# Patient Record
Sex: Female | Born: 1985 | Race: Black or African American | Hispanic: No | Smoking: Never smoker
Health system: Southern US, Community
[De-identification: ages and names within clinical notes are randomized; demographics above are authoritative.]

## PROBLEM LIST (undated history)

## (undated) DIAGNOSIS — R079 Chest pain, unspecified: Secondary | ICD-10-CM

## (undated) DIAGNOSIS — R569 Unspecified convulsions: Secondary | ICD-10-CM

## (undated) DIAGNOSIS — E669 Obesity, unspecified: Secondary | ICD-10-CM

## (undated) DIAGNOSIS — J45909 Unspecified asthma, uncomplicated: Secondary | ICD-10-CM

## (undated) DIAGNOSIS — I10 Essential (primary) hypertension: Secondary | ICD-10-CM

## (undated) DIAGNOSIS — F319 Bipolar disorder, unspecified: Secondary | ICD-10-CM

## (undated) DIAGNOSIS — F329 Major depressive disorder, single episode, unspecified: Secondary | ICD-10-CM

## (undated) DIAGNOSIS — I471 Supraventricular tachycardia, unspecified: Secondary | ICD-10-CM

## (undated) DIAGNOSIS — F32A Depression, unspecified: Secondary | ICD-10-CM

## (undated) DIAGNOSIS — M797 Fibromyalgia: Secondary | ICD-10-CM

## (undated) DIAGNOSIS — I2699 Other pulmonary embolism without acute cor pulmonale: Secondary | ICD-10-CM

## (undated) DIAGNOSIS — I509 Heart failure, unspecified: Secondary | ICD-10-CM

## (undated) DIAGNOSIS — R7303 Prediabetes: Secondary | ICD-10-CM

## (undated) DIAGNOSIS — F419 Anxiety disorder, unspecified: Secondary | ICD-10-CM

## (undated) DIAGNOSIS — M5416 Radiculopathy, lumbar region: Secondary | ICD-10-CM

## (undated) HISTORY — DX: Supraventricular tachycardia, unspecified: I47.10

## (undated) HISTORY — DX: Heart failure, unspecified: I50.9

## (undated) HISTORY — DX: Depression, unspecified: F32.A

## (undated) HISTORY — DX: Radiculopathy, lumbar region: M54.16

## (undated) HISTORY — DX: Obesity, unspecified: E66.9

## (undated) HISTORY — DX: Unspecified convulsions: R56.9

## (undated) HISTORY — DX: Fibromyalgia: M79.7

## (undated) HISTORY — DX: Bipolar disorder, unspecified: F31.9

## (undated) HISTORY — DX: Supraventricular tachycardia: I47.1

## (undated) HISTORY — DX: Anxiety disorder, unspecified: F41.9

## (undated) HISTORY — DX: Chest pain, unspecified: R07.9

## (undated) HISTORY — DX: Major depressive disorder, single episode, unspecified: F32.9

---
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
## 2009-01-20 ENCOUNTER — Emergency Department: Payer: Self-pay | Admitting: Emergency Medicine

## 2009-05-11 ENCOUNTER — Emergency Department: Payer: Self-pay | Admitting: Emergency Medicine

## 2009-07-13 DIAGNOSIS — I471 Supraventricular tachycardia: Secondary | ICD-10-CM | POA: Insufficient documentation

## 2009-07-18 ENCOUNTER — Emergency Department: Payer: Self-pay | Admitting: Emergency Medicine

## 2009-08-16 ENCOUNTER — Encounter: Payer: Self-pay | Admitting: Orthopaedic Surgery

## 2009-08-27 ENCOUNTER — Encounter: Payer: Self-pay | Admitting: Orthopaedic Surgery

## 2009-10-11 ENCOUNTER — Emergency Department: Payer: Self-pay | Admitting: Emergency Medicine

## 2009-10-22 ENCOUNTER — Emergency Department: Payer: Self-pay | Admitting: Emergency Medicine

## 2009-10-28 ENCOUNTER — Emergency Department: Payer: Self-pay | Admitting: Internal Medicine

## 2009-11-02 ENCOUNTER — Emergency Department: Payer: Self-pay | Admitting: Emergency Medicine

## 2009-11-19 ENCOUNTER — Observation Stay: Payer: Self-pay | Admitting: Specialist

## 2010-01-23 ENCOUNTER — Emergency Department: Payer: Self-pay | Admitting: Emergency Medicine

## 2010-03-25 ENCOUNTER — Emergency Department: Payer: Self-pay | Admitting: Emergency Medicine

## 2010-09-19 ENCOUNTER — Emergency Department: Payer: Self-pay | Admitting: Emergency Medicine

## 2011-03-24 ENCOUNTER — Emergency Department: Payer: Self-pay | Admitting: Emergency Medicine

## 2011-04-17 ENCOUNTER — Emergency Department: Payer: Self-pay | Admitting: Internal Medicine

## 2011-04-24 ENCOUNTER — Inpatient Hospital Stay: Payer: Self-pay | Admitting: Unknown Physician Specialty

## 2011-05-23 ENCOUNTER — Encounter: Payer: Self-pay | Admitting: Family Medicine

## 2011-05-28 ENCOUNTER — Encounter: Payer: Self-pay | Admitting: Family Medicine

## 2011-12-13 ENCOUNTER — Emergency Department: Payer: Self-pay | Admitting: Emergency Medicine

## 2011-12-19 ENCOUNTER — Emergency Department: Payer: Self-pay | Admitting: Emergency Medicine

## 2012-09-24 ENCOUNTER — Emergency Department: Payer: Self-pay | Admitting: Emergency Medicine

## 2013-01-15 ENCOUNTER — Emergency Department: Payer: Self-pay | Admitting: Internal Medicine

## 2013-05-09 ENCOUNTER — Ambulatory Visit: Payer: Self-pay | Admitting: Neurology

## 2013-05-22 ENCOUNTER — Ambulatory Visit: Payer: Self-pay | Admitting: Neurology

## 2013-06-13 ENCOUNTER — Emergency Department: Payer: Self-pay | Admitting: Emergency Medicine

## 2013-06-13 LAB — CBC
HCT: 39 % (ref 35.0–47.0)
HGB: 12.9 g/dL (ref 12.0–16.0)
MCH: 27.2 pg (ref 26.0–34.0)
MCHC: 33.1 g/dL (ref 32.0–36.0)
MCV: 82 fL (ref 80–100)
Platelet: 249 10*3/uL (ref 150–440)
RBC: 4.76 10*6/uL (ref 3.80–5.20)
RDW: 16.9 % — ABNORMAL HIGH (ref 11.5–14.5)
WBC: 7.6 10*3/uL (ref 3.6–11.0)

## 2013-06-13 LAB — BASIC METABOLIC PANEL
Anion Gap: 9 (ref 7–16)
BUN: 7 mg/dL (ref 7–18)
Calcium, Total: 9.7 mg/dL (ref 8.5–10.1)
Chloride: 104 mmol/L (ref 98–107)
Co2: 26 mmol/L (ref 21–32)
Creatinine: 0.78 mg/dL (ref 0.60–1.30)
EGFR (African American): >60
EGFR (Non-African Amer.): >60
Glucose: 161 mg/dL — ABNORMAL HIGH (ref 65–99)
Osmolality: 279 (ref 275–301)
Potassium: 3.7 mmol/L (ref 3.5–5.1)
Sodium: 139 mmol/L (ref 136–145)

## 2013-06-13 LAB — TROPONIN I: Troponin-I: <0.02 ng/mL

## 2013-06-14 LAB — TSH: Thyroid Stimulating Horm: 0.266 u[IU]/mL — ABNORMAL LOW

## 2014-04-10 ENCOUNTER — Emergency Department: Payer: Self-pay | Admitting: Emergency Medicine

## 2014-04-15 DIAGNOSIS — R519 Headache, unspecified: Secondary | ICD-10-CM | POA: Insufficient documentation

## 2014-04-15 DIAGNOSIS — G2581 Restless legs syndrome: Secondary | ICD-10-CM | POA: Insufficient documentation

## 2014-04-15 DIAGNOSIS — E669 Obesity, unspecified: Secondary | ICD-10-CM | POA: Insufficient documentation

## 2014-04-15 DIAGNOSIS — R51 Headache: Secondary | ICD-10-CM

## 2014-04-15 DIAGNOSIS — G479 Sleep disorder, unspecified: Secondary | ICD-10-CM | POA: Insufficient documentation

## 2014-04-15 DIAGNOSIS — M542 Cervicalgia: Secondary | ICD-10-CM | POA: Insufficient documentation

## 2014-04-27 DIAGNOSIS — G43909 Migraine, unspecified, not intractable, without status migrainosus: Secondary | ICD-10-CM | POA: Insufficient documentation

## 2014-04-27 DIAGNOSIS — M797 Fibromyalgia: Secondary | ICD-10-CM | POA: Insufficient documentation

## 2014-04-27 DIAGNOSIS — G4733 Obstructive sleep apnea (adult) (pediatric): Secondary | ICD-10-CM | POA: Insufficient documentation

## 2014-04-27 DIAGNOSIS — R569 Unspecified convulsions: Secondary | ICD-10-CM | POA: Insufficient documentation

## 2014-06-23 ENCOUNTER — Ambulatory Visit: Payer: Self-pay | Admitting: Family Medicine

## 2014-09-07 ENCOUNTER — Emergency Department: Payer: Self-pay | Admitting: Emergency Medicine

## 2014-09-07 LAB — CBC
HCT: 38.2 % (ref 35.0–47.0)
HGB: 12.3 g/dL (ref 12.0–16.0)
MCH: 27.4 pg (ref 26.0–34.0)
MCHC: 32.2 g/dL (ref 32.0–36.0)
MCV: 85 fL (ref 80–100)
Platelet: 241 10*3/uL (ref 150–440)
RBC: 4.5 10*6/uL (ref 3.80–5.20)
RDW: 13.8 % (ref 11.5–14.5)
WBC: 4.3 10*3/uL (ref 3.6–11.0)

## 2014-09-07 LAB — COMPREHENSIVE METABOLIC PANEL
Albumin: 3.5 g/dL (ref 3.4–5.0)
Alkaline Phosphatase: 58 U/L
Anion Gap: 8 (ref 7–16)
BUN: 9 mg/dL (ref 7–18)
Bilirubin,Total: 0.1 mg/dL — ABNORMAL LOW (ref 0.2–1.0)
Calcium, Total: 8.5 mg/dL (ref 8.5–10.1)
Chloride: 107 mmol/L (ref 98–107)
Co2: 27 mmol/L (ref 21–32)
Creatinine: 0.73 mg/dL (ref 0.60–1.30)
EGFR (African American): >60
EGFR (Non-African Amer.): >60
Glucose: 92 mg/dL (ref 65–99)
Osmolality: 281 (ref 275–301)
Potassium: 3.9 mmol/L (ref 3.5–5.1)
SGOT(AST): 11 U/L — ABNORMAL LOW (ref 15–37)
SGPT (ALT): 11 U/L — ABNORMAL LOW
Sodium: 142 mmol/L (ref 136–145)
Total Protein: 6.8 g/dL (ref 6.4–8.2)

## 2014-09-07 LAB — TROPONIN I: Troponin-I: <0.02 ng/mL

## 2014-09-09 ENCOUNTER — Emergency Department: Payer: Self-pay | Admitting: Emergency Medicine

## 2014-09-10 ENCOUNTER — Ambulatory Visit: Payer: Self-pay | Admitting: Family Medicine

## 2015-04-01 ENCOUNTER — Other Ambulatory Visit: Payer: Self-pay | Admitting: Physical Medicine and Rehabilitation

## 2015-04-01 DIAGNOSIS — M5416 Radiculopathy, lumbar region: Secondary | ICD-10-CM

## 2015-04-15 ENCOUNTER — Ambulatory Visit: Admission: RE | Admit: 2015-04-15 | Payer: Self-pay | Source: Ambulatory Visit

## 2015-04-29 ENCOUNTER — Other Ambulatory Visit: Payer: Self-pay | Admitting: Family Medicine

## 2015-04-29 ENCOUNTER — Ambulatory Visit
Admission: RE | Admit: 2015-04-29 | Discharge: 2015-04-29 | Disposition: A | Discharge status: Home / Self Care | Payer: Self-pay | Source: Ambulatory Visit | Attending: Family Medicine | Admitting: Family Medicine

## 2015-04-29 DIAGNOSIS — M25561 Pain in right knee: Secondary | ICD-10-CM | POA: Insufficient documentation

## 2015-04-29 DIAGNOSIS — M25569 Pain in unspecified knee: Secondary | ICD-10-CM

## 2015-05-12 ENCOUNTER — Ambulatory Visit: Payer: Self-pay | Attending: Family Medicine

## 2015-05-12 VITALS — BP 111/69 | HR 75

## 2015-05-12 DIAGNOSIS — M5416 Radiculopathy, lumbar region: Secondary | ICD-10-CM

## 2015-05-12 DIAGNOSIS — M5442 Lumbago with sciatica, left side: Secondary | ICD-10-CM

## 2015-05-12 DIAGNOSIS — R531 Weakness: Secondary | ICD-10-CM

## 2015-05-12 NOTE — Patient Instructions (Signed)
Per pt, she has access to a pool. Patient was instructed to walk forward for 15 minutes, sideways for 15 minutes, and backwards for 15 minutes in a pool and to perform comfortably.  Patient was also instructed to perform supine R hip extension isometrics (10x3 with 5 second holds); supine L hip flexion 10x3; and standing L lateral shift correction (R lateral shift correction was uncomfortable for pt) 5x5 seconds for 3 sets as part of her home exercise program. Patient demonstrated and verbalized understanding.   Patient to call back after 3 weeks to schedule a follow-up session if needed secondary to current insurance situation and possible out of pocket expenses (patient informed). Patient verbalized understanding.     Improved exercise technique, movement at target joints, use of target muscles after mod verbal, visual, tactile cues.
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
## 2015-05-18 ENCOUNTER — Ambulatory Visit: Payer: Self-pay

## 2015-05-18 ENCOUNTER — Ambulatory Visit
Admission: RE | Admit: 2015-05-18 | Discharge: 2015-05-18 | Disposition: A | Discharge status: Home / Self Care | Payer: Self-pay | Source: Ambulatory Visit | Attending: Physical Medicine and Rehabilitation | Admitting: Physical Medicine and Rehabilitation

## 2015-05-18 ENCOUNTER — Ambulatory Visit: Payer: Self-pay | Admitting: Physical Therapy

## 2015-05-18 DIAGNOSIS — M5416 Radiculopathy, lumbar region: Secondary | ICD-10-CM

## 2015-06-15 ENCOUNTER — Ambulatory Visit: Admission: RE | Admit: 2015-06-15 | Payer: Self-pay | Source: Ambulatory Visit

## 2015-07-13 ENCOUNTER — Ambulatory Visit
Admission: RE | Admit: 2015-07-13 | Discharge: 2015-07-13 | Disposition: A | Discharge status: Home / Self Care | Payer: Self-pay | Source: Ambulatory Visit | Attending: Physical Medicine and Rehabilitation | Admitting: Physical Medicine and Rehabilitation

## 2015-07-13 DIAGNOSIS — M5117 Intervertebral disc disorders with radiculopathy, lumbosacral region: Secondary | ICD-10-CM | POA: Insufficient documentation

## 2015-07-17 ENCOUNTER — Emergency Department
Admission: EM | Admit: 2015-07-17 | Discharge: 2015-07-17 | Disposition: A | Discharge status: Home / Self Care | Payer: Self-pay | Attending: Emergency Medicine | Admitting: Emergency Medicine

## 2015-07-17 DIAGNOSIS — Z791 Long term (current) use of non-steroidal anti-inflammatories (NSAID): Secondary | ICD-10-CM | POA: Insufficient documentation

## 2015-07-17 DIAGNOSIS — Z79899 Other long term (current) drug therapy: Secondary | ICD-10-CM | POA: Insufficient documentation

## 2015-07-17 DIAGNOSIS — M5416 Radiculopathy, lumbar region: Secondary | ICD-10-CM | POA: Insufficient documentation

## 2015-07-17 DIAGNOSIS — R200 Anesthesia of skin: Secondary | ICD-10-CM | POA: Insufficient documentation

## 2015-07-17 DIAGNOSIS — I10 Essential (primary) hypertension: Secondary | ICD-10-CM | POA: Insufficient documentation

## 2015-07-17 HISTORY — DX: Essential (primary) hypertension: I10

## 2015-07-17 MED ORDER — HYDROCODONE-ACETAMINOPHEN 5-325 MG PO TABS
1.0000 | ORAL_TABLET | ORAL | Status: AC
  Administered 2015-07-17: 1 via ORAL
  Filled 2015-07-17: qty 1

## 2015-07-17 MED ORDER — DEXAMETHASONE SODIUM PHOSPHATE 10 MG/ML IJ SOLN
10.0000 mg | Freq: Once | INTRAMUSCULAR | Status: AC
  Administered 2015-07-17: 10 mg via INTRAMUSCULAR
  Filled 2015-07-17: qty 1

## 2015-07-17 MED ORDER — PREDNISONE 10 MG PO TABS: 10.0000 mg | ORAL_TABLET | Freq: Every day | ORAL | Status: DC

## 2015-07-17 NOTE — Discharge Instructions (Signed)

## 2015-07-17 NOTE — ED Notes (Signed)
Pt states that her back pain has got worse today and goes down her legs. Pt had an MRI on Monday and states she has a pinched nerve.
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
## 2015-07-19 ENCOUNTER — Ambulatory Visit: Payer: Self-pay | Admitting: Physical Therapy

## 2015-07-21 ENCOUNTER — Ambulatory Visit: Payer: Self-pay | Admitting: Physical Therapy

## 2015-07-21 ENCOUNTER — Encounter: Payer: Self-pay | Admitting: Physical Therapy

## 2015-07-28 ENCOUNTER — Ambulatory Visit: Payer: Self-pay | Admitting: Physical Therapy

## 2015-07-28 ENCOUNTER — Encounter: Payer: Self-pay | Admitting: Physical Therapy

## 2015-07-30 ENCOUNTER — Encounter: Payer: Self-pay | Admitting: Physical Therapy

## 2015-08-03 ENCOUNTER — Encounter: Payer: Self-pay | Admitting: Physical Therapy

## 2015-08-05 ENCOUNTER — Ambulatory Visit: Payer: Self-pay | Attending: Family Medicine | Admitting: Physical Therapy

## 2015-08-11 ENCOUNTER — Encounter: Payer: Self-pay | Admitting: Physical Therapy

## 2015-08-18 ENCOUNTER — Encounter: Payer: Self-pay | Admitting: Physical Therapy

## 2015-08-21 ENCOUNTER — Encounter: Payer: Self-pay | Admitting: Emergency Medicine

## 2015-08-21 ENCOUNTER — Emergency Department
Admission: EM | Admit: 2015-08-21 | Discharge: 2015-08-21 | Disposition: A | Discharge status: Home / Self Care | Payer: Self-pay | Attending: Emergency Medicine | Admitting: Emergency Medicine

## 2015-08-21 DIAGNOSIS — I10 Essential (primary) hypertension: Secondary | ICD-10-CM | POA: Insufficient documentation

## 2015-08-21 DIAGNOSIS — Z791 Long term (current) use of non-steroidal anti-inflammatories (NSAID): Secondary | ICD-10-CM | POA: Insufficient documentation

## 2015-08-21 DIAGNOSIS — Z79899 Other long term (current) drug therapy: Secondary | ICD-10-CM | POA: Insufficient documentation

## 2015-08-21 DIAGNOSIS — Z7952 Long term (current) use of systemic steroids: Secondary | ICD-10-CM | POA: Insufficient documentation

## 2015-08-21 DIAGNOSIS — M545 Low back pain: Secondary | ICD-10-CM | POA: Insufficient documentation

## 2015-08-21 MED ORDER — TRAMADOL HCL 50 MG PO TABS: 50.0000 mg | ORAL_TABLET | Freq: Four times a day (QID) | ORAL | Status: DC | PRN

## 2015-08-21 MED ORDER — TRAMADOL HCL 50 MG PO TABS
ORAL_TABLET | ORAL | Status: AC
  Filled 2015-08-21: qty 1

## 2015-08-21 MED ORDER — NAPROXEN 500 MG PO TABS: 500.0000 mg | ORAL_TABLET | Freq: Two times a day (BID) | ORAL | Status: DC

## 2015-08-21 MED ORDER — TRAMADOL HCL 50 MG PO TABS
50.0000 mg | ORAL_TABLET | Freq: Once | ORAL | Status: AC
  Administered 2015-08-21: 50 mg via ORAL

## 2015-08-21 MED ORDER — ONDANSETRON HCL 4 MG PO TABS: 4.0000 mg | ORAL_TABLET | Freq: Every day | ORAL | Status: DC | PRN

## 2015-08-21 MED ORDER — ONDANSETRON 8 MG PO TBDP
8.0000 mg | ORAL_TABLET | Freq: Once | ORAL | Status: AC
  Administered 2015-08-21: 8 mg via ORAL

## 2015-08-21 MED ORDER — ONDANSETRON 8 MG PO TBDP
ORAL_TABLET | ORAL | Status: AC
  Administered 2015-08-21: 8 mg via ORAL
  Filled 2015-08-21: qty 1

## 2015-08-21 NOTE — Discharge Instructions (Signed)
Back Pain, Adult °Back pain is very common. The pain often gets better over time. The cause of back pain is usually not dangerous. Most people can learn to manage their back pain on their own.  °HOME CARE  °· Stay active. Start with short walks on flat ground if you can. Try to walk farther each day. °· Do not sit, drive, or stand in one place for more than 30 minutes. Do not stay in bed. °· Do not avoid exercise or work. Activity can help your back heal faster. °· Be careful when you bend or lift an object. Bend at your knees, keep the object close to you, and do not twist. °· Sleep on a firm mattress. Lie on your side, and bend your knees. If you lie on your back, put a pillow under your knees. °· Only take medicines as told by your doctor. °· Put ice on the injured area. °¨ Put ice in a plastic bag. °¨ Place a towel between your skin and the bag. °¨ Leave the ice on for 15-20 minutes, 03-04 times a day for the first 2 to 3 days. After that, you can switch between ice and heat packs. °· Ask your doctor about back exercises or massage. °· Avoid feeling anxious or stressed. Find good ways to deal with stress, such as exercise. °GET HELP RIGHT AWAY IF:  °· Your pain does not go away with rest or medicine. °· Your pain does not go away in 1 week. °· You have new problems. °· You do not feel well. °· The pain spreads into your legs. °· You cannot control when you poop (bowel movement) or pee (urinate). °· Your arms or legs feel weak or lose feeling (numbness). °· You feel sick to your stomach (nauseous) or throw up (vomit). °· You have belly (abdominal) pain. °· You feel like you may pass out (faint). °MAKE SURE YOU:  °· Understand these instructions. °· Will watch your condition. °· Will get help right away if you are not doing well or get worse. °Document Released: 05/01/2008 Document Revised: 02/05/2012 Document Reviewed: 03/17/2014 °[employer]® Patient Information ©2015 [employer]. This information is not intended  to replace advice given to you by your health care provider. Make sure you discuss any questions you have with your health care provider. ° °

## 2015-08-21 NOTE — ED Notes (Signed)
States has hisotry of "bulging disc", states had box fall on back 2 months ago and has had low back pain since, states has been evaluated for this in past
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
## 2015-10-16 ENCOUNTER — Emergency Department
Admission: EM | Admit: 2015-10-16 | Discharge: 2015-10-16 | Disposition: A | Discharge status: Home / Self Care | Payer: Self-pay | Attending: Emergency Medicine | Admitting: Emergency Medicine

## 2015-10-16 ENCOUNTER — Emergency Department: Payer: Self-pay

## 2015-10-16 ENCOUNTER — Encounter: Payer: Self-pay | Admitting: Emergency Medicine

## 2015-10-16 DIAGNOSIS — Z79899 Other long term (current) drug therapy: Secondary | ICD-10-CM | POA: Insufficient documentation

## 2015-10-16 DIAGNOSIS — Z3202 Encounter for pregnancy test, result negative: Secondary | ICD-10-CM | POA: Insufficient documentation

## 2015-10-16 DIAGNOSIS — R079 Chest pain, unspecified: Secondary | ICD-10-CM | POA: Insufficient documentation

## 2015-10-16 DIAGNOSIS — B349 Viral infection, unspecified: Secondary | ICD-10-CM

## 2015-10-16 DIAGNOSIS — J020 Streptococcal pharyngitis: Secondary | ICD-10-CM

## 2015-10-16 DIAGNOSIS — I10 Essential (primary) hypertension: Secondary | ICD-10-CM | POA: Insufficient documentation

## 2015-10-16 DIAGNOSIS — Z791 Long term (current) use of non-steroidal anti-inflammatories (NSAID): Secondary | ICD-10-CM | POA: Insufficient documentation

## 2015-10-16 LAB — CBC WITH DIFFERENTIAL/PLATELET
Basophils Absolute: 0.1 10*3/uL (ref 0–0.1)
Basophils Relative: 1 %
Eosinophils Absolute: 0 10*3/uL (ref 0–0.7)
Eosinophils Relative: 0 %
HCT: 38.9 % (ref 35.0–47.0)
Hemoglobin: 12.5 g/dL (ref 12.0–16.0)
Lymphocytes Relative: 19 %
Lymphs Abs: 2.4 10*3/uL (ref 1.0–3.6)
MCH: 26.2 pg (ref 26.0–34.0)
MCHC: 32 g/dL (ref 32.0–36.0)
MCV: 82 fL (ref 80.0–100.0)
Monocytes Absolute: 1.6 10*3/uL — ABNORMAL HIGH (ref 0.2–0.9)
Monocytes Relative: 13 %
Neutro Abs: 8.3 10*3/uL — ABNORMAL HIGH (ref 1.4–6.5)
Neutrophils Relative %: 67 %
Platelets: 227 10*3/uL (ref 150–440)
RBC: 4.75 MIL/uL (ref 3.80–5.20)
RDW: 13.7 % (ref 11.5–14.5)
WBC: 12.3 10*3/uL — ABNORMAL HIGH (ref 3.6–11.0)

## 2015-10-16 LAB — BASIC METABOLIC PANEL
Anion gap: 9 (ref 5–15)
BUN: 6 mg/dL (ref 6–20)
CO2: 27 mmol/L (ref 22–32)
Calcium: 9.4 mg/dL (ref 8.9–10.3)
Chloride: 99 mmol/L — ABNORMAL LOW (ref 101–111)
Creatinine, Ser: 0.76 mg/dL (ref 0.44–1.00)
GFR calc Af Amer: >60 mL/min (ref >60)
GFR calc non Af Amer: >60 mL/min (ref >60)
Glucose, Bld: 115 mg/dL — ABNORMAL HIGH (ref 65–99)
Potassium: 3.6 mmol/L (ref 3.5–5.1)
Sodium: 135 mmol/L (ref 135–145)

## 2015-10-16 LAB — URINALYSIS COMPLETE WITH MICROSCOPIC (ARMC ONLY)
Bilirubin Urine: NEGATIVE
Glucose, UA: NEGATIVE mg/dL
Hgb urine dipstick: NEGATIVE
Leukocytes, UA: NEGATIVE
Nitrite: NEGATIVE
Protein, ur: NEGATIVE mg/dL
Specific Gravity, Urine: 1.004 — ABNORMAL LOW (ref 1.005–1.030)
pH: 6 (ref 5.0–8.0)

## 2015-10-16 LAB — MONONUCLEOSIS SCREEN: Mono Screen: NEGATIVE

## 2015-10-16 LAB — FIBRIN DERIVATIVES D-DIMER (ARMC ONLY): Fibrin derivatives D-dimer (ARMC): 565 — ABNORMAL HIGH (ref 0–499)

## 2015-10-16 LAB — POCT PREGNANCY, URINE: Preg Test, Ur: NEGATIVE

## 2015-10-16 LAB — TROPONIN I: Troponin I: <0.03 ng/mL (ref <0.031)

## 2015-10-16 MED ORDER — ONDANSETRON HCL 4 MG/2ML IJ SOLN
INTRAMUSCULAR | Status: AC
  Filled 2015-10-16: qty 2

## 2015-10-16 MED ORDER — IOHEXOL 350 MG/ML SOLN
80.0000 mL | Freq: Once | INTRAVENOUS | Status: AC | PRN
  Administered 2015-10-16: 80 mL via INTRAVENOUS

## 2015-10-16 MED ORDER — ONDANSETRON HCL 4 MG/2ML IJ SOLN
4.0000 mg | Freq: Once | INTRAMUSCULAR | Status: AC
  Administered 2015-10-16: 4 mg via INTRAVENOUS
  Filled 2015-10-16: qty 2

## 2015-10-16 MED ORDER — METOCLOPRAMIDE HCL 10 MG PO TABS: 10.0000 mg | ORAL_TABLET | Freq: Four times a day (QID) | ORAL | Status: DC | PRN

## 2015-10-16 MED ORDER — KETOROLAC TROMETHAMINE 30 MG/ML IJ SOLN
30.0000 mg | Freq: Once | INTRAMUSCULAR | Status: AC
  Administered 2015-10-16: 30 mg via INTRAVENOUS
  Filled 2015-10-16: qty 1

## 2015-10-16 MED ORDER — SODIUM CHLORIDE 0.9 % IV BOLUS (SEPSIS)
1000.0000 mL | Freq: Once | INTRAVENOUS | Status: AC
  Administered 2015-10-16: 1000 mL via INTRAVENOUS

## 2015-10-16 MED ORDER — IOHEXOL 350 MG/ML SOLN
100.0000 mL | Freq: Once | INTRAVENOUS | Status: AC | PRN
  Administered 2015-10-16: 80 mL via INTRAVENOUS

## 2015-10-16 MED ORDER — PENICILLIN G PROCAINE 600000 UNIT/ML IM SUSP
INTRAMUSCULAR | Status: AC
  Filled 2015-10-16: qty 1

## 2015-10-16 MED ORDER — PENICILLIN G BENZATHINE 1200000 UNIT/2ML IM SUSP
INTRAMUSCULAR | Status: AC
  Filled 2015-10-16: qty 4

## 2015-10-16 MED ORDER — ACETAMINOPHEN 325 MG PO TABS
650.0000 mg | ORAL_TABLET | Freq: Once | ORAL | Status: AC
  Administered 2015-10-16: 650 mg via ORAL
  Filled 2015-10-16: qty 2

## 2015-10-16 MED ORDER — PENICILLIN G BENZATHINE 1200000 UNIT/2ML IM SUSP
1.2000 10*6.[IU] | Freq: Once | INTRAMUSCULAR | Status: AC
Start: 2015-10-16 — End: 2015-10-16
  Administered 2015-10-16: 1.2 10*6.[IU] via INTRAMUSCULAR

## 2015-10-16 MED ORDER — ONDANSETRON HCL 4 MG/2ML IJ SOLN
4.0000 mg | Freq: Once | INTRAMUSCULAR | Status: AC
  Administered 2015-10-16: 4 mg via INTRAVENOUS

## 2015-10-16 NOTE — Discharge Instructions (Signed)

## 2015-10-16 NOTE — ED Notes (Signed)
Pt verbalized understanding of discharge orders. NAD at this time.  

## 2015-10-16 NOTE — ED Notes (Signed)
Pt to ed with c/o body aches, headache, runny nose, congestion, cough x 2 days.  Pt reports feeling dizzy, weak and nauseated.
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
## 2015-10-16 NOTE — ED Notes (Signed)
Lab notified to add d dimer 

## 2015-10-18 ENCOUNTER — Emergency Department
Admission: EM | Admit: 2015-10-18 | Discharge: 2015-10-18 | Disposition: A | Discharge status: Home / Self Care | Payer: Self-pay | Attending: Emergency Medicine | Admitting: Emergency Medicine

## 2015-10-18 ENCOUNTER — Emergency Department: Payer: Self-pay

## 2015-10-18 ENCOUNTER — Encounter: Payer: Self-pay | Admitting: Emergency Medicine

## 2015-10-18 DIAGNOSIS — J028 Acute pharyngitis due to other specified organisms: Secondary | ICD-10-CM | POA: Insufficient documentation

## 2015-10-18 DIAGNOSIS — Z7951 Long term (current) use of inhaled steroids: Secondary | ICD-10-CM | POA: Insufficient documentation

## 2015-10-18 DIAGNOSIS — I10 Essential (primary) hypertension: Secondary | ICD-10-CM | POA: Insufficient documentation

## 2015-10-18 DIAGNOSIS — B9789 Other viral agents as the cause of diseases classified elsewhere: Secondary | ICD-10-CM | POA: Insufficient documentation

## 2015-10-18 DIAGNOSIS — Z791 Long term (current) use of non-steroidal anti-inflammatories (NSAID): Secondary | ICD-10-CM | POA: Insufficient documentation

## 2015-10-18 DIAGNOSIS — Z79899 Other long term (current) drug therapy: Secondary | ICD-10-CM | POA: Insufficient documentation

## 2015-10-18 DIAGNOSIS — J029 Acute pharyngitis, unspecified: Secondary | ICD-10-CM

## 2015-10-18 DIAGNOSIS — Z3202 Encounter for pregnancy test, result negative: Secondary | ICD-10-CM | POA: Insufficient documentation

## 2015-10-18 LAB — COMPREHENSIVE METABOLIC PANEL
ALT: 8 U/L — ABNORMAL LOW (ref 14–54)
AST: 14 U/L — ABNORMAL LOW (ref 15–41)
Albumin: 3.6 g/dL (ref 3.5–5.0)
Alkaline Phosphatase: 63 U/L (ref 38–126)
Anion gap: 6 (ref 5–15)
BUN: <5 mg/dL — ABNORMAL LOW (ref 6–20)
CO2: 31 mmol/L (ref 22–32)
Calcium: 9.4 mg/dL (ref 8.9–10.3)
Chloride: 100 mmol/L — ABNORMAL LOW (ref 101–111)
Creatinine, Ser: 0.6 mg/dL (ref 0.44–1.00)
GFR calc Af Amer: >60 mL/min (ref >60)
GFR calc non Af Amer: >60 mL/min (ref >60)
Glucose, Bld: 106 mg/dL — ABNORMAL HIGH (ref 65–99)
Potassium: 4 mmol/L (ref 3.5–5.1)
Sodium: 137 mmol/L (ref 135–145)
Total Bilirubin: 0.3 mg/dL (ref 0.3–1.2)
Total Protein: 7.9 g/dL (ref 6.5–8.1)

## 2015-10-18 LAB — URINALYSIS COMPLETE WITH MICROSCOPIC (ARMC ONLY)
Bilirubin Urine: NEGATIVE
Glucose, UA: NEGATIVE mg/dL
Hgb urine dipstick: NEGATIVE
Ketones, ur: NEGATIVE mg/dL
Leukocytes, UA: NEGATIVE
Nitrite: NEGATIVE
Protein, ur: NEGATIVE mg/dL
Specific Gravity, Urine: 1.013 (ref 1.005–1.030)
pH: 7 (ref 5.0–8.0)

## 2015-10-18 LAB — CBC
HCT: 37.6 % (ref 35.0–47.0)
Hemoglobin: 12.3 g/dL (ref 12.0–16.0)
MCH: 26.9 pg (ref 26.0–34.0)
MCHC: 32.6 g/dL (ref 32.0–36.0)
MCV: 82.5 fL (ref 80.0–100.0)
Platelets: 233 10*3/uL (ref 150–440)
RBC: 4.56 MIL/uL (ref 3.80–5.20)
RDW: 13.6 % (ref 11.5–14.5)
WBC: 7.8 10*3/uL (ref 3.6–11.0)

## 2015-10-18 LAB — POCT PREGNANCY, URINE: Preg Test, Ur: NEGATIVE

## 2015-10-18 LAB — POCT RAPID STREP A: Streptococcus, Group A Screen (Direct): NEGATIVE

## 2015-10-18 LAB — INFLUENZA PANEL BY PCR (TYPE A & B)
H1N1 flu by pcr: NOT DETECTED
Influenza A By PCR: NEGATIVE
Influenza B By PCR: NEGATIVE

## 2015-10-18 LAB — LIPASE, BLOOD: Lipase: 17 U/L (ref 11–51)

## 2015-10-18 MED ORDER — SODIUM CHLORIDE 0.9 % IV BOLUS (SEPSIS)
1000.0000 mL | Freq: Once | INTRAVENOUS | Status: AC
Start: 2015-10-18 — End: 2015-10-18
  Administered 2015-10-18: 1000 mL via INTRAVENOUS

## 2015-10-18 MED ORDER — ONDANSETRON HCL 4 MG/2ML IJ SOLN
4.0000 mg | Freq: Once | INTRAMUSCULAR | Status: AC | PRN
  Administered 2015-10-18: 4 mg via INTRAVENOUS
  Filled 2015-10-18: qty 2

## 2015-10-18 MED ORDER — ACETAMINOPHEN-CODEINE 120-12 MG/5ML PO SUSP: 10.0000 mL | Freq: Four times a day (QID) | ORAL | Status: DC | PRN

## 2015-10-18 MED ORDER — SODIUM CHLORIDE 0.9 % IV SOLN
1000.0000 mL | Freq: Once | INTRAVENOUS | Status: AC
  Administered 2015-10-18: 1000 mL via INTRAVENOUS

## 2015-10-18 MED ORDER — AMOXICILLIN-POT CLAVULANATE 875-125 MG PO TABS: 1.0000 | ORAL_TABLET | Freq: Two times a day (BID) | ORAL | Status: AC

## 2015-10-18 MED ORDER — ACETAMINOPHEN 325 MG PO TABS
650.0000 mg | ORAL_TABLET | Freq: Once | ORAL | Status: AC
  Administered 2015-10-18: 650 mg via ORAL
  Filled 2015-10-18: qty 2

## 2015-10-18 NOTE — Discharge Instructions (Signed)

## 2015-10-18 NOTE — ED Notes (Signed)
Computer down during discharge. Unable to obtain signature. Pt given verbal and written discharge instructions. Verbalized understanding.
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
## 2015-10-18 NOTE — ED Notes (Signed)
Patient seen here on Sat., returns today complaining of feeling worse. Started with left side pain 4-5 days ago, sore throat, body aches, head congestion, chest pain. Voice sounds thick and congested.

## 2015-10-18 NOTE — ED Notes (Signed)
Pt transported to xray at this time

## 2015-10-20 LAB — CULTURE, GROUP A STREP (THRC)

## 2015-12-27 ENCOUNTER — Emergency Department
Admission: EM | Admit: 2015-12-27 | Discharge: 2015-12-27 | Disposition: A | Discharge status: Home / Self Care | Payer: Self-pay | Attending: Emergency Medicine | Admitting: Emergency Medicine

## 2015-12-27 ENCOUNTER — Encounter: Payer: Self-pay | Admitting: Emergency Medicine

## 2015-12-27 NOTE — ED Notes (Signed)
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 Pt "shaking" during triage, when placed on spo2 monitor, right hand was shaking, left was still, switched monitor to left hand, right hand became still, left hand began shaking. Pt asked if she is here to see a psych MD, pt states she just needs medical Dr. Denies si/hi.

## 2015-12-27 NOTE — ED Notes (Signed)
Pt states has history of anxiety and panic attacks. Pt states her anxiety has increased over the last two weeks. Denies any known causes. States no new stressors in her life. States she has been out of her prescribed anti-anxiety medications because she is out of insurance.

## 2015-12-27 NOTE — ED Notes (Signed)
Pt states she has had anxiety for a while, here for worsening panic attacks over the past 2 weeks, pt appears in no distress.

## 2016-02-13 ENCOUNTER — Emergency Department
Admission: EM | Admit: 2016-02-13 | Discharge: 2016-02-13 | Disposition: A | Discharge status: Home / Self Care | Payer: Self-pay | Attending: Emergency Medicine | Admitting: Emergency Medicine

## 2016-02-13 ENCOUNTER — Encounter: Payer: Self-pay | Admitting: Emergency Medicine

## 2016-02-13 DIAGNOSIS — Z79899 Other long term (current) drug therapy: Secondary | ICD-10-CM | POA: Insufficient documentation

## 2016-02-13 DIAGNOSIS — S0006XA Insect bite (nonvenomous) of scalp, initial encounter: Secondary | ICD-10-CM | POA: Insufficient documentation

## 2016-02-13 DIAGNOSIS — R569 Unspecified convulsions: Secondary | ICD-10-CM | POA: Insufficient documentation

## 2016-02-13 DIAGNOSIS — Y999 Unspecified external cause status: Secondary | ICD-10-CM | POA: Insufficient documentation

## 2016-02-13 DIAGNOSIS — Z791 Long term (current) use of non-steroidal anti-inflammatories (NSAID): Secondary | ICD-10-CM | POA: Insufficient documentation

## 2016-02-13 DIAGNOSIS — W57XXXA Bitten or stung by nonvenomous insect and other nonvenomous arthropods, initial encounter: Secondary | ICD-10-CM | POA: Insufficient documentation

## 2016-02-13 DIAGNOSIS — I471 Supraventricular tachycardia: Secondary | ICD-10-CM | POA: Insufficient documentation

## 2016-02-13 DIAGNOSIS — Y9259 Other trade areas as the place of occurrence of the external cause: Secondary | ICD-10-CM | POA: Insufficient documentation

## 2016-02-13 DIAGNOSIS — I10 Essential (primary) hypertension: Secondary | ICD-10-CM | POA: Insufficient documentation

## 2016-02-13 DIAGNOSIS — S0086XA Insect bite (nonvenomous) of other part of head, initial encounter: Secondary | ICD-10-CM | POA: Insufficient documentation

## 2016-02-13 DIAGNOSIS — F329 Major depressive disorder, single episode, unspecified: Secondary | ICD-10-CM | POA: Insufficient documentation

## 2016-02-13 DIAGNOSIS — Y939 Activity, unspecified: Secondary | ICD-10-CM | POA: Insufficient documentation

## 2016-02-13 DIAGNOSIS — Z7952 Long term (current) use of systemic steroids: Secondary | ICD-10-CM | POA: Insufficient documentation

## 2016-02-13 MED ORDER — DIPHENHYDRAMINE-ZINC ACETATE 2-0.1 % EX STCK: CUTANEOUS | Status: DC

## 2016-02-13 NOTE — ED Notes (Signed)
Pt complains of itching to hairline and forehead. Small red bump noted along right lateral hairline during triage.

## 2016-02-13 NOTE — ED Notes (Signed)
Pt states was exposed to bed bugs and now has itching to face. Pt in no acute distress. resps unlabored.
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
## 2016-03-08 ENCOUNTER — Emergency Department
Admission: EM | Admit: 2016-03-08 | Discharge: 2016-03-08 | Disposition: A | Discharge status: Home / Self Care | Payer: Self-pay | Attending: Emergency Medicine | Admitting: Emergency Medicine

## 2016-03-08 ENCOUNTER — Emergency Department: Payer: Self-pay

## 2016-03-08 ENCOUNTER — Encounter: Payer: Self-pay | Admitting: *Deleted

## 2016-03-08 DIAGNOSIS — S46911A Strain of unspecified muscle, fascia and tendon at shoulder and upper arm level, right arm, initial encounter: Secondary | ICD-10-CM | POA: Insufficient documentation

## 2016-03-08 DIAGNOSIS — X58XXXA Exposure to other specified factors, initial encounter: Secondary | ICD-10-CM | POA: Insufficient documentation

## 2016-03-08 DIAGNOSIS — T148XXA Other injury of unspecified body region, initial encounter: Secondary | ICD-10-CM

## 2016-03-08 DIAGNOSIS — F329 Major depressive disorder, single episode, unspecified: Secondary | ICD-10-CM | POA: Insufficient documentation

## 2016-03-08 DIAGNOSIS — R0789 Other chest pain: Secondary | ICD-10-CM | POA: Insufficient documentation

## 2016-03-08 DIAGNOSIS — I10 Essential (primary) hypertension: Secondary | ICD-10-CM | POA: Insufficient documentation

## 2016-03-08 DIAGNOSIS — Y939 Activity, unspecified: Secondary | ICD-10-CM | POA: Insufficient documentation

## 2016-03-08 DIAGNOSIS — Z7952 Long term (current) use of systemic steroids: Secondary | ICD-10-CM | POA: Insufficient documentation

## 2016-03-08 DIAGNOSIS — Y929 Unspecified place or not applicable: Secondary | ICD-10-CM | POA: Insufficient documentation

## 2016-03-08 DIAGNOSIS — Z79899 Other long term (current) drug therapy: Secondary | ICD-10-CM | POA: Insufficient documentation

## 2016-03-08 DIAGNOSIS — Z7951 Long term (current) use of inhaled steroids: Secondary | ICD-10-CM | POA: Insufficient documentation

## 2016-03-08 DIAGNOSIS — Y999 Unspecified external cause status: Secondary | ICD-10-CM | POA: Insufficient documentation

## 2016-03-08 DIAGNOSIS — Z791 Long term (current) use of non-steroidal anti-inflammatories (NSAID): Secondary | ICD-10-CM | POA: Insufficient documentation

## 2016-03-08 DIAGNOSIS — M25511 Pain in right shoulder: Secondary | ICD-10-CM

## 2016-03-08 DIAGNOSIS — E669 Obesity, unspecified: Secondary | ICD-10-CM | POA: Insufficient documentation

## 2016-03-08 LAB — CBC
HCT: 37.4 % (ref 35.0–47.0)
Hemoglobin: 12.2 g/dL (ref 12.0–16.0)
MCH: 26.7 pg (ref 26.0–34.0)
MCHC: 32.6 g/dL (ref 32.0–36.0)
MCV: 81.7 fL (ref 80.0–100.0)
Platelets: 237 10*3/uL (ref 150–440)
RBC: 4.57 MIL/uL (ref 3.80–5.20)
RDW: 14.2 % (ref 11.5–14.5)
WBC: 4.7 10*3/uL (ref 3.6–11.0)

## 2016-03-08 LAB — BASIC METABOLIC PANEL
Anion gap: 5 (ref 5–15)
BUN: 7 mg/dL (ref 6–20)
CO2: 28 mmol/L (ref 22–32)
Calcium: 9.2 mg/dL (ref 8.9–10.3)
Chloride: 104 mmol/L (ref 101–111)
Creatinine, Ser: 0.59 mg/dL (ref 0.44–1.00)
GFR calc Af Amer: >60 mL/min (ref >60)
GFR calc non Af Amer: >60 mL/min (ref >60)
Glucose, Bld: 105 mg/dL — ABNORMAL HIGH (ref 65–99)
Potassium: 3.2 mmol/L — ABNORMAL LOW (ref 3.5–5.1)
Sodium: 137 mmol/L (ref 135–145)

## 2016-03-08 LAB — TROPONIN I: Troponin I: <0.03 ng/mL (ref <0.031)
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
## 2016-03-08 NOTE — ED Notes (Signed)
Md [place] Cerise at bedside

## 2016-03-08 NOTE — Discharge Instructions (Signed)
As we discussed, your workup today was reassuring.  Though we do not know exactly what is causing your symptoms, it appears that you have no emergent medical condition at this time are safe to go home and follow up as recommended in this paperwork.  It is most likely that you are suffering from musculoskeletal pain (muscle strain), and we encourage you to continue using your regular medications including your meloxicam.  Read through the included information for more recommendations.  Please return immediately to the Emergency Department if you develop any new or worsening symptoms that concern you.   Chest Wall Pain Chest wall pain is pain in or around the bones and muscles of your chest. Sometimes, an injury causes this pain. Sometimes, the cause may not be known. This pain may take several weeks or longer to get better. HOME CARE Pay attention to any changes in your symptoms. Take these actions to help with your pain:  Rest as told by your doctor.  Avoid activities that cause pain. Try not to use your chest, belly (abdominal), or side muscles to lift heavy things.  If directed, apply ice to the painful area:  Put ice in a plastic bag.  Place a towel between your skin and the bag.  Leave the ice on for 20 minutes, 2-3 times per day.  Take over-the-counter and prescription medicines only as told by your doctor.  Do not use tobacco products, including cigarettes, chewing tobacco, and e-cigarettes. If you need help quitting, ask your doctor.  Keep all follow-up visits as told by your doctor. This is important. GET HELP IF:  You have a fever.  Your chest pain gets worse.  You have new symptoms. GET HELP RIGHT AWAY IF:  You feel sick to your stomach (nauseous) or you throw up (vomit).  You feel sweaty or light-headed.  You have a cough with phlegm (sputum) or you cough up blood.  You are short of breath.   This information is not intended to replace advice given to you by  your health care provider. Make sure you discuss any questions you have with your health care provider.   Document Released: 05/01/2008 Document Revised: 08/04/2015 Document Reviewed: 02/08/2015 Elsevier Interactive Patient Education 2016 Else[employer] Inc[employer]   Shoulder Pain The shoulder is the joint that connects your arm to your body. Muscles and band-like tissues that connect bones to muscles (tendons) hold the joint together. Shoulder pain is felt if an injury or medical problem affects one or more parts of the shoulder. HOME CARE   Put ice on the sore area.  Put ice in a plastic bag.  Place a towel between your skin and the bag.  Leave the ice on for 15-20 minutes, 03-04 times a day for the first 2 days.  Stop using cold packs if they do not help with the pain.  If you were given something to keep your shoulder from moving (sling; shoulder immobilizer), wear it as told. Only take it off to shower or bathe.  Move your arm as little as possible, but keep your hand moving to prevent puffiness (swelling).  Squeeze a soft ball or foam pad as much as possible to help prevent swelling.  Take medicine as told by your doctor. GET HELP IF:  You have progressing new pain in your arm, hand, or fingers.  Your hand or fingers get cold.  Your medicine does not help lessen your pain. GET HELP RIGHT AWAY IF:   Your arm, hand, or  fingers are numb or tingling.  Your arm, hand, or fingers are puffy (swollen), painful, or turn white or blue. MAKE SURE YOU:   Understand these instructions.  Will watch your condition.  Will get help right away if you are not doing well or get worse.   This information is not intended to replace advice given to you by your health care provider. Make sure you discuss any questions you have with your health care provider.   Document Released: 05/01/2008 Document Revised: 12/04/2014 Document Reviewed: 03/08/2015 Elsevier Interactive Patient Education 2016  Elsevier Inc.  [employer] Therapy Heat therapy can help ease sore, stiff, injured, and tight muscles and joints. Heat relaxes your muscles, which may help ease your pain.  RISKS AND COMPLICATIONS If you have any of the following conditions, do not use heat therapy unless your health care provider has approved:  Poor circulation.  Healing wounds or scarred skin in the area being treated.  Diabetes, heart disease, or high blood pressure.  Not being able to feel (numbness) the area being treated.  Unusual swelling of the area being treated.  Active infections.  Blood clots.  Cancer.  Inability to communicate pain. This may include young children and people who have problems with their brain function (dementia).  Pregnancy. Heat therapy should only be used on old, pre-existing, or long-lasting (chronic) injuries. Do not use heat therapy on new injuries unless directed by your health care provider. HOW TO USE HEAT THERAPY There are several different kinds of heat therapy, including:  Moist heat pack.  Warm water bath.  Hot water bottle.  Electric heating pad.  Heated gel pack.  Heated wrap.  Electric heating pad. Use the heat therapy method suggested by your health care provider. Follow your health care provider's instructions on when and how to use heat therapy. GENERAL HEAT THERAPY RECOMMENDATIONS  Do not sleep while using heat therapy. Only use heat therapy while you are awake.  Your skin may turn pink while using heat therapy. Do not use heat therapy if your skin turns red.  Do not use heat therapy if you have new pain.  High heat or long exposure to heat can cause burns. Be careful when using heat therapy to avoid burning your skin.  Do not use heat therapy on areas of your skin that are already irritated, such as with a rash or sunburn. SEEK MEDICAL CARE IF:  You have blisters, redness, swelling, or numbness.  You have new pain.  Your pain is worse. MAKE SURE  YOU:  Understand these instructions.  Will watch your condition.  Will get help right away if you are not doing well or get worse.   This information is not intended to replace advice given to you by your health care provider. Make sure you discuss any questions you have with your health care provider.   Document Released: 02/05/2012 Document Revised: 12/04/2014 Document Reviewed: 01/06/2014 Elsevier Interactive Patient Education [employer]

## 2016-03-08 NOTE — ED Notes (Signed)
Pt transported to Xray at this time. Pt stable NAD noted.

## 2016-03-08 NOTE — ED Notes (Addendum)
States generalized body aches, states lower back pain with a hx of bulging disc, states right shoulder muscle spasms and then pt also states some chest pain with SOB and weakness for about 3 days, pt sees cardio for hx of tachycardia, pt on fluid pills, metoprolol and cardizem

## 2016-03-08 NOTE — ED Notes (Signed)
Pt states "pain all over, head/chest/shoulders/back/legs" Pt states hx of fibromyalgia, denies any recent illness or sickness. Denies any injuries or traumas. Pt AAOx4, vitals stable, NAD

## 2016-03-20 ENCOUNTER — Ambulatory Visit: Payer: BLUE CROSS/BLUE SHIELD | Admitting: Physical Therapy

## 2016-03-21 ENCOUNTER — Ambulatory Visit: Payer: BLUE CROSS/BLUE SHIELD | Admitting: Physical Therapy

## 2016-04-01 ENCOUNTER — Emergency Department (HOSPITAL_COMMUNITY): Payer: BLUE CROSS/BLUE SHIELD

## 2016-04-01 ENCOUNTER — Encounter (HOSPITAL_COMMUNITY): Payer: Self-pay | Admitting: Emergency Medicine

## 2016-04-01 ENCOUNTER — Emergency Department (HOSPITAL_COMMUNITY)
Admission: EM | Admit: 2016-04-01 | Discharge: 2016-04-01 | Disposition: A | Discharge status: Home / Self Care | Payer: BLUE CROSS/BLUE SHIELD | Attending: Emergency Medicine | Admitting: Emergency Medicine

## 2016-04-01 DIAGNOSIS — I10 Essential (primary) hypertension: Secondary | ICD-10-CM | POA: Insufficient documentation

## 2016-04-01 DIAGNOSIS — Z3202 Encounter for pregnancy test, result negative: Secondary | ICD-10-CM | POA: Insufficient documentation

## 2016-04-01 DIAGNOSIS — F319 Bipolar disorder, unspecified: Secondary | ICD-10-CM | POA: Insufficient documentation

## 2016-04-01 DIAGNOSIS — R079 Chest pain, unspecified: Secondary | ICD-10-CM | POA: Insufficient documentation

## 2016-04-01 DIAGNOSIS — E663 Overweight: Secondary | ICD-10-CM | POA: Insufficient documentation

## 2016-04-01 DIAGNOSIS — R112 Nausea with vomiting, unspecified: Secondary | ICD-10-CM | POA: Insufficient documentation

## 2016-04-01 DIAGNOSIS — F419 Anxiety disorder, unspecified: Secondary | ICD-10-CM | POA: Insufficient documentation

## 2016-04-01 DIAGNOSIS — I471 Supraventricular tachycardia: Secondary | ICD-10-CM | POA: Insufficient documentation

## 2016-04-01 DIAGNOSIS — Z8739 Personal history of other diseases of the musculoskeletal system and connective tissue: Secondary | ICD-10-CM | POA: Insufficient documentation

## 2016-04-01 DIAGNOSIS — Z79899 Other long term (current) drug therapy: Secondary | ICD-10-CM | POA: Insufficient documentation

## 2016-04-01 DIAGNOSIS — Z880 Allergy status to penicillin: Secondary | ICD-10-CM | POA: Insufficient documentation

## 2016-04-01 DIAGNOSIS — Z7951 Long term (current) use of inhaled steroids: Secondary | ICD-10-CM | POA: Insufficient documentation

## 2016-04-01 DIAGNOSIS — Z791 Long term (current) use of non-steroidal anti-inflammatories (NSAID): Secondary | ICD-10-CM | POA: Insufficient documentation

## 2016-04-01 LAB — CBC
HCT: 37.4 % (ref 36.0–46.0)
Hemoglobin: 12.6 g/dL (ref 12.0–15.0)
MCH: 27.3 pg (ref 26.0–34.0)
MCHC: 33.7 g/dL (ref 30.0–36.0)
MCV: 81 fL (ref 78.0–100.0)
Platelets: 226 10*3/uL (ref 150–400)
RBC: 4.62 MIL/uL (ref 3.87–5.11)
RDW: 14.7 % (ref 11.5–15.5)
WBC: 5.9 10*3/uL (ref 4.0–10.5)

## 2016-04-01 LAB — BASIC METABOLIC PANEL
Anion gap: 9 (ref 5–15)
BUN: 10 mg/dL (ref 6–20)
CO2: 26 mmol/L (ref 22–32)
Calcium: 9.7 mg/dL (ref 8.9–10.3)
Chloride: 106 mmol/L (ref 101–111)
Creatinine, Ser: 0.61 mg/dL (ref 0.44–1.00)
GFR calc Af Amer: >60 mL/min (ref >60)
GFR calc non Af Amer: >60 mL/min (ref >60)
Glucose, Bld: 118 mg/dL — ABNORMAL HIGH (ref 65–99)
Potassium: 4 mmol/L (ref 3.5–5.1)
Sodium: 141 mmol/L (ref 135–145)

## 2016-04-01 LAB — HEPATIC FUNCTION PANEL
ALT: 10 U/L — ABNORMAL LOW (ref 14–54)
AST: 15 U/L (ref 15–41)
Albumin: 3.8 g/dL (ref 3.5–5.0)
Alkaline Phosphatase: 68 U/L (ref 38–126)
Bilirubin, Direct: <0.1 mg/dL — ABNORMAL LOW (ref 0.1–0.5)
Total Bilirubin: 0.6 mg/dL (ref 0.3–1.2)
Total Protein: 6.9 g/dL (ref 6.5–8.1)

## 2016-04-01 LAB — I-STAT TROPONIN, ED: Troponin i, poc: 0.01 ng/mL (ref 0.00–0.08)

## 2016-04-01 LAB — LIPASE, BLOOD: Lipase: 21 U/L (ref 11–51)

## 2016-04-01 LAB — I-STAT BETA HCG BLOOD, ED (MC, WL, AP ONLY): I-stat hCG, quantitative: <5 m[IU]/mL (ref <5)

## 2016-04-01 MED ORDER — MORPHINE SULFATE (PF) 4 MG/ML IV SOLN
4.0000 mg | INTRAVENOUS | Status: DC | PRN
  Administered 2016-04-01: 4 mg via INTRAVENOUS
  Filled 2016-04-01: qty 1

## 2016-04-01 MED ORDER — SODIUM CHLORIDE 0.9 % IV BOLUS (SEPSIS)
1000.0000 mL | Freq: Once | INTRAVENOUS | Status: AC
  Administered 2016-04-01: 1000 mL via INTRAVENOUS

## 2016-04-01 MED ORDER — PROMETHAZINE HCL 25 MG PO TABS: 25.0000 mg | ORAL_TABLET | Freq: Four times a day (QID) | ORAL | Status: DC | PRN

## 2016-04-01 MED ORDER — ONDANSETRON HCL 4 MG/2ML IJ SOLN
4.0000 mg | Freq: Once | INTRAMUSCULAR | Status: AC
  Administered 2016-04-01: 4 mg via INTRAVENOUS
  Filled 2016-04-01: qty 2

## 2016-04-01 NOTE — ED Notes (Signed)
Patient requesting pain medication. Patient was advised she will need to wait until seen by the physician. Patient was updated that a room is being cleaned for her and barring life threatening emergencies she would be the next person to go back. Patient requested to return to triage, patient was advised that at this time that is not an option. Patient confirms understanding.

## 2016-04-01 NOTE — ED Notes (Addendum)
Per EMS, Patient with R shoulder pain, states that it now hurts into her right chest when she moves her arm and when it is palpated. Patient reports hx of SVT and Afib. Patient ambulatory into ER. 12 lead unremarkable. Patient reports pain has been going on for 1 month, states tonight when it started radiating into her chest she became increasingly concerned.

## 2016-04-01 NOTE — ED Notes (Signed)
Bed: [facility] Expected date:  Expected time:  Means of arrival:  Comments: Shoulder pain radiates to chest/29 yr

## 2016-04-01 NOTE — Discharge Instructions (Signed)

## 2016-04-01 NOTE — ED Notes (Signed)
Patient states on Thursday she began having mid chest pain that radiates around her chest to her mid axilla area. Patient states she also has right shoulder pain that started back again around 2-3 days ago. Patient reports vomiting @15  times in past 24 hours. Patient also c/o SOB, speaking in complete sentences without distress, dizziness and lightheadedness.
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
## 2016-04-04 ENCOUNTER — Ambulatory Visit: Payer: BLUE CROSS/BLUE SHIELD | Attending: Orthopedic Surgery | Admitting: Physical Therapy

## 2016-04-04 DIAGNOSIS — M6281 Muscle weakness (generalized): Secondary | ICD-10-CM | POA: Insufficient documentation

## 2016-04-04 DIAGNOSIS — M5440 Lumbago with sciatica, unspecified side: Secondary | ICD-10-CM | POA: Insufficient documentation
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
## 2016-04-10 ENCOUNTER — Emergency Department
Admission: EM | Admit: 2016-04-10 | Discharge: 2016-04-10 | Disposition: A | Discharge status: Home / Self Care | Payer: BLUE CROSS/BLUE SHIELD | Attending: Emergency Medicine | Admitting: Emergency Medicine

## 2016-04-10 ENCOUNTER — Emergency Department: Payer: BLUE CROSS/BLUE SHIELD

## 2016-04-10 ENCOUNTER — Encounter: Payer: Self-pay | Admitting: Medical Oncology

## 2016-04-10 DIAGNOSIS — G8929 Other chronic pain: Secondary | ICD-10-CM

## 2016-04-10 DIAGNOSIS — Z79899 Other long term (current) drug therapy: Secondary | ICD-10-CM | POA: Insufficient documentation

## 2016-04-10 DIAGNOSIS — Z8669 Personal history of other diseases of the nervous system and sense organs: Secondary | ICD-10-CM | POA: Insufficient documentation

## 2016-04-10 DIAGNOSIS — F319 Bipolar disorder, unspecified: Secondary | ICD-10-CM | POA: Insufficient documentation

## 2016-04-10 DIAGNOSIS — L818 Other specified disorders of pigmentation: Secondary | ICD-10-CM | POA: Insufficient documentation

## 2016-04-10 DIAGNOSIS — L923 Foreign body granuloma of the skin and subcutaneous tissue: Secondary | ICD-10-CM

## 2016-04-10 DIAGNOSIS — I10 Essential (primary) hypertension: Secondary | ICD-10-CM | POA: Insufficient documentation

## 2016-04-10 DIAGNOSIS — I471 Supraventricular tachycardia: Secondary | ICD-10-CM | POA: Insufficient documentation

## 2016-04-10 LAB — BASIC METABOLIC PANEL
Anion gap: 7 (ref 5–15)
BUN: 9 mg/dL (ref 6–20)
CO2: 28 mmol/L (ref 22–32)
Calcium: 9.5 mg/dL (ref 8.9–10.3)
Chloride: 106 mmol/L (ref 101–111)
Creatinine, Ser: 0.79 mg/dL (ref 0.44–1.00)
GFR calc Af Amer: >60 mL/min (ref >60)
GFR calc non Af Amer: >60 mL/min (ref >60)
Glucose, Bld: 136 mg/dL — ABNORMAL HIGH (ref 65–99)
Potassium: 3.5 mmol/L (ref 3.5–5.1)
Sodium: 141 mmol/L (ref 135–145)

## 2016-04-10 LAB — CBC
HCT: 37.9 % (ref 35.0–47.0)
Hemoglobin: 12.5 g/dL (ref 12.0–16.0)
MCH: 26.3 pg (ref 26.0–34.0)
MCHC: 33 g/dL (ref 32.0–36.0)
MCV: 79.7 fL — ABNORMAL LOW (ref 80.0–100.0)
Platelets: 254 10*3/uL (ref 150–440)
RBC: 4.76 MIL/uL (ref 3.80–5.20)
RDW: 15 % — ABNORMAL HIGH (ref 11.5–14.5)
WBC: 4.1 10*3/uL (ref 3.6–11.0)

## 2016-04-10 LAB — TROPONIN I: Troponin I: <0.03 ng/mL (ref <0.031)

## 2016-04-10 MED ORDER — SULFAMETHOXAZOLE-TRIMETHOPRIM 800-160 MG PO TABS: 1.0000 | ORAL_TABLET | Freq: Two times a day (BID) | ORAL | Status: DC

## 2016-04-10 MED ORDER — BACITRACIN-NEOMYCIN-POLYMYXIN OINTMENT TUBE: TOPICAL_OINTMENT | Freq: Once | CUTANEOUS | Status: DC

## 2016-04-10 MED ORDER — DIPHENHYDRAMINE HCL 25 MG PO CAPS: 25.0000 mg | ORAL_CAPSULE | Freq: Three times a day (TID) | ORAL | Status: DC | PRN

## 2016-04-10 MED ORDER — KETOROLAC TROMETHAMINE 30 MG/ML IJ SOLN
60.0000 mg | Freq: Once | INTRAMUSCULAR | Status: AC
  Administered 2016-04-10: 60 mg via INTRAMUSCULAR
  Filled 2016-04-10: qty 2

## 2016-04-10 MED ORDER — BACITRACIN ZINC 500 UNIT/GM EX OINT
TOPICAL_OINTMENT | Freq: Once | CUTANEOUS | Status: AC
  Administered 2016-04-10: 1 via TOPICAL
  Filled 2016-04-10: qty 0.9

## 2016-04-10 MED ORDER — KETOROLAC TROMETHAMINE 10 MG PO TABS: 10.0000 mg | ORAL_TABLET | Freq: Three times a day (TID) | ORAL | Status: DC | PRN

## 2016-04-10 NOTE — ED Notes (Signed)
Patient has multiple complaints. States back pain that radiates to both hips. Bilateral leg pain with the left one being worse. She was seen at [facility] last week for this same problem. States chest tightness. Rash to right forearm from recent tattoo. States fibromyalgia is worse due to stess she is currently under.
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
## 2016-04-10 NOTE — ED Notes (Addendum)
Pt reports she has been having chest tightness off and on x 1 week with sob, numbness in all limbs, body aches and shortness of breath. Pt was seen at [facility] last week for same. Pt also reports that she had a tattoo placed on her rt FA and since has been having itching to area and "rash" around area.

## 2016-04-10 NOTE — Discharge Instructions (Signed)
Please take the entire course of antibiotics, even if you're feeling better. You may apply a triple antibiotic cream such as Neosporin or bacitracin to your tattooed area and a thick coat 3 times daily.  Please call your primary care doctor to make a long-term pain management plan and to have your tattoo re-evaluated.  Return to the emergency department if you develop severe pain, shortness of breath, fever, inability to keep down fluids, or any other symptoms concerning to you.

## 2016-05-08 NOTE — Addendum Note (Signed)
Addended by: Cammie Mcgee on: 05/08/2016 09:55 AM   Modules accepted: Orders

## 2016-05-09 ENCOUNTER — Emergency Department
Admission: EM | Admit: 2016-05-09 | Discharge: 2016-05-09 | Disposition: A | Discharge status: Home / Self Care | Payer: Self-pay | Attending: Student | Admitting: Student

## 2016-05-09 ENCOUNTER — Encounter: Payer: Self-pay | Admitting: Emergency Medicine

## 2016-05-09 ENCOUNTER — Other Ambulatory Visit: Payer: Self-pay

## 2016-05-09 ENCOUNTER — Ambulatory Visit: Payer: Self-pay | Attending: Orthopedic Surgery | Admitting: Physical Therapy

## 2016-05-09 ENCOUNTER — Emergency Department: Payer: Self-pay

## 2016-05-09 DIAGNOSIS — Z792 Long term (current) use of antibiotics: Secondary | ICD-10-CM | POA: Insufficient documentation

## 2016-05-09 DIAGNOSIS — R609 Edema, unspecified: Secondary | ICD-10-CM | POA: Insufficient documentation

## 2016-05-09 DIAGNOSIS — M5416 Radiculopathy, lumbar region: Secondary | ICD-10-CM | POA: Insufficient documentation

## 2016-05-09 DIAGNOSIS — R0602 Shortness of breath: Secondary | ICD-10-CM | POA: Insufficient documentation

## 2016-05-09 DIAGNOSIS — F319 Bipolar disorder, unspecified: Secondary | ICD-10-CM | POA: Insufficient documentation

## 2016-05-09 DIAGNOSIS — R531 Weakness: Secondary | ICD-10-CM | POA: Insufficient documentation

## 2016-05-09 DIAGNOSIS — Z79899 Other long term (current) drug therapy: Secondary | ICD-10-CM | POA: Insufficient documentation

## 2016-05-09 DIAGNOSIS — Z8669 Personal history of other diseases of the nervous system and sense organs: Secondary | ICD-10-CM | POA: Insufficient documentation

## 2016-05-09 DIAGNOSIS — M5442 Lumbago with sciatica, left side: Secondary | ICD-10-CM | POA: Insufficient documentation

## 2016-05-09 DIAGNOSIS — M6281 Muscle weakness (generalized): Secondary | ICD-10-CM | POA: Insufficient documentation

## 2016-05-09 DIAGNOSIS — M5440 Lumbago with sciatica, unspecified side: Secondary | ICD-10-CM | POA: Insufficient documentation

## 2016-05-09 DIAGNOSIS — I10 Essential (primary) hypertension: Secondary | ICD-10-CM | POA: Insufficient documentation

## 2016-05-09 LAB — POCT PREGNANCY, URINE: Preg Test, Ur: NEGATIVE

## 2016-05-09 LAB — CBC
HCT: 37.5 % (ref 35.0–47.0)
Hemoglobin: 12 g/dL (ref 12.0–16.0)
MCH: 26.4 pg (ref 26.0–34.0)
MCHC: 32 g/dL (ref 32.0–36.0)
MCV: 82.5 fL (ref 80.0–100.0)
Platelets: 229 10*3/uL (ref 150–440)
RBC: 4.54 MIL/uL (ref 3.80–5.20)
RDW: 15.2 % — ABNORMAL HIGH (ref 11.5–14.5)
WBC: 5 10*3/uL (ref 3.6–11.0)

## 2016-05-09 LAB — BASIC METABOLIC PANEL
Anion gap: 8 (ref 5–15)
BUN: 8 mg/dL (ref 6–20)
CO2: 26 mmol/L (ref 22–32)
Calcium: 9.4 mg/dL (ref 8.9–10.3)
Chloride: 105 mmol/L (ref 101–111)
Creatinine, Ser: 0.72 mg/dL (ref 0.44–1.00)
GFR calc Af Amer: >60 mL/min (ref >60)
GFR calc non Af Amer: >60 mL/min (ref >60)
Glucose, Bld: 134 mg/dL — ABNORMAL HIGH (ref 65–99)
Potassium: 3.7 mmol/L (ref 3.5–5.1)
Sodium: 139 mmol/L (ref 135–145)

## 2016-05-09 LAB — FIBRIN DERIVATIVES D-DIMER (ARMC ONLY): Fibrin derivatives D-dimer (ARMC): 322 (ref 0–499)

## 2016-05-09 LAB — TROPONIN I: Troponin I: <0.03 ng/mL (ref <0.031)

## 2016-05-09 LAB — CK: Total CK: 53 U/L (ref 38–234)

## 2016-05-09 MED ORDER — ACETAMINOPHEN 500 MG PO TABS
1000.0000 mg | ORAL_TABLET | Freq: Once | ORAL | Status: AC
  Administered 2016-05-09: 1000 mg via ORAL
  Filled 2016-05-09: qty 2

## 2016-05-09 MED ORDER — FUROSEMIDE 10 MG/ML IJ SOLN
20.0000 mg | Freq: Once | INTRAMUSCULAR | Status: AC
  Administered 2016-05-09: 20 mg via INTRAVENOUS
  Filled 2016-05-09: qty 4
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
## 2016-05-09 NOTE — Therapy (Addendum)
[facility] MAIN [facility] SERVICES [address] [place], [place], [address] Phone: [phone]   Fax:  [phone]  Patient Details  Name: Andrea Miller MRN: [id number] Date of Birth: 02-10-1986 Referring Provider:  Jacolyn Reedy, MD  Encounter Date: 05/09/2016  Pt arrived for pool therapy and reported she had been experiencing swelling and 10/10 pain in her L leg for the past two days and the pain came on suddenly. Pain is sharp and cramp-like. "It hurts to walk on it". Pt reported she has had swelling in her leg in the past but felt different. PT noted increased tightness in L calf compared to R with increased swelling in L foot. Aquatic PT session was withheld due pt's Sx.  PT educated pt and pt's mother who was present at the appt, that a visit to the ED is warranted to r/o the possibility of a DVT. PT and mother voiced understanding.  Aquatic therapy will be withheld until MD verifies pt is appropriate for aquatic Tx.     Mariane Masters ,PT, DPT, E-RYT  05/09/2016, 11:14 AM  [facility] MAIN [facility] SERVICES [address] [place], [place], [address] Phone: [phone]   Fax:  [phone]

## 2016-05-09 NOTE — ED Notes (Signed)
C/O SOB, fatigue, swollen feet and legs.  Patient has had similar symptoms in the past.

## 2016-05-09 NOTE — ED Notes (Signed)
MD at bedside. 

## 2016-05-11 ENCOUNTER — Emergency Department: Payer: Self-pay

## 2016-05-11 ENCOUNTER — Emergency Department
Admission: EM | Admit: 2016-05-11 | Discharge: 2016-05-11 | Disposition: A | Discharge status: Home / Self Care | Payer: Self-pay | Attending: Student | Admitting: Student

## 2016-05-11 ENCOUNTER — Encounter: Payer: Self-pay | Admitting: *Deleted

## 2016-05-11 DIAGNOSIS — Z791 Long term (current) use of non-steroidal anti-inflammatories (NSAID): Secondary | ICD-10-CM | POA: Insufficient documentation

## 2016-05-11 DIAGNOSIS — Y939 Activity, unspecified: Secondary | ICD-10-CM | POA: Insufficient documentation

## 2016-05-11 DIAGNOSIS — Z792 Long term (current) use of antibiotics: Secondary | ICD-10-CM | POA: Insufficient documentation

## 2016-05-11 DIAGNOSIS — F329 Major depressive disorder, single episode, unspecified: Secondary | ICD-10-CM | POA: Insufficient documentation

## 2016-05-11 DIAGNOSIS — S022XXA Fracture of nasal bones, initial encounter for closed fracture: Secondary | ICD-10-CM | POA: Insufficient documentation

## 2016-05-11 DIAGNOSIS — Y999 Unspecified external cause status: Secondary | ICD-10-CM | POA: Insufficient documentation

## 2016-05-11 DIAGNOSIS — Z79899 Other long term (current) drug therapy: Secondary | ICD-10-CM | POA: Insufficient documentation

## 2016-05-11 DIAGNOSIS — Z8669 Personal history of other diseases of the nervous system and sense organs: Secondary | ICD-10-CM | POA: Insufficient documentation

## 2016-05-11 DIAGNOSIS — Z7951 Long term (current) use of inhaled steroids: Secondary | ICD-10-CM | POA: Insufficient documentation

## 2016-05-11 DIAGNOSIS — S0083XA Contusion of other part of head, initial encounter: Secondary | ICD-10-CM | POA: Insufficient documentation

## 2016-05-11 DIAGNOSIS — Y929 Unspecified place or not applicable: Secondary | ICD-10-CM | POA: Insufficient documentation

## 2016-05-11 DIAGNOSIS — S40022A Contusion of left upper arm, initial encounter: Secondary | ICD-10-CM | POA: Insufficient documentation

## 2016-05-11 DIAGNOSIS — I10 Essential (primary) hypertension: Secondary | ICD-10-CM | POA: Insufficient documentation

## 2016-05-11 MED ORDER — HYDROCODONE-ACETAMINOPHEN 5-325 MG PO TABS: 1.0000 | ORAL_TABLET | ORAL | Status: DC | PRN

## 2016-05-11 MED ORDER — OXYCODONE-ACETAMINOPHEN 5-325 MG PO TABS
1.0000 | ORAL_TABLET | Freq: Once | ORAL | Status: AC
  Administered 2016-05-11: 1 via ORAL
  Filled 2016-05-11: qty 1
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
## 2016-05-11 NOTE — ED Notes (Signed)
  Reviewed d/c instructions, follow-up care, and prescriptions with pt. Pt verbalized understanding 
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
## 2016-05-16 ENCOUNTER — Ambulatory Visit: Payer: Self-pay | Admitting: Physical Therapy

## 2016-05-17 ENCOUNTER — Encounter (HOSPITAL_COMMUNITY): Payer: Self-pay | Admitting: *Deleted

## 2016-05-17 ENCOUNTER — Emergency Department (HOSPITAL_COMMUNITY)
Admission: EM | Admit: 2016-05-17 | Discharge: 2016-05-18 | Disposition: A | Discharge status: Home / Self Care | Payer: Self-pay | Attending: Emergency Medicine | Admitting: Emergency Medicine

## 2016-05-17 DIAGNOSIS — Z8669 Personal history of other diseases of the nervous system and sense organs: Secondary | ICD-10-CM | POA: Insufficient documentation

## 2016-05-17 DIAGNOSIS — Z79899 Other long term (current) drug therapy: Secondary | ICD-10-CM | POA: Insufficient documentation

## 2016-05-17 DIAGNOSIS — F0781 Postconcussional syndrome: Secondary | ICD-10-CM | POA: Insufficient documentation

## 2016-05-17 DIAGNOSIS — G44209 Tension-type headache, unspecified, not intractable: Secondary | ICD-10-CM | POA: Insufficient documentation

## 2016-05-17 DIAGNOSIS — I10 Essential (primary) hypertension: Secondary | ICD-10-CM | POA: Insufficient documentation

## 2016-05-17 DIAGNOSIS — Z791 Long term (current) use of non-steroidal anti-inflammatories (NSAID): Secondary | ICD-10-CM | POA: Insufficient documentation

## 2016-05-17 DIAGNOSIS — Z7951 Long term (current) use of inhaled steroids: Secondary | ICD-10-CM | POA: Insufficient documentation

## 2016-05-17 DIAGNOSIS — F319 Bipolar disorder, unspecified: Secondary | ICD-10-CM | POA: Insufficient documentation
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
## 2016-05-18 ENCOUNTER — Ambulatory Visit: Payer: Self-pay | Admitting: Physical Therapy

## 2016-05-18 MED ORDER — PROCHLORPERAZINE EDISYLATE 5 MG/ML IJ SOLN
10.0000 mg | Freq: Once | INTRAMUSCULAR | Status: AC
  Administered 2016-05-18: 10 mg via INTRAVENOUS
  Filled 2016-05-18: qty 2

## 2016-05-18 MED ORDER — BUTALBITAL-APAP-CAFFEINE 50-325-40 MG PO TABS
1.0000 | ORAL_TABLET | Freq: Three times a day (TID) | ORAL | Status: DC | PRN
Start: 2016-05-18 — End: 2016-09-01

## 2016-05-18 MED ORDER — SODIUM CHLORIDE 0.9 % IV BOLUS (SEPSIS)
1000.0000 mL | Freq: Once | INTRAVENOUS | Status: AC
  Administered 2016-05-18: 1000 mL via INTRAVENOUS

## 2016-05-18 MED ORDER — DIPHENHYDRAMINE HCL 50 MG/ML IJ SOLN
25.0000 mg | Freq: Once | INTRAMUSCULAR | Status: AC
  Administered 2016-05-18: 25 mg via INTRAVENOUS
  Filled 2016-05-18: qty 1

## 2016-05-18 NOTE — Discharge Instructions (Signed)
Post-Concussion Syndrome Post-concussion syndrome describes the symptoms that can occur after a head injury. These symptoms can last from weeks to months. CAUSES  It is not clear why some head injuries cause post-concussion syndrome. It can occur whether your head injury was mild or severe and whether you were wearing head protection or not.  SIGNS AND SYMPTOMS  Memory difficulties.  Dizziness.  Headaches.  Double vision or blurry vision.  Sensitivity to light.  Hearing difficulties.  Depression.  Tiredness.  Weakness.  Difficulty with concentration.  Difficulty sleeping or staying asleep.  Vomiting.  Poor balance or instability on your feet.  Slow reaction time.  Difficulty learning and remembering things you have heard. DIAGNOSIS  There is no test to determine whether you have post-concussion syndrome. Your health care provider may order an imaging scan of your brain, such as a CT scan, to check for other problems that may be causing your symptoms (such as a severe injury inside your skull). TREATMENT  Usually, these problems disappear over time without medical care. Your health care provider may prescribe medicine to help ease your symptoms. It is important to follow up with a neurologist to evaluate your recovery and address any lingering symptoms or issues. HOME CARE INSTRUCTIONS   Take medicines only as directed by your health care provider. Do not take aspirin. Aspirin can slow blood clotting.  Sleep with your head slightly elevated to help with headaches.  Avoid any situation where there is potential for another head injury. This includes football, hockey, soccer, basketball, martial arts, downhill snow sports, and horseback riding. Your condition will get worse every time you experience a concussion. You should avoid these activities until you are evaluated by the appropriate follow-up health care providers.  Keep all follow-up visits as directed by your health  care provider. This is important. SEEK MEDICAL CARE IF:  You have increased problems paying attention or concentrating.  You have increased difficulty remembering or learning new information.  You need more time to complete tasks or assignments than before.  You have increased irritability or decreased ability to cope with stress.  You have more symptoms than before. Seek medical care if you have any of the following symptoms for more than two weeks after your injury:  Lasting (chronic) headaches.  Dizziness or balance problems.  Nausea.  Vision problems.  Increased sensitivity to noise or light.  Depression or mood swings.  Anxiety or irritability.  Memory problems.  Difficulty concentrating or paying attention.  Sleep problems.  Feeling tired all the time. SEEK IMMEDIATE MEDICAL CARE IF:  You have confusion or unusual drowsiness.  Others find it difficult to wake you up.  You have nausea or persistent, forceful vomiting.  You feel like you are moving when you are not (vertigo). Your eyes may move rapidly back and forth.  You have convulsions or faint.  You have severe, persistent headaches that are not relieved by medicine.  You cannot use your arms or legs normally.  One of your pupils is larger than the other.  You have clear or bloody discharge from your nose or ears.  Your problems are getting worse, not better. MAKE SURE YOU:  Understand these instructions.  Will watch your condition.  Will get help right away if you are not doing well or get worse.   This information is not intended to replace advice given to you by your health care provider. Make sure you discuss any questions you have with your health care provider.  Document Released: 05/05/2002 Document Revised: 12/04/2014 Document Reviewed: 02/18/2014 Elsevier Interactive Patient Education 2016 [employer]  Tension Headache A tension headache is pain, pressure, or aching that is  felt over the front and sides of your head. These headaches can last from 30 minutes to several days. HOME CARE Managing Pain  Take over-the-counter and prescription medicines only as told by your doctor.  Lie down in a dark, quiet room when you have a headache.  If directed, apply ice to your head and neck area:  Put ice in a plastic bag.  Place a towel between your skin and the bag.  Leave the ice on for 20 minutes, 2-3 times per day.  Use a heating pad or a hot shower to apply heat to your head and neck area as told by your doctor. Eating and Drinking  Eat meals on a regular schedule.  Do not drink a lot of alcohol.  Do not use a lot of caffeine, or stop using caffeine. General Instructions  Keep all follow-up visits as told by your doctor. This is important.  Keep a journal to find out if certain things bring on headaches. For example, write down:  What you eat and drink.  How much sleep you get.  Any change to your diet or medicines.  Try getting a massage, or doing other things that help you to relax.  Lessen stress.  Sit up straight. Do not tighten (tense) your muscles.  Do not use tobacco products. This includes cigarettes, chewing tobacco, or e-cigarettes. If you need help quitting, ask your doctor.  Exercise regularly as told by your doctor.  Get enough sleep. This may mean 7-9 hours of sleep. GET HELP IF:  Your symptoms are not helped by medicine.  You have a headache that feels different from your usual headache.  You feel sick to your stomach (nauseous) or you throw up (vomit).  You have a fever. GET HELP RIGHT AWAY IF:  Your headache becomes very bad.  You keep throwing up.  You have a stiff neck.  You have trouble seeing.  You have trouble speaking.  You have pain in your eye or ear.  Your muscles are weak or you lose muscle control.  You lose your balance or you have trouble walking.  You feel like you will pass out (faint) or  you pass out.  You have confusion.   This information is not intended to replace advice given to you by your health care provider. Make sure you discuss any questions you have with your health care provider.   Document Released: 02/07/2010 Document Revised: 08/04/2015 Document Reviewed: 03/08/2015 Elsevier Interactive Patient Education [employer]

## 2016-05-23 ENCOUNTER — Ambulatory Visit: Payer: Self-pay | Admitting: Physical Therapy

## 2016-05-25 ENCOUNTER — Ambulatory Visit: Payer: Self-pay | Admitting: Physical Therapy

## 2016-05-25 DIAGNOSIS — M5440 Lumbago with sciatica, unspecified side: Secondary | ICD-10-CM

## 2016-05-25 DIAGNOSIS — M6281 Muscle weakness (generalized): Secondary | ICD-10-CM
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
## 2016-05-25 NOTE — Addendum Note (Signed)
Addended by: Mariane Masters on: 05/25/2016 02:17 PM   Modules accepted: Orders

## 2016-06-01 ENCOUNTER — Ambulatory Visit: Payer: Self-pay | Attending: Orthopedic Surgery | Admitting: Physical Therapy

## 2016-06-01 DIAGNOSIS — M5442 Lumbago with sciatica, left side: Secondary | ICD-10-CM | POA: Insufficient documentation

## 2016-06-01 DIAGNOSIS — R531 Weakness: Secondary | ICD-10-CM | POA: Insufficient documentation

## 2016-06-01 DIAGNOSIS — M5440 Lumbago with sciatica, unspecified side: Secondary | ICD-10-CM | POA: Insufficient documentation

## 2016-06-01 DIAGNOSIS — M5416 Radiculopathy, lumbar region: Secondary | ICD-10-CM | POA: Insufficient documentation

## 2016-06-01 DIAGNOSIS — M6281 Muscle weakness (generalized): Secondary | ICD-10-CM | POA: Insufficient documentation
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
## 2016-06-06 ENCOUNTER — Encounter: Payer: Self-pay | Admitting: Physical Therapy

## 2016-06-06 ENCOUNTER — Ambulatory Visit: Payer: Self-pay | Admitting: Physical Therapy

## 2016-06-12 ENCOUNTER — Emergency Department
Admission: EM | Admit: 2016-06-12 | Discharge: 2016-06-12 | Disposition: A | Discharge status: Home / Self Care | Payer: Self-pay | Attending: Emergency Medicine | Admitting: Emergency Medicine

## 2016-06-12 ENCOUNTER — Encounter: Payer: Self-pay | Admitting: *Deleted

## 2016-06-12 DIAGNOSIS — J111 Influenza due to unidentified influenza virus with other respiratory manifestations: Secondary | ICD-10-CM

## 2016-06-12 DIAGNOSIS — Z79899 Other long term (current) drug therapy: Secondary | ICD-10-CM | POA: Insufficient documentation

## 2016-06-12 DIAGNOSIS — Z8669 Personal history of other diseases of the nervous system and sense organs: Secondary | ICD-10-CM | POA: Insufficient documentation

## 2016-06-12 DIAGNOSIS — I10 Essential (primary) hypertension: Secondary | ICD-10-CM | POA: Insufficient documentation

## 2016-06-12 DIAGNOSIS — J069 Acute upper respiratory infection, unspecified: Secondary | ICD-10-CM | POA: Insufficient documentation

## 2016-06-12 DIAGNOSIS — Z791 Long term (current) use of non-steroidal anti-inflammatories (NSAID): Secondary | ICD-10-CM | POA: Insufficient documentation

## 2016-06-12 DIAGNOSIS — F329 Major depressive disorder, single episode, unspecified: Secondary | ICD-10-CM | POA: Insufficient documentation

## 2016-06-12 DIAGNOSIS — Z7951 Long term (current) use of inhaled steroids: Secondary | ICD-10-CM | POA: Insufficient documentation

## 2016-06-12 DIAGNOSIS — R69 Illness, unspecified: Secondary | ICD-10-CM

## 2016-06-12 MED ORDER — ONDANSETRON 8 MG PO TBDP
8.0000 mg | ORAL_TABLET | Freq: Once | ORAL | Status: AC
  Administered 2016-06-12: 8 mg via ORAL
  Filled 2016-06-12: qty 1

## 2016-06-12 MED ORDER — FAMOTIDINE 20 MG PO TABS: 20.0000 mg | ORAL_TABLET | Freq: Two times a day (BID) | ORAL | Status: DC

## 2016-06-12 MED ORDER — ONDANSETRON 4 MG PO TBDP
ORAL_TABLET | ORAL | Status: AC
  Filled 2016-06-12: qty 1

## 2016-06-12 MED ORDER — IPRATROPIUM-ALBUTEROL 0.5-2.5 (3) MG/3ML IN SOLN
RESPIRATORY_TRACT | Status: AC
  Filled 2016-06-12: qty 3

## 2016-06-12 MED ORDER — NAPROXEN 500 MG PO TABS: 500.0000 mg | ORAL_TABLET | Freq: Two times a day (BID) | ORAL | Status: DC

## 2016-06-12 MED ORDER — KETOROLAC TROMETHAMINE 60 MG/2ML IM SOLN
15.0000 mg | Freq: Once | INTRAMUSCULAR | Status: AC
  Administered 2016-06-12: 15 mg via INTRAMUSCULAR
  Filled 2016-06-12: qty 2

## 2016-06-12 MED ORDER — DEXAMETHASONE SODIUM PHOSPHATE 10 MG/ML IJ SOLN
10.0000 mg | Freq: Once | INTRAMUSCULAR | Status: AC
  Administered 2016-06-12: 10 mg via INTRAMUSCULAR
  Filled 2016-06-12: qty 1

## 2016-06-12 MED ORDER — PROMETHAZINE HCL 25 MG PO TABS: 25.0000 mg | ORAL_TABLET | Freq: Four times a day (QID) | ORAL | Status: DC | PRN

## 2016-06-12 NOTE — ED Notes (Signed)

## 2016-06-12 NOTE — ED Notes (Addendum)
States nasal congestion, headache, body aches, vomiting and SOB for 3 days, states she can not sleep, states last vomited this AM, pt awake and alert in no distress, pt also states she feels dehydrated
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
## 2016-06-12 NOTE — Discharge Instructions (Signed)
Upper Respiratory Infection, Adult °Most upper respiratory infections (URIs) are a viral infection of the air passages leading to the lungs. A URI affects the nose, throat, and upper air passages. The most common type of URI is nasopharyngitis and is typically referred to as "the common cold." °URIs run their course and usually go away on their own. Most of the time, a URI does not require medical attention, but sometimes a bacterial infection in the upper airways can follow a viral infection. This is called a secondary infection. Sinus and middle ear infections are common types of secondary upper respiratory infections. °Bacterial pneumonia can also complicate a URI. A URI can worsen asthma and chronic obstructive pulmonary disease (COPD). Sometimes, these complications can require emergency medical care and may be life threatening.  °CAUSES °Almost all URIs are caused by viruses. A virus is a type of germ and can spread from one person to another.  °RISKS FACTORS °You may be at risk for a URI if:  °· You smoke.   °· You have chronic heart or lung disease. °· You have a weakened defense (immune) system.   °· You are very young or very old.   °· You have nasal allergies or asthma. °· You work in crowded or poorly ventilated areas. °· You work in health care facilities or schools. °SIGNS AND SYMPTOMS  °Symptoms typically develop 2-3 days after you come in contact with a cold virus. Most viral URIs last 7-10 days. However, viral URIs from the influenza virus (flu virus) can last 14-18 days and are typically more severe. Symptoms may include:  °· Runny or stuffy (congested) nose.   °· Sneezing.   °· Cough.   °· Sore throat.   °· Headache.   °· Fatigue.   °· Fever.   °· Loss of appetite.   °· Pain in your forehead, behind your eyes, and over your cheekbones (sinus pain). °· Muscle aches.   °DIAGNOSIS  °Your health care provider may diagnose a URI by: °· Physical exam. °· Tests to check that your symptoms are not due to  another condition such as: °· Strep throat. °· Sinusitis. °· Pneumonia. °· Asthma. °TREATMENT  °A URI goes away on its own with time. It cannot be cured with medicines, but medicines may be prescribed or recommended to relieve symptoms. Medicines may help: °· Reduce your fever. °· Reduce your cough. °· Relieve nasal congestion. °HOME CARE INSTRUCTIONS  °· Take medicines only as directed by your health care provider.   °· Gargle warm saltwater or take cough drops to comfort your throat as directed by your health care provider. °· Use a warm mist humidifier or inhale steam from a shower to increase air moisture. This may make it easier to breathe. °· Drink enough fluid to keep your urine clear or pale yellow.   °· Eat soups and other clear broths and maintain good nutrition.   °· Rest as needed.   °· Return to work when your temperature has returned to normal or as your health care provider advises. You may need to stay home longer to avoid infecting others. You can also use a face mask and careful hand washing to prevent spread of the virus. °· Increase the usage of your inhaler if you have asthma.   °· Do not use any tobacco products, including cigarettes, chewing tobacco, or electronic cigarettes. If you need help quitting, ask your health care provider. °PREVENTION  °The best way to protect yourself from getting a cold is to practice good hygiene.  °· Avoid oral or hand contact with people with cold   symptoms.   Wash your hands often if contact occurs.  There is no clear evidence that vitamin C, vitamin E, echinacea, or exercise reduces the chance of developing a cold. However, it is always recommended to get plenty of rest, exercise, and practice good nutrition.  SEEK MEDICAL CARE IF:   You are getting worse rather than better.   Your symptoms are not controlled by medicine.   You have chills.  You have worsening shortness of breath.  You have brown or red mucus.  You have yellow or brown nasal  discharge.  You have pain in your face, especially when you bend forward.  You have a fever.  You have swollen neck glands.  You have pain while swallowing.  You have white areas in the back of your throat. SEEK IMMEDIATE MEDICAL CARE IF:   You have severe or persistent:  Headache.  Ear pain.  Sinus pain.  Chest pain.  You have chronic lung disease and any of the following:  Wheezing.  Prolonged cough.  Coughing up blood.  A change in your usual mucus.  You have a stiff neck.  You have changes in your:  Vision.  Hearing.  Thinking.  Mood. MAKE SURE YOU:   Understand these instructions.  Will watch your condition.  Will get help right away if you are not doing well or get worse.   This information is not intended to replace advice given to you by your health care provider. Make sure you discuss any questions you have with your health care provider.   Document Released: 05/09/2001 Document Revised: 03/30/2015 Document Reviewed: 02/18/2014 Elsevier Interactive Patient Education 2016 [employer]  Viral Infections A viral infection can be caused by different types of viruses.Most viral infections are not serious and resolve on their own. However, some infections may cause severe symptoms and may lead to further complications. SYMPTOMS Viruses can frequently cause:  Minor sore throat.  Aches and pains.  Headaches.  Runny nose.  Different types of rashes.  Watery eyes.  Tiredness.  Cough.  Loss of appetite.  Gastrointestinal infections, resulting in nausea, vomiting, and diarrhea. These symptoms do not respond to antibiotics because the infection is not caused by bacteria. However, you might catch a bacterial infection following the viral infection. This is sometimes called a "superinfection." Symptoms of such a bacterial infection may include:  Worsening sore throat with pus and difficulty swallowing.  Swollen neck glands.  Chills  and a high or persistent fever.  Severe headache.  Tenderness over the sinuses.  Persistent overall ill feeling (malaise), muscle aches, and tiredness (fatigue).  Persistent cough.  Yellow, green, or brown mucus production with coughing. HOME CARE INSTRUCTIONS   Only take over-the-counter or prescription medicines for pain, discomfort, diarrhea, or fever as directed by your caregiver.  Drink enough water and fluids to keep your urine clear or pale yellow. Sports drinks can provide valuable electrolytes, sugars, and hydration.  Get plenty of rest and maintain proper nutrition. Soups and broths with crackers or rice are fine. SEEK IMMEDIATE MEDICAL CARE IF:   You have severe headaches, shortness of breath, chest pain, neck pain, or an unusual rash.  You have uncontrolled vomiting, diarrhea, or you are unable to keep down fluids.  You or your child has an oral temperature above 102 F (38.9 C), not controlled by medicine.  Your baby is older than 3 months with a rectal temperature of 102 F (38.9 C) or higher.  Your baby is 69 months old  or younger with a rectal temperature of 100.4 F (38 C) or higher. MAKE SURE YOU:   Understand these instructions.  Will watch your condition.  Will get help right away if you are not doing well or get worse.   This information is not intended to replace advice given to you by your health care provider. Make sure you discuss any questions you have with your health care provider.   Document Released: 08/23/2005 Document Revised: 02/05/2012 Document Reviewed: 04/21/2015 Elsevier Interactive Patient Education [employer]

## 2016-06-15 ENCOUNTER — Emergency Department: Payer: Self-pay

## 2016-06-15 ENCOUNTER — Emergency Department
Admission: EM | Admit: 2016-06-15 | Discharge: 2016-06-15 | Disposition: A | Discharge status: Home / Self Care | Payer: Self-pay | Attending: Emergency Medicine | Admitting: Emergency Medicine

## 2016-06-15 ENCOUNTER — Encounter: Payer: Self-pay | Admitting: Emergency Medicine

## 2016-06-15 DIAGNOSIS — I10 Essential (primary) hypertension: Secondary | ICD-10-CM | POA: Insufficient documentation

## 2016-06-15 DIAGNOSIS — B349 Viral infection, unspecified: Secondary | ICD-10-CM

## 2016-06-15 DIAGNOSIS — R112 Nausea with vomiting, unspecified: Secondary | ICD-10-CM | POA: Insufficient documentation

## 2016-06-15 DIAGNOSIS — Z79899 Other long term (current) drug therapy: Secondary | ICD-10-CM | POA: Insufficient documentation

## 2016-06-15 DIAGNOSIS — J069 Acute upper respiratory infection, unspecified: Secondary | ICD-10-CM | POA: Insufficient documentation

## 2016-06-15 DIAGNOSIS — F319 Bipolar disorder, unspecified: Secondary | ICD-10-CM | POA: Insufficient documentation

## 2016-06-15 LAB — CBC WITH DIFFERENTIAL/PLATELET
Basophils Absolute: 0 10*3/uL (ref 0–0.1)
Basophils Relative: 1 %
Eosinophils Absolute: 0.2 10*3/uL (ref 0–0.7)
Eosinophils Relative: 3 %
HCT: 39.9 % (ref 35.0–47.0)
Hemoglobin: 13 g/dL (ref 12.0–16.0)
Lymphocytes Relative: 42 %
Lymphs Abs: 2.3 10*3/uL (ref 1.0–3.6)
MCH: 27.3 pg (ref 26.0–34.0)
MCHC: 32.6 g/dL (ref 32.0–36.0)
MCV: 83.8 fL (ref 80.0–100.0)
Monocytes Absolute: 0.6 10*3/uL (ref 0.2–0.9)
Monocytes Relative: 12 %
Neutro Abs: 2.3 10*3/uL (ref 1.4–6.5)
Neutrophils Relative %: 42 %
Platelets: 215 10*3/uL (ref 150–440)
RBC: 4.76 MIL/uL (ref 3.80–5.20)
RDW: 15.3 % — ABNORMAL HIGH (ref 11.5–14.5)
WBC: 5.4 10*3/uL (ref 3.6–11.0)

## 2016-06-15 LAB — URINALYSIS COMPLETE WITH MICROSCOPIC (ARMC ONLY)
Bilirubin Urine: NEGATIVE
Glucose, UA: NEGATIVE mg/dL
Hgb urine dipstick: NEGATIVE
Ketones, ur: NEGATIVE mg/dL
Leukocytes, UA: NEGATIVE
Nitrite: NEGATIVE
Protein, ur: NEGATIVE mg/dL
Specific Gravity, Urine: 1.014 (ref 1.005–1.030)
pH: 7 (ref 5.0–8.0)

## 2016-06-15 LAB — COMPREHENSIVE METABOLIC PANEL
ALT: 9 U/L — ABNORMAL LOW (ref 14–54)
AST: 14 U/L — ABNORMAL LOW (ref 15–41)
Albumin: 4 g/dL (ref 3.5–5.0)
Alkaline Phosphatase: 69 U/L (ref 38–126)
Anion gap: 7 (ref 5–15)
BUN: 10 mg/dL (ref 6–20)
CO2: 28 mmol/L (ref 22–32)
Calcium: 9.4 mg/dL (ref 8.9–10.3)
Chloride: 103 mmol/L (ref 101–111)
Creatinine, Ser: 0.63 mg/dL (ref 0.44–1.00)
GFR calc Af Amer: >60 mL/min (ref >60)
GFR calc non Af Amer: >60 mL/min (ref >60)
Glucose, Bld: 90 mg/dL (ref 65–99)
Potassium: 4 mmol/L (ref 3.5–5.1)
Sodium: 138 mmol/L (ref 135–145)
Total Bilirubin: 0.7 mg/dL (ref 0.3–1.2)
Total Protein: 7.6 g/dL (ref 6.5–8.1)

## 2016-06-15 LAB — POCT PREGNANCY, URINE: Preg Test, Ur: NEGATIVE

## 2016-06-15 MED ORDER — MELOXICAM 15 MG PO TABS: 15.0000 mg | ORAL_TABLET | Freq: Every day | ORAL | Status: DC

## 2016-06-15 MED ORDER — GUAIFENESIN-CODEINE 100-10 MG/5ML PO SOLN: 10.0000 mL | Freq: Three times a day (TID) | ORAL | Status: DC | PRN

## 2016-06-15 MED ORDER — SODIUM CHLORIDE 0.9 % IV BOLUS (SEPSIS)
1000.0000 mL | Freq: Once | INTRAVENOUS | Status: AC
  Administered 2016-06-15: 1000 mL via INTRAVENOUS

## 2016-06-15 MED ORDER — METOCLOPRAMIDE HCL 10 MG PO TABS: 10.0000 mg | ORAL_TABLET | Freq: Three times a day (TID) | ORAL | Status: DC

## 2016-06-15 NOTE — Discharge Instructions (Signed)
Viral Infections °A viral infection can be caused by different types of viruses. Most viral infections are not serious and resolve on their own. However, some infections may cause severe symptoms and may lead to further complications. °SYMPTOMS °Viruses can frequently cause: °· Minor sore throat. °· Aches and pains. °· Headaches. °· Runny nose. °· Different types of rashes. °· Watery eyes. °· Tiredness. °· Cough. °· Loss of appetite. °· Gastrointestinal infections, resulting in nausea, vomiting, and diarrhea. °These symptoms do not respond to antibiotics because the infection is not caused by bacteria. However, you might catch a bacterial infection following the viral infection. This is sometimes called a "superinfection." Symptoms of such a bacterial infection may include: °· Worsening sore throat with pus and difficulty swallowing. °· Swollen neck glands. °· Chills and a high or persistent fever. °· Severe headache. °· Tenderness over the sinuses. °· Persistent overall ill feeling (malaise), muscle aches, and tiredness (fatigue). °· Persistent cough. °· Yellow, green, or brown mucus production with coughing. °HOME CARE INSTRUCTIONS  °· Only take over-the-counter or prescription medicines for pain, discomfort, diarrhea, or fever as directed by your caregiver. °· Drink enough water and fluids to keep your urine clear or pale yellow. Sports drinks can provide valuable electrolytes, sugars, and hydration. °· Get plenty of rest and maintain proper nutrition. Soups and broths with crackers or rice are fine. °SEEK IMMEDIATE MEDICAL CARE IF:  °· You have severe headaches, shortness of breath, chest pain, neck pain, or an unusual rash. °· You have uncontrolled vomiting, diarrhea, or you are unable to keep down fluids. °· You or your child has an oral temperature above 102° F (38.9° C), not controlled by medicine. °· Your baby is older than 3 months with a rectal temperature of 102° F (38.9° C) or higher. °· Your baby is 3  months old or younger with a rectal temperature of 100.4° F (38° C) or higher. °MAKE SURE YOU:  °· Understand these instructions. °· Will watch your condition. °· Will get help right away if you are not doing well or get worse. °  °This information is not intended to replace advice given to you by your health care provider. Make sure you discuss any questions you have with your health care provider. °  °Document Released: 08/23/2005 Document Revised: 02/05/2012 Document Reviewed: 04/21/2015 °Elsevier Interactive Patient Education ©2016 Elsevier Inc. ° °
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
## 2016-06-15 NOTE — ED Notes (Signed)
States she developed cough, body aches with some chest discomfort   Prod cough

## 2016-06-15 NOTE — ED Notes (Signed)
States she was seen couple of days ago for same sx's  But states sx's area worse  conts to have body aches with pord cough  Coughing up greenish phlegm. Also states she is unable to keep anything down

## 2016-06-20 ENCOUNTER — Ambulatory Visit: Payer: Self-pay | Admitting: Physical Therapy

## 2016-06-20 DIAGNOSIS — R531 Weakness: Secondary | ICD-10-CM

## 2016-06-20 DIAGNOSIS — M5442 Lumbago with sciatica, left side: Secondary | ICD-10-CM

## 2016-06-20 DIAGNOSIS — M5416 Radiculopathy, lumbar region: Secondary | ICD-10-CM

## 2016-06-20 DIAGNOSIS — M6281 Muscle weakness (generalized): Secondary | ICD-10-CM

## 2016-06-20 DIAGNOSIS — M5440 Lumbago with sciatica, unspecified side: Secondary | ICD-10-CM
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
## 2016-06-22 ENCOUNTER — Ambulatory Visit: Payer: Self-pay | Admitting: Physical Therapy

## 2016-06-22 DIAGNOSIS — M5442 Lumbago with sciatica, left side: Secondary | ICD-10-CM

## 2016-06-22 DIAGNOSIS — M5416 Radiculopathy, lumbar region: Secondary | ICD-10-CM

## 2016-06-22 DIAGNOSIS — M5440 Lumbago with sciatica, unspecified side: Secondary | ICD-10-CM

## 2016-06-22 DIAGNOSIS — M6281 Muscle weakness (generalized): Secondary | ICD-10-CM

## 2016-06-22 DIAGNOSIS — R531 Weakness: Secondary | ICD-10-CM
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
## 2016-06-27 ENCOUNTER — Ambulatory Visit: Payer: Medicaid Other | Attending: Orthopedic Surgery | Admitting: Physical Therapy

## 2016-06-27 DIAGNOSIS — R531 Weakness: Secondary | ICD-10-CM | POA: Insufficient documentation

## 2016-06-27 DIAGNOSIS — M5416 Radiculopathy, lumbar region: Secondary | ICD-10-CM | POA: Insufficient documentation

## 2016-06-27 DIAGNOSIS — M5442 Lumbago with sciatica, left side: Secondary | ICD-10-CM | POA: Insufficient documentation

## 2016-06-27 DIAGNOSIS — M6281 Muscle weakness (generalized): Secondary | ICD-10-CM | POA: Insufficient documentation

## 2016-06-27 DIAGNOSIS — M5440 Lumbago with sciatica, unspecified side: Secondary | ICD-10-CM | POA: Insufficient documentation
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
## 2016-06-29 ENCOUNTER — Ambulatory Visit: Payer: Medicaid Other | Admitting: Physical Therapy

## 2016-06-29 ENCOUNTER — Ambulatory Visit: Payer: Self-pay | Admitting: Physical Therapy

## 2016-06-29 DIAGNOSIS — M5440 Lumbago with sciatica, unspecified side: Secondary | ICD-10-CM

## 2016-06-29 DIAGNOSIS — R531 Weakness: Secondary | ICD-10-CM

## 2016-06-29 DIAGNOSIS — M5416 Radiculopathy, lumbar region: Secondary | ICD-10-CM

## 2016-06-29 DIAGNOSIS — M6281 Muscle weakness (generalized): Secondary | ICD-10-CM

## 2016-06-29 DIAGNOSIS — M5442 Lumbago with sciatica, left side: Secondary | ICD-10-CM
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
## 2016-07-04 ENCOUNTER — Ambulatory Visit: Payer: Medicaid Other | Admitting: Physical Therapy

## 2016-07-05 ENCOUNTER — Ambulatory Visit: Payer: Self-pay

## 2016-07-06 ENCOUNTER — Ambulatory Visit: Payer: Medicaid Other | Admitting: Physical Therapy

## 2016-07-11 ENCOUNTER — Encounter: Payer: Self-pay | Admitting: Physical Therapy

## 2016-07-13 ENCOUNTER — Ambulatory Visit: Payer: Medicaid Other | Admitting: Physical Therapy

## 2016-07-13 DIAGNOSIS — M6281 Muscle weakness (generalized): Secondary | ICD-10-CM

## 2016-07-13 DIAGNOSIS — R531 Weakness: Secondary | ICD-10-CM

## 2016-07-13 DIAGNOSIS — M5416 Radiculopathy, lumbar region: Secondary | ICD-10-CM

## 2016-07-13 DIAGNOSIS — M5440 Lumbago with sciatica, unspecified side: Secondary | ICD-10-CM

## 2016-07-13 DIAGNOSIS — M5442 Lumbago with sciatica, left side: Secondary | ICD-10-CM
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
## 2016-07-18 ENCOUNTER — Ambulatory Visit: Payer: Medicaid Other | Admitting: Physical Therapy

## 2016-07-25 ENCOUNTER — Ambulatory Visit: Payer: Medicaid Other | Admitting: Physical Therapy

## 2016-07-25 DIAGNOSIS — M5416 Radiculopathy, lumbar region: Secondary | ICD-10-CM

## 2016-07-25 DIAGNOSIS — R531 Weakness: Secondary | ICD-10-CM

## 2016-07-25 DIAGNOSIS — M5442 Lumbago with sciatica, left side: Secondary | ICD-10-CM

## 2016-07-25 DIAGNOSIS — M5440 Lumbago with sciatica, unspecified side: Secondary | ICD-10-CM

## 2016-07-25 DIAGNOSIS — M6281 Muscle weakness (generalized): Secondary | ICD-10-CM
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
## 2016-07-27 ENCOUNTER — Ambulatory Visit: Payer: Medicaid Other | Admitting: Physical Therapy

## 2016-08-01 ENCOUNTER — Ambulatory Visit: Payer: Self-pay | Attending: Orthopedic Surgery | Admitting: Physical Therapy

## 2016-08-01 DIAGNOSIS — M6281 Muscle weakness (generalized): Secondary | ICD-10-CM | POA: Insufficient documentation

## 2016-08-01 DIAGNOSIS — M5416 Radiculopathy, lumbar region: Secondary | ICD-10-CM | POA: Insufficient documentation

## 2016-08-01 DIAGNOSIS — M5440 Lumbago with sciatica, unspecified side: Secondary | ICD-10-CM | POA: Insufficient documentation

## 2016-08-01 DIAGNOSIS — R531 Weakness: Secondary | ICD-10-CM | POA: Insufficient documentation

## 2016-08-01 DIAGNOSIS — M5442 Lumbago with sciatica, left side: Secondary | ICD-10-CM | POA: Insufficient documentation
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
## 2016-08-03 ENCOUNTER — Ambulatory Visit: Payer: Self-pay | Admitting: Physical Therapy

## 2016-08-03 ENCOUNTER — Telehealth: Payer: Self-pay | Admitting: Physical Therapy

## 2016-08-03 NOTE — Telephone Encounter (Signed)
PT called pt for no show to her pool appt. Left message for upcoming appts which had also been emailed to her at her last appt.

## 2016-08-04 ENCOUNTER — Ambulatory Visit: Payer: Self-pay | Admitting: Physical Therapy

## 2016-08-04 DIAGNOSIS — M6281 Muscle weakness (generalized): Secondary | ICD-10-CM

## 2016-08-04 DIAGNOSIS — M5440 Lumbago with sciatica, unspecified side: Secondary | ICD-10-CM

## 2016-08-04 DIAGNOSIS — M5416 Radiculopathy, lumbar region: Secondary | ICD-10-CM

## 2016-08-04 DIAGNOSIS — R531 Weakness: Secondary | ICD-10-CM

## 2016-08-04 DIAGNOSIS — M5442 Lumbago with sciatica, left side: Secondary | ICD-10-CM

## 2016-08-04 NOTE — Patient Instructions (Signed)
  Clam [place] 45 Degrees on your Left    Lying with hips and knees bent 45, one pillow between knees and ankles. Lift knee with exhale. Be sure pelvis does not roll backward. Do not arch back. Do 10 times  http://ss.exer.us/75   Copyright  VHI. All rights reserved.   __________________  DEEP CORE  EXERCISE  Lay on your back, lift hips up to scoot pelvis under for a relaxed back   Feet are hip width apart , knees bent   Inhale, move nothing  Exhale, move one knee out ~10-15 degrees without hips/low back moving  Inhale, move nothing  Exhale, move other  knee out ~10-15 degrees without hips/low back moving  This is one rep.  Do 5 reps (L +R) then turn to other side  _____  Westley Gambles 45 Degrees on your right     Lying with hips and knees bent 45, one pillow between knees and ankles. Lift knee with exhale. Be sure pelvis does not roll backward. Do not arch back. Do 10 times, each leg, 2 times per day.  http://ss.exer.us/75   Copyright  VHI. All rights reserved.   _____________  DEEP CORE  EXERCISE  Lay on your back, lift hips up to scoot pelvis under for a relaxed back   Feet are hip width apart , knees bent   Inhale, move nothing  Exhale, move one knee out ~10-15 degrees without hips/low back moving  Inhale, move nothing  Exhale, move other  knee out ~10-15 degrees without hips/low back moving  This is one rep.  Do 5 reps (L +R) then turn to other side    ------------  Think about something you feel grateful after you finish the exercise or when you feel down   Keep a gratitude journal for each day (type it into your phone)  https://www.forbes.com/sites/amymorin/2013/10/19/7-scientifically-proven-benefits-of-gratitude-that-will-motivate-you-to-give-thanks-year-round/#4b058a77183c
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
## 2016-08-09 ENCOUNTER — Ambulatory Visit: Payer: Self-pay | Admitting: Physical Therapy

## 2016-08-10 ENCOUNTER — Emergency Department: Payer: Self-pay

## 2016-08-10 ENCOUNTER — Encounter: Payer: Self-pay | Admitting: Emergency Medicine

## 2016-08-10 ENCOUNTER — Emergency Department
Admission: EM | Admit: 2016-08-10 | Discharge: 2016-08-10 | Disposition: A | Discharge status: Home / Self Care | Payer: Self-pay | Attending: Emergency Medicine | Admitting: Emergency Medicine

## 2016-08-10 DIAGNOSIS — J069 Acute upper respiratory infection, unspecified: Secondary | ICD-10-CM | POA: Insufficient documentation

## 2016-08-10 DIAGNOSIS — I10 Essential (primary) hypertension: Secondary | ICD-10-CM | POA: Insufficient documentation

## 2016-08-10 DIAGNOSIS — J029 Acute pharyngitis, unspecified: Secondary | ICD-10-CM

## 2016-08-10 DIAGNOSIS — Z79899 Other long term (current) drug therapy: Secondary | ICD-10-CM | POA: Insufficient documentation

## 2016-08-10 LAB — CBC WITH DIFFERENTIAL/PLATELET
Basophils Absolute: 0 10*3/uL (ref 0–0.1)
Basophils Relative: 1 %
Eosinophils Absolute: 0 10*3/uL (ref 0–0.7)
Eosinophils Relative: 0 %
HCT: 41.2 % (ref 35.0–47.0)
Hemoglobin: 13.8 g/dL (ref 12.0–16.0)
Lymphocytes Relative: 15 %
Lymphs Abs: 1.2 10*3/uL (ref 1.0–3.6)
MCH: 27.7 pg (ref 26.0–34.0)
MCHC: 33.5 g/dL (ref 32.0–36.0)
MCV: 82.5 fL (ref 80.0–100.0)
Monocytes Absolute: 0.6 10*3/uL (ref 0.2–0.9)
Monocytes Relative: 8 %
Neutro Abs: 6.4 10*3/uL (ref 1.4–6.5)
Neutrophils Relative %: 76 %
Platelets: 248 10*3/uL (ref 150–440)
RBC: 4.99 MIL/uL (ref 3.80–5.20)
RDW: 14.4 % (ref 11.5–14.5)
WBC: 8.3 10*3/uL (ref 3.6–11.0)

## 2016-08-10 LAB — BASIC METABOLIC PANEL
Anion gap: UNDETERMINED (ref 5–15)
BUN: 9 mg/dL (ref 6–20)
CO2: 26 mmol/L (ref 22–32)
Calcium: 9.2 mg/dL (ref 8.9–10.3)
Chloride: 101 mmol/L (ref 101–111)
Creatinine, Ser: 1.31 mg/dL — ABNORMAL HIGH (ref 0.44–1.00)
GFR calc Af Amer: >60 mL/min (ref >60)
GFR calc non Af Amer: 55 mL/min — ABNORMAL LOW (ref >60)
Glucose, Bld: 95 mg/dL (ref 65–99)
Potassium: UNDETERMINED mmol/L (ref 3.5–5.1)

## 2016-08-10 LAB — POCT RAPID STREP A: Streptococcus, Group A Screen (Direct): NEGATIVE

## 2016-08-10 MED ORDER — KETOROLAC TROMETHAMINE 30 MG/ML IJ SOLN
30.0000 mg | Freq: Once | INTRAMUSCULAR | Status: AC
  Administered 2016-08-10: 30 mg via INTRAVENOUS
  Filled 2016-08-10: qty 1

## 2016-08-10 MED ORDER — BENZONATATE 100 MG PO CAPS: 100.0000 mg | ORAL_CAPSULE | Freq: Three times a day (TID) | ORAL | 0 refills | Status: DC | PRN

## 2016-08-10 MED ORDER — GUAIFENESIN-CODEINE 100-10 MG/5ML PO SOLN: 10.0000 mL | ORAL | 0 refills | Status: DC | PRN

## 2016-08-10 MED ORDER — AZITHROMYCIN 250 MG PO TABS: ORAL_TABLET | ORAL | 0 refills | Status: DC

## 2016-08-10 MED ORDER — ONDANSETRON HCL 4 MG/2ML IJ SOLN
4.0000 mg | Freq: Once | INTRAMUSCULAR | Status: AC
  Administered 2016-08-10: 4 mg via INTRAVENOUS
  Filled 2016-08-10: qty 2

## 2016-08-10 MED ORDER — SODIUM CHLORIDE 0.9 % IV BOLUS (SEPSIS)
1000.0000 mL | Freq: Once | INTRAVENOUS | Status: AC
  Administered 2016-08-10: 1000 mL via INTRAVENOUS

## 2016-08-10 NOTE — ED Notes (Signed)
Pt discharged home after verbalizing understanding of discharge instructions; nad noted. 
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
## 2016-08-10 NOTE — ED Triage Notes (Signed)
Sore throat with body aches since last pm  Increased pain with swallowing

## 2016-08-12 LAB — CULTURE, GROUP A STREP (THRC)

## 2016-08-15 ENCOUNTER — Ambulatory Visit: Payer: Self-pay | Admitting: Physical Therapy

## 2016-08-15 DIAGNOSIS — M5440 Lumbago with sciatica, unspecified side: Secondary | ICD-10-CM

## 2016-08-15 DIAGNOSIS — M6281 Muscle weakness (generalized): Secondary | ICD-10-CM

## 2016-08-15 DIAGNOSIS — M5416 Radiculopathy, lumbar region: Secondary | ICD-10-CM

## 2016-08-15 DIAGNOSIS — M5442 Lumbago with sciatica, left side: Secondary | ICD-10-CM

## 2016-08-15 DIAGNOSIS — R531 Weakness: Secondary | ICD-10-CM
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
## 2016-08-22 ENCOUNTER — Ambulatory Visit: Payer: Self-pay | Admitting: Physical Therapy

## 2016-08-22 DIAGNOSIS — M6281 Muscle weakness (generalized): Secondary | ICD-10-CM

## 2016-08-22 DIAGNOSIS — M5416 Radiculopathy, lumbar region: Secondary | ICD-10-CM

## 2016-08-22 DIAGNOSIS — M5442 Lumbago with sciatica, left side: Secondary | ICD-10-CM

## 2016-08-22 DIAGNOSIS — M5440 Lumbago with sciatica, unspecified side: Secondary | ICD-10-CM

## 2016-08-22 DIAGNOSIS — R531 Weakness: Secondary | ICD-10-CM
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
## 2016-08-29 ENCOUNTER — Ambulatory Visit: Payer: Self-pay | Attending: Orthopedic Surgery | Admitting: Physical Therapy

## 2016-08-29 DIAGNOSIS — M6281 Muscle weakness (generalized): Secondary | ICD-10-CM | POA: Insufficient documentation

## 2016-08-29 DIAGNOSIS — M5442 Lumbago with sciatica, left side: Secondary | ICD-10-CM | POA: Insufficient documentation

## 2016-08-29 DIAGNOSIS — M5416 Radiculopathy, lumbar region: Secondary | ICD-10-CM | POA: Insufficient documentation

## 2016-08-29 DIAGNOSIS — G8929 Other chronic pain: Secondary | ICD-10-CM | POA: Insufficient documentation

## 2016-08-29 DIAGNOSIS — R531 Weakness: Secondary | ICD-10-CM | POA: Insufficient documentation

## 2016-08-30 ENCOUNTER — Emergency Department
Admission: EM | Admit: 2016-08-30 | Discharge: 2016-08-30 | Disposition: A | Discharge status: Home / Self Care | Payer: Medicaid Other | Attending: Emergency Medicine | Admitting: Emergency Medicine

## 2016-08-30 ENCOUNTER — Encounter: Payer: Self-pay | Admitting: Emergency Medicine

## 2016-08-30 DIAGNOSIS — F418 Other specified anxiety disorders: Secondary | ICD-10-CM | POA: Insufficient documentation

## 2016-08-30 DIAGNOSIS — Z79899 Other long term (current) drug therapy: Secondary | ICD-10-CM | POA: Insufficient documentation

## 2016-08-30 DIAGNOSIS — J039 Acute tonsillitis, unspecified: Secondary | ICD-10-CM | POA: Insufficient documentation

## 2016-08-30 DIAGNOSIS — I10 Essential (primary) hypertension: Secondary | ICD-10-CM | POA: Insufficient documentation

## 2016-08-30 LAB — POCT RAPID STREP A: Streptococcus, Group A Screen (Direct): NEGATIVE

## 2016-08-30 MED ORDER — LIDOCAINE VISCOUS 2 % MT SOLN
OROMUCOSAL | Status: AC
  Filled 2016-08-30: qty 15

## 2016-08-30 MED ORDER — LIDOCAINE VISCOUS 2 % MT SOLN: 5.0000 mL | Freq: Four times a day (QID) | OROMUCOSAL | 0 refills | Status: DC | PRN

## 2016-08-30 MED ORDER — DIPHENHYDRAMINE HCL 12.5 MG/5ML PO ELIX
25.0000 mg | ORAL_SOLUTION | Freq: Once | ORAL | Status: AC
  Administered 2016-08-30: 25 mg via ORAL
  Filled 2016-08-30: qty 10

## 2016-08-30 MED ORDER — PSEUDOEPH-BROMPHEN-DM 30-2-10 MG/5ML PO SYRP: 5.0000 mL | ORAL_SOLUTION | Freq: Four times a day (QID) | ORAL | 0 refills | Status: DC | PRN

## 2016-08-30 MED ORDER — DIPHENHYDRAMINE HCL 12.5 MG/5ML PO ELIX
ORAL_SOLUTION | ORAL | Status: AC
  Filled 2016-08-30: qty 5

## 2016-08-30 MED ORDER — LIDOCAINE VISCOUS 2 % MT SOLN
15.0000 mL | Freq: Once | OROMUCOSAL | Status: AC
  Administered 2016-08-30: 15 mL via OROMUCOSAL

## 2016-08-30 NOTE — ED Triage Notes (Signed)
Pt with sore throat x3 weeks.
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
## 2016-09-01 ENCOUNTER — Encounter
Admission: RE | Admit: 2016-09-01 | Discharge: 2016-09-01 | Disposition: A | Discharge status: Home / Self Care | Payer: Medicaid Other | Source: Ambulatory Visit | Attending: Unknown Physician Specialty | Admitting: Unknown Physician Specialty

## 2016-09-01 DIAGNOSIS — Z01812 Encounter for preprocedural laboratory examination: Secondary | ICD-10-CM | POA: Insufficient documentation

## 2016-09-01 LAB — BASIC METABOLIC PANEL
Anion gap: 4 — ABNORMAL LOW (ref 5–15)
BUN: 13 mg/dL (ref 6–20)
CO2: 32 mmol/L (ref 22–32)
Calcium: 9.3 mg/dL (ref 8.9–10.3)
Chloride: 104 mmol/L (ref 101–111)
Creatinine, Ser: 0.7 mg/dL (ref 0.44–1.00)
GFR calc Af Amer: >60 mL/min (ref >60)
GFR calc non Af Amer: >60 mL/min (ref >60)
Glucose, Bld: 93 mg/dL (ref 65–99)
Potassium: 3.6 mmol/L (ref 3.5–5.1)
Sodium: 140 mmol/L (ref 135–145)

## 2016-09-01 NOTE — Pre-Procedure Instructions (Signed)
Cardiac clearance from Dr. Juliann Pares in on chart, pt is low risk.

## 2016-09-01 NOTE — Patient Instructions (Signed)
  Your procedure is scheduled WU:JWJXBJYon:Tuesday Oct.10, 2017. Report to Same Day Surgery. To find out your arrival time please call [phone] between 1PM - 3PM on Monday Oct. 9, 2017.  Remember: Instructions that are not followed completely may result in serious medical risk, up to and including death, or upon the discretion of your surgeon and anesthesiologist your surgery may need to be rescheduled.    _x___ 1. Do not eat food or drink liquids after midnight. No gum chewing or hard candies.     ____ 2. No Alcohol for 24 hours before or after surgery.   ____ 3. Bring all medications with you on the day of surgery if instructed.    __x__ 4. Notify your doctor if there is any change in your medical condition     (cold, fever, infections).    _____ 5. No smoking 24 hours prior to surgery.     Do not wear jewelry, make-up, hairpins, clips or nail polish.  Do not wear lotions, powders, or perfumes.   Do not shave 48 hours prior to surgery. Men may shave face and neck.  Do not bring valuables to the hospital.    [facility] is not responsible for any belongings or valuables.               Contacts, dentures or bridgework may not be worn into surgery.  Leave your suitcase in the car. After surgery it may be brought to your room.  For patients admitted to the hospital, discharge time is determined by your treatment team.   Patients discharged the day of surgery will not be allowed to drive home.    Please read over the following fact sheets that you were given:   [facility] Preparing for Surgery  ____ Take these medicines the morning of surgery with A SIP OF WATER:    1. Carbamazepine (EQUETRO)  2. Dexlansoprazole  3. diltiazem (CARDIZEM CD)  4. gabapentin (NEURONTIN)  5.metoprolol (LOPRESSOR)  6.predniSONE (DELTASONE)   ____ Fleet Enema (as directed)   ____ Use CHG Soap as directed on instruction sheet  __x__ Use inhalers on the day of surgery and bring to hospital day of  surgery  ____ Stop metformin 2 days prior to surgery    ____ Take 1/2 of usual insulin dose the night before surgery and none on the morning of surgery.   ____ Stop Coumadin/Plavix/aspirin on does not apply.  _x___ Stop Anti-inflammatories such as Advil, Aleve, Ibuprofen, Motrin, Naproxen, Naprosyn, Goodies powders or aspirin products. OK to take Tylenol.   ____ Stop supplements:orlistat (XENICAL) &celecoxib (CELEBREX) per Dr. Mikey BussingMcQueen's office  until after surgery.    ____ Bring C-Pap to the hospital.

## 2016-09-05 ENCOUNTER — Ambulatory Visit: Payer: Self-pay | Admitting: Physical Therapy

## 2016-09-05 ENCOUNTER — Ambulatory Visit: Payer: Self-pay | Admitting: Anesthesiology

## 2016-09-05 ENCOUNTER — Encounter: Payer: Self-pay | Admitting: *Deleted

## 2016-09-05 ENCOUNTER — Encounter
Admission: RE | Disposition: A | Discharge status: Home / Self Care | Payer: Self-pay | Source: Ambulatory Visit | Attending: Unknown Physician Specialty

## 2016-09-05 ENCOUNTER — Ambulatory Visit
Admission: RE | Admit: 2016-09-05 | Discharge: 2016-09-05 | Disposition: A | Discharge status: Home / Self Care | Payer: Self-pay | Source: Ambulatory Visit | Attending: Unknown Physician Specialty | Admitting: Unknown Physician Specialty

## 2016-09-05 DIAGNOSIS — I10 Essential (primary) hypertension: Secondary | ICD-10-CM | POA: Insufficient documentation

## 2016-09-05 DIAGNOSIS — J3501 Chronic tonsillitis: Secondary | ICD-10-CM | POA: Insufficient documentation

## 2016-09-05 DIAGNOSIS — G4730 Sleep apnea, unspecified: Secondary | ICD-10-CM | POA: Insufficient documentation

## 2016-09-05 HISTORY — PX: TONSILLECTOMY AND ADENOIDECTOMY: SHX28

## 2016-09-05 LAB — POCT PREGNANCY, URINE: Preg Test, Ur: NEGATIVE

## 2016-09-05 SURGERY — TONSILLECTOMY AND ADENOIDECTOMY
Anesthesia: General

## 2016-09-05 MED ORDER — OXYCODONE-ACETAMINOPHEN 5-325 MG PO TABS: 1.0000 | ORAL_TABLET | ORAL | 0 refills | Status: DC | PRN

## 2016-09-05 MED ORDER — GLYCOPYRROLATE 0.2 MG/ML IJ SOLN
INTRAMUSCULAR | Status: DC | PRN
  Administered 2016-09-05: 0.2 mg via INTRAVENOUS

## 2016-09-05 MED ORDER — LABETALOL HCL 5 MG/ML IV SOLN
INTRAVENOUS | Status: AC
  Filled 2016-09-05: qty 4

## 2016-09-05 MED ORDER — FENTANYL CITRATE (PF) 100 MCG/2ML IJ SOLN
INTRAMUSCULAR | Status: AC
  Administered 2016-09-05: 25 ug via INTRAVENOUS
  Filled 2016-09-05: qty 2

## 2016-09-05 MED ORDER — SUCCINYLCHOLINE CHLORIDE 20 MG/ML IJ SOLN
INTRAMUSCULAR | Status: DC | PRN
  Administered 2016-09-05: 140 mg via INTRAVENOUS

## 2016-09-05 MED ORDER — FENTANYL CITRATE (PF) 100 MCG/2ML IJ SOLN
25.0000 ug | INTRAMUSCULAR | Status: AC | PRN
  Administered 2016-09-05 (×6): 25 ug via INTRAVENOUS

## 2016-09-05 MED ORDER — MIDAZOLAM HCL 2 MG/2ML IJ SOLN
INTRAMUSCULAR | Status: DC | PRN
Start: 2016-09-05 — End: 2016-09-05
  Administered 2016-09-05: 2 mg via INTRAVENOUS

## 2016-09-05 MED ORDER — OXYCODONE-ACETAMINOPHEN 5-325 MG PO TABS
ORAL_TABLET | ORAL | Status: AC
  Filled 2016-09-05: qty 2

## 2016-09-05 MED ORDER — ONDANSETRON HCL 4 MG/2ML IJ SOLN
INTRAMUSCULAR | Status: DC | PRN
  Administered 2016-09-05: 4 mg via INTRAVENOUS

## 2016-09-05 MED ORDER — PROPOFOL 10 MG/ML IV BOLUS
INTRAVENOUS | Status: DC | PRN
  Administered 2016-09-05: 200 mg via INTRAVENOUS

## 2016-09-05 MED ORDER — LABETALOL HCL 5 MG/ML IV SOLN
10.0000 mg | Freq: Once | INTRAVENOUS | Status: AC
  Administered 2016-09-05: 10 mg via INTRAVENOUS

## 2016-09-05 MED ORDER — SODIUM CHLORIDE 0.9 % IV SOLN
INTRAVENOUS | Status: DC
  Administered 2016-09-05: 08:00:00 via INTRAVENOUS

## 2016-09-05 MED ORDER — LIDOCAINE HCL (CARDIAC) 20 MG/ML IV SOLN
INTRAVENOUS | Status: DC | PRN
  Administered 2016-09-05: 100 mg via INTRAVENOUS

## 2016-09-05 MED ORDER — OXYCODONE-ACETAMINOPHEN 5-325 MG PO TABS
1.0000 | ORAL_TABLET | ORAL | Status: DC | PRN
  Administered 2016-09-05: 2 via ORAL

## 2016-09-05 MED ORDER — BUPIVACAINE HCL (PF) 0.5 % IJ SOLN
INTRAMUSCULAR | Status: AC
  Filled 2016-09-05: qty 30

## 2016-09-05 MED ORDER — BUPIVACAINE HCL 0.5 % IJ SOLN
INTRAMUSCULAR | Status: DC | PRN
  Administered 2016-09-05: 7 mL

## 2016-09-05 MED ORDER — FENTANYL CITRATE (PF) 100 MCG/2ML IJ SOLN
INTRAMUSCULAR | Status: DC | PRN
Start: 2016-09-05 — End: 2016-09-05
  Administered 2016-09-05 (×2): 50 ug via INTRAVENOUS

## 2016-09-05 MED ORDER — LABETALOL HCL 5 MG/ML IV SOLN
10.0000 mg | INTRAVENOUS | Status: AC | PRN
  Administered 2016-09-05 (×2): 10 mg via INTRAVENOUS

## 2016-09-05 MED ORDER — LABETALOL HCL 5 MG/ML IV SOLN
INTRAVENOUS | Status: AC
  Administered 2016-09-05: 10 mg via INTRAVENOUS
  Filled 2016-09-05: qty 4

## 2016-09-05 MED ORDER — EPHEDRINE SULFATE 50 MG/ML IJ SOLN
INTRAMUSCULAR | Status: DC | PRN
  Administered 2016-09-05: 15 mg via INTRAVENOUS

## 2016-09-05 MED ORDER — ONDANSETRON HCL 4 MG/2ML IJ SOLN: 4.0000 mg | Freq: Once | INTRAMUSCULAR | Status: DC | PRN

## 2016-09-05 SURGICAL SUPPLY — 15 items
CANISTER SUCT 1200ML W/VALVE (MISCELLANEOUS) ×2 IMPLANT
CATH ROBINSON RED A/P 8FR (CATHETERS) IMPLANT
COAG SUCT 10F 3.5MM HAND CTRL (MISCELLANEOUS) ×2 IMPLANT
ELECT CAUTERY BLADE TIP 2.5 (TIP) ×2
ELECT REM PT RETURN 9FT ADLT (ELECTROSURGICAL) ×2
ELECTRODE CAUTERY BLDE TIP 2.5 (TIP) ×1 IMPLANT
ELECTRODE REM PT RTRN 9FT ADLT (ELECTROSURGICAL) ×1 IMPLANT
GLOVE BIO SURGEON STRL SZ7.5 (GLOVE) ×2 IMPLANT
HANDLE SUCTION POOLE (INSTRUMENTS) ×1 IMPLANT
NS IRRIG 500ML POUR BTL (IV SOLUTION) ×2 IMPLANT
PACK HEAD/NECK (MISCELLANEOUS) ×2 IMPLANT
SOL ANTI-FOG 6CC FOG-OUT (MISCELLANEOUS) ×1 IMPLANT
SOL FOG-OUT ANTI-FOG 6CC (MISCELLANEOUS) ×1
SPONGE TONSIL 1 RF SGL (DISPOSABLE) ×2 IMPLANT
SUCTION POOLE HANDLE (INSTRUMENTS) ×2

## 2016-09-05 NOTE — Transfer of Care (Signed)
Immediate Anesthesia Transfer of Care Note  Patient: Andrea Miller  Procedure(s) Performed: Procedure(s): TONSILLECTOMY AND ADENOIDECTOMY (N/A)  Patient Location: PACU  Anesthesia Type:General  Level of Consciousness: sedated  Airway & Oxygen Therapy: Patient Spontanous Breathing and Patient connected to face mask oxygen  Post-op Assessment: Report given to RN and Post -op Vital signs reviewed and stable  Post vital signs: Reviewed and stable  Last Vitals:  Vitals:   09/05/16 0716 09/05/16 0923  BP: 135/87 (!) 165/100  Pulse: 68 91  Resp: 18 (!) 25  Temp: 37.3 C 36.5 C    Complications: No apparent anesthesia complications

## 2016-09-05 NOTE — Op Note (Signed)
PREOPERATIVE DIAGNOSIS:  chronic tonsillitis  POSTOPERATIVE DIAGNOSIS: Same  OPERATION:  Tonsillectomy and adenoidectomy.  SURGEON:  Davina Poke, MD  ANESTHESIA:  General endotracheal.  OPERATIVE FINDINGS:  Large tonsils and adenoids.  DESCRIPTION OF THE PROCEDURE:  Andrea Miller was identified in the holding area and taken to the operating room and placed in the supine position.  After general endotracheal anesthesia, the table was turned 45 degrees and the patient was draped in the usual fashion for a tonsillectomy.  A mouth gag was inserted into the oral cavity and examination of the oropharynx showed the uvula was non-bifid.  There was no evidence of submucous cleft to the palate.  There were large tonsils.  A red rubber catheter was placed through the nostril.  Examination of the nasopharynx showed large obstructing adenoids.  Under indirect vision with the mirror, an adenotome was placed in the nasopharynx.  The adenoids were curetted free.  Reinspection with a mirror showed excellent removal of the adenoid.  Nasopharyngeal packs were then placed.  The operation then turned to the tonsillectomy.  Beginning on the left-hand side a tenaculum was used to grasp the tonsil and the Bovie cautery was used to dissect it free from the fossa.  In a similar fashion, the right tonsil was removed.  Meticulous hemostasis was achieved using the Bovie cautery.  With both tonsils removed and no active bleeding, the nasopharyngeal packs were removed.  Suction cautery was then used to cauterize the nasopharyngeal bed to prevent bleeding.  The red rubber catheter was removed with no active bleeding.  0.5% plain Marcaine was used to inject the anterior and posterior tonsillar pillars bilaterally.  A total of 65ml was used.  The patient tolerated the procedure well and was awakened in the operating room and taken to the recovery room in stable condition.   CULTURES:  None.  SPECIMENS:  Tonsils and  adenoids.  ESTIMATED BLOOD LOSS:  Less than 20 ml.  Andrea Miller T  09/05/2016  9:16 AM

## 2016-09-05 NOTE — Anesthesia Preprocedure Evaluation (Addendum)
Anesthesia Evaluation  Patient identified by MRN, date of birth, ID band Patient awake    Reviewed: Allergy & Precautions, H&P , NPO status , Patient's Chart, lab work & pertinent test results, reviewed documented beta blocker date and time   History of Anesthesia Complications Negative for: history of anesthetic complications  Airway Mallampati: II  TM Distance: >3 FB Neck ROM: full    Dental  (+) Poor Dentition, Teeth Intact   Pulmonary sleep apnea (has not yet been fitted for CPAP) , neg COPD,    Pulmonary exam normal breath sounds clear to auscultation- rhonchi (-) wheezing      Cardiovascular hypertension, Pt. on medications (-) CAD and (-) Past MI Normal cardiovascular exam+ dysrhythmias Supra Ventricular Tachycardia  Rhythm:regular Rate:Normal - Systolic murmurs and - Diastolic murmurs    Neuro/Psych Seizures -, Well Controlled,  PSYCHIATRIC DISORDERS Anxiety Depression Bipolar Disorder  Neuromuscular disease    GI/Hepatic negative GI ROS, Neg liver ROS,   Endo/Other  negative endocrine ROSneg diabetes  Renal/GU negative Renal ROS  negative genitourinary   Musculoskeletal  (+) Fibromyalgia -  Abdominal (+) + obese,   Peds  Hematology negative hematology ROS (+)   Anesthesia Other Findings Past Medical History: No date: Anxiety No date: Bipolar disorder (HCC) No date: Chest pain     Comment: and angina No date: Depression No date: Fibromyalgia No date: Hypertension since 08/2014: Left lumbar radiculopathy     Comment: secondary to work accident No date: Obesity No date: Seizures (HCC)     Comment: secondary to anxiety No date: SVT (supraventricular tachycardia) (HCC)     Comment: with hx of syncope; managed by [facility] Past Surgical History: No date: NO PAST SURGERIES   Reproductive/Obstetrics                            Anesthesia Physical Anesthesia Plan  ASA:  III  Anesthesia Plan: General   Post-op Pain Management:    Induction: Intravenous  Airway Management Planned: Oral ETT  Additional Equipment:   Intra-op Plan:   Post-operative Plan: Extubation in OR  Informed Consent: I have reviewed the patients History and Physical, chart, labs and discussed the procedure including the risks, benefits and alternatives for the proposed anesthesia with the patient or authorized representative who has indicated his/her understanding and acceptance.   Dental Advisory Given  Plan Discussed with: CRNA and Anesthesiologist  Anesthesia Plan Comments:        Anesthesia Quick Evaluation

## 2016-09-05 NOTE — H&P (Signed)
  H+P  Reviewed and will be scanned in later. No changes noted. 

## 2016-09-05 NOTE — Discharge Instructions (Signed)

## 2016-09-05 NOTE — Anesthesia Postprocedure Evaluation (Signed)
Anesthesia Post Note  Patient: Andrea Miller  Procedure(s) Performed: Procedure(s) (LRB): TONSILLECTOMY AND ADENOIDECTOMY (N/A)  Patient location during evaluation: PACU Anesthesia Type: General Level of consciousness: awake and alert and oriented Pain management: pain level controlled Vital Signs Assessment: post-procedure vital signs reviewed and stable Respiratory status: spontaneous breathing, nonlabored ventilation and respiratory function stable Cardiovascular status: blood pressure returned to baseline and stable Postop Assessment: no signs of nausea or vomiting Anesthetic complications: no    Last Vitals:  Vitals:   09/05/16 1024 09/05/16 1030  BP: (!) 154/101 (!) 172/117  Pulse: 80   Resp: 10 (!) 24  Temp:      Last Pain:  Vitals:   09/05/16 1015  TempSrc:   PainSc: 6                  Anjanette Gilkey

## 2016-09-05 NOTE — Anesthesia Procedure Notes (Signed)
Procedure Name: Intubation Date/Time: 09/05/2016 8:50 AM Performed by: Stormy FabianURTIS, Avonelle Viveros Pre-anesthesia Checklist: Patient identified, Patient being monitored, Timeout performed, Emergency Drugs available and Suction available Patient Re-evaluated:Patient Re-evaluated prior to inductionOxygen Delivery Method: Circle system utilized Preoxygenation: Pre-oxygenation with 100% oxygen Intubation Type: IV induction Ventilation: Mask ventilation without difficulty Laryngoscope Size: Mac, 3 and McGraph Grade View: Grade I Tube type: Oral Rae Tube size: 7.5 mm Number of attempts: 1 Airway Equipment and Method: Stylet Placement Confirmation: ETT inserted through vocal cords under direct vision,  positive ETCO2 and breath sounds checked- equal and bilateral Secured at: 21 cm Tube secured with: Tape Dental Injury: Teeth and Oropharynx as per pre-operative assessment

## 2016-09-05 NOTE — OR Nursing (Signed)
Dr. Jenne Campus in to see pt and speak to mom just prior to d/c

## 2016-09-06 LAB — SURGICAL PATHOLOGY

## 2016-09-12 ENCOUNTER — Ambulatory Visit: Payer: Self-pay | Admitting: Physical Therapy

## 2016-09-12 DIAGNOSIS — M5442 Lumbago with sciatica, left side: Secondary | ICD-10-CM

## 2016-09-12 DIAGNOSIS — G8929 Other chronic pain: Secondary | ICD-10-CM

## 2016-09-12 DIAGNOSIS — M5416 Radiculopathy, lumbar region: Secondary | ICD-10-CM

## 2016-09-12 DIAGNOSIS — M6281 Muscle weakness (generalized): Secondary | ICD-10-CM

## 2016-09-12 DIAGNOSIS — R531 Weakness: Secondary | ICD-10-CM

## 2016-09-12 NOTE — Therapy (Signed)
[facility] MAIN [facility] [address] [place], [place], [address] Phone: [phone]   Fax:  [phone]  Physical Therapy Treatment  Patient Details  Name: Andrea Miller MRN: [id number] Date of Birth: 1986-07-19 Referring Provider: Frederik Pearaniel Cavanaugh  Encounter Date: 09/12/2016      PT End of Session - 09/12/16 1459    Visit Number 13   Number of Visits 18   Date for PT Re-Evaluation 11/07/16   PT Start Time 0940   PT Stop Time 1015   PT Time Calculation (min) 35 min   Activity Tolerance Patient tolerated treatment well;No increased pain   Behavior During Therapy WFL for tasks assessed/performed      Past Medical History:  Diagnosis Date  . Anxiety   . Bipolar disorder (HCC)   . Chest pain    and angina  . Depression   . Fibromyalgia   . Hypertension   . 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 Obesity   . Seizures (HCC)    secondary to anxiety  . SVT (supraventricular tachycardia) (HCC)    with hx of syncope; [facility]    Past Surgical History:  Procedure Laterality Date  . NO PAST SURGERIES    . TONSILLECTOMY AND ADENOIDECTOMY N/A 09/05/2016   Procedure: TONSILLECTOMY AND ADENOIDECTOMY;  Surgeon: Linus Salmonshapman McQueen, MD;  Location: [facility] ORS;  Service: ENT;  Laterality: N/A;    There were no vitals filed for this visit.      Subjective Assessment - 09/12/16 1457    Subjective Pt reported she had her tonsils taken out on 09/05/16. She has been laying in bed since the surgery but has been doing her exercises in bed.     Pertinent History Patient was at work, was getting a box from the top shelf which fell onto her back on September 09, 2014. [facility] which [facility] not help. Scheduled for an MRI May 18, 2015. Trying to get back to physical therapy, however, secondary to patient trying not to undergo back surgery. Denies bowel or bladder problems,  States having tingling and numbness low back and L lateral thigh towards the dorsal surface of her foot (around L 5 dermatome).  Aggravating factors include bending over, putting pressure on her back such as sitting down on a hard chair, laying down straight on her back. No known relieving factors. Patient also adds having R knee pain from twising her R knee. Able to sit on a chair for 10-15 minutes. Was on work restriction since her injury and wants to get back to work. 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                      Adult Aquatic Therapy - 09/12/16 1459      Aquatic Therapy Subjective   Subjective pt had no complaints         O: Pt entered/exited the pool via ramp with UE support on rail.  50 ft =1 lap  Exercises performed in 3'6" depth  Stretches seated on the bench 2 laps : Seated on noodle sidestepping with cuing for knee alignment to decrease genu valgus  20 ft x 2 with single UE on rail: seated on noodle: walking backwards   5' relaxation guided mindfulness performed in 4'0" depth : leaning against the wall with  noodles behind back./ neck.  Relaxation was abbreviated due to pt's c/o dry mouth from her tonsil surgery.   Modified exercises per pt's request to not put her head in the water 2/2 s/p tonsillectomy                     PT Long Term Goals - 08/15/16 1228      PT LONG TERM GOAL #1   Title Pt. will decrease MODI to <60% to improve self-perceived disability.    Baseline Modified Oswestry: 96% self-perceived bed mobility. (06/29/16)   Time 12   Period Weeks   Status On-going     PT LONG TERM GOAL #2   Title Pt. I with HEP to increase B LE muscle strength 1/2 MMT to improve standing tolerance/ walking to promote mobility.    Baseline Pt. presents with good B UE/LE AROM (all planes).  B LE muscle weakness (L/R): hip flexion (4+)- increase L hip and back pain, ext. (4+/4), knee flexion (4-/4-),  hip abd. (4/4)-  06/29/16   Time 12   Period Weeks   Status On-going     PT LONG TERM GOAL #3   Title Pt. able to complete 30 minutes of aquatic based PT with no increase c/o back pain.     Baseline Limited to <15 min. of standing due to severe c/o pain.    Time 12   Period Weeks   Status On-going     PT LONG TERM GOAL #4   Title Pt. will report <7/10 low back pain at worst with ther.ex. program in pool or land activites.     Baseline >10/10 pain reported on a consistent basis with all tasks.    Time 12   Period Weeks   Status On-going     PT LONG TERM GOAL #5   Title Pt will be able to tolerate 30-35 min of aquatic Tx across 3 weeks without fatigue that debilitates pt for > 2 days in order to demo increased endurance and tolerance to graded movement routines for fitness and ADLs.   Time 12   Period Weeks   Status New               Plan - 09/12/16 1500    Clinical Impression Statement Pt returned to PT today after 2 weeks and showed improved gait and increased IND with exercises. Pt advanced to seated exercises and will continue to benefit from skilled PT.    Rehab Potential Fair   PT Frequency 2x / week   PT Duration 12 weeks   PT Treatment/Interventions ADLs/Self Care Home Management;Ultrasound;Aquatic Therapy;Neuromuscular re-education;Balance training;Passive range of motion;Patient/family education;Gait training;Cryotherapy;Electrical Stimulation;Therapeutic activities;Taping;Manual techniques;Therapeutic exercise;Moist Heat   Consulted and Agree with Plan of Care Patient      Patient will benefit from skilled therapeutic intervention in order to improve the following deficits and impairments:  Abnormal gait, Difficulty walking, Pain, Improper body mechanics, Decreased strength, Obesity, Decreased range of motion, Impaired flexibility, Postural dysfunction  Visit Diagnosis: Lumbar radiculopathy  Muscle weakness (generalized)  Chronic bilateral low back pain with  left-sided sciatica  Weakness     Problem List There are no active problems to display for this patient.   Mariane Masters ,PT, DPT, E-RYT  09/12/2016, 3:02 PM  [facility] MAIN [facility] SERVICES [address] [place], [place], [address] Phone: [phone]   Fax:  [phone]  Name: Andrea Miller MRN: [id number] Date of Birth: January 04, 1986

## 2016-09-14 ENCOUNTER — Ambulatory Visit: Payer: Self-pay

## 2016-09-21 ENCOUNTER — Ambulatory Visit: Payer: Medicaid Other | Admitting: Physical Therapy

## 2016-09-28 ENCOUNTER — Ambulatory Visit: Payer: Medicaid Other | Attending: Orthopedic Surgery | Admitting: Physical Therapy

## 2016-10-05 ENCOUNTER — Ambulatory Visit: Payer: Self-pay | Admitting: Physical Therapy

## 2016-10-09 ENCOUNTER — Encounter: Payer: Self-pay | Admitting: Physical Therapy

## 2016-10-10 ENCOUNTER — Ambulatory Visit: Payer: Self-pay | Admitting: Physical Therapy

## 2016-10-13 ENCOUNTER — Encounter: Payer: Self-pay | Admitting: Physical Therapy

## 2016-10-24 ENCOUNTER — Encounter: Payer: Self-pay | Admitting: Physical Therapy

## 2016-10-25 ENCOUNTER — Encounter: Payer: Self-pay | Admitting: Physical Therapy

## 2016-10-30 ENCOUNTER — Encounter: Payer: Self-pay | Admitting: Physical Therapy

## 2016-10-30 ENCOUNTER — Telehealth: Payer: Self-pay | Admitting: Physical Therapy

## 2016-10-30 NOTE — Telephone Encounter (Signed)
Aquatic PT communicated with pt re: missing her last appt. Pt stated she has been having heart issue with rapid heartbeat for the past two months. Pt has been using a heart monitor and will be following up with her cardiologist later in Dec. Pt reported her LBP Sx improved " A tiny bit better" 1/7 based on the GROC scale.  Aquatic PT explained that she will update PT who evaluated pt to discuss possibility of d/c current case or get a new order to continue treating her LBP with specific guidelines/ restrictions for exercise related to her cardiac conditions. Pt voiced understanding.

## 2016-11-09 ENCOUNTER — Telehealth: Payer: Self-pay | Admitting: Pharmacist

## 2016-11-09 ENCOUNTER — Encounter: Payer: Self-pay | Admitting: *Deleted

## 2016-11-09 ENCOUNTER — Emergency Department
Admission: EM | Admit: 2016-11-09 | Discharge: 2016-11-09 | Disposition: A | Discharge status: Home / Self Care | Payer: Medicaid Other | Attending: Emergency Medicine | Admitting: Emergency Medicine

## 2016-11-09 DIAGNOSIS — Z79899 Other long term (current) drug therapy: Secondary | ICD-10-CM | POA: Insufficient documentation

## 2016-11-09 DIAGNOSIS — K0889 Other specified disorders of teeth and supporting structures: Secondary | ICD-10-CM

## 2016-11-09 DIAGNOSIS — I10 Essential (primary) hypertension: Secondary | ICD-10-CM | POA: Insufficient documentation

## 2016-11-09 DIAGNOSIS — B349 Viral infection, unspecified: Secondary | ICD-10-CM

## 2016-11-09 MED ORDER — KETOROLAC TROMETHAMINE 30 MG/ML IJ SOLN
30.0000 mg | Freq: Once | INTRAMUSCULAR | Status: AC
  Administered 2016-11-09: 30 mg via INTRAMUSCULAR
  Filled 2016-11-09: qty 1

## 2016-11-09 MED ORDER — IBUPROFEN 800 MG PO TABS: 800.0000 mg | ORAL_TABLET | Freq: Three times a day (TID) | ORAL | 0 refills | Status: DC | PRN

## 2016-11-09 NOTE — ED Notes (Addendum)
Pt reports for the last 4-5 days she has had a "head ache, eye pressure, body aches, and left ear pain". Pt states she has a hx of having migraines. Pt states her tooth feels "pressure when cold air hits it" which she states started yesterday. Pt denies fever but reports she does not have a thermometer at home. Pt also reports not having an appetite & denies vomiting and diarrhea.

## 2016-11-09 NOTE — Telephone Encounter (Signed)
Pt brought in RX from ED today for IBU 800mg  TID prn. Pt has been on Celebrex 200mg  po BID. S/W pt to make sure she understood she can not take both Celebrex and IBU or this could put her as risk for a GI bleed. Explained that if the celebrex did not help the pain that it was likely that the IBU would not either. She states she has taken IBU and tizanidine before and it worked. Re-stated that she would need to stop celebrex. Pt v/u and stated she would not take both.

## 2016-11-09 NOTE — ED Triage Notes (Signed)
Pt complains of left mouth/ dental pain with left ear pain

## 2016-11-09 NOTE — ED Provider Notes (Signed)
[facility] Emergency Department Provider Note  ____________________________________________  Time seen: Approximately 1:28 PM  I have reviewed the triage vital signs and the nursing notes.   HISTORY  Chief Complaint Dental Pain    HPI Andrea Miller is a 30 y.o. female , NAD, presents to emergency department for evaluation of multiple complaints. Patient states that she has not felt well over the last 4-5 days. Has had bilateral ear pain and pressure, sinus pressure, headache and is noted right anterior and lower dental pain over the last couple days. Denies any injury or trauma to the face or neck. States she does have history of migraines but states this headache is not as severe as previous migraine headaches. Has been taking her chronic medications which include headache medicines and allergy medications as prescribed by her primary care provider but states they're not helping with her headache. Has not had any chest pain, shortness breath, wheezing, abdominal pain, nausea or vomiting. Denies any numbness, weakness, tingling. Has not had any cough or chest congestion. Patient states that her tooth pain increases when cold air or cold objects touch the tooth in question.   Past Medical History:  Diagnosis Date  . Anxiety   . Bipolar disorder (HCC)   . Chest pain    and angina  . Depression   . Fibromyalgia   . Hypertension   . 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 Obesity   . Seizures (HCC)    secondary to anxiety  . SVT (supraventricular tachycardia) (HCC)    with hx of syncope; [facility]    There are no active problems to display for this patient.   Past Surgical History:  Procedure Laterality Date  . NO PAST SURGERIES    . TONSILLECTOMY AND ADENOIDECTOMY N/A 09/05/2016   Procedure: TONSILLECTOMY AND ADENOIDECTOMY;  Surgeon: Linus Salmonshapman McQueen, MD;  Location: [facility] ORS;  Service: ENT;  Laterality: N/A;     Prior to Admission medications   Medication Sig Start Date End Date Taking? Authorizing Provider  albuterol (PROVENTIL) (2.5 MG/3ML) 0.083% nebulizer solution Take 2.5 mg by nebulization Miller 4 (four) hours as needed for wheezing or shortness of breath.    Historical Provider, MD  amphetamine-dextroamphetamine (ADDERALL XR) 15 MG 24 hr capsule Take 15 mg by mouth Miller morning.    Historical Provider, MD  beclomethasone (QVAR) 40 MCG/ACT inhaler Inhale 2 puffs into the lungs 2 (two) times daily.    Historical Provider, MD  benzonatate (TESSALON PERLES) 100 MG capsule Take 1 capsule (100 mg total) by mouth 3 (three) times daily as needed for cough. Patient not taking: Reported on 09/05/2016 08/10/16   Evangeline Dakinharles M Beers, PA-C  Carbamazepine (EQUETRO) 300 MG CP12 Take by mouth Miller morning.    Historical Provider, MD  Cariprazine HCl (VRAYLAR) 3 MG CAPS Take by mouth at bedtime.    Historical Provider, MD  celecoxib (CELEBREX) 100 MG capsule Take 100 mg by mouth 2 (two) times daily.    Historical Provider, MD  clonazePAM (KLONOPIN) 2 MG tablet Take 2 mg by mouth at bedtime as needed for anxiety.    Historical Provider, MD  Dexlansoprazole 30 MG capsule Take 30 mg by mouth Miller morning.    Historical Provider, MD  diltiazem (CARDIZEM CD) 240 MG 24 hr capsule Take 240 mg by mouth Miller morning.    Historical Provider, MD  furosemide (LASIX) 20 MG tablet Take 20 mg by mouth Miller morning.  Historical Provider, MD  gabapentin (NEURONTIN) 300 MG capsule Take 300 mg by mouth 3 (three) times daily.    Historical Provider, MD  ibuprofen (ADVIL,MOTRIN) 800 MG tablet Take 1 tablet (800 mg total) by mouth Miller 8 (eight) hours as needed (pain). 11/09/16   Nickey Canedo L Mikalah Skyles, PA-C  lidocaine (XYLOCAINE) 2 % solution Use as directed 5 mLs in the mouth or throat Miller 6 (six) hours as needed for mouth pain. Mixed with 5 ML's all Bromfed-DM for swish and swallow 08/30/16   Joni Reining, PA-C  metoprolol  (LOPRESSOR) 100 MG tablet Take 200 mg by mouth Miller morning.  09/21/14   Historical Provider, MD  Multiple Vitamins-Minerals (MULTIVITAMIN WITH MINERALS) tablet Take 1 tablet by mouth daily.    Historical Provider, MD  orlistat (XENICAL) 120 MG capsule Take 120 mg by mouth 3 (three) times daily with meals.    Historical Provider, MD  oxyCODONE-acetaminophen (ROXICET) 5-325 MG tablet Take 1-2 tablets by mouth Miller 4 (four) hours as needed for severe pain. 09/05/16   Linus Salmons, MD  potassium chloride (K-DUR) 10 MEQ tablet Take 10 mEq by mouth Miller morning.    Historical Provider, MD  tiZANidine (ZANAFLEX) 4 MG capsule Take 4 mg by mouth 3 (three) times daily as needed for muscle spasms.    Historical Provider, MD    Allergies Cortizone-10 [hydrocortisone]; Garlic; Penicillins; and Corticosteroids  Family History  Problem Relation Age of Onset  . Diabetes Mother   . Hypertension Mother   . Asthma Mother   . Diabetes Father   . Hypertension Father     Social History Social History  Substance Use Topics  . Smoking status: Never Smoker  . Smokeless tobacco: Never Used  . Alcohol use No     Review of Systems Constitutional: No fever/chills Eyes: No visual changes. No discharge ENT: Positive sinus pressure, bilateral ear pain, nasal congestion and runny nose Cardiovascular: No chest pain. Respiratory: No cough, chest congestion. No shortness of breath. No wheezing.  Gastrointestinal: No abdominal pain.  No nausea, vomiting.   Musculoskeletal: Positive for general myalgias.  Skin: Negative for rash. Neurological: Positive for headaches, but no focal weakness or numbness. 10-point ROS otherwise negative.  ____________________________________________   PHYSICAL EXAM:  VITAL SIGNS: ED Triage Vitals  Enc Vitals Group     BP 11/09/16 1302 (!) 161/84     Pulse Rate 11/09/16 1302 100     Resp 11/09/16 1302 20     Temp 11/09/16 1302 99 F (37.2 C)     Temp Source  11/09/16 1302 Oral     SpO2 11/09/16 1302 100 %     Weight 11/09/16 1303 (!) 340 lb (154.2 kg)     Height 11/09/16 1303 5\' 10"  (1.778 m)     Head Circumference --      Peak Flow --      Pain Score 11/09/16 1303 8     Pain Loc --      Pain Edu? --      Excl. in GC? --      Constitutional: Alert and oriented. Well appearing and in no acute distress. Eyes: Conjunctivae are normal Without icterus, injection or discharge. Head: Atraumatic. ENT:      Ears: Bilateral TMs visualized with moderate effusion and mild bulging but no perforation or erythema. Bilateral external ear canals without erythema, swelling or discharge.      Nose: Moderate congestion with clear rhinorrhea. Bilateral turbinates are injected.  Mouth/Throat: Mucous membranes are moist. Pharynx without erythema, swelling, exudate. Uvula is midline. Airways patent. Clear postnasal drip. Gum lines without erythema, swelling or tenderness to palpation. Good dentition throughout. No overt caries. Neck: No stridor. Supple with full range of motion. Hematological/Lymphatic/Immunilogical: No cervical lymphadenopathy. Cardiovascular: Normal rate, regular rhythm. Normal S1 and S2.  Good peripheral circulation. Respiratory: Normal respiratory effort without tachypnea or retractions. Lungs CTAB with breath sounds noted in all lung fields. No wheeze, rhonchi, rales Neurologic:  Normal speech and language. No gross focal neurologic deficits are appreciated.  Skin:  Skin is warm, dry and intact. No rash noted. Psychiatric: Mood and affect are normal. Speech and behavior are normal. Patient exhibits appropriate insight and judgement.   ____________________________________________   LABS  None ____________________________________________  EKG  None ____________________________________________  RADIOLOGY  None ____________________________________________    PROCEDURES  Procedure(s) performed:  None   Procedures   Medications  ketorolac (TORADOL) 30 MG/ML injection 30 mg (30 mg Intramuscular Given 11/09/16 1353)     ____________________________________________   INITIAL IMPRESSION / ASSESSMENT AND PLAN / ED COURSE  Pertinent labs & imaging results that were available during my care of the patient were reviewed by me and considered in my medical decision making (see chart for details).  Clinical Course     Patient's diagnosis is consistent with viral illness and toothache. Patient was given IM Toradol while in the emergency department and tolerated well with significant decrease in headache and tooth pain. Patient will be discharged home with prescriptions for ibuprofen to take as directed. Patient is to continue all chronic medications as previously prescribed by her primary care provider. Patient is to follow up with her primary care provider if symptoms persist past this treatment course. Patient is also advised to follow-up with her dentist in regards to tooth sensitivity. Patient is given ED precautions to return to the ED for any worsening or new symptoms.    ____________________________________________  FINAL CLINICAL IMPRESSION(S) / ED DIAGNOSES  Final diagnoses:  Viral illness  Toothache      NEW MEDICATIONS STARTED DURING THIS VISIT:  Discharge Medication List as of 11/09/2016  2:16 PM    START taking these medications   Details  ibuprofen (ADVIL,MOTRIN) 800 MG tablet Take 1 tablet (800 mg total) by mouth Miller 8 (eight) hours as needed (pain)., Starting Thu 11/09/2016, Print             Ernestene Kiel [place], PA-C 11/09/16 1521    Emily Filbert, MD 11/10/16 [phone]

## 2016-11-30 ENCOUNTER — Emergency Department
Admission: EM | Admit: 2016-11-30 | Discharge: 2016-11-30 | Disposition: A | Discharge status: Home / Self Care | Payer: Medicaid Other | Attending: Emergency Medicine | Admitting: Emergency Medicine

## 2016-11-30 ENCOUNTER — Encounter: Payer: Self-pay | Admitting: Emergency Medicine

## 2016-11-30 DIAGNOSIS — Z79899 Other long term (current) drug therapy: Secondary | ICD-10-CM | POA: Insufficient documentation

## 2016-11-30 DIAGNOSIS — R55 Syncope and collapse: Secondary | ICD-10-CM

## 2016-11-30 DIAGNOSIS — I10 Essential (primary) hypertension: Secondary | ICD-10-CM | POA: Insufficient documentation

## 2016-11-30 LAB — CBC
HCT: 37.6 % (ref 35.0–47.0)
Hemoglobin: 12.2 g/dL (ref 12.0–16.0)
MCH: 27 pg (ref 26.0–34.0)
MCHC: 32.5 g/dL (ref 32.0–36.0)
MCV: 83.2 fL (ref 80.0–100.0)
Platelets: 273 10*3/uL (ref 150–440)
RBC: 4.52 MIL/uL (ref 3.80–5.20)
RDW: 14.2 % (ref 11.5–14.5)
WBC: 7.7 10*3/uL (ref 3.6–11.0)

## 2016-11-30 LAB — GLUCOSE, CAPILLARY: Glucose-Capillary: 68 mg/dL (ref 65–99)

## 2016-11-30 LAB — POCT PREGNANCY, URINE: Preg Test, Ur: NEGATIVE

## 2016-11-30 LAB — TROPONIN I: Troponin I: <0.03 ng/mL (ref <0.03)

## 2016-11-30 LAB — BASIC METABOLIC PANEL
Anion gap: 6 (ref 5–15)
BUN: 10 mg/dL (ref 6–20)
CO2: 28 mmol/L (ref 22–32)
Calcium: 9.2 mg/dL (ref 8.9–10.3)
Chloride: 105 mmol/L (ref 101–111)
Creatinine, Ser: 0.65 mg/dL (ref 0.44–1.00)
GFR calc Af Amer: >60 mL/min (ref >60)
GFR calc non Af Amer: >60 mL/min (ref >60)
Glucose, Bld: 110 mg/dL — ABNORMAL HIGH (ref 65–99)
Potassium: 3.8 mmol/L (ref 3.5–5.1)
Sodium: 139 mmol/L (ref 135–145)

## 2016-11-30 LAB — URINALYSIS, COMPLETE (UACMP) WITH MICROSCOPIC
Bacteria, UA: NONE SEEN
Bilirubin Urine: NEGATIVE
Glucose, UA: NEGATIVE mg/dL
Hgb urine dipstick: NEGATIVE
Ketones, ur: NEGATIVE mg/dL
Nitrite: NEGATIVE
Protein, ur: NEGATIVE mg/dL
Specific Gravity, Urine: 1.016 (ref 1.005–1.030)
pH: 6 (ref 5.0–8.0)

## 2016-11-30 LAB — TSH: TSH: 1.123 u[IU]/mL (ref 0.350–4.500)

## 2016-11-30 MED ORDER — ACETAMINOPHEN 325 MG PO TABS
650.0000 mg | ORAL_TABLET | Freq: Once | ORAL | Status: AC
  Administered 2016-11-30: 650 mg via ORAL
  Filled 2016-11-30: qty 2

## 2016-11-30 MED ORDER — LORAZEPAM 2 MG/ML IJ SOLN
INTRAMUSCULAR | Status: AC
  Administered 2016-11-30: 1 mg via INTRAVENOUS
  Filled 2016-11-30: qty 1

## 2016-11-30 MED ORDER — SODIUM CHLORIDE 0.9 % IV BOLUS (SEPSIS)
1000.0000 mL | Freq: Once | INTRAVENOUS | Status: AC
  Administered 2016-11-30: 1000 mL via INTRAVENOUS

## 2016-11-30 MED ORDER — LORAZEPAM 2 MG/ML IJ SOLN
1.0000 mg | Freq: Once | INTRAMUSCULAR | Status: AC
  Administered 2016-11-30: 1 mg via INTRAVENOUS

## 2016-11-30 NOTE — ED Notes (Signed)
Patient given food and ginger ale

## 2016-11-30 NOTE — ED Notes (Signed)
Gave patient phone to speak with her mother

## 2016-11-30 NOTE — ED Notes (Signed)
Reviewed d/c instructions, follow-up care, OTC pain medications with patient. PT verbalized understanding.

## 2016-11-30 NOTE — ED Notes (Signed)
ED Provider at bedside. 

## 2016-11-30 NOTE — ED Triage Notes (Signed)
Pt reports syncope today, she is unsure how many times. Reports has had this happen before when HR high. C/o generalized pain. Pt reports having flu 3 weeks ago. Denies NVD. Reports has not been eating as much. Feels fatigued.

## 2016-11-30 NOTE — ED Provider Notes (Addendum)
[facility] Emergency Department Provider Note  ____________________________________________   I have reviewed the triage vital signs and the nursing notes.   HISTORY  Chief Complaint Loss of Consciousness    HPI Andrea Miller is a 31 y.o. female with a history of pseudoseizures, fibromyalgia, and persistent tachycardia, panic disorder, anxiety, and multiple other medical issues presents today complaining of feeling lightheaded. She states that today she passed out once. She states other times she "almost passed out". Heart rate was up for triage but we got her in the room and put her on the monitor it would go down to the high 90s. While in the room and immediately went to the 120s. Patient states that she is not having chest pain or shortness of breath, she has a slight headache which is the same headache that she always have. She denies any trauma. She has a history of chronic migraines. She denies any vomiting or diarrhea. Patient states she hasn't felt like eating today so she has had exactly 1 piece of bread the entire day. She states that she feels somewhat anxious but she has no SI or HI. She denies fever or chills or nausea or vomiting or diarrhea. She states her entire body hurts but not anywhere specific.  Patient has had multiple different visits to multiple different hospitals for multiple different similar complaints of this variety. She has had negative cardiac workups which are extensively performed according to patient and she has had a negative CT scan of the chest at this facility for similar complaints.  Patient wears a heart rate monitor on her wrist so she can watch her heart rate all the time, she states it almost never below 100 the last year, she follows up closely with cardiology for this.  Past Medical History:  Diagnosis Date  . Anxiety   . Bipolar disorder (HCC)   . Chest pain    and angina  . Depression   . Fibromyalgia   .  Hypertension   . 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 Obesity   . Seizures (HCC)    secondary to anxiety  . SVT (supraventricular tachycardia) (HCC)    with hx of syncope; managed by [facility]    There are no active problems to display for this patient.   Past Surgical History:  Procedure Laterality Date  . NO PAST SURGERIES    . TONSILLECTOMY AND ADENOIDECTOMY N/A 09/05/2016   Procedure: TONSILLECTOMY AND ADENOIDECTOMY;  Surgeon: Linus Salmons, MD;  Location: [facility] ORS;  Service: ENT;  Laterality: N/A;    Prior to Admission medications   Medication Sig Start Date End Date Taking? Authorizing Provider  albuterol (PROVENTIL) (2.5 MG/3ML) 0.083% nebulizer solution Take 2.5 mg by nebulization every 4 (four) hours as needed for wheezing or shortness of breath.    Historical Provider, MD  amphetamine-dextroamphetamine (ADDERALL XR) 15 MG 24 hr capsule Take 15 mg by mouth every morning.    Historical Provider, MD  beclomethasone (QVAR) 40 MCG/ACT inhaler Inhale 2 puffs into the lungs 2 (two) times daily.    Historical Provider, MD  benzonatate (TESSALON PERLES) 100 MG capsule Take 1 capsule (100 mg total) by mouth 3 (three) times daily as needed for cough. Patient not taking: Reported on 09/05/2016 08/10/16   Evangeline Dakin, PA-C  Carbamazepine (EQUETRO) 300 MG CP12 Take by mouth every morning.    Historical Provider, MD  Cariprazine HCl (VRAYLAR) 3 MG CAPS Take  by mouth at bedtime.    Historical Provider, MD  celecoxib (CELEBREX) 100 MG capsule Take 100 mg by mouth 2 (two) times daily.    Historical Provider, MD  clonazePAM (KLONOPIN) 2 MG tablet Take 2 mg by mouth at bedtime as needed for anxiety.    Historical Provider, MD  Dexlansoprazole 30 MG capsule Take 30 mg by mouth every morning.    Historical Provider, MD  diltiazem (CARDIZEM CD) 240 MG 24 hr capsule Take 240 mg by mouth every morning.    Historical Provider, MD  furosemide (LASIX) 20 MG  tablet Take 20 mg by mouth every morning.    Historical Provider, MD  gabapentin (NEURONTIN) 300 MG capsule Take 300 mg by mouth 3 (three) times daily.    Historical Provider, MD  ibuprofen (ADVIL,MOTRIN) 800 MG tablet Take 1 tablet (800 mg total) by mouth every 8 (eight) hours as needed (pain). 11/09/16   Jami L Hagler, PA-C  lidocaine (XYLOCAINE) 2 % solution Use as directed 5 mLs in the mouth or throat every 6 (six) hours as needed for mouth pain. Mixed with 5 ML's all Bromfed-DM for swish and swallow 08/30/16   Joni Reining, PA-C  metoprolol (LOPRESSOR) 100 MG tablet Take 200 mg by mouth every morning.  09/21/14   Historical Provider, MD  Multiple Vitamins-Minerals (MULTIVITAMIN WITH MINERALS) tablet Take 1 tablet by mouth daily.    Historical Provider, MD  orlistat (XENICAL) 120 MG capsule Take 120 mg by mouth 3 (three) times daily with meals.    Historical Provider, MD  oxyCODONE-acetaminophen (ROXICET) 5-325 MG tablet Take 1-2 tablets by mouth every 4 (four) hours as needed for severe pain. 09/05/16   Linus Salmons, MD  potassium chloride (K-DUR) 10 MEQ tablet Take 10 mEq by mouth every morning.    Historical Provider, MD  tiZANidine (ZANAFLEX) 4 MG capsule Take 4 mg by mouth 3 (three) times daily as needed for muscle spasms.    Historical Provider, MD    Allergies Cortizone-10 [hydrocortisone]; Garlic; Penicillins; and Corticosteroids  Family History  Problem Relation Age of Onset  . Diabetes Mother   . Hypertension Mother   . Asthma Mother   . Diabetes Father   . Hypertension Father     Social History Social History  Substance Use Topics  . Smoking status: Never Smoker  . Smokeless tobacco: Never Used  . Alcohol use No    Review of Systems Constitutional: No fever/chills Eyes: No visual changes. ENT: No sore throat. No stiff neck no neck pain Cardiovascular: Denies chest pain. Respiratory: Denies shortness of breath. Gastrointestinal:   no vomiting.  No diarrhea.  No  constipation. Genitourinary: Negative for dysuria. Musculoskeletal: Negative lower extremity swelling Skin: Negative for rash. Neurological: Negative for severe headaches, focal weakness or numbness. 10-point ROS otherwise negative.  ____________________________________________   PHYSICAL EXAM:  VITAL SIGNS: ED Triage Vitals [11/30/16 1724]  Enc Vitals Group     BP (!) 144/91     Pulse Rate (!) 120     Resp 20     Temp 98.7 F (37.1 C)     Temp Source Oral     SpO2 99 %     Weight (!) 340 lb (154.2 kg)     Height 5\' 10"  (1.778 m)     Head Circumference      Peak Flow      Pain Score 10     Pain Loc      Pain Edu?  Excl. in GC?     Constitutional: Alert and oriented. Well appearing and in no acute distress.Mildly anxious Eyes: Conjunctivae are normal. PERRL. EOMI. Head: Atraumatic. Nose: No congestion/rhinnorhea. Mouth/Throat: Mucous membranes are moist.  Oropharynx non-erythematous. Neck: No stridor.   Nontender with no meningismus Cardiovascular:Mild tachycardia seen heart rate was 100 when I went to the door of the room and immediately went up to 120 when I entered n, regular rhythm. Grossly normal heart sounds.  Good peripheral circulation. Respiratory: Normal respiratory effort.  No retractions. Lungs CTAB. Abdominal: Soft and nontender. No distention. No guarding no rebound Back:  There is no focal tenderness or step off.  there is no midline tenderness there are no lesions noted. there is no CVA tenderness Musculoskeletal: No lower extremity tenderness, no upper extremity tenderness. No joint effusions, no DVT signs strong distal pulses no edema Neurologic:  Normal speech and language. No gross focal neurologic deficits are appreciated.  Skin:  Skin is warm, dry and intact. No rash noted. Psychiatric: Mood and affect are anxious. Speech and behavior are normal.  ____________________________________________   LABS (all labs ordered are listed, but only  abnormal results are displayed)  Labs Reviewed  BASIC METABOLIC PANEL - Abnormal; Notable for the following:       Result Value   Glucose, Bld 110 (*)    All other components within normal limits  URINALYSIS, COMPLETE (UACMP) WITH MICROSCOPIC - Abnormal; Notable for the following:    Color, Urine YELLOW (*)    APPearance CLEAR (*)    Leukocytes, UA SMALL (*)    Squamous Epithelial / LPF 0-5 (*)    All other components within normal limits  CBC  TROPONIN I  GLUCOSE, CAPILLARY  CBG MONITORING, ED  POCT PREGNANCY, URINE  POC URINE PREG, ED   ____________________________________________  EKG  I personally interpreted any EKGs ordered by me or triage  ____________________________________________  RADIOLOGY  I reviewed any imaging ordered by me or triage that were performed during my shift and, if possible, patient and/or family made aware of any abnormal findings. ____________________________________________   PROCEDURES  Procedure(s) performed: None  Procedures  Critical Care performed: None  ____________________________________________   INITIAL IMPRESSION / ASSESSMENT AND PLAN / ED COURSE  Pertinent labs & imaging results that were available during my care of the patient were reviewed by me and considered in my medical decision making (see chart for details).  Patient with no evidence of acute pathology today has had extensive workup for her persistent tachycardia and her diagnosis is "tachycardia". Patient is on metoprolol and diltiazem for this. She states she did take her medication today. No evidence of acute myocardial infarction. States her heart rate is been up for the last 2 weeks. She states she has had her thyroid looked at. She does not have any signs or symptoms of a PE except for this known tachycardia for which she has already had a negative CT scan of her chest over a urinary half ago for the same complaints. Patient is in no acute distress but she is  somewhat anxious. We'll keep her on the monitor will give her IV fluids and she hasn't had anything by mouth today for some reason, we'll give her Ativan. She understands if she receives Ativan she actually cannot drive home. She states her mother will come pick her up.   ----------------------------------------- 9:35 PM on 11/30/2016 -----------------------------------------  At this time, heart rate 102, patient very comfortable in the room. She is specific. Food.  She is requested multiple different times to be fed. Of course we will feed her. I think this certainly could've contributed to her syncopal episode of such it was given the fact that she has not had any food today. Patient was watching her phone the entire time with no evidence of distress. She states her whole body hurts from falling over but is no obvious injury. Asked her if there is any part of her body I could x-ray and she states now it just hurts" detect any obvious fracture, patient is morbidly obese which limits exam. No evidence of closed head injury.----------------------------------------- 10:47 PM on 11/30/2016 -----------------------------------------  On discharge patient was angry and upset because she was not given narcotic pain medication. I tried to explain to her that we do not see any pathology requiring narcotic pain medication and, unfortunately, at this time, there is significant limitations how much narcotics we can distribute for any reason not to mention vague pains. Patient states that she will come back tomorrow. Did encourage her to return if she felt worse. Ambulated with no difficulty declines any imaging.    Clinical Course    ____________________________________________   FINAL CLINICAL IMPRESSION(S) / ED DIAGNOSES  Final diagnoses:  None      This chart was dictated using voice recognition software.  Despite best efforts to proofread,  errors can occur which can change meaning.      Jeanmarie Plant, MD 11/30/16 2104    Jeanmarie Plant, MD 11/30/16 4401    Jeanmarie Plant, MD 11/30/16 0272    Jeanmarie Plant, MD 11/30/16 [phone]

## 2016-12-12 DIAGNOSIS — R55 Syncope and collapse: Secondary | ICD-10-CM | POA: Insufficient documentation

## 2016-12-22 ENCOUNTER — Emergency Department: Payer: Self-pay

## 2016-12-22 ENCOUNTER — Emergency Department
Admission: EM | Admit: 2016-12-22 | Discharge: 2016-12-22 | Disposition: A | Discharge status: Home / Self Care | Payer: Self-pay | Attending: Student in an Organized Health Care Education/Training Program | Admitting: Student in an Organized Health Care Education/Training Program

## 2016-12-22 DIAGNOSIS — R0981 Nasal congestion: Secondary | ICD-10-CM

## 2016-12-22 DIAGNOSIS — I10 Essential (primary) hypertension: Secondary | ICD-10-CM | POA: Insufficient documentation

## 2016-12-22 DIAGNOSIS — Z79899 Other long term (current) drug therapy: Secondary | ICD-10-CM | POA: Insufficient documentation

## 2016-12-22 DIAGNOSIS — J209 Acute bronchitis, unspecified: Secondary | ICD-10-CM | POA: Insufficient documentation

## 2016-12-22 DIAGNOSIS — Z791 Long term (current) use of non-steroidal anti-inflammatories (NSAID): Secondary | ICD-10-CM | POA: Insufficient documentation

## 2016-12-22 MED ORDER — PSEUDOEPH-BROMPHEN-DM 30-2-10 MG/5ML PO SYRP: 5.0000 mL | ORAL_SOLUTION | Freq: Four times a day (QID) | ORAL | 0 refills | Status: DC | PRN

## 2016-12-22 MED ORDER — IBUPROFEN 600 MG PO TABS: 600.0000 mg | ORAL_TABLET | Freq: Four times a day (QID) | ORAL | 0 refills | Status: DC | PRN

## 2016-12-22 NOTE — ED Provider Notes (Signed)
[facility] Emergency Department Provider Note   ____________________________________________   First MD Initiated Contact with Patient 12/22/16 1154     (approximate)  I have reviewed the triage vital signs and the nursing notes.   HISTORY  Chief Complaint Nausea and Headache    HPI Andrea Miller is a 31 y.o. female patient complaining with frontal headache, facial pressure, nausea and vomiting, and fever. Patient states she was diagnosed with flu last week by Dr. Patient states she stopped feeling better but complaint worsens last night. Patient states she's having chest wall pain from the  continual cough. No Diarrhea. No palliative measures taken for this complaint.Patient rates the pain as a 10 over 10. Patient described the pain as "acute".   Past Medical History:  Diagnosis Date  . Anxiety   . Bipolar disorder (HCC)   . Chest pain    and angina  . Depression   . Fibromyalgia   . Hypertension   . 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 Obesity   . Seizures (HCC)    secondary to anxiety  . SVT (supraventricular tachycardia) (HCC)    with hx of syncope; managed by [facility]    There are no active problems to display for this patient.   Past Surgical History:  Procedure Laterality Date  . NO PAST SURGERIES    . TONSILLECTOMY AND ADENOIDECTOMY N/A 09/05/2016   Procedure: TONSILLECTOMY AND ADENOIDECTOMY;  Surgeon: Linus Salmons, MD;  Location: [facility] ORS;  Service: ENT;  Laterality: N/A;    Prior to Admission medications   Medication Sig Start Date End Date Taking? Authorizing Provider  albuterol (PROVENTIL) (2.5 MG/3ML) 0.083% nebulizer solution Take 2.5 mg by nebulization every 4 (four) hours as needed for wheezing or shortness of breath.    Historical Provider, MD  amphetamine-dextroamphetamine (ADDERALL XR) 15 MG 24 hr capsule Take 15 mg by mouth every morning.    Historical Provider, MD    beclomethasone (QVAR) 40 MCG/ACT inhaler Inhale 2 puffs into the lungs 2 (two) times daily.    Historical Provider, MD  benzonatate (TESSALON PERLES) 100 MG capsule Take 1 capsule (100 mg total) by mouth 3 (three) times daily as needed for cough. Patient not taking: Reported on 09/05/2016 08/10/16   Evangeline Dakin, PA-C  brompheniramine-pseudoephedrine-DM 30-2-10 MG/5ML syrup Take 5 mLs by mouth 4 (four) times daily as needed. 12/22/16   Joni Reining, PA-C  Carbamazepine (EQUETRO) 300 MG CP12 Take by mouth every morning.    Historical Provider, MD  Cariprazine HCl (VRAYLAR) 3 MG CAPS Take by mouth at bedtime.    Historical Provider, MD  celecoxib (CELEBREX) 100 MG capsule Take 100 mg by mouth 2 (two) times daily.    Historical Provider, MD  clonazePAM (KLONOPIN) 2 MG tablet Take 2 mg by mouth at bedtime as needed for anxiety.    Historical Provider, MD  Dexlansoprazole 30 MG capsule Take 30 mg by mouth every morning.    Historical Provider, MD  diltiazem (CARDIZEM CD) 240 MG 24 hr capsule Take 240 mg by mouth every morning.    Historical Provider, MD  furosemide (LASIX) 20 MG tablet Take 20 mg by mouth every morning.    Historical Provider, MD  gabapentin (NEURONTIN) 300 MG capsule Take 300 mg by mouth 3 (three) times daily.    Historical Provider, MD  ibuprofen (ADVIL,MOTRIN) 600 MG tablet Take 1 tablet (600 mg total) by mouth every 6 (six) hours  as needed. 12/22/16   Joni Reining, PA-C  ibuprofen (ADVIL,MOTRIN) 800 MG tablet Take 1 tablet (800 mg total) by mouth every 8 (eight) hours as needed (pain). 11/09/16   Jami L Hagler, PA-C  lidocaine (XYLOCAINE) 2 % solution Use as directed 5 mLs in the mouth or throat every 6 (six) hours as needed for mouth pain. Mixed with 5 ML's all Bromfed-DM for swish and swallow 08/30/16   Joni Reining, PA-C  metoprolol (LOPRESSOR) 100 MG tablet Take 200 mg by mouth every morning.  09/21/14   Historical Provider, MD  Multiple Vitamins-Minerals (MULTIVITAMIN WITH  MINERALS) tablet Take 1 tablet by mouth daily.    Historical Provider, MD  orlistat (XENICAL) 120 MG capsule Take 120 mg by mouth 3 (three) times daily with meals.    Historical Provider, MD  oxyCODONE-acetaminophen (ROXICET) 5-325 MG tablet Take 1-2 tablets by mouth every 4 (four) hours as needed for severe pain. 09/05/16   Linus Salmons, MD  potassium chloride (K-DUR) 10 MEQ tablet Take 10 mEq by mouth every morning.    Historical Provider, MD  tiZANidine (ZANAFLEX) 4 MG capsule Take 4 mg by mouth 3 (three) times daily as needed for muscle spasms.    Historical Provider, MD    Allergies Cortizone-10 [hydrocortisone]; Garlic; Penicillins; and Corticosteroids  Family History  Problem Relation Age of Onset  . Diabetes Mother   . Hypertension Mother   . Asthma Mother   . Diabetes Father   . Hypertension Father     Social History Social History  Substance Use Topics  . Smoking status: Never Smoker  . Smokeless tobacco: Never Used  . Alcohol use No    Review of Systems Constitutional: Fever/chills and body aches.  Eyes: No visual changes. ENT: No sore throat. Nasal congestion and facial pain. Cardiovascular: Denies chest pain. Respiratory: Denies shortness of breath. States productive greenish sputum Gastrointestinal: No abdominal pain. Nausea and vomiting..  No diarrhea.  No constipation. Genitourinary: Negative for dysuria. Musculoskeletal: Negative for back pain. Skin: Negative for rash. Neurological: Negative for headaches, focal weakness or numbness. Psychiatric:Anxiety and depression. Endocrine:Hypertension Allergic/Immunilogical: See medication list   ____________________________________________   PHYSICAL EXAM:  VITAL SIGNS: ED Triage Vitals  Enc Vitals Group     BP 12/22/16 1120 139/86     Pulse Rate 12/22/16 1120 (!) 108     Resp 12/22/16 1120 18     Temp 12/22/16 1120 98.9 F (37.2 C)     Temp Source 12/22/16 1120 Oral     SpO2 12/22/16 1120 97 %      Weight 12/22/16 1121 (!) 320 lb (145.2 kg)     Height 12/22/16 1121 5\' 10"  (1.778 m)     Head Circumference --      Peak Flow --      Pain Score 12/22/16 1121 10     Pain Loc --      Pain Edu? --      Excl. in GC? --     Constitutional: Alert and oriented. Well appearing and in no acute distress. Eyes: Conjunctivae are normal. PERRL. EOMI. Head: Atraumatic. Nose: Edematous nasal turbinates with thick rhinorrhea Mouth/Throat: Mucous membranes are moist.  Oropharynx non-erythematous. Neck: No stridor.  No cervical spine tenderness to palpation. Hematological/Lymphatic/Immunilogical: No cervical lymphadenopathy. Cardiovascular: Normal rate, regular rhythm. Grossly normal heart sounds.  Good peripheral circulation. Respiratory: Normal respiratory effort.  No retractions. Lungs with Rhonchi breath sounds. Dr. cough Gastrointestinal: Soft and nontender. No distention. No abdominal bruits. No  CVA tenderness. Musculoskeletal: No lower extremity tenderness nor edema.  No joint effusions. Neurologic:  Normal speech and language. No gross focal neurologic deficits are appreciated. No gait instability. Skin:  Skin is warm, dry and intact. No rash noted. Psychiatric: Mood and affect are normal. Speech and behavior are normal.  ____________________________________________   LABS (all labs ordered are listed, but only abnormal results are displayed)  Labs Reviewed - No data to display ____________________________________________  EKG   ____________________________________________  RADIOLOGY  No acute findings on chest x-ray ____________________________________________   PROCEDURES  Procedure(s) performed: None  Procedures  Critical Care performed: No  ____________________________________________   INITIAL IMPRESSION / ASSESSMENT AND PLAN / ED COURSE  Pertinent labs & imaging results that were available during my care of the patient were reviewed by me and considered in my  medical decision making (see chart for details).  Sinus congestion and bronchitis. Patient given discharge Instructions. Patient given prescription for [facility] felt DM and ibuprofen. Patient advised to follow-up with family doctor this condition persists.      ____________________________________________   FINAL CLINICAL IMPRESSION(S) / ED DIAGNOSES  Final diagnoses:  Acute bronchitis, unspecified organism  Sinus congestion      NEW MEDICATIONS STARTED DURING THIS VISIT:  New Prescriptions   BROMPHENIRAMINE-PSEUDOEPHEDRINE-DM 30-2-10 MG/5ML SYRUP    Take 5 mLs by mouth 4 (four) times daily as needed.   IBUPROFEN (ADVIL,MOTRIN) 600 MG TABLET    Take 1 tablet (600 mg total) by mouth every 6 (six) hours as needed.     Note:  This document was prepared using Dragon voice recognition software and may include unintentional dictation errors.    Joni Reining, PA-C 12/22/16 1327    Willy Eddy, MD 12/22/16 [phone]

## 2016-12-22 NOTE — ED Triage Notes (Signed)
Pt presents with headache, facial pressure, N&V, fever. Pt states last week she went to doctor and had flu. States fever last week. States was feeling better but now worse. Pt also states chest pain from coughing and sneezing. Runny nose. Denies diarrhea at this time. Pt is alert and oriented.

## 2017-01-04 ENCOUNTER — Emergency Department: Payer: Medicaid Other

## 2017-01-04 ENCOUNTER — Emergency Department
Admission: EM | Admit: 2017-01-04 | Discharge: 2017-01-04 | Disposition: A | Discharge status: Home / Self Care | Payer: Medicaid Other | Attending: Emergency Medicine | Admitting: Emergency Medicine

## 2017-01-04 DIAGNOSIS — B9789 Other viral agents as the cause of diseases classified elsewhere: Secondary | ICD-10-CM

## 2017-01-04 DIAGNOSIS — I10 Essential (primary) hypertension: Secondary | ICD-10-CM | POA: Insufficient documentation

## 2017-01-04 DIAGNOSIS — Z791 Long term (current) use of non-steroidal anti-inflammatories (NSAID): Secondary | ICD-10-CM | POA: Insufficient documentation

## 2017-01-04 DIAGNOSIS — G43909 Migraine, unspecified, not intractable, without status migrainosus: Secondary | ICD-10-CM | POA: Insufficient documentation

## 2017-01-04 DIAGNOSIS — J069 Acute upper respiratory infection, unspecified: Secondary | ICD-10-CM | POA: Insufficient documentation

## 2017-01-04 DIAGNOSIS — Z79899 Other long term (current) drug therapy: Secondary | ICD-10-CM | POA: Insufficient documentation

## 2017-01-04 LAB — COMPREHENSIVE METABOLIC PANEL
ALT: 12 U/L — ABNORMAL LOW (ref 14–54)
AST: 21 U/L (ref 15–41)
Albumin: 4 g/dL (ref 3.5–5.0)
Alkaline Phosphatase: 83 U/L (ref 38–126)
Anion gap: 7 (ref 5–15)
BUN: 10 mg/dL (ref 6–20)
CO2: 30 mmol/L (ref 22–32)
Calcium: 9.5 mg/dL (ref 8.9–10.3)
Chloride: 105 mmol/L (ref 101–111)
Creatinine, Ser: 0.77 mg/dL (ref 0.44–1.00)
GFR calc Af Amer: >60 mL/min (ref >60)
GFR calc non Af Amer: >60 mL/min (ref >60)
Glucose, Bld: 107 mg/dL — ABNORMAL HIGH (ref 65–99)
Potassium: 3.8 mmol/L (ref 3.5–5.1)
Sodium: 142 mmol/L (ref 135–145)
Total Bilirubin: 0.4 mg/dL (ref 0.3–1.2)
Total Protein: 8.2 g/dL — ABNORMAL HIGH (ref 6.5–8.1)

## 2017-01-04 LAB — URINALYSIS, ROUTINE W REFLEX MICROSCOPIC
Bilirubin Urine: NEGATIVE
Glucose, UA: NEGATIVE mg/dL
Hgb urine dipstick: NEGATIVE
Ketones, ur: NEGATIVE mg/dL
Leukocytes, UA: NEGATIVE
Nitrite: NEGATIVE
Protein, ur: NEGATIVE mg/dL
Specific Gravity, Urine: 1.018 (ref 1.005–1.030)
pH: 7 (ref 5.0–8.0)

## 2017-01-04 LAB — CBC
HCT: 37.4 % (ref 35.0–47.0)
Hemoglobin: 12.3 g/dL (ref 12.0–16.0)
MCH: 27.2 pg (ref 26.0–34.0)
MCHC: 32.9 g/dL (ref 32.0–36.0)
MCV: 82.7 fL (ref 80.0–100.0)
Platelets: 317 10*3/uL (ref 150–440)
RBC: 4.52 MIL/uL (ref 3.80–5.20)
RDW: 14.1 % (ref 11.5–14.5)
WBC: 6.8 10*3/uL (ref 3.6–11.0)

## 2017-01-04 LAB — INFLUENZA PANEL BY PCR (TYPE A & B)
Influenza A By PCR: NEGATIVE
Influenza B By PCR: NEGATIVE

## 2017-01-04 LAB — TROPONIN I: Troponin I: <0.03 ng/mL (ref <0.03)

## 2017-01-04 MED ORDER — KETOROLAC TROMETHAMINE 30 MG/ML IJ SOLN
30.0000 mg | Freq: Once | INTRAMUSCULAR | Status: AC
  Administered 2017-01-04: 30 mg via INTRAVENOUS
  Filled 2017-01-04: qty 1

## 2017-01-04 MED ORDER — SODIUM CHLORIDE 0.9 % IV BOLUS (SEPSIS)
1000.0000 mL | Freq: Once | INTRAVENOUS | Status: AC
  Administered 2017-01-04: 1000 mL via INTRAVENOUS

## 2017-01-04 MED ORDER — METOCLOPRAMIDE HCL 5 MG/ML IJ SOLN
10.0000 mg | Freq: Once | INTRAMUSCULAR | Status: AC
  Administered 2017-01-04: 10 mg via INTRAVENOUS
  Filled 2017-01-04: qty 2

## 2017-01-04 MED ORDER — IPRATROPIUM-ALBUTEROL 0.5-2.5 (3) MG/3ML IN SOLN
3.0000 mL | Freq: Once | RESPIRATORY_TRACT | Status: AC
  Administered 2017-01-04: 3 mL via RESPIRATORY_TRACT
  Filled 2017-01-04: qty 3

## 2017-01-04 MED ORDER — DIPHENHYDRAMINE HCL 50 MG/ML IJ SOLN
25.0000 mg | Freq: Once | INTRAMUSCULAR | Status: AC
  Administered 2017-01-04: 25 mg via INTRAVENOUS
  Filled 2017-01-04: qty 1

## 2017-01-04 NOTE — ED Provider Notes (Signed)
[facility] Emergency Department Provider Note  ____________________________________________  Time seen: Approximately 6:04 PM  I have reviewed the triage vital signs and the nursing notes.   HISTORY  Chief Complaint Migraine and Chest Pain   HPI Andrea Miller is a 31 y.o. female history of fibromyalgia, obesity, asthma,  SVT who presents for evaluation of multiple medical complaints. Patient is complaining of a migraine that is frontal, severe, since yesterday associated with nausea and photophobia. She has a history of migraines and this one feels exactly like her prior migraines. She tried her triptan at home with no relief. She denies fever or neck stiffness, changes in her vision. Patient is also complaining of chest pain and shortness of breath that she has had constantly for 2 weeks. She reports that her chest pains located in the center of her chest, sharp, pleuritic, moderate, associated with shortness of breath at rest and with exertion. She still coughing and bringing up yellow phlegm. She was seen at [facility] 2 weeks ago and was diagnosed with pneumonia and finished a Z-Pak last week. She reports that she felt better for a few days and started to feel worse again. She also complaining of drainage from her bilateral eyes and body aches.No fever but has had chills.  Past Medical History:  Diagnosis Date  . Anxiety   . Bipolar disorder (HCC)   . Chest pain    and angina  . Depression   . Fibromyalgia   . Hypertension   . 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 Obesity   . Seizures (HCC)    secondary to anxiety  . SVT (supraventricular tachycardia) (HCC)    with hx of syncope; managed by [facility]    There are no active problems to display for this patient.   Past Surgical History:  Procedure Laterality Date  . NO PAST SURGERIES    . TONSILLECTOMY AND ADENOIDECTOMY N/A 09/05/2016   Procedure: TONSILLECTOMY AND  ADENOIDECTOMY;  Surgeon: Linus Salmons, MD;  Location: [facility] ORS;  Service: ENT;  Laterality: N/A;    Prior to Admission medications   Medication Sig Start Date End Date Taking? Authorizing Provider  albuterol (PROVENTIL) (2.5 MG/3ML) 0.083% nebulizer solution Take 2.5 mg by nebulization every 4 (four) hours as needed for wheezing or shortness of breath.    Historical Provider, MD  amphetamine-dextroamphetamine (ADDERALL XR) 15 MG 24 hr capsule Take 15 mg by mouth every morning.    Historical Provider, MD  beclomethasone (QVAR) 40 MCG/ACT inhaler Inhale 2 puffs into the lungs 2 (two) times daily.    Historical Provider, MD  benzonatate (TESSALON PERLES) 100 MG capsule Take 1 capsule (100 mg total) by mouth 3 (three) times daily as needed for cough. Patient not taking: Reported on 09/05/2016 08/10/16   Evangeline Dakin, PA-C  brompheniramine-pseudoephedrine-DM 30-2-10 MG/5ML syrup Take 5 mLs by mouth 4 (four) times daily as needed. 12/22/16   Joni Reining, PA-C  Carbamazepine (EQUETRO) 300 MG CP12 Take by mouth every morning.    Historical Provider, MD  Cariprazine HCl (VRAYLAR) 3 MG CAPS Take by mouth at bedtime.    Historical Provider, MD  celecoxib (CELEBREX) 100 MG capsule Take 100 mg by mouth 2 (two) times daily.    Historical Provider, MD  clonazePAM (KLONOPIN) 2 MG tablet Take 2 mg by mouth at bedtime as needed for anxiety.    Historical Provider, MD  Dexlansoprazole 30 MG capsule Take 30 mg  by mouth every morning.    Historical Provider, MD  diltiazem (CARDIZEM CD) 240 MG 24 hr capsule Take 240 mg by mouth every morning.    Historical Provider, MD  furosemide (LASIX) 20 MG tablet Take 20 mg by mouth every morning.    Historical Provider, MD  gabapentin (NEURONTIN) 300 MG capsule Take 300 mg by mouth 3 (three) times daily.    Historical Provider, MD  ibuprofen (ADVIL,MOTRIN) 600 MG tablet Take 1 tablet (600 mg total) by mouth every 6 (six) hours as needed. 12/22/16   Joni Reining, PA-C    ibuprofen (ADVIL,MOTRIN) 800 MG tablet Take 1 tablet (800 mg total) by mouth every 8 (eight) hours as needed (pain). 11/09/16   Jami L Hagler, PA-C  lidocaine (XYLOCAINE) 2 % solution Use as directed 5 mLs in the mouth or throat every 6 (six) hours as needed for mouth pain. Mixed with 5 ML's all Bromfed-DM for swish and swallow 08/30/16   Joni Reining, PA-C  metoprolol (LOPRESSOR) 100 MG tablet Take 200 mg by mouth every morning.  09/21/14   Historical Provider, MD  Multiple Vitamins-Minerals (MULTIVITAMIN WITH MINERALS) tablet Take 1 tablet by mouth daily.    Historical Provider, MD  orlistat (XENICAL) 120 MG capsule Take 120 mg by mouth 3 (three) times daily with meals.    Historical Provider, MD  oxyCODONE-acetaminophen (ROXICET) 5-325 MG tablet Take 1-2 tablets by mouth every 4 (four) hours as needed for severe pain. 09/05/16   Linus Salmons, MD  potassium chloride (K-DUR) 10 MEQ tablet Take 10 mEq by mouth every morning.    Historical Provider, MD  tiZANidine (ZANAFLEX) 4 MG capsule Take 4 mg by mouth 3 (three) times daily as needed for muscle spasms.    Historical Provider, MD    Allergies Cortizone-10 [hydrocortisone]; Garlic; Penicillins; and Corticosteroids  Family History  Problem Relation Age of Onset  . Diabetes Mother   . Hypertension Mother   . Asthma Mother   . Diabetes Father   . Hypertension Father     Social History Social History  Substance Use Topics  . Smoking status: Never Smoker  . Smokeless tobacco: Never Used  . Alcohol use No    Review of Systems  Constitutional: Negative for fever. + chills, body aches Eyes: Negative for visual changes. ENT: Negative for sore throat. Neck: No neck pain  Cardiovascular: + chest pain. Respiratory: + shortness of breath, cough Gastrointestinal: Negative for abdominal pain, vomiting or diarrhea. Genitourinary: Negative for dysuria. Musculoskeletal: Negative for back pain. Skin: Negative for rash. Neurological:  Negative for weakness or numbness. + HA Psych: No SI or HI  ____________________________________________   PHYSICAL EXAM:  VITAL SIGNS: ED Triage Vitals  Enc Vitals Group     BP 01/04/17 1546 (!) 148/125     Pulse Rate 01/04/17 1546 (!) 103     Resp 01/04/17 1546 20     Temp 01/04/17 1546 98.3 F (36.8 C)     Temp Source 01/04/17 1546 Oral     SpO2 01/04/17 1546 100 %     Weight 01/04/17 1546 (!) 360 lb (163.3 kg)     Height 01/04/17 1546 5\' 10"  (1.778 m)     Head Circumference --      Peak Flow --      Pain Score 01/04/17 1547 10     Pain Loc --      Pain Edu? --      Excl. in GC? --  Constitutional: Alert and oriented. Well appearing and in no apparent distress. HEENT:      Head: Normocephalic and atraumatic.         Eyes: Conjunctivae are injected bilaterally with clear discharge. Sclera is non-icteric. EOMI. PERRL      Mouth/Throat: Mucous membranes are moist.       Neck: Supple with no signs of meningismus. Cardiovascular: Tachycardic with regular rhythm. No murmurs, gallops, or rubs. 2+ symmetrical distal pulses are present in all extremities. No JVD. Respiratory: Normal respiratory effort. Decrease air movement bilaterally. Lungs are clear to auscultation bilaterally. No wheezes, crackles, or rhonchi.  Gastrointestinal: Soft, non tender, and non distended with positive bowel sounds. No rebound or guarding. Musculoskeletal: Nontender with normal range of motion in all extremities. No edema, cyanosis, or erythema of extremities. Neurologic: Normal speech and language. A & O x3, PERRL, no nystagmus, CN II-XII intact, motor testing reveals good tone and bulk throughout. There is no evidence of pronator drift or dysmetria. Muscle strength is 5/5 throughout. Deep tendon reflexes are 2+ throughout with downgoing toes. Sensory examination is intact. Gait is normal. Skin: Skin is warm, dry and intact. No rash noted. Psychiatric: Mood and affect are normal. Speech and behavior  are normal.  ____________________________________________   LABS (all labs ordered are listed, but only abnormal results are displayed)  Labs Reviewed  COMPREHENSIVE METABOLIC PANEL - Abnormal; Notable for the following:       Result Value   Glucose, Bld 107 (*)    Total Protein 8.2 (*)    ALT 12 (*)    All other components within normal limits  URINALYSIS, ROUTINE W REFLEX MICROSCOPIC - Abnormal; Notable for the following:    Color, Urine YELLOW (*)    APPearance CLOUDY (*)    All other components within normal limits  CBC  TROPONIN I  INFLUENZA PANEL BY PCR (TYPE A & B)   ____________________________________________  EKG  ED ECG REPORT I, Nita Sickle, the attending physician, personally viewed and interpreted this ECG.  Sinus tachycardia, rate of 102, normal intervals, normal axis, no ST elevations or depressions. Unchanged from prior ____________________________________________  RADIOLOGY  CXR: Negative ____________________________________________   PROCEDURES  Procedure(s) performed: None Procedures Critical Care performed:  None ____________________________________________   INITIAL IMPRESSION / ASSESSMENT AND PLAN / ED COURSE  31 y.o. female history of fibromyalgia, obesity, asthma,  SVT who presents for evaluation of multiple medical complaints.   # atypical CP and SOB + flulike symptoms: Patient has decreased air movement bilaterally but normal work of breathing, normal sats. We'll give DuoNeb treatments. Chest x-ray with no evidence of pneumonia. We'll check patient for the flu. EKG with no ischemic changes. Troponin is negative and since being has been going on for 2 weeks I don't feel the patient needs a second troponin.  # migraine HA: Identical to prior migraine headaches, neurologically intact. We'll treat with IV Toradol, fluids, Benadryl, and Reglan.  Clinical Course as of Jan 05 2000  Thu Jan 04, 2017  1959 Patient reports that she feels  markedly improved. No longer having chest pain or shortness of breath. Migraine has resolved. Flu is negative, chest x-ray with no evidence of pneumonia. Blood work no acute findings. We'll discharge home with supportive care and follow-up with primary care doctor.  [CV]    Clinical Course User Index [CV] Nita Sickle, MD    Pertinent labs & imaging results that were available during my care of the patient were reviewed by me and  considered in my medical decision making (see chart for details).    ____________________________________________   FINAL CLINICAL IMPRESSION(S) / ED DIAGNOSES  Final diagnoses:  Viral URI with cough  Migraine without status migrainosus, not intractable, unspecified migraine type      NEW MEDICATIONS STARTED DURING THIS VISIT:  New Prescriptions   No medications on file     Note:  This document was prepared using Dragon voice recognition software and may include unintentional dictation errors.    Nita Sickle, MD 01/04/17 2001

## 2017-01-04 NOTE — Discharge Instructions (Signed)

## 2017-01-04 NOTE — ED Triage Notes (Signed)
Pt states chronic migraines. Migraine located R and L temporal. Also reports CP in middle, with some SOB. PT states symptoms started today. Also c/o facial pressure. Denies congestion or nasal drainage. Alert and oriented, ambulatory. Pt states she was dx with pneumonia x 2 weeks at [facility]. Finished antibiotics. States hurts when she takes a deep breath. Coughing still. States fell 2 days ago and c/o all over body pain.

## 2017-01-08 ENCOUNTER — Telehealth: Payer: Self-pay | Admitting: Pharmacist

## 2017-01-08 NOTE — Telephone Encounter (Signed)
Sent fax to Dr. Lynne Leader at [phone]  Notification of Severe DDI b/w Corlanor(Ivabradine) and Diltiazem. Combination should be avoided, recommendation to d/c Corlanor.   Denice Paradise, PharmD, RPh [facility]) [phone]

## 2017-01-10 ENCOUNTER — Ambulatory Visit: Payer: Self-pay | Admitting: Ophthalmology

## 2017-01-11 ENCOUNTER — Ambulatory Visit: Payer: Self-pay

## 2017-01-16 ENCOUNTER — Encounter: Payer: BLUE CROSS/BLUE SHIELD | Attending: Family Medicine | Admitting: *Deleted

## 2017-01-16 VITALS — Ht 69.0 in | Wt 367.7 lb

## 2017-01-16 DIAGNOSIS — F319 Bipolar disorder, unspecified: Secondary | ICD-10-CM | POA: Insufficient documentation

## 2017-01-16 DIAGNOSIS — G4730 Sleep apnea, unspecified: Secondary | ICD-10-CM | POA: Insufficient documentation

## 2017-01-16 DIAGNOSIS — J45909 Unspecified asthma, uncomplicated: Secondary | ICD-10-CM | POA: Insufficient documentation

## 2017-01-16 DIAGNOSIS — G4733 Obstructive sleep apnea (adult) (pediatric): Secondary | ICD-10-CM

## 2017-01-16 DIAGNOSIS — I2510 Atherosclerotic heart disease of native coronary artery without angina pectoris: Secondary | ICD-10-CM | POA: Insufficient documentation

## 2017-01-16 NOTE — Patient Instructions (Signed)
Patient Instructions  Patient Details  Name: ODYSSEY VASBINDER MRN: [id number] Date of Birth: [birth year] Referring Provider:  Emogene Morgan, MD  Below are the personal goals you chose as well as exercise and nutrition goals. Our goal is to help you keep on track towards obtaining and maintaining your goals. We will be discussing your progress on these goals with you throughout the program.  Initial Exercise Prescription:     Initial Exercise Prescription - 01/16/17 1200      Date of Initial Exercise RX and Referring Provider   Date 01/16/17   Referring Provider Aycock     NuStep   Level 1   Minutes 15   METs 2     Arm Ergometer   Level 1   Minutes 15   METs 2     T5 Nustep   Level 1   Minutes 15  3/3/3/3   METs 2     Biostep-RELP   Level 1   Minutes 15  3/3/3/3   METs 2     Intensity   THRR 40-80% of Max Heartrate 132-172   Ratings of Perceived Exertion 11-15   Perceived Dyspnea 0-4     Progression   Progression Continue to progress workloads to maintain intensity without signs/symptoms of physical distress.     Resistance Training   Training Prescription Yes   Weight 2   Reps 10-15      Exercise Goals: Frequency: Be able to perform aerobic exercise three times per week working toward 3-5 days per week.  Intensity: Work with a perceived exertion of 11 (fairly light) - 15 (hard) as tolerated. Follow your new exercise prescription and watch for changes in prescription as you progress with the program. Changes will be reviewed with you when they are made.  Duration: You should be able to do 30 minutes of continuous aerobic exercise in addition to a 5 minute warm-up and a 5 minute cool-down routine.  Nutrition Goals: Your personal nutrition goals will be established when you do your nutrition analysis with the dietician.  The following are nutrition guidelines to follow: Cholesterol < 200mg /day Sodium < 1500mg /day Fiber: Women under 50 yrs - 25 grams per  day  Personal Goals:     Personal Goals and Risk Factors at Admission - 01/16/17 1338      Core Components/Risk Factors/Patient Goals on Admission    Weight Management Yes;Weight Loss;Obesity   Intervention Weight Management: Develop a combined nutrition and exercise program designed to reach desired caloric intake, while maintaining appropriate intake of nutrient and fiber, sodium and fats, and appropriate energy expenditure required for the weight goal.;Weight Management/Obesity: Establish reasonable short term and long term weight goals.;Obesity: Provide education and appropriate resources to help participant work on and attain dietary goals.   Expected Outcomes Short Term: Continue to assess and modify interventions until short term weight is achieved;Weight Loss: Understanding of general recommendations for a balanced deficit meal plan, which promotes 1-2 lb weight loss per week and includes a negative energy balance of [phone] kcal/d   Sedentary Yes   Intervention Provide advice, education, support and counseling about physical activity/exercise needs.;Develop an individualized exercise prescription for aerobic and resistive training based on initial evaluation findings, risk stratification, comorbidities and participant's personal goals.   Expected Outcomes Achievement of increased cardiorespiratory fitness and enhanced flexibility, muscular endurance and strength shown through measurements of functional capacity and personal statement of participant.   Increase Strength and Stamina Yes   Intervention Provide advice, education,  support and counseling about physical activity/exercise needs.;Develop an individualized exercise prescription for aerobic and resistive training based on initial evaluation findings, risk stratification, comorbidities and participant's personal goals.   Expected Outcomes Achievement of increased cardiorespiratory fitness and enhanced flexibility, muscular endurance  and strength shown through measurements of functional capacity and personal statement of participant.   Develop more efficient breathing techniques such as purse lipped breathing and diaphragmatic breathing; and practicing self-pacing with activity Yes   Intervention Provide education, demonstration and support about specific breathing techniuqes utilized for more efficient breathing. Include techniques such as pursed lipped breathing, diaphragmatic breathing and self-pacing activity.   Expected Outcomes Short Term: Participant will be able to demonstrate and use breathing techniques as needed throughout daily activities.   Increase knowledge of respiratory medications and ability to use respiratory devices properly  Yes   Intervention Provide education and demonstration as needed of appropriate use of medications, inhalers, and oxygen therapy.   Expected Outcomes Short Term: Achieves understanding of medications use. Understands that oxygen is a medication prescribed by physician. Demonstrates appropriate use of inhaler and oxygen therapy.   Hypertension Yes   Intervention Provide education on lifestyle modifcations including regular physical activity/exercise, weight management, moderate sodium restriction and increased consumption of fresh fruit, vegetables, and low fat dairy, alcohol moderation, and smoking cessation.;Monitor prescription use compliance.   Expected Outcomes Short Term: Continued assessment and intervention until BP is < 140/69mm HG in hypertensive participants. < 130/76mm HG in hypertensive participants with diabetes, heart failure or chronic kidney disease.;Long Term: Maintenance of blood pressure at goal levels.      Tobacco Use Initial Evaluation: History  Smoking Status  . Never Smoker  Smokeless Tobacco  . Never Used    Copy of goals given to participant.

## 2017-01-16 NOTE — Progress Notes (Signed)
Pulmonary Individual Treatment Plan  Patient Details  Name: Andrea Miller MRN: [id number] Date of Birth: [birth year] Referring Provider:   Flowsheet Row Pulmonary Rehab from 01/16/2017 in [facility] Cardiac and Pulmonary Rehab  Referring Provider  Aycock      Initial Encounter Date:  Flowsheet Row Pulmonary Rehab from 01/16/2017 in [facility] Cardiac and Pulmonary Rehab  Date  01/16/17  Referring Provider  Aycock      Visit Diagnosis: OSA (obstructive sleep apnea)  Patient's Home Medications on Admission:  Current Outpatient Prescriptions:  .  albuterol (PROVENTIL) (2.5 MG/3ML) 0.083% nebulizer solution, Take 2.5 mg by nebulization every 4 (four) hours as needed for wheezing or shortness of breath., Disp: , Rfl:  .  amphetamine-dextroamphetamine (ADDERALL XR) 15 MG 24 hr capsule, Take 15 mg by mouth every morning., Disp: , Rfl:  .  beclomethasone (QVAR) 40 MCG/ACT inhaler, Inhale 2 puffs into the lungs 2 (two) times daily., Disp: , Rfl:  .  Carbamazepine (EQUETRO) 300 MG CP12, Take by mouth every morning., Disp: , Rfl:  .  Cariprazine HCl (VRAYLAR) 3 MG CAPS, Take by mouth at bedtime., Disp: , Rfl:  .  clonazePAM (KLONOPIN) 2 MG tablet, Take 2 mg by mouth at bedtime as needed for anxiety., Disp: , Rfl:  .  Dexlansoprazole 30 MG capsule, Take 30 mg by mouth every morning., Disp: , Rfl:  .  diltiazem (CARDIZEM CD) 240 MG 24 hr capsule, Take 240 mg by mouth every morning., Disp: , Rfl:  .  folic acid (FOLVITE) 1 MG tablet, , Disp: , Rfl: 10 .  furosemide (LASIX) 20 MG tablet, Take 20 mg by mouth every morning., Disp: , Rfl:  .  gabapentin (NEURONTIN) 300 MG capsule, Take 300 mg by mouth 3 (three) times daily., Disp: , Rfl:  .  ibuprofen (ADVIL,MOTRIN) 600 MG tablet, Take 1 tablet (600 mg total) by mouth every 6 (six) hours as needed., Disp: 30 tablet, Rfl: 0 .  metoprolol (LOPRESSOR) 100 MG tablet, Take 200 mg by mouth every morning. , Disp: , Rfl:  .  orlistat (XENICAL) 120 MG capsule, Take 120  mg by mouth 3 (three) times daily with meals., Disp: , Rfl:  .  potassium chloride (K-DUR) 10 MEQ tablet, Take 10 mEq by mouth every morning., Disp: , Rfl:  .  Prenatal Vit-Fe Fumarate-FA (PNV PRENATAL PLUS MULTIVITAMIN) 27-1 MG TABS, , Disp: , Rfl: 10 .  tiZANidine (ZANAFLEX) 4 MG capsule, Take 4 mg by mouth 3 (three) times daily as needed for muscle spasms., Disp: , Rfl:  .  benzonatate (TESSALON PERLES) 100 MG capsule, Take 1 capsule (100 mg total) by mouth 3 (three) times daily as needed for cough. (Patient not taking: Reported on 09/05/2016), Disp: 30 capsule, Rfl: 0 .  brompheniramine-pseudoephedrine-DM 30-2-10 MG/5ML syrup, Take 5 mLs by mouth 4 (four) times daily as needed. (Patient not taking: Reported on 01/16/2017), Disp: 120 mL, Rfl: 0 .  celecoxib (CELEBREX) 100 MG capsule, Take 100 mg by mouth 2 (two) times daily., Disp: , Rfl:  .  ibuprofen (ADVIL,MOTRIN) 800 MG tablet, Take 1 tablet (800 mg total) by mouth every 8 (eight) hours as needed (pain). (Patient not taking: Reported on 01/16/2017), Disp: 30 tablet, Rfl: 0 .  lidocaine (XYLOCAINE) 2 % solution, Use as directed 5 mLs in the mouth or throat every 6 (six) hours as needed for mouth pain. Mixed with 5 ML's all Bromfed-DM for swish and swallow (Patient not taking: Reported on 01/16/2017), Disp: 100 mL, Rfl: 0 .  oxyCODONE-acetaminophen (ROXICET) 5-325 MG tablet, Take 1-2 tablets by mouth every 4 (four) hours as needed for severe pain. (Patient not taking: Reported on 01/16/2017), Disp: 50 tablet, Rfl: 0  Past Medical History: Past Medical History:  Diagnosis Date  . Anxiety   . Bipolar disorder (HCC)   . Chest pain    and angina  . Depression   . Fibromyalgia   . Hypertension   . 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 Obesity   . Seizures (HCC)    secondary to anxiety  . SVT (supraventricular tachycardia) (HCC)    with hx of syncope; managed by [facility]    Tobacco Use: History  Smoking  Status  . Never Smoker  Smokeless Tobacco  . Never Used    Labs: Recent Review Flowsheet Data    There is no flowsheet data to display.       ADL UCSD:     Pulmonary Assessment Scores    Row Name 01/16/17 1224         ADL UCSD   ADL Phase Entry     SOB Score total 101     Rest 4     Walk 5     Stairs 5     Bath 4     Dress 4     Shop 4        Pulmonary Function Assessment:   Exercise Target Goals: Date: 01/16/17  Exercise Program Goal: Individual exercise prescription set with THRR, safety & activity barriers. Participant demonstrates ability to understand and report RPE using BORG scale, to self-measure pulse accurately, and to acknowledge the importance of the exercise prescription.  Exercise Prescription Goal: Starting with aerobic activity 30 plus minutes a day, 3 days per week for initial exercise prescription. Provide home exercise prescription and guidelines that participant acknowledges understanding prior to discharge.  Activity Barriers & Risk Stratification:     Activity Barriers & Cardiac Risk Stratification - 01/16/17 1220      Activity Barriers & Cardiac Risk Stratification   Comments recent history of seizure like activity.  Back problem, disc that refers pain down leg constantly.  Has chest pain when HR gets high.       6 Minute Walk:     6 Minute Walk    Row Name 01/16/17 1242         6 Minute Walk   Distance 400 feet     Walk Time 2.6 minutes     MPH 1.75     METS 2.22     RPE 17     Perceived Dyspnea  3     VO2 Peak 7.78     Symptoms Yes (comment)     Comments Felt dizzy and reported chest pain at 2:42 - all vitals normal - she has spoken with cardiologist about this and sees Dr Friday     Resting HR 92 bpm     Resting BP 142/74     Max Ex. HR 116 bpm     Max Ex. BP 138/82       Interval HR   Baseline HR 92     1 Minute HR 115     2 Minute HR 116     Interval Heart Rate? Yes       Interval Oxygen   Interval  Oxygen? Yes     Baseline Oxygen Saturation % 96 %     1 Minute Oxygen Saturation % 99 %  2 Minute Oxygen Saturation % 100 %        Initial Exercise Prescription:     Initial Exercise Prescription - 01/16/17 1200      Date of Initial Exercise RX and Referring Provider   Date 01/16/17   Referring Provider Aycock     NuStep   Level 1   Minutes 15   METs 2     Arm Ergometer   Level 1   Minutes 15   METs 2     T5 Nustep   Level 1   Minutes 15  3/3/3/3   METs 2     Biostep-RELP   Level 1   Minutes 15  3/3/3/3   METs 2     Intensity   THRR 40-80% of Max Heartrate 132-172   Ratings of Perceived Exertion 11-15   Perceived Dyspnea 0-4     Progression   Progression Continue to progress workloads to maintain intensity without signs/symptoms of physical distress.     Resistance Training   Training Prescription Yes   Weight 2   Reps 10-15      Perform Capillary Blood Glucose checks as needed.  Exercise Prescription Changes:     Exercise Prescription Changes    Row Name 01/16/17 1200             Response to Exercise   Blood Pressure (Admit) 142/74       Blood Pressure (Exercise) 138/72       Heart Rate (Admit) 92 bpm       Heart Rate (Exercise) 116 bpm       Oxygen Saturation (Admit) 96 %       Oxygen Saturation (Exercise) 99 %          Exercise Comments:   Discharge Exercise Prescription (Final Exercise Prescription Changes):     Exercise Prescription Changes - 01/16/17 1200      Response to Exercise   Blood Pressure (Admit) 142/74   Blood Pressure (Exercise) 138/72   Heart Rate (Admit) 92 bpm   Heart Rate (Exercise) 116 bpm   Oxygen Saturation (Admit) 96 %   Oxygen Saturation (Exercise) 99 %       Nutrition:  Target Goals: Understanding of nutrition guidelines, daily intake of sodium 1500mg , cholesterol 200mg , calories 30% from fat and 7% or less from saturated fats, daily to have 5 or more servings of fruits and  vegetables.  Biometrics:     Pre Biometrics - 01/16/17 1239      Pre Biometrics   Height 5\' 9"  (1.753 m)   Weight (!)  367 lb 11.2 oz (166.8 kg)   Waist Circumference 53 inches   Hip Circumference 63 inches   Waist to Hip Ratio 0.84 %   BMI (Calculated) 54.4       Nutrition Therapy Plan and Nutrition Goals:     Nutrition Therapy & Goals - 01/16/17 1205      Intervention Plan   Intervention Prescribe, educate and counsel regarding individualized specific dietary modifications aiming towards targeted core components such as weight, hypertension, lipid management, diabetes, heart failure and other comorbidities.   Expected Outcomes Short Term Goal: Understand basic principles of dietary content, such as calories, fat, sodium, cholesterol and nutrients.;Short Term Goal: A plan has been developed with personal nutrition goals set during dietitian appointment.;Long Term Goal: Adherence to prescribed nutrition plan.      Nutrition Discharge: Rate Your Plate Scores:   Psychosocial: Target Goals: Acknowledge presence or absence of depression, maximize  coping skills, provide positive support system. Participant is able to verbalize types and ability to use techniques and skills needed for reducing stress and depression.  Initial Review & Psychosocial Screening:     Initial Psych Review & Screening - 01/16/17 1207      Initial Review   Current issues with Current Depression;Current Anxiety/Panic;Current Sleep Concerns;Current Psychotropic Meds  Sleeping 2-3 hours a night  wears CPAP. Has physician and counselor for depression, sees on a regular basis     Family Dynamics   Good Support System? Yes  Mother     Barriers   Psychosocial barriers to participate in program There are no identifiable barriers or psychosocial needs.     Screening Interventions   Interventions Encouraged to exercise      Quality of Life Scores:     Quality of Life - 01/16/17 1233      Quality  of Life Scores   Health/Function Pre 21 %   Socioeconomic Pre 21 %   Psych/Spiritual Pre 21 %   Family Pre 21 %   GLOBAL Pre 21 %      PHQ-9: Recent Review Flowsheet Data    Depression screen [facility] 2/9 01/16/2017   Decreased Interest 3   Down, Depressed, Hopeless 3   PHQ - 2 Score 6   Altered sleeping 3   Tired, decreased energy 3   Change in appetite 3   Feeling bad or failure about yourself  3   Trouble concentrating 3   Moving slowly or fidgety/restless 3   Suicidal thoughts 3   PHQ-9 Score 27   Difficult doing work/chores Extremely dIfficult      Psychosocial Evaluation and Intervention:   Psychosocial Re-Evaluation:  Education: Education Goals: Education classes will be provided on a weekly basis, covering required topics. Participant will state understanding/return demonstration of topics presented.  Learning Barriers/Preferences:     Learning Barriers/Preferences - 01/16/17 1221      Learning Barriers/Preferences   Learning Barriers None   Learning Preferences None      Education Topics: Initial Evaluation Education: - Verbal, written and demonstration of respiratory meds, RPE/PD scales, oximetry and breathing techniques. Instruction on use of nebulizers and MDIs: cleaning and proper use, rinsing mouth with steroid doses and importance of monitoring MDI activations.   General Nutrition Guidelines/Fats and Fiber: -Group instruction provided by verbal, written material, models and posters to present the general guidelines for heart healthy nutrition. Gives an explanation and review of dietary fats and fiber.   Controlling Sodium/Reading Food Labels: -Group verbal and written material supporting the discussion of sodium use in heart healthy nutrition. Review and explanation with models, verbal and written materials for utilization of the food label.   Exercise Physiology & Risk Factors: - Group verbal and written instruction with models to review the  exercise physiology of the cardiovascular system and associated critical values. Details cardiovascular disease risk factors and the goals associated with each risk factor.   Aerobic Exercise & Resistance Training: - Gives group verbal and written discussion on the health impact of inactivity. On the components of aerobic and resistive training programs and the benefits of this training and how to safely progress through these programs.   Flexibility, Balance, General Exercise Guidelines: - Provides group verbal and written instruction on the benefits of flexibility and balance training programs. Provides general exercise guidelines with specific guidelines to those with heart or lung disease. Demonstration and skill practice provided.   Stress Management: - Provides group verbal and  written instruction about the health risks of elevated stress, cause of high stress, and healthy ways to reduce stress.   Depression: - Provides group verbal and written instruction on the correlation between heart/lung disease and depressed mood, treatment options, and the stigmas associated with seeking treatment.   Exercise & Equipment Safety: - Individual verbal instruction and demonstration of equipment use and safety with use of the equipment. Flowsheet Row Pulmonary Rehab from 01/16/2017 in [facility] Cardiac and Pulmonary Rehab  Date  01/16/17  Educator  Sb  Instruction Review Code  2- meets goals/outcomes      Infection Prevention: - Provides verbal and written material to individual with discussion of infection control including proper hand washing and proper equipment cleaning during exercise session. Flowsheet Row Pulmonary Rehab from 01/16/2017 in [facility] Cardiac and Pulmonary Rehab  Date  01/16/17  Educator  Sb  Instruction Review Code  2- meets goals/outcomes      Falls Prevention: - Provides verbal and written material to individual with discussion of falls prevention and safety. Flowsheet Row  Pulmonary Rehab from 01/16/2017 in [facility] Cardiac and Pulmonary Rehab  Date  01/16/17  Educator  Sb  Instruction Review Code  2- meets goals/outcomes      Diabetes: - Individual verbal and written instruction to review signs/symptoms of diabetes, desired ranges of glucose level fasting, after meals and with exercise. Advice that pre and post exercise glucose checks will be done for 3 sessions at entry of program.   Chronic Lung Diseases: - Group verbal and written instruction to review new updates, new respiratory medications, new advancements in procedures and treatments. Provide informative websites and "800" numbers of self-education.   Lung Procedures: - Group verbal and written instruction to describe testing methods done to diagnose lung disease. Review the outcome of test results. Describe the treatment choices: Pulmonary Function Tests, ABGs and oximetry.   Energy Conservation: - Provide group verbal and written instruction for methods to conserve energy, plan and organize activities. Instruct on pacing techniques, use of adaptive equipment and posture/positioning to relieve shortness of breath.   Triggers: - Group verbal and written instruction to review types of environmental controls: home humidity, furnaces, filters, dust mite/pet prevention, HEPA vacuums. To discuss weather changes, air quality and the benefits of nasal washing.   Exacerbations: - Group verbal and written instruction to provide: warning signs, infection symptoms, calling MD promptly, preventive modes, and value of vaccinations. Review: effective airway clearance, coughing and/or vibration techniques. Create an Sport and exercise psychologist.   Oxygen: - Individual and group verbal and written instruction on oxygen therapy. Includes supplement oxygen, available portable oxygen systems, continuous and intermittent flow rates, oxygen safety, concentrators, and Medicare reimbursement for oxygen.   Respiratory Medications: -  Group verbal and written instruction to review medications for lung disease. Drug class, frequency, complications, importance of spacers, rinsing mouth after steroid MDI's, and proper cleaning methods for nebulizers.   AED/CPR: - Group verbal and written instruction with the use of models to demonstrate the basic use of the AED with the basic ABC's of resuscitation.   Breathing Retraining: - Provides individuals verbal and written instruction on purpose, frequency, and proper technique of diaphragmatic breathing and pursed-lipped breathing. Applies individual practice skills.   Anatomy and Physiology of the Lungs: - Group verbal and written instruction with the use of models to provide basic lung anatomy and physiology related to function, structure and complications of lung disease.   Heart Failure: - Group verbal and written instruction on the basics of  heart failure: signs/symptoms, treatments, explanation of ejection fraction, enlarged heart and cardiomyopathy.   Sleep Apnea: - Individual verbal and written instruction to review Obstructive Sleep Apnea. Review of risk factors, methods for diagnosing and types of masks and machines for OSA.   Anxiety: - Provides group, verbal and written instruction on the correlation between heart/lung disease and anxiety, treatment options, and management of anxiety.   Relaxation: - Provides group, verbal and written instruction about the benefits of relaxation for patients with heart/lung disease. Also provides patients with examples of relaxation techniques.   Knowledge Questionnaire Score:     Knowledge Questionnaire Score - 01/16/17 1221      Knowledge Questionnaire Score   Pre Score 3/10 correct  REviewed correct responses with [place] today. She verbalized understanding of correct response       Core Components/Risk Factors/Patient Goals at Admission:     Personal Goals and Risk Factors at Admission - 01/16/17 1218      Core  Components/Risk Factors/Patient Goals on Admission   Lipids Yes  States checked off by accident. Not listed in clinical documents.       Core Components/Risk Factors/Patient Goals Review:    Core Components/Risk Factors/Patient Goals at Discharge (Final Review):    ITP Comments:     ITP Comments    Row Name 01/16/17 1156           ITP Comments Medical review completed. Initial ITP created. Documentation of diagnosis can be found scanned into Media file in [facility]          Comments:

## 2017-01-22 DIAGNOSIS — G4730 Sleep apnea, unspecified: Secondary | ICD-10-CM | POA: Diagnosis not present

## 2017-01-22 DIAGNOSIS — G4733 Obstructive sleep apnea (adult) (pediatric): Secondary | ICD-10-CM

## 2017-01-22 NOTE — Progress Notes (Signed)
Daily Session Note  Patient Details  Name: Andrea Miller MRN: [id number] Date of Birth: Mar 23, 1986 Referring Provider:   Flowsheet Row Pulmonary Rehab from 01/16/2017 in [facility] Cardiac and Pulmonary Rehab  Referring Provider  Aycock      Encounter Date: 01/22/2017  Check In:     Session Check In - 01/22/17 1522      Check-In   Location [facility]-Cardiac & Pulmonary Rehab   Staff Present Carson Myrtle, BS, RRT, Respiratory Therapist;Kelly Amedeo Plenty, BS, ACSM CEP, Exercise Physiologist;Christopher Glasscock Oletta Darter, BA, ACSM CEP, Exercise Physiologist   Supervising physician immediately available to respond to emergencies [facility] immediately available ER MD   Physician(s) Jimmye Norman and Quentin Cornwall   Medication changes reported     No   Fall or balance concerns reported    No   Warm-up and Cool-down Performed as group-led Location manager Performed Yes   VAD Patient? No           Exercise Prescription Changes - 01/22/17 1500      Response to Exercise   Blood Pressure (Admit) 142/80   Blood Pressure (Exercise) 130/80   Blood Pressure (Exit) 136/90   Heart Rate (Admit) 115 bpm   Heart Rate (Exercise) 112 bpm   Heart Rate (Exit) 96 bpm   Oxygen Saturation (Admit) 100 %   Oxygen Saturation (Exercise) 98 %   Oxygen Saturation (Exit) 97 %   Rating of Perceived Exertion (Exercise) 17   Perceived Dyspnea (Exercise) 3   Duration Progress to 45 minutes of aerobic exercise without signs/symptoms of physical distress   Intensity THRR unchanged     Resistance Training   Training Prescription Yes   Weight 2   Reps 10-15     Arm Ergometer   Level 1   Minutes 15     T5 Nustep   Level 1   Minutes 15   METs 1.7     Biostep-RELP   Level 1   Minutes 15      History  Smoking Status  . Never Smoker  Smokeless Tobacco  . Never Used    Goals Met:  Proper associated with RPD/PD & O2 Sat Independence with exercise equipment Exercise tolerated well Strength training completed  today  Goals Unmet:  Not Applicable  Comments: First full day of exercise!  Patient was oriented to gym and equipment including functions, settings, policies, and procedures.  Patient's individual exercise prescription and treatment plan were reviewed.  All starting workloads were established based on the results of the 6 minute walk test done at initial orientation visit.  The plan for exercise progression was also introduced and progression will be customized based on patient's performance and goals.    Dr. Emily Filbert is Medical Director for [place] and [facility]monary Rehabilitation.

## 2017-01-26 ENCOUNTER — Encounter: Payer: BLUE CROSS/BLUE SHIELD | Attending: Family Medicine | Admitting: *Deleted

## 2017-01-26 DIAGNOSIS — G4733 Obstructive sleep apnea (adult) (pediatric): Secondary | ICD-10-CM

## 2017-01-26 DIAGNOSIS — G4730 Sleep apnea, unspecified: Secondary | ICD-10-CM | POA: Diagnosis not present

## 2017-01-26 NOTE — Progress Notes (Signed)
Daily Session Note  Patient Details  Name: ASIANNA BRUNDAGE MRN: [id number] Date of Birth: [birth year] Referring Provider:   Flowsheet Row Pulmonary Rehab from 01/16/2017 in [facility] Cardiac and Pulmonary Rehab  Referring Provider  Aycock      Encounter Date: 01/26/2017  Check In:     Session Check In - 01/26/17 1237      Check-In   Location [facility]-Cardiac & Pulmonary Rehab   Staff Present Alberteen Sam, MA, ACSM RCEP, Exercise Physiologist;Patricia Surles RN BSN   Supervising physician immediately available to respond to emergencies [facility] immediately available ER MD   Physician(s) Dr. Reita Cliche and Clearnce Hasten   Medication changes reported     No   Fall or balance concerns reported    No   Warm-up and Cool-down Performed on first and last piece of equipment   Resistance Training Performed No  arrived late   VAD Patient? No     Pain Assessment   Currently in Pain? No/denies   Multiple Pain Sites No         History  Smoking Status  . Never Smoker  Smokeless Tobacco  . Never Used    Goals Met:  Proper associated with RPD/PD & O2 Sat Exercise tolerated well Queuing for purse lip breathing Strength training completed today  Goals Unmet:  Not Applicable  Comments: Pt able to follow exercise prescription today without complaint.  Will continue to monitor for progression.    Dr. Emily Filbert is Medical Director for [place] and [facility].

## 2017-01-31 ENCOUNTER — Encounter: Payer: Self-pay | Admitting: Respiratory Therapy

## 2017-01-31 ENCOUNTER — Telehealth: Payer: Self-pay | Admitting: Emergency Medicine

## 2017-01-31 ENCOUNTER — Telehealth: Payer: Self-pay | Admitting: Respiratory Therapy

## 2017-01-31 DIAGNOSIS — G4733 Obstructive sleep apnea (adult) (pediatric): Secondary | ICD-10-CM

## 2017-01-31 NOTE — Telephone Encounter (Signed)
Andrea Miller's mother called today to inform us that Andrea Miller has bronchitis and will not be attending [facility] today.

## 2017-01-31 NOTE — Telephone Encounter (Signed)
Called Andrea Miller to check on her ; last attended 01/26/17. Left a message.

## 2017-02-02 ENCOUNTER — Telehealth: Payer: Self-pay | Admitting: *Deleted

## 2017-02-02 NOTE — Telephone Encounter (Signed)
Andrea Miller stopped by to inform us that she is still not feeling well from her bronchitis and hopes to be able to start back to [facility] class on Monday 3/12.

## 2017-02-05 ENCOUNTER — Encounter: Payer: Self-pay | Admitting: *Deleted

## 2017-02-05 DIAGNOSIS — G4733 Obstructive sleep apnea (adult) (pediatric): Secondary | ICD-10-CM

## 2017-02-05 NOTE — Progress Notes (Signed)
Pulmonary Individual Treatment Plan  Patient Details  Name: Andrea Miller MRN: [id number] Date of Birth: [birth year] Referring Provider:   Flowsheet Row Pulmonary Rehab from 01/16/2017 in [facility] Cardiac and Pulmonary Rehab  Referring Provider  Aycock      Initial Encounter Date:  Flowsheet Row Pulmonary Rehab from 01/16/2017 in [facility] Cardiac and Pulmonary Rehab  Date  01/16/17  Referring Provider  Aycock      Visit Diagnosis: OSA (obstructive sleep apnea)  Patient's Home Medications on Admission:  Current Outpatient Prescriptions:  .  albuterol (PROVENTIL) (2.5 MG/3ML) 0.083% nebulizer solution, Take 2.5 mg by nebulization every 4 (four) hours as needed for wheezing or shortness of breath., Disp: , Rfl:  .  amphetamine-dextroamphetamine (ADDERALL XR) 15 MG 24 hr capsule, Take 15 mg by mouth every morning., Disp: , Rfl:  .  beclomethasone (QVAR) 40 MCG/ACT inhaler, Inhale 2 puffs into the lungs 2 (two) times daily., Disp: , Rfl:  .  benzonatate (TESSALON PERLES) 100 MG capsule, Take 1 capsule (100 mg total) by mouth 3 (three) times daily as needed for cough. (Patient not taking: Reported on 09/05/2016), Disp: 30 capsule, Rfl: 0 .  brompheniramine-pseudoephedrine-DM 30-2-10 MG/5ML syrup, Take 5 mLs by mouth 4 (four) times daily as needed. (Patient not taking: Reported on 01/16/2017), Disp: 120 mL, Rfl: 0 .  Carbamazepine (EQUETRO) 300 MG CP12, Take by mouth every morning., Disp: , Rfl:  .  Cariprazine HCl (VRAYLAR) 3 MG CAPS, Take by mouth at bedtime., Disp: , Rfl:  .  celecoxib (CELEBREX) 100 MG capsule, Take 100 mg by mouth 2 (two) times daily., Disp: , Rfl:  .  clonazePAM (KLONOPIN) 2 MG tablet, Take 2 mg by mouth at bedtime as needed for anxiety., Disp: , Rfl:  .  Dexlansoprazole 30 MG capsule, Take 30 mg by mouth every morning., Disp: , Rfl:  .  diltiazem (CARDIZEM CD) 240 MG 24 hr capsule, Take 240 mg by mouth every morning., Disp: , Rfl:  .  folic acid (FOLVITE) 1 MG tablet, , Disp:  , Rfl: 10 .  furosemide (LASIX) 20 MG tablet, Take 20 mg by mouth every morning., Disp: , Rfl:  .  gabapentin (NEURONTIN) 300 MG capsule, Take 300 mg by mouth 3 (three) times daily., Disp: , Rfl:  .  ibuprofen (ADVIL,MOTRIN) 600 MG tablet, Take 1 tablet (600 mg total) by mouth every 6 (six) hours as needed., Disp: 30 tablet, Rfl: 0 .  ibuprofen (ADVIL,MOTRIN) 800 MG tablet, Take 1 tablet (800 mg total) by mouth every 8 (eight) hours as needed (pain). (Patient not taking: Reported on 01/16/2017), Disp: 30 tablet, Rfl: 0 .  lidocaine (XYLOCAINE) 2 % solution, Use as directed 5 mLs in the mouth or throat every 6 (six) hours as needed for mouth pain. Mixed with 5 ML's all Bromfed-DM for swish and swallow (Patient not taking: Reported on 01/16/2017), Disp: 100 mL, Rfl: 0 .  metoprolol (LOPRESSOR) 100 MG tablet, Take 200 mg by mouth every morning. , Disp: , Rfl:  .  orlistat (XENICAL) 120 MG capsule, Take 120 mg by mouth 3 (three) times daily with meals., Disp: , Rfl:  .  oxyCODONE-acetaminophen (ROXICET) 5-325 MG tablet, Take 1-2 tablets by mouth every 4 (four) hours as needed for severe pain. (Patient not taking: Reported on 01/16/2017), Disp: 50 tablet, Rfl: 0 .  potassium chloride (K-DUR) 10 MEQ tablet, Take 10 mEq by mouth every morning., Disp: , Rfl:  .  Prenatal Vit-Fe Fumarate-FA (PNV PRENATAL PLUS MULTIVITAMIN) 27-1 MG  TABS, , Disp: , Rfl: 10 .  tiZANidine (ZANAFLEX) 4 MG capsule, Take 4 mg by mouth 3 (three) times daily as needed for muscle spasms., Disp: , Rfl:   Past Medical History: Past Medical History:  Diagnosis Date  . Anxiety   . Bipolar disorder ([place])   . Chest pain    and angina  . Depression   . Fibromyalgia   . Hypertension   . 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 Obesity   . Seizures ([place])    secondary to anxiety  . SVT (supraventricular tachycardia) ([facility])    with hx of syncope; managed by [facility]    Tobacco Use: History  Smoking  Status  . Never Smoker  Smokeless Tobacco  . Never Used    Labs: Recent Review Flowsheet Data    There is no flowsheet data to display.       ADL UCSD:     Pulmonary Assessment Scores    Row Name 01/16/17 1224         ADL UCSD   ADL Phase Entry     SOB Score total 101     Rest 4     Walk 5     Stairs 5     Bath 4     Dress 4     Shop 4        Pulmonary Function Assessment:   Exercise Target Goals:    Exercise Program Goal: Individual exercise prescription set with THRR, safety & activity barriers. Participant demonstrates ability to understand and report RPE using BORG scale, to self-measure pulse accurately, and to acknowledge the importance of the exercise prescription.  Exercise Prescription Goal: Starting with aerobic activity 30 plus minutes a day, 3 days per week for initial exercise prescription. Provide home exercise prescription and guidelines that participant acknowledges understanding prior to discharge.  Activity Barriers & Risk Stratification:     Activity Barriers & Cardiac Risk Stratification - 01/16/17 1220      Activity Barriers & Cardiac Risk Stratification   Comments recent history of seizure like activity.  Back problem, disc that refers pain down leg constantly.  Has chest pain when HR gets high.       6 Minute Walk:     6 Minute Walk    Row Name 01/16/17 1242         6 Minute Walk   Distance 400 feet     Walk Time 2.6 minutes     MPH 1.75     METS 2.22     RPE 17     Perceived Dyspnea  3     VO2 Peak 7.78     Symptoms Yes (comment)     Comments Felt dizzy and reported chest pain at 2:42 - all vitals normal - she has spoken with cardiologist about this and sees Dr Friday     Resting HR 92 bpm     Resting BP 142/74     Max Ex. HR 116 bpm     Max Ex. BP 138/82       Interval HR   Baseline HR 92     1 Minute HR 115     2 Minute HR 116     Interval Heart Rate? Yes       Interval Oxygen   Interval Oxygen? Yes      Baseline Oxygen Saturation % 96 %     1 Minute Oxygen Saturation %  99 %     2 Minute Oxygen Saturation % 100 %       Oxygen Initial Assessment:   Oxygen Re-Evaluation:   Oxygen Discharge (Final Oxygen Re-Evaluation):   Initial Exercise Prescription:     Initial Exercise Prescription - 01/16/17 1200      Date of Initial Exercise RX and Referring Provider   Date 01/16/17   Referring Provider Aycock     NuStep   Level 1   Minutes 15   METs 2     Arm Ergometer   Level 1   Minutes 15   METs 2     T5 Nustep   Level 1   Minutes 15  3/3/3/3   METs 2     Biostep-RELP   Level 1   Minutes 15  3/3/3/3   METs 2     Intensity   THRR 40-80% of Max Heartrate 132-172   Ratings of Perceived Exertion 11-15   Perceived Dyspnea 0-4     Progression   Progression Continue to progress workloads to maintain intensity without signs/symptoms of physical distress.     Resistance Training   Training Prescription Yes   Weight 2   Reps 10-15      Perform Capillary Blood Glucose checks as needed.  Exercise Prescription Changes:     Exercise Prescription Changes    Row Name 01/16/17 1200 01/22/17 1500           Response to Exercise   Blood Pressure (Admit) 142/74 142/80      Blood Pressure (Exercise) 138/72 130/80      Blood Pressure (Exit)  - 136/90      Heart Rate (Admit) 92 bpm 115 bpm      Heart Rate (Exercise) 116 bpm 112 bpm      Heart Rate (Exit)  - 96 bpm      Oxygen Saturation (Admit) 96 % 100 %      Oxygen Saturation (Exercise) 99 % 98 %      Oxygen Saturation (Exit)  - 97 %      Rating of Perceived Exertion (Exercise)  - 17      Perceived Dyspnea (Exercise)  - 3      Symptoms  - none      Comments  - frist full day of exercise      Duration  - Progress to 45 minutes of aerobic exercise without signs/symptoms of physical distress      Intensity  - THRR unchanged        Resistance Training   Training Prescription  - Yes      Weight  - 2      Reps  -  10-15        Arm Ergometer   Level  - 1      Minutes  - 15        T5 Nustep   Level  - 1      SPM  - 60      Minutes  - 15      METs  - 1.7        Biostep-RELP   Level  - 1      SPM  - 50      Minutes  - 15         Exercise Comments:     Exercise Comments    Row Name 01/22/17 1529           Exercise Comments First full day  of exercise!  Patient was oriented to gym and equipment including functions, settings, policies, and procedures.  Patient's individual exercise prescription and treatment plan were reviewed.  All starting workloads were established based on the results of the 6 minute walk test done at initial orientation visit.  The plan for exercise progression was also introduced and progression will be customized based on patient's performance and goals.          Exercise Goals and Review:   Exercise Goals Re-Evaluation :   Discharge Exercise Prescription (Final Exercise Prescription Changes):     Exercise Prescription Changes - 01/22/17 1500      Response to Exercise   Blood Pressure (Admit) 142/80   Blood Pressure (Exercise) 130/80   Blood Pressure (Exit) 136/90   Heart Rate (Admit) 115 bpm   Heart Rate (Exercise) 112 bpm   Heart Rate (Exit) 96 bpm   Oxygen Saturation (Admit) 100 %   Oxygen Saturation (Exercise) 98 %   Oxygen Saturation (Exit) 97 %   Rating of Perceived Exertion (Exercise) 17   Perceived Dyspnea (Exercise) 3   Symptoms none   Comments frist full day of exercise   Duration Progress to 45 minutes of aerobic exercise without signs/symptoms of physical distress   Intensity THRR unchanged     Resistance Training   Training Prescription Yes   Weight 2   Reps 10-15     Arm Ergometer   Level 1   Minutes 15     T5 Nustep   Level 1   SPM 60   Minutes 15   METs 1.7     Biostep-RELP   Level 1   SPM 50   Minutes 15      Nutrition:  Target Goals: Understanding of nutrition guidelines, daily intake of sodium <1531m,  cholesterol <2062m calories 30% from fat and 7% or less from saturated fats, daily to have 5 or more servings of fruits and vegetables.  Biometrics:     Pre Biometrics - 01/16/17 1239      Pre Biometrics   Height 5' 9"  (1.753 m)   Weight (!)  367 lb 11.2 oz (166.8 kg)   Waist Circumference 53 inches   Hip Circumference 63 inches   Waist to Hip Ratio 0.84 %   BMI (Calculated) 54.4       Nutrition Therapy Plan and Nutrition Goals:     Nutrition Therapy & Goals - 01/16/17 1205      Intervention Plan   Intervention Prescribe, educate and counsel regarding individualized specific dietary modifications aiming towards targeted core components such as weight, hypertension, lipid management, diabetes, heart failure and other comorbidities.   Expected Outcomes Short Term Goal: Understand basic principles of dietary content, such as calories, fat, sodium, cholesterol and nutrients.;Short Term Goal: A plan has been developed with personal nutrition goals set during dietitian appointment.;Long Term Goal: Adherence to prescribed nutrition plan.      Nutrition Discharge: Rate Your Plate Scores:   Nutrition Goals Re-Evaluation:   Nutrition Goals Discharge (Final Nutrition Goals Re-Evaluation):   Psychosocial: Target Goals: Acknowledge presence or absence of significant depression and/or stress, maximize coping skills, provide positive support system. Participant is able to verbalize types and ability to use techniques and skills needed for reducing stress and depression.   Initial Review & Psychosocial Screening:     Initial Psych Review & Screening - 01/16/17 1207      Initial Review   Current issues with Current Depression;Current Anxiety/Panic;Current Sleep Concerns;Current Psychotropic Meds  Sleeping 2-3 hours a night  wears CPAP. Has physician and counselor for depression, sees on a regular basis     [place]? Yes  Mother     Barriers    Psychosocial barriers to participate in program There are no identifiable barriers or psychosocial needs.     Screening Interventions   Interventions Encouraged to exercise      Quality of Life Scores:     Quality of Life - 01/16/17 1233      Quality of Life Scores   Health/Function Pre 21 %   Socioeconomic Pre 21 %   Psych/Spiritual Pre 21 %   Family Pre 21 %   GLOBAL Pre 21 %      PHQ-9: Recent Review Flowsheet Data    Depression screen [facility] 2/9 01/16/2017   Decreased Interest 3   Down, Depressed, Hopeless 3   PHQ - 2 Score 6   Altered sleeping 3   Tired, decreased energy 3   Change in appetite 3   Feeling bad or failure about yourself  3   Trouble concentrating 3   Moving slowly or fidgety/restless 3   Suicidal thoughts 3   PHQ-9 Score 27   Difficult doing work/chores Extremely dIfficult     Interpretation of Total Score  Total Score Depression Severity:  1-4 = Minimal depression, 5-9 = Mild depression, 10-14 = Moderate depression, 15-19 = Moderately severe depression, 20-27 = Severe depression   Psychosocial Evaluation and Intervention:     Psychosocial Evaluation - 01/22/17 1219      Psychosocial Evaluation & Interventions   Interventions Encouraged to exercise with the program and follow exercise prescription;Relaxation education;Stress management education   Comments Counselor met with Andrea Miller Ardeen Fillers) today for initial psychosocial evaluation.  She is a 31 year old who has multiple health issues including asthma and sleep apnea; as well as heart problems and Bipolar affective disorder.  Briceyda has a good support system with her mother close by and she is actively involved in a peer support group for her mental health issues.  She is disabled subsequent to a back injury (2) years ago and also has pseudo-seizures several times daily.  Dalyla reports sleeping only 3-4 hours nightly; which has improved a little with the CPAP machine.  Her appetite is "okay."   She reports depression and anxiety symptoms for several years (since 2011) when she was diagnosed with Bipolar.  She reports having psychotic episodes and hears voices daily.  Oneida is actively followed by her psychiatrist; her therapist and her peer support group for her mental health problems.  She states her health and her finances are her primary stressors.  Saanya reports thoughts of suicide/dying daily; but denies a plan and states has had no attempts in several years.   Her goals for this program are to improve her health overall. Staff will be following with Ardeen Fillers throughout the course of this program.     Expected Outcomes Jera is expected to improve her health with consistent exercise; including sleeping better and breathing better.  She also would like a "better heart rate" which should occur with consistent exercise as well.  With improved health - hopes that improved mental health will occur as well for Andrea Miller.     Continue Psychosocial Services  Follow up required by counselor      Psychosocial Re-Evaluation:   Psychosocial Discharge (Final Psychosocial Re-Evaluation):   Education: Education Goals: Education classes will be provided on a  weekly basis, covering required topics. Participant will state understanding/return demonstration of topics presented.  Learning Barriers/Preferences:     Learning Barriers/Preferences - 01/16/17 1221      Learning Barriers/Preferences   Learning Barriers None   Learning Preferences None      Education Topics: Initial Evaluation Education: - Verbal, written and demonstration of respiratory meds, RPE/PD scales, oximetry and breathing techniques. Instruction on use of nebulizers and MDIs: cleaning and proper use, rinsing mouth with steroid doses and importance of monitoring MDI activations.   General Nutrition Guidelines/Fats and Fiber: -Group instruction provided by verbal, written material, models and posters to present the  general guidelines for heart healthy nutrition. Gives an explanation and review of dietary fats and fiber.   Controlling Sodium/Reading Food Labels: -Group verbal and written material supporting the discussion of sodium use in heart healthy nutrition. Review and explanation with models, verbal and written materials for utilization of the food label.   Exercise Physiology & Risk Factors: - Group verbal and written instruction with models to review the exercise physiology of the cardiovascular system and associated critical values. Details cardiovascular disease risk factors and the goals associated with each risk factor.   Aerobic Exercise & Resistance Training: - Gives group verbal and written discussion on the health impact of inactivity. On the components of aerobic and resistive training programs and the benefits of this training and how to safely progress through these programs.   Flexibility, Balance, General Exercise Guidelines: - Provides group verbal and written instruction on the benefits of flexibility and balance training programs. Provides general exercise guidelines with specific guidelines to those with heart or lung disease. Demonstration and skill practice provided.   Stress Management: - Provides group verbal and written instruction about the health risks of elevated stress, cause of high stress, and healthy ways to reduce stress.   Depression: - Provides group verbal and written instruction on the correlation between heart/lung disease and depressed mood, treatment options, and the stigmas associated with seeking treatment.   Exercise & Equipment Safety: - Individual verbal instruction and demonstration of equipment use and safety with use of the equipment. Flowsheet Row Pulmonary Rehab from 01/16/2017 in [facility] Cardiac and Pulmonary Rehab  Date  01/16/17  Educator  Sb  Instruction Review Code  2- meets goals/outcomes      Infection Prevention: - Provides verbal  and written material to individual with discussion of infection control including proper hand washing and proper equipment cleaning during exercise session. Flowsheet Row Pulmonary Rehab from 01/16/2017 in [facility] Cardiac and Pulmonary Rehab  Date  01/16/17  Educator  Sb  Instruction Review Code  2- meets goals/outcomes      Falls Prevention: - Provides verbal and written material to individual with discussion of falls prevention and safety. Flowsheet Row Pulmonary Rehab from 01/16/2017 in [facility] Cardiac and Pulmonary Rehab  Date  01/16/17  Educator  Sb  Instruction Review Code  2- meets goals/outcomes      Diabetes: - Individual verbal and written instruction to review signs/symptoms of diabetes, desired ranges of glucose level fasting, after meals and with exercise. Advice that pre and post exercise glucose checks will be done for 3 sessions at entry of program.   Chronic Lung Diseases: - Group verbal and written instruction to review new updates, new respiratory medications, new advancements in procedures and treatments. Provide informative websites and "800" numbers of self-education.   Lung Procedures: - Group verbal and written instruction to describe testing methods done to diagnose lung disease.  Review the outcome of test results. Describe the treatment choices: Pulmonary Function Tests, ABGs and oximetry.   Energy Conservation: - Provide group verbal and written instruction for methods to conserve energy, plan and organize activities. Instruct on pacing techniques, use of adaptive equipment and posture/positioning to relieve shortness of breath.   Triggers: - Group verbal and written instruction to review types of environmental controls: home humidity, furnaces, filters, dust mite/pet prevention, HEPA vacuums. To discuss weather changes, air quality and the benefits of nasal washing.   Exacerbations: - Group verbal and written instruction to provide: warning signs, infection  symptoms, calling MD promptly, preventive modes, and value of vaccinations. Review: effective airway clearance, coughing and/or vibration techniques. Create an Sports administrator.   Oxygen: - Individual and group verbal and written instruction on oxygen therapy. Includes supplement oxygen, available portable oxygen systems, continuous and intermittent flow rates, oxygen safety, concentrators, and Medicare reimbursement for oxygen.   Respiratory Medications: - Group verbal and written instruction to review medications for lung disease. Drug class, frequency, complications, importance of spacers, rinsing mouth after steroid MDI's, and proper cleaning methods for nebulizers.   AED/CPR: - Group verbal and written instruction with the use of models to demonstrate the basic use of the AED with the basic ABC's of resuscitation.   Breathing Retraining: - Provides individuals verbal and written instruction on purpose, frequency, and proper technique of diaphragmatic breathing and pursed-lipped breathing. Applies individual practice skills.   Anatomy and Physiology of the Lungs: - Group verbal and written instruction with the use of models to provide basic lung anatomy and physiology related to function, structure and complications of lung disease.   Heart Failure: - Group verbal and written instruction on the basics of heart failure: signs/symptoms, treatments, explanation of ejection fraction, enlarged heart and cardiomyopathy.   Sleep Apnea: - Individual verbal and written instruction to review Obstructive Sleep Apnea. Review of risk factors, methods for diagnosing and types of masks and machines for OSA.   Anxiety: - Provides group, verbal and written instruction on the correlation between heart/lung disease and anxiety, treatment options, and management of anxiety.   Relaxation: - Provides group, verbal and written instruction about the benefits of relaxation for patients with heart/lung  disease. Also provides patients with examples of relaxation techniques.   Knowledge Questionnaire Score:     Knowledge Questionnaire Score - 01/16/17 1221      Knowledge Questionnaire Score   Pre Score 3/10 correct  REviewed correct responses with Andrea Miller today. She verbalized understanding of correct response       Core Components/Risk Factors/Patient Goals at Admission:     Personal Goals and Risk Factors at Admission - 01/16/17 1338      Core Components/Risk Factors/Patient Goals on Admission    Weight Management Yes;Weight Loss;Obesity   Intervention Weight Management: Develop a combined nutrition and exercise program designed to reach desired caloric intake, while maintaining appropriate intake of nutrient and fiber, sodium and fats, and appropriate energy expenditure required for the weight goal.;Weight Management/Obesity: Establish reasonable short term and long term weight goals.;Obesity: Provide education and appropriate resources to help participant work on and attain dietary goals.   Expected Outcomes Short Term: Continue to assess and modify interventions until short term weight is achieved;Weight Loss: Understanding of general recommendations for a balanced deficit meal plan, which promotes 1-2 lb weight loss per week and includes a negative energy balance of [phone] kcal/d   Sedentary Yes   Intervention Provide advice, education, support and counseling about physical  activity/exercise needs.;Develop an individualized exercise prescription for aerobic and resistive training based on initial evaluation findings, risk stratification, comorbidities and participant's personal goals.   Expected Outcomes Achievement of increased cardiorespiratory fitness and enhanced flexibility, muscular endurance and strength shown through measurements of functional capacity and personal statement of participant.   Increase Strength and Stamina Yes   Intervention Provide advice, education,  support and counseling about physical activity/exercise needs.;Develop an individualized exercise prescription for aerobic and resistive training based on initial evaluation findings, risk stratification, comorbidities and participant's personal goals.   Expected Outcomes Achievement of increased cardiorespiratory fitness and enhanced flexibility, muscular endurance and strength shown through measurements of functional capacity and personal statement of participant.   Develop more efficient breathing techniques such as purse lipped breathing and diaphragmatic breathing; and practicing self-pacing with activity Yes   Intervention Provide education, demonstration and support about specific breathing techniuqes utilized for more efficient breathing. Include techniques such as pursed lipped breathing, diaphragmatic breathing and self-pacing activity.   Expected Outcomes Short Term: Participant will be able to demonstrate and use breathing techniques as needed throughout daily activities.   Increase knowledge of respiratory medications and ability to use respiratory devices properly  Yes   Intervention Provide education and demonstration as needed of appropriate use of medications, inhalers, and oxygen therapy.   Expected Outcomes Short Term: Achieves understanding of medications use. Understands that oxygen is a medication prescribed by physician. Demonstrates appropriate use of inhaler and oxygen therapy.   Hypertension Yes   Intervention Provide education on lifestyle modifcations including regular physical activity/exercise, weight management, moderate sodium restriction and increased consumption of fresh fruit, vegetables, and low fat dairy, alcohol moderation, and smoking cessation.;Monitor prescription use compliance.   Expected Outcomes Short Term: Continued assessment and intervention until BP is < 140/65m HG in hypertensive participants. < 130/871mHG in hypertensive participants with diabetes, heart  failure or chronic kidney disease.;Long Term: Maintenance of blood pressure at goal levels.      Core Components/Risk Factors/Patient Goals Review:    Core Components/Risk Factors/Patient Goals at Discharge (Final Review):    ITP Comments:     ITP Comments    Row Name 01/16/17 1156 01/31/17 1449         ITP Comments Medical review completed. Initial ITP created. Documentation of diagnosis can be found [facility] ; last attended 01/26/17. Left a message.         Comments: 30 Day Review

## 2017-02-06 ENCOUNTER — Encounter: Payer: Self-pay | Admitting: *Deleted

## 2017-02-06 DIAGNOSIS — G4733 Obstructive sleep apnea (adult) (pediatric): Secondary | ICD-10-CM

## 2017-02-19 ENCOUNTER — Encounter: Payer: Self-pay | Admitting: *Deleted

## 2017-02-19 ENCOUNTER — Emergency Department
Admission: EM | Admit: 2017-02-19 | Discharge: 2017-02-19 | Disposition: A | Discharge status: Home / Self Care | Payer: BLUE CROSS/BLUE SHIELD | Attending: Emergency Medicine | Admitting: Emergency Medicine

## 2017-02-19 ENCOUNTER — Emergency Department: Payer: BLUE CROSS/BLUE SHIELD

## 2017-02-19 DIAGNOSIS — M545 Low back pain: Secondary | ICD-10-CM | POA: Insufficient documentation

## 2017-02-19 DIAGNOSIS — I10 Essential (primary) hypertension: Secondary | ICD-10-CM | POA: Diagnosis not present

## 2017-02-19 DIAGNOSIS — Z79899 Other long term (current) drug therapy: Secondary | ICD-10-CM | POA: Diagnosis not present

## 2017-02-19 DIAGNOSIS — Y9389 Activity, other specified: Secondary | ICD-10-CM | POA: Insufficient documentation

## 2017-02-19 DIAGNOSIS — I2510 Atherosclerotic heart disease of native coronary artery without angina pectoris: Secondary | ICD-10-CM | POA: Insufficient documentation

## 2017-02-19 DIAGNOSIS — Y92410 Unspecified street and highway as the place of occurrence of the external cause: Secondary | ICD-10-CM | POA: Diagnosis not present

## 2017-02-19 DIAGNOSIS — J45909 Unspecified asthma, uncomplicated: Secondary | ICD-10-CM | POA: Diagnosis not present

## 2017-02-19 DIAGNOSIS — M542 Cervicalgia: Secondary | ICD-10-CM | POA: Diagnosis not present

## 2017-02-19 DIAGNOSIS — Y999 Unspecified external cause status: Secondary | ICD-10-CM | POA: Insufficient documentation

## 2017-02-19 DIAGNOSIS — S3992XA Unspecified injury of lower back, initial encounter: Secondary | ICD-10-CM | POA: Diagnosis present

## 2017-02-19 LAB — POCT PREGNANCY, URINE: Preg Test, Ur: NEGATIVE

## 2017-02-19 MED ORDER — KETOROLAC TROMETHAMINE 30 MG/ML IJ SOLN
30.0000 mg | Freq: Once | INTRAMUSCULAR | Status: AC
  Administered 2017-02-19: 30 mg via INTRAMUSCULAR
  Filled 2017-02-19: qty 1

## 2017-02-19 MED ORDER — MELOXICAM 7.5 MG PO TABS: 7.5000 mg | ORAL_TABLET | Freq: Every day | ORAL | 1 refills | Status: AC

## 2017-02-19 MED ORDER — ONDANSETRON 4 MG PO TBDP
4.0000 mg | ORAL_TABLET | Freq: Once | ORAL | Status: AC
  Administered 2017-02-19: 4 mg via ORAL
  Filled 2017-02-19: qty 1

## 2017-02-19 MED ORDER — TIZANIDINE HCL 4 MG PO TABS: 4.0000 mg | ORAL_TABLET | Freq: Three times a day (TID) | ORAL | 0 refills | Status: AC

## 2017-02-19 NOTE — ED Notes (Signed)
Pt states she couldn't pee right now but would let us know when she could
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
## 2017-02-20 ENCOUNTER — Encounter: Payer: Self-pay | Admitting: Respiratory Therapy

## 2017-02-20 ENCOUNTER — Telehealth: Payer: Self-pay | Admitting: Respiratory Therapy

## 2017-02-20 DIAGNOSIS — G4733 Obstructive sleep apnea (adult) (pediatric): Secondary | ICD-10-CM

## 2017-02-20 NOTE — Progress Notes (Signed)
Discharge Summary  Patient Details  Name: Andrea Miller MRN: [id number] Date of Birth: 1986/01/01 Referring Provider:     Pulmonary Rehab from 01/16/2017 in [facility]  Referring Provider  [place]       Number of Visits: 3 Reason for Discharge:  Early Exit:  Back to work  Smoking History:  History  Smoking Status   Never Smoker  Smokeless Tobacco   Never Used    Diagnosis:  OSA (obstructive sleep apnea)  ADL UCSD:     Pulmonary Assessment Scores    Row Name 01/16/17 1224         ADL UCSD   ADL Phase Entry     SOB Score total 101     Rest 4     Walk 5     Stairs 5     Bath 4     Dress 4     Shop 4        Initial Exercise Prescription:     Initial Exercise Prescription - 01/16/17 1200      Date of Initial Exercise RX and Referring Provider   Date 01/16/17   Referring Provider Aycock     NuStep   Level 1   Minutes 15   METs 2     Arm Ergometer   Level 1   Minutes 15   METs 2     T5 Nustep   Level 1   Minutes 15  3/3/3/3   METs 2     Biostep-RELP   Level 1   Minutes 15  3/3/3/3   METs 2     Intensity   THRR 40-80% of Max Heartrate 132-172   Ratings of Perceived Exertion 11-15   Perceived Dyspnea 0-4     Progression   Progression Continue to progress workloads to maintain intensity without signs/symptoms of physical distress.     Resistance Training   Training Prescription Yes   Weight 2   Reps 10-15      Discharge Exercise Prescription (Final Exercise Prescription Changes):     Exercise Prescription Changes - 02/06/17 1600      Response to Exercise   Blood Pressure (Admit) 124/74   Blood Pressure (Exercise) 132/64   Blood Pressure (Exit) 148/88   Heart Rate (Admit) 110 bpm   Heart Rate (Exercise) 120 bpm   Heart Rate (Exit) 84 bpm   Oxygen Saturation (Admit) 100 %   Oxygen Saturation (Exercise) 96 %   Oxygen Saturation (Exit) 98 %   Rating of Perceived Exertion (Exercise) 17   Perceived  Dyspnea (Exercise) 2   Symptoms SOB   Duration Progress to 45 minutes of aerobic exercise without signs/symptoms of physical distress   Intensity THRR unchanged     Progression   Progression Continue to progress workloads to maintain intensity without signs/symptoms of physical distress.   Average METs 1.87     Resistance Training   Training Prescription Yes   Weight 2 lbs   Reps 10-15     Recumbant Bike   Level 1   RPM 50   Minutes 15   METs 1.7     T5 Nustep   Level 1   SPM 73   Minutes 7   METs 1.9     Biostep-RELP   Level 1   SPM 32   Minutes 15   METs 2      Functional Capacity:     6 Minute Walk    Row Name 01/16/17  1242         6 Minute Walk   Distance 400 feet     Walk Time 2.6 minutes     MPH 1.75     METS 2.22     RPE 17     Perceived Dyspnea  3     VO2 Peak 7.78     Symptoms Yes (comment)     Comments Felt dizzy and reported chest pain at 2:42 - all vitals normal - she has spoken with cardiologist about this and sees Dr Friday     Resting HR 92 bpm     Resting BP 142/74     Max Ex. HR 116 bpm     Max Ex. BP 138/82       Interval HR   Baseline HR 92     1 Minute HR 115     2 Minute HR 116     Interval Heart Rate? Yes       Interval Oxygen   Interval Oxygen? Yes     Baseline Oxygen Saturation % 96 %     1 Minute Oxygen Saturation % 99 %     2 Minute Oxygen Saturation % 100 %        Psychological, QOL, Others - Outcomes: PHQ 2/9: Depression screen PHQ 2/9 01/16/2017  Decreased Interest 3  Down, Depressed, Hopeless 3  PHQ - 2 Score 6  Altered sleeping 3  Tired, decreased energy 3  Change in appetite 3  Feeling bad or failure about yourself  3  Trouble concentrating 3  Moving slowly or fidgety/restless 3  Suicidal thoughts 3  PHQ-9 Score 27  Difficult doing work/chores Extremely dIfficult    Quality of Life:     Quality of Life - 01/16/17 1233      Quality of Life Scores   Health/Function Pre 21 %   Socioeconomic Pre  21 %   Psych/Spiritual Pre 21 %   Family Pre 21 %   GLOBAL Pre 21 %      Personal Goals: Goals established at orientation with interventions provided to work toward goal.     Personal Goals and Risk Factors at Admission - 01/16/17 1338      Core Components/Risk Factors/Patient Goals on Admission    Weight Management Yes;Weight Loss;Obesity   Intervention Weight Management: Develop a combined nutrition and exercise program designed to reach desired caloric intake, while maintaining appropriate intake of nutrient and fiber, sodium and fats, and appropriate energy expenditure required for the weight goal.;Weight Management/Obesity: Establish reasonable short term and long term weight goals.;Obesity: Provide education and appropriate resources to help participant work on and attain dietary goals.   Expected Outcomes Short Term: Continue to assess and modify interventions until short term weight is achieved;Weight Loss: Understanding of general recommendations for a balanced deficit meal plan, which promotes 1-2 lb weight loss per week and includes a negative energy balance of [phone] kcal/d   Sedentary Yes   Intervention Provide advice, education, support and counseling about physical activity/exercise needs.;Develop an individualized exercise prescription for aerobic and resistive training based on initial evaluation findings, risk stratification, comorbidities and participant's personal goals.   Expected Outcomes Achievement of increased cardiorespiratory fitness and enhanced flexibility, muscular endurance and strength shown through measurements of functional capacity and personal statement of participant.   Increase Strength and Stamina Yes   Intervention Provide advice, education, support and counseling about physical activity/exercise needs.;Develop an individualized exercise prescription for aerobic and resistive training based on initial  evaluation findings, risk stratification,  comorbidities and participant's personal goals.   Expected Outcomes Achievement of increased cardiorespiratory fitness and enhanced flexibility, muscular endurance and strength shown through measurements of functional capacity and personal statement of participant.   Develop more efficient breathing techniques such as purse lipped breathing and diaphragmatic breathing; and practicing self-pacing with activity Yes   Intervention Provide education, demonstration and support about specific breathing techniuqes utilized for more efficient breathing. Include techniques such as pursed lipped breathing, diaphragmatic breathing and self-pacing activity.   Expected Outcomes Short Term: Participant will be able to demonstrate and use breathing techniques as needed throughout daily activities.   Increase knowledge of respiratory medications and ability to use respiratory devices properly  Yes   Intervention Provide education and demonstration as needed of appropriate use of medications, inhalers, and oxygen therapy.   Expected Outcomes Short Term: Achieves understanding of medications use. Understands that oxygen is a medication prescribed by physician. Demonstrates appropriate use of inhaler and oxygen therapy.   Hypertension Yes   Intervention Provide education on lifestyle modifcations including regular physical activity/exercise, weight management, moderate sodium restriction and increased consumption of fresh fruit, vegetables, and low fat dairy, alcohol moderation, and smoking cessation.;Monitor prescription use compliance.   Expected Outcomes Short Term: Continued assessment and intervention until BP is < 140/60mm HG in hypertensive participants. < 130/72mm HG in hypertensive participants with diabetes, heart failure or chronic kidney disease.;Long Term: Maintenance of blood pressure at goal levels.       Personal Goals Discharge:   Nutrition & Weight - Outcomes:     Pre Biometrics - 01/16/17 1239       Pre Biometrics   Height 5\' 9"  (1.753 m)   Weight (!)  367 lb 11.2 oz (166.8 kg)   Waist Circumference 53 inches   Hip Circumference 63 inches   Waist to Hip Ratio 0.84 %   BMI (Calculated) 54.4       Nutrition:     Nutrition Therapy & Goals - 01/16/17 1205      Intervention Plan   Intervention Prescribe, educate and counsel regarding individualized specific dietary modifications aiming towards targeted core components such as weight, hypertension, lipid management, diabetes, heart failure and other comorbidities.   Expected Outcomes Short Term Goal: Understand basic principles of dietary content, such as calories, fat, sodium, cholesterol and nutrients.;Short Term Goal: A plan has been developed with personal nutrition goals set during dietitian appointment.;Long Term Goal: Adherence to prescribed nutrition plan.      Nutrition Discharge:   Education Questionnaire Score:     Knowledge Questionnaire Score - 01/16/17 1221      Knowledge Questionnaire Score   Pre Score 3/10 correct  REviewed correct responses with [place] today. She verbalized understanding of correct response      Goals reviewed with patient; copy given to patient.

## 2017-02-20 NOTE — Progress Notes (Signed)
Pulmonary Individual Treatment Plan  Patient Details  Name: Andrea Miller MRN: [id number] Date of Birth: [birth year] Referring Provider:     Pulmonary Rehab from 01/16/2017 in [facility] Cardiac and Pulmonary Rehab  Referring Provider  Aycock      Initial Encounter Date:    Pulmonary Rehab from 01/16/2017 in [facility] Cardiac and Pulmonary Rehab  Date  01/16/17  Referring Provider  Aycock      Visit Diagnosis: OSA (obstructive sleep apnea)  Patient's Home Medications on Admission:  Current Outpatient Prescriptions:    albuterol (PROVENTIL) (2.5 MG/3ML) 0.083% nebulizer solution, Take 2.5 mg by nebulization every 4 (four) hours as needed for wheezing or shortness of breath., Disp: , Rfl:    amphetamine-dextroamphetamine (ADDERALL XR) 15 MG 24 hr capsule, Take 15 mg by mouth every morning., Disp: , Rfl:    beclomethasone (QVAR) 40 MCG/ACT inhaler, Inhale 2 puffs into the lungs 2 (two) times daily., Disp: , Rfl:    benzonatate (TESSALON PERLES) 100 MG capsule, Take 1 capsule (100 mg total) by mouth 3 (three) times daily as needed for cough. (Patient not taking: Reported on 09/05/2016), Disp: 30 capsule, Rfl: 0   brompheniramine-pseudoephedrine-DM 30-2-10 MG/5ML syrup, Take 5 mLs by mouth 4 (four) times daily as needed. (Patient not taking: Reported on 01/16/2017), Disp: 120 mL, Rfl: 0   Carbamazepine (EQUETRO) 300 MG CP12, Take by mouth every morning., Disp: , Rfl:    Cariprazine HCl (VRAYLAR) 3 MG CAPS, Take by mouth at bedtime., Disp: , Rfl:    celecoxib (CELEBREX) 100 MG capsule, Take 100 mg by mouth 2 (two) times daily., Disp: , Rfl:    clonazePAM (KLONOPIN) 2 MG tablet, Take 2 mg by mouth at bedtime as needed for anxiety., Disp: , Rfl:    Dexlansoprazole 30 MG capsule, Take 30 mg by mouth every morning., Disp: , Rfl:    diltiazem (CARDIZEM CD) 240 MG 24 hr capsule, Take 240 mg by mouth every morning., Disp: , Rfl:    folic acid (FOLVITE) 1 MG tablet, , Disp: , Rfl: 10    furosemide (LASIX) 20 MG tablet, Take 20 mg by mouth every morning., Disp: , Rfl:    gabapentin (NEURONTIN) 300 MG capsule, Take 300 mg by mouth 3 (three) times daily., Disp: , Rfl:    ibuprofen (ADVIL,MOTRIN) 600 MG tablet, Take 1 tablet (600 mg total) by mouth every 6 (six) hours as needed., Disp: 30 tablet, Rfl: 0   ibuprofen (ADVIL,MOTRIN) 800 MG tablet, Take 1 tablet (800 mg total) by mouth every 8 (eight) hours as needed (pain). (Patient not taking: Reported on 01/16/2017), Disp: 30 tablet, Rfl: 0   lidocaine (XYLOCAINE) 2 % solution, Use as directed 5 mLs in the mouth or throat every 6 (six) hours as needed for mouth pain. Mixed with 5 ML's all Bromfed-DM for swish and swallow (Patient not taking: Reported on 01/16/2017), Disp: 100 mL, Rfl: 0   meloxicam (MOBIC) 7.5 MG tablet, Take 1 tablet (7.5 mg total) by mouth daily., Disp: 30 tablet, Rfl: 1   metoprolol (LOPRESSOR) 100 MG tablet, Take 200 mg by mouth every morning. , Disp: , Rfl:    orlistat (XENICAL) 120 MG capsule, Take 120 mg by mouth 3 (three) times daily with meals., Disp: , Rfl:    oxyCODONE-acetaminophen (ROXICET) 5-325 MG tablet, Take 1-2 tablets by mouth every 4 (four) hours as needed for severe pain. (Patient not taking: Reported on 01/16/2017), Disp: 50 tablet, Rfl: 0   potassium chloride (K-DUR) 10 MEQ tablet, Take  10 mEq by mouth every morning., Disp: , Rfl:    Prenatal Vit-Fe Fumarate-FA (PNV PRENATAL PLUS MULTIVITAMIN) 27-1 MG TABS, , Disp: , Rfl: 10   tiZANidine (ZANAFLEX) 4 MG tablet, Take 1 tablet (4 mg total) by mouth 3 (three) times daily., Disp: 10 tablet, Rfl: 0  Past Medical History: Past Medical History:  Diagnosis Date   Anxiety    Bipolar disorder ([place])    Chest pain    and angina   Depression    Fibromyalgia    Hypertension    Left lumbar radiculopathy since 08/2014   secondary to work accident   Obesity    Seizures ([place])    secondary to anxiety   SVT (supraventricular tachycardia)  (HCC)    with hx of syncope; managed by [facility]    Tobacco Use: History  Smoking Status   Never Smoker  Smokeless Tobacco   Never Used    Labs: Recent Review Flowsheet Data    There is no flowsheet data to display.       ADL UCSD:     Pulmonary Assessment Scores    Row Name 01/16/17 1224         ADL UCSD   ADL Phase Entry     SOB Score total 101     Rest 4     Walk 5     Stairs 5     Bath 4     Dress 4     Shop 4        Pulmonary Function Assessment:   Exercise Target Goals:    Exercise Program Goal: Individual exercise prescription set with THRR, safety & activity barriers. Participant demonstrates ability to understand and report RPE using BORG scale, to self-measure pulse accurately, and to acknowledge the importance of the exercise prescription.  Exercise Prescription Goal: Starting with aerobic activity 30 plus minutes a day, 3 days per week for initial exercise prescription. Provide home exercise prescription and guidelines that participant acknowledges understanding prior to discharge.  Activity Barriers & Risk Stratification:     Activity Barriers & Cardiac Risk Stratification - 01/16/17 1220      Activity Barriers & Cardiac Risk Stratification   Comments recent history of seizure like activity.  Back problem, disc that refers pain down leg constantly.  Has chest pain when HR gets high.       6 Minute Walk:     6 Minute Walk    Row Name 01/16/17 1242         6 Minute Walk   Distance 400 feet     Walk Time 2.6 minutes     MPH 1.75     METS 2.22     RPE 17     Perceived Dyspnea  3     VO2 Peak 7.78     Symptoms Yes (comment)     Comments Felt dizzy and reported chest pain at 2:42 - all vitals normal - she has spoken with cardiologist about this and sees Dr Friday     Resting HR 92 bpm     Resting BP 142/74     Max Ex. HR 116 bpm     Max Ex. BP 138/82       Interval HR   Baseline HR 92     1 Minute HR 115     2 Minute HR  116     Interval Heart Rate? Yes       Interval Oxygen   Interval Oxygen?  Yes     Baseline Oxygen Saturation % 96 %     1 Minute Oxygen Saturation % 99 %     2 Minute Oxygen Saturation % 100 %       Oxygen Initial Assessment:   Oxygen Re-Evaluation:   Oxygen Discharge (Final Oxygen Re-Evaluation):   Initial Exercise Prescription:     Initial Exercise Prescription - 01/16/17 1200      Date of Initial Exercise RX and Referring Provider   Date 01/16/17   Referring Provider Aycock     NuStep   Level 1   Minutes 15   METs 2     Arm Ergometer   Level 1   Minutes 15   METs 2     T5 Nustep   Level 1   Minutes 15  3/3/3/3   METs 2     Biostep-RELP   Level 1   Minutes 15  3/3/3/3   METs 2     Intensity   THRR 40-80% of Max Heartrate 132-172   Ratings of Perceived Exertion 11-15   Perceived Dyspnea 0-4     Progression   Progression Continue to progress workloads to maintain intensity without signs/symptoms of physical distress.     Resistance Training   Training Prescription Yes   Weight 2   Reps 10-15      Perform Capillary Blood Glucose checks as needed.  Exercise Prescription Changes:     Exercise Prescription Changes    Row Name 01/16/17 1200 01/22/17 1500 02/06/17 1600         Response to Exercise   Blood Pressure (Admit) 142/74 142/80 124/74     Blood Pressure (Exercise) 138/72 130/80 132/64     Blood Pressure (Exit)  -- 136/90 148/88     Heart Rate (Admit) 92 bpm 115 bpm 110 bpm     Heart Rate (Exercise) 116 bpm 112 bpm 120 bpm     Heart Rate (Exit)  -- 96 bpm 84 bpm     Oxygen Saturation (Admit) 96 % 100 % 100 %     Oxygen Saturation (Exercise) 99 % 98 % 96 %     Oxygen Saturation (Exit)  -- 97 % 98 %     Rating of Perceived Exertion (Exercise)  -- 17 17     Perceived Dyspnea (Exercise)  -- 3 2     Symptoms  -- none SOB     Comments  -- frist full day of exercise  --     Duration  -- Progress to 45 minutes of aerobic exercise  without signs/symptoms of physical distress Progress to 45 minutes of aerobic exercise without signs/symptoms of physical distress     Intensity  -- THRR unchanged THRR unchanged       Progression   Progression  --  -- Continue to progress workloads to maintain intensity without signs/symptoms of physical distress.     Average METs  --  -- 1.87       Resistance Training   Training Prescription  -- Yes Yes     Weight  -- 2 2 lbs     Reps  -- 10-15 10-15       Recumbant Bike   Level  --  -- 1     RPM  --  -- 50     Minutes  --  -- 15     METs  --  -- 1.7       Arm Ergometer   Level  --  1  --     Minutes  -- 15  --       T5 Nustep   Level  -- 1 1     SPM  -- 60 73     Minutes  -- 15 7     METs  -- 1.7 1.9       Biostep-RELP   Level  -- 1 1     SPM  -- 50 32     Minutes  -- 15 15     METs  --  -- 2        Exercise Comments:     Exercise Comments    Row Name 01/22/17 1529           Exercise Comments First full day of exercise!  Patient was oriented to gym and equipment including functions, settings, policies, and procedures.  Patient's individual exercise prescription and treatment plan were reviewed.  All starting workloads were established based on the results of the 6 minute walk test done at initial orientation visit.  The plan for exercise progression was also introduced and progression will be customized based on patient's performance and goals.          Exercise Goals and Review:     Exercise Goals    Row Name 02/06/17 1618             Exercise Goals   Increase Physical Activity Yes       Intervention Provide advice, education, support and counseling about physical activity/exercise needs.;Develop an individualized exercise prescription for aerobic and resistive training based on initial evaluation findings, risk stratification, comorbidities and participant's personal goals.       Expected Outcomes Achievement of increased cardiorespiratory fitness and  enhanced flexibility, muscular endurance and strength shown through measurements of functional capacity and personal statement of participant.       Increase Strength and Stamina Yes       Intervention Provide advice, education, support and counseling about physical activity/exercise needs.;Develop an individualized exercise prescription for aerobic and resistive training based on initial evaluation findings, risk stratification, comorbidities and participant's personal goals.       Expected Outcomes Achievement of increased cardiorespiratory fitness and enhanced flexibility, muscular endurance and strength shown through measurements of functional capacity and personal statement of participant.          Exercise Goals Re-Evaluation :     Exercise Goals Re-Evaluation    Row Name 02/06/17 1618             Exercise Goal Re-Evaluation   Exercise Goals Review Increase Physical Activity;Increase Strenth and Stamina       Comments Jones Bales has only attended one session since last review.  No real progress in goals.       Expected Outcomes Short and Long:  Attend rehab regularly          Discharge Exercise Prescription (Final Exercise Prescription Changes):     Exercise Prescription Changes - 02/06/17 1600      Response to Exercise   Blood Pressure (Admit) 124/74   Blood Pressure (Exercise) 132/64   Blood Pressure (Exit) 148/88   Heart Rate (Admit) 110 bpm   Heart Rate (Exercise) 120 bpm   Heart Rate (Exit) 84 bpm   Oxygen Saturation (Admit) 100 %   Oxygen Saturation (Exercise) 96 %   Oxygen Saturation (Exit) 98 %   Rating of Perceived Exertion (Exercise) 17   Perceived Dyspnea (Exercise) 2   Symptoms SOB  Duration Progress to 45 minutes of aerobic exercise without signs/symptoms of physical distress   Intensity THRR unchanged     Progression   Progression Continue to progress workloads to maintain intensity without signs/symptoms of physical distress.   Average METs 1.87      Resistance Training   Training Prescription Yes   Weight 2 lbs   Reps 10-15     Recumbant Bike   Level 1   RPM 50   Minutes 15   METs 1.7     T5 Nustep   Level 1   SPM 73   Minutes 7   METs 1.9     Biostep-RELP   Level 1   SPM 32   Minutes 15   METs 2      Nutrition:  Target Goals: Understanding of nutrition guidelines, daily intake of sodium <1589m, cholesterol <2024m calories 30% from fat and 7% or less from saturated fats, daily to have 5 or more servings of fruits and vegetables.  Biometrics:     Pre Biometrics - 01/16/17 1239      Pre Biometrics   Height 5' 9"  (1.753 m)   Weight (!)  367 lb 11.2 oz (166.8 kg)   Waist Circumference 53 inches   Hip Circumference 63 inches   Waist to Hip Ratio 0.84 %   BMI (Calculated) 54.4       Nutrition Therapy Plan and Nutrition Goals:     Nutrition Therapy & Goals - 01/16/17 1205      Intervention Plan   Intervention Prescribe, educate and counsel regarding individualized specific dietary modifications aiming towards targeted core components such as weight, hypertension, lipid management, diabetes, heart failure and other comorbidities.   Expected Outcomes Short Term Goal: Understand basic principles of dietary content, such as calories, fat, sodium, cholesterol and nutrients.;Short Term Goal: A plan has been developed with personal nutrition goals set during dietitian appointment.;Long Term Goal: Adherence to prescribed nutrition plan.      Nutrition Discharge: Rate Your Plate Scores:   Nutrition Goals Re-Evaluation:   Nutrition Goals Discharge (Final Nutrition Goals Re-Evaluation):   Psychosocial: Target Goals: Acknowledge presence or absence of significant depression and/or stress, maximize coping skills, provide positive support system. Participant is able to verbalize types and ability to use techniques and skills needed for reducing stress and depression.   Initial Review & Psychosocial Screening:      Initial Psych Review & Screening - 01/16/17 1207      Initial Review   Current issues with Current Depression;Current Anxiety/Panic;Current Sleep Concerns;Current Psychotropic Meds  Sleeping 2-3 hours a night  wears CPAP. Has physician and counselor for depression, sees on a regular basis     [place]  Mother     Barriers   Psychosocial barriers to participate in program There are no identifiable barriers or psychosocial needs.     Screening Interventions   Interventions Encouraged to exercise      Quality of Life Scores:     Quality of Life - 01/16/17 1233      Quality of Life Scores   Health/Function Pre 21 %   Socioeconomic Pre 21 %   Psych/Spiritual Pre 21 %   Family Pre 21 %   GLOBAL Pre 21 %      PHQ-9: Recent Review Flowsheet Data    [facility]   Decreased Interest 3   Down, Depressed, Hopeless 3   PHQ - 2 Score 6  Altered sleeping 3   Tired, decreased energy 3   Change in appetite 3   Feeling bad or failure about yourself  3   Trouble concentrating 3   Moving slowly or fidgety/restless 3   Suicidal thoughts 3   PHQ-9 Score 27   Difficult doing work/chores Extremely dIfficult     Interpretation of Total Score  Total Score Depression Severity:  1-4 = Minimal depression, 5-9 = Mild depression, 10-14 = Moderate depression, 15-19 = Moderately severe depression, 20-27 = Severe depression   Psychosocial Evaluation and Intervention:     Psychosocial Evaluation - 01/22/17 1219      Psychosocial Evaluation & Interventions   Interventions Encouraged to exercise with the program and follow exercise prescription;Relaxation education;Stress management education   Comments Counselor met with Ms. Gilford Rile Ardeen Fillers) today for initial psychosocial evaluation.  She is a 31 year old who has multiple health issues including asthma and sleep apnea; as well as heart problems and Bipolar affective disorder.   Angla has a good support system with her mother close by and she is actively involved in a peer support group for her mental health issues.  She is disabled subsequent to a back injury (2) years ago and also has pseudo-seizures several times daily.  Himani reports sleeping only 3-4 hours nightly; which has improved a little with the CPAP machine.  Her appetite is "okay."  She reports depression and anxiety symptoms for several years (since 2011) when she was diagnosed with Bipolar.  She reports having psychotic episodes and hears voices daily.  Mylia is actively followed by her psychiatrist; her therapist and her peer support group for her mental health problems.  She states her health and her finances are her primary stressors.  Shyne reports thoughts of suicide/dying daily; but denies a plan and states has had no attempts in several years.   Her goals for this program are to improve her health overall. Staff will be following with Ardeen Fillers throughout the course of this program.     Expected Outcomes Ruhi is expected to improve her health with consistent exercise; including sleeping better and breathing better.  She also would like a "better heart rate" which should occur with consistent exercise as well.  With improved health - hopes that improved mental health will occur as well for [place].     Continue Psychosocial Services  Follow up required by counselor      Psychosocial Re-Evaluation:   Psychosocial Discharge (Final Psychosocial Re-Evaluation):   Education: Education Goals: Education classes will be provided on a weekly basis, covering required topics. Participant will state understanding/return demonstration of topics presented.  Learning Barriers/Preferences:     Learning Barriers/Preferences - 01/16/17 1221      Learning Barriers/Preferences   Learning Barriers None   Learning Preferences None      Education Topics: Initial Evaluation Education: - Verbal, written and  demonstration of respiratory meds, RPE/PD scales, oximetry and breathing techniques. Instruction on use of nebulizers and MDIs: cleaning and proper use, rinsing mouth with steroid doses and importance of monitoring MDI activations.   General Nutrition Guidelines/Fats and Fiber: -Group instruction provided by verbal, written material, models and posters to present the general guidelines for heart healthy nutrition. Gives an explanation and review of dietary fats and fiber.   Controlling Sodium/Reading Food Labels: -Group verbal and written material supporting the discussion of sodium use in heart healthy nutrition. Review and explanation with models, verbal and written materials for utilization of the food label.  Exercise Physiology & Risk Factors: - Group verbal and written instruction with models to review the exercise physiology of the cardiovascular system and associated critical values. Details cardiovascular disease risk factors and the goals associated with each risk factor.   Aerobic Exercise & Resistance Training: - Gives group verbal and written discussion on the health impact of inactivity. On the components of aerobic and resistive training programs and the benefits of this training and how to safely progress through these programs.   Flexibility, Balance, General Exercise Guidelines: - Provides group verbal and written instruction on the benefits of flexibility and balance training programs. Provides general exercise guidelines with specific guidelines to those with heart or lung disease. Demonstration and skill practice provided.   Stress Management: - Provides group verbal and written instruction about the health risks of elevated stress, cause of high stress, and healthy ways to reduce stress.   Depression: - Provides group verbal and written instruction on the correlation between heart/lung disease and depressed mood, treatment options, and the stigmas associated with  seeking treatment.   Exercise & Equipment Safety: - Individual verbal instruction and demonstration of equipment use and safety with use of the equipment.   Pulmonary Rehab from 01/16/2017 in [facility] Cardiac and Pulmonary Rehab  Date  01/16/17  Educator  Sb  Instruction Review Code  2- meets goals/outcomes      Infection Prevention: - Provides verbal and written material to individual with discussion of infection control including proper hand washing and proper equipment cleaning during exercise session.   Pulmonary Rehab from 01/16/2017 in [facility] Cardiac and Pulmonary Rehab  Date  01/16/17  Educator  Sb  Instruction Review Code  2- meets goals/outcomes      Falls Prevention: - Provides verbal and written material to individual with discussion of falls prevention and safety.   Pulmonary Rehab from 01/16/2017 in [facility] Cardiac and Pulmonary Rehab  Date  01/16/17  Educator  Sb  Instruction Review Code  2- meets goals/outcomes      Diabetes: - Individual verbal and written instruction to review signs/symptoms of diabetes, desired ranges of glucose level fasting, after meals and with exercise. Advice that pre and post exercise glucose checks will be done for 3 sessions at entry of program.   Chronic Lung Diseases: - Group verbal and written instruction to review new updates, new respiratory medications, new advancements in procedures and treatments. Provide informative websites and "800" numbers of self-education.   Lung Procedures: - Group verbal and written instruction to describe testing methods done to diagnose lung disease. Review the outcome of test results. Describe the treatment choices: Pulmonary Function Tests, ABGs and oximetry.   Energy Conservation: - Provide group verbal and written instruction for methods to conserve energy, plan and organize activities. Instruct on pacing techniques, use of adaptive equipment and posture/positioning to relieve shortness of  breath.   Triggers: - Group verbal and written instruction to review types of environmental controls: home humidity, furnaces, filters, dust mite/pet prevention, HEPA vacuums. To discuss weather changes, air quality and the benefits of nasal washing.   Exacerbations: - Group verbal and written instruction to provide: warning signs, infection symptoms, calling MD promptly, preventive modes, and value of vaccinations. Review: effective airway clearance, coughing and/or vibration techniques. Create an Sports administrator.   Oxygen: - Individual and group verbal and written instruction on oxygen therapy. Includes supplement oxygen, available portable oxygen systems, continuous and intermittent flow rates, oxygen safety, concentrators, and Medicare reimbursement for oxygen.   Respiratory Medications: -  Group verbal and written instruction to review medications for lung disease. Drug class, frequency, complications, importance of spacers, rinsing mouth after steroid MDI's, and proper cleaning methods for nebulizers.   AED/CPR: - Group verbal and written instruction with the use of models to demonstrate the basic use of the AED with the basic ABC's of resuscitation.   Breathing Retraining: - Provides individuals verbal and written instruction on purpose, frequency, and proper technique of diaphragmatic breathing and pursed-lipped breathing. Applies individual practice skills.   Anatomy and Physiology of the Lungs: - Group verbal and written instruction with the use of models to provide basic lung anatomy and physiology related to function, structure and complications of lung disease.   Heart Failure: - Group verbal and written instruction on the basics of heart failure: signs/symptoms, treatments, explanation of ejection fraction, enlarged heart and cardiomyopathy.   Sleep Apnea: - Individual verbal and written instruction to review Obstructive Sleep Apnea. Review of risk factors, methods for  diagnosing and types of masks and machines for OSA.   Anxiety: - Provides group, verbal and written instruction on the correlation between heart/lung disease and anxiety, treatment options, and management of anxiety.   Relaxation: - Provides group, verbal and written instruction about the benefits of relaxation for patients with heart/lung disease. Also provides patients with examples of relaxation techniques.   Knowledge Questionnaire Score:     Knowledge Questionnaire Score - 01/16/17 1221      Knowledge Questionnaire Score   Pre Score 3/10 correct  REviewed correct responses with [place] today. She verbalized understanding of correct response       Core Components/Risk Factors/Patient Goals at Admission:     Personal Goals and Risk Factors at Admission - 01/16/17 1338      Core Components/Risk Factors/Patient Goals on Admission    Weight Management Yes;Weight Loss;Obesity   Intervention Weight Management: Develop a combined nutrition and exercise program designed to reach desired caloric intake, while maintaining appropriate intake of nutrient and fiber, sodium and fats, and appropriate energy expenditure required for the weight goal.;Weight Management/Obesity: Establish reasonable short term and long term weight goals.;Obesity: Provide education and appropriate resources to help participant work on and attain dietary goals.   Expected Outcomes Short Term: Continue to assess and modify interventions until short term weight is achieved;Weight Loss: Understanding of general recommendations for a balanced deficit meal plan, which promotes 1-2 lb weight loss per week and includes a negative energy balance of [phone] kcal/d   Sedentary Yes   Intervention Provide advice, education, support and counseling about physical activity/exercise needs.;Develop an individualized exercise prescription for aerobic and resistive training based on initial evaluation findings, risk stratification,  comorbidities and participant's personal goals.   Expected Outcomes Achievement of increased cardiorespiratory fitness and enhanced flexibility, muscular endurance and strength shown through measurements of functional capacity and personal statement of participant.   Increase Strength and Stamina Yes   Intervention Provide advice, education, support and counseling about physical activity/exercise needs.;Develop an individualized exercise prescription for aerobic and resistive training based on initial evaluation findings, risk stratification, comorbidities and participant's personal goals.   Expected Outcomes Achievement of increased cardiorespiratory fitness and enhanced flexibility, muscular endurance and strength shown through measurements of functional capacity and personal statement of participant.   Develop more efficient breathing techniques such as purse lipped breathing and diaphragmatic breathing; and practicing self-pacing with activity Yes   Intervention Provide education, demonstration and support about specific breathing techniuqes utilized for more efficient breathing. Include techniques such as pursed lipped  breathing, diaphragmatic breathing and self-pacing activity.   Expected Outcomes Short Term: Participant will be able to demonstrate and use breathing techniques as needed throughout daily activities.   Increase knowledge of respiratory medications and ability to use respiratory devices properly  Yes   Intervention Provide education and demonstration as needed of appropriate use of medications, inhalers, and oxygen therapy.   Expected Outcomes Short Term: Achieves understanding of medications use. Understands that oxygen is a medication prescribed by physician. Demonstrates appropriate use of inhaler and oxygen therapy.   Hypertension Yes   Intervention Provide education on lifestyle modifcations including regular physical activity/exercise, weight management, moderate sodium  restriction and increased consumption of fresh fruit, vegetables, and low fat dairy, alcohol moderation, and smoking cessation.;Monitor prescription use compliance.   Expected Outcomes Short Term: Continued assessment and intervention until BP is < 140/56m HG in hypertensive participants. < 130/840mHG in hypertensive participants with diabetes, heart failure or chronic kidney disease.;Long Term: Maintenance of blood pressure at goal levels.      Core Components/Risk Factors/Patient Goals Review:    Core Components/Risk Factors/Patient Goals at Discharge (Final Review):    ITP Comments:     ITP Comments    Row Name 01/16/17 1156 01/31/17 1449 02/20/17 1108 02/20/17 1203     ITP Comments Medical review completed. Initial ITP created. Documentation of diagnosis can be found [facility] [place] [facility] ; last attended 01/26/17. Left a message. Called Ms [place] last attended 01/26/17. Left a message to check on her health and confirm her returning to [place], [place]  - both last attended 01/26/17. Ms Wa[place] LaSharonleere starting a job working daytime hours and will not be able to attend [facility]. A better option for her and her mom is [facility] where they can still be supervised and have oximetry and blood pressures available and can attend in the early evenings. [facility] supervisor will contact them this week. I called and left a message for [place] these plans.       Comments: Called Ms [place], [place]  - both last attended 01/26/17. Ms WaNoguerand LaZanyiare starting a job working daytime hours and will not be able to attend [facility]. A better option for her and her mom is the [facility] where they can still be supervised and have oximetry and blood pressures available and can attend in the early evenings. [facility] supervisor will contact them this week. I called and left a message for La[place]vertaor these  plans

## 2017-02-20 NOTE — Telephone Encounter (Signed)
Called Andrea Miller - last attended 01/26/17. Left a message to check on her health and confirm her returning to [facility].

## 2017-02-28 ENCOUNTER — Telehealth: Payer: Self-pay | Admitting: Pharmacy Technician

## 2017-02-28 NOTE — Telephone Encounter (Signed)
Spoke with [employer].  Patient has pharmacy coverage with [employer].  No longer eligibile to receive medication assistance from [facility].  Sherilyn Dacosta Care Manager [facility]

## 2017-03-06 ENCOUNTER — Other Ambulatory Visit: Payer: Self-pay | Admitting: Family Medicine

## 2017-03-06 DIAGNOSIS — R112 Nausea with vomiting, unspecified: Secondary | ICD-10-CM

## 2017-03-13 ENCOUNTER — Telehealth: Payer: Self-pay | Admitting: Family Medicine

## 2017-03-14 ENCOUNTER — Ambulatory Visit: Payer: BLUE CROSS/BLUE SHIELD

## 2017-03-16 ENCOUNTER — Emergency Department (HOSPITAL_COMMUNITY)
Admission: EM | Admit: 2017-03-16 | Discharge: 2017-03-17 | Disposition: A | Discharge status: Home / Self Care | Payer: BLUE CROSS/BLUE SHIELD | Attending: Emergency Medicine | Admitting: Emergency Medicine

## 2017-03-16 ENCOUNTER — Encounter (HOSPITAL_COMMUNITY): Payer: Self-pay | Admitting: Emergency Medicine

## 2017-03-16 ENCOUNTER — Emergency Department (HOSPITAL_COMMUNITY): Payer: BLUE CROSS/BLUE SHIELD

## 2017-03-16 DIAGNOSIS — Y939 Activity, unspecified: Secondary | ICD-10-CM | POA: Insufficient documentation

## 2017-03-16 DIAGNOSIS — S39012A Strain of muscle, fascia and tendon of lower back, initial encounter: Secondary | ICD-10-CM | POA: Diagnosis not present

## 2017-03-16 DIAGNOSIS — J45909 Unspecified asthma, uncomplicated: Secondary | ICD-10-CM | POA: Insufficient documentation

## 2017-03-16 DIAGNOSIS — S8001XA Contusion of right knee, initial encounter: Secondary | ICD-10-CM

## 2017-03-16 DIAGNOSIS — I10 Essential (primary) hypertension: Secondary | ICD-10-CM | POA: Insufficient documentation

## 2017-03-16 DIAGNOSIS — S40011A Contusion of right shoulder, initial encounter: Secondary | ICD-10-CM | POA: Diagnosis not present

## 2017-03-16 DIAGNOSIS — S199XXA Unspecified injury of neck, initial encounter: Secondary | ICD-10-CM | POA: Diagnosis present

## 2017-03-16 DIAGNOSIS — Y999 Unspecified external cause status: Secondary | ICD-10-CM | POA: Diagnosis not present

## 2017-03-16 DIAGNOSIS — I2510 Atherosclerotic heart disease of native coronary artery without angina pectoris: Secondary | ICD-10-CM | POA: Diagnosis not present

## 2017-03-16 DIAGNOSIS — Y92410 Unspecified street and highway as the place of occurrence of the external cause: Secondary | ICD-10-CM | POA: Insufficient documentation

## 2017-03-16 DIAGNOSIS — S161XXA Strain of muscle, fascia and tendon at neck level, initial encounter: Secondary | ICD-10-CM | POA: Diagnosis not present

## 2017-03-16 LAB — PREGNANCY, URINE: Preg Test, Ur: NEGATIVE

## 2017-03-16 MED ORDER — LORAZEPAM 2 MG/ML IJ SOLN
INTRAMUSCULAR | Status: AC
  Administered 2017-03-16: 2 mg
  Filled 2017-03-16: qty 1

## 2017-03-16 MED ORDER — ONDANSETRON 4 MG PO TBDP
4.0000 mg | ORAL_TABLET | Freq: Once | ORAL | Status: AC
  Administered 2017-03-16: 4 mg via ORAL
  Filled 2017-03-16: qty 1

## 2017-03-16 MED ORDER — OXYCODONE-ACETAMINOPHEN 5-325 MG PO TABS
ORAL_TABLET | ORAL | Status: AC
  Filled 2017-03-16: qty 1

## 2017-03-16 MED ORDER — OXYCODONE-ACETAMINOPHEN 5-325 MG PO TABS
1.0000 | ORAL_TABLET | ORAL | Status: DC | PRN
  Administered 2017-03-16: 1 via ORAL
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
## 2017-03-16 NOTE — ED Notes (Signed)
This RN received a request from Primary RN to assess patient after a witnessed fall by radiology staff.  This RN assessed patient's pain, skin to areas of pain, and mentation.  Patient alert and oriented but very anxious.  Patient hyperventilating and having difficulty following commands.  This RN received request from Primary RN to see patient in radiology due to possible seizure-like activity.  Patient alert and not jerking when this RN arrived.  Pt alert, but not speaking.  Will make eye contact with RN and able to comprehend.  Pt provided with IM injection of Ativan to right deltoid, okay'd by patient.  Patient then accompanied by this RN back to unit and placed back on the monitor.
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
## 2017-03-17 MED ORDER — HYDROCODONE-ACETAMINOPHEN 5-325 MG PO TABS: 2.0000 | ORAL_TABLET | ORAL | 0 refills | Status: DC | PRN

## 2017-03-17 MED ORDER — KETOROLAC TROMETHAMINE 30 MG/ML IJ SOLN: 30.0000 mg | Freq: Once | INTRAMUSCULAR | Status: DC
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
## 2017-03-17 NOTE — ED Notes (Signed)
Patient seen ambulating independently to restroom.  Gait steady, but walking with a limp.

## 2017-03-19 ENCOUNTER — Ambulatory Visit
Admission: RE | Admit: 2017-03-19 | Discharge: 2017-03-19 | Disposition: A | Discharge status: Home / Self Care | Payer: BLUE CROSS/BLUE SHIELD | Source: Ambulatory Visit | Attending: Family Medicine | Admitting: Family Medicine

## 2017-03-19 ENCOUNTER — Other Ambulatory Visit: Payer: Self-pay | Admitting: Family Medicine

## 2017-03-19 DIAGNOSIS — M25571 Pain in right ankle and joints of right foot: Secondary | ICD-10-CM | POA: Diagnosis not present

## 2017-03-19 DIAGNOSIS — R112 Nausea with vomiting, unspecified: Secondary | ICD-10-CM | POA: Diagnosis present

## 2017-03-20 ENCOUNTER — Emergency Department: Payer: BLUE CROSS/BLUE SHIELD

## 2017-03-20 ENCOUNTER — Other Ambulatory Visit: Payer: Self-pay

## 2017-03-20 ENCOUNTER — Emergency Department
Admission: EM | Admit: 2017-03-20 | Discharge: 2017-03-20 | Disposition: A | Discharge status: Home / Self Care | Payer: BLUE CROSS/BLUE SHIELD | Attending: Emergency Medicine | Admitting: Emergency Medicine

## 2017-03-20 ENCOUNTER — Encounter: Payer: Self-pay | Admitting: Medical Oncology

## 2017-03-20 DIAGNOSIS — I2510 Atherosclerotic heart disease of native coronary artery without angina pectoris: Secondary | ICD-10-CM | POA: Insufficient documentation

## 2017-03-20 DIAGNOSIS — R0602 Shortness of breath: Secondary | ICD-10-CM | POA: Diagnosis present

## 2017-03-20 DIAGNOSIS — J189 Pneumonia, unspecified organism: Secondary | ICD-10-CM

## 2017-03-20 DIAGNOSIS — I10 Essential (primary) hypertension: Secondary | ICD-10-CM | POA: Insufficient documentation

## 2017-03-20 DIAGNOSIS — J181 Lobar pneumonia, unspecified organism: Secondary | ICD-10-CM | POA: Insufficient documentation

## 2017-03-20 DIAGNOSIS — J45901 Unspecified asthma with (acute) exacerbation: Secondary | ICD-10-CM | POA: Diagnosis not present

## 2017-03-20 DIAGNOSIS — Z79899 Other long term (current) drug therapy: Secondary | ICD-10-CM | POA: Insufficient documentation

## 2017-03-20 HISTORY — DX: Unspecified asthma, uncomplicated: J45.909

## 2017-03-20 LAB — BASIC METABOLIC PANEL
Anion gap: 10 (ref 5–15)
BUN: 8 mg/dL (ref 6–20)
CO2: 26 mmol/L (ref 22–32)
Calcium: 9.4 mg/dL (ref 8.9–10.3)
Chloride: 101 mmol/L (ref 101–111)
Creatinine, Ser: 0.55 mg/dL (ref 0.44–1.00)
GFR calc Af Amer: >60 mL/min (ref >60)
GFR calc non Af Amer: >60 mL/min (ref >60)
Glucose, Bld: 149 mg/dL — ABNORMAL HIGH (ref 65–99)
Potassium: 3.7 mmol/L (ref 3.5–5.1)
Sodium: 137 mmol/L (ref 135–145)

## 2017-03-20 LAB — CBC
HCT: 36.9 % (ref 35.0–47.0)
Hemoglobin: 12.2 g/dL (ref 12.0–16.0)
MCH: 27.2 pg (ref 26.0–34.0)
MCHC: 33 g/dL (ref 32.0–36.0)
MCV: 82.2 fL (ref 80.0–100.0)
Platelets: 235 10*3/uL (ref 150–440)
RBC: 4.49 MIL/uL (ref 3.80–5.20)
RDW: 15.2 % — ABNORMAL HIGH (ref 11.5–14.5)
WBC: 17 10*3/uL — ABNORMAL HIGH (ref 3.6–11.0)

## 2017-03-20 LAB — POCT PREGNANCY, URINE: Preg Test, Ur: NEGATIVE

## 2017-03-20 LAB — TROPONIN I
Troponin I: <0.03 ng/mL (ref <0.03)
Troponin I: <0.03 ng/mL (ref <0.03)

## 2017-03-20 MED ORDER — ACETAMINOPHEN 500 MG PO TABS
1000.0000 mg | ORAL_TABLET | Freq: Once | ORAL | Status: AC
  Administered 2017-03-20: 1000 mg via ORAL

## 2017-03-20 MED ORDER — ONDANSETRON HCL 4 MG/2ML IJ SOLN
4.0000 mg | Freq: Once | INTRAMUSCULAR | Status: AC
  Administered 2017-03-20: 4 mg via INTRAVENOUS
  Filled 2017-03-20: qty 2

## 2017-03-20 MED ORDER — ACETAMINOPHEN 500 MG PO TABS
ORAL_TABLET | ORAL | Status: AC
Start: 2017-03-20 — End: 2017-03-20
  Administered 2017-03-20: 1000 mg via ORAL
  Filled 2017-03-20: qty 2

## 2017-03-20 MED ORDER — AZITHROMYCIN 250 MG PO TABS: ORAL_TABLET | ORAL | 0 refills | Status: DC

## 2017-03-20 MED ORDER — AZITHROMYCIN 500 MG PO TABS
500.0000 mg | ORAL_TABLET | Freq: Once | ORAL | Status: AC
  Administered 2017-03-20: 500 mg via ORAL
  Filled 2017-03-20: qty 1

## 2017-03-20 MED ORDER — ALBUTEROL SULFATE HFA 108 (90 BASE) MCG/ACT IN AERS: 2.0000 | INHALATION_SPRAY | Freq: Four times a day (QID) | RESPIRATORY_TRACT | 2 refills | Status: DC | PRN

## 2017-03-20 MED ORDER — IPRATROPIUM-ALBUTEROL 0.5-2.5 (3) MG/3ML IN SOLN
3.0000 mL | Freq: Once | RESPIRATORY_TRACT | Status: AC
  Administered 2017-03-20: 3 mL via RESPIRATORY_TRACT
  Filled 2017-03-20: qty 3

## 2017-03-20 MED ORDER — ONDANSETRON HCL 4 MG/2ML IJ SOLN
INTRAMUSCULAR | Status: AC
Start: 2017-03-20 — End: 2017-03-20
  Administered 2017-03-20: 4 mg via INTRAVENOUS
  Filled 2017-03-20: qty 2

## 2017-03-20 MED ORDER — IOPAMIDOL (ISOVUE-370) INJECTION 76%
100.0000 mL | Freq: Once | INTRAVENOUS | Status: AC | PRN
  Administered 2017-03-20: 100 mL via INTRAVENOUS

## 2017-03-20 MED ORDER — SODIUM CHLORIDE 0.9 % IV BOLUS (SEPSIS)
1000.0000 mL | Freq: Once | INTRAVENOUS | Status: AC
  Administered 2017-03-20: 1000 mL via INTRAVENOUS

## 2017-03-20 NOTE — ED Notes (Signed)
Ambulated patient on room air with pulse ox.  sats 94-96%. Pulse 110-120.  Patient tolerated well.  Patient c/o  Generalized fatigue and feeling short winded.  Dr. Don Perking aware and to bedside to evaluate patient.  Incentive spirometer given to patient with teaching.  Teach back given.  Patient pulling volumes of [phone] ml.  Set 1000 ml as goal.

## 2017-03-20 NOTE — ED Notes (Signed)
To xray via stretcher. AAOx3.  Skin warm and dry.  No SOB/ DOE

## 2017-03-20 NOTE — ED Notes (Signed)
Patient transported to CT 

## 2017-03-20 NOTE — ED Notes (Signed)
Erskine Squibb RN aware of patient to room 2.

## 2017-03-20 NOTE — Discharge Instructions (Signed)

## 2017-03-20 NOTE — ED Notes (Signed)
Esign wouldn't come up. Pt verbalized understanding and discharge instructions. Pt has no questions at this time.
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
## 2017-03-31 ENCOUNTER — Emergency Department
Admission: EM | Admit: 2017-03-31 | Discharge: 2017-03-31 | Disposition: A | Discharge status: Home / Self Care | Payer: BLUE CROSS/BLUE SHIELD | Attending: Emergency Medicine | Admitting: Emergency Medicine

## 2017-03-31 ENCOUNTER — Emergency Department: Payer: BLUE CROSS/BLUE SHIELD

## 2017-03-31 ENCOUNTER — Encounter: Payer: Self-pay | Admitting: Emergency Medicine

## 2017-03-31 DIAGNOSIS — R05 Cough: Secondary | ICD-10-CM | POA: Diagnosis not present

## 2017-03-31 DIAGNOSIS — R059 Cough, unspecified: Secondary | ICD-10-CM

## 2017-03-31 DIAGNOSIS — R0602 Shortness of breath: Secondary | ICD-10-CM | POA: Insufficient documentation

## 2017-03-31 DIAGNOSIS — R0789 Other chest pain: Secondary | ICD-10-CM | POA: Diagnosis not present

## 2017-03-31 DIAGNOSIS — R0981 Nasal congestion: Secondary | ICD-10-CM | POA: Diagnosis not present

## 2017-03-31 DIAGNOSIS — Z79899 Other long term (current) drug therapy: Secondary | ICD-10-CM | POA: Insufficient documentation

## 2017-03-31 LAB — BASIC METABOLIC PANEL WITH GFR
Anion gap: 5 (ref 5–15)
BUN: 7 mg/dL (ref 6–20)
CO2: 30 mmol/L (ref 22–32)
Calcium: 8.7 mg/dL — ABNORMAL LOW (ref 8.9–10.3)
Chloride: 101 mmol/L (ref 101–111)
Creatinine, Ser: 0.73 mg/dL (ref 0.44–1.00)
GFR calc Af Amer: >60 mL/min
GFR calc non Af Amer: >60 mL/min
Glucose, Bld: 129 mg/dL — ABNORMAL HIGH (ref 65–99)
Potassium: 3.6 mmol/L (ref 3.5–5.1)
Sodium: 136 mmol/L (ref 135–145)

## 2017-03-31 LAB — CBC
HCT: 36.2 % (ref 35.0–47.0)
Hemoglobin: 11.7 g/dL — ABNORMAL LOW (ref 12.0–16.0)
MCH: 26.9 pg (ref 26.0–34.0)
MCHC: 32.5 g/dL (ref 32.0–36.0)
MCV: 82.8 fL (ref 80.0–100.0)
Platelets: 213 10*3/uL (ref 150–440)
RBC: 4.37 MIL/uL (ref 3.80–5.20)
RDW: 15.7 % — ABNORMAL HIGH (ref 11.5–14.5)
WBC: 4.1 10*3/uL (ref 3.6–11.0)

## 2017-03-31 LAB — TROPONIN I: Troponin I: <0.03 ng/mL

## 2017-03-31 MED ORDER — HYDROCOD POLST-CPM POLST ER 10-8 MG/5ML PO SUER: 5.0000 mL | Freq: Once | ORAL | Status: DC

## 2017-03-31 MED ORDER — GUAIFENESIN-CODEINE 100-10 MG/5ML PO SOLN: 5.0000 mL | Freq: Four times a day (QID) | ORAL | 0 refills | Status: DC | PRN

## 2017-03-31 NOTE — ED Notes (Signed)
Pt verbalized understanding of discharge instructions. NAD at this time. 

## 2017-03-31 NOTE — ED Provider Notes (Signed)
[facility] Emergency Department Provider Note  Time seen: 10:13 AM  I have reviewed the triage vital signs and the nursing notes.   HISTORY  Chief Complaint Cough; Shortness of Breath; and Chest Pain    HPI Andrea Miller is a 31 y.o. female with a past medical history of bipolar, chest pain, fibromyalgia, hypertension, anxiety, presents to the emergency department with continued cough and congestion. Patient states she was seen 03/20/17 in the emergency department and diagnosed with right-sided pneumonia. At that time the patient had a CT scan showing a right-sided pneumonia and an elevated white blood cell count of 17,000. Patient states she finished her antibiotics but still had a cough so she saw her primary care doctor who put her on a course of Levaquin which she is currently taking. Patient states she continues to cough so she came to the emergency department for evaluation. He has the patient appears well, no distress. Normal vitals including normal heart rate, afebrile with a normal respiratory rate and 100% room air saturation. Patient continues to state chest discomfort especially with cough, states she feels the cough has continued without improvement along with sputum production.  Past Medical History:  Diagnosis Date  . Anxiety   . Asthma   . Bipolar disorder (HCC)   . Chest pain    and angina  . Depression   . Fibromyalgia   . Hypertension   . 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 Obesity   . Seizures (HCC)    secondary to anxiety  . SVT (supraventricular tachycardia) (HCC)    with hx of syncope; managed by [facility]    Patient Active Problem List   Diagnosis Date Noted  . Asthma without status asthmaticus 01/16/2017  . Bipolar affective (HCC) 01/16/2017  . Coronary artery disease 01/16/2017  . Fibromyalgia 04/27/2014  . OSA (obstructive sleep apnea) 04/27/2014  . Seizure-like activity (HCC) 04/27/2014   . Obesity 04/15/2014    Past Surgical History:  Procedure Laterality Date  . NO PAST SURGERIES    . TONSILLECTOMY AND ADENOIDECTOMY N/A 09/05/2016   Procedure: TONSILLECTOMY AND ADENOIDECTOMY;  Surgeon: Linus Salmons, MD;  Location: [facility] ORS;  Service: ENT;  Laterality: N/A;    Prior to Admission medications   Medication Sig Start Date End Date Taking? Authorizing Provider  albuterol (PROVENTIL HFA;VENTOLIN HFA) 108 (90 Base) MCG/ACT inhaler Inhale 2 puffs into the lungs every 6 (six) hours as needed for wheezing or shortness of breath. 03/20/17   Nita Sickle, MD  albuterol (PROVENTIL) (2.5 MG/3ML) 0.083% nebulizer solution Take 2.5 mg by nebulization every 4 (four) hours as needed for wheezing or shortness of breath.    [provider]  amphetamine-dextroamphetamine (ADDERALL XR) 15 MG 24 hr capsule Take 15 mg by mouth every morning.    [provider]  azithromycin (ZITHROMAX) 250 MG tablet Take 1 a day for 4 days 03/20/17   Don Perking, [place], MD  beclomethasone (QVAR) 40 MCG/ACT inhaler Inhale 2 puffs into the lungs 2 (two) times daily.    [provider]  BELSOMRA 20 MG TABS Take 20 mg by mouth at bedtime. 02/07/17   [provider]  benzonatate (TESSALON PERLES) 100 MG capsule Take 1 capsule (100 mg total) by mouth 3 (three) times daily as needed for cough. Patient not taking: Reported on 09/05/2016 08/10/16   Evangeline Dakin, PA-C  brompheniramine-pseudoephedrine-DM 30-2-10 MG/5ML syrup Take 5 mLs by mouth 4 (four) times daily  as needed. Patient not taking: Reported on 01/16/2017 12/22/16   Joni Reining, PA-C  Carbamazepine (EQUETRO) 300 MG CP12 Take by mouth every morning.    [provider]  Cariprazine HCl (VRAYLAR) 3 MG CAPS Take by mouth at bedtime.    [provider]  clonazePAM (KLONOPIN) 2 MG tablet Take 2 mg by mouth at bedtime as needed for anxiety.    [provider]  diltiazem (CARDIZEM CD) 180 MG 24  hr capsule Take 240 mg by mouth every morning.    [provider]  folic acid (FOLVITE) 1 MG tablet  12/15/16   [provider]  furosemide (LASIX) 20 MG tablet Take 20 mg by mouth every morning.    [provider]  gabapentin (NEURONTIN) 300 MG capsule Take 300 mg by mouth 3 (three) times daily.    [provider]  hydrochlorothiazide (HYDRODIURIL) 25 MG tablet Take 25 mg by mouth daily. 03/03/17   [provider]  HYDROcodone-acetaminophen (NORCO/VICODIN) 5-325 MG tablet Take 2 tablets by mouth every 4 (four) hours as needed. 03/17/17   Earley Favor, NP  ibuprofen (ADVIL,MOTRIN) 600 MG tablet Take 1 tablet (600 mg total) by mouth every 6 (six) hours as needed. Patient not taking: Reported on 03/20/2017 12/22/16   Joni Reining, PA-C  ibuprofen (ADVIL,MOTRIN) 800 MG tablet Take 1 tablet (800 mg total) by mouth every 8 (eight) hours as needed (pain). Patient not taking: Reported on 01/16/2017 11/09/16   Hagler, Jami L, PA-C  lidocaine (XYLOCAINE) 2 % solution Use as directed 5 mLs in the mouth or throat every 6 (six) hours as needed for mouth pain. Mixed with 5 ML's all Bromfed-DM for swish and swallow Patient not taking: Reported on 01/16/2017 08/30/16   Joni Reining, PA-C  metoprolol (LOPRESSOR) 100 MG tablet Take 200 mg by mouth every morning.  09/21/14   [provider]  orlistat (XENICAL) 120 MG capsule Take 120 mg by mouth 3 (three) times daily with meals.    [provider]  oxyCODONE-acetaminophen (ROXICET) 5-325 MG tablet Take 1-2 tablets by mouth every 4 (four) hours as needed for severe pain. Patient not taking: Reported on 01/16/2017 09/05/16   Linus Salmons, MD  pantoprazole (PROTONIX) 40 MG tablet Take 40 mg by mouth daily. 03/03/17   [provider]  potassium chloride (K-DUR) 10 MEQ tablet Take 10 mEq by mouth every morning.    [provider]  Prenatal Vit-Fe Fumarate-FA (PNV PRENATAL PLUS MULTIVITAMIN)  27-1 MG TABS  12/15/16   [provider]    Allergies  Allergen Reactions  . Cortizone-10 [Hydrocortisone] Shortness Of Breath and Swelling  . Garlic Anaphylaxis  . Penicillins     Has patient had a PCN reaction causing immediate rash, facial/tongue/throat swelling, SOB or lightheadedness with hypotension:Yes Has patient had a PCN reaction causing severe rash involving mucus membranes or skin necrosis:No Has patient had a PCN reaction that required hospitalization:No Has patient had a PCN reaction occurring within the last 10 years:Yes. If all of the above answers are "NO", then may proceed with Cephalosporin use.   . Corticosteroids Palpitations    Family History  Problem Relation Age of Onset  . Diabetes Mother   . Hypertension Mother   . Asthma Mother   . Diabetes Father   . Hypertension Father     Social History Social History  Substance Use Topics  . Smoking status: Never Smoker  . Smokeless tobacco: Never Used  . Alcohol use No  Review of Systems Constitutional: Negative for fever. Eyes: Negative for visual changes. YQM:VHQIONGE for congestion Cardiovascular: Positive for chest discomfort with cough Respiratory: Positive for shortness of breath, positive for cough. Occasional sputum production. Gastrointestinal: Negative for abdominal pain, vomiting and diarrhea. Genitourinary: Negative for dysuria. Musculoskeletal: Negative for back pain. Skin: Negative for rash. Neurological: Negative for headache All other ROS negative  ____________________________________________   PHYSICAL EXAM:  VITAL SIGNS: ED Triage Vitals  Enc Vitals Group     BP 03/31/17 0809 117/74     Pulse Rate 03/31/17 0809 90     Resp 03/31/17 0809 18     Temp 03/31/17 0809 98.3 F (36.8 C)     Temp Source 03/31/17 0809 Oral     SpO2 03/31/17 0809 100 %     Weight 03/31/17 0810 (!) 364 lb (165.1 kg)     Height 03/31/17 0810 5\' 10"  (1.778 m)     Head Circumference --       Peak Flow --      Pain Score 03/31/17 0809 10     Pain Loc --      Pain Edu? --      Excl. in GC? --     Constitutional: Alert and oriented. Well appearing and in no distress. Eyes: Normal exam ENT   Head: Normocephalic and atraumatic   Mouth/Throat: Mucous membranes are moist. Cardiovascular: Normal rate, regular rhythm. No murmur Respiratory: Normal respiratory effort without tachypnea nor retractions. Breath sounds are clear. No wheezes rales or rhonchi. Gastrointestinal: Soft and nontender. No distention.  Obese. Musculoskeletal: Nontender with normal range of motion in all extremities. No lower extremity tenderness Neurologic:  Normal speech and language. No gross focal neurologic deficits  Skin:  Skin is warm, dry and intact.  Psychiatric: Mood and affect are normal. ____________________________________________    EKG  EKG reviewed and interpreted by myself shows normal sinus rhythm at 88 bpm, narrow QRS, normal axis, normal intervals, no ST changes. Normal EKG.  ____________________________________________    RADIOLOGY  Chest x-ray negative  ____________________________________________   INITIAL IMPRESSION / ASSESSMENT AND PLAN / ED COURSE  Pertinent labs & imaging results that were available during my care of the patient were reviewed by me and considered in my medical decision making (see chart for details).  Patient presents to the emergency department for continued cough and congestion. Patient was diagnosed with right-sided pneumonia approximately 10 days ago. Patient's labs have improved considerably, normal white blood cell count currently. Afebrile with a normal O2 saturation respiratory rate and heart rate. Patient continues have an occasional cough. At this point I do not believe the patient will require any further antibiotics. Suspect resolving infection. We will place on cough medication to be used as needed. Patient will follow-up with her primary  care doctor.  ____________________________________________   FINAL CLINICAL IMPRESSION(S) / ED DIAGNOSES  Cough   Minna Antis, MD 03/31/17 1017

## 2017-03-31 NOTE — ED Triage Notes (Signed)
States cough, SOB and chest pain. Began 3 days ago, Recent pneumonia.

## 2017-03-31 NOTE — Discharge Instructions (Signed)
Please call your primary care doctor on Monday to arrange a follow-up appointment.

## 2017-03-31 NOTE — ED Notes (Signed)
Pt states recently diagnosed with pneumonia

## 2017-04-14 ENCOUNTER — Emergency Department: Payer: BLUE CROSS/BLUE SHIELD

## 2017-04-14 ENCOUNTER — Encounter: Payer: Self-pay | Admitting: Emergency Medicine

## 2017-04-14 ENCOUNTER — Observation Stay
Admission: EM | Admit: 2017-04-14 | Discharge: 2017-04-16 | Disposition: A | Discharge status: Home / Self Care | Payer: BLUE CROSS/BLUE SHIELD | Attending: Internal Medicine | Admitting: Internal Medicine

## 2017-04-14 DIAGNOSIS — M5416 Radiculopathy, lumbar region: Secondary | ICD-10-CM | POA: Diagnosis not present

## 2017-04-14 DIAGNOSIS — J209 Acute bronchitis, unspecified: Secondary | ICD-10-CM | POA: Diagnosis not present

## 2017-04-14 DIAGNOSIS — I10 Essential (primary) hypertension: Secondary | ICD-10-CM | POA: Diagnosis not present

## 2017-04-14 DIAGNOSIS — J45901 Unspecified asthma with (acute) exacerbation: Secondary | ICD-10-CM | POA: Diagnosis not present

## 2017-04-14 DIAGNOSIS — F319 Bipolar disorder, unspecified: Secondary | ICD-10-CM | POA: Diagnosis not present

## 2017-04-14 DIAGNOSIS — G4733 Obstructive sleep apnea (adult) (pediatric): Secondary | ICD-10-CM | POA: Insufficient documentation

## 2017-04-14 DIAGNOSIS — F419 Anxiety disorder, unspecified: Secondary | ICD-10-CM | POA: Diagnosis not present

## 2017-04-14 DIAGNOSIS — F909 Attention-deficit hyperactivity disorder, unspecified type: Secondary | ICD-10-CM | POA: Insufficient documentation

## 2017-04-14 DIAGNOSIS — R42 Dizziness and giddiness: Secondary | ICD-10-CM | POA: Insufficient documentation

## 2017-04-14 DIAGNOSIS — M797 Fibromyalgia: Secondary | ICD-10-CM | POA: Diagnosis not present

## 2017-04-14 DIAGNOSIS — Z9119 Patient's noncompliance with other medical treatment and regimen: Secondary | ICD-10-CM | POA: Diagnosis not present

## 2017-04-14 DIAGNOSIS — Z880 Allergy status to penicillin: Secondary | ICD-10-CM | POA: Diagnosis not present

## 2017-04-14 LAB — BASIC METABOLIC PANEL
Anion gap: 8 (ref 5–15)
BUN: 7 mg/dL (ref 6–20)
CO2: 26 mmol/L (ref 22–32)
Calcium: 9.3 mg/dL (ref 8.9–10.3)
Chloride: 107 mmol/L (ref 101–111)
Creatinine, Ser: 0.7 mg/dL (ref 0.44–1.00)
GFR calc Af Amer: >60 mL/min (ref >60)
GFR calc non Af Amer: >60 mL/min (ref >60)
Glucose, Bld: 161 mg/dL — ABNORMAL HIGH (ref 65–99)
Potassium: 3.5 mmol/L (ref 3.5–5.1)
Sodium: 141 mmol/L (ref 135–145)

## 2017-04-14 LAB — CBC
HCT: 37.3 % (ref 35.0–47.0)
Hemoglobin: 12.3 g/dL (ref 12.0–16.0)
MCH: 27.1 pg (ref 26.0–34.0)
MCHC: 33.1 g/dL (ref 32.0–36.0)
MCV: 81.9 fL (ref 80.0–100.0)
Platelets: 254 10*3/uL (ref 150–440)
RBC: 4.55 MIL/uL (ref 3.80–5.20)
RDW: 15 % — ABNORMAL HIGH (ref 11.5–14.5)
WBC: 9.1 10*3/uL (ref 3.6–11.0)

## 2017-04-14 LAB — TROPONIN I
Troponin I: <0.03 ng/mL (ref <0.03)
Troponin I: <0.03 ng/mL (ref <0.03)

## 2017-04-14 MED ORDER — ONDANSETRON HCL 4 MG/2ML IJ SOLN
4.0000 mg | Freq: Four times a day (QID) | INTRAMUSCULAR | Status: DC | PRN
  Administered 2017-04-14 – 2017-04-15 (×2): 4 mg via INTRAVENOUS
  Filled 2017-04-14 (×2): qty 2

## 2017-04-14 MED ORDER — FUROSEMIDE 20 MG PO TABS
20.0000 mg | ORAL_TABLET | ORAL | Status: DC
  Administered 2017-04-15 – 2017-04-16 (×2): 20 mg via ORAL
  Filled 2017-04-14 (×2): qty 1

## 2017-04-14 MED ORDER — METOPROLOL SUCCINATE ER 50 MG PO TB24
100.0000 mg | ORAL_TABLET | Freq: Two times a day (BID) | ORAL | Status: DC
  Administered 2017-04-14 – 2017-04-16 (×4): 100 mg via ORAL
  Filled 2017-04-14 (×4): qty 2

## 2017-04-14 MED ORDER — METHYLPREDNISOLONE SODIUM SUCC 125 MG IJ SOLR
60.0000 mg | Freq: Once | INTRAMUSCULAR | Status: AC
  Administered 2017-04-14: 60 mg via INTRAVENOUS
  Filled 2017-04-14: qty 2

## 2017-04-14 MED ORDER — CARIPRAZINE HCL 3 MG PO CAPS: 1.0000 | ORAL_CAPSULE | Freq: Every day | ORAL | Status: DC

## 2017-04-14 MED ORDER — GABAPENTIN 300 MG PO CAPS
300.0000 mg | ORAL_CAPSULE | Freq: Three times a day (TID) | ORAL | Status: DC
  Administered 2017-04-14 – 2017-04-16 (×5): 300 mg via ORAL
  Filled 2017-04-14 (×5): qty 1

## 2017-04-14 MED ORDER — FOLIC ACID 1 MG PO TABS
1.0000 mg | ORAL_TABLET | Freq: Every day | ORAL | Status: DC
  Administered 2017-04-15 – 2017-04-16 (×2): 1 mg via ORAL
  Filled 2017-04-14 (×2): qty 1

## 2017-04-14 MED ORDER — AMPHETAMINE-DEXTROAMPHET ER 5 MG PO CP24
15.0000 mg | ORAL_CAPSULE | ORAL | Status: DC
  Administered 2017-04-15 – 2017-04-16 (×2): 15 mg via ORAL
  Filled 2017-04-14 (×2): qty 3

## 2017-04-14 MED ORDER — IBUPROFEN 600 MG PO TABS
600.0000 mg | ORAL_TABLET | Freq: Four times a day (QID) | ORAL | Status: DC | PRN
Start: 2017-04-14 — End: 2017-04-16

## 2017-04-14 MED ORDER — HYDROCODONE-ACETAMINOPHEN 5-325 MG PO TABS
2.0000 | ORAL_TABLET | ORAL | Status: DC | PRN
  Administered 2017-04-14 – 2017-04-16 (×5): 2 via ORAL
  Filled 2017-04-14 (×6): qty 2

## 2017-04-14 MED ORDER — BUDESONIDE 0.5 MG/2ML IN SUSP
2.0000 mL | Freq: Two times a day (BID) | RESPIRATORY_TRACT | Status: DC
  Administered 2017-04-15 – 2017-04-16 (×3): 0.5 mg via RESPIRATORY_TRACT
  Filled 2017-04-14 (×3): qty 2

## 2017-04-14 MED ORDER — KETOROLAC TROMETHAMINE 30 MG/ML IJ SOLN: 15.0000 mg | Freq: Once | INTRAMUSCULAR | Status: DC

## 2017-04-14 MED ORDER — ORLISTAT 120 MG PO CAPS: 120.0000 mg | ORAL_CAPSULE | Freq: Three times a day (TID) | ORAL | Status: DC

## 2017-04-14 MED ORDER — SUVOREXANT 20 MG PO TABS
20.0000 mg | ORAL_TABLET | Freq: Every day | ORAL | Status: DC
Start: 2017-04-14 — End: 2017-04-16

## 2017-04-14 MED ORDER — METHYLPREDNISOLONE SODIUM SUCC 125 MG IJ SOLR
60.0000 mg | Freq: Four times a day (QID) | INTRAMUSCULAR | Status: DC
  Administered 2017-04-14 – 2017-04-15 (×2): 60 mg via INTRAVENOUS
  Filled 2017-04-14 (×2): qty 2

## 2017-04-14 MED ORDER — IPRATROPIUM-ALBUTEROL 0.5-2.5 (3) MG/3ML IN SOLN
3.0000 mL | Freq: Once | RESPIRATORY_TRACT | Status: AC
  Administered 2017-04-14: 3 mL via RESPIRATORY_TRACT
  Filled 2017-04-14: qty 3

## 2017-04-14 MED ORDER — KETOROLAC TROMETHAMINE 30 MG/ML IJ SOLN
15.0000 mg | Freq: Once | INTRAMUSCULAR | Status: AC
  Administered 2017-04-14: 15 mg via INTRAVENOUS
  Filled 2017-04-14: qty 1

## 2017-04-14 MED ORDER — IBUPROFEN 400 MG PO TABS: 800.0000 mg | ORAL_TABLET | Freq: Three times a day (TID) | ORAL | Status: DC | PRN

## 2017-04-14 MED ORDER — SODIUM CHLORIDE 0.9 % IV BOLUS (SEPSIS)
1000.0000 mL | Freq: Once | INTRAVENOUS | Status: AC
  Administered 2017-04-14: 1000 mL via INTRAVENOUS

## 2017-04-14 MED ORDER — METHYLPREDNISOLONE SODIUM SUCC 125 MG IJ SOLR: 60.0000 mg | Freq: Four times a day (QID) | INTRAMUSCULAR | Status: DC

## 2017-04-14 MED ORDER — POTASSIUM CHLORIDE 20 MEQ PO PACK
10.0000 meq | PACK | ORAL | Status: DC
  Administered 2017-04-15: 11:00:00 10 meq via ORAL
  Filled 2017-04-14: qty 1

## 2017-04-14 MED ORDER — PNEUMOCOCCAL VAC POLYVALENT 25 MCG/0.5ML IJ INJ
0.5000 mL | INJECTION | INTRAMUSCULAR | Status: AC
  Administered 2017-04-15: 11:00:00 0.5 mL via INTRAMUSCULAR
  Filled 2017-04-14: qty 0.5

## 2017-04-14 MED ORDER — IPRATROPIUM-ALBUTEROL 0.5-2.5 (3) MG/3ML IN SOLN
3.0000 mL | Freq: Four times a day (QID) | RESPIRATORY_TRACT | Status: DC
  Administered 2017-04-14 – 2017-04-15 (×3): 3 mL via RESPIRATORY_TRACT
  Filled 2017-04-14 (×3): qty 3

## 2017-04-14 MED ORDER — ENOXAPARIN SODIUM 40 MG/0.4ML ~~LOC~~ SOLN
40.0000 mg | Freq: Two times a day (BID) | SUBCUTANEOUS | Status: DC
  Administered 2017-04-14 – 2017-04-16 (×4): 40 mg via SUBCUTANEOUS
  Filled 2017-04-14 (×4): qty 0.4

## 2017-04-14 MED ORDER — AZITHROMYCIN 500 MG PO TABS
ORAL_TABLET | ORAL | Status: AC
  Administered 2017-04-14: 500 mg via ORAL
  Filled 2017-04-14: qty 1

## 2017-04-14 MED ORDER — CARBAMAZEPINE 200 MG PO TABS
200.0000 mg | ORAL_TABLET | Freq: Every day | ORAL | Status: DC
  Administered 2017-04-15 – 2017-04-16 (×2): 200 mg via ORAL
  Filled 2017-04-14 (×2): qty 1

## 2017-04-14 MED ORDER — AZITHROMYCIN 500 MG PO TABS
500.0000 mg | ORAL_TABLET | Freq: Every day | ORAL | Status: DC
  Administered 2017-04-14 – 2017-04-15 (×2): 500 mg via ORAL
  Filled 2017-04-14: qty 1

## 2017-04-14 MED ORDER — GUAIFENESIN ER 600 MG PO TB12
600.0000 mg | ORAL_TABLET | Freq: Two times a day (BID) | ORAL | Status: DC
  Administered 2017-04-14 – 2017-04-16 (×4): 600 mg via ORAL
  Filled 2017-04-14 (×4): qty 1

## 2017-04-14 MED ORDER — CLONAZEPAM 1 MG PO TABS
2.0000 mg | ORAL_TABLET | Freq: Every day | ORAL | Status: DC
  Administered 2017-04-14 – 2017-04-15 (×2): 2 mg via ORAL
  Filled 2017-04-14 (×2): qty 2

## 2017-04-14 MED ORDER — DILTIAZEM HCL ER COATED BEADS 180 MG PO CP24
360.0000 mg | ORAL_CAPSULE | Freq: Every day | ORAL | Status: DC
  Administered 2017-04-15 – 2017-04-16 (×2): 360 mg via ORAL
  Filled 2017-04-14 (×2): qty 2

## 2017-04-14 MED ORDER — GUAIFENESIN-CODEINE 100-10 MG/5ML PO SOLN
5.0000 mL | Freq: Four times a day (QID) | ORAL | Status: DC | PRN
  Administered 2017-04-15: 05:00:00 5 mL via ORAL
  Filled 2017-04-14: qty 5

## 2017-04-14 MED ORDER — TIZANIDINE HCL 4 MG PO TABS
4.0000 mg | ORAL_TABLET | Freq: Three times a day (TID) | ORAL | Status: DC | PRN
  Administered 2017-04-14 – 2017-04-15 (×2): 4 mg via ORAL
  Filled 2017-04-14 (×3): qty 1

## 2017-04-14 MED ORDER — ONDANSETRON HCL 4 MG PO TABS
4.0000 mg | ORAL_TABLET | Freq: Four times a day (QID) | ORAL | Status: DC | PRN
  Administered 2017-04-15: 4 mg via ORAL
  Filled 2017-04-14: qty 1

## 2017-04-14 MED ORDER — HYDROCHLOROTHIAZIDE 25 MG PO TABS
25.0000 mg | ORAL_TABLET | Freq: Every day | ORAL | Status: DC
  Administered 2017-04-15 – 2017-04-16 (×2): 25 mg via ORAL
  Filled 2017-04-14 (×2): qty 1

## 2017-04-14 NOTE — ED Provider Notes (Signed)
[facility] Emergency Department Provider Note    First MD Initiated Contact with Patient 04/14/17 1511     (approximate)  I have reviewed the triage vital signs and the nursing notes.   HISTORY  Chief Complaint Shortness of Breath; Chest Pain; and Pneumonia    HPI Andrea Miller is a 31 y.o. female with a history of anxiety, bipolar disorder, depression, fibromyalgia, obesity and asthma presents with recurrent shortness of breath that started this morning. States that the shortness of breath was associated with lightheadedness and weakness particularly when ambulating. States that she has been having a productive cough but no fevers. Patient was recently diagnosed with pneumonia and treated with Levaquin. Just finished her steroids from most recent visit yesterday. Denies any chest pain. Has had some nausea with the shortness of breath. No pain when taking a deep breath.  She did have a CT imaging performed 1 month ago for similar presentation which showed no evidence of pulmonary embolism but did show the pneumonia for which she was treated.  Since then she has not noted any lower extremity swelling or weakness.   Past Medical History:  Diagnosis Date  . Anxiety   . Asthma   . Bipolar disorder (HCC)   . Chest pain    and angina  . Depression   . Fibromyalgia   . Hypertension   . 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 Obesity   . Seizures (HCC)    secondary to anxiety  . SVT (supraventricular tachycardia) (HCC)    with hx of syncope; managed by [facility]   Family History  Problem Relation Age of Onset  . Diabetes Mother   . Hypertension Mother   . Asthma Mother   . Diabetes Father   . Hypertension Father    Past Surgical History:  Procedure Laterality Date  . NO PAST SURGERIES    . TONSILLECTOMY AND ADENOIDECTOMY N/A 09/05/2016   Procedure: TONSILLECTOMY AND ADENOIDECTOMY;  Surgeon: Linus Salmons, MD;   Location: [facility] ORS;  Service: ENT;  Laterality: N/A;   Patient Active Problem List   Diagnosis Date Noted  . Asthma without status asthmaticus 01/16/2017  . Bipolar affective (HCC) 01/16/2017  . Coronary artery disease 01/16/2017  . Fibromyalgia 04/27/2014  . OSA (obstructive sleep apnea) 04/27/2014  . Seizure-like activity (HCC) 04/27/2014  . Obesity 04/15/2014      Prior to Admission medications   Medication Sig Start Date End Date Taking? Authorizing Provider  albuterol (PROVENTIL HFA;VENTOLIN HFA) 108 (90 Base) MCG/ACT inhaler Inhale 2 puffs into the lungs every 6 (six) hours as needed for wheezing or shortness of breath. 03/20/17   Nita Sickle, MD  albuterol (PROVENTIL) (2.5 MG/3ML) 0.083% nebulizer solution Take 2.5 mg by nebulization every 4 (four) hours as needed for wheezing or shortness of breath.    [provider]  amphetamine-dextroamphetamine (ADDERALL XR) 15 MG 24 hr capsule Take 15 mg by mouth every morning.    [provider]  azithromycin (ZITHROMAX) 250 MG tablet Take 1 a day for 4 days 03/20/17   Don Perking, [place], MD  beclomethasone (QVAR) 40 MCG/ACT inhaler Inhale 2 puffs into the lungs 2 (two) times daily.    [provider]  BELSOMRA 20 MG TABS Take 20 mg by mouth at bedtime. 02/07/17   [provider]  benzonatate (TESSALON PERLES) 100 MG capsule Take 1 capsule (100 mg total) by mouth 3 (three) times daily as needed for  cough. Patient not taking: Reported on 09/05/2016 08/10/16   Evangeline Dakin, PA-C  brompheniramine-pseudoephedrine-DM 30-2-10 MG/5ML syrup Take 5 mLs by mouth 4 (four) times daily as needed. Patient not taking: Reported on 01/16/2017 12/22/16   Joni Reining, PA-C  Carbamazepine (EQUETRO) 300 MG CP12 Take by mouth every morning.    [provider]  Cariprazine HCl (VRAYLAR) 3 MG CAPS Take by mouth at bedtime.    [provider]  clonazePAM (KLONOPIN) 2 MG tablet Take 2 mg by mouth at  bedtime as needed for anxiety.    [provider]  diltiazem (CARDIZEM CD) 180 MG 24 hr capsule Take 240 mg by mouth every morning.    [provider]  folic acid (FOLVITE) 1 MG tablet  12/15/16   [provider]  furosemide (LASIX) 20 MG tablet Take 20 mg by mouth every morning.    [provider]  gabapentin (NEURONTIN) 300 MG capsule Take 300 mg by mouth 3 (three) times daily.    [provider]  guaiFENesin-codeine 100-10 MG/5ML syrup Take 5 mLs by mouth every 6 (six) hours as needed for cough. 03/31/17   Minna Antis, MD  hydrochlorothiazide (HYDRODIURIL) 25 MG tablet Take 25 mg by mouth daily. 03/03/17   [provider]  HYDROcodone-acetaminophen (NORCO/VICODIN) 5-325 MG tablet Take 2 tablets by mouth every 4 (four) hours as needed. 03/17/17   Earley Favor, NP  ibuprofen (ADVIL,MOTRIN) 600 MG tablet Take 1 tablet (600 mg total) by mouth every 6 (six) hours as needed. Patient not taking: Reported on 03/20/2017 12/22/16   Joni Reining, PA-C  ibuprofen (ADVIL,MOTRIN) 800 MG tablet Take 1 tablet (800 mg total) by mouth every 8 (eight) hours as needed (pain). Patient not taking: Reported on 01/16/2017 11/09/16   Hagler, Jami L, PA-C  lidocaine (XYLOCAINE) 2 % solution Use as directed 5 mLs in the mouth or throat every 6 (six) hours as needed for mouth pain. Mixed with 5 ML's all Bromfed-DM for swish and swallow Patient not taking: Reported on 01/16/2017 08/30/16   Joni Reining, PA-C  metoprolol (LOPRESSOR) 100 MG tablet Take 200 mg by mouth every morning.  09/21/14   [provider]  orlistat (XENICAL) 120 MG capsule Take 120 mg by mouth 3 (three) times daily with meals.    [provider]  oxyCODONE-acetaminophen (ROXICET) 5-325 MG tablet Take 1-2 tablets by mouth every 4 (four) hours as needed for severe pain. Patient not taking: Reported on 01/16/2017 09/05/16   Linus Salmons, MD  pantoprazole (PROTONIX) 40 MG tablet  Take 40 mg by mouth daily. 03/03/17   [provider]  potassium chloride (K-DUR) 10 MEQ tablet Take 10 mEq by mouth every morning.    [provider]  Prenatal Vit-Fe Fumarate-FA (PNV PRENATAL PLUS MULTIVITAMIN) 27-1 MG TABS  12/15/16   [provider]    Allergies Cortizone-10 [hydrocortisone]; Garlic; Penicillins; and Corticosteroids    Social History Social History  Substance Use Topics  . Smoking status: Never Smoker  . Smokeless tobacco: Never Used  . Alcohol use No    Review of Systems Patient denies headaches, rhinorrhea, blurry vision, numbness, shortness of breath, chest pain, edema, cough, abdominal pain, nausea, vomiting, diarrhea, dysuria, fevers, rashes or hallucinations unless otherwise stated above in HPI. ____________________________________________   PHYSICAL EXAM:  VITAL SIGNS: Vitals:   04/14/17 1311  BP: 134/83  Pulse: (!) 104  Resp: 20  Temp: 98.2 F (36.8 C)    Constitutional: Alert and oriented.  in no acute distress. Eyes: Conjunctivae are normal. PERRL. EOMI. Head: Atraumatic. Nose: No congestion/rhinnorhea. Mouth/Throat: Mucous membranes are moist.  Oropharynx non-erythematous. Neck: No stridor. Painless ROM. No cervical spine tenderness to palpation Hematological/Lymphatic/Immunilogical: No cervical lymphadenopathy. Cardiovascular: Normal rate, regular rhythm. Grossly normal heart sounds.  Good peripheral circulation. Respiratory: Normal respiratory effort.  No retractions. With end expiratory wheezes throughout Gastrointestinal: Soft and nontender. No distention. No abdominal bruits. No CVA tenderness. Musculoskeletal: No lower extremity tenderness nor edema.  No joint effusions. Neurologic:  Normal speech and language. No gross focal neurologic deficits are appreciated. No gait instability. Skin:  Skin is warm, dry and intact. No rash noted. Psychiatric: Mood and affect are normal. Speech and behavior are  normal.  ____________________________________________   LABS (all labs ordered are listed, but only abnormal results are displayed)  Results for orders placed or performed during the hospital encounter of 04/14/17 (from the past 24 hour(s))  Basic metabolic panel     Status: Abnormal   Collection Time: 04/14/17  1:13 PM  Result Value Ref Range   Sodium 141 135 - 145 mmol/L   Potassium 3.5 3.5 - 5.1 mmol/L   Chloride 107 101 - 111 mmol/L   CO2 26 22 - 32 mmol/L   Glucose, Bld 161 (H) 65 - 99 mg/dL   BUN 7 6 - 20 mg/dL   Creatinine, Ser 1.61 0.44 - 1.00 mg/dL   Calcium 9.3 8.9 - 09.6 mg/dL   GFR calc non Af Amer >60 >60 mL/min   GFR calc Af Amer >60 >60 mL/min   Anion gap 8 5 - 15  CBC     Status: Abnormal   Collection Time: 04/14/17  1:13 PM  Result Value Ref Range   WBC 9.1 3.6 - 11.0 K/uL   RBC 4.55 3.80 - 5.20 MIL/uL   Hemoglobin 12.3 12.0 - 16.0 g/dL   HCT 04.5 40.9 - 81.1 %   MCV 81.9 80.0 - 100.0 fL   MCH 27.1 26.0 - 34.0 pg   MCHC 33.1 32.0 - 36.0 g/dL   RDW 91.4 (H) 78.2 - 95.6 %   Platelets 254 150 - 440 K/uL  Troponin I     Status: None   Collection Time: 04/14/17  1:13 PM  Result Value Ref Range   Troponin I <0.03 <0.03 ng/mL   ____________________________________________  EKG My review and personal interpretation at Time: 13:19   Indication: sob  Rate: 110  Rhythm: sinus Axis: normal Other: non specific T wave changes, no STEMI ____________________________________________  RADIOLOGY  I personally reviewed all radiographic images ordered to evaluate for the above acute complaints and reviewed radiology reports and findings.  These findings were personally discussed with the patient.  Please see medical record for radiology report.  ____________________________________________   PROCEDURES  Procedure(s) performed:  Procedures    Critical Care performed: no ____________________________________________   INITIAL IMPRESSION / ASSESSMENT AND PLAN  / ED COURSE  Pertinent labs & imaging results that were available during my care of the patient were reviewed by me and considered in my medical decision making (see chart for details).  DDX: Asthma, copd, CHF, pna, ptx, malignancy, Pe, anemia   Colby Catanese Savarino is a 31 y.o. who presents to the ED with Shortness of breath and symptoms as described above. She arrives afebrile mildly tachycardic but in no acute distress. Physical diminished breath sounds but is increased work of breathing. Chest x-ray shows no consolidation or evidence of infiltrate. Her white count has continued  to improve. Does have intermittent cough. His neck is still from resolving infection. Does not have any hypoxia or pleuritic chest pain to suggest pulmonary embolism. At this point I do not feel that it is in the patient's best interest to repeat CT imaging as I have a very low pretest probability for pulmonary embolism at this time. I plan to give patient additional IV steroids including nebulizer treatments for asthma exacerbation.The patient will be placed on continuous pulse oximetry and telemetry for monitoring.  Laboratory evaluation will be sent to evaluate for the above complaints.       Clinical Course as of Apr 15 1815  Sat Apr 14, 2017  1702 During ambulation trial the patient became tachycardic to the 160s and was unable to ambulate more than a few steps without feeling lightheaded and she was about to pass out. Based on her failure to improve with breathing treatments and multiple visits to the ER over the past month to feel patient will benefit from observation in the hospital overnight for IV fluids and further evaluation.   [PR]    Clinical Course User Index [PR] Willy Eddy, MD     ____________________________________________   FINAL CLINICAL IMPRESSION(S) / ED DIAGNOSES  Final diagnoses:  Moderate asthma with exacerbation, unspecified whether persistent      NEW MEDICATIONS STARTED  DURING THIS VISIT:  New Prescriptions   No medications on file     Note:  This document was prepared using Dragon voice recognition software and may include unintentional dictation errors.    Willy Eddy, MD 04/14/17 [phone]

## 2017-04-14 NOTE — ED Triage Notes (Addendum)
Pt c/o cough, shortness of breath, and chest pain. Pt states was treated for pneumonia a few weeks ago, states feels like her symptoms have no resolved. Pt presents via [facility], per EMS RA sat 88%, on arrival to ED O2 saturation 100% on RA. EMS reports that per patient last asthma attack was this AM, 4mg  IV zofran given en route. Pt states pain is worse with coughing at this time.

## 2017-04-14 NOTE — ED Notes (Signed)
Pt ambulated in hallway with cont pulse ox. O2 sat remained within 97-100%, however pt c/o dizziness after 15 steps with HR noted to be in 160s. Pt seated in wheelchair in hallway and transported back to room. EDP notified. VS after 1 min at rest 100% on RA, HR 98.

## 2017-04-14 NOTE — H&P (Signed)
[facility] [place] [facility]   PATIENT NAME: Andrea Miller    MR#:  [id number]  DATE OF BIRTH:  03/01/1986  DATE OF ADMISSION:  04/14/2017  PRIMARY CARE PHYSICIAN: Emogene Morgan, MD   REQUESTING/REFERRING PHYSICIAN: Dr. Elease Etienne MD CHIEF COMPLAINT:   Chief Complaint  Patient presents with  . Shortness of Breath  . Chest Pain  . Pneumonia    HISTORY OF PRESENT ILLNESS: Andrea Miller  is a 31 y.o. female with a known history ofAnxiety, asthma, bipolar disorder, chest pain, depression, fibromyalgia, essential hypertension, history of SVT was presented to the emergency room with multiple complaints. She reports that she's been short of breath since yesterday. Wheezing coughing. She has recently been treated with outpatient antibiotics for pneumonia. She reports that she continues to have cough. And has had wheezing and shortness of breath. Patient also complains of feeling dizzy and having episodes of passing out. She also reports that she has history of this. She states that she has had a history of having elevated heart rate since her tonsillectomy surgery. She complains of low energy and fever. Denies any chest pains no nausea vomiting or diarrhea.   PAST MEDICAL HISTORY:   Past Medical History:  Diagnosis Date  . Anxiety   . Asthma   . Bipolar disorder (HCC)   . Chest pain    and angina  . Depression   . Fibromyalgia   . Hypertension   . 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 Obesity   . Seizures (HCC)    secondary to anxiety  . SVT (supraventricular tachycardia) (HCC)    with hx of syncope; managed by [facility]    PAST SURGICAL HISTORY: Past Surgical History:  Procedure Laterality Date  . NO PAST SURGERIES    . TONSILLECTOMY AND ADENOIDECTOMY N/A 09/05/2016   Procedure: TONSILLECTOMY AND ADENOIDECTOMY;  Surgeon: Linus Salmons, MD;  Location: [facility] ORS;  Service: ENT;  Laterality: N/A;    SOCIAL HISTORY:   Social History  Substance Use Topics  . Smoking status: Never Smoker  . Smokeless tobacco: Never Used  . Alcohol use No    FAMILY HISTORY:  Family History  Problem Relation Age of Onset  . Diabetes Mother   . Hypertension Mother   . Asthma Mother   . Diabetes Father   . Hypertension Father     DRUG ALLERGIES:  Allergies  Allergen Reactions  . Cortizone-10 [Hydrocortisone] Shortness Of Breath and Swelling  . Garlic Anaphylaxis  . Penicillins     Has patient had a PCN reaction causing immediate rash, facial/tongue/throat swelling, SOB or lightheadedness with hypotension:Yes Has patient had a PCN reaction causing severe rash involving mucus membranes or skin necrosis:No Has patient had a PCN reaction that required hospitalization:No Has patient had a PCN reaction occurring within the last 10 years:Yes. If all of the above answers are "NO", then may proceed with Cephalosporin use.   . Corticosteroids Palpitations    REVIEW OF SYSTEMS:   CONSTITUTIONAL: No fever,Positive  Fatigue and weakness.   EYES: No blurred or double vision.  EARS, NOSE, AND THROAT: No tinnitus or ear pain.  RESPIRATORY: + cough,+ shortness of breath, + wheezing or hemoptysis.  CARDIOVASCULAR: No chest pain, orthopnea, edema.  GASTROINTESTINAL: No nausea, vomiting, diarrhea or abdominal pain.  GENITOURINARY: No dysuria, hematuria.  ENDOCRINE: No polyuria, nocturia,  HEMATOLOGY: No anemia, easy bruising or bleeding SKIN: No rash or lesion. MUSCULOSKELETAL: No joint pain or  arthritis.   NEUROLOGIC: No tingling, numbness, weakness.  PSYCHIATRY: No anxiety or depression.   MEDICATIONS AT HOME:  Prior to Admission medications   Medication Sig Start Date End Date Taking? Authorizing Provider  albuterol (PROVENTIL HFA;VENTOLIN HFA) 108 (90 Base) MCG/ACT inhaler Inhale 2 puffs into the lungs every 6 (six) hours as needed for wheezing or shortness of breath. 03/20/17  Yes Don Perking, [place], MD  albuterol  (PROVENTIL) (2.5 MG/3ML) 0.083% nebulizer solution Take 2.5 mg by nebulization every 4 (four) hours as needed for wheezing or shortness of breath.   Yes [provider]  amphetamine-dextroamphetamine (ADDERALL XR) 15 MG 24 hr capsule Take 15 mg by mouth every morning.   Yes [provider]  beclomethasone (QVAR) 40 MCG/ACT inhaler Inhale 2 puffs into the lungs 2 (two) times daily.   Yes [provider]  BELSOMRA 20 MG TABS Take 20 mg by mouth at bedtime. 02/07/17  Yes [provider]  carbamazepine (TEGRETOL) 200 MG tablet Take 200 mg by mouth daily. 03/26/17  Yes [provider]  Cariprazine HCl (VRAYLAR) 3 MG CAPS Take 1 capsule by mouth at bedtime.    Yes [provider]  clonazePAM (KLONOPIN) 2 MG tablet Take 2 mg by mouth at bedtime as needed for anxiety.   Yes [provider]  diltiazem (CARDIZEM CD) 360 MG 24 hr capsule Take 360 mg by mouth daily.    Yes [provider]  folic acid (FOLVITE) 1 MG tablet Take 1 mg by mouth daily.  12/15/16  Yes [provider]  furosemide (LASIX) 20 MG tablet Take 20 mg by mouth every morning.   Yes [provider]  gabapentin (NEURONTIN) 300 MG capsule Take 300 mg by mouth 3 (three) times daily.   Yes [provider]  hydrochlorothiazide (HYDRODIURIL) 25 MG tablet Take 25 mg by mouth daily. 03/03/17  Yes [provider]  ibuprofen (ADVIL,MOTRIN) 800 MG tablet Take 1 tablet (800 mg total) by mouth every 8 (eight) hours as needed (pain). 11/09/16  Yes Hagler, Jami L, PA-C  metoprolol succinate (TOPROL-XL) 100 MG 24 hr tablet Take 100 mg by mouth 2 (two) times daily. 03/26/17  Yes [provider]  orlistat (XENICAL) 120 MG capsule Take 120 mg by mouth 3 (three) times daily with meals.   Yes [provider]  potassium chloride (K-DUR) 10 MEQ tablet Take 10 mEq by mouth every morning.   Yes [provider]  tiZANidine (ZANAFLEX) 4 MG  tablet Take 4 mg by mouth 3 (three) times daily as needed. 03/26/17  Yes [provider]  guaiFENesin-codeine 100-10 MG/5ML syrup Take 5 mLs by mouth every 6 (six) hours as needed for cough. Patient not taking: Reported on 04/14/2017 03/31/17   Minna Antis, MD  HYDROcodone-acetaminophen (NORCO/VICODIN) 5-325 MG tablet Take 2 tablets by mouth every 4 (four) hours as needed. Patient not taking: Reported on 04/14/2017 03/17/17   Earley Favor, NP  ibuprofen (ADVIL,MOTRIN) 600 MG tablet Take 1 tablet (600 mg total) by mouth every 6 (six) hours as needed. Patient not taking: Reported on 04/14/2017 12/22/16   Joni Reining, PA-C  lidocaine (XYLOCAINE) 2 % solution Use as directed 5 mLs in the mouth or throat every 6 (six) hours as needed for mouth pain. Mixed with 5 ML's all Bromfed-DM for swish and swallow Patient not taking: Reported on 01/16/2017 08/30/16   Joni Reining, PA-C  oxyCODONE-acetaminophen (ROXICET) 5-325 MG tablet Take 1-2 tablets by mouth every 4 (four) hours as  needed for severe pain. Patient not taking: Reported on 01/16/2017 09/05/16   Linus Salmons, MD      PHYSICAL EXAMINATION:   VITAL SIGNS: Blood pressure 129/83, pulse (!) 105, temperature 98.2 F (36.8 C), temperature source Oral, resp. rate (!) 24, height 5\' 10"  (1.778 m), weight (!) 365 lb (165.6 kg), SpO2 98 %.  GENERAL:  31 y.o.-year-old patient lying in the bed with no acute distress.  EYES: Pupils equal, round, reactive to light and accommodation. No scleral icterus. Extraocular muscles intact.  HEENT: Head atraumatic, normocephalic. Oropharynx and nasopharynx clear.  NECK:  Supple, no jugular venous distention. No thyroid enlargement, no tenderness.  LUNGS: Occasional wheezing, no crackles or so sorry muscle usage CARDIOVASCULAR: S1, S2 normal. No murmurs, rubs, or gallops.  ABDOMEN: Soft, nontender, nondistended. Bowel sounds present. No organomegaly or mass.  EXTREMITIES: No pedal edema, cyanosis, or  clubbing.  NEUROLOGIC: Cranial nerves II through XII are intact. Muscle strength 5/5 in all extremities. Sensation intact. Gait not checked.  PSYCHIATRIC: The patient is alert and oriented x 3.  SKIN: No obvious rash, lesion, or ulcer.   LABORATORY PANEL:   CBC  Recent Labs Lab 04/14/17 1313  WBC 9.1  HGB 12.3  HCT 37.3  PLT 254  MCV 81.9  MCH 27.1  MCHC 33.1  RDW 15.0*   ------------------------------------------------------------------------------------------------------------------  Chemistries   Recent Labs Lab 04/14/17 1313  NA 141  K 3.5  CL 107  CO2 26  GLUCOSE 161*  BUN 7  CREATININE 0.70  CALCIUM 9.3   ------------------------------------------------------------------------------------------------------------------ estimated creatinine clearance is 175.8 mL/min (by C-G formula based on SCr of 0.7 mg/dL). ------------------------------------------------------------------------------------------------------------------ No results for input(s): TSH, T4TOTAL, T3FREE, THYROIDAB in the last 72 hours.  Invalid input(s): FREET3   Coagulation profile No results for input(s): INR, PROTIME in the last 168 hours. ------------------------------------------------------------------------------------------------------------------- No results for input(s): DDIMER in the last 72 hours. -------------------------------------------------------------------------------------------------------------------  Cardiac Enzymes  Recent Labs Lab 04/14/17 1313 04/14/17 1636  TROPONINI <0.03 <0.03   ------------------------------------------------------------------------------------------------------------------ Invalid input(s): POCBNP  ---------------------------------------------------------------------------------------------------------------  Urinalysis    Component Value Date/Time   COLORURINE YELLOW (A) 01/04/2017 1916   APPEARANCEUR CLOUDY (A) 01/04/2017 1916    LABSPEC 1.018 01/04/2017 1916   PHURINE 7.0 01/04/2017 1916   GLUCOSEU NEGATIVE 01/04/2017 1916   HGBUR NEGATIVE 01/04/2017 1916   BILIRUBINUR NEGATIVE 01/04/2017 1916   KETONESUR NEGATIVE 01/04/2017 1916   PROTEINUR NEGATIVE 01/04/2017 1916   NITRITE NEGATIVE 01/04/2017 1916   LEUKOCYTESUR NEGATIVE 01/04/2017 1916     RADIOLOGY: Dg Chest 2 View  Result Date: 04/14/2017 CLINICAL DATA:  Pt c/o cough, shortness of breath, and chest pain. Pt states was treated for pneumonia a few weeks ago, states feels like her symptoms have no resolved. Pt presents via [facility], per EMS RA sat 88%, on arrival to ED O2 saturation 100% on RA. EMS reports that per patient last asthma attack was this AM. Pt states she is likely to "passout" if standing, hx of tachycardia. Images done with pt sitting in w/c EXAM: CHEST  2 VIEW COMPARISON:  03/31/2017 FINDINGS: The heart size and mediastinal contours are within normal limits. Lung volumes are low. Lungs are clear. No pleural effusion or pneumothorax. The visualized skeletal structures are unremarkable. IMPRESSION: No active cardiopulmonary disease. Electronically Signed   By: Amie Portland M.D.   On: 04/14/2017 14:24    EKG: Orders placed or performed during the hospital encounter of 04/14/17  . ED EKG within 10 minutes  . ED EKG within 10  minutes    IMPRESSION AND PLAN: Patient is a 31 year old African-American female with asthma presenting with shortness of breath  1. Acute asthma exasperation we will treat with nebulizers steroids Due to acute bronchitis so place her on antibiotics  2. Dizziness and history of SVT reports that she has never had an echo obtained echocardiogram of the heart I will continue her cardizem  3. Essential hypertension continue her home regimen  4. Fibromyalgia anxiety and depression continue her gabapentin Klonopin Tegretol and Neurontin  5. Miscellaneous Lovenox for DVT prophylaxis    All the records are reviewed and case  discussed with ED provider. Management plans discussed with the patient, family and they are in agreement.  CODE STATUS: Code Status History    This patient does not have a recorded code status. Please follow your organizational policy for patients in this situation.       TOTAL TIME TAKING CARE OF THIS PATIENT:82minutes.    Auburn Bilberry M.D on 04/14/2017 at 5:42 PM  Between 7am to 6pm - Pager - [phone]  After 6pm go to [email or web address] - password [email or web address] [facility]  [facility]  Office  [phone]  CC: Primary care physician; Emogene Morgan, MD

## 2017-04-14 NOTE — Progress Notes (Signed)
PHARMACIST - PHYSICIAN COMMUNICATION  CONCERNING:  Enoxaparin (Lovenox) for DVT Prophylaxis    RECOMMENDATION: Patient was prescribed enoxaprin 40mg  q24 hours for VTE prophylaxis.   Filed Weights   04/14/17 1307  Weight: (!) 365 lb (165.6 kg)    Body mass index is 52.37 kg/m.  Estimated Creatinine Clearance: 175.8 mL/min (by C-G formula based on SCr of 0.7 mg/dL).   Based on [facility] policy patient is candidate for enoxaparin 40mg  every 12 hour dosing due to BMI being >40.   DESCRIPTION: Pharmacy has adjusted enoxaparin dose per [facility] policy, approved through [employer] committee.  Patient is now receiving enoxaparin 40mg  every 12 hours.    Cher Nakai, PharmD Clinical Pharmacist  04/14/2017 7:39 PM

## 2017-04-15 ENCOUNTER — Observation Stay
Admit: 2017-04-15 | Discharge: 2017-04-15 | Disposition: A | Discharge status: Home / Self Care | Payer: BLUE CROSS/BLUE SHIELD | Attending: Internal Medicine | Admitting: Internal Medicine

## 2017-04-15 LAB — BASIC METABOLIC PANEL
Anion gap: 7 (ref 5–15)
BUN: 6 mg/dL (ref 6–20)
CO2: 27 mmol/L (ref 22–32)
Calcium: 9.4 mg/dL (ref 8.9–10.3)
Chloride: 105 mmol/L (ref 101–111)
Creatinine, Ser: 0.8 mg/dL (ref 0.44–1.00)
GFR calc Af Amer: >60 mL/min (ref >60)
GFR calc non Af Amer: >60 mL/min (ref >60)
Glucose, Bld: 260 mg/dL — ABNORMAL HIGH (ref 65–99)
Potassium: 4.5 mmol/L (ref 3.5–5.1)
Sodium: 139 mmol/L (ref 135–145)

## 2017-04-15 LAB — CBC
HCT: 36.5 % (ref 35.0–47.0)
Hemoglobin: 12 g/dL (ref 12.0–16.0)
MCH: 27.2 pg (ref 26.0–34.0)
MCHC: 32.8 g/dL (ref 32.0–36.0)
MCV: 82.9 fL (ref 80.0–100.0)
Platelets: 254 10*3/uL (ref 150–440)
RBC: 4.4 MIL/uL (ref 3.80–5.20)
RDW: 15.6 % — ABNORMAL HIGH (ref 11.5–14.5)
WBC: 9.5 10*3/uL (ref 3.6–11.0)

## 2017-04-15 LAB — ECHOCARDIOGRAM COMPLETE
Height: 70 in
Weight: 5840 oz

## 2017-04-15 LAB — TSH: TSH: 0.118 u[IU]/mL — ABNORMAL LOW (ref 0.350–4.500)

## 2017-04-15 MED ORDER — LEVALBUTEROL HCL 1.25 MG/0.5ML IN NEBU: 1.2500 mg | INHALATION_SOLUTION | Freq: Four times a day (QID) | RESPIRATORY_TRACT | Status: DC | PRN

## 2017-04-15 MED ORDER — PREDNISONE 50 MG PO TABS
50.0000 mg | ORAL_TABLET | Freq: Every day | ORAL | Status: DC
  Administered 2017-04-15 – 2017-04-16 (×2): 50 mg via ORAL
  Filled 2017-04-15 (×2): qty 1

## 2017-04-15 NOTE — Progress Notes (Signed)
[facility] - [place] [facility]   PATIENT NAME: Andrea Miller    MR#:  [id number]  DATE OF BIRTH:  1986-04-04  SUBJECTIVE:   Patient feeling weak.  When she ambulates her heart rates are going up  REVIEW OF SYSTEMS:    Review of Systems  Constitutional: Negative for fever, chills weight loss  Positive for weakness and fatigue HENT: Negative for ear pain, nosebleeds, congestion, facial swelling, rhinorrhea, neck pain, neck stiffness and ear discharge.   Respiratory: Negative for cough,++ shortness of breath, NO wheezing  Cardiovascular: Negative for chest pain, palpitations and positive leg swelling.  Gastrointestinal: Negative for heartburn, abdominal pain, vomiting, diarrhea or consitpation Genitourinary: Negative for dysuria, urgency, frequency, hematuria Musculoskeletal: Negative for back pain or joint pain Neurological: Negative for dizziness, seizures, syncope, focal weakness,  numbness and headaches.  Hematological: Does not bruise/bleed easily.  Psychiatric/Behavioral: Negative for hallucinations, confusion, dysphoric mood    Tolerating Diet:* yes     DRUG ALLERGIES:   Allergies  Allergen Reactions  . Cortizone-10 [Hydrocortisone] Shortness Of Breath and Swelling  . Garlic Anaphylaxis  . Penicillins     Has patient had a PCN reaction causing immediate rash, facial/tongue/throat swelling, SOB or lightheadedness with hypotension:Yes Has patient had a PCN reaction causing severe rash involving mucus membranes or skin necrosis:No Has patient had a PCN reaction that required hospitalization:No Has patient had a PCN reaction occurring within the last 10 years:Yes. If all of the above answers are "NO", then may proceed with Cephalosporin use.   . Corticosteroids Palpitations    VITALS:  Blood pressure (!) 119/59, pulse 99, temperature 97.6 F (36.4 C), temperature source Oral, resp. rate (!) 24, height 5\' 10"  (1.778 m), weight (!) 165.6 kg  (365 lb), SpO2 97 %.  PHYSICAL EXAMINATION:  Constitutional: Appears obese looks tired. No distress. HENT: Normocephalic. Marland Kitchen Oropharynx is clear and moist.  Eyes: Conjunctivae and EOM are normal. PERRLA, no scleral icterus.  Neck: Normal ROM. Neck supple. No JVD. No tracheal deviation. CVS: tachy S1/S2 +, no murmurs, no gallops, no carotid bruit.  Pulmonary: Effort and breath sounds normal, no stridor, rhonchi, wheezes, rales.  Abdominal: Soft. BS +,  no distension, tenderness, rebound or guarding.  Musculoskeletal: Normal range of motion. 1+LEE and no tenderness.  Neuro: Alert. CN 2-12 grossly intact. No focal deficits. Skin: Skin is warm and dry. No rash noted. Psychiatric: Normal mood and affect.      LABORATORY PANEL:   CBC  Recent Labs Lab 04/14/17 1313  WBC 9.1  HGB 12.3  HCT 37.3  PLT 254   ------------------------------------------------------------------------------------------------------------------  Chemistries   Recent Labs Lab 04/14/17 1313  NA 141  K 3.5  CL 107  CO2 26  GLUCOSE 161*  BUN 7  CREATININE 0.70  CALCIUM 9.3   ------------------------------------------------------------------------------------------------------------------  Cardiac Enzymes  Recent Labs Lab 04/14/17 1313 04/14/17 1636  TROPONINI <0.03 <0.03   ------------------------------------------------------------------------------------------------------------------  RADIOLOGY:  Dg Chest 2 View  Result Date: 04/14/2017 CLINICAL DATA:  Pt c/o cough, shortness of breath, and chest pain. Pt states was treated for pneumonia a few weeks ago, states feels like her symptoms have no resolved. Pt presents via [facility], per EMS RA sat 88%, on arrival to ED O2 saturation 100% on RA. EMS reports that per patient last asthma attack was this AM. Pt states she is likely to "passout" if standing, hx of tachycardia. Images done with pt sitting in w/c EXAM: CHEST  2 VIEW COMPARISON:  03/31/2017  FINDINGS: The  heart size and mediastinal contours are within normal limits. Lung volumes are low. Lungs are clear. No pleural effusion or pneumothorax. The visualized skeletal structures are unremarkable. IMPRESSION: No active cardiopulmonary disease. Electronically Signed   By: Amie Portland M.D.   On: 04/14/2017 14:24     ASSESSMENT AND PLAN:   31 year old female with a history of fibromyalgia, migraines, sleep apnea, pseudoseizures and inappropriate sinus tachycardia who presents with shortness of breath.  1. Acute asthma exacerbation: Patient symptoms have improved. I will discontinue IV steroids and start prednisone for 5 days. I will change albuterol to Xopenex due to tachycardia. Continue azithromycin for acute bronchitis which triggered acute asthma exacerbation.  2. Inappropriate sinus tachycardia: Patient has seen electrophysiologist at [facility]. She has been on maximal dose of beta blocker and high-dose diltiazem without much relief and recommendations were to start ivabridine 2.5mg  BID. She cannot afford this and is trying appeal her insurance company. Obtain cardiology consultation  3. Lower extremity edema: Order echo Continue Lasix and HCTZ Follow BMP 4. OSA: Patient is not compliant with CPAP Will try here   5. ADHD: Continue Adderall   6. Anxiety: Continue clonazepam       Management plans discussed with the patient and she is in agreement.  CODE STATUS: full  TOTAL TIME TAKING CARE OF THIS PATIENT: 30 minutes.     POSSIBLE D/C tomorrow, DEPENDING ON CLINICAL CONDITION.   Corita Allinson M.D on 04/15/2017 at 7:48 AM  Between 7am to 6pm - Pager - [phone] After 6pm go to [email or web address] - Social research officer, government  [facility] [place] [facility]  Office  [phone]  CC: Primary care physician; Emogene Morgan, MD  Note: This dictation was prepared with Dragon dictation along with smaller phrase technology. Any transcriptional errors that result from this process  are unintentional.

## 2017-04-15 NOTE — Plan of Care (Signed)
Problem: Education: Goal: Knowledge of [facility] Education information/materials will improve Outcome: Progressing Pt likes to be called Titi  PAST MEDICAL HISTORY:   Past Medical History:  Diagnosis Date  . Anxiety   . Asthma   . Bipolar disorder (HCC)   . Chest pain    and angina  . Depression   . Fibromyalgia   . Hypertension   . 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 Obesity   . Seizures (HCC)    secondary to anxiety  . SVT (supraventricular tachycardia) (HCC)    with hx of syncope; managed by [facility]   Pt is well controlled with home medications

## 2017-04-15 NOTE — Progress Notes (Signed)
*  PRELIMINARY RESULTS* Echocardiogram 2D Echocardiogram has been performed.  Andrea Miller 04/15/2017, 10:01 AM

## 2017-04-15 NOTE — Clinical Social Work Note (Signed)
CSW received consult that the patient has medication needs. The CSW does not assist with medications; RNCM assists with medications. The [facility] consult has already been placed. CSW signing off.   Argentina Ponder, MSW, Theresia Majors [phone]
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
## 2017-04-15 NOTE — Progress Notes (Signed)
Nutrition Brief Note  Patient identified on the Malnutrition Screening Tool (MST) Report  Wt Readings from Last 15 Encounters:  04/14/17 (!) 365 lb (165.6 kg)  03/31/17 (!) 364 lb (165.1 kg)  03/20/17 (!) 360 lb (163.3 kg)  03/16/17 (!) 360 lb (163.3 kg)  02/19/17 (!) 360 lb (163.3 kg)  01/16/17 (!) 367 lb 11.2 oz (166.8 kg)  01/04/17 (!) 360 lb (163.3 kg)  12/22/16 (!) 320 lb (145.2 kg)  11/30/16 (!) 340 lb (154.2 kg)  11/09/16 (!) 340 lb (154.2 kg)  09/01/16 (!) 330 lb (149.7 kg)  08/30/16 (!) 330 lb (149.7 kg)  08/10/16 (!) 330 lb (149.7 kg)  06/15/16 (!) 310 lb (140.6 kg)  06/12/16 (!) 315 lb (142.9 kg)    Body mass index is 52.37 kg/m. Patient meets criteria for obese class III based on current BMI.   Current diet order is heart healthy, patient is consuming approximately 100% of meals at this time. Labs and medications reviewed.   No nutrition interventions warranted at this time. If nutrition issues arise, please consult RD.   Dionne Ano. Cannen Dupras, MS, RD LDN Inpatient Clinical Dietitian Pager [phone]

## 2017-04-15 NOTE — Progress Notes (Signed)
Patient has been eating non-stop this shift.  Patient eating to the point that she vomited and then continues to eat despite the fact that she has vomited.  Patient having family bring in fast food from outside the hospital and she is asking for extra trays this evening to keep in her room during the night in case she gets hungry.  Orson Ape, RN

## 2017-04-16 LAB — BASIC METABOLIC PANEL
Anion gap: 7 (ref 5–15)
BUN: 6 mg/dL (ref 6–20)
CO2: 29 mmol/L (ref 22–32)
Calcium: 9.3 mg/dL (ref 8.9–10.3)
Chloride: 99 mmol/L — ABNORMAL LOW (ref 101–111)
Creatinine, Ser: 0.75 mg/dL (ref 0.44–1.00)
GFR calc Af Amer: >60 mL/min (ref >60)
GFR calc non Af Amer: >60 mL/min (ref >60)
Glucose, Bld: 196 mg/dL — ABNORMAL HIGH (ref 65–99)
Potassium: 3.3 mmol/L — ABNORMAL LOW (ref 3.5–5.1)
Sodium: 135 mmol/L (ref 135–145)

## 2017-04-16 MED ORDER — PREDNISONE 50 MG PO TABS: ORAL_TABLET | ORAL | 0 refills | Status: DC

## 2017-04-16 MED ORDER — POTASSIUM CHLORIDE 20 MEQ PO PACK
20.0000 meq | PACK | ORAL | Status: DC
  Administered 2017-04-16: 20 meq via ORAL
  Filled 2017-04-16: qty 1

## 2017-04-16 MED ORDER — AZITHROMYCIN 500 MG PO TABS: 500.0000 mg | ORAL_TABLET | Freq: Every day | ORAL | 0 refills | Status: DC

## 2017-04-16 NOTE — Discharge Summary (Signed)
[employer] [facility] [place] at [facility]   PATIENT NAME: Andrea Miller    MR#:  [id number]  DATE OF BIRTH:  11-03-1986  DATE OF ADMISSION:  04/14/2017 ADMITTING PHYSICIAN: Auburn Bilberry, MD  DATE OF DISCHARGE: 04/16/2017  PRIMARY CARE PHYSICIAN: Emogene Morgan, MD    ADMISSION DIAGNOSIS:  Moderate asthma with exacerbation, unspecified whether persistent [J45.901]  DISCHARGE DIAGNOSIS:  Active Problems:   Asthma exacerbation   SECONDARY DIAGNOSIS:   Past Medical History:  Diagnosis Date  . Anxiety   . Asthma   . Bipolar disorder (HCC)   . Chest pain    and angina  . Depression   . Fibromyalgia   . Hypertension   . 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 Obesity   . Seizures (HCC)    secondary to anxiety  . SVT (supraventricular tachycardia) (HCC)    with hx of syncope; managed by [facility] COURSE:   31 year old female with a history of fibromyalgia, migraines, sleep apnea, pseudoseizures and inappropriate sinus tachycardia who presents with shortness of breath.  1. Acute asthma exacerbation: Patient's symptoms have improved. She will be discharged on prednisone for 5 days. Continue azithromycin for acute bronchitis which triggered acute asthma exacerbation.  2. Inappropriate sinus tachycardia: Patient has seen electrophysiologist at [facility]. She has been on maximal dose of beta blocker and high-dose diltiazem without much relief and recommendations were to start ivabridine 2.5mg  BID. She cannot afford this and is trying appeal her insurance company.  She was evaluated by Cardiology while in the hospital.  She will follow up with her Cardiologist at [facility].   3. Lower extremity edema:Echocardiogram shows normal ejection fraction without valvular abnormalities. I'm suspecting lower extremity edema is primarily due to poor venous circulation in addition to underlying obstructive sleep apnea.  Continue Lasix and  HCTZ Continue compression hose 4. OSA: Patient is not compliant with CPAP He is encouraged to use CPAP machine  5. ADHD: Continue Adderall   6. Anxiety: Continue clonazepam    DISCHARGE CONDITIONS AND DIET:   Stable for discharge and heart healthy diet  CONSULTS OBTAINED:  Treatment Team:  Lamar Blinks, MD  DRUG ALLERGIES:   Allergies  Allergen Reactions  . Cortizone-10 [Hydrocortisone] Shortness Of Breath and Swelling  . Garlic Anaphylaxis  . Penicillins     Has patient had a PCN reaction causing immediate rash, facial/tongue/throat swelling, SOB or lightheadedness with hypotension:Yes Has patient had a PCN reaction causing severe rash involving mucus membranes or skin necrosis:No Has patient had a PCN reaction that required hospitalization:No Has patient had a PCN reaction occurring within the last 10 years:Yes. If all of the above answers are "NO", then may proceed with Cephalosporin use.   . Corticosteroids Palpitations    DISCHARGE MEDICATIONS:   Current Discharge Medication List    START taking these medications   Details  azithromycin (ZITHROMAX) 500 MG tablet Take 1 tablet (500 mg total) by mouth daily. Qty: 3 tablet, Refills: 0    predniSONE (DELTASONE) 50 MG tablet Take 1 tablet daily for 3 days then stop Qty: 3 tablet, Refills: 0      CONTINUE these medications which have NOT CHANGED   Details  albuterol (PROVENTIL HFA;VENTOLIN HFA) 108 (90 Base) MCG/ACT inhaler Inhale 2 puffs into the lungs every 6 (six) hours as needed for wheezing or shortness of breath. Qty: 1 Inhaler, Refills: 2    albuterol (PROVENTIL) (2.5 MG/3ML) 0.083% nebulizer  solution Take 2.5 mg by nebulization every 4 (four) hours as needed for wheezing or shortness of breath.    amphetamine-dextroamphetamine (ADDERALL XR) 15 MG 24 hr capsule Take 15 mg by mouth every morning.    beclomethasone (QVAR) 40 MCG/ACT inhaler Inhale 2 puffs into the lungs 2 (two) times daily.     BELSOMRA 20 MG TABS Take 20 mg by mouth at bedtime. Refills: 0    carbamazepine (TEGRETOL) 200 MG tablet Take 200 mg by mouth daily. Refills: 1    Cariprazine HCl (VRAYLAR) 3 MG CAPS Take 1 capsule by mouth at bedtime.     clonazePAM (KLONOPIN) 2 MG tablet Take 2 mg by mouth at bedtime as needed for anxiety.    diltiazem (CARDIZEM CD) 360 MG 24 hr capsule Take 360 mg by mouth daily.     folic acid (FOLVITE) 1 MG tablet Take 1 mg by mouth daily.  Refills: 10    furosemide (LASIX) 20 MG tablet Take 20 mg by mouth every morning.    gabapentin (NEURONTIN) 300 MG capsule Take 300 mg by mouth 3 (three) times daily.    hydrochlorothiazide (HYDRODIURIL) 25 MG tablet Take 25 mg by mouth daily. Refills: 4    metoprolol succinate (TOPROL-XL) 100 MG 24 hr tablet Take 100 mg by mouth 2 (two) times daily. Refills: 4    orlistat (XENICAL) 120 MG capsule Take 120 mg by mouth 3 (three) times daily with meals.    potassium chloride (K-DUR) 10 MEQ tablet Take 10 mEq by mouth every morning.    tiZANidine (ZANAFLEX) 4 MG tablet Take 4 mg by mouth 3 (three) times daily as needed. Refills: 1    guaiFENesin-codeine 100-10 MG/5ML syrup Take 5 mLs by mouth every 6 (six) hours as needed for cough. Qty: 120 mL, Refills: 0    ibuprofen (ADVIL,MOTRIN) 600 MG tablet Take 1 tablet (600 mg total) by mouth every 6 (six) hours as needed. Qty: 30 tablet, Refills: 0      STOP taking these medications     HYDROcodone-acetaminophen (NORCO/VICODIN) 5-325 MG tablet      lidocaine (XYLOCAINE) 2 % solution      oxyCODONE-acetaminophen (ROXICET) 5-325 MG tablet           Today   CHIEF COMPLAINT:   No acute issues overnight. Heart rate better controlled   VITAL SIGNS:  Blood pressure 122/62, pulse 89, temperature 98.6 F (37 C), resp. rate 20, height 5\' 10"  (1.778 m), weight (!) 165.6 kg (365 lb), SpO2 100 %.   REVIEW OF SYSTEMS:  Review of Systems  Constitutional: Negative.  Negative for  chills, fever and malaise/fatigue.  HENT: Negative.  Negative for ear discharge, ear pain, hearing loss, nosebleeds and sore throat.   Eyes: Negative.  Negative for blurred vision and pain.  Respiratory: Negative.  Negative for cough, hemoptysis, shortness of breath and wheezing.   Cardiovascular: Negative.  Negative for chest pain, palpitations and leg swelling.  Gastrointestinal: Negative.  Negative for abdominal pain, blood in stool, diarrhea, nausea and vomiting.  Genitourinary: Negative.  Negative for dysuria.  Musculoskeletal: Negative.  Negative for back pain.  Skin: Negative.   Neurological: Negative for dizziness, tremors, speech change, focal weakness, seizures and headaches.  Endo/Heme/Allergies: Negative.  Does not bruise/bleed easily.  Psychiatric/Behavioral: Negative.  Negative for depression, hallucinations and suicidal ideas.     PHYSICAL EXAMINATION:  GENERAL:  31 y.o.-year-old patient lying in the bed with no acute distress. obese NECK:  Supple, no jugular venous distention. No  thyroid enlargement, no tenderness.  LUNGS: Normal breath sounds bilaterally, no wheezing, rales,rhonchi  No use of accessory muscles of respiration.  CARDIOVASCULAR: S1, S2 normal. No murmurs, rubs, or gallops.  ABDOMEN: Soft, non-tender, non-distended. Bowel sounds present. No organomegaly or mass.  EXTREMITIES: No pedal edema, cyanosis, or clubbing.  PSYCHIATRIC: The patient is alert and oriented x 3.  SKIN: No obvious rash, lesion, or ulcer.   DATA REVIEW:   CBC  Recent Labs Lab 04/15/17 0742  WBC 9.5  HGB 12.0  HCT 36.5  PLT 254    Chemistries   Recent Labs Lab 04/16/17 0510  NA 135  K 3.3*  CL 99*  CO2 29  GLUCOSE 196*  BUN 6  CREATININE 0.75  CALCIUM 9.3    Cardiac Enzymes  Recent Labs Lab 04/14/17 1313 04/14/17 1636  TROPONINI <0.03 <0.03    Microbiology Results  @[email or web address] @  RADIOLOGY:  Dg Chest 2 View  Result Date: 04/14/2017 CLINICAL DATA:  Pt  c/o cough, shortness of breath, and chest pain. Pt states was treated for pneumonia a few weeks ago, states feels like her symptoms have no resolved. Pt presents via [facility], per EMS RA sat 88%, on arrival to ED O2 saturation 100% on RA. EMS reports that per patient last asthma attack was this AM. Pt states she is likely to "passout" if standing, hx of tachycardia. Images done with pt sitting in w/c EXAM: CHEST  2 VIEW COMPARISON:  03/31/2017 FINDINGS: The heart size and mediastinal contours are within normal limits. Lung volumes are low. Lungs are clear. No pleural effusion or pneumothorax. The visualized skeletal structures are unremarkable. IMPRESSION: No active cardiopulmonary disease. Electronically Signed   By: Amie Portland M.D.   On: 04/14/2017 14:24      Current Discharge Medication List    START taking these medications   Details  azithromycin (ZITHROMAX) 500 MG tablet Take 1 tablet (500 mg total) by mouth daily. Qty: 3 tablet, Refills: 0    predniSONE (DELTASONE) 50 MG tablet Take 1 tablet daily for 3 days then stop Qty: 3 tablet, Refills: 0      CONTINUE these medications which have NOT CHANGED   Details  albuterol (PROVENTIL HFA;VENTOLIN HFA) 108 (90 Base) MCG/ACT inhaler Inhale 2 puffs into the lungs every 6 (six) hours as needed for wheezing or shortness of breath. Qty: 1 Inhaler, Refills: 2    albuterol (PROVENTIL) (2.5 MG/3ML) 0.083% nebulizer solution Take 2.5 mg by nebulization every 4 (four) hours as needed for wheezing or shortness of breath.    amphetamine-dextroamphetamine (ADDERALL XR) 15 MG 24 hr capsule Take 15 mg by mouth every morning.    beclomethasone (QVAR) 40 MCG/ACT inhaler Inhale 2 puffs into the lungs 2 (two) times daily.    BELSOMRA 20 MG TABS Take 20 mg by mouth at bedtime. Refills: 0    carbamazepine (TEGRETOL) 200 MG tablet Take 200 mg by mouth daily. Refills: 1    Cariprazine HCl (VRAYLAR) 3 MG CAPS Take 1 capsule by mouth at bedtime.      clonazePAM (KLONOPIN) 2 MG tablet Take 2 mg by mouth at bedtime as needed for anxiety.    diltiazem (CARDIZEM CD) 360 MG 24 hr capsule Take 360 mg by mouth daily.     folic acid (FOLVITE) 1 MG tablet Take 1 mg by mouth daily.  Refills: 10    furosemide (LASIX) 20 MG tablet Take 20 mg by mouth every morning.    gabapentin (NEURONTIN) 300 MG capsule Take  300 mg by mouth 3 (three) times daily.    hydrochlorothiazide (HYDRODIURIL) 25 MG tablet Take 25 mg by mouth daily. Refills: 4    metoprolol succinate (TOPROL-XL) 100 MG 24 hr tablet Take 100 mg by mouth 2 (two) times daily. Refills: 4    orlistat (XENICAL) 120 MG capsule Take 120 mg by mouth 3 (three) times daily with meals.    potassium chloride (K-DUR) 10 MEQ tablet Take 10 mEq by mouth every morning.    tiZANidine (ZANAFLEX) 4 MG tablet Take 4 mg by mouth 3 (three) times daily as needed. Refills: 1    guaiFENesin-codeine 100-10 MG/5ML syrup Take 5 mLs by mouth every 6 (six) hours as needed for cough. Qty: 120 mL, Refills: 0    ibuprofen (ADVIL,MOTRIN) 600 MG tablet Take 1 tablet (600 mg total) by mouth every 6 (six) hours as needed. Qty: 30 tablet, Refills: 0      STOP taking these medications     HYDROcodone-acetaminophen (NORCO/VICODIN) 5-325 MG tablet      lidocaine (XYLOCAINE) 2 % solution      oxyCODONE-acetaminophen (ROXICET) 5-325 MG tablet            Management plans discussed with the patient and she is in agreement. Stable for discharge home  Patient should follow up with pcp  CODE STATUS:     Code Status Orders        Start     Ordered   04/14/17 1934  Full code  Continuous     04/14/17 1933    Code Status History    Date Active Date Inactive Code Status Order ID Comments User Context   This patient has a current code status but no historical code status.      TOTAL TIME TAKING CARE OF THIS PATIENT: 37 minutes.    Note: This dictation was prepared with Dragon dictation along with  smaller phrase technology. Any transcriptional errors that result from this process are unintentional.  Myrikal Messmer M.D on 04/16/2017 at 9:08 AM  Between 7am to 6pm - Pager - [phone] After 6pm go to [email or web address] - Social research officer, government  [facility]  Office  [phone]  CC: Primary care physician; Emogene Morgan, MD

## 2017-04-16 NOTE — Progress Notes (Signed)
Discussed discharge instructions and medications with pt and her mother. IV removed. All questions addressed. Pt transported home via car by her mother.  Orson Ape, RN

## 2017-04-17 LAB — HIV ANTIBODY (ROUTINE TESTING W REFLEX): HIV Screen 4th Generation wRfx: NONREACTIVE

## 2017-04-25 ENCOUNTER — Other Ambulatory Visit: Payer: Self-pay | Admitting: Family Medicine

## 2017-04-25 DIAGNOSIS — M79604 Pain in right leg: Secondary | ICD-10-CM

## 2017-04-25 DIAGNOSIS — M79605 Pain in left leg: Principal | ICD-10-CM

## 2017-04-30 ENCOUNTER — Ambulatory Visit: Payer: BLUE CROSS/BLUE SHIELD

## 2017-05-07 ENCOUNTER — Ambulatory Visit: Payer: BLUE CROSS/BLUE SHIELD

## 2017-06-26 ENCOUNTER — Emergency Department: Payer: BLUE CROSS/BLUE SHIELD

## 2017-06-26 ENCOUNTER — Emergency Department
Admission: EM | Admit: 2017-06-26 | Discharge: 2017-06-26 | Disposition: A | Discharge status: Home / Self Care | Payer: BLUE CROSS/BLUE SHIELD | Attending: Emergency Medicine | Admitting: Emergency Medicine

## 2017-06-26 DIAGNOSIS — Z79899 Other long term (current) drug therapy: Secondary | ICD-10-CM | POA: Diagnosis not present

## 2017-06-26 DIAGNOSIS — R059 Cough, unspecified: Secondary | ICD-10-CM

## 2017-06-26 DIAGNOSIS — R0602 Shortness of breath: Secondary | ICD-10-CM | POA: Insufficient documentation

## 2017-06-26 DIAGNOSIS — J45909 Unspecified asthma, uncomplicated: Secondary | ICD-10-CM | POA: Insufficient documentation

## 2017-06-26 DIAGNOSIS — R079 Chest pain, unspecified: Secondary | ICD-10-CM | POA: Diagnosis present

## 2017-06-26 DIAGNOSIS — R05 Cough: Secondary | ICD-10-CM

## 2017-06-26 DIAGNOSIS — R000 Tachycardia, unspecified: Secondary | ICD-10-CM

## 2017-06-26 LAB — CBC
HCT: 38.7 % (ref 35.0–47.0)
Hemoglobin: 12.4 g/dL (ref 12.0–16.0)
MCH: 26.8 pg (ref 26.0–34.0)
MCHC: 32 g/dL (ref 32.0–36.0)
MCV: 83.8 fL (ref 80.0–100.0)
Platelets: 270 10*3/uL (ref 150–440)
RBC: 4.62 MIL/uL (ref 3.80–5.20)
RDW: 14.6 % — ABNORMAL HIGH (ref 11.5–14.5)
WBC: 11.8 10*3/uL — ABNORMAL HIGH (ref 3.6–11.0)

## 2017-06-26 LAB — BASIC METABOLIC PANEL
Anion gap: 10 (ref 5–15)
BUN: 10 mg/dL (ref 6–20)
CO2: 24 mmol/L (ref 22–32)
Calcium: 9.5 mg/dL (ref 8.9–10.3)
Chloride: 106 mmol/L (ref 101–111)
Creatinine, Ser: 0.84 mg/dL (ref 0.44–1.00)
GFR calc Af Amer: >60 mL/min (ref >60)
GFR calc non Af Amer: >60 mL/min (ref >60)
Glucose, Bld: 89 mg/dL (ref 65–99)
Potassium: 3.9 mmol/L (ref 3.5–5.1)
Sodium: 140 mmol/L (ref 135–145)

## 2017-06-26 LAB — TROPONIN I
Troponin I: <0.03 ng/mL (ref <0.03)
Troponin I: <0.03 ng/mL (ref <0.03)

## 2017-06-26 LAB — CARBAMAZEPINE LEVEL, TOTAL: Carbamazepine Lvl: <2 ug/mL — ABNORMAL LOW (ref 4.0–12.0)

## 2017-06-26 MED ORDER — SODIUM CHLORIDE 0.9 % IV BOLUS (SEPSIS)
1000.0000 mL | Freq: Once | INTRAVENOUS | Status: AC
  Administered 2017-06-26: 1000 mL via INTRAVENOUS

## 2017-06-26 MED ORDER — DOXYCYCLINE HYCLATE 100 MG PO CAPS: 100.0000 mg | ORAL_CAPSULE | Freq: Two times a day (BID) | ORAL | 0 refills | Status: AC

## 2017-06-26 MED ORDER — DOXYCYCLINE HYCLATE 100 MG PO TABS
100.0000 mg | ORAL_TABLET | Freq: Once | ORAL | Status: AC
  Administered 2017-06-26: 100 mg via ORAL
  Filled 2017-06-26: qty 1

## 2017-06-26 MED ORDER — ALBUTEROL SULFATE (2.5 MG/3ML) 0.083% IN NEBU
2.5000 mg | INHALATION_SOLUTION | Freq: Once | RESPIRATORY_TRACT | Status: AC
  Administered 2017-06-26: 2.5 mg via RESPIRATORY_TRACT
  Filled 2017-06-26: qty 3

## 2017-06-26 MED ORDER — KETOROLAC TROMETHAMINE 30 MG/ML IJ SOLN
30.0000 mg | Freq: Once | INTRAMUSCULAR | Status: AC
  Administered 2017-06-26: 30 mg via INTRAVENOUS
  Filled 2017-06-26: qty 1

## 2017-06-26 MED ORDER — NAPROXEN 500 MG PO TABS: 500.0000 mg | ORAL_TABLET | Freq: Two times a day (BID) | ORAL | 0 refills | Status: DC

## 2017-06-26 MED ORDER — ALBUTEROL SULFATE HFA 108 (90 BASE) MCG/ACT IN AERS: 2.0000 | INHALATION_SPRAY | Freq: Four times a day (QID) | RESPIRATORY_TRACT | 2 refills | Status: DC | PRN

## 2017-06-26 MED ORDER — PREDNISONE 20 MG PO TABS: 60.0000 mg | ORAL_TABLET | Freq: Every day | ORAL | 0 refills | Status: AC

## 2017-06-26 MED ORDER — PREDNISONE 20 MG PO TABS
60.0000 mg | ORAL_TABLET | Freq: Once | ORAL | Status: AC
  Administered 2017-06-26: 60 mg via ORAL
  Filled 2017-06-26: qty 3

## 2017-06-26 NOTE — ED Notes (Signed)
Pt pt ice cream, crackers, and drink. Gave pt family member ice cream and drink. Theodoro Grist ED medic pushed pt family member out in wheelchair.

## 2017-06-26 NOTE — ED Notes (Signed)
Patient returns from X-ray via w/c.  Waiting for lab personnel to draw blood.  No new complaints.

## 2017-06-26 NOTE — ED Triage Notes (Signed)
Pt c/o fatigue with SOB nonproductive cough with body aches and chest pain since last night. States she had pneumonia twice in the past month.

## 2017-06-26 NOTE — ED Provider Notes (Signed)
[facility] Emergency Department Provider Note  ____________________________________________   First MD Initiated Contact with Patient 06/26/17 1308     (approximate)  I have reviewed the triage vital signs and the nursing notes.   HISTORY  Chief Complaint URI and Shortness of Breath   HPI Andrea Miller is a 31 y.o. female with a history of tachycardia who is presenting to the emergency department today with chest pain over the past 2 days as well as cough, chills and syncope. She says the chest pain as a 9 out of 10 and over the left side of her chest. She says it does not worsen with coughing. [place] radiation of the chest pain. Says it is a pressure and sharp quality of pain. The patient says that she has been treated for pneumonia multiple times over the past several months and last completed antibiotics 3 weeks ago when she finished a course of Levaquin. However, she says that she is to stop the Levaquin because of peeling of her skin. She denies any nausea or vomiting. Denies any nasal congestion. Says that she was feeling lightheaded this morning before she syncopized 2-3 times once while standing and then several other times when seated.   Past Medical History:  Diagnosis Date  . Anxiety   . Asthma   . Bipolar disorder (HCC)   . Chest pain    and angina  . Depression   . Fibromyalgia   . Hypertension   . 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 Obesity   . Seizures (HCC)    secondary to anxiety  . SVT (supraventricular tachycardia) (HCC)    with hx of syncope; managed by [facility]    Patient Active Problem List   Diagnosis Date Noted  . Asthma exacerbation 04/14/2017  . Asthma without status asthmaticus 01/16/2017  . Bipolar affective (HCC) 01/16/2017  . Coronary artery disease 01/16/2017  . Fibromyalgia 04/27/2014  . OSA (obstructive sleep apnea) 04/27/2014  . Seizure-like activity (HCC) 04/27/2014    . Obesity 04/15/2014    Past Surgical History:  Procedure Laterality Date  . NO PAST SURGERIES    . TONSILLECTOMY AND ADENOIDECTOMY N/A 09/05/2016   Procedure: TONSILLECTOMY AND ADENOIDECTOMY;  Surgeon: Linus Salmons, MD;  Location: [facility] ORS;  Service: ENT;  Laterality: N/A;    Prior to Admission medications   Medication Sig Start Date End Date Taking? Authorizing Provider  albuterol (PROVENTIL HFA;VENTOLIN HFA) 108 (90 Base) MCG/ACT inhaler Inhale 2 puffs into the lungs every 6 (six) hours as needed for wheezing or shortness of breath. 03/20/17   Nita Sickle, MD  albuterol (PROVENTIL) (2.5 MG/3ML) 0.083% nebulizer solution Take 2.5 mg by nebulization every 4 (four) hours as needed for wheezing or shortness of breath.    [provider]  amphetamine-dextroamphetamine (ADDERALL XR) 15 MG 24 hr capsule Take 15 mg by mouth every morning.    [provider]  azithromycin (ZITHROMAX) 500 MG tablet Take 1 tablet (500 mg total) by mouth daily. 04/16/17   Adrian Saran, MD  beclomethasone (QVAR) 40 MCG/ACT inhaler Inhale 2 puffs into the lungs 2 (two) times daily.    [provider]  BELSOMRA 20 MG TABS Take 20 mg by mouth at bedtime. 02/07/17   [provider]  carbamazepine (TEGRETOL) 200 MG tablet Take 200 mg by mouth daily. 03/26/17   [provider]  Cariprazine HCl (VRAYLAR) 3 MG CAPS Take 1 capsule by mouth at bedtime.  [provider]  clonazePAM (KLONOPIN) 2 MG tablet Take 2 mg by mouth at bedtime as needed for anxiety.    [provider]  diltiazem (CARDIZEM CD) 360 MG 24 hr capsule Take 360 mg by mouth daily.     [provider]  folic acid (FOLVITE) 1 MG tablet Take 1 mg by mouth daily.  12/15/16   [provider]  furosemide (LASIX) 20 MG tablet Take 20 mg by mouth every morning.    [provider]  gabapentin (NEURONTIN) 300 MG capsule Take 300 mg by mouth 3 (three) times daily.    [provider]  guaiFENesin-codeine 100-10 MG/5ML syrup Take 5 mLs by mouth every 6 (six) hours as needed for cough. Patient not taking: Reported on 04/14/2017 03/31/17   Minna Antis, MD  hydrochlorothiazide (HYDRODIURIL) 25 MG tablet Take 25 mg by mouth daily. 03/03/17   [provider]  ibuprofen (ADVIL,MOTRIN) 600 MG tablet Take 1 tablet (600 mg total) by mouth every 6 (six) hours as needed. Patient not taking: Reported on 04/14/2017 12/22/16   Joni Reining, PA-C  metoprolol succinate (TOPROL-XL) 100 MG 24 hr tablet Take 100 mg by mouth 2 (two) times daily. 03/26/17   [provider]  orlistat (XENICAL) 120 MG capsule Take 120 mg by mouth 3 (three) times daily with meals.    [provider]  potassium chloride (K-DUR) 10 MEQ tablet Take 10 mEq by mouth every morning.    [provider]  predniSONE (DELTASONE) 50 MG tablet Take 1 tablet daily for 3 days then stop 04/16/17   Adrian Saran, MD  tiZANidine (ZANAFLEX) 4 MG tablet Take 4 mg by mouth 3 (three) times daily as needed. 03/26/17   [provider]    Allergies Cortizone-10 [hydrocortisone]; Garlic; Penicillins; and Corticosteroids  Family History  Problem Relation Age of Onset  . Diabetes Mother   . Hypertension Mother   . Asthma Mother   . Diabetes Father   . Hypertension Father     Social History Social History  Substance Use Topics  . Smoking status: Never Smoker  . Smokeless tobacco: Never Used  . Alcohol use No    Review of Systems  Constitutional: chills Eyes: No visual changes. ENT: No sore throat. Cardiovascular: As above Respiratory: As above Gastrointestinal: No abdominal pain.  No nausea, no vomiting.  No diarrhea.  No constipation. Genitourinary: Negative for dysuria. Musculoskeletal: Negative for back pain. Skin: Negative for rash. Neurological: Negative for headaches, focal weakness or numbness.   ____________________________________________   PHYSICAL  EXAM:  VITAL SIGNS: ED Triage Vitals [06/26/17 1007]  Enc Vitals Group     BP (!) 144/90     Pulse Rate (!) 103     Resp 18     Temp 100.2 F (37.9 C)     Temp Source Oral     SpO2 97 %     Weight (!) 352 lb (159.7 kg)     Height 5\' 10"  (1.778 m)     Head Circumference      Peak Flow      Pain Score 10     Pain Loc      Pain Edu?      Excl. in GC?     Constitutional: Alert and oriented. Well appearing and in no acute distress. Eyes: Conjunctivae are normal.  Head: Atraumatic. Nose: No congestion/rhinnorhea. Mouth/Throat: Mucous membranes are moist.  Neck: No stridor.   Cardiovascular: Normal rate, regular rhythm. Grossly normal  heart sounds.  With equal bilateral radial pulses. Chest pain is reproducible with palpation over the left sternal border. Respiratory: Normal respiratory effort.  No retractions. Lungs CTAB. Patient with otherwise very coughed several times during the examination. Gastrointestinal: Soft and nontender. No distention.  Musculoskeletal: No lower extremity tenderness nor edema.  No joint effusions. Neurologic:  Normal speech and language. No gross focal neurologic deficits are appreciated. Skin:  Skin is warm, dry and intact. No rash noted. Psychiatric: Mood and affect are normal. Speech and behavior are normal.  ____________________________________________   LABS (all labs ordered are listed, but only abnormal results are displayed)  Labs Reviewed  CBC - Abnormal; Notable for the following:       Result Value   WBC 11.8 (*)    RDW 14.6 (*)    All other components within normal limits  CULTURE, BLOOD (ROUTINE X 2)  CULTURE, BLOOD (ROUTINE X 2)  BASIC METABOLIC PANEL  TROPONIN I  CARBAMAZEPINE LEVEL, TOTAL  TROPONIN I   ____________________________________________  EKG ED ECG REPORT I, Arelia Longest, the attending physician, personally viewed and interpreted this ECG.   Date: 06/26/2017  EKG Time: 1017  Rate: 100  Rhythm: normal  sinus rhythm  Axis: Normal  Intervals:none  ST&T Change: No ST segment elevation or depression. No abnormal T-wave inversion.  ____________________________________________  RADIOLOGY  Low lung volumes with bibasilar atelectasis. ____________________________________________   PROCEDURES  Procedure(s) performed:   Procedures  Critical Care performed:   ____________________________________________   INITIAL IMPRESSION / ASSESSMENT AND PLAN / ED COURSE  Pertinent labs & imaging results that were available during my care of the patient were reviewed by me and considered in my medical decision making (see chart for details).  ----------------------------------------- 3:23 PM on 06/26/2017 -----------------------------------------  Patient with mildly elevated white blood cell count and borderline fever on initial vital signs. Patient appears to be at her baseline tachycardia. However, repeat temp has decreased. Patient given symptomatic treatment. Most recent CTA was in early July and did not show a pulmonary embolus. Likely persistent tachycardia which the patient has been diagnosed with before. Single episodes likely from dehydration in the context of the patient with a borderline fever and possible recurrent pulmonary infection. Patient pending a repeat troponin. Signed out to Dr. Don Perking.       ____________________________________________   FINAL CLINICAL IMPRESSION(S) / ED DIAGNOSES  Chest pain. Cough. Shortness of breath.    NEW MEDICATIONS STARTED DURING THIS VISIT:  New Prescriptions   No medications on file     Note:  This document was prepared using Dragon voice recognition software and may include unintentional dictation errors.     Myrna Blazer, MD 06/26/17 [phone]

## 2017-06-26 NOTE — ED Notes (Signed)
Patient placed in WR in WC.  Given mask to wear and box of tissues.  No new complaints - patient is aware she is waiting for lab.

## 2017-06-26 NOTE — ED Notes (Addendum)
Lab to triage to draw blood. Several attempts, labwork collected and 1 set of blood cultures obtained.

## 2017-06-26 NOTE — ED Notes (Signed)
IV team at bedside 

## 2017-06-26 NOTE — ED Provider Notes (Signed)
-----------------------------------------   4:50 PM on 06/26/2017 -----------------------------------------   Blood pressure (!) 134/119, pulse (!) 105, temperature 99.6 F (37.6 C), temperature source Oral, resp. rate (!) 26, height 5\' 10"  (1.778 m), weight (!) 159.7 kg (352 lb), last menstrual period 05/26/2017, SpO2 100 %.  Assuming care from Dr. Pershing Proud of Andrea Miller is a 31 y.o. female with a chief complaint of URI and Shortness of Breath .    Please refer to H&P by previous MD for further details.  The current plan of care is to follow up 2nd troponin and reassess patient. Patient complaining of diffuse body aches, low-grade fever, and URI symptoms. Blood work showing mild leukocytosis. Patient with a low-grade temp here which defervesced prior to any antipyretics. Lungs are clear with no crackles or wheezing. Patient remains tachycardic and tells me that she has persistent tachycardia for which she is followed by cardiology a [facility]. She is supposed to be on metoprolol and Cardizem however she tells me that these medications don't work and she hasn't been taking them. She is currently awaiting insurance approval for a different medication that her cardiologist wants her to be on. She remains extremely well appearing. She is going to be discharged home on doxycycline, prednisone, albuterol, naproxen per Dr. Langston Masker plan. Recommend close follow-up with her primary care doctor in 1-2 days. Discussed return precautions including worsening fever, shortness of breath, chest pain and recommended she returns if these develop.   Nita Sickle, MD 06/26/17 [phone]

## 2017-06-26 NOTE — ED Notes (Addendum)
Pt states she has been having SOB, cough x 3-4 days. States was in [facility] and ended up with pneumonia. States finished antibiotics for that. States then got pneumonia again and was taking antibiotics. States last dose 3 weeks ago. States broke out from those antibiotics and was placed on something to help with skin rash, ended that medication 1 week ago. Pt has mask over face. No distress noted at this time. States hx asthma, uses inhaler and nebulizer at home.   Pt also c/o of dental pain to R side- states R sided facial pressure. Speech clear, speaking in complete sentences.

## 2017-06-26 NOTE — Discharge Instructions (Signed)

## 2017-06-26 NOTE — ED Notes (Signed)
Contacted lab for collection of tubes. This RN attempted twice.

## 2017-06-26 NOTE — ED Notes (Signed)
Family member given ice cream and graham crackers.

## 2017-06-26 NOTE — ED Notes (Signed)
Called lab again for blood draw.

## 2017-06-26 NOTE — ED Notes (Signed)
This RN did not find any veins on pt that this RN felt comfortable sticking for second set of cultures.

## 2017-06-26 NOTE — ED Notes (Signed)
Dr. Veronese at bedside 

## 2017-07-01 LAB — CULTURE, BLOOD (ROUTINE X 2)
Culture: NO GROWTH
Culture: NO GROWTH
Special Requests: ADEQUATE
Special Requests: ADEQUATE

## 2017-07-11 ENCOUNTER — Ambulatory Visit: Payer: Self-pay | Admitting: Cardiovascular Disease

## 2017-07-16 NOTE — Progress Notes (Deleted)
[facility] Pulmonary Medicine Consultation      Assessment and Plan:     Date: 07/16/2017  MRN# [id number] Andrea Miller [birth year]  Referring Physician:   [name] is a 31 y.o. old female seen in consultation for chief complaint of:   No chief complaint on file.   HPI:   31 year old female with a history of fibromyalgia, migraines, sleep apnea, pseudoseizures and sinus tachycardia, she was admitted to the hospital in May 2018 for 2 days with asthma exacerbation. She was seen in the ED on 06/26/2017 with symptoms of tachycardia and possible URTI.  I personally reviewed imaging, chest x-ray 06/26/2017 and CT in June for 2418; these appear to show strandy bibasilar atelectasis, reduced lung volumes, likely due to obesity.   PMHX:   Past Medical History:  Diagnosis Date  . Anxiety   . Asthma   . Bipolar disorder (HCC)   . Chest pain    and angina  . Depression   . Fibromyalgia   . Hypertension   . 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 Obesity   . Seizures (HCC)    secondary to anxiety  . SVT (supraventricular tachycardia) (HCC)    with hx of syncope; managed by [facility]   Surgical Hx:  Past Surgical History:  Procedure Laterality Date  . NO PAST SURGERIES    . TONSILLECTOMY AND ADENOIDECTOMY N/A 09/05/2016   Procedure: TONSILLECTOMY AND ADENOIDECTOMY;  Surgeon: Linus Salmons, MD;  Location: [facility] ORS;  Service: ENT;  Laterality: N/A;   Family Hx:  Family History  Problem Relation Age of Onset  . Diabetes Mother   . Hypertension Mother   . Asthma Mother   . Diabetes Father   . Hypertension Father    Social Hx:   Social History  Substance Use Topics  . Smoking status: Never Smoker  . Smokeless tobacco: Never Used  . Alcohol use No   Medication:    Current Outpatient Prescriptions:  .  albuterol (PROVENTIL HFA;VENTOLIN HFA) 108 (90 Base) MCG/ACT inhaler, Inhale 2 puffs into the lungs every 6 (six) hours as  needed for wheezing or shortness of breath., Disp: 1 Inhaler, Rfl: 2 .  albuterol (PROVENTIL HFA;VENTOLIN HFA) 108 (90 Base) MCG/ACT inhaler, Inhale 2 puffs into the lungs every 6 (six) hours as needed for wheezing or shortness of breath., Disp: 1 Inhaler, Rfl: 2 .  albuterol (PROVENTIL) (2.5 MG/3ML) 0.083% nebulizer solution, Take 2.5 mg by nebulization every 4 (four) hours as needed for wheezing or shortness of breath., Disp: , Rfl:  .  amphetamine-dextroamphetamine (ADDERALL XR) 15 MG 24 hr capsule, Take 15 mg by mouth every morning., Disp: , Rfl:  .  azithromycin (ZITHROMAX) 500 MG tablet, Take 1 tablet (500 mg total) by mouth daily., Disp: 3 tablet, Rfl: 0 .  beclomethasone (QVAR) 40 MCG/ACT inhaler, Inhale 2 puffs into the lungs 2 (two) times daily., Disp: , Rfl:  .  BELSOMRA 20 MG TABS, Take 20 mg by mouth at bedtime., Disp: , Rfl: 0 .  carbamazepine (TEGRETOL) 200 MG tablet, Take 200 mg by mouth daily., Disp: , Rfl: 1 .  Cariprazine HCl (VRAYLAR) 3 MG CAPS, Take 1 capsule by mouth at bedtime. , Disp: , Rfl:  .  clonazePAM (KLONOPIN) 2 MG tablet, Take 2 mg by mouth at bedtime as needed for anxiety., Disp: , Rfl:  .  diltiazem (CARDIZEM CD) 360 MG 24 hr capsule, Take 360 mg by mouth daily. ,  Disp: , Rfl:  .  folic acid (FOLVITE) 1 MG tablet, Take 1 mg by mouth daily. , Disp: , Rfl: 10 .  furosemide (LASIX) 20 MG tablet, Take 20 mg by mouth every morning., Disp: , Rfl:  .  gabapentin (NEURONTIN) 300 MG capsule, Take 300 mg by mouth 3 (three) times daily., Disp: , Rfl:  .  guaiFENesin-codeine 100-10 MG/5ML syrup, Take 5 mLs by mouth every 6 (six) hours as needed for cough. (Patient not taking: Reported on 04/14/2017), Disp: 120 mL, Rfl: 0 .  hydrochlorothiazide (HYDRODIURIL) 25 MG tablet, Take 25 mg by mouth daily., Disp: , Rfl: 4 .  ibuprofen (ADVIL,MOTRIN) 600 MG tablet, Take 1 tablet (600 mg total) by mouth every 6 (six) hours as needed. (Patient not taking: Reported on 04/14/2017), Disp: 30  tablet, Rfl: 0 .  metoprolol succinate (TOPROL-XL) 100 MG 24 hr tablet, Take 100 mg by mouth 2 (two) times daily., Disp: , Rfl: 4 .  naproxen (NAPROSYN) 500 MG tablet, Take 1 tablet (500 mg total) by mouth 2 (two) times daily with a meal., Disp: 20 tablet, Rfl: 0 .  orlistat (XENICAL) 120 MG capsule, Take 120 mg by mouth 3 (three) times daily with meals., Disp: , Rfl:  .  potassium chloride (K-DUR) 10 MEQ tablet, Take 10 mEq by mouth every morning., Disp: , Rfl:  .  tiZANidine (ZANAFLEX) 4 MG tablet, Take 4 mg by mouth 3 (three) times daily as needed., Disp: , Rfl: 1   Allergies:  Cortizone-10 [hydrocortisone]; Garlic; Penicillins; and Corticosteroids  Review of Systems: Gen:  Denies  fever, sweats, chills HEENT: Denies blurred vision, double vision. bleeds, sore throat Cvc:  No dizziness, chest pain. Resp:   Denies cough or sputum production, shortness of breath Gi: Denies swallowing difficulty, stomach pain. Gu:  Denies bladder incontinence, burning urine Ext:   No Joint pain, stiffness. Skin: No skin rash,  hives  Endoc:  No polyuria, polydipsia. Psych: No depression, insomnia. Other:  All other systems were reviewed with the patient and were negative other that what is mentioned in the HPI.   Physical Examination:   VS: There were no vitals taken for this visit.  General Appearance: No distress  Neuro:without focal findings,  speech normal,  HEENT: PERRLA, EOM intact.   Pulmonary: normal breath sounds, No wheezing.  CardiovascularNormal S1,S2.  No m/r/g.   Abdomen: Benign, Soft, non-tender. Renal:  No costovertebral tenderness  GU:  No performed at this time. Endoc: No evident thyromegaly, no signs of acromegaly. Skin:   warm, no rashes, no ecchymosis  Extremities: normal, no cyanosis, clubbing.  Other findings:    LABORATORY PANEL:   CBC No results for input(s): WBC, HGB, HCT, PLT in the last 168  hours. ------------------------------------------------------------------------------------------------------------------  Chemistries  No results for input(s): NA, K, CL, CO2, GLUCOSE, BUN, CREATININE, CALCIUM, MG, AST, ALT, ALKPHOS, BILITOT in the last 168 hours.  Invalid input(s): GFRCGP ------------------------------------------------------------------------------------------------------------------  Cardiac Enzymes No results for input(s): TROPONINI in the last 168 hours. ------------------------------------------------------------  RADIOLOGY:  No results found.     Thank  you for the consultation and for allowing [facility] to assist in the care of your patient. Our recommendations are noted above.  Please contact us if we can be of further service.   Wells Guiles, MD.  Board Certified in Internal Medicine, Pulmonary Medicine, Critical Care Medicine, and Sleep Medicine.  [place] [facility] Office Number: [phone]  Santiago Glad, M.D.  Billy Fischer,  M.D  07/16/2017

## 2017-07-23 ENCOUNTER — Encounter: Payer: Self-pay | Admitting: Pulmonary Disease

## 2017-07-23 ENCOUNTER — Ambulatory Visit (INDEPENDENT_AMBULATORY_CARE_PROVIDER_SITE_OTHER): Payer: BLUE CROSS/BLUE SHIELD | Admitting: Pulmonary Disease

## 2017-07-23 ENCOUNTER — Institutional Professional Consult (permissible substitution): Payer: Self-pay | Admitting: Internal Medicine

## 2017-07-23 DIAGNOSIS — Z8709 Personal history of other diseases of the respiratory system: Secondary | ICD-10-CM | POA: Diagnosis not present

## 2017-07-23 DIAGNOSIS — R0609 Other forms of dyspnea: Secondary | ICD-10-CM | POA: Diagnosis not present

## 2017-07-23 DIAGNOSIS — R0600 Dyspnea, unspecified: Secondary | ICD-10-CM

## 2017-07-23 MED ORDER — ALBUTEROL SULFATE HFA 108 (90 BASE) MCG/ACT IN AERS: 2.0000 | INHALATION_SPRAY | Freq: Four times a day (QID) | RESPIRATORY_TRACT | 2 refills | Status: DC | PRN

## 2017-07-23 MED ORDER — BUDESONIDE-FORMOTEROL FUMARATE 160-4.5 MCG/ACT IN AERO
2.0000 | INHALATION_SPRAY | Freq: Two times a day (BID) | RESPIRATORY_TRACT | 3 refills | Status: DC
Start: 2017-07-23 — End: 2017-09-25

## 2017-07-23 NOTE — Patient Instructions (Signed)
Continue Symbicort Continue albuterol (Ventolin) as needed Full pulmonary function tests ordered Follow-up in 4-6 weeks

## 2017-07-23 NOTE — Progress Notes (Signed)
PULMONARY CONSULT NOTE  Requesting MD/Service: self Date of initial consultation: 07/23/17 Reason for consultation: asthma  PT PROFILE: 31 y.o. female never smoker with multiple medical problems including asthma, OSA and morbid obesity referred after hospitalization for asthma exacerbation   DATA: 03/20/17 CT chest: low volumes, mild atelectasis, no infiltrates or edema 04/15/17 Echocardiogram: normal LVEF, no RV or RA dilation. RVSP not estimated  HPI:  As above. She has been hospitalized twice since 02/2017, once in [place] and once @ [facility], for asthma exacerbations. Her asthma was diagnosed many years ago. Prior to that she suffered frequent "bronchitis". She is maintained on Symbicort and albuterol (MDI and nebs). She indicates that the albuterol provides some relief when used for SOB. She also suffers from tachycardia for which she is on metoprolol and diltiazem. Her SOB is "real bad" at the time of this evaluation though she appears in no distress. She does report DTD variation in her symptoms. She is profoundly sedentary due to morbid obesity. She is unable to define clear exacerbating factors. She reports nonspecific, nonexertional CP but otherwise denies fever, purulent sputum, hemoptysis, LE edema and calf tenderness.  Past Medical History:  Diagnosis Date  . Anxiety   . Asthma   . Bipolar disorder (HCC)   . Chest pain    and angina  . Depression   . Fibromyalgia   . Hypertension   . 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 Obesity   . Seizures (HCC)    secondary to anxiety  . SVT (supraventricular tachycardia) (HCC)    with hx of syncope; managed by [facility]    Past Surgical History:  Procedure Laterality Date  . NO PAST SURGERIES    . TONSILLECTOMY AND ADENOIDECTOMY N/A 09/05/2016   Procedure: TONSILLECTOMY AND ADENOIDECTOMY;  Surgeon: Linus Salmons, MD;  Location: [facility] ORS;  Service: ENT;  Laterality: N/A;    MEDICATIONS: I have  reviewed all medications and confirmed regimen as documented  Social History   Social History  . Marital status: Divorced    Spouse name: N/A  . Number of children: N/A  . Years of education: N/A   Occupational History  . Not on file.   Social History Main Topics  . Smoking status: Never Smoker  . Smokeless tobacco: Never Used  . Alcohol use No  . Drug use: No  . Sexual activity: Yes    Birth control/ protection: None   Other Topics Concern  . Not on file   Social History Narrative  . No narrative on file    Family History  Problem Relation Age of Onset  . Diabetes Mother   . Hypertension Mother   . Asthma Mother   . Diabetes Father   . Hypertension Father     ROS: No fever, myalgias/arthralgias, unexplained weight loss or weight gain No new focal weakness or sensory deficits No otalgia, hearing loss, visual changes, nasal and sinus symptoms, mouth and throat problems No neck pain or adenopathy No abdominal pain, N/V/D, diarrhea, change in bowel pattern No dysuria, change in urinary pattern   Vitals:   07/23/17 1421 07/23/17 1430  BP:  120/76  Pulse:  (!) 102  Resp: 16   SpO2:  99%  Weight: (!) 167.4 kg (369 lb)   Height: 5\' 10"  (1.778 m)      EXAM:  Gen: Very obese, in WC, somewhat avoidant, NAD HEENT: NCAT, sclera white, oropharynx normal Neck: Supple without LAN, thyromegaly, JVP cannot be  visualized Lungs: Full BS, no wheezes or other adventitious sounds Cardiovascular: RRR, no murmurs noted Abdomen: Soft, nontender, normal BS Ext: trace symmetric edema without clubbing, cyanosis Neuro: CNs grossly intact, motor and sensory intact Skin: Limited exam, no lesions noted  DATA:   BMP Latest Ref Rng & Units 06/26/2017 04/16/2017 04/15/2017  Glucose 65 - 99 mg/dL 89 161(W) 960(A)  BUN 6 - 20 mg/dL 10 6 6   Creatinine 0.44 - 1.00 mg/dL 5.40 9.81 1.91  Sodium 135 - 145 mmol/L 140 135 139  Potassium 3.5 - 5.1 mmol/L 3.9 3.3(L) 4.5  Chloride 101 - 111  mmol/L 106 99(L) 105  CO2 22 - 32 mmol/L 24 29 27   Calcium 8.9 - 10.3 mg/dL 9.5 9.3 9.4    CBC Latest Ref Rng & Units 06/26/2017 04/15/2017 04/14/2017  WBC 3.6 - 11.0 K/uL 11.8(H) 9.5 9.1  Hemoglobin 12.0 - 16.0 g/dL 47.8 29.5 62.1  Hematocrit 35.0 - 47.0 % 38.7 36.5 37.3  Platelets 150 - 440 K/uL 270 254 254    CXR 06/26/17:  Very low volumes with basilar atx  IMPRESSION:     ICD-10-CM   1. Morbid obesity E66.01   2. DOE (dyspnea on exertion) R06.09 Pulmonary Function Test [facility] Only  3. History of asthma Z87.09 Pulmonary Function Test [facility] Only   Her history does suggest asthma but I suspect that much of her dyspnea is due to very severe obesity and deconditioning.   PLAN:  Continue Symbicort Continue albuterol (Ventolin) as needed Full pulmonary function tests ordered  We discussed my impression that most of her medical problems including her respiratory symptoms are caused by or exacerbated by obesity and that she will always be disappointed in the results of her medical care if this problem is not aggressively addressed  Follow-up in 4-6 weeks   Billy Fischer, MD PCCM service Mobile [phone] Pager [phone] 07/26/2017 3:26 AM

## 2017-07-26 ENCOUNTER — Telehealth: Payer: Self-pay | Admitting: Pulmonary Disease

## 2017-07-26 NOTE — Telephone Encounter (Signed)
Pt calling stating we called in the wrong inhaler to pharmacy She picked up [employer]  States it was not to be this one  Please call back

## 2017-07-26 NOTE — Telephone Encounter (Signed)
Informed pt I spoke with pharmacy and they state Ventolin had been filled don 06/26/17 and that insurance allowed Proair to be filled. Pt states she likes the Proair better and ask if she could take it back to the [facility]. I informed pt she would have to contact the pharmacy and discuss that with them. Pt verbalized understanding. Nothing further needed.

## 2017-08-08 ENCOUNTER — Ambulatory Visit: Payer: Self-pay | Admitting: Cardiovascular Disease

## 2017-08-30 ENCOUNTER — Ambulatory Visit: Payer: BLUE CROSS/BLUE SHIELD | Attending: Pulmonary Disease

## 2017-08-30 ENCOUNTER — Other Ambulatory Visit
Admission: RE | Admit: 2017-08-30 | Discharge: 2017-08-30 | Disposition: A | Discharge status: Home / Self Care | Payer: BLUE CROSS/BLUE SHIELD | Source: Ambulatory Visit | Attending: Cardiovascular Disease | Admitting: Cardiovascular Disease

## 2017-08-30 DIAGNOSIS — Z8709 Personal history of other diseases of the respiratory system: Secondary | ICD-10-CM

## 2017-08-30 DIAGNOSIS — R000 Tachycardia, unspecified: Secondary | ICD-10-CM | POA: Insufficient documentation

## 2017-08-30 DIAGNOSIS — J45909 Unspecified asthma, uncomplicated: Secondary | ICD-10-CM | POA: Insufficient documentation

## 2017-08-30 DIAGNOSIS — R0609 Other forms of dyspnea: Secondary | ICD-10-CM | POA: Diagnosis present

## 2017-08-30 DIAGNOSIS — R0600 Dyspnea, unspecified: Secondary | ICD-10-CM

## 2017-08-30 LAB — CBC WITH DIFFERENTIAL/PLATELET
Basophils Absolute: 0 10*3/uL (ref 0–0.1)
Basophils Relative: 0 %
Eosinophils Absolute: 0 10*3/uL (ref 0–0.7)
Eosinophils Relative: 1 %
HCT: 38.5 % (ref 35.0–47.0)
Hemoglobin: 12.7 g/dL (ref 12.0–16.0)
Lymphocytes Relative: 34 %
Lymphs Abs: 1.3 10*3/uL (ref 1.0–3.6)
MCH: 27.5 pg (ref 26.0–34.0)
MCHC: 32.9 g/dL (ref 32.0–36.0)
MCV: 83.6 fL (ref 80.0–100.0)
Monocytes Absolute: 0.3 10*3/uL (ref 0.2–0.9)
Monocytes Relative: 9 %
Neutro Abs: 2.1 10*3/uL (ref 1.4–6.5)
Neutrophils Relative %: 56 %
Platelets: 260 10*3/uL (ref 150–440)
RBC: 4.61 MIL/uL (ref 3.80–5.20)
RDW: 14 % (ref 11.5–14.5)
WBC: 3.8 10*3/uL (ref 3.6–11.0)

## 2017-08-30 LAB — TSH: TSH: 0.56 u[IU]/mL (ref 0.350–4.500)

## 2017-08-30 LAB — COMPREHENSIVE METABOLIC PANEL
ALT: 10 U/L — ABNORMAL LOW (ref 14–54)
AST: 18 U/L (ref 15–41)
Albumin: 4.3 g/dL (ref 3.5–5.0)
Alkaline Phosphatase: 80 U/L (ref 38–126)
Anion gap: 12 (ref 5–15)
BUN: 7 mg/dL (ref 6–20)
CO2: 26 mmol/L (ref 22–32)
Calcium: 9.3 mg/dL (ref 8.9–10.3)
Chloride: 103 mmol/L (ref 101–111)
Creatinine, Ser: 0.67 mg/dL (ref 0.44–1.00)
GFR calc Af Amer: >60 mL/min (ref >60)
GFR calc non Af Amer: >60 mL/min (ref >60)
Glucose, Bld: 97 mg/dL (ref 65–99)
Potassium: 3.1 mmol/L — ABNORMAL LOW (ref 3.5–5.1)
Sodium: 141 mmol/L (ref 135–145)
Total Bilirubin: 0.6 mg/dL (ref 0.3–1.2)
Total Protein: 7.9 g/dL (ref 6.5–8.1)

## 2017-08-30 LAB — T4, FREE: Free T4: 0.82 ng/dL (ref 0.61–1.12)

## 2017-08-30 LAB — LIPID PANEL
Cholesterol: 226 mg/dL — ABNORMAL HIGH (ref 0–200)
HDL: 72 mg/dL (ref >40)
LDL Cholesterol: 138 mg/dL — ABNORMAL HIGH (ref 0–99)
Total CHOL/HDL Ratio: 3.1 RATIO
Triglycerides: 80 mg/dL (ref <150)
VLDL: 16 mg/dL (ref 0–40)

## 2017-08-30 MED ORDER — ALBUTEROL SULFATE (2.5 MG/3ML) 0.083% IN NEBU
2.5000 mg | INHALATION_SOLUTION | Freq: Once | RESPIRATORY_TRACT | Status: AC
  Administered 2017-08-30: 2.5 mg via RESPIRATORY_TRACT
  Filled 2017-08-30: qty 3

## 2017-08-31 LAB — T3, FREE: T3, Free: 3.4 pg/mL (ref 2.0–4.4)

## 2017-09-03 ENCOUNTER — Ambulatory Visit (INDEPENDENT_AMBULATORY_CARE_PROVIDER_SITE_OTHER): Payer: BLUE CROSS/BLUE SHIELD | Admitting: Pulmonary Disease

## 2017-09-03 VITALS — BP 128/86 | HR 97 | Resp 16 | Ht 70.0 in | Wt 366.0 lb

## 2017-09-03 DIAGNOSIS — R0609 Other forms of dyspnea: Secondary | ICD-10-CM | POA: Diagnosis not present

## 2017-09-03 DIAGNOSIS — J4540 Moderate persistent asthma, uncomplicated: Secondary | ICD-10-CM | POA: Diagnosis not present

## 2017-09-03 DIAGNOSIS — R0600 Dyspnea, unspecified: Secondary | ICD-10-CM

## 2017-09-03 MED ORDER — MONTELUKAST SODIUM 10 MG PO TABS: 10.0000 mg | ORAL_TABLET | Freq: Every day | ORAL | 10 refills | Status: DC

## 2017-09-03 NOTE — Progress Notes (Signed)
PULMONARY OFFICE FOLLOW UP NOTE  Requesting MD/Service: self Date of initial consultation: 07/23/17 Reason for consultation: asthma  PT PROFILE: 31 y.o. female never smoker with multiple medical problems including asthma, OSA and morbid obesity referred after hospitalization for asthma exacerbation   DATA: 03/20/17 CT chest: low volumes, mild atelectasis, no infiltrates or edema 04/15/17 Echocardiogram: normal LVEF, no RV or RA dilation. RVSP not estimated 08/30/17 PFTs: Poor quality test (poor reproducibility) with no definite obstruction on spirometry. However, there was significant improvement in both FEV1 and FVC after bronchodilator therapy. Lung volumes consistent with moderate to severe restriction. She could not perform the DLCO maneuver  SUBJ: Overall little change. She is here to review the results of Pulmicort function tests. She remains on Symbicort inhaler, using it twice a day. She continues to use her albuterol inhaler (Ventolin) 4 times a day and believes that she benefits from this. She has no new complaints.   Vitals:   09/03/17 1340 09/03/17 1342  BP:  128/86  Pulse:  97  Resp: 16   SpO2:  100%  Weight: (!) 166 kg (366 lb)   Height: 5\' 10"  (1.778 m)      EXAM:  Gen: Obese, NAD HEENT: WNL Lungs: Full BS, no wheezes or other adventitious sounds Cardiovascular: RRR, no murmurs noted Abdomen: Soft, nontender, normal BS Ext: No edema, clubbing, cyanosis Neuro: No focal deficits  DATA:   BMP Latest Ref Rng & Units 08/30/2017 06/26/2017 04/16/2017  Glucose 65 - 99 mg/dL 97 89 758(I)  BUN 6 - 20 mg/dL 7 10 6   Creatinine 0.44 - 1.00 mg/dL 3.25 4.98 2.64  Sodium 135 - 145 mmol/L 141 140 135  Potassium 3.5 - 5.1 mmol/L 3.1(L) 3.9 3.3(L)  Chloride 101 - 111 mmol/L 103 106 99(L)  CO2 22 - 32 mmol/L 26 24 29   Calcium 8.9 - 10.3 mg/dL 9.3 9.5 9.3    CBC Latest Ref Rng & Units 08/30/2017 06/26/2017 04/15/2017  WBC 3.6 - 11.0 K/uL 3.8 11.8(H) 9.5  Hemoglobin 12.0 -  16.0 g/dL 15.8 30.9 40.7  Hematocrit 35.0 - 47.0 % 38.5 38.7 36.5  Platelets 150 - 440 K/uL 260 270 254    CXR:  No new film PFTs: As above   IMPRESSION:     ICD-10-CM   1. Moderate persistent asthma without complication J45.40   2. Severe obesity (BMI >= 40) (HCC) E66.01   3. DOE (dyspnea on exertion) R06.09    Although the PFTs are not definitive for the diagnosis of asthma, the apparent improvement after bronchodilator therapy is substantial. This might simply represent an improvement in technique. I continue to believe that her major impairment is obesity but she reports that she gets substantial benefit from albuterol inhaler. Therefore, it is reasonable for [place] to assume that there is a component of asthma.  PLAN:  Continue Symbicort inhaler Add montelukast 10 mg by mouth qhs Continue albuterol (Ventolin) as needed We again discussed the role of obesity as a contributor to her overall dyspnea as well as a major health risk factor. We discussed dietary strategies for weight loss.  Follow-up in 3-4 months with target weight of 350 #   Billy Fischer, MD PCCM service Mobile [phone] Pager [phone] 09/03/2017 4:52 PM

## 2017-09-03 NOTE — Patient Instructions (Signed)
Continue Symbicort inhaler twice a day as previously prescribed  Add Singulair (montelukast) 10 mg - take at bedtime  Continue albuterol (Ventolin) as needed  Continue your excellent efforts at weight loss  Follow-up in 3-4 months

## 2017-09-18 IMAGING — CR DG CHEST 2V
1 series · 3 of 3 positions shown · non-contrast
Comparison: 03/31/2017

CLINICAL DATA: Pt c/o cough, shortness of breath, and chest pain.
Pt states was treated for pneumonia a few weeks ago, states feels
like her symptoms have no resolved. Pt presents via [facility], per EMS
RA sat 88%, on arrival to ED O2 saturation 100% on RA. EMS reports
that per patient last asthma attack was this AM. Pt states she is
likely to "passout" if standing, hx of tachycardia. Images done with
pt sitting in w/c

EXAM:
CHEST  2 VIEW

[Series 1: dg chest 2 view · 0.14mm/px · 3 of 3 slices shown]
[im 1/3]
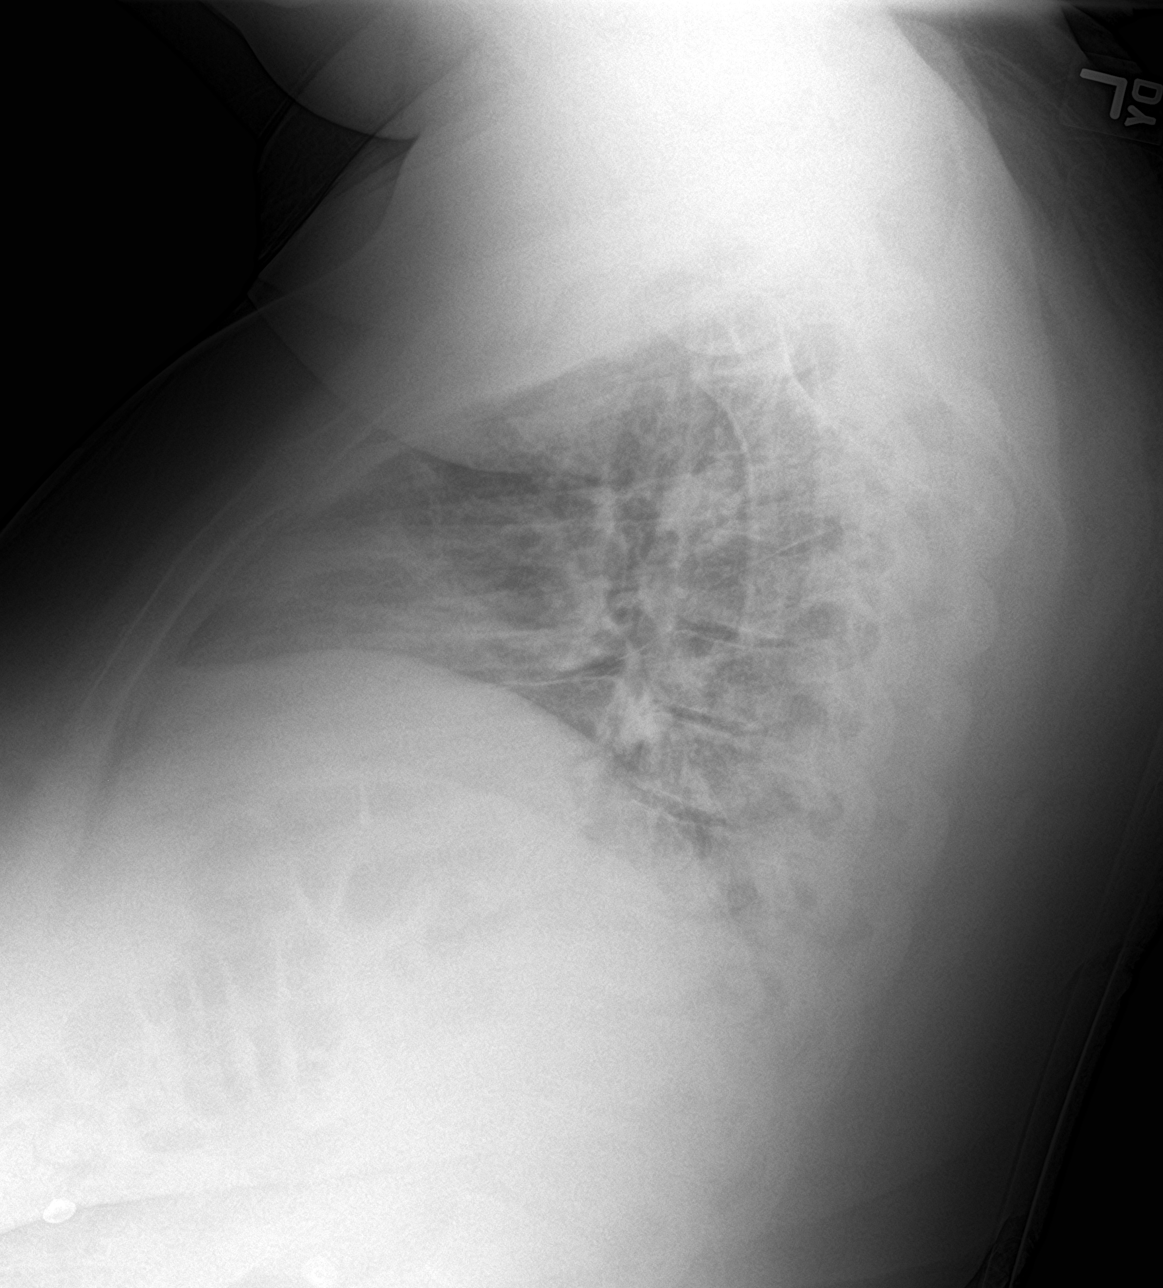
[im 2/3]
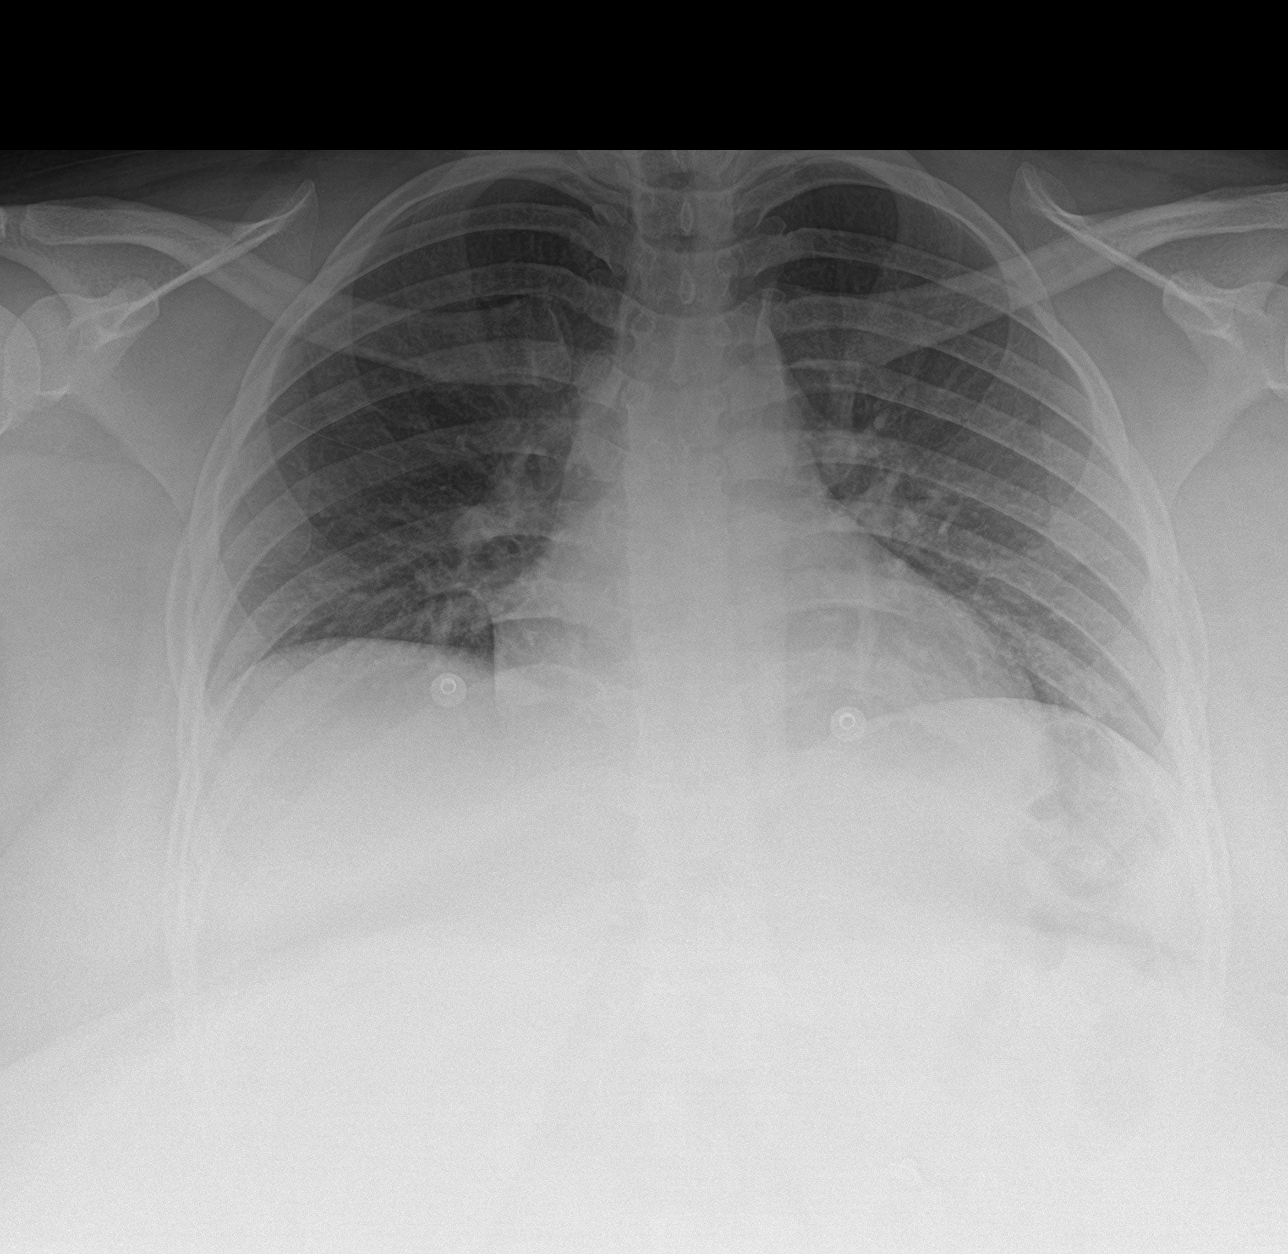
[im 3/3]
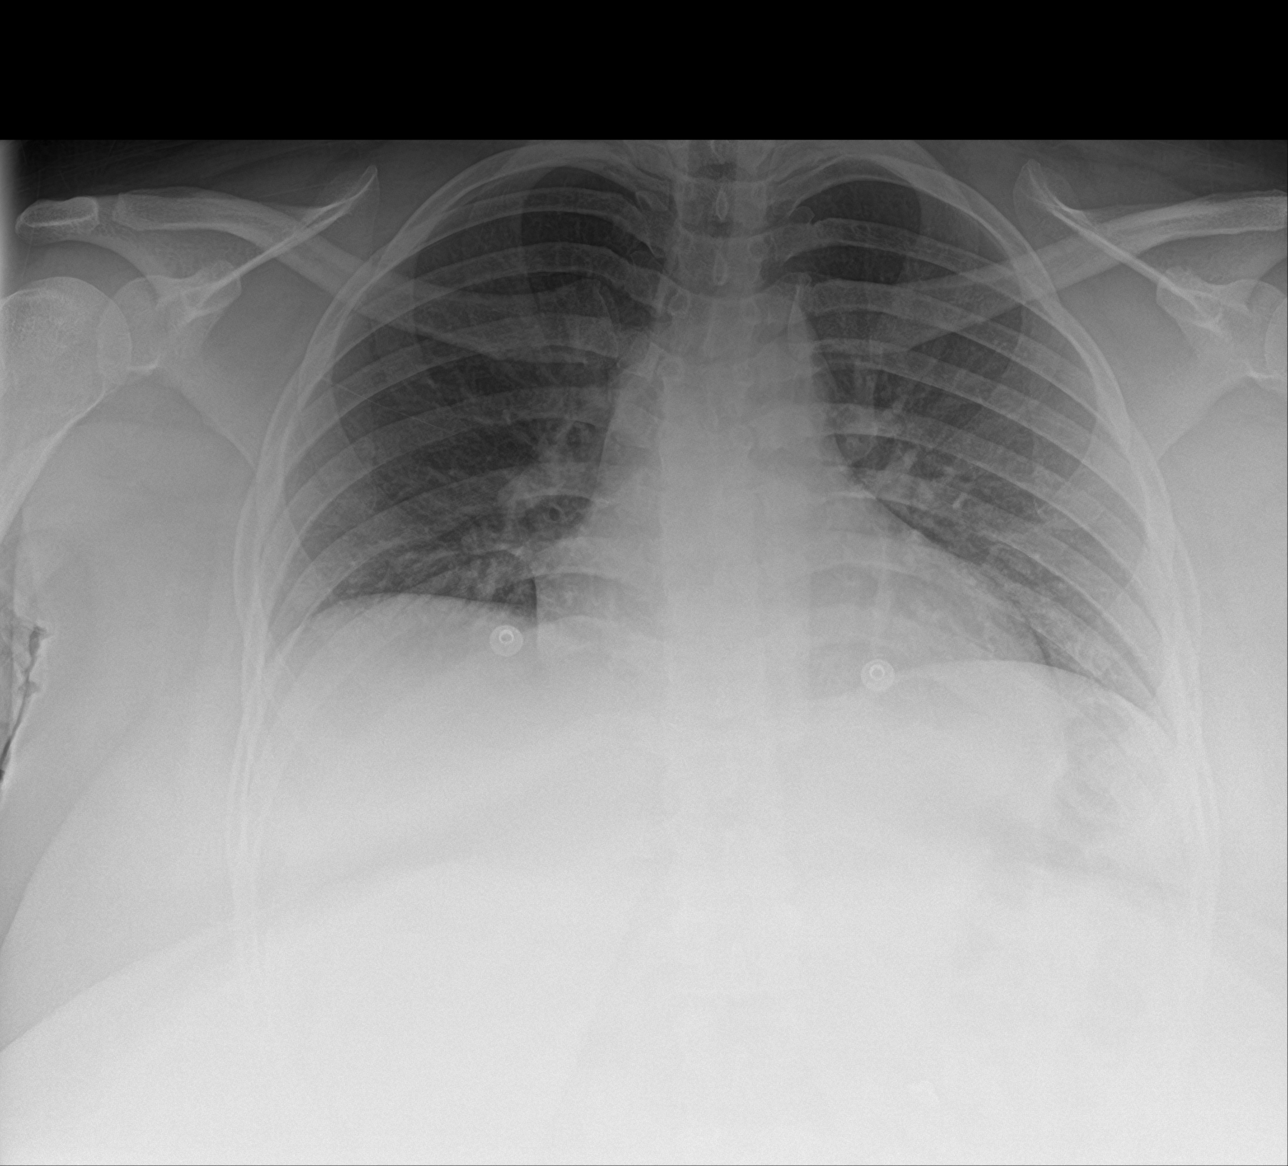

[3 of 3 positions shown; findings below may reference images not displayed]

FINDINGS: The heart size and mediastinal contours are within normal limits.

Lung volumes are low. Lungs are clear. No pleural effusion or
pneumothorax.

The visualized skeletal structures are unremarkable.
IMPRESSION: No active cardiopulmonary disease.

## 2017-09-25 ENCOUNTER — Telehealth: Payer: Self-pay | Admitting: Pulmonary Disease

## 2017-09-25 MED ORDER — BUDESONIDE-FORMOTEROL FUMARATE 160-4.5 MCG/ACT IN AERO
2.0000 | INHALATION_SPRAY | Freq: Two times a day (BID) | RESPIRATORY_TRACT | 3 refills | Status: DC
Start: 2017-09-25 — End: 2018-01-31

## 2017-09-25 MED ORDER — ALBUTEROL SULFATE HFA 108 (90 BASE) MCG/ACT IN AERS
2.0000 | INHALATION_SPRAY | Freq: Four times a day (QID) | RESPIRATORY_TRACT | 2 refills | Status: DC | PRN
Start: 2017-09-25 — End: 2018-01-31

## 2017-09-25 NOTE — Telephone Encounter (Signed)
°*  STAT* If patient is at the pharmacy, call can be transferred to refill team.   1. Which medications need to be refilled? (please list name of each medication and dose if known)  Both inhalers (she was not sure what the names are)  She states there was some pills, she states she takes it at night. States it helps with breathing  2. Which pharmacy/location (including street and city if local pharmacy) is medication to be sent to? [facility] [place]   3. Do they need a 30 day or 90 day supply? 90 day

## 2017-09-25 NOTE — Telephone Encounter (Signed)
Patient advised Singulair has refills she needs to call pharmacy.

## 2017-09-28 ENCOUNTER — Encounter: Payer: Self-pay | Admitting: *Deleted

## 2017-09-28 ENCOUNTER — Ambulatory Visit (INDEPENDENT_AMBULATORY_CARE_PROVIDER_SITE_OTHER): Payer: BLUE CROSS/BLUE SHIELD

## 2017-09-28 ENCOUNTER — Ambulatory Visit
Admission: EM | Admit: 2017-09-28 | Discharge: 2017-09-28 | Disposition: A | Discharge status: Home / Self Care | Payer: BLUE CROSS/BLUE SHIELD | Attending: Family Medicine | Admitting: Family Medicine

## 2017-09-28 DIAGNOSIS — R112 Nausea with vomiting, unspecified: Secondary | ICD-10-CM

## 2017-09-28 DIAGNOSIS — R0602 Shortness of breath: Secondary | ICD-10-CM | POA: Diagnosis not present

## 2017-09-28 DIAGNOSIS — R079 Chest pain, unspecified: Secondary | ICD-10-CM | POA: Diagnosis present

## 2017-09-28 DIAGNOSIS — M940 Chondrocostal junction syndrome [Tietze]: Secondary | ICD-10-CM | POA: Diagnosis not present

## 2017-09-28 MED ORDER — ONDANSETRON 8 MG PO TBDP: 8.0000 mg | ORAL_TABLET | Freq: Three times a day (TID) | ORAL | 0 refills | Status: DC | PRN

## 2017-09-28 NOTE — ED Triage Notes (Signed)
Sharp chest pain and dyspnea x2 days. Seen in the ED in [facility] Sunday for same complaint. Pain worsens with resp. Hx of asthma and tachycadia.

## 2017-09-28 NOTE — ED Provider Notes (Signed)
[facility]    CSN: [id number] Arrival date & time: 09/28/17  1633     History   Chief Complaint Chief Complaint  Patient presents with  . Chest Pain  . Shortness of Breath    HPI Andrea Miller is a 31 y.o. female.   31 yo female with a 3-4 days h/o intermittent sharp chest pains and shortness of breath. Denies any fevers, chills, trauma, falls, wheezing. Also has had upset stomach, nasal congestion, vomiting x 3. Denies any diarrhea, hematemesis, melena.    The history is provided by the patient.    Past Medical History:  Diagnosis Date  . Anxiety   . Asthma   . Bipolar disorder (HCC)   . Chest pain    and angina  . Depression   . Fibromyalgia   . Hypertension   . 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 Obesity   . Seizures (HCC)    secondary to anxiety  . SVT (supraventricular tachycardia) (HCC)    with hx of syncope; managed by [facility]    Patient Active Problem List   Diagnosis Date Noted  . Asthma exacerbation 04/14/2017  . Asthma without status asthmaticus 01/16/2017  . Bipolar affective (HCC) 01/16/2017  . Coronary artery disease 01/16/2017  . Fibromyalgia 04/27/2014  . OSA (obstructive sleep apnea) 04/27/2014  . Seizure-like activity (HCC) 04/27/2014  . Obesity 04/15/2014    Past Surgical History:  Procedure Laterality Date  . NO PAST SURGERIES    . TONSILLECTOMY AND ADENOIDECTOMY N/A 09/05/2016   Procedure: TONSILLECTOMY AND ADENOIDECTOMY;  Surgeon: Linus Salmons, MD;  Location: [facility] ORS;  Service: ENT;  Laterality: N/A;    OB History    Gravida Para Term Preterm AB Living   0 0 0 0 0 0   SAB TAB Ectopic Multiple Live Births   0 0 0 0         Home Medications    Prior to Admission medications   Medication Sig Start Date End Date Taking? Authorizing Provider  albuterol (PROVENTIL HFA;VENTOLIN HFA) 108 (90 Base) MCG/ACT inhaler Inhale 2 puffs into the lungs every 6 (six) hours as  needed for wheezing or shortness of breath. 09/25/17  Yes Merwyn Katos, MD  albuterol (PROVENTIL) (2.5 MG/3ML) 0.083% nebulizer solution Take 2.5 mg by nebulization every 4 (four) hours as needed for wheezing or shortness of breath.   Yes [provider]  amphetamine-dextroamphetamine (ADDERALL XR) 15 MG 24 hr capsule Take 15 mg by mouth every morning.   Yes [provider]  BELSOMRA 20 MG TABS Take 20 mg by mouth at bedtime. 02/07/17  Yes [provider]  budesonide-formoterol (SYMBICORT) 160-4.5 MCG/ACT inhaler Inhale 2 puffs into the lungs 2 (two) times daily. 09/25/17  Yes Merwyn Katos, MD  carbamazepine (TEGRETOL) 200 MG tablet Take 200 mg by mouth daily. 03/26/17  Yes [provider]  Cariprazine HCl (VRAYLAR) 3 MG CAPS Take 1 capsule by mouth at bedtime.    Yes [provider]  clonazePAM (KLONOPIN) 2 MG tablet Take 2 mg by mouth at bedtime as needed for anxiety.   Yes [provider]  diltiazem (CARDIZEM CD) 360 MG 24 hr capsule Take 360 mg by mouth daily.    Yes [provider]  furosemide (LASIX) 20 MG tablet Take 20 mg by mouth every morning.   Yes [provider]  gabapentin (NEURONTIN) 300 MG capsule Take 300 mg by mouth 3 (  three) times daily.   Yes [provider]  hydrochlorothiazide (HYDRODIURIL) 25 MG tablet Take 25 mg by mouth daily. 03/03/17  Yes [provider]  ibuprofen (ADVIL,MOTRIN) 600 MG tablet Take 1 tablet (600 mg total) by mouth every 6 (six) hours as needed. 12/22/16  Yes Joni ReiningSmith, Ronald K, PA-C  metoprolol succinate (TOPROL-XL) 100 MG 24 hr tablet Take 100 mg by mouth 2 (two) times daily. 03/26/17  Yes [provider]  montelukast (SINGULAIR) 10 MG tablet Take 1 tablet (10 mg total) by mouth at bedtime. 09/03/17 09/03/18 Yes Merwyn KatosSimonds, David B, MD  potassium chloride (K-DUR) 10 MEQ tablet Take 10 mEq by mouth every morning.   Yes [provider]  tiZANidine (ZANAFLEX)  4 MG tablet Take 4 mg by mouth 3 (three) times daily as needed. 03/26/17  Yes [provider]  fluticasone (FLONASE) 50 MCG/ACT nasal spray Place 2 sprays into both nostrils daily.    [provider]  folic acid (FOLVITE) 1 MG tablet Take 1 mg by mouth daily.  12/15/16   [provider]  ondansetron (ZOFRAN ODT) 8 MG disintegrating tablet Take 1 tablet (8 mg total) by mouth every 8 (eight) hours as needed. 09/28/17   [name], [name], MD    Family History Family History  Problem Relation Age of Onset  . Diabetes Mother   . Hypertension Mother   . Asthma Mother   . Diabetes Father   . Hypertension Father     Social History Social History  Substance Use Topics  . Smoking status: Never Smoker  . Smokeless tobacco: Never Used  . Alcohol use No     Allergies   Cortizone-10 [hydrocortisone]; Garlic; Penicillins; and Corticosteroids   Review of Systems Review of Systems   Physical Exam Triage Vital Signs ED Triage Vitals  Enc Vitals Group     BP 09/28/17 1646 (!) 156/99     Pulse Rate 09/28/17 1646 (!) 101     Resp 09/28/17 1646 20     Temp 09/28/17 1646 98 F (36.7 C)     Temp Source 09/28/17 1646 Oral     SpO2 09/28/17 1646 100 %     Weight 09/28/17 1647 (!) 360 lb (163.3 kg)     Height 09/28/17 1647 5\' 10"  (1.778 m)     Head Circumference --      Peak Flow --      Pain Score 09/28/17 1647 9     Pain Loc --      Pain Edu? --      Excl. in GC? --    No data found.   Updated Vital Signs BP (!) 156/99 (BP Location: Left Arm)   Pulse (!) 101   Temp 98 F (36.7 C) (Oral)   Resp 20   Ht 5\' 10"  (1.778 m)   Wt (!) 360 lb (163.3 kg)   LMP 09/18/2017 Comment: denies preg  SpO2 100%   BMI 51.65 kg/m   Visual Acuity Right Eye Distance:   Left Eye Distance:   Bilateral Distance:    Right Eye Near:   Left Eye Near:    Bilateral Near:     Physical Exam  Constitutional: She appears well-developed and well-nourished. No distress.  HENT:    Head: Normocephalic and atraumatic.  Right Ear: Tympanic membrane, external ear and ear canal normal.  Left Ear: Tympanic membrane, external ear and ear canal normal.  Nose: No mucosal edema, rhinorrhea, nose lacerations, sinus tenderness, nasal deformity, septal deviation or  nasal septal hematoma. No epistaxis.  No foreign bodies. Right sinus exhibits no maxillary sinus tenderness and no frontal sinus tenderness. Left sinus exhibits no maxillary sinus tenderness and no frontal sinus tenderness.  Mouth/Throat: Uvula is midline, oropharynx is clear and moist and mucous membranes are normal. No oropharyngeal exudate.  Eyes: Pupils are equal, round, and reactive to light. Conjunctivae and EOM are normal. Right eye exhibits no discharge. Left eye exhibits no discharge. No scleral icterus.  Neck: Normal range of motion. Neck supple. No thyromegaly present.  Cardiovascular: Normal rate, regular rhythm and normal heart sounds.   Pulmonary/Chest: Effort normal and breath sounds normal. No respiratory distress. She has no wheezes. She has no rales. She exhibits tenderness (reproducible; mid sternum and left chest wall).  Lymphadenopathy:    She has no cervical adenopathy.  Skin: She is not diaphoretic.  Nursing note and vitals reviewed.    UC Treatments / Results  Labs (all labs ordered are listed, but only abnormal results are displayed) Labs Reviewed - No data to display  EKG  EKG Interpretation None       Radiology Dg Chest 2 View  Result Date: 09/28/2017 CLINICAL DATA:  Chest pain and shortness of breath.  Chills. EXAM: CHEST  2 VIEW COMPARISON:  June 26, 2017 FINDINGS: Degree of inspiration is somewhat shallow. There is no edema or consolidation. Heart size and pulmonary vascularity are normal. No adenopathy. No bone lesions. IMPRESSION: Shallow degree of inspiration. No edema or consolidation. Stable cardiac silhouette. Electronically Signed   By: Bretta Bang III M.D.   On:  09/28/2017 17:17    Procedures ED EKG Date/Time: 09/28/2017 9:02 PM Performed by: Payton Mccallum Authorized by: Payton Mccallum   ECG reviewed by ED Physician in the absence of a cardiologist: yes   Interpretation:    Interpretation: abnormal   Rate:    ECG rate:  104   ECG rate assessment: tachycardic   Rhythm:    Rhythm: sinus rhythm   Ectopy:    Ectopy: none   QRS:    QRS axis:  Normal Conduction:    Conduction: normal   ST segments:    ST segments:  Normal T waves:    T waves: normal     (including critical care time)  Medications Ordered in UC Medications - No data to display   Initial Impression / Assessment and Plan / UC Course  I have reviewed the triage vital signs and the nursing notes.  Pertinent labs & imaging results that were available during my care of the patient were reviewed by me and considered in my medical decision making (see chart for details).       Final Clinical Impressions(s) / UC Diagnoses   Final diagnoses:  Costochondritis  Non-intractable vomiting with nausea, unspecified vomiting type    New Prescriptions Discharge Medication List as of 09/28/2017  5:37 PM    START taking these medications   Details  ondansetron (ZOFRAN ODT) 8 MG disintegrating tablet Take 1 tablet (8 mg total) by mouth every 8 (eight) hours as needed., Starting Fri 09/28/2017, Normal       1. ekg/x-ray results and diagnosis reviewed with patient 2. rx as per orders above; reviewed possible side effects, interactions, risks and benefits  3. Recommend supportive treatment with rest, otc analgesics prn, ice 4. Follow-up prn if symptoms worsen or don't improve Controlled Substance Prescriptions [place] Controlled Substance Registry consulted? Not Applicable   Payton Mccallum, MD 09/28/17 2105

## 2017-10-31 ENCOUNTER — Other Ambulatory Visit: Payer: Self-pay

## 2017-10-31 ENCOUNTER — Emergency Department: Payer: BLUE CROSS/BLUE SHIELD

## 2017-10-31 ENCOUNTER — Emergency Department
Admission: EM | Admit: 2017-10-31 | Discharge: 2017-10-31 | Disposition: A | Discharge status: Home / Self Care | Payer: BLUE CROSS/BLUE SHIELD | Attending: Emergency Medicine | Admitting: Emergency Medicine

## 2017-10-31 DIAGNOSIS — E876 Hypokalemia: Secondary | ICD-10-CM | POA: Diagnosis not present

## 2017-10-31 DIAGNOSIS — Z79899 Other long term (current) drug therapy: Secondary | ICD-10-CM | POA: Insufficient documentation

## 2017-10-31 DIAGNOSIS — I2510 Atherosclerotic heart disease of native coronary artery without angina pectoris: Secondary | ICD-10-CM | POA: Diagnosis not present

## 2017-10-31 DIAGNOSIS — R112 Nausea with vomiting, unspecified: Secondary | ICD-10-CM | POA: Insufficient documentation

## 2017-10-31 DIAGNOSIS — R002 Palpitations: Secondary | ICD-10-CM

## 2017-10-31 DIAGNOSIS — I10 Essential (primary) hypertension: Secondary | ICD-10-CM | POA: Insufficient documentation

## 2017-10-31 DIAGNOSIS — E860 Dehydration: Secondary | ICD-10-CM | POA: Diagnosis not present

## 2017-10-31 DIAGNOSIS — R42 Dizziness and giddiness: Secondary | ICD-10-CM | POA: Diagnosis present

## 2017-10-31 DIAGNOSIS — J45909 Unspecified asthma, uncomplicated: Secondary | ICD-10-CM | POA: Insufficient documentation

## 2017-10-31 LAB — CBC
HCT: 43.1 % (ref 35.0–47.0)
Hemoglobin: 14.3 g/dL (ref 12.0–16.0)
MCH: 27.1 pg (ref 26.0–34.0)
MCHC: 33.1 g/dL (ref 32.0–36.0)
MCV: 82 fL (ref 80.0–100.0)
Platelets: 233 10*3/uL (ref 150–440)
RBC: 5.26 MIL/uL — ABNORMAL HIGH (ref 3.80–5.20)
RDW: 13.8 % (ref 11.5–14.5)
WBC: 5.8 10*3/uL (ref 3.6–11.0)

## 2017-10-31 LAB — MAGNESIUM: Magnesium: 1.9 mg/dL (ref 1.7–2.4)

## 2017-10-31 LAB — BASIC METABOLIC PANEL
Anion gap: 16 — ABNORMAL HIGH (ref 5–15)
BUN: 9 mg/dL (ref 6–20)
CO2: 27 mmol/L (ref 22–32)
Calcium: 10.4 mg/dL — ABNORMAL HIGH (ref 8.9–10.3)
Chloride: 93 mmol/L — ABNORMAL LOW (ref 101–111)
Creatinine, Ser: 0.86 mg/dL (ref 0.44–1.00)
GFR calc Af Amer: >60 mL/min (ref >60)
GFR calc non Af Amer: >60 mL/min (ref >60)
Glucose, Bld: 115 mg/dL — ABNORMAL HIGH (ref 65–99)
Potassium: 2.9 mmol/L — ABNORMAL LOW (ref 3.5–5.1)
Sodium: 136 mmol/L (ref 135–145)

## 2017-10-31 LAB — TSH: TSH: 0.88 u[IU]/mL (ref 0.350–4.500)

## 2017-10-31 LAB — POCT PREGNANCY, URINE: Preg Test, Ur: NEGATIVE

## 2017-10-31 LAB — TROPONIN I
Troponin I: <0.03 ng/mL (ref <0.03)
Troponin I: <0.03 ng/mL (ref <0.03)

## 2017-10-31 MED ORDER — SODIUM CHLORIDE 0.9 % IV BOLUS (SEPSIS)
1000.0000 mL | Freq: Once | INTRAVENOUS | Status: AC
  Administered 2017-10-31: 1000 mL via INTRAVENOUS

## 2017-10-31 MED ORDER — ONDANSETRON HCL 4 MG/2ML IJ SOLN
4.0000 mg | Freq: Once | INTRAMUSCULAR | Status: AC
  Administered 2017-10-31: 4 mg via INTRAVENOUS
  Filled 2017-10-31: qty 2

## 2017-10-31 MED ORDER — ACETAMINOPHEN 500 MG PO TABS
1000.0000 mg | ORAL_TABLET | Freq: Once | ORAL | Status: AC
  Administered 2017-10-31: 1000 mg via ORAL
  Filled 2017-10-31: qty 2

## 2017-10-31 MED ORDER — POTASSIUM CHLORIDE CRYS ER 20 MEQ PO TBCR
40.0000 meq | EXTENDED_RELEASE_TABLET | Freq: Once | ORAL | Status: AC
  Administered 2017-10-31: 40 meq via ORAL
  Filled 2017-10-31: qty 2

## 2017-10-31 MED ORDER — IOPAMIDOL (ISOVUE-370) INJECTION 76%
100.0000 mL | Freq: Once | INTRAVENOUS | Status: AC | PRN
  Administered 2017-10-31: 100 mL via INTRAVENOUS

## 2017-10-31 MED ORDER — ONDANSETRON 4 MG PO TBDP: 4.0000 mg | ORAL_TABLET | Freq: Three times a day (TID) | ORAL | 0 refills | Status: DC | PRN

## 2017-10-31 NOTE — ED Notes (Signed)
UA PREG results = NEG 

## 2017-10-31 NOTE — ED Notes (Signed)
Patient taken to CT scan.

## 2017-10-31 NOTE — ED Notes (Signed)
Two unsuccessful attempts at Venous access for blood sample. Lab notified and requested.

## 2017-10-31 NOTE — ED Provider Notes (Signed)
[facility] Emergency Department Provider Note  ____________________________________________  Time seen: Approximately 5:04 PM  I have reviewed the triage vital signs and the nursing notes.   HISTORY  Chief Complaint Chest Pain; Dizziness; and Shortness of Breath   HPI Andrea Miller is a 31 y.o. female with h/o tachycardia on metoprolol and cardizem, HTN, Type II DM, anxiety, chronic pain, migraines, pseudoseizures, fibromyalgia and sleep apnea who presents for evaluation of chest pain, dizziness, vomiting. Patient reports that her symptoms started yesterday. She is complaining of constant moderate  left-sided chest pain that is dull and sharp, intermittent, better when patient is not moving and worse with movement, non pleuritic, currently mild, non radiating and associated with the palpitations, SOB, dizziness, near syncopal episodes, and several episodes of nonbloody nonbilious emesis. No diarrhea, no cough, no fever. No personal or family history blood clots, no recent travel immobilization, no leg pain or swelling, no hemoptysis, no exogenous hormones. Patient endorses compliance with all her medications. She is not a smoker. She has a history of asthma but denies wheezing.      Past Medical History:  Diagnosis Date  . Anxiety   . Asthma   . Bipolar disorder (HCC)   . Chest pain    and angina  . Depression   . Fibromyalgia   . Hypertension   . 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 Obesity   . Seizures (HCC)    secondary to anxiety  . SVT (supraventricular tachycardia) (HCC)    with hx of syncope; managed by [facility]    Patient Active Problem List   Diagnosis Date Noted  . Asthma exacerbation 04/14/2017  . Asthma without status asthmaticus 01/16/2017  . Bipolar affective (HCC) 01/16/2017  . Coronary artery disease 01/16/2017  . Fibromyalgia 04/27/2014  . OSA (obstructive sleep apnea) 04/27/2014  .  Seizure-like activity (HCC) 04/27/2014  . Obesity 04/15/2014    Past Surgical History:  Procedure Laterality Date  . NO PAST SURGERIES    . TONSILLECTOMY AND ADENOIDECTOMY N/A 09/05/2016   Procedure: TONSILLECTOMY AND ADENOIDECTOMY;  Surgeon: Linus Salmons, MD;  Location: [facility] ORS;  Service: ENT;  Laterality: N/A;    Prior to Admission medications   Medication Sig Start Date End Date Taking? Authorizing Provider  albuterol (PROVENTIL HFA;VENTOLIN HFA) 108 (90 Base) MCG/ACT inhaler Inhale 2 puffs into the lungs every 6 (six) hours as needed for wheezing or shortness of breath. 09/25/17   Merwyn Katos, MD  albuterol (PROVENTIL) (2.5 MG/3ML) 0.083% nebulizer solution Take 2.5 mg by nebulization every 4 (four) hours as needed for wheezing or shortness of breath.    [provider]  amphetamine-dextroamphetamine (ADDERALL XR) 15 MG 24 hr capsule Take 15 mg by mouth every morning.    [provider]  BELSOMRA 20 MG TABS Take 20 mg by mouth at bedtime. 02/07/17   [provider]  budesonide-formoterol (SYMBICORT) 160-4.5 MCG/ACT inhaler Inhale 2 puffs into the lungs 2 (two) times daily. 09/25/17   Merwyn Katos, MD  carbamazepine (TEGRETOL) 200 MG tablet Take 200 mg by mouth daily. 03/26/17   [provider]  Cariprazine HCl (VRAYLAR) 3 MG CAPS Take 1 capsule by mouth at bedtime.     [provider]  clonazePAM (KLONOPIN) 2 MG tablet Take 2 mg by mouth at bedtime as needed for anxiety.    [provider]  diltiazem (CARDIZEM CD) 360 MG 24 hr capsule Take 360 mg  by mouth daily.     [provider]  fluticasone (FLONASE) 50 MCG/ACT nasal spray Place 2 sprays into both nostrils daily.    [provider]  folic acid (FOLVITE) 1 MG tablet Take 1 mg by mouth daily.  12/15/16   [provider]  furosemide (LASIX) 20 MG tablet Take 20 mg by mouth every morning.    [provider]  gabapentin (NEURONTIN) 300 MG  capsule Take 300 mg by mouth 3 (three) times daily.    [provider]  hydrochlorothiazide (HYDRODIURIL) 25 MG tablet Take 25 mg by mouth daily. 03/03/17   [provider]  ibuprofen (ADVIL,MOTRIN) 600 MG tablet Take 1 tablet (600 mg total) by mouth every 6 (six) hours as needed. 12/22/16   Joni Reining, PA-C  metoprolol succinate (TOPROL-XL) 100 MG 24 hr tablet Take 100 mg by mouth 2 (two) times daily. 03/26/17   [provider]  montelukast (SINGULAIR) 10 MG tablet Take 1 tablet (10 mg total) by mouth at bedtime. 09/03/17 09/03/18  Merwyn Katos, MD  ondansetron (ZOFRAN ODT) 4 MG disintegrating tablet Take 1 tablet (4 mg total) by mouth every 8 (eight) hours as needed for nausea or vomiting. 10/31/17   Nita Sickle, MD  potassium chloride (K-DUR) 10 MEQ tablet Take 10 mEq by mouth every morning.    [provider]  tiZANidine (ZANAFLEX) 4 MG tablet Take 4 mg by mouth 3 (three) times daily as needed. 03/26/17   [provider]    Allergies Cortizone-10 [hydrocortisone]; Garlic; Penicillins; and Corticosteroids  Family History  Problem Relation Age of Onset  . Diabetes Mother   . Hypertension Mother   . Asthma Mother   . Diabetes Father   . Hypertension Father     Social History Social History   Tobacco Use  . Smoking status: Never Smoker  . Smokeless tobacco: Never Used  Substance Use Topics  . Alcohol use: No  . Drug use: No    Review of Systems  Constitutional: Negative for fever. + dizziness Eyes: Negative for visual changes. ENT: Negative for sore throat. Neck: No neck pain  Cardiovascular: + chest pain and palpitations Respiratory: Negative for shortness of breath. Gastrointestinal: Negative for abdominal pain, diarrhea. + N/V Genitourinary: Negative for dysuria. Musculoskeletal: Negative for back pain. Skin: Negative for rash. Neurological: Negative for headaches, weakness or numbness. Psych: No SI or  HI  ____________________________________________   PHYSICAL EXAM:  VITAL SIGNS: ED Triage Vitals  Enc Vitals Group     BP 10/31/17 1523 (!) 134/96     Pulse Rate 10/31/17 1523 (!) 121     Resp 10/31/17 1523 18     Temp 10/31/17 1523 99.3 F (37.4 C)     Temp Source 10/31/17 1523 Oral     SpO2 10/31/17 1523 100 %     Weight 10/31/17 1515 (!) 350 lb (158.8 kg)     Height 10/31/17 1515 5\' 10"  (1.778 m)     Head Circumference --      Peak Flow --      Pain Score 10/31/17 1515 10     Pain Loc --      Pain Edu? --      Excl. in GC? --     Constitutional: Alert and oriented. Well appearing and in no apparent distress. HEENT:      Head: Normocephalic and atraumatic.         Eyes: Conjunctivae are normal. Sclera is non-icteric.  Mouth/Throat: Mucous membranes are moist.       Neck: Supple with no signs of meningismus. Cardiovascular: Tachycardic with regular rhythm. No murmurs, gallops, or rubs. 2+ symmetrical distal pulses are present in all extremities. No JVD. Respiratory: Normal respiratory effort. Lungs are clear to auscultation bilaterally. No wheezes, crackles, or rhonchi.  Gastrointestinal: Obese, non tender, and non distended with positive bowel sounds. No rebound or guarding. Genitourinary: No CVA tenderness. Musculoskeletal: Nontender with normal range of motion in all extremities. No edema, cyanosis, or erythema of extremities. Neurologic: Normal speech and language. Face is symmetric. Moving all extremities. No gross focal neurologic deficits are appreciated. Skin: Skin is warm, dry and intact. No rash noted. Psychiatric: Mood and affect are normal. Speech and behavior are normal.  ____________________________________________   LABS (all labs ordered are listed, but only abnormal results are displayed)  Labs Reviewed  BASIC METABOLIC PANEL - Abnormal; Notable for the following components:      Result Value   Potassium 2.9 (*)    Chloride 93 (*)    Glucose,  Bld 115 (*)    Calcium 10.4 (*)    Anion gap 16 (*)    All other components within normal limits  CBC - Abnormal; Notable for the following components:   RBC 5.26 (*)    All other components within normal limits  TROPONIN I  TROPONIN I  MAGNESIUM  TSH  POC URINE PREG, ED  POCT PREGNANCY, URINE   ____________________________________________  EKG  ED ECG REPORT I, Nita Sickle, the attending physician, personally viewed and interpreted this ECG.  Sinus tachycardia, rate of 122, prolonged QTC, normal axis, no ST elevations or depressions. unchanged from prior from November 2018 ____________________________________________  RADIOLOGY  CXR:  1. Low lung volumes. 2. No acute cardiopulmonary disease.  CTA chest: 1. No pulmonary embolus. 2. No significant pulmonary disease to explain chest pain or shortness of breath.  ____________________________________________   PROCEDURES  Procedure(s) performed: None Procedures Critical Care performed:  None ____________________________________________   INITIAL IMPRESSION / ASSESSMENT AND PLAN / ED COURSE   31 y.o. female with h/o tachycardia on metoprolol and cardizem, HTN, Type II DM, anxiety, chronic pain, migraines, pseudoseizures, fibromyalgia and sleep apnea who presents for evaluation of left sided intermittent chest pain, dizziness, vomiting, and SOB since yesterday. patient is well-appearing, in no distress, vital show tachycardia with a low-grade temp of 99.3, normal work of breathing, lungs are clear to auscultation. EKG shows sinus tachycardia which is unchanged from prior although the rate slightly elevated. Labs concerned for dehydration with anion gap of 16. also concerning for hypokalemia. Patient is on potassium supplementation and is on hydrochlorothiazide. We'll replete her potassium here. We'll check magnesium levels. Troponin 1 is negative. Chest x-ray with no acute findings. We'll send patient for CTA to rule  out PE (wells criteria - moderate risk).    _________________________ 8:17 PM on 10/31/2017 -----------------------------------------  patient feels markedly improved after IV fluids. Her heart rate is now 98 which is patient's baseline (90s-110s). She is tolerating PO with no nausea or vomiting. CTA negative for PE or PNA. K was supplemented. Patient will be dc home with close f/u with PCP. Discussed return precautions with patient.   As part of my medical decision making, I reviewed the following data within the electronic MEDICAL RECORD NUMBER Nursing notes reviewed and incorporated, Labs reviewed , EKG interpreted , Old EKG reviewed, Old chart reviewed, Radiograph reviewed , Notes from prior ED visits and [place] Controlled Substance Database  Pertinent labs & imaging results that were available during my care of the patient were reviewed by me and considered in my medical decision making (see chart for details).    ____________________________________________   FINAL CLINICAL IMPRESSION(S) / ED DIAGNOSES  Final diagnoses:  Dehydration  Non-intractable vomiting with nausea, unspecified vomiting type  Palpitations  Hypokalemia      NEW MEDICATIONS STARTED DURING THIS VISIT:  ED Discharge Orders        Ordered    ondansetron (ZOFRAN ODT) 4 MG disintegrating tablet  Every 8 hours PRN     10/31/17 2007       Note:  This document was prepared using Dragon voice recognition software and may include unintentional dictation errors.    Nita SickleVeronese, [place], MD 10/31/17 2018

## 2017-10-31 NOTE — ED Triage Notes (Signed)
Pt to ER via POV c/o chest pain to left side, dizziness and syncopal episodes yesterday, SOB. Pt reports nausea vomiting. Pt alert and oriented X4, active, cooperative, pt in NAD. RR even and unlabored, color WNL.

## 2017-10-31 NOTE — ED Notes (Signed)
Patient ambulated to and from room commode. Patient states she had a small emesis, but did not throw up the potassium pills.

## 2017-11-23 DIAGNOSIS — I10 Essential (primary) hypertension: Secondary | ICD-10-CM | POA: Insufficient documentation

## 2017-12-28 ENCOUNTER — Other Ambulatory Visit: Payer: Self-pay | Admitting: Internal Medicine

## 2017-12-28 DIAGNOSIS — R110 Nausea: Secondary | ICD-10-CM

## 2017-12-31 ENCOUNTER — Ambulatory Visit
Admission: RE | Admit: 2017-12-31 | Discharge: 2017-12-31 | Disposition: A | Discharge status: Home / Self Care | Payer: BLUE CROSS/BLUE SHIELD | Source: Ambulatory Visit | Attending: Internal Medicine | Admitting: Internal Medicine

## 2017-12-31 DIAGNOSIS — R112 Nausea with vomiting, unspecified: Secondary | ICD-10-CM | POA: Insufficient documentation

## 2017-12-31 DIAGNOSIS — R935 Abnormal findings on diagnostic imaging of other abdominal regions, including retroperitoneum: Secondary | ICD-10-CM | POA: Diagnosis not present

## 2017-12-31 DIAGNOSIS — R110 Nausea: Secondary | ICD-10-CM

## 2018-01-24 ENCOUNTER — Ambulatory Visit: Payer: Self-pay | Admitting: General Surgery

## 2018-01-31 ENCOUNTER — Ambulatory Visit (INDEPENDENT_AMBULATORY_CARE_PROVIDER_SITE_OTHER): Payer: BLUE CROSS/BLUE SHIELD | Admitting: Pulmonary Disease

## 2018-01-31 ENCOUNTER — Encounter: Payer: Self-pay | Admitting: Pulmonary Disease

## 2018-01-31 VITALS — BP 138/88 | HR 79 | Ht 70.0 in | Wt 368.0 lb

## 2018-01-31 DIAGNOSIS — J4530 Mild persistent asthma, uncomplicated: Secondary | ICD-10-CM

## 2018-01-31 DIAGNOSIS — Z01818 Encounter for other preprocedural examination: Secondary | ICD-10-CM | POA: Diagnosis not present

## 2018-01-31 DIAGNOSIS — K811 Chronic cholecystitis: Secondary | ICD-10-CM

## 2018-01-31 MED ORDER — MONTELUKAST SODIUM 10 MG PO TABS: 10.0000 mg | ORAL_TABLET | Freq: Every day | ORAL | 10 refills | Status: DC

## 2018-01-31 MED ORDER — BUDESONIDE-FORMOTEROL FUMARATE 160-4.5 MCG/ACT IN AERO: 2.0000 | INHALATION_SPRAY | Freq: Two times a day (BID) | RESPIRATORY_TRACT | 10 refills | Status: DC

## 2018-01-31 MED ORDER — ALBUTEROL SULFATE HFA 108 (90 BASE) MCG/ACT IN AERS: 2.0000 | INHALATION_SPRAY | Freq: Four times a day (QID) | RESPIRATORY_TRACT | 10 refills | Status: DC | PRN

## 2018-01-31 NOTE — Patient Instructions (Signed)
Continue Symbicort inhaler Continue Singulair (montelukast) Continue albuterol inhaler as needed We discussed in detail the importance of weight loss and strategies to achieve this In the standpoint of your asthma, you may undergo bladder surgery next month.  The critical care medicine service will be available to participate in your care after surgery  Follow-up in 6 months or sooner as needed

## 2018-02-01 NOTE — Progress Notes (Signed)
PULMONARY OFFICE FOLLOW UP NOTE  Requesting MD/Service: self Date of initial consultation: 07/23/17 Reason for consultation: asthma  PT PROFILE: 32 y.o. female never smoker with multiple medical problems including asthma, OSA and morbid obesity referred after hospitalization for asthma exacerbation   DATA: 03/20/17 CT chest: low volumes, mild atelectasis, no infiltrates or edema 04/15/17 Echocardiogram: normal LVEF, no RV or RA dilation. RVSP not estimated 08/30/17 PFTs: Poor quality test (poor reproducibility) with no definite obstruction on spirometry. However, there was significant improvement in both FEV1 and FVC after bronchodilator therapy. Lung volumes consistent with moderate to severe restriction. She could not perform the DLCO maneuver  INTERVAL: No major event  SUBJ: ROutine re-eval. She has no new complaints. Compliant with meds as prescribed. Believes that addition of montelukast has been beneficial. Using albuterol rescue MDI 0-3 times per day. Has not made any concerted effort @ weight loss. Denies CP, fever, purulent sputum, hemoptysis, LE edema and calf tenderness. She is scheduled to undergo relatively minor bladder surgery next month   Vitals:   01/31/18 0901 01/31/18 0907  BP:  138/88  Pulse:  79  SpO2:  97%  Weight: (!) 166.9 kg (368 lb)   Height: 5\' 10"  (1.778 m)   RA   EXAM:  Gen: Very obese, NAD HEENT: NCAT, sclerae white Lungs: Full BS, no wheezes or other adventitious sounds Cardiovascular: RRR, no murmurs noted Abdomen: Soft, nontender, normal BS Ext: No edema, clubbing, cyanosis Neuro: No focal deficits  DATA:   BMP Latest Ref Rng & Units 10/31/2017 08/30/2017 06/26/2017  Glucose 65 - 99 mg/dL 696(V) 97 89  BUN 6 - 20 mg/dL 9 7 10   Creatinine 0.44 - 1.00 mg/dL 8.93 8.10 1.75  Sodium 135 - 145 mmol/L 136 141 140  Potassium 3.5 - 5.1 mmol/L 2.9(L) 3.1(L) 3.9  Chloride 101 - 111 mmol/L 93(L) 103 106  CO2 22 - 32 mmol/L 27 26 24   Calcium 8.9 -  10.3 mg/dL 10.4(H) 9.3 9.5    CBC Latest Ref Rng & Units 10/31/2017 08/30/2017 06/26/2017  WBC 3.6 - 11.0 K/uL 5.8 3.8 11.8(H)  Hemoglobin 12.0 - 16.0 g/dL 10.2 58.5 27.7  Hematocrit 35.0 - 47.0 % 43.1 38.5 38.7  Platelets 150 - 440 K/uL 233 260 270    CXR 10/31/17:  No acute findings    IMPRESSION:     ICD-10-CM   1. Mild persistent asthma without complication J45.30   2. Severe obesity (BMI >= 40) (HCC) E66.01   3. Chronic cholecystitis K81.1   4. Preoperative clearance Z01.818    The majority of this encounter involved discussion of obesity and its impact on her respiratory symptoms. She is seemingly poorly informed re: causes of obesity and strategies for weight loss  She is scheduled for "bladder surgery" next months. She has difficulty explaining what procedure is planned but it sounds like simple cystoscopy. From the standpoint of her asthma, she is cleared for this procedure.  PLAN:  Continue Symbicort inhaler Cont montelukast 10 mg by mouth qhs Continue albuterol (Ventolin) as needed  She may undergo planned procedure next month. Her asthma probably presents little risk of peri-op pulmonary complications. Very severe obesity, on the other hand, is her greatest risk factor for peri-op complications. If she requires hospitalization post operatively, PCCM service will be available to participate in her care  Follow-up in 6 months with target weight of 350 #   Billy Fischer, MD PCCM service Mobile [phone] Pager [phone] 02/01/2018 8:08 PM

## 2018-02-11 ENCOUNTER — Emergency Department: Payer: BLUE CROSS/BLUE SHIELD

## 2018-02-11 ENCOUNTER — Emergency Department
Admission: EM | Admit: 2018-02-11 | Discharge: 2018-02-11 | Disposition: A | Discharge status: Home / Self Care | Payer: BLUE CROSS/BLUE SHIELD | Attending: Emergency Medicine | Admitting: Emergency Medicine

## 2018-02-11 ENCOUNTER — Other Ambulatory Visit: Payer: Self-pay

## 2018-02-11 DIAGNOSIS — K8050 Calculus of bile duct without cholangitis or cholecystitis without obstruction: Secondary | ICD-10-CM | POA: Diagnosis not present

## 2018-02-11 DIAGNOSIS — R1011 Right upper quadrant pain: Secondary | ICD-10-CM | POA: Diagnosis present

## 2018-02-11 DIAGNOSIS — J45909 Unspecified asthma, uncomplicated: Secondary | ICD-10-CM | POA: Insufficient documentation

## 2018-02-11 DIAGNOSIS — K8020 Calculus of gallbladder without cholecystitis without obstruction: Secondary | ICD-10-CM

## 2018-02-11 DIAGNOSIS — I10 Essential (primary) hypertension: Secondary | ICD-10-CM | POA: Insufficient documentation

## 2018-02-11 DIAGNOSIS — Z79899 Other long term (current) drug therapy: Secondary | ICD-10-CM | POA: Insufficient documentation

## 2018-02-11 DIAGNOSIS — I2510 Atherosclerotic heart disease of native coronary artery without angina pectoris: Secondary | ICD-10-CM | POA: Insufficient documentation

## 2018-02-11 LAB — CBC
HCT: 40 % (ref 35.0–47.0)
Hemoglobin: 12.8 g/dL (ref 12.0–16.0)
MCH: 26.5 pg (ref 26.0–34.0)
MCHC: 31.9 g/dL — ABNORMAL LOW (ref 32.0–36.0)
MCV: 82.8 fL (ref 80.0–100.0)
Platelets: 255 10*3/uL (ref 150–440)
RBC: 4.83 MIL/uL (ref 3.80–5.20)
RDW: 14.8 % — ABNORMAL HIGH (ref 11.5–14.5)
WBC: 10.4 10*3/uL (ref 3.6–11.0)

## 2018-02-11 LAB — COMPREHENSIVE METABOLIC PANEL
ALT: 10 U/L — ABNORMAL LOW (ref 14–54)
AST: 20 U/L (ref 15–41)
Albumin: 3.9 g/dL (ref 3.5–5.0)
Alkaline Phosphatase: 73 U/L (ref 38–126)
Anion gap: 8 (ref 5–15)
BUN: 9 mg/dL (ref 6–20)
CO2: 23 mmol/L (ref 22–32)
Calcium: 8.7 mg/dL — ABNORMAL LOW (ref 8.9–10.3)
Chloride: 104 mmol/L (ref 101–111)
Creatinine, Ser: 0.73 mg/dL (ref 0.44–1.00)
GFR calc Af Amer: >60 mL/min (ref >60)
GFR calc non Af Amer: >60 mL/min (ref >60)
Glucose, Bld: 98 mg/dL (ref 65–99)
Potassium: 3.5 mmol/L (ref 3.5–5.1)
Sodium: 135 mmol/L (ref 135–145)
Total Bilirubin: 0.5 mg/dL (ref 0.3–1.2)
Total Protein: 7.5 g/dL (ref 6.5–8.1)

## 2018-02-11 LAB — URINALYSIS, COMPLETE (UACMP) WITH MICROSCOPIC
Bacteria, UA: NONE SEEN
Bilirubin Urine: NEGATIVE
Glucose, UA: NEGATIVE mg/dL
Hgb urine dipstick: NEGATIVE
Ketones, ur: NEGATIVE mg/dL
Leukocytes, UA: NEGATIVE
Nitrite: NEGATIVE
Protein, ur: NEGATIVE mg/dL
Specific Gravity, Urine: 1.02 (ref 1.005–1.030)
pH: 6 (ref 5.0–8.0)

## 2018-02-11 LAB — LIPASE, BLOOD: Lipase: 27 U/L (ref 11–51)

## 2018-02-11 LAB — POCT PREGNANCY, URINE: Preg Test, Ur: NEGATIVE

## 2018-02-11 LAB — PREGNANCY, URINE: Preg Test, Ur: NEGATIVE

## 2018-02-11 MED ORDER — OXYCODONE-ACETAMINOPHEN 5-325 MG PO TABS
1.0000 | ORAL_TABLET | Freq: Once | ORAL | Status: AC
  Administered 2018-02-11: 1 via ORAL
  Filled 2018-02-11: qty 1

## 2018-02-11 MED ORDER — OXYCODONE-ACETAMINOPHEN 5-325 MG PO TABS: 1.0000 | ORAL_TABLET | ORAL | 0 refills | Status: DC | PRN

## 2018-02-11 MED ORDER — PROMETHAZINE HCL 25 MG PO TABS
25.0000 mg | ORAL_TABLET | Freq: Once | ORAL | Status: AC
  Administered 2018-02-11: 25 mg via ORAL
  Filled 2018-02-11: qty 1

## 2018-02-11 NOTE — ED Triage Notes (Signed)
Pt c/o RUQ pain for the past 3 months , states she has surgery for cholecystectomy next month.. States she was referred to the ED for her pain. Pt is in NAD on arrival.

## 2018-02-11 NOTE — ED Notes (Signed)
Pt states she has gall bladder issues and has been having problems x 2 days. Worse today. She has taken Phenergan earlier without any relief. She c/o cramping, loss of appetite and pain

## 2018-02-11 NOTE — ED Provider Notes (Signed)
[facility] Emergency Department Provider Note  ____________________________________________   First MD Initiated Contact with Patient 02/11/18 2008     (approximate)  I have reviewed the triage vital signs and the nursing notes.   HISTORY  Chief Complaint Abdominal Pain   HPI Andrea Miller is a 32 y.o. female with history of bipolar disorder as well as cholelithiasis was presenting with right upper quadrant pain radiating through to her back. She says the pain has been off and on for several months now but says that it worsened this past Friday. She says that she has had nausea but without vomiting. No diarrhea. Says that she is scheduled to have her gallbladder taken out next month and that this pain feels similar to her gallbladder pain. Sleep. Denies any fever. Denies any chills. Says that she has been taking Phenergan at home as well as ibuprofen and "Topamax" for pain.   Past Medical History:  Diagnosis Date  . Anxiety   . Asthma   . Bipolar disorder (HCC)   . Chest pain    and angina  . Depression   . Fibromyalgia   . Hypertension   . 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 Obesity   . Seizures (HCC)    secondary to anxiety  . SVT (supraventricular tachycardia) (HCC)    with hx of syncope; managed by [facility]    Patient Active Problem List   Diagnosis Date Noted  . Asthma exacerbation 04/14/2017  . Asthma without status asthmaticus 01/16/2017  . Bipolar affective (HCC) 01/16/2017  . Coronary artery disease 01/16/2017  . Fibromyalgia 04/27/2014  . OSA (obstructive sleep apnea) 04/27/2014  . Seizure-like activity (HCC) 04/27/2014  . Obesity 04/15/2014    Past Surgical History:  Procedure Laterality Date  . NO PAST SURGERIES    . TONSILLECTOMY AND ADENOIDECTOMY N/A 09/05/2016   Procedure: TONSILLECTOMY AND ADENOIDECTOMY;  Surgeon: Linus Salmons, MD;  Location: [facility] ORS;  Service: ENT;   Laterality: N/A;    Prior to Admission medications   Medication Sig Start Date End Date Taking? Authorizing Provider  albuterol (PROVENTIL HFA;VENTOLIN HFA) 108 (90 Base) MCG/ACT inhaler Inhale 2 puffs into the lungs every 6 (six) hours as needed for wheezing or shortness of breath. 01/31/18   Merwyn Katos, MD  albuterol (PROVENTIL) (2.5 MG/3ML) 0.083% nebulizer solution Take 2.5 mg by nebulization every 4 (four) hours as needed for wheezing or shortness of breath.    [provider]  amphetamine-dextroamphetamine (ADDERALL XR) 15 MG 24 hr capsule Take 15 mg by mouth every morning.    [provider]  budesonide-formoterol (SYMBICORT) 160-4.5 MCG/ACT inhaler Inhale 2 puffs into the lungs 2 (two) times daily. 01/31/18   Merwyn Katos, MD  carbamazepine (TEGRETOL) 200 MG tablet Take 200 mg by mouth daily. 03/26/17   [provider]  clonazePAM (KLONOPIN) 2 MG tablet Take 2 mg by mouth at bedtime as needed for anxiety.    [provider]  diltiazem (CARDIZEM CD) 360 MG 24 hr capsule Take 360 mg by mouth daily.     [provider]  fluticasone (FLONASE) 50 MCG/ACT nasal spray Place 2 sprays into both nostrils daily.    [provider]  folic acid (FOLVITE) 1 MG tablet Take 1 mg by mouth daily.  12/15/16   [provider]  furosemide (LASIX) 20 MG tablet Take 20 mg by mouth every morning.    [provider]  gabapentin (NEURONTIN) 300 MG capsule Take 300 mg by mouth 3 (three) times daily.    [provider]  hydrochlorothiazide (HYDRODIURIL) 25 MG tablet Take 25 mg by mouth daily. 03/03/17   [provider]  ibuprofen (ADVIL,MOTRIN) 600 MG tablet Take 1 tablet (600 mg total) by mouth every 6 (six) hours as needed. 12/22/16   Joni Reining, PA-C  montelukast (SINGULAIR) 10 MG tablet Take 1 tablet (10 mg total) by mouth at bedtime. 01/31/18 01/31/19  Merwyn Katos, MD  ondansetron (ZOFRAN ODT) 4 MG disintegrating  tablet Take 1 tablet (4 mg total) by mouth every 8 (eight) hours as needed for nausea or vomiting. 10/31/17   Nita Sickle, MD  potassium chloride (K-DUR) 10 MEQ tablet Take 10 mEq by mouth every morning.    [provider]  propranolol (INDERAL) 20 MG tablet Take 1 tablet by mouth 3 (three) times daily. 11/23/17 11/23/18  [provider]  tiZANidine (ZANAFLEX) 4 MG tablet Take 4 mg by mouth 3 (three) times daily as needed. 03/26/17   [provider]    Allergies Cortizone-10 [hydrocortisone]; Garlic; Penicillins; and Corticosteroids  Family History  Problem Relation Age of Onset  . Diabetes Mother   . Hypertension Mother   . Asthma Mother   . Diabetes Father   . Hypertension Father     Social History Social History   Tobacco Use  . Smoking status: Never Smoker  . Smokeless tobacco: Never Used  Substance Use Topics  . Alcohol use: No  . Drug use: No    Review of Systems  Constitutional: No fever/chills Eyes: No visual changes. ENT: No sore throat. Cardiovascular: Denies chest pain. Respiratory: Denies shortness of breath. Gastrointestinal: no vomiting.  No diarrhea.  No constipation. Genitourinary: Negative for dysuria. Musculoskeletal: Negative for back pain. Skin: Negative for rash. Neurological: Negative for headaches, focal weakness or numbness.   ____________________________________________   PHYSICAL EXAM:  VITAL SIGNS: ED Triage Vitals [02/11/18 1758]  Enc Vitals Group     BP 132/90     Pulse Rate 100     Resp 17     Temp 98.5 F (36.9 C)     Temp Source Oral     SpO2 97 %     Weight (!) 368 lb (166.9 kg)     Height 5\' 9"  (1.753 m)     Head Circumference      Peak Flow      Pain Score 10     Pain Loc      Pain Edu?      Excl. in GC?     Constitutional: Alert and oriented. Well appearing and in no acute distress. Eyes: Conjunctivae are normal.  Head: Atraumatic. Nose: No congestion/rhinnorhea. Mouth/Throat:  Mucous membranes are moist.  Neck: No stridor.   Cardiovascular: Normal rate, regular rhythm. Grossly normal heart sounds.  Respiratory: Normal respiratory effort.  No retractions. Lungs CTAB. Gastrointestinal: Softwith minimal right upper quadrant tenderness with a negative Murphy sign. No distention. No CVA tenderness. Musculoskeletal: No lower extremity tenderness nor edema.  No joint effusions. Neurologic:  Normal speech and language. No gross focal neurologic deficits are appreciated. Skin:  Skin is warm, dry and intact. No rash noted. Psychiatric: Mood and affect are normal. Speech and behavior are normal.  ____________________________________________   LABS (all labs ordered are listed, but only abnormal results are displayed)  Labs Reviewed  COMPREHENSIVE METABOLIC PANEL - Abnormal; Notable for the following components:      Result  Value   Calcium 8.7 (*)    ALT 10 (*)    All other components within normal limits  CBC - Abnormal; Notable for the following components:   MCHC 31.9 (*)    RDW 14.8 (*)    All other components within normal limits  URINALYSIS, COMPLETE (UACMP) WITH MICROSCOPIC - Abnormal; Notable for the following components:   Color, Urine YELLOW (*)    APPearance HAZY (*)    Squamous Epithelial / LPF 0-5 (*)    All other components within normal limits  LIPASE, BLOOD  PREGNANCY, URINE  POCT PREGNANCY, URINE   ____________________________________________  EKG   ____________________________________________  RADIOLOGY  patient 11 mm nonobstructing calculus. Positive sonographic Murphy sign elicited. However, less apparent sludge in the gallbladder. No noted wall thickening or pericholecystic fluid. _______________________________________   PROCEDURES  Procedure(s) performed:   Procedures  Critical Care performed:   ____________________________________________   INITIAL IMPRESSION / ASSESSMENT AND PLAN / ED COURSE  Pertinent labs & imaging  results that were available during my care of the patient were reviewed by me and considered in my medical decision making (see chart for details).  Differential diagnosis includes, but is not limited to, biliary disease (biliary colic, acute cholecystitis, cholangitis, choledocholithiasis, etc), intrathoracic causes for epigastric abdominal pain including ACS, gastritis, duodenitis, pancreatitis, small bowel or large bowel obstruction, abdominal aortic aneurysm, hernia, and gastritis. As part of my medical decision making, I reviewed the following data within the electronic MEDICAL RECORD NUMBER Notes from prior ED visits  ----------------------------------------- 11:42 PM on 02/11/2018 -----------------------------------------  I discussed the case Dr. Everlene Farrier of surgery because of the sonographic Murphy sign elicited by recent [facility] refer. I reassessed the patient and she has a negative Murphy sign per my exam. Also without any distress. Requesting water to drink. reassuring lab work.Surgery consultant says for the patient to call the office tomorrow so that she may ask that the surgery and be seen in the office within the next 1-2 days. Patient will be discharged with Percocet. She is understanding of this plan and wanted to comply. ____________________________________________   FINAL CLINICAL IMPRESSION(S) / ED DIAGNOSES  biliary colic. cholelithiasis.    NEW MEDICATIONS STARTED DURING THIS VISIT:  New Prescriptions   No medications on file     Note:  This document was prepared using Dragon voice recognition software and may include unintentional dictation errors.     Myrna Blazer, MD 02/11/18 [phone]

## 2018-02-12 ENCOUNTER — Telehealth: Payer: Self-pay | Admitting: Surgery

## 2018-02-12 NOTE — Telephone Encounter (Signed)
Returned patients voicemail requesting appointment with Dr. Everlene Farrier. No answer and no option for voicemail

## 2018-02-13 ENCOUNTER — Encounter: Payer: Self-pay | Admitting: Surgery

## 2018-02-13 ENCOUNTER — Ambulatory Visit (INDEPENDENT_AMBULATORY_CARE_PROVIDER_SITE_OTHER): Payer: BLUE CROSS/BLUE SHIELD | Admitting: Surgery

## 2018-02-13 ENCOUNTER — Telehealth: Payer: Self-pay

## 2018-02-13 VITALS — BP 111/79 | HR 108 | Temp 98.5°F | Resp 14 | Ht 70.0 in | Wt 371.0 lb

## 2018-02-13 DIAGNOSIS — K8020 Calculus of gallbladder without cholecystitis without obstruction: Secondary | ICD-10-CM | POA: Diagnosis not present

## 2018-02-13 DIAGNOSIS — E119 Type 2 diabetes mellitus without complications: Secondary | ICD-10-CM | POA: Insufficient documentation

## 2018-02-13 DIAGNOSIS — R609 Edema, unspecified: Secondary | ICD-10-CM | POA: Insufficient documentation

## 2018-02-13 NOTE — Telephone Encounter (Signed)
Medical clearance was faxed to Andrea Miller. Awaiting for response.

## 2018-02-13 NOTE — Patient Instructions (Addendum)
You are scheduled for surgery at [facility] on 02/22/2018 with Dr Earlene Plater.  Please refer to your [facility] Sheet for surgery instructions.  We are going to request a Medical Clearance from your primary care provider.     Laparoscopic Cholecystectomy Laparoscopic cholecystectomy is surgery to remove the gallbladder. The gallbladder is a pear-shaped organ that lies beneath the liver on the right side of the body. The gallbladder stores bile, which is a fluid that helps the body to digest fats. Cholecystectomy is often done for inflammation of the gallbladder (cholecystitis). This condition is usually caused by a buildup of gallstones (cholelithiasis) in the gallbladder. Gallstones can block the flow of bile, which can result in inflammation and pain. In severe cases, emergency surgery may be required. This procedure is done though small incisions in your abdomen (laparoscopic surgery). A thin scope with a camera (laparoscope) is inserted through one incision. Thin surgical instruments are inserted through the other incisions. In some cases, a laparoscopic procedure may be turned into a type of surgery that is done through a larger incision (open surgery). Tell a health care provider about:  Any allergies you have.  All medicines you are taking, including vitamins, herbs, eye drops, creams, and over-the-counter medicines.  Any problems you or family members have had with anesthetic medicines.  Any blood disorders you have.  Any surgeries you have had.  Any medical conditions you have.  Whether you are pregnant or may be pregnant. What are the risks? Generally, this is a safe procedure. However, problems may occur, including:  Infection.  Bleeding.  Allergic reactions to medicines.  Damage to other structures or organs.  A stone remaining in the common bile duct. The common bile duct carries bile from the gallbladder into the small intestine.  A bile leak from the cyst duct that is clipped when  your gallbladder is removed.  What happens before the procedure? Staying hydrated Follow instructions from your health care provider about hydration, which may include:  Up to 2 hours before the procedure - you may continue to drink clear liquids, such as water, clear fruit juice, black coffee, and plain tea.  Eating and drinking restrictions Follow instructions from your health care provider about eating and drinking, which may include:  8 hours before the procedure - stop eating heavy meals or foods such as meat, fried foods, or fatty foods.  6 hours before the procedure - stop eating light meals or foods, such as toast or cereal.  6 hours before the procedure - stop drinking milk or drinks that contain milk.  2 hours before the procedure - stop drinking clear liquids.  Medicines  Ask your health care provider about: ? Changing or stopping your regular medicines. This is especially important if you are taking diabetes medicines or blood thinners. ? Taking medicines such as aspirin and ibuprofen. These medicines can thin your blood. Do not take these medicines before your procedure if your health care provider instructs you not to.  You may be given antibiotic medicine to help prevent infection. General instructions  Let your health care provider know if you develop a cold or an infection before surgery.  Plan to have someone take you home from the hospital or clinic.  Ask your health care provider how your surgical site will be marked or identified. What happens during the procedure?  To reduce your risk of infection: ? Your health care team will wash or sanitize their hands. ? Your skin will be washed with soap. ?  Hair may be removed from the surgical area.  An IV tube may be inserted into one of your veins.  You will be given one or more of the following: ? A medicine to help you relax (sedative). ? A medicine to make you fall asleep (general anesthetic).  A  breathing tube will be placed in your mouth.  Your surgeon will make several small cuts (incisions) in your abdomen.  The laparoscope will be inserted through one of the small incisions. The camera on the laparoscope will send images to a TV screen (monitor) in the operating room. This lets your surgeon see inside your abdomen.  Air-like gas will be pumped into your abdomen. This will expand your abdomen to give the surgeon more room to perform the surgery.  Other tools that are needed for the procedure will be inserted through the other incisions. The gallbladder will be removed through one of the incisions.  Your common bile duct may be examined. If stones are found in the common bile duct, they may be removed.  After your gallbladder has been removed, the incisions will be closed with stitches (sutures), staples, or skin glue.  Your incisions may be covered with a bandage (dressing). The procedure may vary among health care providers and hospitals. What happens after the procedure?  Your blood pressure, heart rate, breathing rate, and blood oxygen level will be monitored until the medicines you were given have worn off.  You will be given medicines as needed to control your pain.  Do not drive for 24 hours if you were given a sedative. This information is not intended to replace advice given to you by your health care provider. Make sure you discuss any questions you have with your health care provider. Document Released: 11/13/2005 Document Revised: 06/04/2016 Document Reviewed: 05/01/2016 Elsevier Interactive Patient Education  2018 [employer].

## 2018-02-14 ENCOUNTER — Encounter: Payer: Self-pay | Admitting: Surgery

## 2018-02-14 NOTE — Progress Notes (Signed)
Surgical Clinic History and Physical  Referring provider:  Emogene Morgan, MD [address] [place], [place] [address]  HISTORY OF PRESENT ILLNESS (HPI):  32 y.o. female presents for evaluation of RUQ abdominal pain and N/V. Patient reports she first experienced post-prandial RUQ abdominal pain several months ago, worsened with higher-fat foods, but she attributed her pain to her chronic fibromyalgia until she developed severe nausea without emesis, for which she's been prescribed phenergan and was prescribed ibuprofen + topamax for pain by pain medicine clinic. Patient was apparently offered cholecystectomy in one month, but presented to [facility] ED stating she can't wait that long. Patient otherwise denies any fever/chills, CP, or SOB. Of note, patient has recently been evaluated by pulmonology for her chronic and stable asthma (3/7) and has underwent extensive testing for chronic sinus tachycardia (including multiple unrevealing stress tests and echocardiograms at [facility]) and was most recently evaluated by [facility] cardiology (Dr. Ritta Slot, MD, 11/23/2017).  PAST MEDICAL HISTORY (PMH):  Past Medical History:  Diagnosis Date  . Anxiety   . Asthma   . Bipolar disorder (HCC)   . Chest pain    and angina  . Depression   . Fibromyalgia   . Hypertension   . 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 Obesity   . Seizures (HCC)    secondary to anxiety  . SVT (supraventricular tachycardia) (HCC)    with hx of syncope; managed by [facility]     PAST SURGICAL HISTORY [facility]):  Past Surgical History:  Procedure Laterality Date  . TONSILLECTOMY AND ADENOIDECTOMY N/A 09/05/2016   Procedure: TONSILLECTOMY AND ADENOIDECTOMY;  Surgeon: Linus Salmons, MD;  Location: [facility] ORS;  Service: ENT;  Laterality: N/A;     MEDICATIONS:  Prior to Admission medications   Medication Sig Start Date End Date Taking? Authorizing Provider  albuterol (PROVENTIL  HFA;VENTOLIN HFA) 108 (90 Base) MCG/ACT inhaler Inhale 2 puffs into the lungs every 6 (six) hours as needed for wheezing or shortness of breath. 01/31/18  Yes Merwyn Katos, MD  albuterol (PROVENTIL) (2.5 MG/3ML) 0.083% nebulizer solution Take 2.5 mg by nebulization every 4 (four) hours as needed for wheezing or shortness of breath.   Yes [provider]  budesonide-formoterol (SYMBICORT) 160-4.5 MCG/ACT inhaler Inhale 2 puffs into the lungs 2 (two) times daily. 01/31/18  Yes Merwyn Katos, MD  carbamazepine (TEGRETOL) 200 MG tablet Take 200 mg by mouth daily. 03/26/17  Yes [provider]  clonazePAM (KLONOPIN) 2 MG tablet Take 2 mg by mouth at bedtime as needed for anxiety.   Yes [provider]  diltiazem (CARDIZEM CD) 360 MG 24 hr capsule Take 360 mg by mouth daily.    Yes [provider]  fluticasone (FLONASE) 50 MCG/ACT nasal spray Place 2 sprays into both nostrils daily.   Yes [provider]  folic acid (FOLVITE) 1 MG tablet Take 1 mg by mouth daily.  12/15/16  Yes [provider]  furosemide (LASIX) 20 MG tablet Take 20 mg by mouth every morning.   Yes [provider]  gabapentin (NEURONTIN) 300 MG capsule Take 300 mg by mouth 3 (three) times daily.   Yes [provider]  hydrochlorothiazide (HYDRODIURIL) 25 MG tablet Take 25 mg by mouth daily. 03/03/17  Yes [provider]  ibuprofen (ADVIL,MOTRIN) 600 MG tablet Take 1 tablet (600 mg total) by mouth every 6 (six) hours as needed. 12/22/16  Yes Joni Reining, PA-C  linaclotide (LINZESS) 290 MCG CAPS capsule Take 290 mcg by mouth daily before breakfast.   Yes [provider]  montelukast (SINGULAIR) 10 MG tablet Take 1 tablet (10 mg total) by mouth at bedtime. 01/31/18 01/31/19 Yes Merwyn Katos, MD  oxyCODONE-acetaminophen (PERCOCET) 5-325 MG tablet Take 1 tablet by mouth every 4 (four) hours as needed for moderate pain or severe pain. 02/11/18  Yes  Schaevitz, Myra Rude, MD  potassium chloride (K-DUR) 10 MEQ tablet Take 10 mEq by mouth every morning.   Yes [provider]  promethazine (PHENERGAN) 12.5 MG tablet Take 12.5 mg by mouth every 6 (six) hours as needed for nausea or vomiting.   Yes [provider]  propranolol (INDERAL) 20 MG tablet Take 1 tablet by mouth 3 (three) times daily. 11/23/17 11/23/18 Yes [provider]  tiZANidine (ZANAFLEX) 4 MG tablet Take 4 mg by mouth 3 (three) times daily as needed. 03/26/17  Yes [provider]     ALLERGIES:  Allergies  Allergen Reactions  . Cortizone-10 [Hydrocortisone] Shortness Of Breath and Swelling  . Garlic Anaphylaxis  . Penicillins     Has patient had a PCN reaction causing immediate rash, facial/tongue/throat swelling, SOB or lightheadedness with hypotension:Yes Has patient had a PCN reaction causing severe rash involving mucus membranes or skin necrosis:No Has patient had a PCN reaction that required hospitalization:No Has patient had a PCN reaction occurring within the last 10 years:Yes. If all of the above answers are "NO", then may proceed with Cephalosporin use.   . Corticosteroids Palpitations     SOCIAL HISTORY:  Social History   Socioeconomic History  . Marital status: Divorced    Spouse name: Not on file  . Number of children: Not on file  . Years of education: Not on file  . Highest education level: Not on file  Occupational History  . Not on file  Social Needs  . Financial resource strain: Not on file  . Food insecurity:    Worry: Not on file    Inability: Not on file  . Transportation needs:    Medical: Not on file    Non-medical: Not on file  Tobacco Use  . Smoking status: Never Smoker  . Smokeless tobacco: Never Used  Substance and Sexual Activity  . Alcohol use: No  . Drug use: No  . Sexual activity: Yes    Birth control/protection: None  Lifestyle  . Physical activity:    Days per week: Not on file     Minutes per session: Not on file  . Stress: Not on file  Relationships  . Social connections:    Talks on phone: Not on file    Gets together: Not on file    Attends religious service: Not on file    Active member of club or organization: Not on file    Attends meetings of clubs or organizations: Not on file    Relationship status: Not on file  . Intimate partner violence:    Fear of current or ex partner: Not on file    Emotionally abused: Not on file    Physically abused: Not on file    Forced sexual activity: Not on file  Other Topics Concern  . Not on file  Social History Narrative  . Not on file    The patient currently resides (home / rehab facility / nursing home): Home The patient normally is (ambulatory / bedbound): Ambulatory  FAMILY HISTORY:  Family History  Problem Relation Age  of Onset  . Diabetes Mother   . Hypertension Mother   . Asthma Mother   . Gallstones Mother   . Diabetes Father   . Hypertension Father   . Gallstones Father     Otherwise negative/non-contributory.  REVIEW OF SYSTEMS:  Constitutional: denies any other weight loss, fever, chills, or sweats  Eyes: denies any other vision changes, history of eye injury  ENT: denies sore throat, hearing problems  Respiratory: denies shortness of breath, wheezing  Cardiovascular: denies chest pain, palpitations  Gastrointestinal: abdominal pain, N/V, and bowel function as per HPI Musculoskeletal: denies any other joint pains or cramps  Skin: Denies any other rashes or skin discolorations Neurological: denies any other headache, dizziness, weakness  Psychiatric: Denies any other depression, anxiety   All other review of systems were otherwise negative   VITAL SIGNS:  BP 111/79   Pulse (!) 108   Temp 98.5 F (36.9 C)   Resp 14   Ht 5\' 10"  (1.778 m)   Wt (!) 371 lb (168.3 kg)   LMP 01/23/2018 (Approximate)   BMI 53.23 kg/m   PHYSICAL EXAM:  Constitutional:  -- Morbidly obese body habitus   -- Awake, alert, and oriented x3 -- Overall flat affect Eyes:  -- Pupils equally round and reactive to light  -- No scleral icterus  Ear, nose, throat:  -- No jugular venous distension -- No nasal drainage, bleeding Pulmonary:  -- No crackles  -- Equal breath sounds bilaterally -- Breathing non-labored at rest Cardiovascular:  -- S1, S2 present  -- No pericardial rubs  Gastrointestinal:  -- Abdomen soft, morbidly obese, but non-distended, with mild-/moderate- RUQ abdominal tenderness to palpation, no guarding/rebound tenderness to palpation  -- No abdominal masses appreciated, pulsatile or otherwise  Musculoskeletal and Integumentary:  -- Wounds or skin discoloration: None appreciated -- Extremities: B/L UE and LE FROM, hands and feet warm  Neurologic:  -- Motor function: Intact and symmetric -- Sensation: Intact and symmetric  Labs:  CBC Latest Ref Rng & Units 02/11/2018 10/31/2017 08/30/2017  WBC 3.6 - 11.0 K/uL 10.4 5.8 3.8  Hemoglobin 12.0 - 16.0 g/dL 91.9 16.6 06.0  Hematocrit 35.0 - 47.0 % 40.0 43.1 38.5  Platelets 150 - 440 K/uL 255 233 260   CMP Latest Ref Rng & Units 02/11/2018 10/31/2017 08/30/2017  Glucose 65 - 99 mg/dL 98 045(T) 97  BUN 6 - 20 mg/dL 9 9 7   Creatinine 0.44 - 1.00 mg/dL 9.77 4.14 2.39  Sodium 135 - 145 mmol/L 135 136 141  Potassium 3.5 - 5.1 mmol/L 3.5 2.9(L) 3.1(L)  Chloride 101 - 111 mmol/L 104 93(L) 103  CO2 22 - 32 mmol/L 23 27 26   Calcium 8.9 - 10.3 mg/dL 5.3(U) 10.4(H) 9.3  Total Protein 6.5 - 8.1 g/dL 7.5 - 7.9  Total Bilirubin 0.3 - 1.2 mg/dL 0.5 - 0.6  Alkaline Phos 38 - 126 U/L 73 - 80  AST 15 - 41 U/L 20 - 18  ALT 14 - 54 U/L 10(L) - 10(L)    Imaging studies:  Limited RUQ Abdominal Ultrasound (02/11/2018) 1. 11 mm nonobstructing calculus is noted within a nondistended gallbladder. Positive sonographic Eulah Pont sign was elicited however. Previously noted tumefactive biliary sludge within the lumen gallbladder is less apparent and may  have passed. 2. 8 mm echogenic focus in the right kidney without shadowing suspicious for small angiomyolipoma or focus of fat.  Complete Abdominal Ultrasound (12/31/2017) 1. Diffusely echogenic gallbladder, likely due to combination of sludge and stones. No associated  findings of acute cholecystitis. 2. Liver appears steatotic.   Assessment/Plan:  32 y.o. female with symptomatic cholelithiasis, complicated by co-morbidities including morbid obesity (BMI >53), DM, HTN, chronic idiopathic sinus tachycardia, asthma, OSA not treated with CPAP, fibromyalgia and chronic Left lumbar radiculopathy (pain managed by pain medicine clinic), bipolar disorder, generalized anxiety disorder, and major depression disorder.              - avoid/minimize foods with higher fat content (meats, cheeses/dairy, and fried)             - prefer low-fat vegetables, whole grains (wheat bread, ceareals, etc), and fruits until cholecystectomy              - all risks, benefits, and alternatives to cholecystectomy were discussed with the patient, all of his questions were answered to his expressed satisfaction, patient expresses she wishes to proceed, and informed consent was obtained accordingly.             - will plan for laparoscopic cholecystectomy next Friday 3/29 per patient request  - discussed with patient and encouraged significant weight loss, including consideration for bariatric weight loss program and surgery (considering patient's young age), for which patient expresses interest and for which printed information and seminar schedules were provided  - though patient has underwent extensive medical workup and management for her young age, will communicate with primary care regarding risk stratification and optimization             - anticipate return to clinic 2 weeks after above planned surgery             - instructed to call if any questions or concerns  All of the above recommendations were discussed with the  patient and patient's family, and all of patient's and family's questions were answered to their expressed satisfaction.  -- Scherrie Gerlach Earlene Plater, MD, RPVI [place]: [facility] - Partnering for exceptional care. Office: [phone]

## 2018-02-14 NOTE — H&P (View-Only) (Signed)
Surgical Clinic History and Physical  Referring provider:  Emogene Morgan, MD [address] [place], [place] [address]  HISTORY OF PRESENT ILLNESS (HPI):  32 y.o. female presents for evaluation of RUQ abdominal pain and N/V. Patient reports she first experienced post-prandial RUQ abdominal pain several months ago, worsened with higher-fat foods, but she attributed her pain to her chronic fibromyalgia until she developed severe nausea without emesis, for which she's been prescribed phenergan and was prescribed ibuprofen + topamax for pain by pain medicine clinic. Patient was apparently offered cholecystectomy in one month, but presented to [facility] ED stating she can't wait that long. Patient otherwise denies any fever/chills, CP, or SOB. Of note, patient has recently been evaluated by pulmonology for her chronic and stable asthma (3/7) and has underwent extensive testing for chronic sinus tachycardia (including multiple unrevealing stress tests and echocardiograms at [facility]) and was most recently evaluated by [facility] cardiology (Dr. Ritta Slot, MD, 11/23/2017).  PAST MEDICAL HISTORY (PMH):  Past Medical History:  Diagnosis Date  . Anxiety   . Asthma   . Bipolar disorder (HCC)   . Chest pain    and angina  . Depression   . Fibromyalgia   . Hypertension   . 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 Obesity   . Seizures (HCC)    secondary to anxiety  . SVT (supraventricular tachycardia) (HCC)    with hx of syncope; managed by [facility]     PAST SURGICAL HISTORY [facility]):  Past Surgical History:  Procedure Laterality Date  . TONSILLECTOMY AND ADENOIDECTOMY N/A 09/05/2016   Procedure: TONSILLECTOMY AND ADENOIDECTOMY;  Surgeon: Linus Salmons, MD;  Location: [facility] ORS;  Service: ENT;  Laterality: N/A;     MEDICATIONS:  Prior to Admission medications   Medication Sig Start Date End Date Taking? Authorizing Provider  albuterol (PROVENTIL  HFA;VENTOLIN HFA) 108 (90 Base) MCG/ACT inhaler Inhale 2 puffs into the lungs every 6 (six) hours as needed for wheezing or shortness of breath. 01/31/18  Yes Merwyn Katos, MD  albuterol (PROVENTIL) (2.5 MG/3ML) 0.083% nebulizer solution Take 2.5 mg by nebulization every 4 (four) hours as needed for wheezing or shortness of breath.   Yes [provider]  budesonide-formoterol (SYMBICORT) 160-4.5 MCG/ACT inhaler Inhale 2 puffs into the lungs 2 (two) times daily. 01/31/18  Yes Merwyn Katos, MD  carbamazepine (TEGRETOL) 200 MG tablet Take 200 mg by mouth daily. 03/26/17  Yes [provider]  clonazePAM (KLONOPIN) 2 MG tablet Take 2 mg by mouth at bedtime as needed for anxiety.   Yes [provider]  diltiazem (CARDIZEM CD) 360 MG 24 hr capsule Take 360 mg by mouth daily.    Yes [provider]  fluticasone (FLONASE) 50 MCG/ACT nasal spray Place 2 sprays into both nostrils daily.   Yes [provider]  folic acid (FOLVITE) 1 MG tablet Take 1 mg by mouth daily.  12/15/16  Yes [provider]  furosemide (LASIX) 20 MG tablet Take 20 mg by mouth every morning.   Yes [provider]  gabapentin (NEURONTIN) 300 MG capsule Take 300 mg by mouth 3 (three) times daily.   Yes [provider]  hydrochlorothiazide (HYDRODIURIL) 25 MG tablet Take 25 mg by mouth daily. 03/03/17  Yes [provider]  ibuprofen (ADVIL,MOTRIN) 600 MG tablet Take 1 tablet (600 mg total) by mouth every 6 (six) hours as needed. 12/22/16  Yes Joni Reining, PA-C  linaclotide (LINZESS) 290 MCG CAPS capsule Take 290 mcg by mouth daily before breakfast.   Yes [provider]  montelukast (SINGULAIR) 10 MG tablet Take 1 tablet (10 mg total) by mouth at bedtime. 01/31/18 01/31/19 Yes Merwyn Katos, MD  oxyCODONE-acetaminophen (PERCOCET) 5-325 MG tablet Take 1 tablet by mouth every 4 (four) hours as needed for moderate pain or severe pain. 02/11/18  Yes  Schaevitz, Myra Rude, MD  potassium chloride (K-DUR) 10 MEQ tablet Take 10 mEq by mouth every morning.   Yes [provider]  promethazine (PHENERGAN) 12.5 MG tablet Take 12.5 mg by mouth every 6 (six) hours as needed for nausea or vomiting.   Yes [provider]  propranolol (INDERAL) 20 MG tablet Take 1 tablet by mouth 3 (three) times daily. 11/23/17 11/23/18 Yes [provider]  tiZANidine (ZANAFLEX) 4 MG tablet Take 4 mg by mouth 3 (three) times daily as needed. 03/26/17  Yes [provider]     ALLERGIES:  Allergies  Allergen Reactions  . Cortizone-10 [Hydrocortisone] Shortness Of Breath and Swelling  . Garlic Anaphylaxis  . Penicillins     Has patient had a PCN reaction causing immediate rash, facial/tongue/throat swelling, SOB or lightheadedness with hypotension:Yes Has patient had a PCN reaction causing severe rash involving mucus membranes or skin necrosis:No Has patient had a PCN reaction that required hospitalization:No Has patient had a PCN reaction occurring within the last 10 years:Yes. If all of the above answers are "NO", then may proceed with Cephalosporin use.   . Corticosteroids Palpitations     SOCIAL HISTORY:  Social History   Socioeconomic History  . Marital status: Divorced    Spouse name: Not on file  . Number of children: Not on file  . Years of education: Not on file  . Highest education level: Not on file  Occupational History  . Not on file  Social Needs  . Financial resource strain: Not on file  . Food insecurity:    Worry: Not on file    Inability: Not on file  . Transportation needs:    Medical: Not on file    Non-medical: Not on file  Tobacco Use  . Smoking status: Never Smoker  . Smokeless tobacco: Never Used  Substance and Sexual Activity  . Alcohol use: No  . Drug use: No  . Sexual activity: Yes    Birth control/protection: None  Lifestyle  . Physical activity:    Days per week: Not on file     Minutes per session: Not on file  . Stress: Not on file  Relationships  . Social connections:    Talks on phone: Not on file    Gets together: Not on file    Attends religious service: Not on file    Active member of club or organization: Not on file    Attends meetings of clubs or organizations: Not on file    Relationship status: Not on file  . Intimate partner violence:    Fear of current or ex partner: Not on file    Emotionally abused: Not on file    Physically abused: Not on file    Forced sexual activity: Not on file  Other Topics Concern  . Not on file  Social History Narrative  . Not on file    The patient currently resides (home / rehab facility / nursing home): Home The patient normally is (ambulatory / bedbound): Ambulatory  FAMILY HISTORY:  Family History  Problem Relation Age  of Onset  . Diabetes Mother   . Hypertension Mother   . Asthma Mother   . Gallstones Mother   . Diabetes Father   . Hypertension Father   . Gallstones Father     Otherwise negative/non-contributory.  REVIEW OF SYSTEMS:  Constitutional: denies any other weight loss, fever, chills, or sweats  Eyes: denies any other vision changes, history of eye injury  ENT: denies sore throat, hearing problems  Respiratory: denies shortness of breath, wheezing  Cardiovascular: denies chest pain, palpitations  Gastrointestinal: abdominal pain, N/V, and bowel function as per HPI Musculoskeletal: denies any other joint pains or cramps  Skin: Denies any other rashes or skin discolorations Neurological: denies any other headache, dizziness, weakness  Psychiatric: Denies any other depression, anxiety   All other review of systems were otherwise negative   VITAL SIGNS:  BP 111/79   Pulse (!) 108   Temp 98.5 F (36.9 C)   Resp 14   Ht 5\' 10"  (1.778 m)   Wt (!) 371 lb (168.3 kg)   LMP 01/23/2018 (Approximate)   BMI 53.23 kg/m   PHYSICAL EXAM:  Constitutional:  -- Morbidly obese body habitus   -- Awake, alert, and oriented x3 -- Overall flat affect Eyes:  -- Pupils equally round and reactive to light  -- No scleral icterus  Ear, nose, throat:  -- No jugular venous distension -- No nasal drainage, bleeding Pulmonary:  -- No crackles  -- Equal breath sounds bilaterally -- Breathing non-labored at rest Cardiovascular:  -- S1, S2 present  -- No pericardial rubs  Gastrointestinal:  -- Abdomen soft, morbidly obese, but non-distended, with mild-/moderate- RUQ abdominal tenderness to palpation, no guarding/rebound tenderness to palpation  -- No abdominal masses appreciated, pulsatile or otherwise  Musculoskeletal and Integumentary:  -- Wounds or skin discoloration: None appreciated -- Extremities: B/L UE and LE FROM, hands and feet warm  Neurologic:  -- Motor function: Intact and symmetric -- Sensation: Intact and symmetric  Labs:  CBC Latest Ref Rng & Units 02/11/2018 10/31/2017 08/30/2017  WBC 3.6 - 11.0 K/uL 10.4 5.8 3.8  Hemoglobin 12.0 - 16.0 g/dL 91.9 16.6 06.0  Hematocrit 35.0 - 47.0 % 40.0 43.1 38.5  Platelets 150 - 440 K/uL 255 233 260   CMP Latest Ref Rng & Units 02/11/2018 10/31/2017 08/30/2017  Glucose 65 - 99 mg/dL 98 045(T) 97  BUN 6 - 20 mg/dL 9 9 7   Creatinine 0.44 - 1.00 mg/dL 9.77 4.14 2.39  Sodium 135 - 145 mmol/L 135 136 141  Potassium 3.5 - 5.1 mmol/L 3.5 2.9(L) 3.1(L)  Chloride 101 - 111 mmol/L 104 93(L) 103  CO2 22 - 32 mmol/L 23 27 26   Calcium 8.9 - 10.3 mg/dL 5.3(U) 10.4(H) 9.3  Total Protein 6.5 - 8.1 g/dL 7.5 - 7.9  Total Bilirubin 0.3 - 1.2 mg/dL 0.5 - 0.6  Alkaline Phos 38 - 126 U/L 73 - 80  AST 15 - 41 U/L 20 - 18  ALT 14 - 54 U/L 10(L) - 10(L)    Imaging studies:  Limited RUQ Abdominal Ultrasound (02/11/2018) 1. 11 mm nonobstructing calculus is noted within a nondistended gallbladder. Positive sonographic Eulah Pont sign was elicited however. Previously noted tumefactive biliary sludge within the lumen gallbladder is less apparent and may  have passed. 2. 8 mm echogenic focus in the right kidney without shadowing suspicious for small angiomyolipoma or focus of fat.  Complete Abdominal Ultrasound (12/31/2017) 1. Diffusely echogenic gallbladder, likely due to combination of sludge and stones. No associated  findings of acute cholecystitis. 2. Liver appears steatotic.   Assessment/Plan:  32 y.o. female with symptomatic cholelithiasis, complicated by co-morbidities including morbid obesity (BMI >53), DM, HTN, chronic idiopathic sinus tachycardia, asthma, OSA not treated with CPAP, fibromyalgia and chronic Left lumbar radiculopathy (pain managed by pain medicine clinic), bipolar disorder, generalized anxiety disorder, and major depression disorder.              - avoid/minimize foods with higher fat content (meats, cheeses/dairy, and fried)             - prefer low-fat vegetables, whole grains (wheat bread, ceareals, etc), and fruits until cholecystectomy              - all risks, benefits, and alternatives to cholecystectomy were discussed with the patient, all of his questions were answered to his expressed satisfaction, patient expresses she wishes to proceed, and informed consent was obtained accordingly.             - will plan for laparoscopic cholecystectomy next Friday 3/29 per patient request  - discussed with patient and encouraged significant weight loss, including consideration for bariatric weight loss program and surgery (considering patient's young age), for which patient expresses interest and for which printed information and seminar schedules were provided  - though patient has underwent extensive medical workup and management for her young age, will communicate with primary care regarding risk stratification and optimization             - anticipate return to clinic 2 weeks after above planned surgery             - instructed to call if any questions or concerns  All of the above recommendations were discussed with the  patient and patient's family, and all of patient's and family's questions were answered to their expressed satisfaction.  -- Scherrie Gerlach Earlene Plater, MD, RPVI [place]: [facility] General Surgery - Partnering for exceptional care. Office: [phone]

## 2018-02-15 ENCOUNTER — Telehealth: Payer: Self-pay | Admitting: General Practice

## 2018-02-15 NOTE — Telephone Encounter (Signed)
Called patient back and had to leave her a voicemail to return my call. 

## 2018-02-15 NOTE — Telephone Encounter (Signed)
Patients calling said she had a missed call from someone is our office. Please call patient and advise.

## 2018-02-18 NOTE — Telephone Encounter (Signed)
Pt advised of pre op date/time and sx date. Sx: 02/22/18 with Dr Davis--laparoscopic cholecystectomy.  Pre op: 02/19/18 @ 11:30am--office interview.   Patient made aware to call [phone], between 1-3:00pm the day before surgery, to find out what time to arrive.

## 2018-02-19 ENCOUNTER — Ambulatory Visit
Admission: RE | Admit: 2018-02-19 | Discharge: 2018-02-19 | Disposition: A | Discharge status: Home / Self Care | Payer: BLUE CROSS/BLUE SHIELD | Source: Ambulatory Visit | Attending: Surgery | Admitting: Surgery

## 2018-02-19 ENCOUNTER — Other Ambulatory Visit: Payer: Self-pay

## 2018-02-19 DIAGNOSIS — Z01812 Encounter for preprocedural laboratory examination: Secondary | ICD-10-CM | POA: Insufficient documentation

## 2018-02-19 HISTORY — DX: Prediabetes: R73.03

## 2018-02-19 NOTE — Pre-Procedure Instructions (Signed)
Faxed and called for last note/ekg/echo/stress available from dr s Welton Flakes

## 2018-02-19 NOTE — Patient Instructions (Addendum)
Your procedure is scheduled on: Friday 02/22/18 Report to Day Surgery. To find out your arrival time please call [phone] between 1PM - 3PM on Thurs.02/21/18  Remember: Instructions that are not followed completely may result in serious medical risk, up to and including death, or upon the discretion of your surgeon and anesthesiologist your surgery may need to be rescheduled.     _X__ 1. Do not eat food after midnight the night before your procedure.                 No gum chewing or hard candies. You may drink clear liquids up to 2 hours                 before you are scheduled to arrive for your surgery- DO not drink clear                 liquids within 2 hours of the start of your surgery.                 Clear Liquids include:  water, apple juice without pulp, clear carbohydrate                 drink such as Clearfast of Gartorade, Black Coffee or Tea (Do not add                 anything to coffee or tea).  __X__2.  On the morning of surgery brush your teeth with toothpaste and water, you may rinse your mouth with mouthwash if you wish.  Do not swallow any toothpaste of mouthwash.     ___ 3.  No Alcohol for 24 hours before or after surgery.   ___ 4.  Do Not Smoke or use e-cigarettes For 24 Hours Prior to Your Surgery.                 Do not use any chewable tobacco products for at least 6 hours prior to                 surgery.  ____  5.  Bring all medications with you on the day of surgery if instructed.   __x__  6.  Notify your doctor if there is any change in your medical condition      (cold, fever, infections).     Do not wear jewelry, make-up, hairpins, clips or nail polish. Do not wear lotions, powders, or perfumes. You may wear deodorant. Do not shave 48 hours prior to surgery. Men may shave face and neck. Do not bring valuables to the hospital.    [facility] is not responsible for any belongings or valuables.  Contacts, dentures or bridgework  may not be worn into surgery. Leave your suitcase in the car. After surgery it may be brought to your room. For patients admitted to the hospital, discharge time is determined by your treatment team.   Patients discharged the day of surgery will not be allowed to drive home.   Please read over the following fact sheets that you were given:    ___x_ Take these medicines the morning of surgery with A SIP OF WATER:    1. Cariprazine HCl   2. clonazePAM (KLONOPIN) 2 MG tablet  3. Dexlansoprazole (DEXILANT) 30 MG capsule  4.diltiazem (CARDIZEM CD) 360 MG 24 hr capsule  5.fluticasone (FLONASE) 50 MCG/ACT nasal spray  6.gabapentin (NEURONTIN) 300 MG capsule             7.topiramate (TOPAMAX)  50 MG tablet             8.propranolol (INDERAL) 40 MG tablet            9 .Pain medication and muscle relaxant if needed  ____ Fleet Enema (as directed)   __x__ Use CHG Soap as directed  __x__ Use inhalers on the day of surgeryalbuterol (PROVENTIL HFA;VENTOLIN HFA) 108 (90 Base) MCG/ACT inhaler and bring with you to the hospital budesonide-formoterol (SYMBICORT) 160-4.5 MCG/ACT inhaler  ____ Stop metformin 2 days prior to surgery    ____ Take 1/2 of usual insulin dose the night before surgery. No insulin the morning          of surgery.   ____ Stop Coumadin/Plavix/aspirin on   __x__ Stop Anti-inflammatories on ibuprofen (ADVIL,MOTRIN) 600 MG tablet   ____ Stop supplements until after surgery.    ____ Bring C-Pap to the hospital.

## 2018-02-20 ENCOUNTER — Other Ambulatory Visit: Payer: Self-pay

## 2018-02-20 ENCOUNTER — Emergency Department: Payer: BLUE CROSS/BLUE SHIELD

## 2018-02-20 ENCOUNTER — Emergency Department
Admission: EM | Admit: 2018-02-20 | Discharge: 2018-02-20 | Disposition: A | Discharge status: Home / Self Care | Payer: BLUE CROSS/BLUE SHIELD | Attending: Emergency Medicine | Admitting: Emergency Medicine

## 2018-02-20 DIAGNOSIS — Z79899 Other long term (current) drug therapy: Secondary | ICD-10-CM | POA: Diagnosis not present

## 2018-02-20 DIAGNOSIS — F319 Bipolar disorder, unspecified: Secondary | ICD-10-CM | POA: Insufficient documentation

## 2018-02-20 DIAGNOSIS — R440 Auditory hallucinations: Secondary | ICD-10-CM | POA: Diagnosis not present

## 2018-02-20 DIAGNOSIS — R1011 Right upper quadrant pain: Secondary | ICD-10-CM | POA: Diagnosis present

## 2018-02-20 DIAGNOSIS — I10 Essential (primary) hypertension: Secondary | ICD-10-CM | POA: Insufficient documentation

## 2018-02-20 DIAGNOSIS — J45909 Unspecified asthma, uncomplicated: Secondary | ICD-10-CM | POA: Insufficient documentation

## 2018-02-20 DIAGNOSIS — Z3202 Encounter for pregnancy test, result negative: Secondary | ICD-10-CM | POA: Insufficient documentation

## 2018-02-20 LAB — URINALYSIS, COMPLETE (UACMP) WITH MICROSCOPIC
Bacteria, UA: NONE SEEN
Bilirubin Urine: NEGATIVE
Glucose, UA: NEGATIVE mg/dL
Hgb urine dipstick: NEGATIVE
Ketones, ur: NEGATIVE mg/dL
Leukocytes, UA: NEGATIVE
Nitrite: NEGATIVE
Protein, ur: NEGATIVE mg/dL
RBC / HPF: NONE SEEN RBC/hpf (ref 0–5)
Specific Gravity, Urine: 1.013 (ref 1.005–1.030)
pH: 7 (ref 5.0–8.0)

## 2018-02-20 LAB — COMPREHENSIVE METABOLIC PANEL
ALT: 13 U/L — ABNORMAL LOW (ref 14–54)
AST: 20 U/L (ref 15–41)
Albumin: 4.2 g/dL (ref 3.5–5.0)
Alkaline Phosphatase: 74 U/L (ref 38–126)
Anion gap: 11 (ref 5–15)
BUN: 11 mg/dL (ref 6–20)
CO2: 23 mmol/L (ref 22–32)
Calcium: 9.3 mg/dL (ref 8.9–10.3)
Chloride: 104 mmol/L (ref 101–111)
Creatinine, Ser: 0.71 mg/dL (ref 0.44–1.00)
GFR calc Af Amer: >60 mL/min (ref >60)
GFR calc non Af Amer: >60 mL/min (ref >60)
Glucose, Bld: 98 mg/dL (ref 65–99)
Potassium: 4.1 mmol/L (ref 3.5–5.1)
Sodium: 138 mmol/L (ref 135–145)
Total Bilirubin: 0.7 mg/dL (ref 0.3–1.2)
Total Protein: 8.2 g/dL — ABNORMAL HIGH (ref 6.5–8.1)

## 2018-02-20 LAB — URINE DRUG SCREEN, QUALITATIVE (ARMC ONLY)
Amphetamines, Ur Screen: POSITIVE — AB
Barbiturates, Ur Screen: POSITIVE — AB
Benzodiazepine, Ur Scrn: NOT DETECTED
Cannabinoid 50 Ng, Ur ~~LOC~~: NOT DETECTED
Cocaine Metabolite,Ur ~~LOC~~: NOT DETECTED
MDMA (Ecstasy)Ur Screen: NOT DETECTED
Methadone Scn, Ur: NOT DETECTED
Opiate, Ur Screen: NOT DETECTED
Phencyclidine (PCP) Ur S: NOT DETECTED
Tricyclic, Ur Screen: NOT DETECTED

## 2018-02-20 LAB — CBC
HCT: 42 % (ref 35.0–47.0)
Hemoglobin: 13.5 g/dL (ref 12.0–16.0)
MCH: 26.7 pg (ref 26.0–34.0)
MCHC: 32.1 g/dL (ref 32.0–36.0)
MCV: 83.2 fL (ref 80.0–100.0)
Platelets: 270 10*3/uL (ref 150–440)
RBC: 5.05 MIL/uL (ref 3.80–5.20)
RDW: 15.3 % — ABNORMAL HIGH (ref 11.5–14.5)
WBC: 6.4 10*3/uL (ref 3.6–11.0)

## 2018-02-20 LAB — ETHANOL: Alcohol, Ethyl (B): <10 mg/dL (ref <10)

## 2018-02-20 LAB — ACETAMINOPHEN LEVEL: Acetaminophen (Tylenol), Serum: <10 ug/mL — ABNORMAL LOW (ref 10–30)

## 2018-02-20 LAB — SALICYLATE LEVEL: Salicylate Lvl: <7 mg/dL (ref 2.8–30.0)

## 2018-02-20 LAB — POCT PREGNANCY, URINE: Preg Test, Ur: NEGATIVE

## 2018-02-20 LAB — LIPASE, BLOOD: Lipase: 25 U/L (ref 11–51)

## 2018-02-20 MED ORDER — PROMETHAZINE HCL 25 MG PO TABS
25.0000 mg | ORAL_TABLET | Freq: Once | ORAL | Status: AC
  Administered 2018-02-20: 25 mg via ORAL
  Filled 2018-02-20: qty 1

## 2018-02-20 MED ORDER — OXYCODONE-ACETAMINOPHEN 5-325 MG PO TABS
1.0000 | ORAL_TABLET | Freq: Once | ORAL | Status: AC
  Administered 2018-02-20: 1 via ORAL
  Filled 2018-02-20: qty 1

## 2018-02-20 MED ORDER — HYDROCODONE-ACETAMINOPHEN 5-325 MG PO TABS: 1.0000 | ORAL_TABLET | Freq: Four times a day (QID) | ORAL | 0 refills | Status: DC | PRN

## 2018-02-20 MED ORDER — PROMETHAZINE HCL 12.5 MG PO TABS: 25.0000 mg | ORAL_TABLET | Freq: Four times a day (QID) | ORAL | 0 refills | Status: DC | PRN

## 2018-02-20 MED ORDER — ONDANSETRON 4 MG PO TBDP
4.0000 mg | ORAL_TABLET | Freq: Once | ORAL | Status: AC
  Administered 2018-02-20: 4 mg via ORAL
  Filled 2018-02-20: qty 1

## 2018-02-20 NOTE — ED Notes (Signed)
This RN attempted x 2 to draw blood without success.

## 2018-02-20 NOTE — ED Triage Notes (Signed)
Pt complaining of RUQ pain, states that she is nauseated and has a headache. She also reports that she is having racing thoughts and wants to talk to the Behavioral health nurse while she is here also.

## 2018-02-20 NOTE — ED Notes (Signed)
Pt states "still a little coming up, not a lot but a little bit". No noted active vomiting. Will continue to monitor for further patient needs.

## 2018-02-20 NOTE — ED Notes (Signed)
Pt given meal tray and ginger ale at this time 

## 2018-02-20 NOTE — ED Notes (Signed)
NAD noted at time of D/C. Pt denies questions or concerns. Pt ambulatory to the lobby at this time. Pt D/C with her mom.

## 2018-02-20 NOTE — ED Notes (Addendum)
SOC set up at bedside. Pt's sister found to be in room, this RN apologized but asked patient's sister to leave, explaining due to patient safety, psychiatric rules, visiting and phone hours. Pt's sister states understanding.

## 2018-02-20 NOTE — Discharge Instructions (Addendum)
The psychiatrist who evaluated who believes that you are taking too many psychiatric medications and she recommended the following changes:  - STOP Vyvanse - STOP Saphris -Decrease Lexapro to 10 mg orally once a day -Continue Klonopin 2 mg daily at bedtime -Continue Vryalar 6 mg daily -Neurontin 300 mg 3 times a day -Continue Risperdal 1 mg at bedtime -Follow-up with your psychiatrist in 1-2 weeks  You have been seen in the Emergency Department (ED)  today for a psychiatric complaint.  You have been evaluated by psychiatry and we believe you are safe to be discharged from the hospital.    Please return to the Emergency Department (ED)  immediately if you have ANY thoughts of hurting yourself or anyone else, so that we may help you.  Please avoid alcohol and drug use.  Follow up with your doctor and/or therapist as soon as possible regarding today's ED  visit.   You may call crisis hotline for [facility] at [phone].

## 2018-02-20 NOTE — ED Provider Notes (Signed)
[facility] Emergency Department Provider Note ____________________________________________   I have reviewed the triage vital signs and the triage nursing note.  HISTORY  Chief Complaint Abdominal Pain and Manic Behavior   Historian Patient  HPI Andrea Miller is a 32 y.o. female with a history of anxiety and bipolar, as well as a history of pseudoseizures, obesity, depression, neuralgia, hypertension, presents to the ED for 2 reasons.  First of all she states that she has been diagnosed with gallstones and has run out of Phenergan and has been feeling nauseated at home.  She also has uncontrolled pain her right side.  States that she had previously taken oxycodone.  She did preop yesterday, for surgery scheduled on this Friday.  Patient states that because of the pain and the nausea she feels like she has not been sleeping very well and she is been having racing thoughts and feels like she is hearing voices more so than her baseline.  She states when she called her psychiatry office/therapist today they told her to come to the ED for further evaluation.  Pain is moderate.  Exacerbation of hearing voices is moderate.   Past Medical History:  Diagnosis Date  . Anxiety   . Asthma   . Bipolar disorder (HCC)   . Chest pain    and angina  . Depression   . Fibromyalgia   . Hypertension   . 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 Obesity   . Pre-diabetes   . Seizures (HCC)    secondary to anxiety  last seizure 01/25/18  . SVT (supraventricular tachycardia) (HCC)    with hx of syncope; managed by [facility]    Patient Active Problem List   Diagnosis Date Noted  . Edema 02/13/2018  . Diabetes mellitus type 2, uncomplicated (HCC) 02/13/2018  . Hypertension 11/23/2017  . Asthma exacerbation 04/14/2017  . Asthma without status asthmaticus 01/16/2017  . Bipolar affective (HCC) 01/16/2017  . Coronary artery disease  01/16/2017  . Syncope 12/12/2016  . Fibromyalgia 04/27/2014  . OSA (obstructive sleep apnea) 04/27/2014  . Seizure-like activity (HCC) 04/27/2014  . Migraine without status migrainosus, not intractable 04/27/2014  . Obesity 04/15/2014  . Sleep disorder 04/15/2014  . Neck pain 04/15/2014  . Headache 04/15/2014  . Restless legs syndrome 04/15/2014  . Supraventricular tachycardia (HCC) 07/13/2009    Past Surgical History:  Procedure Laterality Date  . TONSILLECTOMY AND ADENOIDECTOMY N/A 09/05/2016   Procedure: TONSILLECTOMY AND ADENOIDECTOMY;  Surgeon: Linus Salmons, MD;  Location: [facility] ORS;  Service: ENT;  Laterality: N/A;    Prior to Admission medications   Medication Sig Start Date End Date Taking? Authorizing Provider  albuterol (PROVENTIL HFA;VENTOLIN HFA) 108 (90 Base) MCG/ACT inhaler Inhale 2 puffs into the lungs every 6 (six) hours as needed for wheezing or shortness of breath. 01/31/18   Merwyn Katos, MD  albuterol (PROVENTIL) (2.5 MG/3ML) 0.083% nebulizer solution Take 2.5 mg by nebulization every 4 (four) hours as needed for wheezing or shortness of breath.    [provider]  budesonide-formoterol (SYMBICORT) 160-4.5 MCG/ACT inhaler Inhale 2 puffs into the lungs 2 (two) times daily. 01/31/18   Merwyn Katos, MD  Cariprazine HCl (VRAYLAR) 6 MG CAPS Take 6 mg by mouth daily.    [provider]  clonazePAM (KLONOPIN) 2 MG tablet Take 2-4 mg by mouth See admin instructions. Take 2 mgs in the morning and 4 mg at bedtime    [provider]  Dexlansoprazole (DEXILANT) 30 MG capsule Take 30 mg by mouth daily.    [provider]  diltiazem (CARDIZEM CD) 360 MG 24 hr capsule Take 360 mg by mouth daily.     [provider]  docusate sodium (COLACE) 100 MG capsule Take 100 mg by mouth 2 (two) times daily.    [provider]  escitalopram (LEXAPRO) 20 MG tablet Take 20 mg by mouth daily.    [provider]  fluticasone  (FLONASE) 50 MCG/ACT nasal spray Place 2 sprays into both nostrils daily.    [provider]  folic acid (FOLVITE) 1 MG tablet Take 1 mg by mouth daily.  12/15/16   [provider]  furosemide (LASIX) 40 MG tablet Take 40 mg by mouth daily as needed. Feet swelling    [provider]  gabapentin (NEURONTIN) 300 MG capsule Take 300 mg by mouth 3 (three) times daily.    [provider]  HYDROcodone-acetaminophen (NORCO/VICODIN) 5-325 MG tablet Take 1 tablet by mouth every 6 (six) hours as needed for moderate pain. 02/20/18   Governor Rooks, MD  ibuprofen (ADVIL,MOTRIN) 600 MG tablet Take 1 tablet (600 mg total) by mouth every 6 (six) hours as needed. Patient taking differently: Take 600 mg by mouth every 6 (six) hours as needed for moderate pain.  12/22/16   Joni Reining, PA-C  linaclotide (LINZESS) 290 MCG CAPS capsule Take 290 mcg by mouth daily before breakfast.    [provider]  montelukast (SINGULAIR) 10 MG tablet Take 1 tablet (10 mg total) by mouth at bedtime. 01/31/18 01/31/19  Merwyn Katos, MD  oxyCODONE-acetaminophen (PERCOCET) 5-325 MG tablet Take 1 tablet by mouth every 4 (four) hours as needed for moderate pain or severe pain. 02/11/18   Schaevitz, Myra Rude, MD  potassium chloride (K-DUR) 10 MEQ tablet Take 10 meq once daily then the next day take 10 meq twice daily    [provider]  promethazine (PHENERGAN) 12.5 MG tablet Take 2 tablets (25 mg total) by mouth every 6 (six) hours as needed for nausea or vomiting. 02/20/18   Governor Rooks, MD  propranolol (INDERAL) 40 MG tablet Take 40 mg by mouth 3 (three) times daily.    [provider]  risperiDONE (RISPERDAL) 1 MG tablet Take 1 mg by mouth daily.    [provider]  rosuvastatin (CRESTOR) 20 MG tablet Take 20 mg by mouth daily.    [provider]  tiZANidine (ZANAFLEX) 4 MG tablet Take 4 mg by mouth 3 (three) times daily.  03/26/17   [provider]  topiramate (TOPAMAX) 50 MG tablet Take 100 mg by mouth 2 (two) times daily.    [provider]    Allergies  Allergen Reactions  . Cortizone-10 [Hydrocortisone] Shortness Of Breath and Swelling  . Garlic Anaphylaxis  . Corticosteroids Palpitations    Family History  Problem Relation Age of Onset  . Diabetes Mother   . Hypertension Mother   . Asthma Mother   . Gallstones Mother   . Diabetes Father   . Hypertension Father   . Gallstones Father     Social History Social History   Tobacco Use  . Smoking status: Never Smoker  . Smokeless tobacco: Never Used  Substance Use Topics  . Alcohol use: No  . Drug use: No    Review of Systems  Constitutional: Negative for fever. Eyes: Negative for visual changes. ENT: Negative for sore throat. Cardiovascular: Negative for  chest pain. Respiratory: Negative for shortness of breath. Gastrointestinal: Negative for diarrhea. Genitourinary: Negative for dysuria. Musculoskeletal: Negative for back pain. Skin: Negative for rash. Neurological: Negative for headache.  ____________________________________________   PHYSICAL EXAM:  VITAL SIGNS: ED Triage Vitals  Enc Vitals Group     BP 02/20/18 1002 (!) 147/96     Pulse Rate 02/20/18 1002 93     Resp 02/20/18 1002 18     Temp 02/20/18 1002 99.1 F (37.3 C)     Temp Source 02/20/18 1002 Oral     SpO2 02/20/18 1002 93 %     Weight 02/20/18 1003 (!) 371 lb (168.3 kg)     Height 02/20/18 1003 5\' 10"  (1.778 m)     Head Circumference --      Peak Flow --      Pain Score 02/20/18 1003 5     Pain Loc --      Pain Edu? --      Excl. in GC? --      Constitutional: Alert and oriented. Well appearing and in no distress. HEENT   Head: Normocephalic and atraumatic.      Eyes: Conjunctivae are normal. Pupils equal and round.       Ears:         Nose: No congestion/rhinnorhea.   Mouth/Throat: Mucous membranes are moist.   Neck: No  stridor. Cardiovascular/Chest: Normal rate, regular rhythm.  No murmurs, rubs, or gallops. Respiratory: Normal respiratory effort without tachypnea nor retractions. Breath sounds are clear and equal bilaterally. No wheezes/rales/rhonchi. Gastrointestinal: Soft. No distention, no guarding, no rebound.  Mild tenderness in the epigastrium/right upper quadrant.  Morbidly obese. Genitourinary/rectal:Deferred Musculoskeletal: Nontender with normal range of motion in all extremities. No joint effusions.  No lower extremity tenderness.  No edema. Neurologic:  Normal speech and language. No gross or focal neurologic deficits are appreciated. Skin:  Skin is warm, dry and intact. No rash noted. Psychiatric: Reports racing thoughts and increase in hearing voices.  Denies suicidal ideation.   ____________________________________________  LABS (pertinent positives/negatives) I, Governor Rooks, MD the attending physician have reviewed the labs noted below.  Labs Reviewed  COMPREHENSIVE METABOLIC PANEL - Abnormal; Notable for the following components:      Result Value   Total Protein 8.2 (*)    ALT 13 (*)    All other components within normal limits  CBC - Abnormal; Notable for the following components:   RDW 15.3 (*)    All other components within normal limits  URINALYSIS, COMPLETE (UACMP) WITH MICROSCOPIC - Abnormal; Notable for the following components:   Color, Urine YELLOW (*)    APPearance CLEAR (*)    Squamous Epithelial / LPF 0-5 (*)    All other components within normal limits  ACETAMINOPHEN LEVEL - Abnormal; Notable for the following components:   Acetaminophen (Tylenol), Serum <10 (*)    All other components within normal limits  URINE DRUG SCREEN, QUALITATIVE ([facility] ONLY) - Abnormal; Notable for the following components:   Amphetamines, Ur Screen POSITIVE (*)    Barbiturates, Ur Screen POSITIVE (*)    All other components within normal limits  LIPASE, BLOOD  ETHANOL  SALICYLATE  LEVEL  POC URINE PREG, ED  POCT PREGNANCY, URINE    Imaging Right upper quadrant ultrasound radiologist interpretation: IMPRESSION: Contracted gallbladder filled with stones and sludge. No positive sonographic Murphy's sign.  Normal appearing common bile duct.  Increased hepatic echotexture likely reflects fatty infiltrative change. __________________________________________  PROCEDURES  Procedure(s) performed: None  Procedures  Critical Care performed: None   ____________________________________________  ED COURSE / ASSESSMENT AND PLAN  Pertinent labs & imaging results that were available during my care of the patient were reviewed by me and considered in my medical decision making (see chart for details).   It sounds like the main reason patient came over today was actually that she was hearing voices more so than at baseline and when she called her therapist office they told her to come the emergency department.  She also reports that she is been having uncontrolled right upper quadrant abdominal pain because she is currently out of pain and nausea medications and has surgery scheduled for just 2 days now, on Friday.  It is unclear to me, but it sounds like she probably would not of come to the ER specifically because of the abdominal pain, although she states that she thinks she is dehydrated and might need an IV.  On clinical exam she does not look clinically dehydrated with moist mucous membranes.  She is extremely hard IV stick.  We will start with her symptomatic control with p.o. Phenergan and Percocet.  She does seem very cooperative and voluntary for psychiatric evaluation.  It sounds like this is a slight exacerbation of her baseline, and she is quite worried that anything would derail her from actually getting her surgery on Friday.  I discussed with her having her consult with the tele-psychiatrist via video conference today.  Patient is a hard IV  stick, awaiting IV team.  Ultrasound gallbladder does show gallstones and sludge without additional signs of biliary obstruction or cholecystitis.  Laboratory studies are all reassuring.  Patient awaiting specialist on call evaluation for hallucinations.  Patient care transferred to Dr. Don Perking at shift change.  Pending SOC psych eval.  I reviewed the [facility] drug database, patient does have multiple medications sedative and hypnotics on there, however she has completed the most recent short course of pain medication for her gallbladder pain.  I would give her just 6 tablets to hopefully help with symptom control until her surgery appointment in 2 days.   CONSULTATIONS:   TTS and tele-psych. Dr. Aleen Campi - surgery -discussed the reassuring laboratory studies and patient's exam and evaluation.  Will discharge with symptomatic medication for the next just over 24 hours that she can have her surgery on Friday.   Patient / Family / Caregiver informed of clinical course, medical decision-making process, and agree with plan.    ___________________________________________   FINAL CLINICAL IMPRESSION(S) / ED DIAGNOSES   Final diagnoses:  Hearing voices  RUQ abdominal pain  RUQ pain      ___________________________________________        Note: This dictation was prepared with Dragon dictation. Any transcriptional errors that result from this process are unintentional    Governor Rooks, MD 02/20/18 1456

## 2018-02-20 NOTE — ED Notes (Signed)
Spoke with charge RN, Judeth Cornfield about patient and sx. Plan to move patient to room 24 when clean. Will have staff stay within arms reach of patient until move. Denies active thoughts of SI, or plan

## 2018-02-20 NOTE — ED Notes (Signed)
Lab at bedside at this time to collect labs.

## 2018-02-20 NOTE — Telephone Encounter (Signed)
Medical clearance has been placed in patients chart, also printed copy for you in your folder up front.

## 2018-02-20 NOTE — ED Notes (Addendum)
Pt c/o right mid and RUQ pain, states gallbladder to be removed Friday of this week. Has gallstone, same pain patient states. Also reporting feeling paranoid, hearing voices telling her to hurt herself X 1 week. Has been in touch with psychiatrist. Does not want to hurt or kill herself, denies plans or intent. "I usually just go to sleep when I hear these". Denies HI. "I have been having problems with my anger"

## 2018-02-20 NOTE — ED Notes (Signed)
Report given to SOC doctor.  

## 2018-02-20 NOTE — ED Notes (Signed)
Attempt for blood collection X 1 by this RN and by Lafonda Mosses, EDTech X 1 unsuccessful.

## 2018-02-20 NOTE — ED Notes (Signed)
Sitting with pt in triage 3 at this time.
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
## 2018-02-20 NOTE — ED Provider Notes (Signed)
-----------------------------------------   3:57 PM on 02/20/2018 -----------------------------------------   Blood pressure 116/67, pulse 73, temperature 99.1 F (37.3 C), temperature source Oral, resp. rate 18, height 5\' 10"  (1.778 m), weight (!) 168.3 kg (371 lb), last menstrual period 01/23/2018, SpO2 100 %.  Assuming care from Dr. Shaune Pollack of [facility] CANGEMI is a 32 y.o. female with a chief complaint of Abdominal Pain and Manic Behavior .    Please refer to H&P by previous MD for further details.  The current plan of care is to await psychiatric evaluation.  Patient was seen by Dr Ma Hillock, psychiatry is who recommended discharge home.  She made several recommendations including:  - STOP Vyvanse - STOP Saphris -Decrease Lexapro to 10 mg orally once a day -Continue Klonopin 2 mg daily at bedtime -Continue Vryalar 6 mg daily -Neurontin 300 mg 3 times a day -Continue Risperdal 1 mg at bedtime -Follow-up with your psychiatrist in 1-2 weeks  All of these recs were discussed with patient. Patient was medically cleared by Dr. Shaune Pollack and now stable for dc home. Will dc with prescriptions left by Dr. Dellie Burns, [place], [place] 02/20/18 [phone]

## 2018-02-21 MED ORDER — CIPROFLOXACIN IN D5W 400 MG/200ML IV SOLN
400.0000 mg | INTRAVENOUS | Status: AC
  Administered 2018-02-22: 400 mg via INTRAVENOUS

## 2018-02-21 NOTE — Telephone Encounter (Signed)
Patient is ready to have surgery tomorrow.

## 2018-02-22 ENCOUNTER — Encounter
Admission: RE | Disposition: A | Discharge status: Home / Self Care | Payer: Self-pay | Source: Ambulatory Visit | Attending: Surgery

## 2018-02-22 ENCOUNTER — Other Ambulatory Visit: Payer: Self-pay

## 2018-02-22 ENCOUNTER — Ambulatory Visit: Payer: BLUE CROSS/BLUE SHIELD | Admitting: Anesthesiology

## 2018-02-22 ENCOUNTER — Ambulatory Visit
Admission: RE | Admit: 2018-02-22 | Discharge: 2018-02-22 | Disposition: A | Discharge status: Home / Self Care | Payer: BLUE CROSS/BLUE SHIELD | Source: Ambulatory Visit | Attending: Surgery | Admitting: Surgery

## 2018-02-22 ENCOUNTER — Encounter: Payer: Self-pay | Admitting: *Deleted

## 2018-02-22 DIAGNOSIS — I471 Supraventricular tachycardia: Secondary | ICD-10-CM | POA: Diagnosis not present

## 2018-02-22 DIAGNOSIS — J45909 Unspecified asthma, uncomplicated: Secondary | ICD-10-CM | POA: Insufficient documentation

## 2018-02-22 DIAGNOSIS — M5416 Radiculopathy, lumbar region: Secondary | ICD-10-CM | POA: Diagnosis not present

## 2018-02-22 DIAGNOSIS — Z79899 Other long term (current) drug therapy: Secondary | ICD-10-CM | POA: Diagnosis not present

## 2018-02-22 DIAGNOSIS — Z6841 Body Mass Index (BMI) 40.0 and over, adult: Secondary | ICD-10-CM | POA: Diagnosis not present

## 2018-02-22 DIAGNOSIS — Z791 Long term (current) use of non-steroidal anti-inflammatories (NSAID): Secondary | ICD-10-CM | POA: Diagnosis not present

## 2018-02-22 DIAGNOSIS — R569 Unspecified convulsions: Secondary | ICD-10-CM | POA: Diagnosis not present

## 2018-02-22 DIAGNOSIS — M797 Fibromyalgia: Secondary | ICD-10-CM | POA: Insufficient documentation

## 2018-02-22 DIAGNOSIS — E119 Type 2 diabetes mellitus without complications: Secondary | ICD-10-CM | POA: Insufficient documentation

## 2018-02-22 DIAGNOSIS — K811 Chronic cholecystitis: Secondary | ICD-10-CM

## 2018-02-22 DIAGNOSIS — F411 Generalized anxiety disorder: Secondary | ICD-10-CM | POA: Insufficient documentation

## 2018-02-22 DIAGNOSIS — Z79891 Long term (current) use of opiate analgesic: Secondary | ICD-10-CM | POA: Diagnosis not present

## 2018-02-22 DIAGNOSIS — Z7951 Long term (current) use of inhaled steroids: Secondary | ICD-10-CM | POA: Diagnosis not present

## 2018-02-22 DIAGNOSIS — I10 Essential (primary) hypertension: Secondary | ICD-10-CM | POA: Insufficient documentation

## 2018-02-22 DIAGNOSIS — K8010 Calculus of gallbladder with chronic cholecystitis without obstruction: Secondary | ICD-10-CM | POA: Diagnosis not present

## 2018-02-22 DIAGNOSIS — F319 Bipolar disorder, unspecified: Secondary | ICD-10-CM | POA: Diagnosis not present

## 2018-02-22 DIAGNOSIS — K8020 Calculus of gallbladder without cholecystitis without obstruction: Secondary | ICD-10-CM | POA: Diagnosis present

## 2018-02-22 DIAGNOSIS — G4733 Obstructive sleep apnea (adult) (pediatric): Secondary | ICD-10-CM | POA: Insufficient documentation

## 2018-02-22 HISTORY — PX: CHOLECYSTECTOMY: SHX55

## 2018-02-22 LAB — POCT PREGNANCY, URINE: Preg Test, Ur: NEGATIVE

## 2018-02-22 SURGERY — LAPAROSCOPIC CHOLECYSTECTOMY
Anesthesia: General | Wound class: Clean Contaminated

## 2018-02-22 MED ORDER — ROCURONIUM BROMIDE 50 MG/5ML IV SOLN
INTRAVENOUS | Status: AC
  Filled 2018-02-22: qty 1

## 2018-02-22 MED ORDER — FENTANYL CITRATE (PF) 100 MCG/2ML IJ SOLN
25.0000 ug | INTRAMUSCULAR | Status: DC | PRN
  Administered 2018-02-22 (×4): 50 ug via INTRAVENOUS

## 2018-02-22 MED ORDER — CHLORHEXIDINE GLUCONATE CLOTH 2 % EX PADS: 6.0000 | MEDICATED_PAD | Freq: Once | CUTANEOUS | Status: DC

## 2018-02-22 MED ORDER — LIDOCAINE HCL 1 % IJ SOLN
INTRAMUSCULAR | Status: DC | PRN
  Administered 2018-02-22: 30 mL via SUBCUTANEOUS

## 2018-02-22 MED ORDER — KETOROLAC TROMETHAMINE 30 MG/ML IJ SOLN
INTRAMUSCULAR | Status: DC | PRN
  Administered 2018-02-22: 30 mg via INTRAVENOUS

## 2018-02-22 MED ORDER — KETOROLAC TROMETHAMINE 30 MG/ML IJ SOLN
INTRAMUSCULAR | Status: AC
Start: 2018-02-22 — End: ?
  Filled 2018-02-22: qty 1

## 2018-02-22 MED ORDER — DEXAMETHASONE SODIUM PHOSPHATE 10 MG/ML IJ SOLN
INTRAMUSCULAR | Status: AC
  Filled 2018-02-22: qty 1

## 2018-02-22 MED ORDER — ACETAMINOPHEN 500 MG PO TABS
1000.0000 mg | ORAL_TABLET | ORAL | Status: DC
  Administered 2018-02-22: 1000 mg via ORAL

## 2018-02-22 MED ORDER — OXYCODONE-ACETAMINOPHEN 5-325 MG PO TABS: 1.0000 | ORAL_TABLET | ORAL | 0 refills | Status: DC | PRN

## 2018-02-22 MED ORDER — SUGAMMADEX SODIUM 500 MG/5ML IV SOLN
INTRAVENOUS | Status: DC | PRN
Start: 2018-02-22 — End: 2018-02-22
  Administered 2018-02-22: 400 mg via INTRAVENOUS

## 2018-02-22 MED ORDER — CIPROFLOXACIN IN D5W 400 MG/200ML IV SOLN
INTRAVENOUS | Status: AC
  Filled 2018-02-22: qty 200

## 2018-02-22 MED ORDER — ACETAMINOPHEN 500 MG PO TABS: 1000.0000 mg | ORAL_TABLET | ORAL | Status: DC

## 2018-02-22 MED ORDER — DEXAMETHASONE SODIUM PHOSPHATE 10 MG/ML IJ SOLN
INTRAMUSCULAR | Status: DC | PRN
  Administered 2018-02-22: 10 mg via INTRAVENOUS

## 2018-02-22 MED ORDER — MIDAZOLAM HCL 2 MG/2ML IJ SOLN
INTRAMUSCULAR | Status: AC
  Filled 2018-02-22: qty 2

## 2018-02-22 MED ORDER — LIDOCAINE HCL (CARDIAC) 20 MG/ML IV SOLN
INTRAVENOUS | Status: DC | PRN
  Administered 2018-02-22: 100 mg via INTRAVENOUS

## 2018-02-22 MED ORDER — LACTATED RINGERS IV SOLN
INTRAVENOUS | Status: DC
  Administered 2018-02-22 (×2): via INTRAVENOUS

## 2018-02-22 MED ORDER — LIDOCAINE HCL (PF) 2 % IJ SOLN
INTRAMUSCULAR | Status: AC
  Filled 2018-02-22: qty 10

## 2018-02-22 MED ORDER — GABAPENTIN 300 MG PO CAPS
300.0000 mg | ORAL_CAPSULE | ORAL | Status: DC
  Administered 2018-02-22: 300 mg via ORAL

## 2018-02-22 MED ORDER — PROPOFOL 10 MG/ML IV BOLUS
INTRAVENOUS | Status: AC
  Filled 2018-02-22: qty 20

## 2018-02-22 MED ORDER — CELECOXIB 200 MG PO CAPS: 200.0000 mg | ORAL_CAPSULE | ORAL | Status: DC

## 2018-02-22 MED ORDER — GLYCOPYRROLATE 0.2 MG/ML IJ SOLN
INTRAMUSCULAR | Status: AC
  Filled 2018-02-22: qty 1

## 2018-02-22 MED ORDER — ROCURONIUM BROMIDE 100 MG/10ML IV SOLN
INTRAVENOUS | Status: DC | PRN
  Administered 2018-02-22: 20 mg via INTRAVENOUS
  Administered 2018-02-22 (×2): 50 mg via INTRAVENOUS

## 2018-02-22 MED ORDER — GLYCOPYRROLATE 0.2 MG/ML IJ SOLN
INTRAMUSCULAR | Status: DC | PRN
  Administered 2018-02-22: 0.2 mg via INTRAVENOUS

## 2018-02-22 MED ORDER — FENTANYL CITRATE (PF) 100 MCG/2ML IJ SOLN
INTRAMUSCULAR | Status: AC
  Administered 2018-02-22: 50 ug via INTRAVENOUS
  Filled 2018-02-22: qty 2

## 2018-02-22 MED ORDER — PROPOFOL 10 MG/ML IV BOLUS
INTRAVENOUS | Status: DC | PRN
  Administered 2018-02-22: 200 mg via INTRAVENOUS

## 2018-02-22 MED ORDER — ONDANSETRON HCL 4 MG/2ML IJ SOLN
INTRAMUSCULAR | Status: DC | PRN
  Administered 2018-02-22: 4 mg via INTRAVENOUS

## 2018-02-22 MED ORDER — PHENYLEPHRINE HCL 10 MG/ML IJ SOLN
INTRAMUSCULAR | Status: DC | PRN
  Administered 2018-02-22: 200 ug via INTRAVENOUS
  Administered 2018-02-22 (×2): 100 ug via INTRAVENOUS
  Administered 2018-02-22 (×2): 200 ug via INTRAVENOUS

## 2018-02-22 MED ORDER — CIPROFLOXACIN IN D5W 400 MG/200ML IV SOLN: 400.0000 mg | INTRAVENOUS | Status: DC

## 2018-02-22 MED ORDER — OXYCODONE-ACETAMINOPHEN 5-325 MG PO TABS
ORAL_TABLET | ORAL | Status: AC
  Filled 2018-02-22: qty 2

## 2018-02-22 MED ORDER — OXYCODONE-ACETAMINOPHEN 5-325 MG PO TABS
1.0000 | ORAL_TABLET | ORAL | Status: DC | PRN
  Administered 2018-02-22: 1 via ORAL

## 2018-02-22 MED ORDER — GABAPENTIN 300 MG PO CAPS: 300.0000 mg | ORAL_CAPSULE | ORAL | Status: DC

## 2018-02-22 MED ORDER — MIDAZOLAM HCL 2 MG/2ML IJ SOLN
INTRAMUSCULAR | Status: DC | PRN
  Administered 2018-02-22: 2 mg via INTRAVENOUS

## 2018-02-22 MED ORDER — ACETAMINOPHEN 500 MG PO TABS
ORAL_TABLET | ORAL | Status: AC
  Filled 2018-02-22: qty 2

## 2018-02-22 MED ORDER — LIDOCAINE HCL (PF) 1 % IJ SOLN
INTRAMUSCULAR | Status: AC
  Filled 2018-02-22: qty 30

## 2018-02-22 MED ORDER — GABAPENTIN 300 MG PO CAPS
ORAL_CAPSULE | ORAL | Status: AC
  Filled 2018-02-22: qty 1

## 2018-02-22 MED ORDER — PHENYLEPHRINE HCL 10 MG/ML IJ SOLN
INTRAMUSCULAR | Status: AC
  Filled 2018-02-22: qty 1

## 2018-02-22 MED ORDER — FENTANYL CITRATE (PF) 100 MCG/2ML IJ SOLN
INTRAMUSCULAR | Status: DC | PRN
  Administered 2018-02-22: 100 ug via INTRAVENOUS

## 2018-02-22 MED ORDER — FENTANYL CITRATE (PF) 100 MCG/2ML IJ SOLN
INTRAMUSCULAR | Status: AC
  Filled 2018-02-22: qty 2

## 2018-02-22 MED ORDER — PROMETHAZINE HCL 25 MG/ML IJ SOLN: 6.2500 mg | INTRAMUSCULAR | Status: DC | PRN

## 2018-02-22 MED ORDER — EPHEDRINE SULFATE 50 MG/ML IJ SOLN
INTRAMUSCULAR | Status: AC
  Filled 2018-02-22: qty 1

## 2018-02-22 MED ORDER — BUPIVACAINE HCL (PF) 0.5 % IJ SOLN
INTRAMUSCULAR | Status: AC
  Filled 2018-02-22: qty 30

## 2018-02-22 MED ORDER — ONDANSETRON HCL 4 MG/2ML IJ SOLN
INTRAMUSCULAR | Status: AC
  Filled 2018-02-22: qty 2

## 2018-02-22 MED ORDER — HYDROMORPHONE HCL 1 MG/ML IJ SOLN
INTRAMUSCULAR | Status: DC
Start: 2018-02-22 — End: 2018-02-22
  Filled 2018-02-22: qty 1

## 2018-02-22 SURGICAL SUPPLY — 37 items
APPLICATOR ARISTA FLEXITIP XL (MISCELLANEOUS) ×2 IMPLANT
APPLIER CLIP ROT 10 11.4 M/L (STAPLE) ×2
CHLORAPREP W/TINT 26ML (MISCELLANEOUS) ×2 IMPLANT
CLIP APPLIE ROT 10 11.4 M/L (STAPLE) ×1 IMPLANT
DECANTER SPIKE VIAL GLASS SM (MISCELLANEOUS) ×4 IMPLANT
DERMABOND ADVANCED (GAUZE/BANDAGES/DRESSINGS) ×1
DERMABOND ADVANCED .7 DNX12 (GAUZE/BANDAGES/DRESSINGS) ×1 IMPLANT
DRESSING SURGICEL FIBRLLR 1X2 (HEMOSTASIS) IMPLANT
DRSG SURGICEL FIBRILLAR 1X2 (HEMOSTASIS)
ELECT REM PT RETURN 9FT ADLT (ELECTROSURGICAL) ×2
ELECTRODE REM PT RTRN 9FT ADLT (ELECTROSURGICAL) ×1 IMPLANT
GLOVE BIO SURGEON STRL SZ7 (GLOVE) ×10 IMPLANT
GLOVE BIOGEL PI IND STRL 7.5 (GLOVE) ×1 IMPLANT
GLOVE BIOGEL PI INDICATOR 7.5 (GLOVE) ×1
GOWN STRL REUS W/ TWL LRG LVL3 (GOWN DISPOSABLE) ×3 IMPLANT
GOWN STRL REUS W/TWL LRG LVL3 (GOWN DISPOSABLE) ×3
GRASPER SUT TROCAR 14GX15 (MISCELLANEOUS) ×2 IMPLANT
HEMOSTAT ARISTA ABSORB 3G PWDR (MISCELLANEOUS) ×2 IMPLANT
IRRIGATION STRYKERFLOW (MISCELLANEOUS) IMPLANT
IRRIGATOR STRYKERFLOW (MISCELLANEOUS)
IV NS 1000ML (IV SOLUTION) ×1
IV NS 1000ML BAXH (IV SOLUTION) ×1 IMPLANT
KIT TURNOVER KIT A (KITS) ×2 IMPLANT
NEEDLE HYPO 22GX1.5 SAFETY (NEEDLE) ×2 IMPLANT
NEEDLE INSUFFLATION 14GA 120MM (NEEDLE) ×2 IMPLANT
NS IRRIG 1000ML POUR BTL (IV SOLUTION) ×2 IMPLANT
PACK LAP CHOLECYSTECTOMY (MISCELLANEOUS) ×2 IMPLANT
POUCH SPECIMEN RETRIEVAL 10MM (ENDOMECHANICALS) ×2 IMPLANT
SCISSORS METZENBAUM CVD 33 (INSTRUMENTS) ×2 IMPLANT
SLEEVE ENDOPATH XCEL 5M (ENDOMECHANICALS) ×4 IMPLANT
SUT MNCRL AB 4-0 PS2 18 (SUTURE) ×2 IMPLANT
SUT VICRYL 0 UR6 27IN ABS (SUTURE) ×2 IMPLANT
SUT VICRYL AB 3-0 FS1 BRD 27IN (SUTURE) ×2 IMPLANT
TROCAR 5M 150ML BLDLS (TROCAR) ×2 IMPLANT
TROCAR XCEL NON-BLD 11X100MML (ENDOMECHANICALS) ×2 IMPLANT
TROCAR XCEL NON-BLD 5MMX100MML (ENDOMECHANICALS) ×2 IMPLANT
TUBING INSUFFLATION (TUBING) ×2 IMPLANT

## 2018-02-22 NOTE — Transfer of Care (Signed)
Immediate Anesthesia Transfer of Care Note  Patient: Andrea Miller  Procedure(s) Performed: Procedure(s): LAPAROSCOPIC CHOLECYSTECTOMY (N/A)  Patient Location: PACU  Anesthesia Type:General  Level of Consciousness: sedated  Airway & Oxygen Therapy: Patient Spontanous Breathing and Patient connected to face mask oxygen  Post-op Assessment: Report given to RN and Post -op Vital signs reviewed and stable  Post vital signs: Reviewed and stable  Last Vitals:  Vitals:   02/22/18 0629 02/22/18 1023  BP: 126/87 113/63  Pulse: 87 87  Resp: 18 18  Temp: 36.9 C (!) 36.2 C  SpO2: 100% 100%    Complications: No apparent anesthesia complications

## 2018-02-22 NOTE — Op Note (Signed)
SURGICAL OPERATIVE REPORT   DATE OF PROCEDURE: 02/22/2018  ATTENDING Surgeon(s): Ancil Linsey, MD  ANESTHESIA: GETA  PRE-OPERATIVE DIAGNOSIS: Symptomatic Cholelithiasis (K80.20)  POST-OPERATIVE DIAGNOSIS: Chronic cholecystitis (K81.1)  PROCEDURE(S): (cpt's: 47562) 1.) Laparoscopic Cholecystectomy  INTRAOPERATIVE FINDINGS: Nearly completely intra-hepatic gallbladder, initially appearing non-dilated and minimally- to non- inflamed (along the intially visualized apex and body of gallbladder), but subsequent dissection, exposure, and evaluation revealed very severe focal peri-ductal and peri-infundibular inflammation with these structures also encased within extensive fibrotic fat and surprisingly gallbladder hydrops (despite the gallbladder being non-distended); 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 female who recently presented with worsening RUQ > epigastric abdominal pain and nausea, worse after eating fatty foods in particular. She initially attributed her abdominal pain to her chronic fibromyalgia until she developed severe nausea. Ultrasound suggested cholelithiasis without cholecystitis. Patient was offered cholecystectomy in one month, but she presented multiple times to [facility] ED stating she can't wait that long. All risks, benefits, and alternatives to above elective procedures were discussed with the patient, who elected to proceed, and informed consent was accordingly obtained at that time.   DETAILS OF PROCEDURE:  Patient was brought to the operating suite and appropriately identified. General  anesthesia was administered along with peri-operative prophylactic IV antibiotics, and endotracheal intubation was performed by anesthesiologist, along with NG/OG tube for gastric decompression. In supine position, operative site was prepped and draped in usual sterile fashion, and following a brief time out, initial 5 mm incision was made in a natural skin crease just above the umbilicus. Fascia was then elevated, and a Verress needle was inserted and its proper position confirmed using aspiration and saline meniscus test.   Upon insufflation of the abdominal cavity with carbon dioxide to a well-tolerated pressure of 12-15 mmHg, 5 mm peri-umbilical port followed by laparoscope were inserted and used to inspect the abdominal cavity and its contents with no injuries from insertion of the first trochar noted. Three additional trocars were inserted, one at the epigastric position (10 mm) and two along the Right costal margin (5 mm). The table was then placed in reverse Trendelenburg position with somewhat limited Right side up due to patient's morbid obesity. Fitz-Hugh-Curtis-type filmy adhesions from the liver to the anterior abdominal wall and diaphragm were left untouched at this time, and the gallbladder was noted to be nearly entirely intra-hepatic. The initially visualized gallbladder body and apex were initially found to be minimally- to non-inflamed with minimal filmy adhesions between the gallbladder and omentum/duodenum/transverse colon. The apex/dome of the gallbladder was grasped with an atraumatic grasper passed through the lateral port and retracted apically over the liver. The infundibulum was noted to be encased by fat and comparatively severe inflammation extending around the cystic duct and artery. The infundibulum was dissected free from surrounding tissues, grasped and retracted, exposing Calot's triangle. The peritoneum overlying the gallbladder infundibulum was incised and dissected free of  surrounding peritoneal attachments, revealing the cystic duct and cystic artery, which were clipped twice on the patient side and once on the gallbladder specimen side close to the gallbladder after careful and complete identification of corresponding structures. The gallbladder was then dissected from its  peritoneal attachments to the liver using electrocautery, and the gallbladder was placed into a laparoscopic specimen bag and removed from the abdominal cavity via the epigastric port site. Arista was applied, and hemostasis and secure placement of clips were confirmed. Fitz-Hugh-Curtis-type filmy adhesions to the liver were lysed using electrocautery. The intra-peritoneal cavity was then inspected with no additional findings. PMI laparoscopic fascial closure device was then used to re-approximate fascia at the 10 mm epigastric port site.  All ports were then removed under direct visualization, and abdominal cavity was desuflated. All port sites were irrigated/cleaned, additional local anesthetic was injected at each incision, 3-0 Vicryl was used to re-approximate dermis at 10 mm port site(s), and subcuticular 4-0 Monocryl suture was used to re-approximate skin. Skin was then cleaned, dried, and sterile skin glue was applied. Patient was then safely able to be awakened, extubated, and transferred to PACU for post-operative monitoring and care.   I was present for all aspects of the above procedure, and no operative complications were apparent.

## 2018-02-22 NOTE — Anesthesia Postprocedure Evaluation (Signed)
Anesthesia Post Note  Patient: Andrea Miller  Procedure(s) Performed: LAPAROSCOPIC CHOLECYSTECTOMY (N/A )  Patient location during evaluation: PACU Anesthesia Type: General Level of consciousness: awake and alert Pain management: pain level controlled Vital Signs Assessment: post-procedure vital signs reviewed and stable Respiratory status: spontaneous breathing, nonlabored ventilation, respiratory function stable and patient connected to nasal cannula oxygen Cardiovascular status: blood pressure returned to baseline and stable Postop Assessment: no apparent nausea or vomiting Anesthetic complications: no     Last Vitals:  Vitals:   02/22/18 1155 02/22/18 1300  BP: (!) 142/78 (!) 142/86  Pulse: 86   Resp: 18 18  Temp: 36.6 C 36.5 C  SpO2: 99% 98%    Last Pain:  Vitals:   02/22/18 1300  TempSrc: Temporal  PainSc: 5                  Lenard Simmer

## 2018-02-22 NOTE — Addendum Note (Signed)
Addendum  created 02/22/18 1434 by Lenard Simmer, MD   Intraprocedure Event edited

## 2018-02-22 NOTE — Interval H&P Note (Signed)
History and Physical Interval Note:  02/22/2018 7:17 AM  Andrea Miller  has presented today for surgery, with the diagnosis of SYMPTOMATIC CHOLELITHIASIS  The various methods of treatment have been discussed with the patient and family. After consideration of risks, benefits and other options for treatment, the patient has consented to  Procedure(s): LAPAROSCOPIC CHOLECYSTECTOMY (N/A) as a surgical intervention .  The patient's history has been reviewed, patient examined, no change in status, stable for surgery.  I have reviewed the patient's chart and labs.  Questions were answered to the patient's satisfaction.     Ancil Linsey

## 2018-02-22 NOTE — Anesthesia Procedure Notes (Addendum)
Procedure Name: Intubation Date/Time: 02/22/2018 7:39 AM Performed by: Doreen Salvage, CRNA Pre-anesthesia Checklist: Patient identified, Emergency Drugs available, Suction available and Patient being monitored Patient Re-evaluated:Patient Re-evaluated prior to induction Oxygen Delivery Method: Circle system utilized Preoxygenation: Pre-oxygenation with 100% oxygen Induction Type: IV induction Laryngoscope Size: McGraph, Mac and 4 Grade View: Grade I Tube type: Oral Tube size: 7.0 mm Number of attempts: 1 Airway Equipment and Method: Stylet and Video-laryngoscopy Placement Confirmation: ETT inserted through vocal cords under direct vision,  positive ETCO2 and breath sounds checked- equal and bilateral Secured at: 22 cm Tube secured with: Tape Dental Injury: Teeth and Oropharynx as per pre-operative assessment  Difficulty Due To: Difficulty was anticipated

## 2018-02-22 NOTE — Anesthesia Post-op Follow-up Note (Signed)
Anesthesia QCDR form completed.        

## 2018-02-22 NOTE — Anesthesia Preprocedure Evaluation (Addendum)
Anesthesia Evaluation  Patient identified by MRN, date of birth, ID band Patient awake    Reviewed: Allergy & Precautions, H&P , NPO status , Patient's Chart, lab work & pertinent test results, reviewed documented beta blocker date and time   History of Anesthesia Complications Negative for: history of anesthetic complications  Airway Mallampati: III  TM Distance: >3 FB Neck ROM: full    Dental  (+) Poor Dentition, Teeth Intact   Pulmonary neg shortness of breath, asthma , sleep apnea (has not yet been fitted for CPAP) and Continuous Positive Airway Pressure Ventilation , neg COPD, neg recent URI,    Pulmonary exam normal breath sounds clear to auscultation- rhonchi (-) wheezing      Cardiovascular Exercise Tolerance: Poor hypertension, Pt. on medications (-) angina(-) CAD and (-) Past MI Normal cardiovascular exam+ dysrhythmias Supra Ventricular Tachycardia  Rhythm:regular Rate:Normal - Systolic murmurs and - Diastolic murmurs    Neuro/Psych Seizures -, Well Controlled,  PSYCHIATRIC DISORDERS Anxiety Depression Bipolar Disorder  Neuromuscular disease    GI/Hepatic Neg liver ROS, GERD  ,  Endo/Other  neg diabetesMorbid obesity  Renal/GU negative Renal ROS  negative genitourinary   Musculoskeletal  (+) Fibromyalgia -  Abdominal (+) + obese,   Peds  Hematology negative hematology ROS (+)   Anesthesia Other Findings Past Medical History: No date: Anxiety No date: Bipolar disorder (HCC) No date: Chest pain     Comment: and angina No date: Depression No date: Fibromyalgia No date: Hypertension since 08/2014: Left lumbar radiculopathy     Comment: secondary to work accident No date: Obesity No date: Seizures (HCC)     Comment: secondary to anxiety No date: SVT (supraventricular tachycardia) (HCC)     Comment: with hx of syncope; managed by [facility] Past Surgical History: No date: NO PAST SURGERIES   Reproductive/Obstetrics                             Anesthesia Physical  Anesthesia Plan  ASA: III  Anesthesia Plan: General   Post-op Pain Management:    Induction: Intravenous  PONV Risk Score and Plan: 3 and Ondansetron and Dexamethasone  Airway Management Planned: Oral ETT  Additional Equipment:   Intra-op Plan:   Post-operative Plan: Extubation in OR  Informed Consent: I have reviewed the patients History and Physical, chart, labs and discussed the procedure including the risks, benefits and alternatives for the proposed anesthesia with the patient or authorized representative who has indicated his/her understanding and acceptance.   Dental Advisory Given  Plan Discussed with: CRNA and Anesthesiologist  Anesthesia Plan Comments:        Anesthesia Quick Evaluation

## 2018-02-22 NOTE — Progress Notes (Signed)
Chaplain provided pastoral presence, offered simple meditation technique to control anxiety, prayer, and emotional support. Anxiety quickly returns after intervention.

## 2018-02-22 NOTE — Discharge Instructions (Addendum)
In addition to included general post-operative instructions for Laparoscopic Cholecystectomy,  Diet: Resume home heart healthy diet (start with low fat and gradually advance as tolerated, though may initially have some loose BM's with fatty foods).   Activity: No heavy lifting >20 pounds (children, pets, laundry, garbage) or strenuous activity until follow-up, but light activity and walking are encouraged. Do not drive or drink alcohol if taking narcotic pain medications.  Wound care: 2 days after surgery (Sunday, 3/31), may shower/get incision wet with soapy water and pat dry (do not rub incisions), but no baths or submerging incision underwater until follow-up.   Medications: Resume all home medications. For mild to moderate pain: acetaminophen (Tylenol) or ibuprofen/naproxen (if no kidney disease). Combining Tylenol with alcohol can substantially increase your risk of causing liver disease. Narcotic pain medications, if prescribed, can be used for severe pain, though may cause nausea, constipation, and drowsiness. Do not combine Tylenol and Percocet (or similar) within a 6 hour period as Percocet (and similar) contain(s) Tylenol. If you do not need the narcotic pain medication, you do not need to fill the prescription.  Call office [phone]) at any time if any questions, worsening pain, fevers/chills, bleeding, drainage from incision site, or other concerns.    AMBULATORY SURGERY  DISCHARGE INSTRUCTIONS   1) The drugs that you were given will stay in your system until tomorrow so for the next 24 hours you should not:  A) Drive an automobile B) Make any legal decisions C) Drink any alcoholic beverage   2) You may resume regular meals tomorrow.  Today it is better to start with liquids and gradually work up to solid foods.  You may eat anything you prefer, but it is better to start with liquids, then soup and crackers, and gradually work up to solid foods.   3) Please notify your  doctor immediately if you have any unusual bleeding, trouble breathing, redness and pain at the surgery site, drainage, fever, or pain not relieved by medication.    4) Additional Instructions:        Please contact your physician with any problems or Same Day Surgery at [phone], Monday through Friday 6 am to 4 pm, or [place] at [facility] number at [phone].

## 2018-02-25 LAB — SURGICAL PATHOLOGY

## 2018-02-28 ENCOUNTER — Encounter: Payer: Self-pay | Admitting: Surgery

## 2018-02-28 ENCOUNTER — Ambulatory Visit (INDEPENDENT_AMBULATORY_CARE_PROVIDER_SITE_OTHER): Payer: BLUE CROSS/BLUE SHIELD | Admitting: Surgery

## 2018-02-28 VITALS — BP 142/95 | HR 82 | Temp 98.6°F | Resp 16 | Ht 70.0 in | Wt 377.5 lb

## 2018-02-28 DIAGNOSIS — Z4889 Encounter for other specified surgical aftercare: Secondary | ICD-10-CM

## 2018-02-28 DIAGNOSIS — K8020 Calculus of gallbladder without cholecystitis without obstruction: Secondary | ICD-10-CM

## 2018-02-28 DIAGNOSIS — G8918 Other acute postprocedural pain: Secondary | ICD-10-CM

## 2018-02-28 MED ORDER — OXYCODONE-ACETAMINOPHEN 5-325 MG PO TABS: 1.0000 | ORAL_TABLET | Freq: Four times a day (QID) | ORAL | 0 refills | Status: DC | PRN

## 2018-02-28 NOTE — Patient Instructions (Signed)
Take pain medication as needed. Start reducing your dosage.  Follow up with Dr Melvenia Beam for your asthma symptoms Follow up here as scheduled, but if feeling better just call with an update instead.  Start Prilosec OTC once daily for indigestion.

## 2018-02-28 NOTE — Progress Notes (Signed)
Surgical Clinic Progress/Follow-up Note   HPI:  [name] presents to clinic for post-op follow-up evaluation s/p laparoscopic cholecystectomy for severely symptomatic cholelithiasis. Patient reports complete resolution of pre-operative post-prandial RUQ abdominal pain with nausea and emesis and has been tolerating regular diet with +flatus and normal BM's, denies fever/chills, CP, or SOB. However, patient says she continues to experience pain to the Right of her epigastric incision, particularly when trying to lay on her side to sleep at night, and used all of her prescribed post-surgical narcotic pain medication over the first few days after her recent surgery.  Review of Systems:  Constitutional: denies any other weight loss, fever, chills, or sweats  Eyes: denies any other vision changes, history of eye injury  ENT: denies sore throat, hearing problems  Respiratory: denies shortness of breath, wheezing  Cardiovascular: denies chest pain, palpitations  Gastrointestinal: abdominal pain, N/V, and bowel function as per HPI Musculoskeletal: denies any other joint pains or cramps  Skin: Denies any other rashes or skin discolorations  Neurological: denies any other headache, dizziness, weakness  Psychiatric: denies any other depression, anxiety  All other review of systems: otherwise negative   Vital Signs:  BP (!) 142/95   Pulse 82   Temp 98.6 F (37 C)   Resp 16   Ht 5\' 10"  (1.778 m)   Wt (!) 377 lb 8 oz (171.2 kg)   LMP 02/27/2018 (Exact Date)   SpO2 99%   BMI 54.17 kg/m    Physical Exam:  Constitutional:  -- Obese body habitus  -- Awake, alert, and oriented x3  Eyes:  -- Pupils equally round and reactive to light  -- No scleral icterus  Ear, nose, throat:  -- No jugular venous distension  -- No nasal drainage, bleeding Pulmonary:  -- No crackles -- Equal breath sounds bilaterally -- Breathing non-labored at rest Cardiovascular:  -- S1, S2 present  -- No  pericardial rubs  Gastrointestinal:  -- Soft and obese, but non-distended, with mild-/moderate- Right of epigastric incision tenderness to palpation without guarding or rebound tenderness -- Incisions well-approximated without surrounding erythema or drainage -- No abdominal masses appreciated, pulsatile or otherwise  Musculoskeletal / Integumentary:  -- Wounds or skin discoloration: None appreciated except post-surgical wounds as described above (GI) -- Extremities: B/L UE and LE FROM, hands and feet warm  Neurologic:  -- Motor function: intact and symmetric  -- Sensation: intact and symmetric    Assessment:  32 y.o. yo Female with a problem list including...  Patient Active Problem List   Diagnosis Date Noted  . Edema 02/13/2018  . Diabetes mellitus type 2, uncomplicated (HCC) 02/13/2018  . Hypertension 11/23/2017  . Asthma exacerbation 04/14/2017  . Asthma without status asthmaticus 01/16/2017  . Bipolar affective (HCC) 01/16/2017  . Coronary artery disease 01/16/2017  . Syncope 12/12/2016  . Fibromyalgia 04/27/2014  . OSA (obstructive sleep apnea) 04/27/2014  . Seizure-like activity (HCC) 04/27/2014  . Migraine without status migrainosus, not intractable 04/27/2014  . Obesity 04/15/2014  . Sleep disorder 04/15/2014  . Neck pain 04/15/2014  . Headache 04/15/2014  . Restless legs syndrome 04/15/2014  . Supraventricular tachycardia (HCC) 07/13/2009    presents to clinic for post-op follow-up evaluation, doing well except with Right of epigastric peri-incisional pain 1 week s/p laparoscopic cholecystectomy for severely symptomatic cholelithiasis with both RUQ abdominal pain and nausea with frequent emesis (since resolved).  Plan:              - advance diet as tolerated              -  okay to shower, but do not submerge incisions until 1 more week             - no heavy lifting >15-20 lbs x2 weeks, after which may gradually resume over next 2 wks  - post-surgical  prescription for Percocet 5/325 mg extended x10 to bridge discontinuation  - considering appropriate location of post-cholecystectomy pain and asymptomatic otherwise, no indication for further ultrasound or labs at this time             - return to clinic as scheduled, instructed to call office if any questions or concerns  All of the above recommendations were discussed with the patient and patient's family, and all of patient's and family's questions were answered to their expressed satisfaction.  -- Scherrie Gerlach Earlene Plater, MD, RPVI [place]: [facility] - Partnering for exceptional care. Office: [phone]

## 2018-03-01 ENCOUNTER — Inpatient Hospital Stay: Admission: RE | Admit: 2018-03-01 | Payer: Self-pay | Source: Ambulatory Visit

## 2018-03-05 DIAGNOSIS — K811 Chronic cholecystitis: Secondary | ICD-10-CM

## 2018-03-08 ENCOUNTER — Encounter: Payer: Self-pay | Admitting: Surgery

## 2018-03-08 ENCOUNTER — Encounter: Payer: BLUE CROSS/BLUE SHIELD | Admitting: Surgery

## 2018-03-08 ENCOUNTER — Ambulatory Visit: Admit: 2018-03-08 | Payer: BLUE CROSS/BLUE SHIELD | Admitting: General Surgery

## 2018-03-08 SURGERY — LAPAROSCOPIC CHOLECYSTECTOMY WITH INTRAOPERATIVE CHOLANGIOGRAM
Anesthesia: General

## 2018-03-14 ENCOUNTER — Telehealth: Payer: Self-pay | Admitting: Surgery

## 2018-03-14 NOTE — Telephone Encounter (Signed)
Patient no showed appointment on 03/08/18 with Dr. Piscoya, I have mailed a letter for the patient to contact our office and r/s no showed appointment.please r/s if patient calls. °

## 2018-03-29 ENCOUNTER — Encounter: Payer: Self-pay | Admitting: Emergency Medicine

## 2018-03-29 ENCOUNTER — Emergency Department: Payer: BLUE CROSS/BLUE SHIELD

## 2018-03-29 ENCOUNTER — Other Ambulatory Visit: Payer: Self-pay

## 2018-03-29 ENCOUNTER — Emergency Department
Admission: EM | Admit: 2018-03-29 | Discharge: 2018-03-29 | Disposition: A | Discharge status: Home / Self Care | Payer: BLUE CROSS/BLUE SHIELD | Attending: Student in an Organized Health Care Education/Training Program | Admitting: Student in an Organized Health Care Education/Training Program

## 2018-03-29 DIAGNOSIS — I10 Essential (primary) hypertension: Secondary | ICD-10-CM | POA: Insufficient documentation

## 2018-03-29 DIAGNOSIS — M7918 Myalgia, other site: Secondary | ICD-10-CM | POA: Insufficient documentation

## 2018-03-29 DIAGNOSIS — I2510 Atherosclerotic heart disease of native coronary artery without angina pectoris: Secondary | ICD-10-CM | POA: Insufficient documentation

## 2018-03-29 DIAGNOSIS — Z79899 Other long term (current) drug therapy: Secondary | ICD-10-CM | POA: Diagnosis not present

## 2018-03-29 DIAGNOSIS — J45909 Unspecified asthma, uncomplicated: Secondary | ICD-10-CM | POA: Diagnosis not present

## 2018-03-29 DIAGNOSIS — E119 Type 2 diabetes mellitus without complications: Secondary | ICD-10-CM | POA: Diagnosis not present

## 2018-03-29 MED ORDER — TRAMADOL HCL 50 MG PO TABS: 50.0000 mg | ORAL_TABLET | Freq: Four times a day (QID) | ORAL | 0 refills | Status: DC | PRN

## 2018-03-29 MED ORDER — KETOROLAC TROMETHAMINE 10 MG PO TABS: 10.0000 mg | ORAL_TABLET | Freq: Four times a day (QID) | ORAL | 0 refills | Status: DC | PRN

## 2018-03-29 MED ORDER — ONDANSETRON 8 MG PO TBDP
8.0000 mg | ORAL_TABLET | Freq: Once | ORAL | Status: AC
  Administered 2018-03-29: 8 mg via ORAL
  Filled 2018-03-29: qty 1

## 2018-03-29 MED ORDER — CYCLOBENZAPRINE HCL 10 MG PO TABS
10.0000 mg | ORAL_TABLET | Freq: Once | ORAL | Status: AC
  Administered 2018-03-29: 10 mg via ORAL
  Filled 2018-03-29: qty 1

## 2018-03-29 MED ORDER — KETOROLAC TROMETHAMINE 60 MG/2ML IM SOLN
60.0000 mg | Freq: Once | INTRAMUSCULAR | Status: AC
  Administered 2018-03-29: 60 mg via INTRAMUSCULAR
  Filled 2018-03-29: qty 2

## 2018-03-29 MED ORDER — CYCLOBENZAPRINE HCL 10 MG PO TABS: 10.0000 mg | ORAL_TABLET | Freq: Three times a day (TID) | ORAL | 0 refills | Status: DC | PRN
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
## 2018-05-06 ENCOUNTER — Encounter: Payer: Self-pay | Admitting: Dietician

## 2018-05-06 ENCOUNTER — Encounter: Payer: No Typology Code available for payment source | Attending: Nurse Practitioner | Admitting: Dietician

## 2018-05-06 DIAGNOSIS — I519 Heart disease, unspecified: Secondary | ICD-10-CM

## 2018-05-06 DIAGNOSIS — R7303 Prediabetes: Secondary | ICD-10-CM
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
## 2018-05-06 NOTE — Patient Instructions (Signed)
Establish a consistent meal pattern of 3 meals and 3 small snacks. Ex. 9:00am, 11:00 small snack,  1:00pm lunch, 4:00 snack,  8:00 dinner Decrease fruit juice or fruit punch with goal of eliminating from diet. Increase water intake and try Crystal lite pkts to add to water. Balance meals with protein (meat, fish, nuts, peanut butter, cheese), 3-4 servings of carbohydrate (starch, fruit, lactose free milk, yogurt ) and non-starchy vegetables (listed as free on handout). Use food guide plate and menu examples as guide. Consider purchasing some measuring cups to help with portions. Keep at least 1 week of food records for next visit.

## 2018-05-09 ENCOUNTER — Other Ambulatory Visit: Payer: Self-pay

## 2018-05-09 ENCOUNTER — Emergency Department: Payer: BLUE CROSS/BLUE SHIELD

## 2018-05-09 ENCOUNTER — Encounter: Payer: Self-pay | Admitting: Emergency Medicine

## 2018-05-09 ENCOUNTER — Emergency Department
Admission: EM | Admit: 2018-05-09 | Discharge: 2018-05-10 | Disposition: A | Discharge status: Home / Self Care | Payer: BLUE CROSS/BLUE SHIELD | Attending: Emergency Medicine | Admitting: Emergency Medicine

## 2018-05-09 DIAGNOSIS — I2510 Atherosclerotic heart disease of native coronary artery without angina pectoris: Secondary | ICD-10-CM | POA: Diagnosis not present

## 2018-05-09 DIAGNOSIS — J45909 Unspecified asthma, uncomplicated: Secondary | ICD-10-CM | POA: Insufficient documentation

## 2018-05-09 DIAGNOSIS — I10 Essential (primary) hypertension: Secondary | ICD-10-CM | POA: Diagnosis not present

## 2018-05-09 DIAGNOSIS — M5416 Radiculopathy, lumbar region: Secondary | ICD-10-CM

## 2018-05-09 DIAGNOSIS — M5136 Other intervertebral disc degeneration, lumbar region: Secondary | ICD-10-CM

## 2018-05-09 DIAGNOSIS — E119 Type 2 diabetes mellitus without complications: Secondary | ICD-10-CM | POA: Diagnosis not present

## 2018-05-09 DIAGNOSIS — Z79899 Other long term (current) drug therapy: Secondary | ICD-10-CM | POA: Diagnosis not present

## 2018-05-09 DIAGNOSIS — M5126 Other intervertebral disc displacement, lumbar region: Secondary | ICD-10-CM

## 2018-05-09 DIAGNOSIS — M545 Low back pain: Secondary | ICD-10-CM | POA: Diagnosis present

## 2018-05-09 DIAGNOSIS — M5116 Intervertebral disc disorders with radiculopathy, lumbar region: Secondary | ICD-10-CM | POA: Diagnosis not present

## 2018-05-09 LAB — POCT PREGNANCY, URINE: Preg Test, Ur: NEGATIVE

## 2018-05-09 MED ORDER — MORPHINE SULFATE (PF) 4 MG/ML IV SOLN
4.0000 mg | Freq: Once | INTRAVENOUS | Status: AC
  Administered 2018-05-09: 4 mg via INTRAVENOUS
  Filled 2018-05-09: qty 1

## 2018-05-09 MED ORDER — ONDANSETRON HCL 4 MG/2ML IJ SOLN
4.0000 mg | Freq: Once | INTRAMUSCULAR | Status: AC
  Administered 2018-05-09: 4 mg via INTRAVENOUS
  Filled 2018-05-09: qty 2

## 2018-05-09 MED ORDER — SODIUM CHLORIDE 0.9 % IV BOLUS
1000.0000 mL | Freq: Once | INTRAVENOUS | Status: AC
  Administered 2018-05-09: 1000 mL via INTRAVENOUS
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
## 2018-05-09 NOTE — ED Notes (Signed)
Pt ambulated to room 51 with steady gait

## 2018-05-09 NOTE — ED Notes (Signed)
Pt states always needs ultrasound guided iv. Consult placed.
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
## 2018-05-09 NOTE — ED Notes (Addendum)
Pt to xray

## 2018-05-10 ENCOUNTER — Emergency Department: Payer: BLUE CROSS/BLUE SHIELD

## 2018-05-10 MED ORDER — OMEPRAZOLE 20 MG PO CPDR: 20.0000 mg | DELAYED_RELEASE_CAPSULE | Freq: Two times a day (BID) | ORAL | 1 refills | Status: DC

## 2018-05-10 MED ORDER — LORAZEPAM 2 MG/ML IJ SOLN
1.0000 mg | Freq: Once | INTRAMUSCULAR | Status: AC
  Administered 2018-05-10: 1 mg via INTRAVENOUS

## 2018-05-10 MED ORDER — LORAZEPAM 2 MG/ML IJ SOLN
INTRAMUSCULAR | Status: AC
  Filled 2018-05-10: qty 1

## 2018-05-10 MED ORDER — PREDNISONE 10 MG PO TABS: ORAL_TABLET | ORAL | 0 refills | Status: DC

## 2018-05-10 NOTE — Discharge Instructions (Addendum)
As we discussed, your MRI was reassuring.  You have a bulging disc in your back that is gotten a little bit worse in the last 3 years but still does not require emergent surgery.  The neurosurgeon recommended that you try a prednisone taper to help with inflammation.  We recommend that you alternate doses of Tylenol 1000 mg and ibuprofen 600 mg (with some food to protect her stomach) so that you are having one or the other every 3 hours, and each medication is not repeated more often than every 6 hours.  If you do choose to take ibuprofen along with the prednisone, please also take a PPI such as omeprazole; it is important to help protect her stomach.  You were given a prescription for this as well, although it is available over-the-counter.  Please follow-up with the neurosurgeon at the contact information listed above.  Return to the emergency department if you develop new or worsening symptoms that concern you.

## 2018-05-10 NOTE — ED Provider Notes (Signed)
-----------------------------------------   12:09 AM on 05/10/2018 -----------------------------------------   Assuming care from [facility].  In short, Andrea Miller is a 32 y.o. female with a chief complaint of low back pain with some radicular symptoms.  Refer to the original H&P for additional details.  The current plan of care is to follow up on the MRI and determine whether the patient needs urgent neurosurgical consultation or outpatient follow up.    ----------------------------------------- 1:29 AM on 05/10/2018 -----------------------------------------  Checked [place] Controlled Substance Database; patient has an overdose risk score of 820 (out of a total of 999) due to chronic sedative and stimulant prescriptions.  Although she does not currently have any active narcotics prescriptions, I am not comfortable writing for narcotics for chronic back pain.  I spoke with Dr. Myer Haff with neurosurgery and described the clinical scenario and the MRI results.  He said that since she is having some acute radicular symptoms he thought a Medrol Dosepak would be appropriate or similar prednisone taper.  He recommended against Flexeril and narcotics.  He suggested that she take scheduled doses of ibuprofen and Tylenol and he will see her in clinic in follow-up.  I will update her with this plan and recommendations.  Gave Rx for both prednisone taper and omeprazole to protect her stomach since she will also be taking ibuprofen.     Mr Lumbar Spine Wo Contrast  Result Date: 05/10/2018 CLINICAL DATA:  Worsening low back pain EXAM: MRI LUMBAR SPINE WITHOUT CONTRAST TECHNIQUE: Multiplanar, multisequence MR imaging of the lumbar spine was performed. No intravenous contrast was administered. COMPARISON:  Lumbar spine radiograph 05/09/2018 Lumbar spine MRI 07/13/2015 FINDINGS: Segmentation: Normal. The lowest disc space is considered to be L5-S1. Alignment:  Normal Vertebrae: No acute compression  fracture, discitis-osteomyelitis of focal marrow lesion. Conus medullaris and cauda equina: The conus medullaris terminates at the L1 level. The cauda equina and conus medullaris are both normal. Paraspinal and other soft tissues: The visualized aorta, IVC and iliac vessels are normal. The visualized retroperitoneal organs and paraspinal soft tissues are normal. Disc levels: T11-T12 is visualized in the sagittal plane only, with no disc herniation or stenosis. The T12-L5 disc levels are normal. L5-S1: There is a medium-sized central disc extrusion with inferior migration that narrows both lateral recesses and slightly displaces the descending S1 nerve roots. Mild central spinal canal stenosis, slightly progressed from prior. The visualized sacrum is normal. IMPRESSION: L5-S1 medium-sized disc extrusion narrowing both lateral recesses and mildly displacing the descending S1 nerve roots. Mild central spinal canal stenosis at this level has slightly increased. Electronically Signed   By: Deatra Robinson M.D.   On: 05/10/2018 01:21      Loleta Rose, MD 05/10/18 [phone]

## 2018-05-10 NOTE — ED Notes (Signed)
Christiane Ha, [place], informed this RN that some type of anxiolytic will be required for the pt d/t issues with claustrophobia; information will be shared with accepting RN when pt transferred to ED Rm 19.

## 2018-05-24 ENCOUNTER — Ambulatory Visit: Payer: Self-pay | Admitting: Dietician

## 2018-06-10 ENCOUNTER — Ambulatory Visit: Payer: Self-pay | Admitting: Dietician

## 2018-06-22 ENCOUNTER — Encounter: Payer: Self-pay | Admitting: Emergency Medicine

## 2018-06-22 ENCOUNTER — Emergency Department: Payer: BLUE CROSS/BLUE SHIELD

## 2018-06-22 ENCOUNTER — Other Ambulatory Visit: Payer: Self-pay

## 2018-06-22 ENCOUNTER — Emergency Department
Admission: EM | Admit: 2018-06-22 | Discharge: 2018-06-22 | Disposition: A | Discharge status: Home / Self Care | Payer: BLUE CROSS/BLUE SHIELD | Attending: Emergency Medicine | Admitting: Emergency Medicine

## 2018-06-22 DIAGNOSIS — R0602 Shortness of breath: Secondary | ICD-10-CM | POA: Diagnosis not present

## 2018-06-22 DIAGNOSIS — Z79899 Other long term (current) drug therapy: Secondary | ICD-10-CM | POA: Diagnosis not present

## 2018-06-22 DIAGNOSIS — R079 Chest pain, unspecified: Secondary | ICD-10-CM

## 2018-06-22 DIAGNOSIS — R0789 Other chest pain: Secondary | ICD-10-CM | POA: Insufficient documentation

## 2018-06-22 DIAGNOSIS — I10 Essential (primary) hypertension: Secondary | ICD-10-CM | POA: Insufficient documentation

## 2018-06-22 LAB — BASIC METABOLIC PANEL
Anion gap: 9 (ref 5–15)
BUN: <5 mg/dL — ABNORMAL LOW (ref 6–20)
CO2: 27 mmol/L (ref 22–32)
Calcium: 9.3 mg/dL (ref 8.9–10.3)
Chloride: 106 mmol/L (ref 98–111)
Creatinine, Ser: 0.72 mg/dL (ref 0.44–1.00)
GFR calc Af Amer: >60 mL/min (ref >60)
GFR calc non Af Amer: >60 mL/min (ref >60)
Glucose, Bld: 126 mg/dL — ABNORMAL HIGH (ref 70–99)
Potassium: 3.3 mmol/L — ABNORMAL LOW (ref 3.5–5.1)
Sodium: 142 mmol/L (ref 135–145)

## 2018-06-22 LAB — CBC
HCT: 38.5 % (ref 35.0–47.0)
Hemoglobin: 12.5 g/dL (ref 12.0–16.0)
MCH: 27.2 pg (ref 26.0–34.0)
MCHC: 32.6 g/dL (ref 32.0–36.0)
MCV: 83.4 fL (ref 80.0–100.0)
Platelets: 247 10*3/uL (ref 150–440)
RBC: 4.61 MIL/uL (ref 3.80–5.20)
RDW: 14.8 % — ABNORMAL HIGH (ref 11.5–14.5)
WBC: 4.7 10*3/uL (ref 3.6–11.0)

## 2018-06-22 LAB — TROPONIN I
Troponin I: <0.03 ng/mL (ref <0.03)
Troponin I: <0.03 ng/mL (ref <0.03)

## 2018-06-22 MED ORDER — SODIUM CHLORIDE 0.9 % IV BOLUS
1000.0000 mL | Freq: Once | INTRAVENOUS | Status: DC
Start: 2018-06-22 — End: 2018-06-22

## 2018-06-22 MED ORDER — IOPAMIDOL (ISOVUE-370) INJECTION 76%
75.0000 mL | Freq: Once | INTRAVENOUS | Status: AC | PRN
  Administered 2018-06-22: 75 mL via INTRAVENOUS

## 2018-06-22 MED ORDER — KETOROLAC TROMETHAMINE 30 MG/ML IJ SOLN
15.0000 mg | Freq: Once | INTRAMUSCULAR | Status: AC
  Administered 2018-06-22: 15 mg via INTRAVENOUS
  Filled 2018-06-22: qty 1

## 2018-06-22 MED ORDER — FUROSEMIDE 40 MG PO TABS: 20.0000 mg | ORAL_TABLET | Freq: Two times a day (BID) | ORAL | 0 refills | Status: DC

## 2018-06-22 MED ORDER — FUROSEMIDE 10 MG/ML IJ SOLN
40.0000 mg | Freq: Once | INTRAMUSCULAR | Status: AC
  Administered 2018-06-22: 40 mg via INTRAVENOUS
  Filled 2018-06-22: qty 4

## 2018-06-22 NOTE — ED Notes (Signed)
Lab in triage to draw blood at this time.

## 2018-06-22 NOTE — ED Notes (Signed)
Pt refused blood draw by this RN. States "I'll just wait." placed in middle hallway of triage in chairs to wait for lab to come draw pt blood.

## 2018-06-22 NOTE — ED Triage Notes (Signed)
Pt to ED via POV c/o chest pain and shortness of breath for the past 2 day but has gotten progressively worse. Pt also has swelling in bilateral lower extremities. Pt reports chest pain is non-radiating. Pt states that pain and shortness of breath are worse with activity. Pt is in NAD at this time.

## 2018-06-22 NOTE — ED Provider Notes (Signed)
[facility] Emergency Department Provider Note  ____________________________________________  Time seen: Approximately 9:54 PM  I have reviewed the triage vital signs and the nursing notes.   HISTORY  Chief Complaint Chest Pain    HPI Andrea Miller is a 32 y.o. female who complains of chest pain shortness of breath for the past 2 days, intermittent, nonradiating, associate with shortness of breath, no vomiting or diaphoresis.  Worse with activity, no alleviating factors.  Feels pleuritic when present.      Past Medical History:  Diagnosis Date  . Anxiety   . Asthma   . Bipolar disorder (HCC)   . Chest pain    and angina  . Depression   . Fibromyalgia   . Hypertension   . 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 Obesity   . Pre-diabetes   . Seizures (HCC)    secondary to anxiety  last seizure 01/25/18  . SVT (supraventricular tachycardia) (HCC)    with hx of syncope; managed by [facility]     Patient Active Problem List   Diagnosis Date Noted  . Chronic cholecystitis   . Edema 02/13/2018  . Diabetes mellitus type 2, uncomplicated (HCC) 02/13/2018  . Hypertension 11/23/2017  . Asthma exacerbation 04/14/2017  . Asthma without status asthmaticus 01/16/2017  . Bipolar affective (HCC) 01/16/2017  . Coronary artery disease 01/16/2017  . Syncope 12/12/2016  . Fibromyalgia 04/27/2014  . OSA (obstructive sleep apnea) 04/27/2014  . Seizure-like activity (HCC) 04/27/2014  . Migraine without status migrainosus, not intractable 04/27/2014  . Obesity 04/15/2014  . Sleep disorder 04/15/2014  . Neck pain 04/15/2014  . Headache 04/15/2014  . Restless legs syndrome 04/15/2014  . Supraventricular tachycardia (HCC) 07/13/2009     Past Surgical History:  Procedure Laterality Date  . CHOLECYSTECTOMY N/A 02/22/2018   Procedure: LAPAROSCOPIC CHOLECYSTECTOMY;  Surgeon: Ancil Linsey, MD;  Location: [facility] ORS;  Service:  General;  Laterality: N/A;  . TONSILLECTOMY AND ADENOIDECTOMY N/A 09/05/2016   Procedure: TONSILLECTOMY AND ADENOIDECTOMY;  Surgeon: Linus Salmons, MD;  Location: [facility] ORS;  Service: ENT;  Laterality: N/A;     Prior to Admission medications   Medication Sig Start Date End Date Taking? Authorizing Provider  albuterol (PROVENTIL HFA;VENTOLIN HFA) 108 (90 Base) MCG/ACT inhaler Inhale 2 puffs into the lungs every 6 (six) hours as needed for wheezing or shortness of breath. 01/31/18   Merwyn Katos, MD  albuterol (PROVENTIL) (2.5 MG/3ML) 0.083% nebulizer solution Take 2.5 mg by nebulization every 4 (four) hours as needed for wheezing or shortness of breath.    [provider]  budesonide-formoterol (SYMBICORT) 160-4.5 MCG/ACT inhaler Inhale 2 puffs into the lungs 2 (two) times daily. Patient not taking: Reported on 05/06/2018 01/31/18   Merwyn Katos, MD  Calcium-Magnesium 500-250 MG TABS Take by mouth.    [provider]  Cariprazine HCl (VRAYLAR) 6 MG CAPS Take 6 mg by mouth daily.    [provider]  clonazePAM (KLONOPIN) 2 MG tablet Take 2-4 mg by mouth See admin instructions. Take 2 mgs in the morning and 4 mg at bedtime    [provider]  cyclobenzaprine (FLEXERIL) 10 MG tablet Take 1 tablet (10 mg total) by mouth 3 (three) times daily as needed. 03/29/18   Joni Reining, PA-C  Dexlansoprazole (DEXILANT) 30 MG capsule Take 30 mg by mouth daily.    [provider]  diltiazem (CARDIZEM CD) 360 MG 24 hr capsule Take  360 mg by mouth daily.     [provider]  docusate sodium (COLACE) 100 MG capsule Take 100 mg by mouth 2 (two) times daily.    [provider]  escitalopram (LEXAPRO) 20 MG tablet Take 20 mg by mouth daily.    [provider]  fluticasone (FLONASE) 50 MCG/ACT nasal spray Place 2 sprays into both nostrils daily.    [provider]  folic acid (FOLVITE) 1 MG tablet Take 1 mg by mouth daily.  12/15/16    [provider]  furosemide (LASIX) 40 MG tablet Take 40 mg by mouth daily as needed. Feet swelling    [provider]  gabapentin (NEURONTIN) 300 MG capsule Take 300 mg by mouth 3 (three) times daily.    [provider]  ibuprofen (ADVIL,MOTRIN) 600 MG tablet Take 1 tablet (600 mg total) by mouth every 6 (six) hours as needed. Patient taking differently: Take 600 mg by mouth every 6 (six) hours as needed for moderate pain.  12/22/16   Joni Reining, PA-C  ketorolac (TORADOL) 10 MG tablet Take 1 tablet (10 mg total) by mouth every 6 (six) hours as needed. Patient not taking: Reported on 05/06/2018 03/29/18   Joni Reining, PA-C  linaclotide [facility]) 290 MCG CAPS capsule Take 290 mcg by mouth daily before breakfast.    [provider]  montelukast (SINGULAIR) 10 MG tablet Take 1 tablet (10 mg total) by mouth at bedtime. 01/31/18 01/31/19  Merwyn Katos, MD  omeprazole (PRILOSEC) 20 MG capsule Take 1 capsule (20 mg total) by mouth 2 (two) times daily. 05/10/18 05/10/19  Loleta Rose, MD  oxyCODONE-acetaminophen (PERCOCET/ROXICET) 5-325 MG tablet Take 1 tablet by mouth every 6 (six) hours as needed for severe pain. Patient not taking: Reported on 05/06/2018 02/28/18   Ancil Linsey, MD  potassium chloride (K-DUR) 10 MEQ tablet Take 10 meq once daily then the next day take 10 meq twice daily    [provider]  predniSONE (DELTASONE) 10 MG tablet Take 6 tabs (60 mg) PO daily x 4 days, then take 4 tabs (40 mg) PO daily x 4 days, then take 2 tabs (20 mg) PO daily x 4 days, then take 1 tab (10 mg) PO daily x 4 days, then take 1/2 tab (5 mg) PO daily x 4 days. 05/10/18   Loleta Rose, MD  pregabalin (LYRICA) 100 MG capsule Take 100 mg by mouth 2 (two) times daily.    [provider]  promethazine (PHENERGAN) 12.5 MG tablet Take 2 tablets (25 mg total) by mouth every 6 (six) hours as needed for nausea or vomiting. Patient not taking: Reported on 05/06/2018  02/20/18   Governor Rooks, MD  propranolol (INDERAL) 40 MG tablet Take 40 mg by mouth 3 (three) times daily.    [provider]  risperiDONE (RISPERDAL) 1 MG tablet Take 1 mg by mouth daily.    [provider]  rosuvastatin (CRESTOR) 20 MG tablet Take 20 mg by mouth daily.    [provider]  tiZANidine (ZANAFLEX) 4 MG tablet Take 4 mg by mouth 3 (three) times daily.  03/26/17   [provider]  topiramate (TOPAMAX) 50 MG tablet Take 100 mg by mouth 2 (two) times daily.    [provider]  Topiramate ER (TROKENDI XR) 200 MG CP24 Take by mouth.    [provider]  traMADol (ULTRAM) 50 MG tablet Take 1 tablet (50 mg total) by mouth every 6 (six)  hours as needed. Patient not taking: Reported on 05/06/2018 03/29/18   Joni Reining, PA-C  VYVANSE 70 MG capsule Take 70 mg by mouth daily.  02/19/18   [provider]     Allergies Cortizone-10 [hydrocortisone]; Garlic; and Corticosteroids   Family History  Problem Relation Age of Onset  . Diabetes Mother   . Hypertension Mother   . Asthma Mother   . Gallstones Mother   . Diabetes Father   . Hypertension Father   . Gallstones Father     Social History Social History   Tobacco Use  . Smoking status: Never Smoker  . Smokeless tobacco: Never Used  Substance Use Topics  . Alcohol use: No  . Drug use: No    Review of Systems  Constitutional:   No fever or chills.  ENT:   No sore throat. No rhinorrhea. Cardiovascular:   Positive as above chest pain without syncope. Respiratory:   Positive as above shortness of breath with nonproductive cough. Gastrointestinal:   Negative for abdominal pain, vomiting and diarrhea.  Musculoskeletal:   Positive bilateral leg swelling and pain in the ankles and calves All other systems reviewed and are negative except as documented above in ROS and HPI.  ____________________________________________   PHYSICAL EXAM:  VITAL SIGNS: ED Triage  Vitals  Enc Vitals Group     BP 06/22/18 1145 (!) 156/90     Pulse Rate 06/22/18 1145 (!) 105     Resp 06/22/18 1145 16     Temp 06/22/18 1145 98.4 F (36.9 C)     Temp Source 06/22/18 1145 Oral     SpO2 06/22/18 1145 100 %     Weight 06/22/18 1146 (!) 370 lb (167.8 kg)     Height 06/22/18 1146 5\' 10"  (1.778 m)     Head Circumference --      Peak Flow --      Pain Score 06/22/18 1146 10     Pain Loc --      Pain Edu? --      Excl. in GC? --     Vital signs reviewed, nursing assessments reviewed.   Constitutional:   Alert and oriented. Non-toxic appearance. Eyes:   Conjunctivae are normal. EOMI. PERRL. ENT      Head:   Normocephalic and atraumatic.      Nose:   No congestion/rhinnorhea.       Mouth/Throat:   MMM, no pharyngeal erythema. No peritonsillar mass.       Neck:   No meningismus. Full ROM. Hematological/Lymphatic/Immunilogical:   No cervical lymphadenopathy. Cardiovascular:   RRR. Symmetric bilateral radial and DP pulses.  No murmurs. Cap refill less than 2 seconds. Respiratory:   Normal respiratory effort without tachypnea/retractions. Breath sounds are clear and equal bilaterally. No wheezes/rales/rhonchi. Gastrointestinal:   Soft and nontender. Non distended. There is no CVA tenderness.  No rebound, rigidity, or guarding. Musculoskeletal:   Normal range of motion in all extremities. No joint effusions.  Tenderness in bilateral calves.  2+ edema bilateral lower extremities, symmetric.  Symmetric calf circumference.. Neurologic:   Normal speech and language.  Motor grossly intact. No acute focal neurologic deficits are appreciated.  Skin:    Skin is warm, dry and intact. No rash noted.  No petechiae, purpura, or bullae.  ____________________________________________    LABS (pertinent positives/negatives) (all labs ordered are listed, but only abnormal results are displayed) Labs Reviewed  BASIC METABOLIC PANEL - Abnormal; Notable for the following components:  Result Value   Potassium 3.3 (*)    Glucose, Bld 126 (*)    BUN <5 (*)    All other components within normal limits  CBC - Abnormal; Notable for the following components:   RDW 14.8 (*)    All other components within normal limits  TROPONIN I  TROPONIN I  POC URINE PREG, ED   ____________________________________________   EKG  Interpreted by me Sinus tachycardia rate 108, right axis, normal intervals.  Normal QRS ST segments and T waves.  There is an S1Q3T3 pattern, but not significantly changed from October 31, 2017.  ____________________________________________    RADIOLOGY  Dg Chest 2 View  Result Date: 06/22/2018 CLINICAL DATA:  Acute chest pain and shortness of breath. EXAM: CHEST - 2 VIEW COMPARISON:  03/29/2018 and prior radiographs FINDINGS: This is a mildly low volume film. The cardiomediastinal silhouette is unremarkable. Mild chronic peribronchial thickening noted. There is no evidence of focal airspace disease, pulmonary edema, suspicious pulmonary nodule/mass, pleural effusion, or pneumothorax. No acute bony abnormalities are identified. IMPRESSION: 1. No evidence of acute cardiopulmonary disease. 2. Mild chronic peribronchial thickening. Electronically Signed   By: Harmon Pier M.D.   On: 06/22/2018 12:50   Ct Angio Chest Pe W And/or Wo Contrast  Result Date: 06/22/2018 CLINICAL DATA:  Progressive shortness of breath EXAM: CT ANGIOGRAPHY CHEST WITH CONTRAST TECHNIQUE: Multidetector CT imaging of the chest was performed using the standard protocol during bolus administration of intravenous contrast. Multiplanar CT image reconstructions and MIPs were obtained to evaluate the vascular anatomy. CONTRAST:  75mL ISOVUE-370 IOPAMIDOL (ISOVUE-370) INJECTION 76% COMPARISON:  Chest x-ray 06/22/2018, CT chest 10/31/2017, 03/20/2017 FINDINGS: Cardiovascular: Suboptimal opacification of the pulmonary arteries to the segmental level which limits evaluation for PE. No definite acute  central embolus is seen. Nonaneurysmal aorta. Borderline to mild cardiomegaly. No pericardial effusion Mediastinum/Nodes: No enlarged mediastinal, hilar, or axillary lymph nodes. Thyroid gland, trachea, and esophagus demonstrate no significant findings. Lungs/Pleura: Lungs are clear. No pleural effusion or pneumothorax. Upper Abdomen: Status post cholecystectomy. No acute abnormality in the upper abdomen Musculoskeletal: No chest wall abnormality. No acute or significant osseous findings. Review of the MIP images confirms the above findings. IMPRESSION: 1. Slightly suboptimal opacification of the pulmonary arterial system which limits the study. No definite acute central PE is seen. 2. Low lung volumes.  No acute pulmonary infiltrate. Electronically Signed   By: Jasmine Pang M.D.   On: 06/22/2018 18:45   US Venous Img Lower Bilateral  Result Date: 06/22/2018 CLINICAL DATA:  Bilateral lower extremity pain and edema for 2 days. EXAM: BILATERAL LOWER EXTREMITY VENOUS DOPPLER ULTRASOUND TECHNIQUE: Gray-scale sonography with graded compression, as well as color Doppler and duplex ultrasound were performed to evaluate the lower extremity deep venous systems from the level of the common femoral vein and including the common femoral, femoral, profunda femoral, popliteal and calf veins including the posterior tibial, peroneal and gastrocnemius veins when visible. The superficial great saphenous vein was also interrogated. Spectral Doppler was utilized to evaluate flow at rest and with distal augmentation maneuvers in the common femoral, femoral and popliteal veins. COMPARISON:  None. FINDINGS: RIGHT LOWER EXTREMITY Common Femoral Vein: No evidence of thrombus. Normal compressibility, respiratory phasicity and response to augmentation. Saphenofemoral Junction: No evidence of thrombus. Normal compressibility and flow on color Doppler imaging. Profunda Femoral Vein: No evidence of thrombus. Normal compressibility and flow  on color Doppler imaging. Femoral Vein: No evidence of thrombus. Normal compressibility, respiratory phasicity and response to augmentation.  Popliteal Vein: No evidence of thrombus. Normal compressibility, respiratory phasicity and response to augmentation. Calf Veins: No evidence of thrombus. Normal compressibility and flow on color Doppler imaging. Superficial Great Saphenous Vein: No evidence of thrombus. Normal compressibility. Venous Reflux:  None. Other Findings:  None. LEFT LOWER EXTREMITY Common Femoral Vein: No evidence of thrombus. Normal compressibility, respiratory phasicity and response to augmentation. Saphenofemoral Junction: No evidence of thrombus. Normal compressibility and flow on color Doppler imaging. Profunda Femoral Vein: No evidence of thrombus. Normal compressibility and flow on color Doppler imaging. Femoral Vein: No evidence of thrombus. Normal compressibility, respiratory phasicity and response to augmentation. Popliteal Vein: No evidence of thrombus. Normal compressibility, respiratory phasicity and response to augmentation. Calf Veins: No evidence of thrombus. Normal compressibility and flow on color Doppler imaging. Superficial Great Saphenous Vein: No evidence of thrombus. Normal compressibility. Venous Reflux:  None. Other Findings:  None. IMPRESSION: No evidence of deep venous thrombosis in either lower extremity. Electronically Signed   By: Delbert Phenix M.D.   On: 06/22/2018 17:12    ____________________________________________   PROCEDURES Procedures  ____________________________________________  DIFFERENTIAL DIAGNOSIS   Non-STEMI, pulmonary embolism, pulmonary edema, peripheral edema, DVT  CLINICAL IMPRESSION / ASSESSMENT AND PLAN / ED COURSE  Pertinent labs & imaging results that were available during my care of the patient were reviewed by me and considered in my medical decision making (see chart for details).    Patient presents with atypical chest pain as  well as bilateral leg pain worse in the feet with worsening of her usual swelling.  The chest pain is not consistent with unstable angina.  We will check 2 troponins to evaluate for non-STEMI.  Chest x-ray is nondiagnostic so I will obtain a CT chest to evaluate for PE versus occult edema or pneumonia due to her EKG suggestive of right heart strain.  Clinical Course as of Jun 22 2153  Sat Jun 22, 2018  1732 [place] negative for DVT.   US Venous Img Lower Bilateral [PS]  1819 [place] PIV placed by me.    [PS]  2022 CT chest negative for acute PE.  They note some technical limitation of the study, but I think it adequately assesses the possibility of a hemodynamically significant lesion   [PS]  2153 Repeat trop negative.    [PS]    Clinical Course User Index [PS] Sharman Cheek, MD     ----------------------------------------- 10:04 PM on 06/22/2018 -----------------------------------------  Patient feeling better, mild pain, relatively comfortable.  She does note that she is recently been running out of her Lasix, only has 1 pill left so she has been spacing it out at the same time that her swelling is been worsening.  She also drinks a lot of soda and does not follow a low-salt diet.  Her work-up is reassuring without identifying any acute pathology, and her history suggests exacerbation of her underlying heart failure due to medical noncompliance.  I will refill her Lasix, advised heart failure clinic follow-up in 2 days.  Increase Lasix to twice daily over the next few days pending follow-up.  Return if worsening.  ____________________________________________   FINAL CLINICAL IMPRESSION(S) / ED DIAGNOSES    Final diagnoses:  Nonspecific chest pain     ED Discharge Orders    None      Portions of this note were generated with dragon dictation software. Dictation errors may occur despite best attempts at proofreading.    Sharman Cheek, MD 06/22/18 2207

## 2018-06-22 NOTE — Discharge Instructions (Signed)
Your lab tests and CT scan of the chest were okay today. Follow up with your doctor and the heart failure clinic for continued monitoring of your symptoms. Return to the ER if you have any worsening of your symptoms.

## 2018-06-22 NOTE — ED Notes (Signed)
Attempted to draw blood on pt, Pt is difficult stick, unable to obtain lab sample. Lab called to come draw blood from patient.

## 2018-06-27 ENCOUNTER — Ambulatory Visit: Payer: Self-pay | Admitting: Family

## 2018-06-27 ENCOUNTER — Telehealth: Payer: Self-pay | Admitting: Family

## 2018-06-27 ENCOUNTER — Encounter: Payer: Self-pay | Admitting: Dietician

## 2018-06-27 NOTE — Telephone Encounter (Signed)
Patient did not show for his Heart Failure Clinic appointment on 06/27/18. Will attempt to reschedule.  

## 2018-06-29 ENCOUNTER — Emergency Department
Admission: EM | Admit: 2018-06-29 | Discharge: 2018-06-29 | Disposition: A | Discharge status: Home / Self Care | Payer: BLUE CROSS/BLUE SHIELD | Attending: Emergency Medicine | Admitting: Emergency Medicine

## 2018-06-29 ENCOUNTER — Encounter: Payer: Self-pay | Admitting: Emergency Medicine

## 2018-06-29 ENCOUNTER — Emergency Department: Payer: BLUE CROSS/BLUE SHIELD

## 2018-06-29 ENCOUNTER — Other Ambulatory Visit: Payer: Self-pay

## 2018-06-29 DIAGNOSIS — R600 Localized edema: Secondary | ICD-10-CM | POA: Insufficient documentation

## 2018-06-29 DIAGNOSIS — I2510 Atherosclerotic heart disease of native coronary artery without angina pectoris: Secondary | ICD-10-CM | POA: Diagnosis not present

## 2018-06-29 DIAGNOSIS — Z7984 Long term (current) use of oral hypoglycemic drugs: Secondary | ICD-10-CM | POA: Diagnosis not present

## 2018-06-29 DIAGNOSIS — R0789 Other chest pain: Secondary | ICD-10-CM | POA: Insufficient documentation

## 2018-06-29 DIAGNOSIS — E119 Type 2 diabetes mellitus without complications: Secondary | ICD-10-CM | POA: Insufficient documentation

## 2018-06-29 DIAGNOSIS — J45909 Unspecified asthma, uncomplicated: Secondary | ICD-10-CM | POA: Insufficient documentation

## 2018-06-29 DIAGNOSIS — I10 Essential (primary) hypertension: Secondary | ICD-10-CM | POA: Insufficient documentation

## 2018-06-29 DIAGNOSIS — Z79899 Other long term (current) drug therapy: Secondary | ICD-10-CM | POA: Insufficient documentation

## 2018-06-29 LAB — COMPREHENSIVE METABOLIC PANEL
ALT: 13 U/L (ref 0–44)
AST: 17 U/L (ref 15–41)
Albumin: 4.5 g/dL (ref 3.5–5.0)
Alkaline Phosphatase: 70 U/L (ref 38–126)
Anion gap: 8 (ref 5–15)
BUN: 10 mg/dL (ref 6–20)
CO2: 28 mmol/L (ref 22–32)
Calcium: 9.8 mg/dL (ref 8.9–10.3)
Chloride: 105 mmol/L (ref 98–111)
Creatinine, Ser: 0.85 mg/dL (ref 0.44–1.00)
GFR calc Af Amer: >60 mL/min (ref >60)
GFR calc non Af Amer: >60 mL/min (ref >60)
Glucose, Bld: 102 mg/dL — ABNORMAL HIGH (ref 70–99)
Potassium: 4.1 mmol/L (ref 3.5–5.1)
Sodium: 141 mmol/L (ref 135–145)
Total Bilirubin: 0.6 mg/dL (ref 0.3–1.2)
Total Protein: 8.2 g/dL — ABNORMAL HIGH (ref 6.5–8.1)

## 2018-06-29 LAB — CBC
HCT: 41.2 % (ref 35.0–47.0)
Hemoglobin: 13.6 g/dL (ref 12.0–16.0)
MCH: 27.3 pg (ref 26.0–34.0)
MCHC: 33 g/dL (ref 32.0–36.0)
MCV: 82.6 fL (ref 80.0–100.0)
Platelets: 256 10*3/uL (ref 150–440)
RBC: 4.99 MIL/uL (ref 3.80–5.20)
RDW: 14.1 % (ref 11.5–14.5)
WBC: 4.3 10*3/uL (ref 3.6–11.0)

## 2018-06-29 LAB — LIPASE, BLOOD: Lipase: 23 U/L (ref 11–51)

## 2018-06-29 LAB — BRAIN NATRIURETIC PEPTIDE: B Natriuretic Peptide: 49 pg/mL (ref 0.0–100.0)

## 2018-06-29 LAB — TROPONIN I: Troponin I: <0.03 ng/mL (ref <0.03)

## 2018-06-29 NOTE — Discharge Instructions (Addendum)
Continue taking her medications, take Lasix, try Tylenol for the discomfort if you are not allergic, take it as directed.  Return to the emergency room for any new or worrisome symptoms.

## 2018-06-29 NOTE — ED Provider Notes (Signed)
Angiocath insertion Performed by: Merrily Brittle  Consent: Verbal consent obtained. Risks and benefits: risks, benefits and alternatives were discussed Time out: Immediately prior to procedure a "time out" was called to verify the correct patient, procedure, equipment, support staff and site/side marked as required.  Preparation: Patient was prepped and draped in the usual sterile fashion.  Vein Location: right forearm  Ultrasound Guided  Gauge: 20  Normal blood return and flush without difficulty Patient tolerance: Patient tolerated the procedure well with no immediate complications.      Merrily Brittle, MD 06/29/18 [id number]

## 2018-06-29 NOTE — ED Notes (Signed)
Pt transported to radiology.

## 2018-06-29 NOTE — ED Triage Notes (Signed)
Intermittent chest pain x 2 weeks. Bilateral pedal edema.

## 2018-06-29 NOTE — ED Provider Notes (Signed)
[facility] Provider Note  ____________________________________________   I have reviewed the triage vital signs and the nursing notes. Where available I have reviewed prior notes and, if possible and indicated, outside hospital notes.    HISTORY  Chief Complaint Chest Pain    HPI Andrea Miller is a 32 y.o. female history of tachycardia possibly SVT in the past with extensive cardiac work-up including multiple different echoes and cardiology referrals, history of chronic lower extremity edema, morbid obesity, chronic chest pain of noncardiac origin, HTN, Type II DM, anxiety, chronic pain, migraines, pseudoseizures, fibromyalgia and sleep apnea presents here with chest pain.  Patient was last seen here with chest pain on the 27th of last month, about 6 days ago, at which time she had negative DVT scan is negative cardiac enzymes negative CT scan of her chest, she has had a low negative CT scans of her chest for her chest pain in the past , at least for total, as well as other facilities, for chest pain like this.  She has had stress echoes and her most recent echo showed no evidence of CHF with a normal EF, she states the pain went away for a day or 2 and now is been back again for 4 days.  She also states her old swollen legs are bothering her.  She states she is taking her Lasix . patient states that the pain is        Location: Left side Radiation: None Quality: Ache Duration: 4 days Timing: Nonstop Severity: Moderate Associated sxs: Chronic leg swelling PriorTreatment : None   Past Medical History:  Diagnosis Date  . Anxiety   . Asthma   . Bipolar disorder (HCC)   . Chest pain    and angina  . Depression   . Fibromyalgia   . Hypertension   . 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 Obesity   . Pre-diabetes   . Seizures (HCC)    secondary to anxiety  last seizure 01/25/18  . SVT  (supraventricular tachycardia) (HCC)    with hx of syncope; managed by [facility]    Patient Active Problem List   Diagnosis Date Noted  . Chronic cholecystitis   . Edema 02/13/2018  . Diabetes mellitus type 2, uncomplicated (HCC) 02/13/2018  . Hypertension 11/23/2017  . Asthma exacerbation 04/14/2017  . Asthma without status asthmaticus 01/16/2017  . Bipolar affective (HCC) 01/16/2017  . Coronary artery disease 01/16/2017  . Syncope 12/12/2016  . Fibromyalgia 04/27/2014  . OSA (obstructive sleep apnea) 04/27/2014  . Seizure-like activity (HCC) 04/27/2014  . Migraine without status migrainosus, not intractable 04/27/2014  . Obesity 04/15/2014  . Sleep disorder 04/15/2014  . Neck pain 04/15/2014  . Headache 04/15/2014  . Restless legs syndrome 04/15/2014  . Supraventricular tachycardia (HCC) 07/13/2009    Past Surgical History:  Procedure Laterality Date  . CHOLECYSTECTOMY N/A 02/22/2018   Procedure: LAPAROSCOPIC CHOLECYSTECTOMY;  Surgeon: Ancil Linsey, MD;  Location: [facility] ORS;  Service: General;  Laterality: N/A;  . TONSILLECTOMY AND ADENOIDECTOMY N/A 09/05/2016   Procedure: TONSILLECTOMY AND ADENOIDECTOMY;  Surgeon: Linus Salmons, MD;  Location: [facility] ORS;  Service: ENT;  Laterality: N/A;    Prior to Admission medications   Medication Sig Start Date End Date Taking? Authorizing Provider  albuterol (PROVENTIL HFA;VENTOLIN HFA) 108 (90 Base) MCG/ACT inhaler Inhale 2 puffs into the lungs every 6 (six) hours as needed for wheezing or shortness of breath. 01/31/18  Merwyn Katos, MD  albuterol (PROVENTIL) (2.5 MG/3ML) 0.083% nebulizer solution Take 2.5 mg by nebulization every 4 (four) hours as needed for wheezing or shortness of breath.    [provider]  budesonide-formoterol (SYMBICORT) 160-4.5 MCG/ACT inhaler Inhale 2 puffs into the lungs 2 (two) times daily. Patient not taking: Reported on 05/06/2018 01/31/18   Merwyn Katos, MD  Calcium-Magnesium 500-250 MG  TABS Take by mouth.    [provider]  Cariprazine HCl (VRAYLAR) 6 MG CAPS Take 6 mg by mouth daily.    [provider]  clonazePAM (KLONOPIN) 2 MG tablet Take 2-4 mg by mouth See admin instructions. Take 2 mgs in the morning and 4 mg at bedtime    [provider]  cyclobenzaprine (FLEXERIL) 10 MG tablet Take 1 tablet (10 mg total) by mouth 3 (three) times daily as needed. 03/29/18   Joni Reining, PA-C  Dexlansoprazole (DEXILANT) 30 MG capsule Take 30 mg by mouth daily.    [provider]  diltiazem (CARDIZEM CD) 360 MG 24 hr capsule Take 360 mg by mouth daily.     [provider]  docusate sodium (COLACE) 100 MG capsule Take 100 mg by mouth 2 (two) times daily.    [provider]  escitalopram (LEXAPRO) 20 MG tablet Take 20 mg by mouth daily.    [provider]  fluticasone (FLONASE) 50 MCG/ACT nasal spray Place 2 sprays into both nostrils daily.    [provider]  folic acid (FOLVITE) 1 MG tablet Take 1 mg by mouth daily.  12/15/16   [provider]  furosemide (LASIX) 40 MG tablet Take 0.5 tablets (20 mg total) by mouth 2 (two) times daily. Feet swelling 06/22/18   Sharman Cheek, MD  gabapentin (NEURONTIN) 300 MG capsule Take 300 mg by mouth 3 (three) times daily.    [provider]  ibuprofen (ADVIL,MOTRIN) 600 MG tablet Take 1 tablet (600 mg total) by mouth every 6 (six) hours as needed. Patient taking differently: Take 600 mg by mouth every 6 (six) hours as needed for moderate pain.  12/22/16   Joni Reining, PA-C  ketorolac (TORADOL) 10 MG tablet Take 1 tablet (10 mg total) by mouth every 6 (six) hours as needed. Patient not taking: Reported on 05/06/2018 03/29/18   Joni Reining, PA-C  linaclotide [facility]) 290 MCG CAPS capsule Take 290 mcg by mouth daily before breakfast.    [provider]  montelukast (SINGULAIR) 10 MG tablet Take 1 tablet (10 mg total) by mouth at bedtime. 01/31/18  01/31/19  Merwyn Katos, MD  omeprazole (PRILOSEC) 20 MG capsule Take 1 capsule (20 mg total) by mouth 2 (two) times daily. 05/10/18 05/10/19  Loleta Rose, MD  oxyCODONE-acetaminophen (PERCOCET/ROXICET) 5-325 MG tablet Take 1 tablet by mouth every 6 (six) hours as needed for severe pain. Patient not taking: Reported on 05/06/2018 02/28/18   Ancil Linsey, MD  potassium chloride (K-DUR) 10 MEQ tablet Take 10 meq once daily then the next day take 10 meq twice daily    [provider]  predniSONE (DELTASONE) 10 MG tablet Take 6 tabs (60 mg) PO daily x 4 days, then take 4 tabs (40 mg) PO daily x 4 days, then take 2 tabs (20 mg) PO daily x 4 days, then take 1 tab (10 mg) PO daily x 4 days, then take 1/2 tab (5 mg) PO daily x 4 days. 05/10/18   Loleta Rose, MD  pregabalin (LYRICA) 100  MG capsule Take 100 mg by mouth 2 (two) times daily.    [provider]  promethazine (PHENERGAN) 12.5 MG tablet Take 2 tablets (25 mg total) by mouth every 6 (six) hours as needed for nausea or vomiting. Patient not taking: Reported on 05/06/2018 02/20/18   Governor Rooks, MD  propranolol (INDERAL) 40 MG tablet Take 40 mg by mouth 3 (three) times daily.    [provider]  risperiDONE (RISPERDAL) 1 MG tablet Take 1 mg by mouth daily.    [provider]  rosuvastatin (CRESTOR) 20 MG tablet Take 20 mg by mouth daily.    [provider]  tiZANidine (ZANAFLEX) 4 MG tablet Take 4 mg by mouth 3 (three) times daily.  03/26/17   [provider]  topiramate (TOPAMAX) 50 MG tablet Take 100 mg by mouth 2 (two) times daily.    [provider]  Topiramate ER (TROKENDI XR) 200 MG CP24 Take by mouth.    [provider]  traMADol (ULTRAM) 50 MG tablet Take 1 tablet (50 mg total) by mouth every 6 (six) hours as needed. Patient not taking: Reported on 05/06/2018 03/29/18   Joni Reining, PA-C  VYVANSE 70 MG capsule Take 70 mg by mouth daily.  02/19/18   [provider]    Allergies Cortizone-10 [hydrocortisone]; Garlic; and Corticosteroids  Family History  Problem Relation Age of Onset  . Diabetes Mother   . Hypertension Mother   . Asthma Mother   . Gallstones Mother   . Diabetes Father   . Hypertension Father   . Gallstones Father     Social History Social History   Tobacco Use  . Smoking status: Never Smoker  . Smokeless tobacco: Never Used  Substance Use Topics  . Alcohol use: No  . Drug use: No    Review of Systems Constitutional: No fever/chills Eyes: No visual changes. ENT: No sore throat. No stiff neck no neck pain Cardiovascular: Denies chest pain. Respiratory: Denies shortness of breath. Gastrointestinal:   no vomiting.  No diarrhea.  No constipation. Genitourinary: Negative for dysuria. Musculoskeletal: Negative lower extremity swelling Skin: Negative for rash. Neurological: Negative for severe headaches, focal weakness or numbness.   ____________________________________________   PHYSICAL EXAM:  VITAL SIGNS: ED Triage Vitals  Enc Vitals Group     BP 06/29/18 0741 138/87     Pulse Rate 06/29/18 0741 74     Resp 06/29/18 0741 20     Temp 06/29/18 0741 98.7 F (37.1 C)     Temp Source 06/29/18 0741 Oral     SpO2 06/29/18 0741 97 %     Weight 06/29/18 0742 (!) 370 lb (167.8 kg)     Height 06/29/18 0742 5\' 10"  (1.778 m)     Head Circumference --      Peak Flow --      Pain Score 06/29/18 0741 10     Pain Loc --      Pain Edu? --      Excl. in GC? --     Constitutional: Alert and oriented. Well appearing and in no acute distress. Eyes: Conjunctivae are normal Head: Atraumatic HEENT: No congestion/rhinnorhea. Mucous membranes are moist.  Oropharynx non-erythematous Neck:   Nontender with no meningismus, no masses, no stridor Cardiovascular: Normal rate, regular rhythm. Grossly normal heart sounds.  Good peripheral circulation. Respiratory: Normal respiratory effort.  No retractions. Lungs  CTAB. Chest: Tender palpation left chest wall.  Patient states "that the pain right there" and  pulls back.  No crepitus, no evidence of fracture or infection noted to exam Abdominal: Soft and nontender. No distention. No guarding no rebound Back:  There is no focal tenderness or step off.  there is no midline tenderness there are no lesions noted. there is no CVA tenderness Musculoskeletal: No lower extremity tenderness, no upper extremity tenderness. No joint effusions, no DVT signs strong distal pulses bilateral symmetric edema Neurologic:  Normal speech and language. No gross focal neurologic deficits are appreciated.  Skin:  Skin is warm, dry and intact. No rash noted. Psychiatric: Mood and affect are excess. Speech and behavior are normal.  ____________________________________________   LABS (all labs ordered are listed, but only abnormal results are displayed)  Labs Reviewed  CBC  TROPONIN I  COMPREHENSIVE METABOLIC PANEL  BRAIN NATRIURETIC PEPTIDE  LIPASE, BLOOD  POC URINE PREG, ED    Pertinent labs  results that were available during my care of the patient were reviewed by me and considered in my medical decision making (see chart for details). ____________________________________________  EKG  I personally interpreted any EKGs ordered by me or triage Sinus rhythm, rate 72 bpm no acute ST elevation or depression ossific ST changes no acute ischemia ____________________________________________  RADIOLOGY  Pertinent labs & imaging results that were available during my care of the patient were reviewed by me and considered in my medical decision making (see chart for details). If possible, patient and/or family made aware of any abnormal findings.  No results found. ____________________________________________    PROCEDURES  Procedure(s) performed: None  Procedures  Critical Care performed: None  ____________________________________________   INITIAL IMPRESSION  / ASSESSMENT AND PLAN / ED COURSE  Pertinent labs & imaging results that were available during my care of the patient were reviewed by me and considered in my medical decision making (see chart for details).  Patient with chronic pain syndromes and multiple different visits for atypical chest pain which is been exhaustively worked up most recently last week, and also by multiple different cardiologists in multiple different hospitals presents with chest pain.  Been going on since Wednesday.  Think she has a PE.  Do not think she has a dissection.  Do not think she has myocarditis endocarditis.  I do not think she has pericarditis pneumonia pneumothorax pulmonary abscess.  I do not think she has Boerhaave's I do not think she has aortic aneurysm or pericardial tamponade. does have history of pneumonia but not reporting cough, will get a chest x-ray, which will be her 26th chest x-ray at this facility from the emergency department primarily, random sampling of which show no evidence of discovered pathology, we will send a second set of cardiac enzymes although low suspicion for ACS, I do not think serial enzymes are indicated, BNP will be checked.  Vital signs are reassuring, is my hope that he get the patient safely home if work-up is unrevealing.Marland Kitchen  ----------------------------------------- 9:49 AM on 06/29/2018 -----------------------------------------  Time work-up is negative, patient declines to give urine sample at this time, she has been having chest pain for years and this is very reproducible, exhaustive work-up today and over the week antecedent to this show no evidence of acute pathology.  At this time, there does not appear to be clinical evidence to support the diagnosis of pulmonary embolus, dissection, myocarditis, endocarditis, pericarditis, pericardial tamponade, acute coronary syndrome, pneumothorax, pneumonia, or any other acute intrathoracic pathology that will require admission or acute  intervention. Nor is there evidence of any significant intra-abdominal  pathology causing this discomfort.  Abdomen is benign.  We will discharge.  Patient does have chronic lower extremity edema I have encouraged her to wear compression stockings and still take her Lasix.    ____________________________________________   FINAL CLINICAL IMPRESSION(S) / ED DIAGNOSES  Final diagnoses:  None      This chart was dictated using voice recognition software.  Despite best efforts to proofread,  errors can occur which can change meaning.      Jeanmarie Plant, MD 06/29/18 [phone]

## 2018-06-29 NOTE — ED Notes (Signed)
Pt returned from radiology.

## 2018-07-01 ENCOUNTER — Encounter: Payer: Self-pay | Admitting: Family

## 2018-07-01 ENCOUNTER — Ambulatory Visit: Payer: BLUE CROSS/BLUE SHIELD | Attending: Family | Admitting: Family

## 2018-07-01 VITALS — BP 132/87 | HR 61 | Resp 18 | Ht 70.0 in | Wt 381.4 lb

## 2018-07-01 DIAGNOSIS — I110 Hypertensive heart disease with heart failure: Secondary | ICD-10-CM | POA: Insufficient documentation

## 2018-07-01 DIAGNOSIS — R569 Unspecified convulsions: Secondary | ICD-10-CM | POA: Insufficient documentation

## 2018-07-01 DIAGNOSIS — Z8249 Family history of ischemic heart disease and other diseases of the circulatory system: Secondary | ICD-10-CM | POA: Insufficient documentation

## 2018-07-01 DIAGNOSIS — Z6841 Body Mass Index (BMI) 40.0 and over, adult: Secondary | ICD-10-CM | POA: Insufficient documentation

## 2018-07-01 DIAGNOSIS — M797 Fibromyalgia: Secondary | ICD-10-CM | POA: Diagnosis not present

## 2018-07-01 DIAGNOSIS — Z825 Family history of asthma and other chronic lower respiratory diseases: Secondary | ICD-10-CM | POA: Diagnosis not present

## 2018-07-01 DIAGNOSIS — I890 Lymphedema, not elsewhere classified: Secondary | ICD-10-CM | POA: Insufficient documentation

## 2018-07-01 DIAGNOSIS — F419 Anxiety disorder, unspecified: Secondary | ICD-10-CM | POA: Diagnosis not present

## 2018-07-01 DIAGNOSIS — Z8379 Family history of other diseases of the digestive system: Secondary | ICD-10-CM | POA: Diagnosis not present

## 2018-07-01 DIAGNOSIS — F319 Bipolar disorder, unspecified: Secondary | ICD-10-CM | POA: Insufficient documentation

## 2018-07-01 DIAGNOSIS — Z7951 Long term (current) use of inhaled steroids: Secondary | ICD-10-CM | POA: Insufficient documentation

## 2018-07-01 DIAGNOSIS — E669 Obesity, unspecified: Secondary | ICD-10-CM | POA: Insufficient documentation

## 2018-07-01 DIAGNOSIS — R7303 Prediabetes: Secondary | ICD-10-CM | POA: Insufficient documentation

## 2018-07-01 DIAGNOSIS — Z833 Family history of diabetes mellitus: Secondary | ICD-10-CM | POA: Insufficient documentation

## 2018-07-01 DIAGNOSIS — Z888 Allergy status to other drugs, medicaments and biological substances status: Secondary | ICD-10-CM | POA: Insufficient documentation

## 2018-07-01 DIAGNOSIS — I10 Essential (primary) hypertension: Secondary | ICD-10-CM

## 2018-07-01 DIAGNOSIS — J45909 Unspecified asthma, uncomplicated: Secondary | ICD-10-CM | POA: Insufficient documentation

## 2018-07-01 DIAGNOSIS — G4733 Obstructive sleep apnea (adult) (pediatric): Secondary | ICD-10-CM

## 2018-07-01 DIAGNOSIS — Z9049 Acquired absence of other specified parts of digestive tract: Secondary | ICD-10-CM | POA: Diagnosis not present

## 2018-07-01 DIAGNOSIS — Z79899 Other long term (current) drug therapy: Secondary | ICD-10-CM | POA: Insufficient documentation

## 2018-07-01 DIAGNOSIS — I509 Heart failure, unspecified: Secondary | ICD-10-CM | POA: Diagnosis present

## 2018-07-01 DIAGNOSIS — I5032 Chronic diastolic (congestive) heart failure: Secondary | ICD-10-CM | POA: Diagnosis not present

## 2018-07-01 NOTE — Patient Instructions (Addendum)
Begin weighing daily and call for an overnight weight gain of > 2 pounds or a weekly weight gain of >5 pounds.  Increase furosemide to 2 tablets twice daily (40mg  each time) and increase potassium to 2 tablets daily.

## 2018-07-01 NOTE — Progress Notes (Signed)
Subjective:    Patient ID: Andrea Miller, female    DOB: 31-Jul-1986, 32 y.o.   MRN: [id number]  HPI  Andrea Miller is a 32 y/o female with a history of asthma, HTN, SVT, obesity, bipolar, anxiety, depression, fibromyalgia, seizures, bulging disc and chronic heart failure.   Echo report from 04/15/17 reviewed and showed an EF of 60-65%.  Was in the ED 06/29/18 due to chest pain where she was treated and released after work-up was negative. Was in the ED 06/22/18 due to chest pain. [place] negative for DVT. Chest CT negative for PE. Released same day.   She presents today for her initial visit with a chief complaint of moderate shortness of breath upon minimal exertion. She describes this as chronic in nature having been present for many years. Says that she's been seeing a cardiologist ever since she was a child due to palpitations. She has associated fatigue, chest pain, pedal edema, palpitations, abdominal distention, back pain, dizziness, numbness down the leg (due to disc) and depression along with this. She denies a cough or difficulty sleeping. Says that she feels quite sleepy this morning.   Past Medical History:  Diagnosis Date  . Anxiety   . Asthma   . Bipolar disorder (HCC)   . Chest pain    and angina  . CHF (congestive heart failure) (HCC)   . Depression   . Fibromyalgia   . Hypertension   . 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 Obesity   . Pre-diabetes   . Seizures (HCC)    secondary to anxiety  last seizure 01/25/18  . SVT (supraventricular tachycardia) (HCC)    with hx of syncope; managed by [facility]   Past Surgical History:  Procedure Laterality Date  . CHOLECYSTECTOMY N/A 02/22/2018   Procedure: LAPAROSCOPIC CHOLECYSTECTOMY;  Surgeon: Ancil Linsey, MD;  Location: [facility] ORS;  Service: General;  Laterality: N/A;  . TONSILLECTOMY AND ADENOIDECTOMY N/A 09/05/2016   Procedure: TONSILLECTOMY AND ADENOIDECTOMY;  Surgeon: Linus Salmons, MD;   Location: [facility] ORS;  Service: ENT;  Laterality: N/A;   Family History  Problem Relation Age of Onset  . Diabetes Mother   . Hypertension Mother   . Asthma Mother   . Gallstones Mother   . Diabetes Father   . Hypertension Father   . Gallstones Father    Social History   Tobacco Use  . Smoking status: Never Smoker  . Smokeless tobacco: Never Used  Substance Use Topics  . Alcohol use: No   Allergies  Allergen Reactions  . Cortizone-10 [Hydrocortisone] Shortness Of Breath and Swelling  . Garlic Anaphylaxis  . Prednisone Swelling    Tongue swelling  . Corticosteroids Palpitations   Prior to Admission medications   Medication Sig Start Date End Date Taking? Authorizing Provider  albuterol (PROVENTIL HFA;VENTOLIN HFA) 108 (90 Base) MCG/ACT inhaler Inhale 2 puffs into the lungs every 6 (six) hours as needed for wheezing or shortness of breath. 01/31/18  Yes Merwyn Katos, MD  albuterol (PROVENTIL) (2.5 MG/3ML) 0.083% nebulizer solution Take 2.5 mg by nebulization every 4 (four) hours as needed for wheezing or shortness of breath.   Yes [provider]  budesonide-formoterol (SYMBICORT) 160-4.5 MCG/ACT inhaler Inhale 2 puffs into the lungs 2 (two) times daily. 01/31/18  Yes Merwyn Katos, MD  Calcium-Magnesium 500-250 MG TABS Take by mouth.   Yes [provider]  clonazePAM (KLONOPIN) 2 MG tablet Take 2-4 mg by mouth  See admin instructions. Take 2 mgs in the morning and 4 mg at bedtime   Yes [provider]  Dexlansoprazole 30 MG capsule Take 30 mg by mouth daily.   Yes [provider]  diltiazem (CARDIZEM CD) 360 MG 24 hr capsule Take 360 mg by mouth daily.    Yes [provider]  fluticasone (FLONASE) 50 MCG/ACT nasal spray Place 2 sprays into both nostrils daily.   Yes [provider]  folic acid (FOLVITE) 1 MG tablet Take 1 mg by mouth daily.  12/15/16  Yes [provider]  linaclotide (LINZESS) 290 MCG CAPS capsule  Take 290 mcg by mouth daily before breakfast.   Yes [provider]  montelukast (SINGULAIR) 10 MG tablet Take 1 tablet (10 mg total) by mouth at bedtime. 01/31/18 01/31/19 Yes Merwyn Katos, MD  potassium chloride (K-DUR) 10 MEQ tablet Take 20 mEq by mouth once. Take 10 meq once daily then the next day take 10 meq twice daily   Yes [provider]  pregabalin (LYRICA) 100 MG capsule Take 100 mg by mouth 2 (two) times daily.   Yes [provider]  promethazine (PHENERGAN) 12.5 MG tablet Take 2 tablets (25 mg total) by mouth every 6 (six) hours as needed for nausea or vomiting. 02/20/18  Yes Governor Rooks, MD  propranolol (INDERAL) 40 MG tablet Take 80 mg by mouth 3 (three) times daily.    Yes [provider]  risperiDONE (RISPERDAL) 1 MG tablet Take 2 mg by mouth daily.    Yes [provider]  rosuvastatin (CRESTOR) 20 MG tablet Take 20 mg by mouth daily.   Yes [provider]  tiZANidine (ZANAFLEX) 4 MG tablet Take 4 mg by mouth 3 (three) times daily.  03/26/17  Yes [provider]  Topiramate ER (TROKENDI XR) 200 MG CP24 Take by mouth.   Yes [provider]  VYVANSE 70 MG capsule Take 70 mg by mouth daily.  02/19/18  Yes [provider]  escitalopram (LEXAPRO) 20 MG tablet Take 20 mg by mouth daily.    [provider]    Review of Systems  Constitutional: Positive for fatigue. Negative for appetite change.  HENT: Negative for congestion, postnasal drip and sore throat.   Eyes: Negative.   Respiratory: Positive for shortness of breath. Negative for cough.   Cardiovascular: Positive for chest pain (intermittent), palpitations (at times) and leg swelling.  Gastrointestinal: Positive for abdominal distention. Negative for abdominal pain.  Endocrine: Negative.   Genitourinary: Negative.   Musculoskeletal: Positive for arthralgias (leg pain) and back pain (due to bulging disc).  Skin: Negative.    Allergic/Immunologic: Negative.   Neurological: Positive for dizziness, light-headedness and numbness (down leg).  Hematological: Negative for adenopathy. Does not bruise/bleed easily.  Psychiatric/Behavioral: Positive for dysphoric mood. Negative for sleep disturbance (wearing CPAP at night). The patient is not nervous/anxious.       Vitals:   07/01/18 1037  BP: 132/87  Pulse: 61  Resp: 18  SpO2: 100%  Weight: (!) 381 lb 6 oz (173 kg)  Height: 5\' 10"  (1.778 m)   Wt Readings from Last 3 Encounters:  07/01/18 (!) 381 lb 6 oz (173 kg)  06/29/18 (!) 370 lb (167.8 kg)  06/22/18 (!) 370 lb (167.8 kg)   Lab Results  Component Value Date   CREATININE 0.85 06/29/2018   CREATININE 0.72 06/22/2018   CREATININE 0.71 02/20/2018    Objective:   Physical Exam  Constitutional: She is oriented  to person, place, and time. She appears well-developed and well-nourished.  HENT:  Head: Normocephalic and atraumatic.  Neck: Normal range of motion. Neck supple. No JVD present.  Cardiovascular: Normal rate and regular rhythm.  Pulmonary/Chest: Effort normal. No respiratory distress. She has no wheezes. She has no rales.  Abdominal: Soft. She exhibits no distension.  Musculoskeletal:       Right lower leg: She exhibits tenderness and edema (2+ pitting).       Left lower leg: She exhibits tenderness and edema (2+ pitting).  Neurological: She is alert and oriented to person, place, and time.  Skin: Skin is warm and dry.  Psychiatric: She has a normal mood and affect. Her behavior is normal.  Nursing note and vitals reviewed.     Assessment & Plan:   1: Chronic heart failure with preserved ejection fraction- - NYHA class III - mild fluid overloaded today - increase furosemide to 40mg  BID along with potassium to 2 tablets daily - not weighing daily but says that she has some scales. Explained the importance of weighing daily and to call for an overnight weight gain of >2 pounds or a weekly  weight gain of >5 pounds - not adding salt and occasionally reads food labels; reviewed the importance of closely following a 2000mg  sodium diet and written dietary information was given to her about this - sees cardiology Welton Flakes) 07/04/18 and thinks she is getting echo done that day - drinking ~ 64 ounces of fluid daily - saw pulmonology Sung Amabile) 01/31/18 - BNP 06/29/18 was 49.0 - will check a BMP at her next visit if not done elsewhere  2: HTN- - BP looks good today - saw PCP Letta Pate) at [facility] last week - BMP 06/29/18 reviewed and showed sodium 141, potassium 4.1 and GFR >60  3: Obstructive sleep apnea- - wearing CPAP nightly and feels like she's sleeping well  4: Lymphedema- - stage 2 - elevating legs some during the day - limited in her ability to exercise due to disc in her back causing pain - instructed to get compression socks and wear them daily with removal at bedtime; could also use ACE wraps to wrap the legs if unable to get the socks on - consider lymphapress compression boots if symptoms persist  Patient did not bring her medications nor a list. Each medication was verbally reviewed with the patient and she was encouraged to bring the bottles to every visit to confirm accuracy of list.  Return in 6 weeks or sooner for any questions/problems before then.

## 2018-07-19 ENCOUNTER — Encounter: Payer: Self-pay | Admitting: Family Medicine

## 2018-07-19 ENCOUNTER — Encounter: Payer: Self-pay | Admitting: *Deleted

## 2018-07-22 ENCOUNTER — Encounter: Payer: BLUE CROSS/BLUE SHIELD | Attending: Nurse Practitioner | Admitting: Dietician

## 2018-07-22 ENCOUNTER — Encounter: Payer: Self-pay | Admitting: Dietician

## 2018-07-22 DIAGNOSIS — I519 Heart disease, unspecified: Secondary | ICD-10-CM

## 2018-07-22 DIAGNOSIS — R7303 Prediabetes: Secondary | ICD-10-CM

## 2018-07-22 NOTE — Progress Notes (Signed)
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 She states she plans to attend a bariatric surgery information session this week and is serious about pursuing this option. She stated that she has followed through to increase her water intake and decrease juice and fruit punch. She states she is also eating breakfast consisting of a boiled egg, milk, cheese or cereal. She states her sister moved out today and she had been cooking her lunch and dinner meals such as baked chicken, broccoli and mashed potatoes.  When I started reviewing meal plan instructed on at initial visit, Andrea Miller stated that she has not been able to keep down solid food since last Thursday (3-4 days ago) and was told to drink Gaterade. When asked, she states she is vomiting 10-15 times /day and states she will be seeing a GI doctor tomorrow. At end of visit, she stated she can "keep down" milk and yogurt. Weight loss of 1.8 lbs since previous visit 2 months ago. Patient reports she had lost 10 lbs prior to swelling in ankles.  Physical activity: States she has tried to walk for exercise and then stated that her doctor has advised against exercise.    Nutrition Care Education: Discussed possible beverages and foods to include until nausea improves or resolves. Encouraged to resume previous goals once about to eat a regular diet. Discussed how if she decides to pursue bariatric surgery, she will most likely be referred for a  nutrition assessment specific for preparation for the surgery, both pre and post surgery.  Intervention: Until cause for nausea and vomiting is found and  improved or resolved try some of the Premier clear liquid protein given and the protein shake if tolerated. Since you are tolerating milk and yogurt, continue to include to add protein and nutrients to the diet. Stay well hydrated with water, sugar free flavored water, Gaterade 0 for example. Once able to eat solid foods: Try mild starches such as mashed potatoes, rice, crackers, etc. Also, bananas and soft fruits such as peaches and applesauce. Introduce soft cooked chicken, tuna, etc. When can eat regular foods:  Read labels for sodium. Try to eat no more than 600 mg per meal even if the frozen dinners. Add fruit or vegetable or both to dinners. Buy low fat brands such as Healthy Choice, Music therapist.  Follow through with bariatric information session.  Education Materials given:  Marland Kitchen Samples of Premier clear liquid and shakes . Goals/ instructions Learner/ who was taught:  . Patient  . Spouse/ partner Level of understanding: . Partial understanding; needs review/ practice Demonstrated degree of understanding via:   Teach back Learning barriers: . None Willingness to learn/ readiness for change: . Acceptance, ready for change  Monitoring and Evaluation:  No follow-up scheduled at this time. Patient to follow through with attending bariatric surgery information session.

## 2018-07-22 NOTE — Patient Instructions (Addendum)
Until cause for nausea and vomiting is found and improved or resolved try some of the Premier clear liquid protein given and the protein shake if tolerated. Since you are tolerating milk and yogurt, continue to include to add protein and nutrients to the diet. Stay well hydrated with water, sugar free flavored water, Gaterade 0 for example. Once able to eat solid foods: Try mild starches such as mashed potatoes, rice, crackers, etc. Also, bananas and soft fruits such as peaches and applesauce. Introduce soft cooked chicken, tuna, etc. When can eat regular foods:  Read labels for sodium. Try to eat no more than 600 mg per meal even if the frozen dinners. Add fruit or vegetable or both to dinners. Buy low fat brands such as Healthy Choice, Music therapist.  Follow through with bariatric information session.

## 2018-07-31 ENCOUNTER — Encounter: Payer: Self-pay | Admitting: Pulmonary Disease

## 2018-07-31 ENCOUNTER — Ambulatory Visit (INDEPENDENT_AMBULATORY_CARE_PROVIDER_SITE_OTHER): Payer: BLUE CROSS/BLUE SHIELD | Admitting: Pulmonary Disease

## 2018-07-31 VITALS — BP 128/92 | HR 87 | Ht 70.0 in | Wt 376.0 lb

## 2018-07-31 DIAGNOSIS — Z01811 Encounter for preprocedural respiratory examination: Secondary | ICD-10-CM

## 2018-07-31 DIAGNOSIS — J4530 Mild persistent asthma, uncomplicated: Secondary | ICD-10-CM

## 2018-07-31 MED ORDER — ALBUTEROL SULFATE HFA 108 (90 BASE) MCG/ACT IN AERS: 2.0000 | INHALATION_SPRAY | Freq: Four times a day (QID) | RESPIRATORY_TRACT | 10 refills | Status: DC | PRN

## 2018-07-31 NOTE — Patient Instructions (Signed)
Continue Symbicort inhaler, 2 sprays twice a day.  Rinse mouth after use Continue Singulair, 10 mg daily at bedtime Continue albuterol inhaler, 2 sprays as needed for increased shortness of breath, chest tightness, wheezing, cough.  Prescription refilled  Follow-up in 6 months or sooner as needed

## 2018-08-02 ENCOUNTER — Encounter: Payer: Self-pay | Admitting: Pulmonary Disease

## 2018-08-02 NOTE — Progress Notes (Signed)
PULMONARY OFFICE FOLLOW UP NOTE  Requesting MD/Service: self Date of initial consultation: 07/23/17 Reason for consultation: asthma  PT PROFILE: 32 y.o. female never smoker with multiple medical problems including asthma, OSA and morbid obesity referred after hospitalization for asthma exacerbation   DATA: 03/20/17 CT chest: low volumes, mild atelectasis, no infiltrates or edema 04/15/17 Echocardiogram: normal LVEF, no RV or RA dilation. RVSP not estimated 08/30/17 PFTs: Poor quality test (poor reproducibility) with no definite obstruction on spirometry. However, there was significant improvement in both FEV1 and FVC after bronchodilator therapy. Lung volumes consistent with moderate to severe restriction. She could not perform the DLCO maneuver 06/22/18 CTA chest: Slightly suboptimal opacification of the pulmonary arterial system which limits the study. No definite acute central PE is seen. Low lung volumes.  No acute pulmonary infiltrate  INTERVAL: Initially presented to ED 06/22/2018 with atypical chest pain and lower extremity edema.  CTA chest negative.  Instructed to increase furosemide to twice a day.  Return to ED 06/29/2018 with similar symptoms.  It was noted that she has frequent presentations with atypical chest pain.  No new diagnosis was rendered at that time.  SUBJ: This is a scheduled follow-up.  At the present time, she has no new complaints.  She reports that she is compliant with meds as documented.  However, she then notes that she has run out of her albuterol inhaler for approximately 1 month.  Prior to that she was using it on average twice a day.  She remains on Symbicort inhaler.  She remains on Singulair which she believes is beneficial.  She informs me that she is considering bariatric surgery.  Presently she denies CP, fever, purulent sputum, hemoptysis, LE edema and calf tenderness    Vitals:   07/31/18 1036 07/31/18 1043  BP:  (!) 128/92  Pulse:  87  SpO2:  96%   Weight: (!) 376 lb (170.6 kg)   Height: 5\' 10"  (1.778 m)   RA   EXAM:  Gen: Mildly lethargic, extremely obese, no overt respiratory distress HEENT: NCAT, sclera white Neck: No JVD Lungs: breath sounds distant without wheezes or other adventitious sounds Cardiovascular: RRR, no murmurs Abdomen: Soft, nontender, normal BS Ext: without clubbing, cyanosis, edema Neuro: grossly intact Skin: Limited exam, no lesions noted   DATA:   BMP Latest Ref Rng & Units 06/29/2018 06/22/2018 02/20/2018  Glucose 70 - 99 mg/dL 865(H) 846(N) 98  BUN 6 - 20 mg/dL 10 <6(E) 11  Creatinine 0.44 - 1.00 mg/dL 9.52 8.41 3.24  Sodium 135 - 145 mmol/L 141 142 138  Potassium 3.5 - 5.1 mmol/L 4.1 3.3(L) 4.1  Chloride 98 - 111 mmol/L 105 106 104  CO2 22 - 32 mmol/L 28 27 23   Calcium 8.9 - 10.3 mg/dL 9.8 9.3 9.3    CBC Latest Ref Rng & Units 06/29/2018 06/22/2018 02/20/2018  WBC 3.6 - 11.0 K/uL 4.3 4.7 6.4  Hemoglobin 12.0 - 16.0 g/dL 40.1 02.7 25.3  Hematocrit 35.0 - 47.0 % 41.2 38.5 42.0  Platelets 150 - 440 K/uL 256 247 270    CXR 06/29/2018: Low lung volumes.  No acute findings    IMPRESSION:     ICD-10-CM   1. Mild persistent asthma without complication J45.30   2. Morbid obesity  E66.01   3. Preoperative respiratory examination Z01.811    Since obesity is really her major medical problem, I would support the decision to undergo bariatric surgery.  However, due to her severe obesity, she will have significantly increased  risk of perioperative complications (as do most patients undergoing bariatric surgery).  I do not think her asthma is a major risk factor for perioperative complications.  At the present time, her asthma is optimized.  PLAN:  Continue Symbicort inhaler, 2 sprays twice daily.  Rinse mouth after use Continue Singulair, 10 mg daily at bedtime Continue albuterol inhaler, 2 sprays as needed.  Prescription refilled  If she does undergo bariatric surgery, she should be maintained on her  usual asthma regimen perioperatively.  Follow-up in 6 months.  Call sooner as needed   Billy Fischer, MD PCCM service Mobile [phone] Pager [phone] 08/02/2018 10:09 AM

## 2018-08-07 ENCOUNTER — Ambulatory Visit: Payer: BLUE CROSS/BLUE SHIELD | Attending: Family | Admitting: Family

## 2018-08-07 ENCOUNTER — Encounter: Payer: Self-pay | Admitting: Family

## 2018-08-07 VITALS — BP 100/60 | HR 79 | Resp 18 | Ht 70.0 in | Wt 370.2 lb

## 2018-08-07 DIAGNOSIS — E669 Obesity, unspecified: Secondary | ICD-10-CM | POA: Insufficient documentation

## 2018-08-07 DIAGNOSIS — G4733 Obstructive sleep apnea (adult) (pediatric): Secondary | ICD-10-CM | POA: Diagnosis not present

## 2018-08-07 DIAGNOSIS — I471 Supraventricular tachycardia: Secondary | ICD-10-CM | POA: Insufficient documentation

## 2018-08-07 DIAGNOSIS — M797 Fibromyalgia: Secondary | ICD-10-CM | POA: Diagnosis not present

## 2018-08-07 DIAGNOSIS — Z79899 Other long term (current) drug therapy: Secondary | ICD-10-CM | POA: Insufficient documentation

## 2018-08-07 DIAGNOSIS — J45909 Unspecified asthma, uncomplicated: Secondary | ICD-10-CM | POA: Diagnosis not present

## 2018-08-07 DIAGNOSIS — I5032 Chronic diastolic (congestive) heart failure: Secondary | ICD-10-CM

## 2018-08-07 DIAGNOSIS — F319 Bipolar disorder, unspecified: Secondary | ICD-10-CM | POA: Insufficient documentation

## 2018-08-07 DIAGNOSIS — I110 Hypertensive heart disease with heart failure: Secondary | ICD-10-CM | POA: Insufficient documentation

## 2018-08-07 DIAGNOSIS — R569 Unspecified convulsions: Secondary | ICD-10-CM | POA: Diagnosis not present

## 2018-08-07 DIAGNOSIS — R0789 Other chest pain: Secondary | ICD-10-CM | POA: Insufficient documentation

## 2018-08-07 DIAGNOSIS — F419 Anxiety disorder, unspecified: Secondary | ICD-10-CM | POA: Insufficient documentation

## 2018-08-07 DIAGNOSIS — I890 Lymphedema, not elsewhere classified: Secondary | ICD-10-CM | POA: Insufficient documentation

## 2018-08-07 DIAGNOSIS — R7303 Prediabetes: Secondary | ICD-10-CM | POA: Diagnosis not present

## 2018-08-07 DIAGNOSIS — I10 Essential (primary) hypertension: Secondary | ICD-10-CM

## 2018-08-07 NOTE — Patient Instructions (Signed)
Continue weighing daily and call for an overnight weight gain of > 2 pounds or a weekly weight gain of >5 pounds. 

## 2018-08-07 NOTE — Progress Notes (Signed)
Subjective:    Patient ID: [name], female    DOB: [birth year], 32 y.o.   MRN: [id number]  HPI  Ms Krupp is a 32 y/o female with a history of asthma, HTN, SVT, obesity, bipolar, anxiety, depression, fibromyalgia, seizures, bulging disc and chronic heart failure.   Echo report from 07/04/18 reviewed and showed an EF of 73%. Echo report from 04/15/17 reviewed and showed an EF of 60-65%.  Was in the ED 06/29/18 due to chest pain where she was treated and released after work-up was negative. Was in the ED 06/22/18 due to chest pain. [place] negative for DVT. Chest CT negative for PE. Released same day.   She presents today for a follow-up visit with a chief complaint of moderate shortness of breath upon minimal exertion. She has associated fatigue, headaches, light-headedness, intermittent chest pain, pedal edema, palpitations, back pain and depression. She denies any difficulty sleeping, abdominal distention, cough or weight gain. She feels like her asthma is getting worse with the hot/humid weather.   Past Medical History:  Diagnosis Date  . Anxiety   . Asthma   . Bipolar disorder (HCC)   . Chest pain    and angina  . CHF (congestive heart failure) (HCC)   . Depression   . Fibromyalgia   . Hypertension   . 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 Obesity   . Pre-diabetes   . Seizures (HCC)    secondary to anxiety  last seizure 01/25/18  . SVT (supraventricular tachycardia) (HCC)    with hx of syncope; managed by [facility]   Past Surgical History:  Procedure Laterality Date  . CHOLECYSTECTOMY N/A 02/22/2018   Procedure: LAPAROSCOPIC CHOLECYSTECTOMY;  Surgeon: Ancil Linsey, MD;  Location: [facility] ORS;  Service: General;  Laterality: N/A;  . TONSILLECTOMY AND ADENOIDECTOMY N/A 09/05/2016   Procedure: TONSILLECTOMY AND ADENOIDECTOMY;  Surgeon: Linus Salmons, MD;  Location: [facility] ORS;  Service: ENT;  Laterality: N/A;   Family History  Problem Relation  Age of Onset  . Diabetes Mother   . Hypertension Mother   . Asthma Mother   . Gallstones Mother   . Diabetes Father   . Hypertension Father   . Gallstones Father    Social History   Tobacco Use  . Smoking status: Never Smoker  . Smokeless tobacco: Never Used  Substance Use Topics  . Alcohol use: No   Allergies  Allergen Reactions  . Cortizone-10 [Hydrocortisone] Shortness Of Breath and Swelling  . Garlic Anaphylaxis  . Prednisone Swelling    Tongue swelling  . Corticosteroids Palpitations   Prior to Admission medications   Medication Sig Start Date End Date Taking? Authorizing Provider  albuterol (PROVENTIL HFA;VENTOLIN HFA) 108 (90 Base) MCG/ACT inhaler Inhale 2 puffs into the lungs every 6 (six) hours as needed for wheezing or shortness of breath. 07/31/18  Yes Merwyn Katos, MD  albuterol (PROVENTIL) (2.5 MG/3ML) 0.083% nebulizer solution Take 2.5 mg by nebulization every 4 (four) hours as needed for wheezing or shortness of breath.   Yes [provider]  budesonide-formoterol (SYMBICORT) 160-4.5 MCG/ACT inhaler Inhale 2 puffs into the lungs 2 (two) times daily. Patient taking differently: Inhale 1 puff into the lungs 2 (two) times daily.  01/31/18  Yes Merwyn Katos, MD  Calcium-Magnesium 500-250 MG TABS Take 1 tablet by mouth 2 (two) times daily.    Yes [provider]  clonazePAM (KLONOPIN) 2 MG tablet Take 2 mg by  mouth 3 (three) times daily.    Yes [provider]  Dexlansoprazole 30 MG capsule Take 30 mg by mouth daily.   Yes [provider]  diltiazem (CARDIZEM CD) 360 MG 24 hr capsule Take 360 mg by mouth daily.    Yes [provider]  escitalopram (LEXAPRO) 20 MG tablet Take 20 mg by mouth daily.   Yes [provider]  fluticasone (FLONASE) 50 MCG/ACT nasal spray Place 2 sprays into both nostrils daily.   Yes [provider]  folic acid (FOLVITE) 1 MG tablet Take 1 mg by mouth daily.  12/15/16  Yes  [provider]  furosemide (LASIX) 20 MG tablet Take 40 mg by mouth 2 (two) times daily.    Yes [provider]  ibuprofen (ADVIL,MOTRIN) 600 MG tablet Take 600 mg by mouth every 6 (six) hours as needed.   Yes [provider]  lactulose (CHRONULAC) 10 GM/15ML solution Take 10 g by mouth 2 (two) times daily as needed for mild constipation.   Yes [provider]  lamoTRIgine (LAMICTAL) 100 MG tablet Take 200 mg by mouth 2 (two) times daily.    Yes [provider]  linaclotide (LINZESS) 290 MCG CAPS capsule Take 290 mcg by mouth daily before breakfast.   Yes [provider]  montelukast (SINGULAIR) 10 MG tablet Take 1 tablet (10 mg total) by mouth at bedtime. 01/31/18 01/31/19 Yes Merwyn Katos, MD  norethindrone (MICRONOR,CAMILA,ERRIN) 0.35 MG tablet Take 1 tablet by mouth daily.   Yes [provider]  potassium chloride (K-DUR) 10 MEQ tablet Take 20 mEq by mouth 2 (two) times daily. Take 10 meq once daily then the next day take 10 meq twice daily   Yes [provider]  pregabalin (LYRICA) 100 MG capsule Take 100 mg by mouth 2 (two) times daily.   Yes [provider]  Prenatal Vit-Fe Fumarate-FA (MULTIVITAMIN-PRENATAL) 27-0.8 MG TABS tablet Take 1 tablet by mouth daily at 12 noon.   Yes [provider]  promethazine (PHENERGAN) 12.5 MG tablet Take 2 tablets (25 mg total) by mouth every 6 (six) hours as needed for nausea or vomiting. 02/20/18  Yes Governor Rooks, MD  propranolol (INDERAL) 40 MG tablet Take 80 mg by mouth 3 (three) times daily.    Yes [provider]  risperiDONE (RISPERDAL) 1 MG tablet Take 2 mg by mouth daily.    Yes [provider]  rosuvastatin (CRESTOR) 20 MG tablet Take 20 mg by mouth daily.   Yes [provider]  tiZANidine (ZANAFLEX) 4 MG tablet Take 4 mg by mouth 3 (three) times daily.  03/26/17  Yes [provider]  Topiramate ER (TROKENDI XR) 200 MG CP24  Take 1 capsule by mouth daily.    Yes [provider]    Review of Systems  Constitutional: Positive for fatigue. Negative for appetite change.  HENT: Negative for congestion, postnasal drip and sore throat.   Eyes: Negative.   Respiratory: Positive for shortness of breath. Negative for cough.   Cardiovascular: Positive for chest pain (intermittent), palpitations (at times) and leg swelling (better, now just in the foot).  Gastrointestinal: Negative for abdominal distention and abdominal pain.  Endocrine: Negative.   Genitourinary: Negative.   Musculoskeletal: Positive for arthralgias (leg pain) and back pain (due to bulging disc).  Skin: Negative.   Allergic/Immunologic: Negative.   Neurological: Positive for light-headedness and headaches. Negative for dizziness.  Hematological: Negative for adenopathy. Does not bruise/bleed easily.  Psychiatric/Behavioral: Positive  for dysphoric mood. Negative for sleep disturbance (wearing CPAP at night). The patient is not nervous/anxious.       Vitals:   08/07/18 1318  BP: 100/60  Pulse: 79  Resp: 18  SpO2: 98%  Weight: (!) 370 lb 4 oz (167.9 kg)  Height: 5\' 10"  (1.778 m)   Wt Readings from Last 3 Encounters:  08/07/18 (!) 370 lb 4 oz (167.9 kg)  07/31/18 (!) 376 lb (170.6 kg)  07/22/18 (!) 376 lb 11.2 oz (170.9 kg)   Lab Results  Component Value Date   CREATININE 0.85 06/29/2018   CREATININE 0.72 06/22/2018   CREATININE 0.71 02/20/2018    Objective:   Physical Exam  Constitutional: She is oriented to person, place, and time. She appears well-developed and well-nourished.  HENT:  Head: Normocephalic and atraumatic.  Neck: Normal range of motion. Neck supple. No JVD present.  Cardiovascular: Normal rate and regular rhythm.  Pulmonary/Chest: Effort normal. No respiratory distress. She has no wheezes. She has no rales.  Abdominal: Soft. She exhibits no distension.  Musculoskeletal:       Right lower leg: She exhibits  edema (1+ pitting). She exhibits no tenderness.       Left lower leg: She exhibits edema (1+ pitting). She exhibits no tenderness.  Neurological: She is alert and oriented to person, place, and time.  Skin: Skin is warm and dry.  Psychiatric: She has a normal mood and affect. Her behavior is normal.  Nursing note and vitals reviewed.     Assessment & Plan:   1: Chronic heart failure with preserved ejection fraction- - NYHA class III - euvolemic today - weighing daily; reminded to call for an overnight weight gain of >2 pounds or a weekly weight gain of >5 pounds - weight down 11 pounds since she was last here ~ 1 month ago - not adding salt and occasionally reads food labels; reviewed the importance of closely following a 2000mg  sodium diet  - saw cardiology Welton Flakes) 07/04/18  - saw pulmonologist Sung Amabile) 07/31/18 - drinking ~ 64 ounces of fluid daily - BNP 06/29/18 was 49.0 - had labs done at [facility] so will request those results  2: HTN- - BP good today although on the low side - saw PCP Letta Pate) at [facility] last week - BMP 06/29/18 reviewed and showed sodium 141, potassium 4.1 and GFR >60  3: Obstructive sleep apnea- - wearing CPAP nightly and feels like she's sleeping well  4: Lymphedema- - stage 2 - elevating legs some during the day - limited in her ability to exercise due to disc in her back causing pain - wearing compression socks but edema persists - will make a referral for lymphapress compression boots   Patient did not bring her medications nor a list. Each medication was verbally reviewed with the patient and she was encouraged to bring the bottles to every visit to confirm accuracy of list.  Return in 3 months or sooner for any questions/problems before then.

## 2018-08-08 ENCOUNTER — Encounter: Payer: Self-pay | Admitting: Family

## 2018-08-09 ENCOUNTER — Encounter: Payer: Self-pay | Admitting: Emergency Medicine

## 2018-08-09 ENCOUNTER — Emergency Department
Admission: EM | Admit: 2018-08-09 | Discharge: 2018-08-09 | Disposition: A | Discharge status: Home / Self Care | Payer: BLUE CROSS/BLUE SHIELD | Attending: Emergency Medicine | Admitting: Emergency Medicine

## 2018-08-09 ENCOUNTER — Other Ambulatory Visit: Payer: Self-pay

## 2018-08-09 ENCOUNTER — Emergency Department: Payer: BLUE CROSS/BLUE SHIELD

## 2018-08-09 DIAGNOSIS — I5032 Chronic diastolic (congestive) heart failure: Secondary | ICD-10-CM | POA: Insufficient documentation

## 2018-08-09 DIAGNOSIS — F445 Conversion disorder with seizures or convulsions: Secondary | ICD-10-CM | POA: Insufficient documentation

## 2018-08-09 DIAGNOSIS — I110 Hypertensive heart disease with heart failure: Secondary | ICD-10-CM | POA: Insufficient documentation

## 2018-08-09 DIAGNOSIS — E119 Type 2 diabetes mellitus without complications: Secondary | ICD-10-CM | POA: Insufficient documentation

## 2018-08-09 DIAGNOSIS — Z79899 Other long term (current) drug therapy: Secondary | ICD-10-CM | POA: Insufficient documentation

## 2018-08-09 DIAGNOSIS — R569 Unspecified convulsions: Secondary | ICD-10-CM | POA: Diagnosis present

## 2018-08-09 DIAGNOSIS — J45909 Unspecified asthma, uncomplicated: Secondary | ICD-10-CM | POA: Insufficient documentation

## 2018-08-09 LAB — COMPREHENSIVE METABOLIC PANEL
ALT: 9 U/L (ref 0–44)
AST: 13 U/L — ABNORMAL LOW (ref 15–41)
Albumin: 4.2 g/dL (ref 3.5–5.0)
Alkaline Phosphatase: 76 U/L (ref 38–126)
Anion gap: 9 (ref 5–15)
BUN: 9 mg/dL (ref 6–20)
CO2: 28 mmol/L (ref 22–32)
Calcium: 9.3 mg/dL (ref 8.9–10.3)
Chloride: 103 mmol/L (ref 98–111)
Creatinine, Ser: 1 mg/dL (ref 0.44–1.00)
GFR calc Af Amer: >60 mL/min (ref >60)
GFR calc non Af Amer: >60 mL/min (ref >60)
Glucose, Bld: 118 mg/dL — ABNORMAL HIGH (ref 70–99)
Potassium: 3.1 mmol/L — ABNORMAL LOW (ref 3.5–5.1)
Sodium: 140 mmol/L (ref 135–145)
Total Bilirubin: 0.6 mg/dL (ref 0.3–1.2)
Total Protein: 7.8 g/dL (ref 6.5–8.1)

## 2018-08-09 LAB — CBC WITH DIFFERENTIAL/PLATELET
Basophils Absolute: 0 10*3/uL (ref 0–0.1)
Basophils Relative: 1 %
Eosinophils Absolute: 0 10*3/uL (ref 0–0.7)
Eosinophils Relative: 1 %
HCT: 37.1 % (ref 35.0–47.0)
Hemoglobin: 12.3 g/dL (ref 12.0–16.0)
Lymphocytes Relative: 40 %
Lymphs Abs: 1.6 10*3/uL (ref 1.0–3.6)
MCH: 27.6 pg (ref 26.0–34.0)
MCHC: 33.1 g/dL (ref 32.0–36.0)
MCV: 83.4 fL (ref 80.0–100.0)
Monocytes Absolute: 0.5 10*3/uL (ref 0.2–0.9)
Monocytes Relative: 11 %
Neutro Abs: 1.9 10*3/uL (ref 1.4–6.5)
Neutrophils Relative %: 47 %
Platelets: 235 10*3/uL (ref 150–440)
RBC: 4.45 MIL/uL (ref 3.80–5.20)
RDW: 15.1 % — ABNORMAL HIGH (ref 11.5–14.5)
WBC: 4.1 10*3/uL (ref 3.6–11.0)

## 2018-08-09 LAB — URINALYSIS, COMPLETE (UACMP) WITH MICROSCOPIC
Bilirubin Urine: NEGATIVE
Glucose, UA: NEGATIVE mg/dL
Hgb urine dipstick: NEGATIVE
Ketones, ur: NEGATIVE mg/dL
Leukocytes, UA: NEGATIVE
Nitrite: POSITIVE — AB
Protein, ur: NEGATIVE mg/dL
Specific Gravity, Urine: 1.019 (ref 1.005–1.030)
pH: 6 (ref 5.0–8.0)

## 2018-08-09 LAB — URINE DRUG SCREEN, QUALITATIVE (ARMC ONLY)
Amphetamines, Ur Screen: NOT DETECTED
Barbiturates, Ur Screen: NOT DETECTED
Benzodiazepine, Ur Scrn: NOT DETECTED
Cannabinoid 50 Ng, Ur ~~LOC~~: NOT DETECTED
Cocaine Metabolite,Ur ~~LOC~~: NOT DETECTED
MDMA (Ecstasy)Ur Screen: NOT DETECTED
Methadone Scn, Ur: NOT DETECTED
Opiate, Ur Screen: NOT DETECTED
Phencyclidine (PCP) Ur S: NOT DETECTED
Tricyclic, Ur Screen: NOT DETECTED

## 2018-08-09 LAB — ETHANOL: Alcohol, Ethyl (B): <10 mg/dL (ref <10)

## 2018-08-09 LAB — TROPONIN I: Troponin I: <0.03 ng/mL (ref <0.03)

## 2018-08-09 MED ORDER — KETOROLAC TROMETHAMINE 30 MG/ML IJ SOLN
15.0000 mg | Freq: Once | INTRAMUSCULAR | Status: AC
  Administered 2018-08-09: 15 mg via INTRAVENOUS
  Filled 2018-08-09: qty 1

## 2018-08-09 MED ORDER — FOSFOMYCIN TROMETHAMINE 3 G PO PACK
3.0000 g | PACK | Freq: Once | ORAL | Status: AC
  Administered 2018-08-09: 3 g via ORAL
  Filled 2018-08-09: qty 3

## 2018-08-09 MED ORDER — LORAZEPAM 1 MG PO TABS
1.0000 mg | ORAL_TABLET | Freq: Once | ORAL | Status: AC
  Administered 2018-08-09: 1 mg via ORAL
  Filled 2018-08-09: qty 1

## 2018-08-09 NOTE — ED Triage Notes (Signed)
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 States takes meds and sees neurologist, but takes epilepsy meds for pain. Also asking to wear oxygen because "it feels nice" although room air O2 98%. A&O x4. NAD. Neuro WNL.

## 2018-08-09 NOTE — ED Notes (Signed)
2 unsuccessful US-guided PIV attempts by this RN. Revonda Standard, primary RN made aware.

## 2018-08-09 NOTE — ED Notes (Signed)
NEGATIVE urine preg

## 2018-08-09 NOTE — ED Provider Notes (Signed)
-----------------------------------------   9:14 AM on 08/09/2018 -----------------------------------------  CT scans are negative.  Labs are largely within normal limits.  Urinalysis is nitrite positive.  We will dose a one-time dose of fosfomycin and send a urine culture as a precaution.  The patient continues to appear well in the emergency department.  We will discharge home with PCP follow-up.   Minna Antis, MD 08/09/18 [phone]

## 2018-08-09 NOTE — ED Provider Notes (Signed)
[facility] Emergency Department Provider Note   ____________________________________________   First MD Initiated Contact with Patient 08/09/18 0350     (approximate)  I have reviewed the triage vital signs and the nursing notes.   HISTORY  Chief Complaint Shaking (pseudoseizures)    HPI Andrea Miller is a 32 y.o. female brought to the ED from home via EMS for pseudoseizures.  Patient reports history of "epilepsy and seizures", sees a neurologist and takes Trokendi.  States tonight she started "shaking all over".  Walked to her mother's bedroom to wake her mother up, but states she herself was not awake when asked about consciousness.  Did not bite tongue but reports urinary incontinence.  Denies recent fever, chills, chest pain, shortness of breath, abdominal pain, nausea or vomiting.  Denies increase in stress level.  Denies recent travel.   Past Medical History:  Diagnosis Date  . Anxiety   . Asthma   . Bipolar disorder (HCC)   . Chest pain    and angina  . CHF (congestive heart failure) (HCC)   . Depression   . Fibromyalgia   . Hypertension   . 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 Obesity   . Pre-diabetes   . Seizures (HCC)    secondary to anxiety  last seizure 01/25/18  . SVT (supraventricular tachycardia) (HCC)    with hx of syncope; managed by [facility]    Patient Active Problem List   Diagnosis Date Noted  . Chronic diastolic heart failure (HCC) 07/01/2018  . Lymphedema 07/01/2018  . Chronic cholecystitis   . Edema 02/13/2018  . Diabetes mellitus type 2, uncomplicated (HCC) 02/13/2018  . Hypertension 11/23/2017  . Asthma exacerbation 04/14/2017  . Asthma without status asthmaticus 01/16/2017  . Bipolar affective (HCC) 01/16/2017  . Coronary artery disease 01/16/2017  . Syncope 12/12/2016  . Fibromyalgia 04/27/2014  . OSA (obstructive sleep apnea) 04/27/2014  . Seizure-like activity (HCC)  04/27/2014  . Migraine without status migrainosus, not intractable 04/27/2014  . Obesity 04/15/2014  . Sleep disorder 04/15/2014  . Neck pain 04/15/2014  . Headache 04/15/2014  . Restless legs syndrome 04/15/2014  . Supraventricular tachycardia (HCC) 07/13/2009    Past Surgical History:  Procedure Laterality Date  . CHOLECYSTECTOMY N/A 02/22/2018   Procedure: LAPAROSCOPIC CHOLECYSTECTOMY;  Surgeon: Ancil Linsey, MD;  Location: [facility] ORS;  Service: General;  Laterality: N/A;  . TONSILLECTOMY AND ADENOIDECTOMY N/A 09/05/2016   Procedure: TONSILLECTOMY AND ADENOIDECTOMY;  Surgeon: Linus Salmons, MD;  Location: [facility] ORS;  Service: ENT;  Laterality: N/A;    Prior to Admission medications   Medication Sig Start Date End Date Taking? Authorizing Provider  albuterol (PROVENTIL HFA;VENTOLIN HFA) 108 (90 Base) MCG/ACT inhaler Inhale 2 puffs into the lungs every 6 (six) hours as needed for wheezing or shortness of breath. 07/31/18   Merwyn Katos, MD  albuterol (PROVENTIL) (2.5 MG/3ML) 0.083% nebulizer solution Take 2.5 mg by nebulization every 4 (four) hours as needed for wheezing or shortness of breath.    [provider]  budesonide-formoterol (SYMBICORT) 160-4.5 MCG/ACT inhaler Inhale 2 puffs into the lungs 2 (two) times daily. Patient taking differently: Inhale 1 puff into the lungs 2 (two) times daily.  01/31/18   Merwyn Katos, MD  Calcium-Magnesium 500-250 MG TABS Take 1 tablet by mouth 2 (two) times daily.     [provider]  clonazePAM (KLONOPIN) 2 MG tablet Take 2 mg by mouth 3 (three)  times daily.     [provider]  Dexlansoprazole 30 MG capsule Take 30 mg by mouth daily.    [provider]  diltiazem (CARDIZEM CD) 360 MG 24 hr capsule Take 360 mg by mouth daily.     [provider]  escitalopram (LEXAPRO) 20 MG tablet Take 20 mg by mouth daily.    [provider]  fluticasone (FLONASE) 50 MCG/ACT nasal spray Place 2 sprays  into both nostrils daily.    [provider]  folic acid (FOLVITE) 1 MG tablet Take 1 mg by mouth daily.  12/15/16   [provider]  furosemide (LASIX) 20 MG tablet Take 40 mg by mouth 2 (two) times daily.     [provider]  ibuprofen (ADVIL,MOTRIN) 600 MG tablet Take 600 mg by mouth every 6 (six) hours as needed.    [provider]  lactulose (CHRONULAC) 10 GM/15ML solution Take 10 g by mouth 2 (two) times daily as needed for mild constipation.    [provider]  lamoTRIgine (LAMICTAL) 100 MG tablet Take 200 mg by mouth 2 (two) times daily.     [provider]  linaclotide (LINZESS) 290 MCG CAPS capsule Take 290 mcg by mouth daily before breakfast.    [provider]  montelukast (SINGULAIR) 10 MG tablet Take 1 tablet (10 mg total) by mouth at bedtime. 01/31/18 01/31/19  Merwyn Katos, MD  norethindrone (MICRONOR,CAMILA,ERRIN) 0.35 MG tablet Take 1 tablet by mouth daily.    [provider]  potassium chloride (K-DUR) 10 MEQ tablet Take 20 mEq by mouth 2 (two) times daily. Take 10 meq once daily then the next day take 10 meq twice daily    [provider]  pregabalin (LYRICA) 100 MG capsule Take 100 mg by mouth 2 (two) times daily.    [provider]  Prenatal Vit-Fe Fumarate-FA (MULTIVITAMIN-PRENATAL) 27-0.8 MG TABS tablet Take 1 tablet by mouth daily at 12 noon.    [provider]  promethazine (PHENERGAN) 12.5 MG tablet Take 2 tablets (25 mg total) by mouth every 6 (six) hours as needed for nausea or vomiting. 02/20/18   Governor Rooks, MD  propranolol (INDERAL) 40 MG tablet Take 80 mg by mouth 3 (three) times daily.     [provider]  risperiDONE (RISPERDAL) 1 MG tablet Take 2 mg by mouth daily.     [provider]  rosuvastatin (CRESTOR) 20 MG tablet Take 20 mg by mouth daily.    [provider]  tiZANidine (ZANAFLEX) 4 MG tablet Take 4 mg by mouth 3 (three) times  daily.  03/26/17   [provider]  Topiramate ER (TROKENDI XR) 200 MG CP24 Take 1 capsule by mouth daily.     [provider]    Allergies Cortizone-10 [hydrocortisone]; Garlic; Prednisone; and Corticosteroids  Family History  Problem Relation Age of Onset  . Diabetes Mother   . Hypertension Mother   . Asthma Mother   . Gallstones Mother   . Diabetes Father   . Hypertension Father   . Gallstones Father     Social History Social History   Tobacco Use  . Smoking status: Never Smoker  . Smokeless tobacco: Never Used  Substance Use Topics  . Alcohol use: No  . Drug use: No    Review of Systems  Constitutional: No fever/chills Eyes: No visual changes. ENT: No sore throat. Cardiovascular: Denies chest pain. Respiratory: Denies shortness of breath. Gastrointestinal: No abdominal pain.  No  nausea, no vomiting.  No diarrhea.  No constipation. Genitourinary: Negative for dysuria. Musculoskeletal: Negative for back pain. Skin: Negative for rash. Neurological: Positive for seizure-like activity.  Negative for headaches, focal weakness or numbness.  ____________________________________________   PHYSICAL EXAM:  VITAL SIGNS: ED Triage Vitals  Enc Vitals Group     BP      Pulse      Resp      Temp      Temp src      SpO2      Weight      Height      Head Circumference      Peak Flow      Pain Score      Pain Loc      Pain Edu?      Excl. in GC?     Constitutional: Alert and oriented. Well appearing and in no acute distress. Eyes: Conjunctivae are normal. PERRL. EOMI. Head: Atraumatic. Nose: No external evidence of injury. Mouth/Throat: Mucous membranes are moist.  Did not not bite tongue.. Neck: No stridor.  No cervical spine tenderness to palpation. Cardiovascular: Normal rate, regular rhythm. Grossly normal heart sounds.  Good peripheral circulation. Respiratory: Normal respiratory effort.  No retractions. Lungs CTAB. Gastrointestinal:  Soft and nontender. No distention. No abdominal bruits. No CVA tenderness. Musculoskeletal: No lower extremity tenderness nor edema.  No joint effusions. Neurologic: Alert and oriented x3.  CN II-XII grossly intact.  Normal speech and language. No gross focal neurologic deficits are appreciated.  Skin:  Skin is warm, dry and intact. No rash noted. Psychiatric: Mood and affect are normal. Speech and behavior are normal.  ____________________________________________   LABS (all labs ordered are listed, but only abnormal results are displayed)  Labs Reviewed  CBC WITH DIFFERENTIAL/PLATELET - Abnormal; Notable for the following components:      Result Value   RDW 15.1 (*)    All other components within normal limits  COMPREHENSIVE METABOLIC PANEL - Abnormal; Notable for the following components:   Potassium 3.1 (*)    Glucose, Bld 118 (*)    AST 13 (*)    All other components within normal limits  ETHANOL  TROPONIN I  URINALYSIS, COMPLETE (UACMP) WITH MICROSCOPIC  URINE DRUG SCREEN, QUALITATIVE ([facility] ONLY)  POC URINE PREG, ED   ____________________________________________  EKG  ED ECG REPORT I, Robin Pafford J, the attending physician, personally viewed and interpreted this ECG.   Date: 08/09/2018  EKG Time: 0641  Rate: 75  Rhythm: normal EKG, normal sinus rhythm  Axis: Normal  Intervals:none  ST&T Change: Nonspecific  ____________________________________________  RADIOLOGY  ED MD interpretation: Pending  Official radiology report(s): No results found.  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  Not postictal on examination.  Differential diagnosis includes, but is not limited to, pseudoseizure, epilepsy, alcohol, illicit or prescription medications, or other toxic ingestion; intracranial pathology such as stroke or intracerebral hemorrhage; fever or infectious causes including sepsis; hypoxemia and/or hypercarbia; uremia; trauma; endocrine related disorders such as diabetes, hypoglycemia, and thyroid-related diseases; hypertensive encephalopathy; etc.   Clinical Course as of  Aug 09 733  Fri Aug 09, 2018  8022 Patient complaining of neck pain.  States she struck her head when she fell.  Will obtain CT head and cervical spine.  Awaiting urinalysis. Care transferred to Dr. Lenard Lance pending above results.   [JS]    Clinical Course User Index [JS] Irean Hong, MD     ____________________________________________   FINAL CLINICAL IMPRESSION(S) / ED DIAGNOSES  Final diagnoses:  Pseudoseizure     ED Discharge Orders    None       Note:  This document was prepared using Dragon voice recognition software and may include unintentional dictation errors.    Irean Hong, MD 08/09/18 [phone]

## 2018-08-11 LAB — URINE CULTURE: Culture: >=100000 — AB

## 2018-08-12 ENCOUNTER — Other Ambulatory Visit: Payer: Self-pay | Admitting: Surgery

## 2018-08-13 ENCOUNTER — Ambulatory Visit: Payer: Self-pay | Admitting: Family

## 2018-08-15 ENCOUNTER — Ambulatory Visit
Admission: RE | Admit: 2018-08-15 | Discharge: 2018-08-15 | Disposition: A | Discharge status: Home / Self Care | Payer: BLUE CROSS/BLUE SHIELD | Source: Ambulatory Visit | Attending: Surgery | Admitting: Surgery

## 2018-08-16 ENCOUNTER — Ambulatory Visit: Payer: BLUE CROSS/BLUE SHIELD | Attending: Neurology

## 2018-08-20 ENCOUNTER — Encounter: Payer: Self-pay | Admitting: Dietician

## 2018-08-20 ENCOUNTER — Encounter: Payer: BLUE CROSS/BLUE SHIELD | Attending: Nurse Practitioner | Admitting: Dietician

## 2018-08-20 ENCOUNTER — Ambulatory Visit: Payer: Self-pay | Admitting: Dietician

## 2018-08-20 NOTE — Progress Notes (Signed)
Nutrition and Diabetes Education [facility] [address] [place], [place] [address]  Date: 08/20/18 Re: Andrea Miller DOB: Nov 14, 1986 MRN:  [id number]  MD: Dr. Luretha Murphy  RD:   Darrel Reach, RD, LDN        Diagnosis: Nutrition Assessment: Patient came for pre-bariatric nutrition evaluation visit. She states that she plans to have  Gastric bypass bariatric surgery.   Height:   70 in Weight:   367.3 lbs BMI: 54 IBW/ %IBW: 224% of IBW  Weight Loss Goal: 180-200 lbs  Medical History:  Seizure disorder, bipolar, fibromyalgia, hypertension, CHF, hyperglycemia, Medications/Supplements:  Proventil, symbicort, calcium-magnesium, klonopin, cardezem, lexapro, flonase, folvite, lasix, advil, lamictal, linzess, singulair, norethindrone, K-Dur, lyrica, muti-vitamin, phenergan, inderal, risperdal, crestor, zanaflex, trokendi, xenical  Drug Allergies: cortizone-10, Prednisone Food Allergies:  garlic Alcohol Intake: none Tobacco Use: none Physical Activity: Describes exercise as "light"; walking 4 days per week for 15-20 minutes Weight History: States, "I have been overweight as long as I can remember". Gives a weight of 180-200 in high school. States in past year, weight has remained within a 5 lb range.    Diet/Weight Loss History: Patient reports numerous diets she has tried. These include Optifast, Weight Watchers, herbal life, Nutrisystems, Slim fast and liquid protein diets. She reports she will lose 20-30 lbs and then regain that and more. She also takes Xenical to help decrease appetite.  Her weight today is 9 lbs less than her weight 8/'19.  Dietary history: Food and Fluid: Patient reports she has been trying to make some positive diet changes recently.She states she has added more protein to her diet, has stopped eating "out" and is limiting her snacks. She is eating 3 meals/day with breakfast example of egg, 1 cup milk, cheese; lunch-  boiled chicken, broccoli, Crystal lite and dinner example of spaghetti, salad and Crystal lite.            Beverages: Crystal lite added to water is her main beverage and she reports she drinks 4-5 bottles daily. She also drinks 2 cups of 2% milk/day and diet coke, once weekly. Dines out: none presently Grocery shopping/ cooking: She states that her friend Minerva Areola who lives with her cooks and her sister and mom do as well when they visit.  Psychosocial: Patient states her sister, mother and her friend,Eric are very supportive.  She denies use of laxatives or vomiting to facilitate weight loss  Instruction: Patient was instructed on and is aware of the following guideline/recommendations:  Pre and Post operative dietary guidelines  The need for daily multi-vitamin, calcium, and B12 supplementation following surgery.  The post surgery diet is a high protein diet.  The importance of adequate fluid post surgery.   Alcohol intake and cigarette use are strongly discouraged after surgery.  That carbonated beverages must be eliminated 2 weeks prior to surgery and after surgery.  That planning pre op and post op meals is important.  Exercise is a needed adjunct for weight loss.  Summary: From a nutrition standpoint, I feel that  Ms. Fowles with support from her family               is a  candidate to continue the bariatric surgery process. She has attended an Conservation officer, historic buildings, watched videos online and read the packet of information given to her at the seminar. She has already made positive diet changes and has lost weight in the past month.        The patient is  requested to participate in pre and post-operative follow-up appointments (2-weeks pre-op, 2 weeks post-op, 8-weeks post-op, 3 months post-op, 42-months post op, and 1-year post-op). This is to promote his weight loss goals.  If you have any questions or need any further information regarding your patient, please feel free to  contact me at [phone].  Sincerely,   Darrel Reach, RD, LDN Registered Dietitian Nutrition and Diabetes Education [facility] System O: [phone] F: [phone] [email or web address]

## 2018-08-21 ENCOUNTER — Institutional Professional Consult (permissible substitution): Payer: Self-pay | Admitting: Radiation Oncology

## 2018-08-22 ENCOUNTER — Other Ambulatory Visit: Payer: Self-pay

## 2018-08-22 ENCOUNTER — Encounter: Payer: Self-pay | Admitting: Radiation Oncology

## 2018-08-22 ENCOUNTER — Ambulatory Visit
Admission: RE | Admit: 2018-08-22 | Discharge: 2018-08-22 | Disposition: A | Discharge status: Home / Self Care | Payer: BLUE CROSS/BLUE SHIELD | Source: Ambulatory Visit | Attending: Radiation Oncology | Admitting: Radiation Oncology

## 2018-08-22 VITALS — BP 136/92 | HR 92 | Resp 16 | Ht 70.0 in | Wt 371.0 lb

## 2018-08-22 DIAGNOSIS — J45909 Unspecified asthma, uncomplicated: Secondary | ICD-10-CM | POA: Insufficient documentation

## 2018-08-22 DIAGNOSIS — F418 Other specified anxiety disorders: Secondary | ICD-10-CM | POA: Diagnosis not present

## 2018-08-22 DIAGNOSIS — L910 Hypertrophic scar: Secondary | ICD-10-CM | POA: Diagnosis not present

## 2018-08-22 DIAGNOSIS — I471 Supraventricular tachycardia: Secondary | ICD-10-CM | POA: Diagnosis not present

## 2018-08-22 DIAGNOSIS — I509 Heart failure, unspecified: Secondary | ICD-10-CM | POA: Diagnosis not present

## 2018-08-22 DIAGNOSIS — F319 Bipolar disorder, unspecified: Secondary | ICD-10-CM | POA: Insufficient documentation

## 2018-08-22 DIAGNOSIS — E669 Obesity, unspecified: Secondary | ICD-10-CM | POA: Insufficient documentation

## 2018-08-22 DIAGNOSIS — G40909 Epilepsy, unspecified, not intractable, without status epilepticus: Secondary | ICD-10-CM | POA: Insufficient documentation

## 2018-08-22 DIAGNOSIS — I10 Essential (primary) hypertension: Secondary | ICD-10-CM | POA: Insufficient documentation

## 2018-08-22 DIAGNOSIS — Z79899 Other long term (current) drug therapy: Secondary | ICD-10-CM | POA: Insufficient documentation

## 2018-08-22 NOTE — Consult Note (Signed)
NEW PATIENT EVALUATION  Name: Andrea Miller  MRN: [id number]  Date:   08/22/2018     DOB: 1985-12-11   This 32 y.o. female patient presents to the clinic for initial evaluation of right year keloid status post excision.  REFERRING PHYSICIAN: Emogene Morgan, MD  CHIEF COMPLAINT:  Chief Complaint  Patient presents with  . 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   PREVIOUS INVESTIGATIONS:  Clinical notes reviewed  HPI: patient is a 32 year old female with a keloid of the right earlobe who had resection of the keloid scarsecondary to it being symptomatically and growing. She had it removed 924 and is done well with still some Steri-Strips present. She is seen today forpostoperative radiation to try to prevent recurrence of the keloid. She is asymptomatic at this time.  PLANNED TREATMENT REGIMEN: electron beam therapy to the rightearlobe  PAST MEDICAL HISTORY:  has a past medical history of Anxiety, Asthma, Bipolar disorder (HCC), Chest pain, CHF (congestive heart failure) (HCC), Depression, Fibromyalgia, Hypertension, Left lumbar radiculopathy (since 08/2014), Obesity, Pre-diabetes, Seizures (HCC), and SVT (supraventricular tachycardia) (HCC).    PAST SURGICAL HISTORY:  Past Surgical History:  Procedure Laterality Date  . CHOLECYSTECTOMY N/A 02/22/2018   Procedure: LAPAROSCOPIC CHOLECYSTECTOMY;  Surgeon: Ancil Linsey, MD;  Location: [facility] ORS;  Service: General;  Laterality: N/A;  . TONSILLECTOMY AND ADENOIDECTOMY N/A 09/05/2016   Procedure: TONSILLECTOMY AND ADENOIDECTOMY;  Surgeon: Linus Salmons, MD;  Location: [facility] ORS;  Service: ENT;  Laterality: N/A;    FAMILY HISTORY: family history includes Asthma in her mother; Diabetes in her father and mother; Gallstones in her father and mother; Hypertension in her father and mother.  SOCIAL HISTORY:  reports that she has never smoked. She has never used smokeless tobacco. She reports that she  does not drink alcohol or use drugs.  ALLERGIES: Cortizone-10 [hydrocortisone]; Garlic; Prednisone; and Corticosteroids  MEDICATIONS:  Current Outpatient Medications  Medication Sig Dispense Refill  . albuterol (PROVENTIL HFA;VENTOLIN HFA) 108 (90 Base) MCG/ACT inhaler Inhale 2 puffs into the lungs every 6 (six) hours as needed for wheezing or shortness of breath. 1 Inhaler 10  . albuterol (PROVENTIL) (2.5 MG/3ML) 0.083% nebulizer solution Take 2.5 mg by nebulization every 4 (four) hours as needed for wheezing or shortness of breath.    . budesonide-formoterol (SYMBICORT) 160-4.5 MCG/ACT inhaler Inhale 2 puffs into the lungs 2 (two) times daily. (Patient taking differently: Inhale 1 puff into the lungs 2 (two) times daily. ) 1 Inhaler 10  . Calcium-Magnesium 500-250 MG TABS Take 1 tablet by mouth 2 (two) times daily.     . clonazePAM (KLONOPIN) 2 MG tablet Take 2 mg by mouth 3 (three) times daily.     Marland Kitchen diltiazem (CARDIZEM CD) 360 MG 24 hr capsule Take 360 mg by mouth daily.     Marland Kitchen escitalopram (LEXAPRO) 20 MG tablet Take 20 mg by mouth daily.    . fluticasone (FLONASE) 50 MCG/ACT nasal spray Place 2 sprays into both nostrils daily as needed for allergies.     . folic acid (FOLVITE) 1 MG tablet Take 1 mg by mouth daily.   10  . furosemide (LASIX) 20 MG tablet Take 40 mg by mouth 2 (two) times daily.     Marland Kitchen ibuprofen (ADVIL,MOTRIN) 600 MG tablet Take 600 mg by mouth every 6 (six) hours as needed for moderate pain.     Marland Kitchen lamoTRIgine (LAMICTAL) 100 MG tablet Take 200 mg by  mouth 2 (two) times daily.     Marland Kitchen linaclotide (LINZESS) 290 MCG CAPS capsule Take 290 mcg by mouth daily before breakfast.    . montelukast (SINGULAIR) 10 MG tablet Take 1 tablet (10 mg total) by mouth at bedtime. 30 tablet 10  . norethindrone (MICRONOR,CAMILA,ERRIN) 0.35 MG tablet Take 1 tablet by mouth daily.    Marland Kitchen orlistat (XENICAL) 120 MG capsule Take 120 mg by mouth 3 (three) times daily with meals.    . potassium chloride  (K-DUR) 10 MEQ tablet Take 20 mEq by mouth 2 (two) times daily.     . pregabalin (LYRICA) 100 MG capsule Take 100 mg by mouth 2 (two) times daily.    . Prenatal Vit-Fe Fumarate-FA (MULTIVITAMIN-PRENATAL) 27-0.8 MG TABS tablet Take 1 tablet by mouth daily at 12 noon.    . promethazine (PHENERGAN) 12.5 MG tablet Take 2 tablets (25 mg total) by mouth every 6 (six) hours as needed for nausea or vomiting. 20 tablet 0  . propranolol (INDERAL) 40 MG tablet Take 80 mg by mouth 3 (three) times daily.     . risperiDONE (RISPERDAL) 3 MG tablet Take 3 mg by mouth daily.     . rosuvastatin (CRESTOR) 20 MG tablet Take 20 mg by mouth daily.    Marland Kitchen tiZANidine (ZANAFLEX) 4 MG tablet Take 4 mg by mouth 3 (three) times daily.   1  . Topiramate ER (TROKENDI XR) 200 MG CP24 Take 1 capsule by mouth daily.      No current facility-administered medications for this encounter.     ECOG PERFORMANCE STATUS:  0 - Asymptomatic  REVIEW OF SYSTEMS:  Patient denies any weight loss, fatigue, weakness, fever, chills or night sweats. Patient denies any loss of vision, blurred vision. Patient denies any ringing  of the ears or hearing loss. No irregular heartbeat. Patient denies heart murmur or history of fainting. Patient denies any chest pain or pain radiating to her upper extremities. Patient denies any shortness of breath, difficulty breathing at night, cough or hemoptysis. Patient denies any swelling in the lower legs. Patient denies any nausea vomiting, vomiting of blood, or coffee ground material in the vomitus. Patient denies any stomach pain. Patient states has had normal bowel movements no significant constipation or diarrhea. Patient denies any dysuria, hematuria or significant nocturia. Patient denies any problems walking, swelling in the joints or loss of balance. Patient denies any skin changes, loss of hair or loss of weight. Patient denies any excessive worrying or anxiety or significant depression. Patient denies any  problems with insomnia. Patient denies excessive thirst, polyuria, polydipsia. Patient denies any swollen glands, patient denies easy bruising or easy bleeding. Patient denies any recent infections, allergies or URI. Patient "s visual fields have not changed significantly in recent time.    PHYSICAL EXAM: BP (!) 136/92 (BP Location: Right Wrist, Patient Position: Sitting)   Pulse 92   Resp 16   Ht 5\' 10"  (1.778 m)   Wt (!) 371 lb 0.6 oz (168.3 kg)   LMP 08/02/2018   BMI 53.24 kg/m  Obese female in NAD. She has recent excision with Steri-Strips present on her right earlobe. No evidence of adenopathy in the head and neck region is noted.Well-developed well-nourished patient in NAD. HEENT reveals PERLA, EOMI, discs not visualized.  Oral cavity is clear. No oral mucosal lesions are identified. Neck is clear without evidence of cervical or supraclavicular adenopathy. Lungs are clear to A&P. Cardiac examination is essentially unremarkable with regular rate and rhythm without  murmur rub or thrill. Abdomen is benign with no organomegaly or masses noted. Motor sensory and DTR levels are equal and symmetric in the upper and lower extremities. Cranial nerves II through XII are grossly intact. Proprioception is intact. No peripheral adenopathy or edema is identified. No motor or sensory levels are noted. Crude visual fields are within normal range.  LABORATORY DATA: no lab data reviewed    RADIOLOGY RESULTS:no current films for review   IMPRESSION: excision of right earlobe keloid PLAN: this time I to go ahead with 1500 cGy in 3 fractions using electron beam. I have set her up for treatment planning simulation tomorrow and will start her treatments on Monday. Would ideally like to start treatments the day after her excision although I believe with the 1503 fractionation we can probably achieve 90% reduction in the keloid formation. Risks and benefits of treatment including skin reaction may be slight  fatigue all were discussed in detail with the patient and she is competent my treatment plan well.  I would like to take this opportunity to thank you for allowing me to participate in the care of your patient.Carmina Miller, MD

## 2018-08-23 ENCOUNTER — Ambulatory Visit: Payer: BLUE CROSS/BLUE SHIELD

## 2018-08-26 ENCOUNTER — Ambulatory Visit: Payer: Self-pay | Admitting: Dietician

## 2018-08-26 ENCOUNTER — Ambulatory Visit: Payer: BLUE CROSS/BLUE SHIELD

## 2018-08-26 ENCOUNTER — Ambulatory Visit
Admission: RE | Admit: 2018-08-26 | Discharge: 2018-08-26 | Disposition: A | Discharge status: Home / Self Care | Payer: BLUE CROSS/BLUE SHIELD | Source: Ambulatory Visit | Attending: Radiation Oncology | Admitting: Radiation Oncology

## 2018-08-26 DIAGNOSIS — L910 Hypertrophic scar: Secondary | ICD-10-CM | POA: Insufficient documentation

## 2018-08-26 DIAGNOSIS — Z510 Encounter for antineoplastic radiation therapy: Secondary | ICD-10-CM | POA: Insufficient documentation

## 2018-08-27 ENCOUNTER — Ambulatory Visit
Admission: RE | Admit: 2018-08-27 | Discharge: 2018-08-27 | Disposition: A | Discharge status: Home / Self Care | Payer: BLUE CROSS/BLUE SHIELD | Source: Ambulatory Visit | Attending: Radiation Oncology | Admitting: Radiation Oncology

## 2018-08-27 ENCOUNTER — Ambulatory Visit: Payer: BLUE CROSS/BLUE SHIELD

## 2018-08-27 DIAGNOSIS — Z510 Encounter for antineoplastic radiation therapy: Secondary | ICD-10-CM | POA: Insufficient documentation

## 2018-08-27 DIAGNOSIS — L910 Hypertrophic scar: Secondary | ICD-10-CM | POA: Insufficient documentation

## 2018-08-28 ENCOUNTER — Ambulatory Visit: Payer: BLUE CROSS/BLUE SHIELD

## 2018-08-28 ENCOUNTER — Ambulatory Visit
Admission: RE | Admit: 2018-08-28 | Discharge: 2018-08-28 | Disposition: A | Discharge status: Home / Self Care | Payer: BLUE CROSS/BLUE SHIELD | Source: Ambulatory Visit | Attending: Radiation Oncology | Admitting: Radiation Oncology

## 2018-08-28 DIAGNOSIS — L910 Hypertrophic scar: Secondary | ICD-10-CM | POA: Diagnosis not present

## 2018-08-29 ENCOUNTER — Ambulatory Visit
Admission: RE | Admit: 2018-08-29 | Discharge: 2018-08-29 | Disposition: A | Discharge status: Home / Self Care | Payer: BLUE CROSS/BLUE SHIELD | Source: Ambulatory Visit | Attending: Radiation Oncology | Admitting: Radiation Oncology

## 2018-08-29 DIAGNOSIS — L910 Hypertrophic scar: Secondary | ICD-10-CM | POA: Diagnosis not present

## 2018-09-04 ENCOUNTER — Other Ambulatory Visit: Payer: Self-pay

## 2018-09-04 ENCOUNTER — Emergency Department
Admission: EM | Admit: 2018-09-04 | Discharge: 2018-09-04 | Disposition: A | Discharge status: Home / Self Care | Payer: BLUE CROSS/BLUE SHIELD | Attending: Emergency Medicine | Admitting: Emergency Medicine

## 2018-09-04 DIAGNOSIS — I110 Hypertensive heart disease with heart failure: Secondary | ICD-10-CM | POA: Diagnosis not present

## 2018-09-04 DIAGNOSIS — R464 Slowness and poor responsiveness: Secondary | ICD-10-CM | POA: Diagnosis present

## 2018-09-04 DIAGNOSIS — I2510 Atherosclerotic heart disease of native coronary artery without angina pectoris: Secondary | ICD-10-CM | POA: Diagnosis not present

## 2018-09-04 DIAGNOSIS — R400 Somnolence: Secondary | ICD-10-CM | POA: Diagnosis not present

## 2018-09-04 DIAGNOSIS — R410 Disorientation, unspecified: Secondary | ICD-10-CM | POA: Insufficient documentation

## 2018-09-04 DIAGNOSIS — I5032 Chronic diastolic (congestive) heart failure: Secondary | ICD-10-CM | POA: Insufficient documentation

## 2018-09-04 DIAGNOSIS — J45909 Unspecified asthma, uncomplicated: Secondary | ICD-10-CM | POA: Insufficient documentation

## 2018-09-04 DIAGNOSIS — E119 Type 2 diabetes mellitus without complications: Secondary | ICD-10-CM | POA: Insufficient documentation

## 2018-09-04 DIAGNOSIS — Z79899 Other long term (current) drug therapy: Secondary | ICD-10-CM | POA: Diagnosis not present

## 2018-09-04 LAB — COMPREHENSIVE METABOLIC PANEL
ALT: 7 U/L (ref 0–44)
AST: 25 U/L (ref 15–41)
Albumin: UNDETERMINED g/dL (ref 3.5–5.0)
Alkaline Phosphatase: 60 U/L (ref 38–126)
Anion gap: 10 (ref 5–15)
BUN: UNDETERMINED mg/dL (ref 6–20)
CO2: 23 mmol/L (ref 22–32)
Calcium: 9.9 mg/dL (ref 8.9–10.3)
Chloride: 110 mmol/L (ref 98–111)
Creatinine, Ser: UNDETERMINED mg/dL (ref 0.44–1.00)
Glucose, Bld: 93 mg/dL (ref 70–99)
Potassium: 4.8 mmol/L (ref 3.5–5.1)
Sodium: 143 mmol/L (ref 135–145)
Total Bilirubin: 0.7 mg/dL (ref 0.3–1.2)
Total Protein: 7.6 g/dL (ref 6.5–8.1)

## 2018-09-04 LAB — URINE DRUG SCREEN, QUALITATIVE (ARMC ONLY)
Amphetamines, Ur Screen: NOT DETECTED
Barbiturates, Ur Screen: NOT DETECTED
Benzodiazepine, Ur Scrn: NOT DETECTED
Cannabinoid 50 Ng, Ur ~~LOC~~: NOT DETECTED
Cocaine Metabolite,Ur ~~LOC~~: NOT DETECTED
MDMA (Ecstasy)Ur Screen: NOT DETECTED
Methadone Scn, Ur: NOT DETECTED
Opiate, Ur Screen: NOT DETECTED
Phencyclidine (PCP) Ur S: NOT DETECTED
Tricyclic, Ur Screen: NOT DETECTED

## 2018-09-04 LAB — CBC WITH DIFFERENTIAL/PLATELET
Abs Immature Granulocytes: 0.06 10*3/uL (ref 0.00–0.07)
Basophils Absolute: 0 10*3/uL (ref 0.0–0.1)
Basophils Relative: 1 %
Eosinophils Absolute: 0.1 10*3/uL (ref 0.0–0.5)
Eosinophils Relative: 1 %
HCT: 40.1 % (ref 36.0–46.0)
Hemoglobin: 12.6 g/dL (ref 12.0–15.0)
Immature Granulocytes: 1 %
Lymphocytes Relative: 41 %
Lymphs Abs: 1.9 10*3/uL (ref 0.7–4.0)
MCH: 26.9 pg (ref 26.0–34.0)
MCHC: 31.4 g/dL (ref 30.0–36.0)
MCV: 85.5 fL (ref 80.0–100.0)
Monocytes Absolute: 0.6 10*3/uL (ref 0.1–1.0)
Monocytes Relative: 12 %
Neutro Abs: 2.1 10*3/uL (ref 1.7–7.7)
Neutrophils Relative %: 44 %
Platelets: 233 10*3/uL (ref 150–400)
RBC: 4.69 MIL/uL (ref 3.87–5.11)
RDW: 14.6 % (ref 11.5–15.5)
WBC: 4.8 10*3/uL (ref 4.0–10.5)
nRBC: 0 % (ref 0.0–0.2)

## 2018-09-04 LAB — URINALYSIS, COMPLETE (UACMP) WITH MICROSCOPIC
Bilirubin Urine: NEGATIVE
Glucose, UA: NEGATIVE mg/dL
Hgb urine dipstick: NEGATIVE
Ketones, ur: NEGATIVE mg/dL
Leukocytes, UA: NEGATIVE
Nitrite: POSITIVE — AB
Protein, ur: NEGATIVE mg/dL
Specific Gravity, Urine: 1.006 (ref 1.005–1.030)
pH: 6 (ref 5.0–8.0)

## 2018-09-04 LAB — PREGNANCY, URINE: Preg Test, Ur: NEGATIVE

## 2018-09-04 LAB — GLUCOSE, CAPILLARY: Glucose-Capillary: 82 mg/dL (ref 70–99)

## 2018-09-04 NOTE — ED Notes (Signed)
Lab in room.

## 2018-09-04 NOTE — ED Notes (Signed)
Dr. Derrill Kay is at bedside talking to the Pt about her discharge. Pt's mother states that she feels comfortable taking her home and watching her.

## 2018-09-04 NOTE — ED Notes (Signed)
IV access attempted by Brunetta Genera, Paulino Rily, and Jeannett Senior RN without success Tammy Sours RN notified for ultrasound IV

## 2018-09-04 NOTE — ED Notes (Signed)
RN called lab again and made lab aware that it has been an hour since we called and that we need a blood draw on the pt. Lab stated they would be here "shortly"

## 2018-09-04 NOTE — ED Notes (Signed)
Pt pulled emergency call bell in bathroom - she had climbed out the foot of the bed and walked to restroom - at this time she was unable to stand or take steps to get back to bed - pt bed was pushed into bathroom and with assistance by Jeannett Senior RN and Pattricia Boss RN the pt was stood and pivoted onto the bed without incident Pt continues to have slurred speech that is not comprehensible

## 2018-09-04 NOTE — ED Notes (Addendum)
Head of bed raised and pt scooted up in bed. Sprite and phone placed at bedside and pt reached for the phone and was able to hold it. While in the room pt was asked what she was doing when she fell out, pt states she doesn't remember what she was doing or even passing out. PT asked for graham crackers

## 2018-09-04 NOTE — ED Triage Notes (Signed)
Pt arrived via ems for report of syncope - pt was at [facility] and then went home - she was riding home in a car with no air conditioning and got hot so they pulled over for her to get some air and she "passed out" - EMS arrived and the pt speech was slurred with decreased LOC so they gave nasal Narcan and pt aroused - at this time pt pt has decreased LOC and severely slurred speech - she responds only when shaken and spoken loudly to - Dr Derrill Kay notified and states no medications at this time

## 2018-09-04 NOTE — Discharge Instructions (Addendum)
Please seek medical attention for any high fevers, chest pain, shortness of breath, change in behavior, persistent vomiting, bloody stool or any other new or concerning symptoms.  

## 2018-09-04 NOTE — ED Notes (Signed)
Per Dr Derrill Kay to offer pt fluids - pt states with slurred speech "sprite" when ask if she wants something to drink

## 2018-09-04 NOTE — ED Notes (Addendum)
Tammy Sours RN attempted IV ultrasound x3 without success Dr Derrill Kay is aware and gave VO for CBG

## 2018-09-04 NOTE — ED Provider Notes (Signed)
[facility] Emergency Department Provider Note   ____________________________________________   I have reviewed the triage vital signs and the nursing notes.   HISTORY  Chief Complaint Loss of Consciousness   History limited by: Not Limited   HPI Andrea Miller is a 32 y.o. female who presents to the emergency department today because of decreased responsiveness. Apparently friend noticed that the patient was more confused this morning upon awakening. She had then been tired throughout the day. Friend states that she drove the patient to the hospital for a radiation treatment but the patient was too tired to undergo it. When driving her back home she remained more tired. Friend called 911. Per report during transport she was given narcan and did wake up some.    Per medical record review patient has a history of chf, depression, seizures  Past Medical History:  Diagnosis Date  . Anxiety   . Asthma   . Bipolar disorder (HCC)   . Chest pain    and angina  . CHF (congestive heart failure) (HCC)   . Depression   . Fibromyalgia   . Hypertension   . 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 Obesity   . Pre-diabetes   . Seizures (HCC)    secondary to anxiety  last seizure 01/25/18  . SVT (supraventricular tachycardia) (HCC)    with hx of syncope; managed by [facility]    Patient Active Problem List   Diagnosis Date Noted  . Chronic diastolic heart failure (HCC) 07/01/2018  . Lymphedema 07/01/2018  . Chronic cholecystitis   . Edema 02/13/2018  . Diabetes mellitus type 2, uncomplicated (HCC) 02/13/2018  . Hypertension 11/23/2017  . Asthma exacerbation 04/14/2017  . Asthma without status asthmaticus 01/16/2017  . Bipolar affective (HCC) 01/16/2017  . Coronary artery disease 01/16/2017  . Syncope 12/12/2016  . Fibromyalgia 04/27/2014  . OSA (obstructive sleep apnea) 04/27/2014  . Seizure-like activity (HCC) 04/27/2014   . Migraine without status migrainosus, not intractable 04/27/2014  . Obesity 04/15/2014  . Sleep disorder 04/15/2014  . Neck pain 04/15/2014  . Headache 04/15/2014  . Restless legs syndrome 04/15/2014  . Supraventricular tachycardia (HCC) 07/13/2009    Past Surgical History:  Procedure Laterality Date  . CHOLECYSTECTOMY N/A 02/22/2018   Procedure: LAPAROSCOPIC CHOLECYSTECTOMY;  Surgeon: Ancil Linsey, MD;  Location: [facility] ORS;  Service: General;  Laterality: N/A;  . TONSILLECTOMY AND ADENOIDECTOMY N/A 09/05/2016   Procedure: TONSILLECTOMY AND ADENOIDECTOMY;  Surgeon: Linus Salmons, MD;  Location: [facility] ORS;  Service: ENT;  Laterality: N/A;    Prior to Admission medications   Medication Sig Start Date End Date Taking? Authorizing Provider  albuterol (PROVENTIL HFA;VENTOLIN HFA) 108 (90 Base) MCG/ACT inhaler Inhale 2 puffs into the lungs every 6 (six) hours as needed for wheezing or shortness of breath. 07/31/18   Merwyn Katos, MD  albuterol (PROVENTIL) (2.5 MG/3ML) 0.083% nebulizer solution Take 2.5 mg by nebulization every 4 (four) hours as needed for wheezing or shortness of breath.    [provider]  budesonide-formoterol (SYMBICORT) 160-4.5 MCG/ACT inhaler Inhale 2 puffs into the lungs 2 (two) times daily. Patient taking differently: Inhale 1 puff into the lungs 2 (two) times daily.  01/31/18   Merwyn Katos, MD  Calcium-Magnesium 500-250 MG TABS Take 1 tablet by mouth 2 (two) times daily.     [provider]  clonazePAM (KLONOPIN) 2 MG tablet Take 2 mg by mouth 3 (three) times daily.  [provider]  diltiazem (CARDIZEM CD) 360 MG 24 hr capsule Take 360 mg by mouth daily.     [provider]  escitalopram (LEXAPRO) 20 MG tablet Take 20 mg by mouth daily.    [provider]  fluticasone (FLONASE) 50 MCG/ACT nasal spray Place 2 sprays into both nostrils daily as needed for allergies.     [provider]  folic acid  (FOLVITE) 1 MG tablet Take 1 mg by mouth daily.  12/15/16   [provider]  furosemide (LASIX) 20 MG tablet Take 40 mg by mouth 2 (two) times daily.     [provider]  ibuprofen (ADVIL,MOTRIN) 600 MG tablet Take 600 mg by mouth every 6 (six) hours as needed for moderate pain.     [provider]  lamoTRIgine (LAMICTAL) 100 MG tablet Take 200 mg by mouth 2 (two) times daily.     [provider]  linaclotide (LINZESS) 290 MCG CAPS capsule Take 290 mcg by mouth daily before breakfast.    [provider]  montelukast (SINGULAIR) 10 MG tablet Take 1 tablet (10 mg total) by mouth at bedtime. 01/31/18 01/31/19  Merwyn Katos, MD  norethindrone (MICRONOR,CAMILA,ERRIN) 0.35 MG tablet Take 1 tablet by mouth daily.    [provider]  orlistat (XENICAL) 120 MG capsule Take 120 mg by mouth 3 (three) times daily with meals.    [provider]  potassium chloride (K-DUR) 10 MEQ tablet Take 20 mEq by mouth 2 (two) times daily.     [provider]  pregabalin (LYRICA) 100 MG capsule Take 100 mg by mouth 2 (two) times daily.    [provider]  Prenatal Vit-Fe Fumarate-FA (MULTIVITAMIN-PRENATAL) 27-0.8 MG TABS tablet Take 1 tablet by mouth daily at 12 noon.    [provider]  promethazine (PHENERGAN) 12.5 MG tablet Take 2 tablets (25 mg total) by mouth every 6 (six) hours as needed for nausea or vomiting. 02/20/18   Governor Rooks, MD  propranolol (INDERAL) 40 MG tablet Take 80 mg by mouth 3 (three) times daily.     [provider]  risperiDONE (RISPERDAL) 3 MG tablet Take 3 mg by mouth daily.     [provider]  rosuvastatin (CRESTOR) 20 MG tablet Take 20 mg by mouth daily.    [provider]  tiZANidine (ZANAFLEX) 4 MG tablet Take 4 mg by mouth 3 (three) times daily.  03/26/17   [provider]  Topiramate ER (TROKENDI XR) 200 MG CP24 Take 1 capsule by mouth daily.     [provider]    Allergies Cortizone-10 [hydrocortisone]; Garlic; Prednisone; and Corticosteroids  Family History  Problem Relation Age of Onset  . Diabetes Mother   . Hypertension Mother   . Asthma Mother   . Gallstones Mother   . Diabetes Father   . Hypertension Father   . Gallstones Father     Social History Social History   Tobacco Use  . Smoking status: Never Smoker  . Smokeless tobacco: Never Used  Substance Use Topics  . Alcohol use: No  . Drug use: No    Review of Systems Constitutional: No fever/chills. Positive for fatigue Eyes: No visual changes. ENT: No sore throat. Cardiovascular: Denies chest pain. Respiratory: Denies shortness of breath. Gastrointestinal: No abdominal pain.  No nausea, no vomiting.  No diarrhea.   Genitourinary: Negative for dysuria. Musculoskeletal: Negative for back pain. Skin: Negative for rash. Neurological: Negative for headaches, focal weakness or  numbness.  ____________________________________________   PHYSICAL EXAM:  VITAL SIGNS: ED Triage Vitals  Enc Vitals Group     BP 09/04/18 1616 113/60     Pulse Rate 09/04/18 1616 72     Resp 09/04/18 1616 15     Temp 09/04/18 1616 98 F (36.7 C)     Temp Source 09/04/18 1616 Axillary     SpO2 09/04/18 1616 96 %     Weight 09/04/18 1610 300 lb (136.1 kg)     Height 09/04/18 1610 5\' 6"  (1.676 m)   Constitutional: Alert and oriented.  Eyes: Conjunctivae are normal.  ENT      Head: Normocephalic and atraumatic.      Nose: No congestion/rhinnorhea.      Mouth/Throat: Mucous membranes are moist.      Neck: No stridor. Hematological/Lymphatic/Immunilogical: No cervical lymphadenopathy. Cardiovascular: Normal rate, regular rhythm.  No murmurs, rubs, or gallops.  Respiratory: Normal respiratory effort without tachypnea nor retractions. Breath sounds are clear and equal bilaterally. No wheezes/rales/rhonchi. Gastrointestinal: Soft and non tender. No rebound. No guarding.  Genitourinary:  Deferred Musculoskeletal: Normal range of motion in all extremities. No lower extremity edema. Neurologic:  Normal speech and language. No gross focal neurologic deficits are appreciated.  Skin:  Skin is warm, dry and intact. No rash noted. Psychiatric: Mood and affect are normal. Speech and behavior are normal. Patient exhibits appropriate insight and judgment.  ____________________________________________    LABS (pertinent positives/negatives)  Upreg neg UDS negative UA positive nitrite, rare bacteria otherwsie unremarkable CMP na 143, k 4.8, glu 93 CBC wbc 4.8, hgb 12.6, plt 233  ____________________________________________   EKG  None  ____________________________________________    RADIOLOGY  None  ____________________________________________   PROCEDURES  Procedures  ____________________________________________   INITIAL IMPRESSION / ASSESSMENT AND PLAN / ED COURSE  Pertinent labs & imaging results that were available during my care of the patient were reviewed by me and considered in my medical decision making (see chart for details).   Presented to the emergency department today because of concerns for decreased responsiveness.  Differential would be broad.  Patient was observed in the emergency department for a number of hours.  Patient did have any concerning findings on blood work.  It does appear that the patient is on a number of different sedating medications.  Given that the patient did gradually become more responsive through her stay here in the emergency department I do wonder if polypharmacy could explain the patient's presentation.  Did discuss this with patient and family.  Patient was observed until she was able to ambulate.  Will plan on discharging.  ____________________________________________   FINAL CLINICAL IMPRESSION(S) / ED DIAGNOSES  Final diagnoses:  Somnolence  Polypharmacy     Note: This dictation was prepared with Dragon  dictation. Any transcriptional errors that result from this process are unintentional     Phineas Semen, MD 09/04/18 2338

## 2018-09-04 NOTE — ED Notes (Signed)
Per Dr. Derrill Kay, Lab called for blood draw

## 2018-09-04 NOTE — ED Notes (Signed)
Pt states does not have to urinate at this time 

## 2018-09-04 NOTE — ED Notes (Signed)
Pt ambulated to the beside commode with the help of RN, ED tech and family member. Pt returned to her bed.

## 2018-09-06 ENCOUNTER — Other Ambulatory Visit: Payer: Self-pay | Admitting: Neurology

## 2018-09-06 DIAGNOSIS — G35 Multiple sclerosis: Secondary | ICD-10-CM

## 2018-09-06 DIAGNOSIS — Z0000 Encounter for general adult medical examination without abnormal findings: Secondary | ICD-10-CM

## 2018-09-13 ENCOUNTER — Encounter: Payer: BLUE CROSS/BLUE SHIELD | Attending: Nurse Practitioner

## 2018-09-13 NOTE — Progress Notes (Signed)
Pre-Operative Nutrition Class:  Appt start time: 0900   End time:  1100  Patient was seen on 09/13/18 for Pre-Operative Bariatric Surgery Education at [facility].   Surgery date: TBD Surgery type: Gastric Bypass  Start weight at [facility]: 367.3lbs Weight today: 361.2lbs  InBody  BODY COMP RESULTS    BMI (kg/m^2) 53.3  Fat Mass (lbs) 195.4  Fat Free Mass (lbs) 43.9  Total Body Water (lbs) 121.9   Samples given per MNT protocol. Patient educated on appropriate usage:  Celebrate Vitamins Multivitamin  Lot # [id number] Exp: 03/2019  Celebrate Vitamins Calcium Citrate Lot # [id number]  Exp: 03/2019  Premier Protein Clear, Fruit punch  Lot# [id number] Exp: 02/13/2019  Premier Protein Vanilla shake  Lot# [id number] Exp: 02/01/2019  Renee Pain Protein Powder  Lot # [id number],  Exp: 08/2019  Lot# [id number],  Exp: 05/2019 Lot# [id number],  Exp: 05/2019  The following the learning objectives were met by the patient during this course:  Identify Pre-Op Dietary Goals and will begin 2 weeks pre-operatively  Identify appropriate sources of fluids and proteins   State protein recommendations and appropriate sources pre and post-operatively  Identify Post-Operative Dietary Goals and will follow for 2 weeks post-operatively  Identify appropriate multivitamin and calcium sources  Describe the need for physical activity post-operatively and will follow MD recommendations  State when to call healthcare provider regarding medication questions or post-operative complications  Handouts given during class include:  Pre-Op Bariatric Surgery Diet Handout  Protein Shake Handout  Post-Op Bariatric Surgery Nutrition Handout  BEL[facility] Program Information Flyer  Support Group Information Flyer  [facility] Bariatric Supplements Price List  Follow-Up Plan: Patient will follow-up at [place], at about 2 weeks post operatively for diet advancement per MD.

## 2018-09-15 ENCOUNTER — Emergency Department (HOSPITAL_COMMUNITY): Payer: BLUE CROSS/BLUE SHIELD

## 2018-09-15 ENCOUNTER — Other Ambulatory Visit: Payer: Self-pay

## 2018-09-15 ENCOUNTER — Encounter (HOSPITAL_COMMUNITY): Payer: Self-pay | Admitting: Emergency Medicine

## 2018-09-15 ENCOUNTER — Observation Stay (HOSPITAL_COMMUNITY)
Admission: EM | Admit: 2018-09-15 | Discharge: 2018-09-16 | Disposition: A | Discharge status: Home / Self Care | Payer: BLUE CROSS/BLUE SHIELD | Attending: Internal Medicine | Admitting: Internal Medicine

## 2018-09-15 DIAGNOSIS — R531 Weakness: Secondary | ICD-10-CM | POA: Diagnosis not present

## 2018-09-15 DIAGNOSIS — I890 Lymphedema, not elsewhere classified: Secondary | ICD-10-CM | POA: Insufficient documentation

## 2018-09-15 DIAGNOSIS — G4733 Obstructive sleep apnea (adult) (pediatric): Secondary | ICD-10-CM | POA: Insufficient documentation

## 2018-09-15 DIAGNOSIS — I471 Supraventricular tachycardia: Secondary | ICD-10-CM | POA: Diagnosis not present

## 2018-09-15 DIAGNOSIS — R42 Dizziness and giddiness: Secondary | ICD-10-CM | POA: Insufficient documentation

## 2018-09-15 DIAGNOSIS — F419 Anxiety disorder, unspecified: Secondary | ICD-10-CM | POA: Diagnosis not present

## 2018-09-15 DIAGNOSIS — R262 Difficulty in walking, not elsewhere classified: Secondary | ICD-10-CM | POA: Insufficient documentation

## 2018-09-15 DIAGNOSIS — I2699 Other pulmonary embolism without acute cor pulmonale: Secondary | ICD-10-CM | POA: Diagnosis not present

## 2018-09-15 DIAGNOSIS — Z888 Allergy status to other drugs, medicaments and biological substances status: Secondary | ICD-10-CM | POA: Insufficient documentation

## 2018-09-15 DIAGNOSIS — I110 Hypertensive heart disease with heart failure: Secondary | ICD-10-CM | POA: Diagnosis not present

## 2018-09-15 DIAGNOSIS — Z6841 Body Mass Index (BMI) 40.0 and over, adult: Secondary | ICD-10-CM | POA: Diagnosis not present

## 2018-09-15 DIAGNOSIS — W19XXXA Unspecified fall, initial encounter: Secondary | ICD-10-CM

## 2018-09-15 DIAGNOSIS — F319 Bipolar disorder, unspecified: Secondary | ICD-10-CM | POA: Insufficient documentation

## 2018-09-15 DIAGNOSIS — M25561 Pain in right knee: Secondary | ICD-10-CM | POA: Insufficient documentation

## 2018-09-15 DIAGNOSIS — R51 Headache: Secondary | ICD-10-CM | POA: Insufficient documentation

## 2018-09-15 DIAGNOSIS — I2694 Multiple subsegmental pulmonary emboli without acute cor pulmonale: Secondary | ICD-10-CM | POA: Diagnosis present

## 2018-09-15 DIAGNOSIS — I5032 Chronic diastolic (congestive) heart failure: Secondary | ICD-10-CM | POA: Diagnosis not present

## 2018-09-15 DIAGNOSIS — I10 Essential (primary) hypertension: Secondary | ICD-10-CM | POA: Diagnosis present

## 2018-09-15 DIAGNOSIS — R918 Other nonspecific abnormal finding of lung field: Secondary | ICD-10-CM | POA: Diagnosis not present

## 2018-09-15 DIAGNOSIS — M797 Fibromyalgia: Secondary | ICD-10-CM | POA: Diagnosis not present

## 2018-09-15 DIAGNOSIS — Z23 Encounter for immunization: Secondary | ICD-10-CM | POA: Insufficient documentation

## 2018-09-15 DIAGNOSIS — J45909 Unspecified asthma, uncomplicated: Secondary | ICD-10-CM | POA: Insufficient documentation

## 2018-09-15 DIAGNOSIS — R0789 Other chest pain: Secondary | ICD-10-CM | POA: Insufficient documentation

## 2018-09-15 DIAGNOSIS — R7303 Prediabetes: Secondary | ICD-10-CM | POA: Diagnosis not present

## 2018-09-15 DIAGNOSIS — Z79899 Other long term (current) drug therapy: Secondary | ICD-10-CM | POA: Diagnosis not present

## 2018-09-15 DIAGNOSIS — M545 Low back pain: Secondary | ICD-10-CM | POA: Insufficient documentation

## 2018-09-15 LAB — CBC
HCT: 44.5 % (ref 36.0–46.0)
Hemoglobin: 13.2 g/dL (ref 12.0–15.0)
MCH: 26 pg (ref 26.0–34.0)
MCHC: 29.7 g/dL — ABNORMAL LOW (ref 30.0–36.0)
MCV: 87.8 fL (ref 80.0–100.0)
Platelets: 233 10*3/uL (ref 150–400)
RBC: 5.07 MIL/uL (ref 3.87–5.11)
RDW: 14.6 % (ref 11.5–15.5)
WBC: 5.3 10*3/uL (ref 4.0–10.5)
nRBC: 0 % (ref 0.0–0.2)

## 2018-09-15 LAB — BASIC METABOLIC PANEL
Anion gap: 6 (ref 5–15)
BUN: 14 mg/dL (ref 6–20)
CO2: 26 mmol/L (ref 22–32)
Calcium: 9.3 mg/dL (ref 8.9–10.3)
Chloride: 109 mmol/L (ref 98–111)
Creatinine, Ser: 0.92 mg/dL (ref 0.44–1.00)
GFR calc Af Amer: >60 mL/min (ref >60)
GFR calc non Af Amer: >60 mL/min (ref >60)
Glucose, Bld: 99 mg/dL (ref 70–99)
Potassium: 3.8 mmol/L (ref 3.5–5.1)
Sodium: 141 mmol/L (ref 135–145)

## 2018-09-15 LAB — RAPID URINE DRUG SCREEN, HOSP PERFORMED
Amphetamines: NOT DETECTED
Barbiturates: NOT DETECTED
Benzodiazepines: NOT DETECTED
Cocaine: NOT DETECTED
Opiates: NOT DETECTED
Tetrahydrocannabinol: NOT DETECTED

## 2018-09-15 LAB — HEPARIN LEVEL (UNFRACTIONATED): Heparin Unfractionated: 0.85 IU/mL — ABNORMAL HIGH (ref 0.30–0.70)

## 2018-09-15 LAB — I-STAT BETA HCG BLOOD, ED (MC, WL, AP ONLY): I-stat hCG, quantitative: <5 m[IU]/mL (ref <5)

## 2018-09-15 LAB — I-STAT TROPONIN, ED: Troponin i, poc: 0 ng/mL (ref 0.00–0.08)

## 2018-09-15 LAB — PROTIME-INR
INR: 0.97
Prothrombin Time: 12.8 seconds (ref 11.4–15.2)

## 2018-09-15 LAB — D-DIMER, QUANTITATIVE: D-Dimer, Quant: 1.42 ug/mL-FEU — ABNORMAL HIGH (ref 0.00–0.50)

## 2018-09-15 MED ORDER — MONTELUKAST SODIUM 10 MG PO TABS
10.0000 mg | ORAL_TABLET | Freq: Every day | ORAL | Status: DC
  Administered 2018-09-15: 10 mg via ORAL
  Filled 2018-09-15: qty 1

## 2018-09-15 MED ORDER — POTASSIUM CHLORIDE CRYS ER 20 MEQ PO TBCR
20.0000 meq | EXTENDED_RELEASE_TABLET | Freq: Two times a day (BID) | ORAL | Status: DC
  Administered 2018-09-15 – 2018-09-16 (×2): 20 meq via ORAL
  Filled 2018-09-15: qty 2
  Filled 2018-09-15: qty 1
  Filled 2018-09-15 (×2): qty 2

## 2018-09-15 MED ORDER — LAMOTRIGINE 100 MG PO TABS
200.0000 mg | ORAL_TABLET | Freq: Two times a day (BID) | ORAL | Status: DC
  Administered 2018-09-15: 200 mg via ORAL
  Filled 2018-09-15 (×2): qty 2

## 2018-09-15 MED ORDER — MECLIZINE HCL 12.5 MG PO TABS: 25.0000 mg | ORAL_TABLET | Freq: Four times a day (QID) | ORAL | Status: DC | PRN

## 2018-09-15 MED ORDER — FOLIC ACID 1 MG PO TABS
1.0000 mg | ORAL_TABLET | Freq: Every day | ORAL | Status: DC
  Administered 2018-09-16: 1 mg via ORAL
  Filled 2018-09-15: qty 1

## 2018-09-15 MED ORDER — PRENATAL MULTIVITAMIN CH
1.0000 | ORAL_TABLET | Freq: Every day | ORAL | Status: DC
  Administered 2018-09-16: 1 via ORAL
  Filled 2018-09-15 (×3): qty 1

## 2018-09-15 MED ORDER — HEPARIN SODIUM (PORCINE) 5000 UNIT/ML IJ SOLN: 5000.0000 [IU] | Freq: Three times a day (TID) | INTRAMUSCULAR | Status: DC

## 2018-09-15 MED ORDER — PROPRANOLOL HCL 20 MG PO TABS
80.0000 mg | ORAL_TABLET | Freq: Three times a day (TID) | ORAL | Status: DC
  Administered 2018-09-15 – 2018-09-16 (×2): 80 mg via ORAL
  Filled 2018-09-15 (×3): qty 4

## 2018-09-15 MED ORDER — SODIUM CHLORIDE 0.9% FLUSH
3.0000 mL | Freq: Two times a day (BID) | INTRAVENOUS | Status: DC
  Administered 2018-09-15: 3 mL via INTRAVENOUS

## 2018-09-15 MED ORDER — ACETAMINOPHEN 650 MG RE SUPP: 650.0000 mg | Freq: Four times a day (QID) | RECTAL | Status: DC | PRN

## 2018-09-15 MED ORDER — CALCIUM-MAGNESIUM 500-250 MG PO TABS: 1.0000 | ORAL_TABLET | Freq: Two times a day (BID) | ORAL | Status: DC

## 2018-09-15 MED ORDER — FLUTICASONE PROPIONATE 50 MCG/ACT NA SUSP: 2.0000 | Freq: Every day | NASAL | Status: DC | PRN

## 2018-09-15 MED ORDER — PRENATAL 27-0.8 MG PO TABS: 1.0000 | ORAL_TABLET | Freq: Every day | ORAL | Status: DC

## 2018-09-15 MED ORDER — ACETAMINOPHEN 325 MG PO TABS: 650.0000 mg | ORAL_TABLET | Freq: Four times a day (QID) | ORAL | Status: DC | PRN

## 2018-09-15 MED ORDER — ONDANSETRON HCL 4 MG/2ML IJ SOLN
4.0000 mg | Freq: Once | INTRAMUSCULAR | Status: AC
  Administered 2018-09-15: 4 mg via INTRAVENOUS
  Filled 2018-09-15: qty 2

## 2018-09-15 MED ORDER — RISPERIDONE 1 MG PO TABS: 3.0000 mg | ORAL_TABLET | Freq: Every day | ORAL | Status: DC

## 2018-09-15 MED ORDER — PREGABALIN 50 MG PO CAPS
100.0000 mg | ORAL_CAPSULE | Freq: Two times a day (BID) | ORAL | Status: DC
  Administered 2018-09-15 – 2018-09-16 (×2): 100 mg via ORAL
  Filled 2018-09-15 (×2): qty 2

## 2018-09-15 MED ORDER — ACETAMINOPHEN 325 MG PO TABS
650.0000 mg | ORAL_TABLET | Freq: Once | ORAL | Status: AC
  Administered 2018-09-15: 650 mg via ORAL
  Filled 2018-09-15: qty 2

## 2018-09-15 MED ORDER — CLONAZEPAM 0.5 MG PO TABS: 2.0000 mg | ORAL_TABLET | Freq: Three times a day (TID) | ORAL | Status: DC

## 2018-09-15 MED ORDER — ALBUTEROL SULFATE (2.5 MG/3ML) 0.083% IN NEBU: 2.5000 mg | INHALATION_SOLUTION | RESPIRATORY_TRACT | Status: DC | PRN

## 2018-09-15 MED ORDER — LACTULOSE 10 GM/15ML PO SOLN: 30.0000 g | Freq: Every day | ORAL | Status: DC | PRN

## 2018-09-15 MED ORDER — LINACLOTIDE 145 MCG PO CAPS
290.0000 ug | ORAL_CAPSULE | Freq: Every day | ORAL | Status: DC
  Filled 2018-09-15: qty 2

## 2018-09-15 MED ORDER — INFLUENZA VAC SPLIT QUAD 0.5 ML IM SUSY
0.5000 mL | PREFILLED_SYRINGE | INTRAMUSCULAR | Status: AC
  Administered 2018-09-16: 0.5 mL via INTRAMUSCULAR
  Filled 2018-09-15: qty 0.5

## 2018-09-15 MED ORDER — SODIUM CHLORIDE 0.9 % IV SOLN: 250.0000 mL | INTRAVENOUS | Status: DC | PRN

## 2018-09-15 MED ORDER — HEPARIN BOLUS VIA INFUSION
6000.0000 [IU] | Freq: Once | INTRAVENOUS | Status: AC
  Administered 2018-09-15: 60 [IU] via INTRAVENOUS

## 2018-09-15 MED ORDER — ROSUVASTATIN CALCIUM 20 MG PO TABS
20.0000 mg | ORAL_TABLET | Freq: Every day | ORAL | Status: DC
  Administered 2018-09-16: 20 mg via ORAL
  Filled 2018-09-15: qty 1

## 2018-09-15 MED ORDER — ESCITALOPRAM OXALATE 10 MG PO TABS
20.0000 mg | ORAL_TABLET | Freq: Every day | ORAL | Status: DC
  Administered 2018-09-16: 20 mg via ORAL
  Filled 2018-09-15: qty 2

## 2018-09-15 MED ORDER — FUROSEMIDE 40 MG PO TABS
40.0000 mg | ORAL_TABLET | Freq: Two times a day (BID) | ORAL | Status: DC
  Administered 2018-09-16: 40 mg via ORAL
  Filled 2018-09-15 (×2): qty 1

## 2018-09-15 MED ORDER — HEPARIN (PORCINE) IN NACL 100-0.45 UNIT/ML-% IJ SOLN
1300.0000 [IU]/h | INTRAMUSCULAR | Status: DC
  Administered 2018-09-15: 1700 [IU]/h via INTRAVENOUS
  Administered 2018-09-16: 1500 [IU]/h via INTRAVENOUS
  Filled 2018-09-15 (×2): qty 250

## 2018-09-15 MED ORDER — IOPAMIDOL (ISOVUE-370) INJECTION 76%
100.0000 mL | Freq: Once | INTRAVENOUS | Status: AC | PRN
  Administered 2018-09-15: 100 mL via INTRAVENOUS

## 2018-09-15 MED ORDER — PROMETHAZINE HCL 12.5 MG PO TABS: 25.0000 mg | ORAL_TABLET | Freq: Four times a day (QID) | ORAL | Status: DC | PRN

## 2018-09-15 MED ORDER — ORLISTAT 120 MG PO CAPS: 120.0000 mg | ORAL_CAPSULE | Freq: Three times a day (TID) | ORAL | Status: DC

## 2018-09-15 MED ORDER — ONDANSETRON HCL 4 MG PO TABS: 4.0000 mg | ORAL_TABLET | Freq: Four times a day (QID) | ORAL | Status: DC | PRN

## 2018-09-15 MED ORDER — TRAZODONE HCL 50 MG PO TABS: 50.0000 mg | ORAL_TABLET | Freq: Every evening | ORAL | Status: DC | PRN

## 2018-09-15 MED ORDER — RISPERIDONE 1 MG PO TABS
3.0000 mg | ORAL_TABLET | Freq: Every day | ORAL | Status: DC
  Administered 2018-09-15: 3 mg via ORAL
  Filled 2018-09-15: qty 3

## 2018-09-15 MED ORDER — OXYCODONE HCL 5 MG PO TABS
10.0000 mg | ORAL_TABLET | Freq: Once | ORAL | Status: DC
  Filled 2018-09-15: qty 2

## 2018-09-15 MED ORDER — TIZANIDINE HCL 4 MG PO TABS
4.0000 mg | ORAL_TABLET | Freq: Three times a day (TID) | ORAL | Status: DC
  Administered 2018-09-15 – 2018-09-16 (×2): 4 mg via ORAL
  Filled 2018-09-15 (×2): qty 1

## 2018-09-15 MED ORDER — PANTOPRAZOLE SODIUM 40 MG PO TBEC
40.0000 mg | DELAYED_RELEASE_TABLET | Freq: Every day | ORAL | Status: DC
  Administered 2018-09-16: 40 mg via ORAL
  Filled 2018-09-15: qty 1

## 2018-09-15 MED ORDER — MOMETASONE FURO-FORMOTEROL FUM 200-5 MCG/ACT IN AERO
2.0000 | INHALATION_SPRAY | Freq: Two times a day (BID) | RESPIRATORY_TRACT | Status: DC
  Administered 2018-09-16: 2 via RESPIRATORY_TRACT
  Filled 2018-09-15: qty 8.8

## 2018-09-15 MED ORDER — DILTIAZEM HCL ER COATED BEADS 180 MG PO CP24
360.0000 mg | ORAL_CAPSULE | Freq: Every day | ORAL | Status: DC
  Administered 2018-09-16: 360 mg via ORAL
  Filled 2018-09-15: qty 2

## 2018-09-15 MED ORDER — ALBUTEROL SULFATE HFA 108 (90 BASE) MCG/ACT IN AERS: 2.0000 | INHALATION_SPRAY | Freq: Four times a day (QID) | RESPIRATORY_TRACT | Status: DC | PRN

## 2018-09-15 MED ORDER — TOPIRAMATE ER 200 MG PO CAP24: 1.0000 | ORAL_CAPSULE | Freq: Every day | ORAL | Status: DC

## 2018-09-15 MED ORDER — POLYETHYLENE GLYCOL 3350 17 G PO PACK: 17.0000 g | PACK | Freq: Every day | ORAL | Status: DC | PRN

## 2018-09-15 MED ORDER — SODIUM CHLORIDE 0.9% FLUSH: 3.0000 mL | INTRAVENOUS | Status: DC | PRN

## 2018-09-15 MED ORDER — CLONAZEPAM 0.5 MG PO TABS
2.0000 mg | ORAL_TABLET | Freq: Three times a day (TID) | ORAL | Status: DC | PRN
  Administered 2018-09-15: 2 mg via ORAL
  Filled 2018-09-15: qty 4

## 2018-09-15 MED ORDER — ONDANSETRON HCL 4 MG/2ML IJ SOLN: 4.0000 mg | Freq: Four times a day (QID) | INTRAMUSCULAR | Status: DC | PRN

## 2018-09-15 NOTE — ED Provider Notes (Signed)
Medical screening examination/treatment/procedure(s) were conducted as a shared visit with non-physician practitioner(s) and myself.  I personally evaluated the patient during the encounter.  Clinical Impression:   Final diagnoses:  Pulmonary embolism, bilateral (HCC)    The patient is an obese 32 year old female, she has multiple medical problems including a history of SVT and positional tachycardia for which she is followed by a cardiologist in [place].  She presents today from home after she had a fall down the stairs of her front porch, she stood up quickly walked to the front door to look outside, felt her heart racing followed by falling down the stairs.  She had to crawl back into the house and then called for transport.  On exam the patient has pain in a variety of different areas, she has pain in her lower back, her legs and her neck, there is no signs of head injury, no signs of abrasions or injuries to the skin, she is able to straight leg raise both legs approximately 5 or 6 inches off the bed.  She states that she has normal sensation when I examined her legs.  Her heart and lung exams are normal, there is no arrhythmia and the EKG is unremarkable.  She is already followed for this intermittent positional tachycardia and is being treated with both a calcium channel blocker and beta-blocker, with a normal EKG and exam I see no reason to be more aggressive about this.  Rule out traumatic injury as well as PE as the pt has had some ongoing SOB and CP  Patient agreeable  {Pt is critically ill with a PE - needs anticoagulation - CC provided   Eber Hong, MD 09/16/18 1450

## 2018-09-15 NOTE — Progress Notes (Signed)
ANTICOAGULATION CONSULT NOTE - Initial Consult  Pharmacy Consult for Heparin Indication: pulmonary embolus  Allergies  Allergen Reactions  . Cortizone-10 [Hydrocortisone] Shortness Of Breath and Swelling  . Garlic Anaphylaxis  . Prednisone Swelling    Tongue swelling  . Corticosteroids Palpitations    Patient Measurements: Height: 5\' 9"  (175.3 cm) Weight: (!) 360 lb (163.3 kg) IBW/kg (Calculated) : 66.2 HEPARIN DW (KG): 106.9  Vital Signs: Temp: 98.5 F (36.9 C) (10/20 1229) Temp Source: Oral (10/20 1229) BP: 134/72 (10/20 1229) Pulse Rate: 74 (10/20 1229)  Labs: Recent Labs    09/15/18 1340  HGB 13.2  HCT 44.5  PLT 233  CREATININE 0.92    Estimated Creatinine Clearance: 148.2 mL/min (by C-G formula based on SCr of 0.92 mg/dL).   Medical History: Past Medical History:  Diagnosis Date  . Anxiety   . Asthma   . Bipolar disorder (HCC)   . Chest pain    and angina  . CHF (congestive heart failure) (HCC)   . Depression   . Fibromyalgia   . Hypertension   . 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 Obesity   . Pre-diabetes   . Seizures (HCC)    secondary to anxiety  last seizure 01/25/18  . SVT (supraventricular tachycardia) (HCC)    with hx of syncope; managed by [facility]    Medications:  See med rec  Assessment: 32 yo obese female presents today with chest pain,  tachycardia and pain in her lower back, legs and neck. She fell down the stairs and felt her heart racing.  She also had dyspnea. She had a CT angiogram which showed small bilateral pulmonary emboli. In addition, the patient is on BCP's (norethindrone).  Pharmacy asked to start heparin.  Goal of Therapy:  Heparin level 0.3-0.7 units/ml Monitor platelets by anticoagulation protocol: Yes   Plan:  Give 6000 units bolus x 1 Start heparin infusion at 1700 units/hr Check anti-Xa level in 6-8 hours and daily while on heparin Continue to monitor H&H and platelets    Elder Cyphers, BS Loura Back, BCPS Clinical Pharmacist Pager [phone] 09/15/2018,4:19 PM

## 2018-09-15 NOTE — ED Triage Notes (Addendum)
Patient c/o mid-sternal chest pain that radiates to left side of chest. Per patient started this morning. Shortness of breath, nausea, dizziness and weakness reported. Denies any vomiting. Per patient fell down stairs this morning due to weakness and dizzness. Reports hitting head, denies LOC. Per patient headache and neck pain before fall. Patient states hx of SVT.

## 2018-09-15 NOTE — ED Provider Notes (Signed)
[facility] EMERGENCY DEPARTMENT Provider Note   CSN: [id number] Arrival date & time: 09/15/18  1214     History   Chief Complaint Chief Complaint  Patient presents with  . Chest Pain    HPI Andrea Miller is a 32 y.o. female.  32 year old female, morbidly obese, past medical history of HTN, SVT, fibromyalgia, presents with chest pain.  Patient states pain started yesterday in the midsternal region radiating to the left chest.  She describes the pain as sharp, worse when she is up walking around.  She notes associated shortness of breath.  She states symptoms improve when she sits down.  She states today she was walking when she suddenly felt weak and fell backwards hitting the back of her head on the ground.  She denies syncope, LOC.  She is complaining of an occipital pain, neck pain, diffuse back pain, right hip and knee pain.  She states generalized weakness making it so she was unable to stand up.  She denies any focal deficits, slurred speech, numbness, tingling.  Patient denies any vomiting, abdominal pain.  She is on birth control pills.  Patient denies any recent travel/trauma/immobilization, peripheral edema.  The history is provided by the patient.  Chest Pain   This is a new problem. The current episode started yesterday. The pain is associated with exertion. The pain is present in the substernal region. The quality of the pain is described as sharp. Associated symptoms include back pain, dizziness, shortness of breath and weakness (generalized). Pertinent negatives include no abdominal pain, no fever, no nausea and no vomiting.    Past Medical History:  Diagnosis Date  . Anxiety   . Asthma   . Bipolar disorder (HCC)   . Chest pain    and angina  . CHF (congestive heart failure) (HCC)   . Depression   . Fibromyalgia   . Hypertension   . 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 Obesity   . Pre-diabetes   . Seizures (HCC)    secondary to anxiety  last seizure 01/25/18  . SVT (supraventricular tachycardia) (HCC)    with hx of syncope; managed by [facility]    Patient Active Problem List   Diagnosis Date Noted  . Chronic diastolic heart failure (HCC) 07/01/2018  . Lymphedema 07/01/2018  . Chronic cholecystitis   . Edema 02/13/2018  . Diabetes mellitus type 2, uncomplicated (HCC) 02/13/2018  . Hypertension 11/23/2017  . Asthma exacerbation 04/14/2017  . Asthma without status asthmaticus 01/16/2017  . Bipolar affective (HCC) 01/16/2017  . Coronary artery disease 01/16/2017  . Syncope 12/12/2016  . Fibromyalgia 04/27/2014  . OSA (obstructive sleep apnea) 04/27/2014  . Seizure-like activity (HCC) 04/27/2014  . Migraine without status migrainosus, not intractable 04/27/2014  . Obesity 04/15/2014  . Sleep disorder 04/15/2014  . Neck pain 04/15/2014  . Headache 04/15/2014  . Restless legs syndrome 04/15/2014  . Supraventricular tachycardia (HCC) 07/13/2009    Past Surgical History:  Procedure Laterality Date  . CHOLECYSTECTOMY N/A 02/22/2018   Procedure: LAPAROSCOPIC CHOLECYSTECTOMY;  Surgeon: Ancil Linsey, MD;  Location: [facility] ORS;  Service: General;  Laterality: N/A;  . TONSILLECTOMY AND ADENOIDECTOMY N/A 09/05/2016   Procedure: TONSILLECTOMY AND ADENOIDECTOMY;  Surgeon: Linus Salmons, MD;  Location: [facility] ORS;  Service: ENT;  Laterality: N/A;     OB History    Gravida  0   Para  0   Term  0   Preterm  0  AB  0   Living  0     SAB  0   TAB  0   Ectopic  0   Multiple  0   Live Births               Home Medications    Prior to Admission medications   Medication Sig Start Date End Date Taking? Authorizing Provider  albuterol (PROVENTIL HFA;VENTOLIN HFA) 108 (90 Base) MCG/ACT inhaler Inhale 2 puffs into the lungs every 6 (six) hours as needed for wheezing or shortness of breath. 07/31/18  Yes Merwyn Katos, MD  albuterol (PROVENTIL) (2.5 MG/3ML) 0.083% nebulizer solution  Take 2.5 mg by nebulization every 4 (four) hours as needed for wheezing or shortness of breath.   Yes [provider]  budesonide-formoterol (SYMBICORT) 160-4.5 MCG/ACT inhaler Inhale 2 puffs into the lungs 2 (two) times daily. Patient taking differently: Inhale 1 puff into the lungs 2 (two) times daily.  01/31/18  Yes Merwyn Katos, MD  Calcium-Magnesium 500-250 MG TABS Take 1 tablet by mouth 2 (two) times daily.    Yes [provider]  clonazePAM (KLONOPIN) 2 MG tablet Take 2 mg by mouth 3 (three) times daily.    Yes [provider]  DEXILANT 30 MG capsule Take 1 capsule by mouth daily. 08/28/18  Yes [provider]  diltiazem (CARDIZEM CD) 360 MG 24 hr capsule Take 360 mg by mouth daily.    Yes [provider]  escitalopram (LEXAPRO) 20 MG tablet Take 20 mg by mouth daily.   Yes [provider]  fluticasone (FLONASE) 50 MCG/ACT nasal spray Place 2 sprays into both nostrils daily as needed for allergies.    Yes [provider]  folic acid (FOLVITE) 1 MG tablet Take 1 mg by mouth daily.  12/15/16  Yes [provider]  furosemide (LASIX) 20 MG tablet Take 40 mg by mouth 2 (two) times daily.    Yes [provider]  lactulose (CHRONULAC) 10 GM/15ML solution Take 30 g by mouth daily as needed for mild constipation.  08/28/18  Yes [provider]  lamoTRIgine (LAMICTAL) 100 MG tablet Take 200 mg by mouth 2 (two) times daily.    Yes [provider]  linaclotide (LINZESS) 290 MCG CAPS capsule Take 290 mcg by mouth daily before breakfast.   Yes [provider]  meclizine (ANTIVERT) 25 MG tablet Take 1 tablet by mouth every 4 (four) hours as needed for dizziness. 08/27/18  Yes [provider]  montelukast (SINGULAIR) 10 MG tablet Take 1 tablet (10 mg total) by mouth at bedtime. 01/31/18 01/31/19 Yes Merwyn Katos, MD  norethindrone (MICRONOR,CAMILA,ERRIN) 0.35 MG tablet Take 1 tablet by mouth  daily.   Yes [provider]  orlistat (XENICAL) 120 MG capsule Take 120 mg by mouth 3 (three) times daily with meals.   Yes [provider]  potassium chloride (K-DUR) 10 MEQ tablet Take 20 mEq by mouth 2 (two) times daily.    Yes [provider]  pregabalin (LYRICA) 100 MG capsule Take 100 mg by mouth 2 (two) times daily.   Yes [provider]  Prenatal Vit-Fe Fumarate-FA (MULTIVITAMIN-PRENATAL) 27-0.8 MG TABS tablet Take 1 tablet by mouth daily at 12 noon.   Yes [provider]  promethazine (PHENERGAN) 12.5 MG tablet Take 2 tablets (25 mg total) by mouth every 6 (six) hours as needed for nausea or vomiting. 02/20/18  Yes Governor Rooks, MD  propranolol (INDERAL) 40 MG tablet  Take 80 mg by mouth 3 (three) times daily.    Yes [provider]  risperiDONE (RISPERDAL) 3 MG tablet Take 3 mg by mouth daily.    Yes [provider]  rosuvastatin (CRESTOR) 20 MG tablet Take 20 mg by mouth daily.   Yes [provider]  tiZANidine (ZANAFLEX) 4 MG tablet Take 4 mg by mouth 3 (three) times daily.  03/26/17  Yes [provider]  Topiramate ER (TROKENDI XR) 200 MG CP24 Take 1 capsule by mouth daily.    Yes [provider]    Family History Family History  Problem Relation Age of Onset  . Diabetes Mother   . Hypertension Mother   . Asthma Mother   . Gallstones Mother   . Diabetes Father   . Hypertension Father   . Gallstones Father     Social History Social History   Tobacco Use  . Smoking status: Never Smoker  . Smokeless tobacco: Never Used  Substance Use Topics  . Alcohol use: No  . Drug use: No     Allergies   Cortizone-10 [hydrocortisone]; Garlic; Prednisone; and Corticosteroids   Review of Systems Review of Systems  Constitutional: Negative for chills and fever.  Respiratory: Positive for shortness of breath.   Cardiovascular: Positive for chest pain.  Gastrointestinal: Negative for  abdominal pain, nausea and vomiting.  Musculoskeletal: Positive for back pain.  Neurological: Positive for dizziness and weakness (generalized).     Physical Exam Updated Vital Signs BP 134/72 (BP Location: Right Arm)   Pulse 74   Temp 98.5 F (36.9 C) (Oral)   Resp 18   Ht 5\' 9"  (1.753 m)   Wt (!) 163.3 kg   LMP 08/22/2018   SpO2 100%   BMI 53.16 kg/m   Physical Exam  Constitutional: She is oriented to person, place, and time. She appears well-developed and well-nourished.  HENT:  Head: Normocephalic and atraumatic.  Eyes: Conjunctivae and EOM are normal.  Neck: Neck supple.  Cardiovascular: Normal rate, regular rhythm and normal heart sounds.  No murmur heard. Pulmonary/Chest: Effort normal and breath sounds normal. No respiratory distress. She has no wheezes. She has no rales.  Abdominal: Soft. Bowel sounds are normal. She exhibits no distension. There is no tenderness.  Musculoskeletal: Normal range of motion. She exhibits no deformity.       Right hip: She exhibits tenderness (over the lateral right hip, FROM).       Right knee: She exhibits no swelling, no ecchymosis, no deformity, no laceration, no erythema and normal alignment. Tenderness found.       Cervical back: She exhibits no bony tenderness.       Thoracic back: She exhibits tenderness (over upper midline thoracic spine at T1-T4). She exhibits no swelling, no edema and no deformity.       Lumbar back: She exhibits bony tenderness (over lower lumbar spine at L4/L5).  Neurological: She is alert and oriented to person, place, and time. She has normal strength. No cranial nerve deficit or sensory deficit. GCS eye subscore is 4. GCS verbal subscore is 5. GCS motor subscore is 6.  Skin: Skin is warm and dry. No rash noted. No erythema.  Psychiatric: She has a normal mood and affect. Her behavior is normal.  Nursing note and vitals reviewed.    ED Treatments / Results  Labs (all labs ordered are listed, but only  abnormal results are displayed) Labs Reviewed  CBC - Abnormal; Notable for the following components:  Result Value   MCHC 29.7 (*)    All other components within normal limits  D-DIMER, QUANTITATIVE (NOT AT [facility]) - Abnormal; Notable for the following components:   D-Dimer, Quant 1.42 (*)    All other components within normal limits  BASIC METABOLIC PANEL  RAPID URINE DRUG SCREEN, HOSP PERFORMED  I-STAT TROPONIN, ED  I-STAT BETA HCG BLOOD, ED ([facility], [facility], [facility] ONLY)    EKG EKG Interpretation  Date/Time:  Sunday September 15 2018 12:29:29 EDT Ventricular Rate:  75 PR Interval:    QRS Duration: 103 QT Interval:  362 QTC Calculation: 405 R Axis:   60 Text Interpretation:  Sinus rhythm Inferior infarct, old Lateral leads are also involved since last tracing no significant change Confirmed by Eber Hong ([id number]) on 09/15/2018 12:36:46 PM   Radiology Dg Chest 1 View  Result Date: 09/15/2018 CLINICAL DATA:  Chest pain EXAM: CHEST  1 VIEW COMPARISON:  08/15/2018 FINDINGS: Low volume chest that accentuates cardiomegaly and vascular pedicle widening. There is no edema, consolidation, effusion, or pneumothorax. The lateral left base is excluded from view, there is pending chest CT. IMPRESSION: Low volume chest with cardiomegaly. Electronically Signed   By: Marnee Spring M.D.   On: 09/15/2018 15:35   Dg Thoracic Spine 4v  Result Date: 09/15/2018 CLINICAL DATA:  Fall down stairs. Dizziness. Back pain. Initial encounter. EXAM: THORACIC SPINE - 4+ VIEW COMPARISON:  CT chest 06/22/2018. FINDINGS: There is no evidence of thoracic spine fracture. Alignment is normal. No other significant bone abnormalities are identified. IMPRESSION: Negative. Electronically Signed   By: Marin Roberts M.D.   On: 09/15/2018 15:30   Dg Lumbar Spine Complete  Result Date: 09/15/2018 CLINICAL DATA:  Fall.  Fall down stairs.  Low back pain. EXAM: LUMBAR SPINE - COMPLETE 4+ VIEW COMPARISON:  None. FINDINGS:  Five non rib-bearing lumbar type vertebral bodies are present. Vertebral body heights alignment are maintained. No acute fractures are present. IMPRESSION: Negative. Electronically Signed   By: Marin Roberts M.D.   On: 09/15/2018 15:29   Ct Angio Chest Pe W/cm &/or Wo Cm  Result Date: 09/15/2018 CLINICAL DATA:  Midsternal chest pain radiating to left side of chest beginning this morning. Shortness of breath with nausea and dizziness as well as weakness. Headache. Fall EXAM: CT ANGIOGRAPHY CHEST WITH CONTRAST TECHNIQUE: Multidetector CT imaging of the chest was performed using the standard protocol during bolus administration of intravenous contrast. Multiplanar CT image reconstructions and MIPs were obtained to evaluate the vascular anatomy. CONTRAST:  ISOVUE-370 IOPAMIDOL (ISOVUE-370) INJECTION 76% COMPARISON:  06/22/2018 FINDINGS: Cardiovascular: Mild cardiomegaly. Thoracic aorta is within normal. Pulmonary arterial system is adequately opacified. There is a subtle filling defect over 2 adjacent proximal left upper lobe arteries. Possible subtle filling defect over a distal right lower lobar pulmonary artery seen best on the coronal images. Mediastinum/Nodes: No mediastinal or hilar adenopathy. Remaining mediastinal structures are unremarkable. Lungs/Pleura: Lungs are adequately inflated without focal airspace consolidation or effusion. Airways are normal. Upper Abdomen: Previous cholecystectomy.  No acute findings. Musculoskeletal: Unremarkable. Review of the MIP images confirms the above findings. IMPRESSION: Findings suggesting small bilateral pulmonary emboli. Mild cardiomegaly. Critical Value/emergent results were called by telephone at the time of interpretation on 09/15/2018 at 4:01 pm to Dr. Eber Hong, who verbally acknowledged these results. Electronically Signed   By: Elberta Fortis M.D.   On: 09/15/2018 16:02   Dg Knee Complete 4 Views Right  Result Date: 09/15/2018 CLINICAL  DATA:  Right knee pain due  to an injury suffered in a fall down stairs this morning. Initial encounter. EXAM: RIGHT KNEE - COMPLETE 4+ VIEW COMPARISON:  None. FINDINGS: No evidence of fracture, dislocation, or joint effusion. No evidence of arthropathy or other focal bone abnormality. Soft tissues are unremarkable. IMPRESSION: Negative exam. Electronically Signed   By: Drusilla Kanner M.D.   On: 09/15/2018 15:11   Dg Femur 1v Right  Result Date: 09/15/2018 CLINICAL DATA:  Right upper leg pain due to an injury suffered in a fall down stairs this morning. Initial encounter. EXAM: RIGHT FEMUR 1 VIEW COMPARISON:  None. FINDINGS: There is no evidence of fracture or other focal bone lesions. Soft tissues are unremarkable. IMPRESSION: Negative exam. Electronically Signed   By: Drusilla Kanner M.D.   On: 09/15/2018 15:12    Procedures .Critical Care Performed by: Clayborne Artist, PA-C Authorized by: Clayborne Artist, PA-C   Critical care provider statement:    Critical care time (minutes):  45   Critical care start time:  09/15/2018 12:40 PM   Critical care end time:  09/15/2018 4:22 PM   Critical care was necessary to treat or prevent imminent or life-threatening deterioration of the following conditions:  Respiratory failure   Critical care was time spent personally by me on the following activities:  Discussions with consultants, evaluation of patient's response to treatment, examination of patient, ordering and performing treatments and interventions, ordering and review of laboratory studies, ordering and review of radiographic studies, pulse oximetry, re-evaluation of patient's condition, obtaining history from patient or surrogate and review of old charts   (including critical care time)  Medications Ordered in ED Medications  acetaminophen (TYLENOL) tablet 650 mg (has no administration in time range)  ondansetron (ZOFRAN) injection 4 mg (has no administration in time range)    iopamidol (ISOVUE-370) 76 % injection 100 mL (100 mLs Intravenous Contrast Given 09/15/18 1511)     Initial Impression / Assessment and Plan / ED Course  I have reviewed the triage vital signs and the nursing notes.  Pertinent labs & imaging results that were available during my care of the patient were reviewed by me and considered in my medical decision making (see chart for details).     CTA shows bilateral PE's. I provided critical care to this patient by starting a heparin drip for potential life threatening PE's. Called and spoke to hospitalist, Dr. Mariea Clonts, who will admit pt.  Final Clinical Impressions(s) / ED Diagnoses   Final diagnoses:  Pulmonary embolism, bilateral [facility])    ED Discharge Orders    None       Rueben Bash 09/15/18 2152    Eber Hong, MD 09/16/18 1452

## 2018-09-15 NOTE — Progress Notes (Signed)
ANTICOAGULATION CONSULT NOTE - Follow Up Consult  Pharmacy Consult for heparin Indication: pulmonary embolus  Labs: Recent Labs    09/15/18 1340 09/15/18 2308  HGB 13.2  --   HCT 44.5  --   PLT 233  --   LABPROT 12.8  --   INR 0.97  --   HEPARINUNFRC  --  0.85*  CREATININE 0.92  --     Assessment: 32yo female supratherapeutic on heparin with initial dosing for PE; no gtt issues or signs of bleeding per RN.  Goal of Therapy:  Heparin level 0.3-0.7 units/ml   Plan:  Will decrease heparin gtt by 2 units/kgABW/hr to 1500 units/hr and check level in 6 hours.    Vernard Gambles, PharmD, BCPS  09/15/2018,11:33 PM

## 2018-09-15 NOTE — H&P (Signed)
Patient Demographics:    Andrea Miller, is a 32 y.o. female  MRN: [id number]   DOB - [birth year]  Admit Date - 09/15/2018  Outpatient Primary MD for the patient is Emogene Morgan, MD   Assessment & Plan:    Principal Problem:   Bil Pulmonary embolism  Active Problems:   Bipolar affective (HCC)   OSA (obstructive sleep apnea)   Hypertension   Chronic diastolic heart failure (HCC)   Lymphedema   Morbid obesity with BMI of 50.0-59.9, adult (HCC)    1)Acute Bil PE--- CTA chest noted, VTE/PE risk factors include sedentary lifestyle, morbid obesity and hormonal contraception use..... Get echocardiogram to evaluate EF and to rule out right heart strain, get lower extremity Dopplers to evaluate for clot burden.  Clinically patient has enough risk factors for PE that probably no need to initiate a hypercoagulability work-up at this time, treat empirically with IV heparin for now and transition to DOAC upon discharge  2)Morbid Obesity--- BMI above 50 , patient was supposed to have bariatric/weight loss surgery in the next few weeks this may have to wait now that she is on full anticoagulation for bilateral PE  3)Bipolar DO/Anxiety--- stable, continue Topamax, continue risperidone, Lexapro 10 mg daily as needed Klonopin  4)H/o intermittent positional tachycardia/H/o PSVT -currently in sinus rhythm previously evaluated by cardiologist in [place], continue Cardizem CD 360 mg, continue propranolol 80 mg 3 times daily  5)Chronic Pain/Fibromyalgia--- stable, continue Zanaflex 4 mg 3 times daily, Lyrica 100 mg twice daily  6)Dizziness with Fall----imaging studies and clinical exam without acute fractures, get physical therapy eval, X-rays of the femur, thoracic and  lumbar spine and knee post fall without acute findings    7)HTN-stable, Cardizem CD 360 mg daily and propranolol 80 mg  3 times daily  8)OSA----in the setting of morbid obesity with BMI over 50, use CPAP nightly  With History of - Reviewed by me  Past Medical History:  Diagnosis Date  . Anxiety   . Asthma   . Bipolar disorder (HCC)   . Chest pain    and angina  . CHF (congestive heart failure) (HCC)   . Depression   . Fibromyalgia   . Hypertension   . 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 Obesity   . Pre-diabetes   . Seizures (HCC)    secondary to anxiety  last seizure 01/25/18  . SVT (supraventricular tachycardia) (HCC)    with hx of syncope; managed by [facility]      Past Surgical History:  Procedure Laterality Date  . CHOLECYSTECTOMY N/A 02/22/2018   Procedure: LAPAROSCOPIC CHOLECYSTECTOMY;  Surgeon: Ancil Linsey, MD;  Location: [facility] ORS;  Service: General;  Laterality: N/A;  . TONSILLECTOMY AND ADENOIDECTOMY N/A 09/05/2016   Procedure: TONSILLECTOMY AND ADENOIDECTOMY;  Surgeon: Linus Salmons, MD;  Location: [facility] ORS;  Service: ENT;  Laterality: N/A;      Chief Complaint  Patient presents with  . Chest Pain      HPI:    Andrea Miller  is a 32 y.o. female with past medical history relevant for morbid obesity, HTN, chronic pain and fibromyalgia, currently on OCP/Micronor as well as history of intermittent tachycardia/paroxysmal SVTs who presents to the ED with complaints of left-sided chest discomfort, shortness of breath dizziness and fall earlier today while at home.  Patient denies loss of consciousness, she does not think she syncopized, she thinks she may have hit her head, no headaches at this time,  No recent travel, patient has been less active lately due to back pain, she is currently getting outpatient physical therapy due to work-related back injury, no increased lower extremity edema, no leg pains, denies pleuritic symptoms, endorses some degree of dyspnea on exertion.   No palpitations  No focal extremity weakness no visual disturbance, no speech problems  In ED--- d-dimer elevated, CTA chest with small bilateral PEs  X-rays of the femur, thoracic and  lumbar spine and knee post fall without acute findings   Review of systems:    In addition to the HPI above,   A full Review of  Systems was done, all other systems reviewed are negative except as noted above in HPI , .    Social History:  Reviewed by me    Social History   Tobacco Use  . Smoking status: Never Smoker  . Smokeless tobacco: Never Used  Substance Use Topics  . Alcohol use: No     Family History :  Reviewed by me   Family History  Problem Relation Age of Onset  . Diabetes Mother   . Hypertension Mother   . Asthma Mother   . Gallstones Mother   . Diabetes Father   . Hypertension Father   . Gallstones Father      Home Medications:   Prior to Admission medications   Medication Sig Start Date End Date Taking? Authorizing Provider  albuterol (PROVENTIL HFA;VENTOLIN HFA) 108 (90 Base) MCG/ACT inhaler Inhale 2 puffs into the lungs every 6 (six) hours as needed for wheezing or shortness of breath. 07/31/18  Yes Merwyn Katos, MD  albuterol (PROVENTIL) (2.5 MG/3ML) 0.083% nebulizer solution Take 2.5 mg by nebulization every 4 (four) hours as needed for wheezing or shortness of breath.   Yes [provider]  budesonide-formoterol (SYMBICORT) 160-4.5 MCG/ACT inhaler Inhale 2 puffs into the lungs 2 (two) times daily. Patient taking differently: Inhale 1 puff into the lungs 2 (two) times daily.  01/31/18  Yes Merwyn Katos, MD  Calcium-Magnesium 500-250 MG TABS Take 1 tablet by mouth 2 (two) times daily.    Yes [provider]  clonazePAM (KLONOPIN) 2 MG tablet Take 2 mg by mouth 3 (three) times daily.    Yes [provider]  DEXILANT 30 MG capsule Take 1 capsule by mouth daily. 08/28/18  Yes [provider]  diltiazem (CARDIZEM CD) 360 MG 24 hr  capsule Take 360 mg by mouth daily.    Yes [provider]  escitalopram (LEXAPRO) 20 MG tablet Take 20 mg by mouth daily.   Yes [provider]  fluticasone (FLONASE) 50 MCG/ACT nasal spray Place 2 sprays into both nostrils daily as needed for allergies.    Yes [provider]  folic acid (FOLVITE) 1 MG tablet Take 1 mg by mouth daily.  12/15/16  Yes [provider]  furosemide (LASIX) 20 MG tablet Take 40 mg by mouth 2 (two)  times daily.    Yes [provider]  lactulose (CHRONULAC) 10 GM/15ML solution Take 30 g by mouth daily as needed for mild constipation.  08/28/18  Yes [provider]  lamoTRIgine (LAMICTAL) 100 MG tablet Take 200 mg by mouth 2 (two) times daily.    Yes [provider]  linaclotide (LINZESS) 290 MCG CAPS capsule Take 290 mcg by mouth daily before breakfast.   Yes [provider]  meclizine (ANTIVERT) 25 MG tablet Take 1 tablet by mouth every 4 (four) hours as needed for dizziness. 08/27/18  Yes [provider]  montelukast (SINGULAIR) 10 MG tablet Take 1 tablet (10 mg total) by mouth at bedtime. 01/31/18 01/31/19 Yes Merwyn Katos, MD  norethindrone (MICRONOR,CAMILA,ERRIN) 0.35 MG tablet Take 1 tablet by mouth daily.   Yes [provider]  orlistat (XENICAL) 120 MG capsule Take 120 mg by mouth 3 (three) times daily with meals.   Yes [provider]  potassium chloride (K-DUR) 10 MEQ tablet Take 20 mEq by mouth 2 (two) times daily.    Yes [provider]  pregabalin (LYRICA) 100 MG capsule Take 100 mg by mouth 2 (two) times daily.   Yes [provider]  Prenatal Vit-Fe Fumarate-FA (MULTIVITAMIN-PRENATAL) 27-0.8 MG TABS tablet Take 1 tablet by mouth daily at 12 noon.   Yes [provider]  promethazine (PHENERGAN) 12.5 MG tablet Take 2 tablets (25 mg total) by mouth every 6 (six) hours as needed for nausea or vomiting. 02/20/18  Yes Governor Rooks, MD    propranolol (INDERAL) 40 MG tablet Take 80 mg by mouth 3 (three) times daily.    Yes [provider]  risperiDONE (RISPERDAL) 3 MG tablet Take 3 mg by mouth daily.    Yes [provider]  rosuvastatin (CRESTOR) 20 MG tablet Take 20 mg by mouth daily.   Yes [provider]  tiZANidine (ZANAFLEX) 4 MG tablet Take 4 mg by mouth 3 (three) times daily.  03/26/17  Yes [provider]  Topiramate ER (TROKENDI XR) 200 MG CP24 Take 1 capsule by mouth daily.    Yes [provider]     Allergies:     Allergies  Allergen Reactions  . Cortizone-10 [Hydrocortisone] Shortness Of Breath and Swelling  . Garlic Anaphylaxis  . Prednisone Swelling    Tongue swelling  . Corticosteroids Palpitations     Physical Exam:   Vitals  Blood pressure 134/72, pulse 74, temperature 98.5 F (36.9 C), temperature source Oral, resp. rate 18, height 5\' 9"  (1.753 m), weight (!) 163.3 kg, last menstrual period 08/22/2018, SpO2 100 %.  Physical Examination: General appearance - alert, morbidly obese and in no distress  Mental status - alert, oriented to person, place, and time,  Eyes - sclera anicteric Neck - supple, no JVD elevation , Chest - clear  to auscultation bilaterally, symmetrical air movement,  Heart - S1 and S2 normal, regular Abdomen - soft, nontender, nondistended, significantly increased truncal adiposity Neurological - screening mental status exam normal, neck supple without rigidity, cranial nerves II through XII intact, DTR's normal and symmetric Extremities - no pedal edema noted, intact peripheral pulses  Skin - warm, dry     Data Review:    CBC Recent Labs  Lab 09/15/18 1340  WBC 5.3  HGB 13.2  HCT 44.5  PLT 233  MCV 87.8  MCH 26.0  MCHC 29.7*  RDW 14.6   ------------------------------------------------------------------------------------------------------------------  Chemistries  Recent Labs  Lab 09/15/18 1340  NA  141  K 3.8   CL 109  CO2 26  GLUCOSE 99  BUN 14  CREATININE 0.92  CALCIUM 9.3   ------------------------------------------------------------------------------------------------------------------ estimated creatinine clearance is 148.2 mL/min (by C-G formula based on SCr of 0.92 mg/dL). ------------------------------------------------------------------------------------------------------------------ No results for input(s): TSH, T4TOTAL, T3FREE, THYROIDAB in the last 72 hours.  Invalid input(s): FREET3   Coagulation profile Recent Labs  Lab 09/15/18 1340  INR 0.97   ------------------------------------------------------------------------------------------------------------------- Recent Labs    09/15/18 1340  DDIMER 1.42*   -------------------------------------------------------------------------------------------------------------------  Cardiac Enzymes No results for input(s): CKMB, TROPONINI, MYOGLOBIN in the last 168 hours.  Invalid input(s): CK ------------------------------------------------------------------------------------------------------------------    Component Value Date/Time   BNP 49.0 06/29/2018 0816     ---------------------------------------------------------------------------------------------------------------  Urinalysis    Component Value Date/Time   COLORURINE YELLOW (A) 09/04/2018 1948   APPEARANCEUR CLEAR (A) 09/04/2018 1948   LABSPEC 1.006 09/04/2018 1948   PHURINE 6.0 09/04/2018 1948   GLUCOSEU NEGATIVE 09/04/2018 1948   HGBUR NEGATIVE 09/04/2018 1948   BILIRUBINUR NEGATIVE 09/04/2018 1948   KETONESUR NEGATIVE 09/04/2018 1948   PROTEINUR NEGATIVE 09/04/2018 1948   NITRITE POSITIVE (A) 09/04/2018 1948   LEUKOCYTESUR NEGATIVE 09/04/2018 1948    ----------------------------------------------------------------------------------------------------------------   Imaging Results:    Dg Chest 1 View  Result Date: 09/15/2018 CLINICAL DATA:  Chest  pain EXAM: CHEST  1 VIEW COMPARISON:  08/15/2018 FINDINGS: Low volume chest that accentuates cardiomegaly and vascular pedicle widening. There is no edema, consolidation, effusion, or pneumothorax. The lateral left base is excluded from view, there is pending chest CT. IMPRESSION: Low volume chest with cardiomegaly. Electronically Signed   By: Marnee Spring M.D.   On: 09/15/2018 15:35   Dg Thoracic Spine 4v  Result Date: 09/15/2018 CLINICAL DATA:  Fall down stairs. Dizziness. Back pain. Initial encounter. EXAM: THORACIC SPINE - 4+ VIEW COMPARISON:  CT chest 06/22/2018. FINDINGS: There is no evidence of thoracic spine fracture. Alignment is normal. No other significant bone abnormalities are identified. IMPRESSION: Negative. Electronically Signed   By: Marin Roberts M.D.   On: 09/15/2018 15:30   Dg Lumbar Spine Complete  Result Date: 09/15/2018 CLINICAL DATA:  Fall.  Fall down stairs.  Low back pain. EXAM: LUMBAR SPINE - COMPLETE 4+ VIEW COMPARISON:  None. FINDINGS: Five non rib-bearing lumbar type vertebral bodies are present. Vertebral body heights alignment are maintained. No acute fractures are present. IMPRESSION: Negative. Electronically Signed   By: Marin Roberts M.D.   On: 09/15/2018 15:29   Ct Angio Chest Pe W/cm &/or Wo Cm  Result Date: 09/15/2018 CLINICAL DATA:  Midsternal chest pain radiating to left side of chest beginning this morning. Shortness of breath with nausea and dizziness as well as weakness. Headache. Fall EXAM: CT ANGIOGRAPHY CHEST WITH CONTRAST TECHNIQUE: Multidetector CT imaging of the chest was performed using the standard protocol during bolus administration of intravenous contrast. Multiplanar CT image reconstructions and MIPs were obtained to evaluate the vascular anatomy. CONTRAST:  ISOVUE-370 IOPAMIDOL (ISOVUE-370) INJECTION 76% COMPARISON:  06/22/2018 FINDINGS: Cardiovascular: Mild cardiomegaly. Thoracic aorta is within normal. Pulmonary arterial  system is adequately opacified. There is a subtle filling defect over 2 adjacent proximal left upper lobe arteries. Possible subtle filling defect over a distal right lower lobar pulmonary artery seen best on the coronal images. Mediastinum/Nodes: No mediastinal or hilar adenopathy. Remaining mediastinal structures are unremarkable. Lungs/Pleura: Lungs are adequately inflated without focal airspace consolidation or effusion. Airways are normal. Upper Abdomen: Previous cholecystectomy.  No acute findings. Musculoskeletal: Unremarkable. Review of the  MIP images confirms the above findings. IMPRESSION: Findings suggesting small bilateral pulmonary emboli. Mild cardiomegaly. Critical Value/emergent results were called by telephone at the time of interpretation on 09/15/2018 at 4:01 pm to Dr. Eber Hong, who verbally acknowledged these results. Electronically Signed   By: Elberta Fortis M.D.   On: 09/15/2018 16:02   Dg Knee Complete 4 Views Right  Result Date: 09/15/2018 CLINICAL DATA:  Right knee pain due to an injury suffered in a fall down stairs this morning. Initial encounter. EXAM: RIGHT KNEE - COMPLETE 4+ VIEW COMPARISON:  None. FINDINGS: No evidence of fracture, dislocation, or joint effusion. No evidence of arthropathy or other focal bone abnormality. Soft tissues are unremarkable. IMPRESSION: Negative exam. Electronically Signed   By: Drusilla Kanner M.D.   On: 09/15/2018 15:11   Dg Femur 1v Right  Result Date: 09/15/2018 CLINICAL DATA:  Right upper leg pain due to an injury suffered in a fall down stairs this morning. Initial encounter. EXAM: RIGHT FEMUR 1 VIEW COMPARISON:  None. FINDINGS: There is no evidence of fracture or other focal bone lesions. Soft tissues are unremarkable. IMPRESSION: Negative exam. Electronically Signed   By: Drusilla Kanner M.D.   On: 09/15/2018 15:12    Radiological Exams on Admission: Dg Chest 1 View  Result Date: 09/15/2018 CLINICAL DATA:  Chest pain EXAM:  CHEST  1 VIEW COMPARISON:  08/15/2018 FINDINGS: Low volume chest that accentuates cardiomegaly and vascular pedicle widening. There is no edema, consolidation, effusion, or pneumothorax. The lateral left base is excluded from view, there is pending chest CT. IMPRESSION: Low volume chest with cardiomegaly. Electronically Signed   By: Marnee Spring M.D.   On: 09/15/2018 15:35   Dg Thoracic Spine 4v  Result Date: 09/15/2018 CLINICAL DATA:  Fall down stairs. Dizziness. Back pain. Initial encounter. EXAM: THORACIC SPINE - 4+ VIEW COMPARISON:  CT chest 06/22/2018. FINDINGS: There is no evidence of thoracic spine fracture. Alignment is normal. No other significant bone abnormalities are identified. IMPRESSION: Negative. Electronically Signed   By: Marin Roberts M.D.   On: 09/15/2018 15:30   Dg Lumbar Spine Complete  Result Date: 09/15/2018 CLINICAL DATA:  Fall.  Fall down stairs.  Low back pain. EXAM: LUMBAR SPINE - COMPLETE 4+ VIEW COMPARISON:  None. FINDINGS: Five non rib-bearing lumbar type vertebral bodies are present. Vertebral body heights alignment are maintained. No acute fractures are present. IMPRESSION: Negative. Electronically Signed   By: Marin Roberts M.D.   On: 09/15/2018 15:29   Ct Angio Chest Pe W/cm &/or Wo Cm  Result Date: 09/15/2018 CLINICAL DATA:  Midsternal chest pain radiating to left side of chest beginning this morning. Shortness of breath with nausea and dizziness as well as weakness. Headache. Fall EXAM: CT ANGIOGRAPHY CHEST WITH CONTRAST TECHNIQUE: Multidetector CT imaging of the chest was performed using the standard protocol during bolus administration of intravenous contrast. Multiplanar CT image reconstructions and MIPs were obtained to evaluate the vascular anatomy. CONTRAST:  ISOVUE-370 IOPAMIDOL (ISOVUE-370) INJECTION 76% COMPARISON:  06/22/2018 FINDINGS: Cardiovascular: Mild cardiomegaly. Thoracic aorta is within normal. Pulmonary arterial system is  adequately opacified. There is a subtle filling defect over 2 adjacent proximal left upper lobe arteries. Possible subtle filling defect over a distal right lower lobar pulmonary artery seen best on the coronal images. Mediastinum/Nodes: No mediastinal or hilar adenopathy. Remaining mediastinal structures are unremarkable. Lungs/Pleura: Lungs are adequately inflated without focal airspace consolidation or effusion. Airways are normal. Upper Abdomen: Previous cholecystectomy.  No acute findings. Musculoskeletal: Unremarkable. Review  of the MIP images confirms the above findings. IMPRESSION: Findings suggesting small bilateral pulmonary emboli. Mild cardiomegaly. Critical Value/emergent results were called by telephone at the time of interpretation on 09/15/2018 at 4:01 pm to Dr. Eber Hong, who verbally acknowledged these results. Electronically Signed   By: Elberta Fortis M.D.   On: 09/15/2018 16:02   Dg Knee Complete 4 Views Right  Result Date: 09/15/2018 CLINICAL DATA:  Right knee pain due to an injury suffered in a fall down stairs this morning. Initial encounter. EXAM: RIGHT KNEE - COMPLETE 4+ VIEW COMPARISON:  None. FINDINGS: No evidence of fracture, dislocation, or joint effusion. No evidence of arthropathy or other focal bone abnormality. Soft tissues are unremarkable. IMPRESSION: Negative exam. Electronically Signed   By: Drusilla Kanner M.D.   On: 09/15/2018 15:11   Dg Femur 1v Right  Result Date: 09/15/2018 CLINICAL DATA:  Right upper leg pain due to an injury suffered in a fall down stairs this morning. Initial encounter. EXAM: RIGHT FEMUR 1 VIEW COMPARISON:  None. FINDINGS: There is no evidence of fracture or other focal bone lesions. Soft tissues are unremarkable. IMPRESSION: Negative exam. Electronically Signed   By: Drusilla Kanner M.D.   On: 09/15/2018 15:12    DVT Prophylaxis -iv heparin AM Labs Ordered, also please review Full Orders  Family Communication: Admission, patients  condition and plan of care including tests being ordered have been discussed with the patient  who indicate understanding and agree with the plan   Code Status - Full Code  Likely DC to  home  Condition   stable  Shon Hale M.D on 09/15/2018 at 8:21 PM Pager---[phone] Go to [email or web address] - [email or web address] for contact info  [facility] - Office  [phone]

## 2018-09-16 ENCOUNTER — Inpatient Hospital Stay (HOSPITAL_COMMUNITY): Payer: BLUE CROSS/BLUE SHIELD

## 2018-09-16 DIAGNOSIS — I10 Essential (primary) hypertension: Secondary | ICD-10-CM | POA: Diagnosis not present

## 2018-09-16 DIAGNOSIS — F319 Bipolar disorder, unspecified: Secondary | ICD-10-CM | POA: Diagnosis not present

## 2018-09-16 DIAGNOSIS — I2699 Other pulmonary embolism without acute cor pulmonale: Secondary | ICD-10-CM | POA: Diagnosis not present

## 2018-09-16 DIAGNOSIS — I5030 Unspecified diastolic (congestive) heart failure: Secondary | ICD-10-CM | POA: Diagnosis not present

## 2018-09-16 DIAGNOSIS — I5032 Chronic diastolic (congestive) heart failure: Secondary | ICD-10-CM | POA: Diagnosis not present

## 2018-09-16 DIAGNOSIS — Z6841 Body Mass Index (BMI) 40.0 and over, adult: Secondary | ICD-10-CM

## 2018-09-16 LAB — ECHOCARDIOGRAM COMPLETE
Height: 69 in
Weight: 5760 oz

## 2018-09-16 LAB — CBC
HCT: 40.1 % (ref 36.0–46.0)
Hemoglobin: 11.8 g/dL — ABNORMAL LOW (ref 12.0–15.0)
MCH: 25.9 pg — ABNORMAL LOW (ref 26.0–34.0)
MCHC: 29.4 g/dL — ABNORMAL LOW (ref 30.0–36.0)
MCV: 87.9 fL (ref 80.0–100.0)
Platelets: 222 10*3/uL (ref 150–400)
RBC: 4.56 MIL/uL (ref 3.87–5.11)
RDW: 14.9 % (ref 11.5–15.5)
WBC: 4.9 10*3/uL (ref 4.0–10.5)
nRBC: 0 % (ref 0.0–0.2)

## 2018-09-16 LAB — BASIC METABOLIC PANEL
Anion gap: 6 (ref 5–15)
BUN: 13 mg/dL (ref 6–20)
CO2: 27 mmol/L (ref 22–32)
Calcium: 9 mg/dL (ref 8.9–10.3)
Chloride: 109 mmol/L (ref 98–111)
Creatinine, Ser: 1.01 mg/dL — ABNORMAL HIGH (ref 0.44–1.00)
GFR calc Af Amer: >60 mL/min (ref >60)
GFR calc non Af Amer: >60 mL/min (ref >60)
Glucose, Bld: 98 mg/dL (ref 70–99)
Potassium: 4 mmol/L (ref 3.5–5.1)
Sodium: 142 mmol/L (ref 135–145)

## 2018-09-16 LAB — HEPARIN LEVEL (UNFRACTIONATED): Heparin Unfractionated: 0.92 IU/mL — ABNORMAL HIGH (ref 0.30–0.70)

## 2018-09-16 MED ORDER — RIVAROXABAN 15 MG PO TABS
15.0000 mg | ORAL_TABLET | Freq: Two times a day (BID) | ORAL | Status: DC
  Administered 2018-09-16 (×2): 15 mg via ORAL
  Filled 2018-09-16 (×2): qty 1

## 2018-09-16 MED ORDER — SODIUM CHLORIDE 0.9 % IV BOLUS
500.0000 mL | Freq: Once | INTRAVENOUS | Status: AC
  Administered 2018-09-16: 500 mL via INTRAVENOUS

## 2018-09-16 MED ORDER — RIVAROXABAN (XARELTO) VTE STARTER PACK (15 & 20 MG): ORAL_TABLET | ORAL | 0 refills | Status: DC

## 2018-09-16 MED ORDER — PREGABALIN 50 MG PO CAPS: 50.0000 mg | ORAL_CAPSULE | Freq: Two times a day (BID) | ORAL | 0 refills | Status: DC

## 2018-09-16 NOTE — Evaluation (Signed)
Physical Therapy Evaluation Patient Details Name: Andrea Miller MRN: [id number] DOB: 20-Dec-1985 Today's Date: 09/16/2018   History of Present Illness  [facility] down[facility] porch stairs x5. Pt noted to have acutePE, started on IVheparin, now transitioned to oral Xarelto.   Clinical Impression  Pt admitted with above diagnosis. Pt currently with functional limitations due to the deficits listed below (see "PT Problem List"). Upon entry, pt in bed, no family/caregiver present. The pt is somnolent, lethargic, and but amicable and agreeable to participate. Pt appears to have minimal awareness of her slurred speech,  But does endorse feeling sleepy. She reports this as unlike baseline, denies poor sleep hygiene last night. Pt requires additional time to follow simple commands and is mildlyi mpulsive at times pausing to attend to a different task. HR/SpO2 remain WNL throughout. Pt denies SOB or dizziness. More concerning, the patient endorses high frequency falls, often related to dizziness and/or blurred vision. Functional mobility assessment demonstrates increased effort/time requirements, no need for physical assistance, but similar to level of independence PTA. Pt has limited support at home. Pt will benefit from skilled PT intervention to increase independence and safety with basic mobility in preparation for discharge to the venue listed below.       Follow Up Recommendations Outpatient PT(resume OPPT c [facility])    Equipment Recommendations  None recommended by PT    Recommendations for Other Services       Precautions / Restrictions Precautions Precautions: Back Precaution Comments: pt reports being on back precautions>4 yearswith LSO use when OOB.  Restrictions Weight Bearing Restrictions: No      Mobility  Bed Mobility               General bed mobility comments: moderate toheavyeffortrequired d/t large habitus,likely  similar to baseline.  Transfers Overall transfer level: Needs assistance   Transfers: Sit to/from Stand;Stand Pivot Transfers Sit to Stand: Supervision Stand pivot transfers: Supervision(no LOB, but patient somewhat impulsive and distractible.)       General transfer comment: well establishedmotorpatterns suggestive ofchronic knee extensor weakness, exaggerated hip hinge with segmentatl hip then trunk extension.   Ambulation/Gait Ambulation/Gait assistance: (deferred until later visit d/t lethrargy andcognitive impairment)              Stairs            Wheelchair Mobility    Modified Rankin (Stroke Patients Only)       Balance Overall balance assessment: Needs assistance(no LOBin session noted, but patientendorses frequent falls)                                           Pertinent Vitals/Pain Pain Assessment: No/denies pain(reports pain all overbody fromfall, but does not rate other than 'severe')    Home Living Family/patient expects to be discharged to:: Private residence Living Arrangements: Alone Available Help at Discharge: Family Type of Home: Mobile home Home Access: Stairs to enter Entrance Stairs-Rails: Lawyer of Steps: 5 Home Layout: One level Home Equipment: Environmental consultant - 2 wheels;Cane - single point;Bedside commode      Prior Function Level of Independence: Needs assistance   Gait / Transfers Assistance Needed: Pt reportslimited community AMB cSPCd/t chronicBLE orthopedicproblems. Frequentfalls  ADL's / Homemaking Assistance Needed: Pt reportsneed forassistance with bathing and dressing d/t back precautions        Hand Dominance  Extremity/Trunk Assessment        Lower Extremity Assessment Lower Extremity Assessment: Generalized weakness;Overall [facility] for tasks assessed       Communication   Communication: No difficulties  Cognition Arousal/Alertness: Lethargic;Suspect due to  medications Behavior During Therapy: Flat affect Overall Cognitive Status: Difficult to assess                                        General Comments      Exercises     Assessment/Plan    PT Assessment Patient needs continued PT services  PT Problem List Decreased strength;Decreased activity tolerance;Decreased balance;Decreased mobility       PT Treatment Interventions DME instruction;Balance training;Gait training;Functional mobility training;Stair training;Therapeutic activities;Therapeutic exercise;Patient/family education    PT Goals (Current goals can be found in the Care Plan section)  Acute Rehab PT Goals Patient Stated Goal: return to home PT Goal Formulation: With patient Time For Goal Achievement: 09/23/18 Potential to Achieve Goals: Good    Frequency Min 3X/week   Barriers to discharge Decreased caregiver support      Co-evaluation               AM-PAC PT "6 Clicks" Daily Activity  Outcome Measure Difficulty turning over in bed (including adjusting bedclothes, sheets and blankets)?: Unable Difficulty moving from lying on back to sitting on the side of the bed? : Unable Difficulty sitting down on and standing up from a chair with arms (e.g., wheelchair, bedside commode, etc,.)?: Unable Help needed moving to and from a bed to chair (including a wheelchair)?: A Little Help needed walking in hospital room?: A Little Help needed climbing 3-5 steps with a railing? : A Little 6 Click Score: 12    End of Session   Activity Tolerance: Patient tolerated treatment well;Patient limited by lethargy Patient left: in chair;with nursing/sitter in room;with call bell/phone within reach(pharmacist in room ) Nurse Communication: Mobility status(concern regarding impairment ) PT Visit Diagnosis: Unsteadiness on feet (R26.81);Repeated falls (R29.6);Ataxic gait (R26.0);Difficulty in walking, not elsewhere classified (R26.2);Other symptoms and signs  involving the nervous system (R29.898)    Time: 1610-9604 PT Time Calculation (min) (ACUTE ONLY): 22 min   Charges:   PT Evaluation $PT Eval Moderate Complexity: 1 Mod     12:41 PM, 09/16/18 Rosamaria Lints, PT, DPT Physical Therapist - [place] [phone] (Office)   Buccola,Allan C 09/16/2018, 12:33 PM

## 2018-09-16 NOTE — Progress Notes (Signed)
Patient is to be discharged home and in stable condition. Patient's IV and telemetry removed, WNL. Patient given discharge instructions and verbalized understanding. Patient to be escorted out by staff via wheelchair.  Omara Alcon P Dishmon, RN  

## 2018-09-16 NOTE — Care Management Note (Addendum)
Case Management Note  Patient Details  Name: Andrea Miller MRN: [id number] Date of Birth: Apr 23, 1986  Subjective/Objective:    Pulmonary Embolism. From home alone. Has RW, cane pta. Has PCP- friends and family drive her to appointments. Started on Xarelto.  PT eval pending.             Action/Plan: DC home. Xarelto $10 copay card given. Discussed Medicaid services as patient has questions. Has applied for Medicaid and was declined.   ADDENDUM: PT recommends patient continue her OP PT services.   Expected Discharge Date:   09/16/2018               Expected Discharge Plan:     In-House Referral:     Discharge planning Services  CM Consult, Medication Assistance  Post Acute Care Choice:    Choice offered to:  Patient  DME Arranged:    DME Agency:     HH Arranged:    HH Agency:     Status of Service:  In process, will continue to follow  If discussed at Long Length of Stay Meetings, dates discussed:    Additional Comments:  Antara Brecheisen, Chrystine Oiler, RN 09/16/2018, 11:38 AM

## 2018-09-16 NOTE — Progress Notes (Signed)
ANTICOAGULATION CONSULT NOTE - follow up Consult  Pharmacy Consult for Heparin Indication: pulmonary embolus  Allergies  Allergen Reactions  . Cortizone-10 [Hydrocortisone] Shortness Of Breath and Swelling  . Garlic Anaphylaxis  . Prednisone Swelling    Tongue swelling  . Corticosteroids Palpitations    Patient Measurements: Height: 5\' 9"  (175.3 cm) Weight: (!) 360 lb (163.3 kg) IBW/kg (Calculated) : 66.2 HEPARIN DW (KG): 106.9  Vital Signs: Temp: 98.5 F (36.9 C) (10/21 0700) Temp Source: Oral (10/21 0700) BP: 107/62 (10/21 0828) Pulse Rate: 75 (10/21 0828)  Labs: Recent Labs    09/15/18 1340 09/15/18 2308 09/16/18 0615  HGB 13.2  --  11.8*  HCT 44.5  --  40.1  PLT 233  --  222  LABPROT 12.8  --   --   INR 0.97  --   --   HEPARINUNFRC  --  0.85* 0.92*  CREATININE 0.92  --  1.01*    Estimated Creatinine Clearance: 135 mL/min (A) (by C-G formula based on SCr of 1.01 mg/dL (H)).   Medical History: Past Medical History:  Diagnosis Date  . Anxiety   . Asthma   . Bipolar disorder (HCC)   . Chest pain    and angina  . CHF (congestive heart failure) (HCC)   . Depression   . Fibromyalgia   . Hypertension   . 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 Obesity   . Pre-diabetes   . Seizures (HCC)    secondary to anxiety  last seizure 01/25/18  . SVT (supraventricular tachycardia) (HCC)    with hx of syncope; managed by [facility]    Medications:  See med rec  Assessment: 32 yo obese female presents today with chest pain,  tachycardia and pain in her lower back, legs and neck. She fell down the stairs and felt her heart racing.  She also had dyspnea. She had a CT angiogram which showed small bilateral pulmonary emboli. In addition, the patient is on BCP's (norethindrone).  Pharmacy asked to start heparin. Heparin level remains supratherapeutic  Goal of Therapy:  Heparin level 0.3-0.7 units/ml Monitor platelets by anticoagulation  protocol: Yes   Plan:  Decrease  heparin infusion at 1300 units/hr Check anti-Xa level in 6-8 hours and daily while on heparin Continue to monitor H&H and platelets F/U transition to po tx  Elder Cyphers, BS Loura Back, BCPS Clinical Pharmacist Pager [phone] 09/16/2018,8:43 AM

## 2018-09-16 NOTE — Progress Notes (Signed)
*  PRELIMINARY RESULTS* Echocardiogram 2D Echocardiogram has been performed.  Andrea Miller 09/16/2018, 9:38 AM

## 2018-09-16 NOTE — Progress Notes (Signed)
ANTICOAGULATION CONSULT NOTE - follow up Consult  Pharmacy Consult for Heparin--> xarelto Indication: pulmonary embolus  Allergies  Allergen Reactions  . Cortizone-10 [Hydrocortisone] Shortness Of Breath and Swelling  . Garlic Anaphylaxis  . Prednisone Swelling    Tongue swelling  . Corticosteroids Palpitations    Patient Measurements: Height: 5\' 9"  (175.3 cm) Weight: (!) 360 lb (163.3 kg) IBW/kg (Calculated) : 66.2 HEPARIN DW (KG): 106.9  Vital Signs: Temp: 98.5 F (36.9 C) (10/21 0700) Temp Source: Oral (10/21 0700) BP: 107/62 (10/21 0828) Pulse Rate: 75 (10/21 0828)  Labs: Recent Labs    09/15/18 1340 09/15/18 2308 09/16/18 0615  HGB 13.2  --  11.8*  HCT 44.5  --  40.1  PLT 233  --  222  LABPROT 12.8  --   --   INR 0.97  --   --   HEPARINUNFRC  --  0.85* 0.92*  CREATININE 0.92  --  1.01*    Estimated Creatinine Clearance: 135 mL/min (A) (by C-G formula based on SCr of 1.01 mg/dL (H)).   Medical History: Past Medical History:  Diagnosis Date  . Anxiety   . Asthma   . Bipolar disorder (HCC)   . Chest pain    and angina  . CHF (congestive heart failure) (HCC)   . Depression   . Fibromyalgia   . Hypertension   . 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 Obesity   . Pre-diabetes   . Seizures (HCC)    secondary to anxiety  last seizure 01/25/18  . SVT (supraventricular tachycardia) (HCC)    with hx of syncope; managed by [facility]    Medications:  See med rec  Assessment: 32 yo obese female presents today with chest pain,  tachycardia and pain in her lower back, legs and neck. She fell down the stairs and felt her heart racing.  She also had dyspnea. She had a CT angiogram which showed small bilateral pulmonary emboli. In addition, the patient is on BCP's (norethindrone).  Pharmacy asked to transition heparin to xarelto.  Goal of Therapy:  Monitor platelets by anticoagulation protocol: Yes   Plan:  D/C heparin,  transition to po xarelto in an hour. Start Xarelto 15mg  po bid for 3 weeks(21 days), then 20mg po  daily in the evening with a meal Education on xarelto Continue to monitor H&H and platelets   Elder Cyphers, BS Loura Back, BCPS Clinical Pharmacist Pager [phone] 09/16/2018,10:45 AM

## 2018-09-16 NOTE — Discharge Summary (Signed)
Physician Discharge Summary  Andrea Miller:[id number] DOB: 1986-06-25 DOA: 09/15/2018  PCP: Emogene Morgan, MD  Admit date: 09/15/2018 Discharge date: 09/16/2018  Admitted From: Home Disposition: Home  Recommendations for Outpatient Follow-up:  1. Follow up with PCP in 1-2 weeks 2. Please obtain BMP/CBC in one week 3. Follow-up with primary care physician to readdress multiple sedating medications.  Discharge Condition: Stable CODE STATUS: Full code Diet recommendation: Heart healthy  Brief/Interim Summary: 32 year old female with multiple medical problems including bipolar, chronic diastolic heart failure, morbid obesity, hypertension, chronic pain/fibromyalgia, presented to the hospital with left-sided chest discomfort and shortness of breath.  She also had dizziness and a fall prior to admission.  She denied any loss of consciousness.  On arrival to the emergency room, she was diagnosed with bilateral pulmonary emboli.  She was started on anticoagulation.  Provoking agent was likely hormonal supplementation.  She was advised that she had to discontinue her birth control.  During her hospital course, she was noted to be somewhat lethargic.  She was similar presentation at [facility] earlier this month when she presented there with lethargy and passing out.  I suspect this is related to polypharmacy.  Patient reports that she was recently started on Lyrica in the past month and she feels that this does make her very sleepy.  We have reduced her dose to 50 mg twice daily.  She is been advised to follow-up with her primary neurologist/primary care physician to further address polypharmacy issues.  She appears to be back to baseline and feels ready for discharge home.  Discharge Diagnoses:  Principal Problem:   Bil Pulmonary embolism  Active Problems:   Bipolar affective (HCC)   OSA (obstructive sleep apnea)   Hypertension   Chronic diastolic heart failure (HCC)    Lymphedema   Morbid obesity with BMI of 50.0-59.9, adult [facility])    Discharge Instructions  Discharge Instructions    Diet - low sodium heart healthy   Complete by:  As directed    Increase activity slowly   Complete by:  As directed      Allergies as of 09/16/2018      Reactions   Cortizone-10 [hydrocortisone] Shortness Of Breath, Swelling   Garlic Anaphylaxis   Prednisone Swelling   Tongue swelling   Corticosteroids Palpitations      Medication List    STOP taking these medications   norethindrone 0.35 MG tablet Commonly known as:  MICRONOR,CAMILA,ERRIN     TAKE these medications   albuterol (2.5 MG/3ML) 0.083% nebulizer solution Commonly known as:  PROVENTIL Take 2.5 mg by nebulization every 4 (four) hours as needed for wheezing or shortness of breath.   albuterol 108 (90 Base) MCG/ACT inhaler Commonly known as:  PROVENTIL HFA;VENTOLIN HFA Inhale 2 puffs into the lungs every 6 (six) hours as needed for wheezing or shortness of breath.   budesonide-formoterol 160-4.5 MCG/ACT inhaler Commonly known as:  SYMBICORT Inhale 2 puffs into the lungs 2 (two) times daily. What changed:  how much to take   Calcium-Magnesium 500-250 MG Tabs Take 1 tablet by mouth 2 (two) times daily.   clonazePAM 2 MG tablet Commonly known as:  KLONOPIN Take 2 mg by mouth 3 (three) times daily.   DEXILANT 30 MG capsule Generic drug:  Dexlansoprazole Take 1 capsule by mouth daily.   diltiazem 360 MG 24 hr capsule Commonly known as:  CARDIZEM CD Take 360 mg by mouth daily.   escitalopram 20 MG tablet Commonly known as:  LEXAPRO Take 20 mg by mouth daily.   fluticasone 50 MCG/ACT nasal spray Commonly known as:  FLONASE Place 2 sprays into both nostrils daily as needed for allergies.   folic acid 1 MG tablet Commonly known as:  FOLVITE Take 1 mg by mouth daily.   furosemide 20 MG tablet Commonly known as:  LASIX Take 40 mg by mouth 2 (two) times daily.   lactulose 10 GM/15ML  solution Commonly known as:  CHRONULAC Take 30 g by mouth daily as needed for mild constipation.   lamoTRIgine 100 MG tablet Commonly known as:  LAMICTAL Take 200 mg by mouth 2 (two) times daily.   linaclotide 290 MCG Caps capsule Commonly known as:  LINZESS Take 290 mcg by mouth daily before breakfast.   meclizine 25 MG tablet Commonly known as:  ANTIVERT Take 1 tablet by mouth every 4 (four) hours as needed for dizziness.   montelukast 10 MG tablet Commonly known as:  SINGULAIR Take 1 tablet (10 mg total) by mouth at bedtime.   multivitamin-prenatal 27-0.8 MG Tabs tablet Take 1 tablet by mouth daily at 12 noon.   orlistat 120 MG capsule Commonly known as:  XENICAL Take 120 mg by mouth 3 (three) times daily with meals.   potassium chloride 10 MEQ tablet Commonly known as:  K-DUR Take 20 mEq by mouth 2 (two) times daily.   pregabalin 50 MG capsule Commonly known as:  LYRICA Take 1 capsule (50 mg total) by mouth 2 (two) times daily. What changed:    medication strength  how much to take   promethazine 12.5 MG tablet Commonly known as:  PHENERGAN Take 2 tablets (25 mg total) by mouth every 6 (six) hours as needed for nausea or vomiting.   propranolol 40 MG tablet Commonly known as:  INDERAL Take 80 mg by mouth 3 (three) times daily.   risperiDONE 3 MG tablet Commonly known as:  RISPERDAL Take 3 mg by mouth daily.   Rivaroxaban 15 & 20 MG Tbpk Take as directed on package: Start with one 15mg  tablet by mouth twice a day with food. On Day 22, switch to one 20mg  tablet once a day with food.   rosuvastatin 20 MG tablet Commonly known as:  CRESTOR Take 20 mg by mouth daily.   tiZANidine 4 MG tablet Commonly known as:  ZANAFLEX Take 4 mg by mouth 3 (three) times daily.   TROKENDI XR 200 MG Cp24 Generic drug:  Topiramate ER Take 1 capsule by mouth daily.       Allergies  Allergen Reactions  . Cortizone-10 [Hydrocortisone] Shortness Of Breath and Swelling   . Garlic Anaphylaxis  . Prednisone Swelling    Tongue swelling  . Corticosteroids Palpitations    Consultations:     Procedures/Studies: Dg Chest 1 View  Result Date: 09/15/2018 CLINICAL DATA:  Chest pain EXAM: CHEST  1 VIEW COMPARISON:  08/15/2018 FINDINGS: Low volume chest that accentuates cardiomegaly and vascular pedicle widening. There is no edema, consolidation, effusion, or pneumothorax. The lateral left base is excluded from view, there is pending chest CT. IMPRESSION: Low volume chest with cardiomegaly. Electronically Signed   By: Marnee Spring M.D.   On: 09/15/2018 15:35   Dg Thoracic Spine 4v  Result Date: 09/15/2018 CLINICAL DATA:  Fall down stairs. Dizziness. Back pain. Initial encounter. EXAM: THORACIC SPINE - 4+ VIEW COMPARISON:  CT chest 06/22/2018. FINDINGS: There is no evidence of thoracic spine fracture. Alignment is normal. No other significant bone abnormalities are identified. IMPRESSION:  Negative. Electronically Signed   By: Marin Roberts M.D.   On: 09/15/2018 15:30   Dg Lumbar Spine Complete  Result Date: 09/15/2018 CLINICAL DATA:  Fall.  Fall down stairs.  Low back pain. EXAM: LUMBAR SPINE - COMPLETE 4+ VIEW COMPARISON:  None. FINDINGS: Five non rib-bearing lumbar type vertebral bodies are present. Vertebral body heights alignment are maintained. No acute fractures are present. IMPRESSION: Negative. Electronically Signed   By: Marin Roberts M.D.   On: 09/15/2018 15:29   Ct Angio Chest Pe W/cm &/or Wo Cm  Result Date: 09/15/2018 CLINICAL DATA:  Midsternal chest pain radiating to left side of chest beginning this morning. Shortness of breath with nausea and dizziness as well as weakness. Headache. Fall EXAM: CT ANGIOGRAPHY CHEST WITH CONTRAST TECHNIQUE: Multidetector CT imaging of the chest was performed using the standard protocol during bolus administration of intravenous contrast. Multiplanar CT image reconstructions and MIPs were obtained to  evaluate the vascular anatomy. CONTRAST:  ISOVUE-370 IOPAMIDOL (ISOVUE-370) INJECTION 76% COMPARISON:  06/22/2018 FINDINGS: Cardiovascular: Mild cardiomegaly. Thoracic aorta is within normal. Pulmonary arterial system is adequately opacified. There is a subtle filling defect over 2 adjacent proximal left upper lobe arteries. Possible subtle filling defect over a distal right lower lobar pulmonary artery seen best on the coronal images. Mediastinum/Nodes: No mediastinal or hilar adenopathy. Remaining mediastinal structures are unremarkable. Lungs/Pleura: Lungs are adequately inflated without focal airspace consolidation or effusion. Airways are normal. Upper Abdomen: Previous cholecystectomy.  No acute findings. Musculoskeletal: Unremarkable. Review of the MIP images confirms the above findings. IMPRESSION: Findings suggesting small bilateral pulmonary emboli. Mild cardiomegaly. Critical Value/emergent results were called by telephone at the time of interpretation on 09/15/2018 at 4:01 pm to Dr. Eber Hong, who verbally acknowledged these results. Electronically Signed   By: Elberta Fortis M.D.   On: 09/15/2018 16:02   US Venous Img Lower Bilateral  Result Date: 09/16/2018 CLINICAL DATA:  Possible pulmonary emboli on CTA of the chest. EXAM: BILATERAL LOWER EXTREMITY VENOUS DOPPLER ULTRASOUND TECHNIQUE: Gray-scale sonography with graded compression, as well as color Doppler and duplex ultrasound were performed to evaluate the lower extremity deep venous systems from the level of the common femoral vein and including the common femoral, femoral, profunda femoral, popliteal and calf veins including the posterior tibial, peroneal and gastrocnemius veins when visible. The superficial great saphenous vein was also interrogated. Spectral Doppler was utilized to evaluate flow at rest and with distal augmentation maneuvers in the common femoral, femoral and popliteal veins. COMPARISON:  None. FINDINGS: RIGHT LOWER  EXTREMITY Common Femoral Vein: No evidence of thrombus. Normal compressibility, respiratory phasicity and response to augmentation. Saphenofemoral Junction: No evidence of thrombus. Normal compressibility and flow on color Doppler imaging. Profunda Femoral Vein: No evidence of thrombus. Normal compressibility and flow on color Doppler imaging. Femoral Vein: No evidence of thrombus. Normal compressibility, respiratory phasicity and response to augmentation. Popliteal Vein: No evidence of thrombus. Normal compressibility, respiratory phasicity and response to augmentation. Calf Veins: No evidence of thrombus. Normal compressibility and flow on color Doppler imaging. Superficial Great Saphenous Vein: No evidence of thrombus. Normal compressibility. Venous Reflux:  None. Other Findings: No evidence of superficial thrombophlebitis or abnormal fluid collection. LEFT LOWER EXTREMITY Common Femoral Vein: No evidence of thrombus. Normal compressibility, respiratory phasicity and response to augmentation. Saphenofemoral Junction: No evidence of thrombus. Normal compressibility and flow on color Doppler imaging. Profunda Femoral Vein: No evidence of thrombus. Normal compressibility and flow on color Doppler imaging. Femoral Vein: No evidence of  thrombus. Normal compressibility, respiratory phasicity and response to augmentation. Popliteal Vein: No evidence of thrombus. Normal compressibility, respiratory phasicity and response to augmentation. Calf Veins: No evidence of thrombus. Normal compressibility and flow on color Doppler imaging. Superficial Great Saphenous Vein: No evidence of thrombus. Normal compressibility. Venous Reflux:  None. Other Findings: No evidence of superficial thrombophlebitis or abnormal fluid collection. IMPRESSION: No evidence of bilateral lower extremity deep venous thrombosis. Electronically Signed   By: Irish Lack M.D.   On: 09/16/2018 11:23   Dg Knee Complete 4 Views Right  Result Date:  09/15/2018 CLINICAL DATA:  Right knee pain due to an injury suffered in a fall down stairs this morning. Initial encounter. EXAM: RIGHT KNEE - COMPLETE 4+ VIEW COMPARISON:  None. FINDINGS: No evidence of fracture, dislocation, or joint effusion. No evidence of arthropathy or other focal bone abnormality. Soft tissues are unremarkable. IMPRESSION: Negative exam. Electronically Signed   By: Drusilla Kanner M.D.   On: 09/15/2018 15:11   Dg Femur 1v Right  Result Date: 09/15/2018 CLINICAL DATA:  Right upper leg pain due to an injury suffered in a fall down stairs this morning. Initial encounter. EXAM: RIGHT FEMUR 1 VIEW COMPARISON:  None. FINDINGS: There is no evidence of fracture or other focal bone lesions. Soft tissues are unremarkable. IMPRESSION: Negative exam. Electronically Signed   By: Drusilla Kanner M.D.   On: 09/15/2018 15:12       Subjective: Feeling better.  No shortness of breath or chest pain.  Discharge Exam: Vitals:   09/16/18 1430 09/16/18 1621 09/16/18 1624 09/16/18 1634  BP: 92/66 (!) 70/41 (!) 80/32 100/68  Pulse:  67 68   Resp:      Temp:      TempSrc:      SpO2:      Weight:      Height:        General: Pt is alert, awake, not in acute distress Cardiovascular: RRR, S1/S2 +, no rubs, no gallops Respiratory: CTA bilaterally, no wheezing, no rhonchi Abdominal: Soft, NT, ND, bowel sounds + Extremities: no edema, no cyanosis    The results of significant diagnostics from this hospitalization (including imaging, microbiology, ancillary and laboratory) are listed below for reference.     Microbiology: No results found for this or any previous visit (from the past 240 hour(s)).   Labs: BNP (last 3 results) Recent Labs    06/29/18 0816  BNP 49.0   Basic Metabolic Panel: Recent Labs  Lab 09/15/18 1340 09/16/18 0615  NA 141 142  K 3.8 4.0  CL 109 109  CO2 26 27  GLUCOSE 99 98  BUN 14 13  CREATININE 0.92 1.01*  CALCIUM 9.3 9.0   Liver Function  Tests: No results for input(s): AST, ALT, ALKPHOS, BILITOT, PROT, ALBUMIN in the last 168 hours. No results for input(s): LIPASE, AMYLASE in the last 168 hours. No results for input(s): AMMONIA in the last 168 hours. CBC: Recent Labs  Lab 09/15/18 1340 09/16/18 0615  WBC 5.3 4.9  HGB 13.2 11.8*  HCT 44.5 40.1  MCV 87.8 87.9  PLT 233 222   Cardiac Enzymes: No results for input(s): CKTOTAL, CKMB, CKMBINDEX, TROPONINI in the last 168 hours. BNP: Invalid input(s): POCBNP CBG: No results for input(s): GLUCAP in the last 168 hours. D-Dimer Recent Labs    09/15/18 1340  DDIMER 1.42*   Hgb A1c No results for input(s): HGBA1C in the last 72 hours. Lipid Profile No results for input(s): CHOL, HDL, LDLCALC,  TRIG, CHOLHDL, LDLDIRECT in the last 72 hours. Thyroid function studies No results for input(s): TSH, T4TOTAL, T3FREE, THYROIDAB in the last 72 hours.  Invalid input(s): FREET3 Anemia work up No results for input(s): VITAMINB12, FOLATE, FERRITIN, TIBC, IRON, RETICCTPCT in the last 72 hours. Urinalysis    Component Value Date/Time   COLORURINE YELLOW (A) 09/04/2018 1948   APPEARANCEUR CLEAR (A) 09/04/2018 1948   LABSPEC 1.006 09/04/2018 1948   PHURINE 6.0 09/04/2018 1948   GLUCOSEU NEGATIVE 09/04/2018 1948   HGBUR NEGATIVE 09/04/2018 1948   BILIRUBINUR NEGATIVE 09/04/2018 1948   KETONESUR NEGATIVE 09/04/2018 1948   PROTEINUR NEGATIVE 09/04/2018 1948   NITRITE POSITIVE (A) 09/04/2018 1948   LEUKOCYTESUR NEGATIVE 09/04/2018 1948   Sepsis Labs Invalid input(s): PROCALCITONIN,  WBC,  LACTICIDVEN Microbiology No results found for this or any previous visit (from the past 240 hour(s)).   Time coordinating discharge:  SIGNED:   Erick Blinks, MD  [facility] 09/16/2018, 8:39 PM Pager   If 7PM-7AM, please contact night-coverage [email or web address] Password [email or web address]

## 2018-09-17 LAB — HEMOGLOBIN A1C
Hgb A1c MFr Bld: 5.9 % — ABNORMAL HIGH (ref 4.8–5.6)
Mean Plasma Glucose: 123 mg/dL

## 2018-09-17 LAB — HIV ANTIBODY (ROUTINE TESTING W REFLEX): HIV Screen 4th Generation wRfx: NONREACTIVE

## 2018-09-24 ENCOUNTER — Other Ambulatory Visit: Payer: Self-pay

## 2018-09-24 ENCOUNTER — Emergency Department: Payer: BLUE CROSS/BLUE SHIELD

## 2018-09-24 ENCOUNTER — Emergency Department
Admission: EM | Admit: 2018-09-24 | Discharge: 2018-09-24 | Disposition: A | Discharge status: Home / Self Care | Payer: BLUE CROSS/BLUE SHIELD | Attending: Emergency Medicine | Admitting: Emergency Medicine

## 2018-09-24 DIAGNOSIS — Z79899 Other long term (current) drug therapy: Secondary | ICD-10-CM | POA: Insufficient documentation

## 2018-09-24 DIAGNOSIS — N920 Excessive and frequent menstruation with regular cycle: Secondary | ICD-10-CM

## 2018-09-24 DIAGNOSIS — J45909 Unspecified asthma, uncomplicated: Secondary | ICD-10-CM | POA: Diagnosis not present

## 2018-09-24 DIAGNOSIS — I110 Hypertensive heart disease with heart failure: Secondary | ICD-10-CM | POA: Insufficient documentation

## 2018-09-24 DIAGNOSIS — R079 Chest pain, unspecified: Secondary | ICD-10-CM

## 2018-09-24 DIAGNOSIS — Z7901 Long term (current) use of anticoagulants: Secondary | ICD-10-CM

## 2018-09-24 DIAGNOSIS — I5032 Chronic diastolic (congestive) heart failure: Secondary | ICD-10-CM | POA: Insufficient documentation

## 2018-09-24 LAB — BASIC METABOLIC PANEL
Anion gap: 8 (ref 5–15)
BUN: 7 mg/dL (ref 6–20)
CO2: 30 mmol/L (ref 22–32)
Calcium: 9.2 mg/dL (ref 8.9–10.3)
Chloride: 106 mmol/L (ref 98–111)
Creatinine, Ser: 0.87 mg/dL (ref 0.44–1.00)
GFR calc Af Amer: >60 mL/min (ref >60)
GFR calc non Af Amer: >60 mL/min (ref >60)
Glucose, Bld: 111 mg/dL — ABNORMAL HIGH (ref 70–99)
Potassium: 3.3 mmol/L — ABNORMAL LOW (ref 3.5–5.1)
Sodium: 144 mmol/L (ref 135–145)

## 2018-09-24 LAB — CBC
HCT: 33.5 % — ABNORMAL LOW (ref 36.0–46.0)
Hemoglobin: 10.5 g/dL — ABNORMAL LOW (ref 12.0–15.0)
MCH: 27.1 pg (ref 26.0–34.0)
MCHC: 31.3 g/dL (ref 30.0–36.0)
MCV: 86.6 fL (ref 80.0–100.0)
Platelets: 228 10*3/uL (ref 150–400)
RBC: 3.87 MIL/uL (ref 3.87–5.11)
RDW: 14.4 % (ref 11.5–15.5)
WBC: 4.6 10*3/uL (ref 4.0–10.5)
nRBC: 0 % (ref 0.0–0.2)

## 2018-09-24 LAB — TROPONIN I: Troponin I: <0.03 ng/mL (ref <0.03)

## 2018-09-24 MED ORDER — IOPAMIDOL (ISOVUE-370) INJECTION 76%
75.0000 mL | Freq: Once | INTRAVENOUS | Status: AC | PRN
  Administered 2018-09-24: 75 mL via INTRAVENOUS

## 2018-09-24 MED ORDER — SODIUM CHLORIDE 0.9 % IV BOLUS
500.0000 mL | Freq: Once | INTRAVENOUS | Status: AC
  Administered 2018-09-24: 500 mL via INTRAVENOUS

## 2018-09-24 NOTE — ED Triage Notes (Signed)
Pt in via [facility] from home with c/o fall. EMS reports pt fell last week, was seen at [facility], found a blood clot in her lung, put her on meds and pt has been feeling weak with an ongoing menstrual cycle since then.

## 2018-09-24 NOTE — ED Provider Notes (Signed)
[facility] Emergency Department Provider Note  ____________________________________________   First MD Initiated Contact with Patient 09/24/18 [phone]     (approximate)  I have reviewed the triage vital signs and the nursing notes.   HISTORY  Chief Complaint Chest Pain    HPI Andrea Miller is a 32 y.o. female with extensive medical history as listed below who last week was diagnosed with small bilateral pulmonary emboli thought to be secondary to oral contraceptive pills.  She was started on Xarelto and will follow-up as an outpatient.  She Artie has a cardiologist, Dr. Welton Flakes with [facility], for her prior issues with SVT and orthostasis.  She presents tonight by EMS for evaluation of chest pain.  She says that she felt okay while she was in the hospital at [facility] last week, but over the last couple of days she has developed upper chest pain as well as upper back pain and some increased shortness of breath.  She describes the symptoms as severe at times although they are currently mild.  Exertion and positional changes make the symptoms worse and rest makes them better.  She also reports that she started having her period while she was in the hospital last week.  Normally it is relatively brief and somewhat irregular, but she has had bleeding consistently for a week and reports that it is heavy.  Overall she says she feels weak and tired and was concerned her blood count would be low.  She says she has been compliant with her medications.  She denies fever/chills, nausea, vomiting, abdominal pain, and dysuria.  Past Medical History:  Diagnosis Date  . Anxiety   . Asthma   . Bipolar disorder (HCC)   . Chest pain    and angina  . CHF (congestive heart failure) (HCC)   . Depression   . Fibromyalgia   . Hypertension   . 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 Obesity   . Pre-diabetes   . Seizures (HCC)    secondary to  anxiety  last seizure 01/25/18  . SVT (supraventricular tachycardia) (HCC)    with hx of syncope; managed by [facility]    Patient Active Problem List   Diagnosis Date Noted  . Bil Pulmonary embolism  09/15/2018  . Morbid obesity with BMI of 50.0-59.9, adult (HCC) 09/15/2018  . Chronic diastolic heart failure (HCC) 07/01/2018  . Lymphedema 07/01/2018  . Chronic cholecystitis   . Edema 02/13/2018  . Diabetes mellitus type 2, uncomplicated (HCC) 02/13/2018  . Hypertension 11/23/2017  . Asthma exacerbation 04/14/2017  . Asthma without status asthmaticus 01/16/2017  . Bipolar affective (HCC) 01/16/2017  . Coronary artery disease 01/16/2017  . Syncope 12/12/2016  . Fibromyalgia 04/27/2014  . OSA (obstructive sleep apnea) 04/27/2014  . Seizure-like activity (HCC) 04/27/2014  . Migraine without status migrainosus, not intractable 04/27/2014  . Obesity 04/15/2014  . Sleep disorder 04/15/2014  . Neck pain 04/15/2014  . Headache 04/15/2014  . Restless legs syndrome 04/15/2014  . Supraventricular tachycardia (HCC) 07/13/2009    Past Surgical History:  Procedure Laterality Date  . CHOLECYSTECTOMY N/A 02/22/2018   Procedure: LAPAROSCOPIC CHOLECYSTECTOMY;  Surgeon: Ancil Linsey, MD;  Location: [facility] ORS;  Service: General;  Laterality: N/A;  . TONSILLECTOMY AND ADENOIDECTOMY N/A 09/05/2016   Procedure: TONSILLECTOMY AND ADENOIDECTOMY;  Surgeon: Linus Salmons, MD;  Location: [facility] ORS;  Service: ENT;  Laterality: N/A;    Prior to Admission medications   Medication  Sig Start Date End Date Taking? Authorizing Provider  albuterol (PROVENTIL HFA;VENTOLIN HFA) 108 (90 Base) MCG/ACT inhaler Inhale 2 puffs into the lungs every 6 (six) hours as needed for wheezing or shortness of breath. 07/31/18   Merwyn Katos, MD  albuterol (PROVENTIL) (2.5 MG/3ML) 0.083% nebulizer solution Take 2.5 mg by nebulization every 4 (four) hours as needed for wheezing or shortness of breath.    [provider]  budesonide-formoterol (SYMBICORT) 160-4.5 MCG/ACT inhaler Inhale 2 puffs into the lungs 2 (two) times daily. Patient taking differently: Inhale 1 puff into the lungs 2 (two) times daily.  01/31/18   Merwyn Katos, MD  Calcium-Magnesium 500-250 MG TABS Take 1 tablet by mouth 2 (two) times daily.     [provider]  clonazePAM (KLONOPIN) 2 MG tablet Take 2 mg by mouth 3 (three) times daily.     [provider]  DEXILANT 30 MG capsule Take 1 capsule by mouth daily. 08/28/18   [provider]  diltiazem (CARDIZEM CD) 360 MG 24 hr capsule Take 360 mg by mouth daily.     [provider]  escitalopram (LEXAPRO) 20 MG tablet Take 20 mg by mouth daily.    [provider]  fluticasone (FLONASE) 50 MCG/ACT nasal spray Place 2 sprays into both nostrils daily as needed for allergies.     [provider]  folic acid (FOLVITE) 1 MG tablet Take 1 mg by mouth daily.  12/15/16   [provider]  furosemide (LASIX) 20 MG tablet Take 40 mg by mouth 2 (two) times daily.     [provider]  lactulose (CHRONULAC) 10 GM/15ML solution Take 30 g by mouth daily as needed for mild constipation.  08/28/18   [provider]  lamoTRIgine (LAMICTAL) 100 MG tablet Take 200 mg by mouth 2 (two) times daily.     [provider]  linaclotide (LINZESS) 290 MCG CAPS capsule Take 290 mcg by mouth daily before breakfast.    [provider]  meclizine (ANTIVERT) 25 MG tablet Take 1 tablet by mouth every 4 (four) hours as needed for dizziness. 08/27/18   [provider]  montelukast (SINGULAIR) 10 MG tablet Take 1 tablet (10 mg total) by mouth at bedtime. 01/31/18 01/31/19  Merwyn Katos, MD  orlistat (XENICAL) 120 MG capsule Take 120 mg by mouth 3 (three) times daily with meals.    [provider]  potassium chloride (K-DUR) 10 MEQ tablet Take 20 mEq by mouth 2 (two) times daily.     [provider]    pregabalin (LYRICA) 50 MG capsule Take 1 capsule (50 mg total) by mouth 2 (two) times daily. 09/16/18   Erick Blinks, MD  Prenatal Vit-Fe Fumarate-FA (MULTIVITAMIN-PRENATAL) 27-0.8 MG TABS tablet Take 1 tablet by mouth daily at 12 noon.    [provider]  promethazine (PHENERGAN) 12.5 MG tablet Take 2 tablets (25 mg total) by mouth every 6 (six) hours as needed for nausea or vomiting. 02/20/18   Governor Rooks, MD  propranolol (INDERAL) 40 MG tablet Take 80 mg by mouth 3 (three) times daily.     [provider]  risperiDONE (RISPERDAL) 3 MG tablet Take 3 mg by mouth daily.     [provider]  Rivaroxaban 15 & 20 MG TBPK Take as directed on package: Start with one 15mg  tablet by mouth twice a day with food. On Day 22, switch to one 20mg  tablet once a day with food. 09/16/18  Erick Blinks, MD  rosuvastatin (CRESTOR) 20 MG tablet Take 20 mg by mouth daily.    [provider]  tiZANidine (ZANAFLEX) 4 MG tablet Take 4 mg by mouth 3 (three) times daily.  03/26/17   [provider]  Topiramate ER (TROKENDI XR) 200 MG CP24 Take 1 capsule by mouth daily.     [provider]    Allergies Cortizone-10 [hydrocortisone]; Garlic; Prednisone; and Corticosteroids  Family History  Problem Relation Age of Onset  . Diabetes Mother   . Hypertension Mother   . Asthma Mother   . Gallstones Mother   . Diabetes Father   . Hypertension Father   . Gallstones Father     Social History Social History   Tobacco Use  . Smoking status: Never Smoker  . Smokeless tobacco: Never Used  Substance Use Topics  . Alcohol use: No  . Drug use: No    Review of Systems Constitutional: No fever/chills.  General malaise and fatigue. Eyes: No visual changes. ENT: No sore throat. Cardiovascular: Chest pain as described above Respiratory: Shortness of breath as described above Gastrointestinal: No abdominal pain.  No nausea, no vomiting.  No diarrhea.  No  constipation. Genitourinary: Heavy vaginal bleeding as described above.  Negative for dysuria. Musculoskeletal: Negative for neck pain.  Negative for back pain. Integumentary: Negative for rash. Neurological: Negative for headaches, focal weakness or numbness.   ____________________________________________   PHYSICAL EXAM:  VITAL SIGNS: ED Triage Vitals  Enc Vitals Group     BP 09/24/18 0249 124/69     Pulse Rate 09/24/18 0249 93     Resp 09/24/18 0249 16     Temp 09/24/18 0249 98.9 F (37.2 C)     Temp Source 09/24/18 0249 Oral     SpO2 09/24/18 0249 100 %     Weight 09/24/18 0250 (!) 163.3 kg (360 lb)     Height 09/24/18 0250 1.753 m (5\' 9" )     Head Circumference --      Peak Flow --      Pain Score 09/24/18 0256 8     Pain Loc --      Pain Edu? --      Excl. in GC? --     Constitutional: Alert and oriented. Well appearing and in no acute distress. Eyes: Conjunctivae are normal.  Head: Atraumatic. Nose: No congestion/rhinnorhea. Mouth/Throat: Mucous membranes are moist. Neck: No stridor.  No meningeal signs.   Cardiovascular: Normal rate, regular rhythm. Good peripheral circulation. Grossly normal heart sounds. Respiratory: Normal respiratory effort.  No retractions. Lungs CTAB. Gastrointestinal: Morbid obesity.  Soft and nontender. No distention.  GU:  deferred Musculoskeletal: No lower extremity tenderness nor edema. No gross deformities of extremities. Neurologic:  Normal speech and language. No gross focal neurologic deficits are appreciated.  Skin:  Skin is warm, dry and intact. No rash noted. Psychiatric: Mood and affect are normal. Speech and behavior are normal.  ____________________________________________   LABS (all labs ordered are listed, but only abnormal results are displayed)  Labs Reviewed  BASIC METABOLIC PANEL - Abnormal; Notable for the following components:      Result Value   Potassium 3.3 (*)    Glucose, Bld 111 (*)    All other  components within normal limits  CBC - Abnormal; Notable for the following components:   Hemoglobin 10.5 (*)    HCT 33.5 (*)    All other components within normal limits  TROPONIN I  POC URINE PREG, ED  ____________________________________________  EKG  ED ECG REPORT I, Loleta Rose, the attending physician, personally viewed and interpreted this ECG.  Date: 09/24/2018 EKG Time: 3:01 AM Rate: 83 Rhythm: normal sinus rhythm QRS Axis: normal Intervals: normal ST/T Wave abnormalities: Deep Q waves in lead III, less visible and aVF, otherwise generally unremarkable EKG Narrative Interpretation: no evidence of acute ischemia   ____________________________________________  RADIOLOGY I, Loleta Rose, personally viewed and evaluated these images (plain radiographs) as part of my medical decision making, as well as reviewing the written report by the radiologist.  ED MD interpretation: No evidence of acute abnormality on chest x-ray.  Official radiology report(s): Dg Chest 2 View  Result Date: 09/24/2018 CLINICAL DATA:  32 year old female with chest pain. EXAM: CHEST - 2 VIEW COMPARISON:  Chest CT dated 09/15/2018 FINDINGS: The heart size and mediastinal contours are within normal limits. Both lungs are clear. The visualized skeletal structures are unremarkable. IMPRESSION: No active cardiopulmonary disease. Electronically Signed   By: Elgie Collard M.D.   On: 09/24/2018 04:19   Ct Angio Chest Pe W/cm &/or Wo Cm  Result Date: 09/24/2018 CLINICAL DATA:  32 year old female with chest pain. History of recent bilateral small pulmonary emboli. Patient is on blood thinners. EXAM: CT ANGIOGRAPHY CHEST WITH CONTRAST TECHNIQUE: Multidetector CT imaging of the chest was performed using the standard protocol during bolus administration of intravenous contrast. Multiplanar CT image reconstructions and MIPs were obtained to evaluate the vascular anatomy. CONTRAST:  75mL ISOVUE-370 IOPAMIDOL  (ISOVUE-370) INJECTION 76% COMPARISON:  Chest radiograph dated 09/14/2018 and CT dated 09/15/2018 FINDINGS: Cardiovascular: There is mild cardiomegaly. No pericardial effusion. The thoracic aorta is unremarkable. Evaluation of the pulmonary arteries is limited due to suboptimal opacification and timing of the contrast. No large or central pulmonary artery embolus identified. Mediastinum/Nodes: There is no hilar or mediastinal adenopathy. Esophagus and the thyroid gland are grossly unremarkable. No mediastinal fluid collection. Lungs/Pleura: The lungs are clear. There is no pleural effusion or pneumothorax. The central airways are patent. Upper Abdomen: Cholecystectomy. Probable mild fatty liver. The visualized upper abdomen is otherwise unremarkable. Musculoskeletal: No chest wall abnormality. No acute or significant osseous findings. Review of the MIP images confirms the above findings. IMPRESSION: No acute intrathoracic pathology. No CT evidence of central pulmonary artery embolus. Electronically Signed   By: Elgie Collard M.D.   On: 09/24/2018 06:55    ____________________________________________   PROCEDURES  Critical Care performed: No   Procedure(s) performed:   Procedures   ____________________________________________   INITIAL IMPRESSION / ASSESSMENT AND PLAN / ED COURSE  As part of my medical decision making, I reviewed the following data within the electronic MEDICAL RECORD NUMBER Nursing notes reviewed and incorporated, Labs reviewed , EKG interpreted , Old chart reviewed and Notes from prior ED visits    Differential diagnosis includes, but is not limited to, persistent pain secondary to her previously identified bilateral pulmonary emboli, musculoskeletal pain, worsening pulmonary emboli not adequately treated by Xarelto, healthcare associated pneumonia, pneumothorax.  She is in no respiratory distress with no accessory muscle usage, no tachypnea, and clear lung sounds  throughout.  Her heart rate is hovering right below 100.   I explained to the patient that she can expect to have heavier than usual menstrual bleeding as a result of her anticoagulation.  I also explained that typically I would start someone on oral contraceptives to try to slow down or stop the bleeding, but obviously that is not appropriate in her case.  She is comfortable with  the plan for outpatient follow-up with her OB/GYN to discuss other treatment options.  There is no indication for an emergent pelvic exam at this time.  Regarding the chest pain or shortness of breath in the setting of a known pulmonary embolism diagnosis, from an emergency department perspective, the main issue we need to verify is whether or not the clot burden is worsening in spite of the Xarelto.  I doubt this will be the case given her stable vitals and no apparent distress, but based on the symptoms she is describing I think that I must obtain a repeat CTA chest to look for any evidence of acute embolism or right heart strain given that the last scan indicated only small bilateral PEs.  She is comfortable with this plan.  If her CTA is reassuring with no sign of acute clot, she can be discharged for outpatient follow-up.  She agrees with the plan.  Clinical Course as of Sep 24 710  Tue Sep 24, 2018  0706 The patient continues to be in no distress.  Her vital signs remained stable.  Her CTA was unremarkable and without any evidence of acute or emergent abnormality; although apparently the timing of the contrast bolus was suboptimal, we still have sufficient imaging to rule out the need for admission or additional treatment, and this coupled with the unremarkable physical exam and normal vital signs is very reassuring.I gave the patient the good news and encouraged outpatient follow-up with her PCP, cardiologist, and OB/GYN.  I gave my usual customary return precautions and she understands and agrees with the plan.  I  encouraged her to continue taking her medication until someone specifically discontinues to the Xarelto after an appropriate period.   [CF]    Clinical Course User Index [CF] Loleta Rose, MD    ____________________________________________  FINAL CLINICAL IMPRESSION(S) / ED DIAGNOSES  Final diagnoses:  Chest pain, unspecified type  Menorrhagia with regular cycle  On continuous oral anticoagulation     MEDICATIONS GIVEN DURING THIS VISIT:  Medications  sodium chloride 0.9 % bolus 500 mL (500 mLs Intravenous New Bag/Given 09/24/18 0605)  iopamidol (ISOVUE-370) 76 % injection 75 mL (75 mLs Intravenous Contrast Given 09/24/18 1610)     ED Discharge Orders    None       Note:  This document was prepared using Dragon voice recognition software and may include unintentional dictation errors.    Loleta Rose, MD 09/24/18 [id number]

## 2018-09-24 NOTE — Discharge Instructions (Signed)
Your workup in the Emergency Department today was reassuring.  We did not find any specific abnormalities.  We recommend you drink plenty of fluids, take your regular medications and/or any new ones prescribed today, and follow up with the doctor(s) listed in these documents as recommended.  Return to the Emergency Department if you develop new or worsening symptoms that concern you.  

## 2018-09-24 NOTE — ED Triage Notes (Signed)
Patient reports here tonight due to chest pain.  Reports fell last week and dx'd with a blood clot in her lungs.  Reports taking blood thinners.  States started menstrual a week ago and still having heavy bleeding and is concerned since taking the blood thinners.

## 2018-09-30 ENCOUNTER — Ambulatory Visit: Payer: BLUE CROSS/BLUE SHIELD | Attending: Radiation Oncology | Admitting: Radiation Oncology

## 2018-10-01 NOTE — Progress Notes (Signed)
Please place orders in Epic as patient is being scheduled for a pre-op appointment! Thank you! 

## 2018-10-04 ENCOUNTER — Other Ambulatory Visit: Payer: Self-pay

## 2018-10-09 NOTE — H&P (Addendum)
Andrea Miller Documented: 08/08/2018 10:29 AM Location:  Office Patient #: [id number] DOB: [birth year] Divorced / Language: English / Race: Black or African American Female   History of Present Illness Andrea Hazard B. Andrea Deutscher MD; 08/08/2018 11:05 AM) The patient is a 32 year old female who presents for a bariatric surgery evaluation. The patient and her mother came in today for consultation regarding weight loss surgery. [name] is a 32 year old African-American female with a weight of 371 and a BMI of 55. She has issues with obstructive sleep apnea asthma for which she sees Dr. Remer Macho, SVT, and high cholesterol. She came prepared for a visit with a good letter explaining all the frustration she's had in all the different modalities that she is used to try to lose weight and keep weight off. These include Optifast, behavioral modification, inpatient Weight Watchers, herbal life, Nutri systems, Slim fast, liquid proteins another high-protein diets. She's been very frustrated but is focused on proceeding with a gastric bypass.  I discussed sleeve gastrectomy and gastric bypass in detail and I think she would like to proceed with a gastric bypass. I mentioned issues with some medications but that does not seem to be a problem with her. She has had a laparoscopic cholecystectomy but no other abdominal surgery.  I think that she would be a good candidate for gastric bypass.  She had a workup for small bilateral PE;  Doppler study of lower extremity did not show residual thrombus.     Past Surgical History Andrea Miller, CMA; 08/08/2018 10:30 AM) Gallbladder Surgery - Laparoscopic   Diagnostic Studies History Andrea Miller, CMA; 08/08/2018 10:30 AM) Colonoscopy  never Pap Smear  1-5 years ago  Allergies Andrea Miller, CMA; 08/08/2018 10:42 AM) Corticosteroids  Swelling, Shortness of breath. Garlic *ALTERNATIVE MEDICINES*  No Known Drug Allergies [01/24/2018]: (Marked as  Inactive)  Medication History (Andrea Miller, CMA; 08/08/2018 10:40 AM) Albuterol Sulfate ((2.5 MG/3ML)0.083% Nebulized Soln, Inhalation) Active. ProAir HFA (108 (90 Base)MCG/ACT Aerosol Soln, Inhalation) Active. Symbicort (160-4.5MCG/ACT Aerosol, Inhalation) Active. clonazePAM (2MG  Tablet, Oral) Active. Dexilant (30MG  Capsule DR, Oral) Active. Escitalopram Oxalate (20MG  Tablet, Oral) Active. Fluticasone Propionate (50MCG/ACT Suspension, Nasal) Active. Folic Acid (1MG  Tablet, Oral) Active. Furosemide (40MG  Tablet, Oral) Active. lamoTRIgine (200MG  Tablet, Oral) Active. Linzess ( Capsule, Oral) Active. Singulair (10MG  Tablet, Oral) Active. Norethindrone (0.35MG  Tablet, Oral) Active. Potassium Chloride Crys ER ( Tablet ER, Oral) Active. Pregabalin (100MG  Capsule, Oral) Active. Multivitamins (Oral) Active. Propranolol HCl (40MG  Tablet, Oral) Active. risperiDONE (3MG  Tablet, Oral) Active. Rosuvastatin Calcium (20MG  Tablet, Oral) Active. Trokendi XR (200MG  Capsule ER 24HR, Oral) Active. Medications Reconciled  Social History Andrea Miller, CMA; 08/08/2018 10:30 AM) No alcohol use  No caffeine use  No drug use  Tobacco use  Never smoker.  Family History Andrea Miller, CMA; 08/08/2018 10:30 AM) Arthritis  Father, Mother. Cerebrovascular Accident  Mother. Depression  Brother, Father, Mother, Sister. Diabetes Mellitus  Father, Mother. Heart Disease  Mother. Hypertension  Brother, Father, Mother. Migraine Headache  Brother, Father, Mother, Sister. Respiratory Condition  Brother, Father, Mother. Seizure disorder  Mother. Thyroid problems  Mother.  Pregnancy / Birth History Andrea Miller, CMA; 08/08/2018 10:30 AM) Age at menarche  16 years, 18 years. Contraceptive History  Depo-provera, Oral contraceptives. Gravida  0 Para  0 Regular periods   Other Problems Andrea Miller, CMA; 08/08/2018 10:30 AM) Anxiety Disorder  Asthma   Back Pain  Chest pain  Heart murmur  High blood pressure  Migraine Headache  Other disease, cancer, significant illness  Seizure Disorder  Sleep Apnea     Review of Systems (Andrea Miller CMA; 08/08/2018 10:30 AM) General Present- Appetite Loss, Fatigue and Weight Loss. Not Present- Chills, Fever, Night Sweats and Weight Gain. Skin Not Present- Change in Wart/Mole, Dryness, Hives, Jaundice, New Lesions, Non-Healing Wounds, Rash and Ulcer. HEENT Present- Seasonal Allergies and Wears glasses/contact lenses. Not Present- Earache, Hearing Loss, Hoarseness, Nose Bleed, Oral Ulcers, Ringing in the Ears, Sinus Pain, Sore Throat, Visual Disturbances and Yellow Eyes. Respiratory Present- Snoring. Not Present- Bloody sputum, Chronic Cough, Difficulty Breathing and Wheezing. Breast Not Present- Breast Mass, Breast Pain, Nipple Discharge and Skin Changes. Cardiovascular Present- Chest Pain, Difficulty Breathing Lying Down, Leg Cramps, Palpitations, Rapid Heart Rate, Shortness of Breath and Swelling of Extremities. Gastrointestinal Not Present- Abdominal Pain, Bloating, Bloody Stool, Change in Bowel Habits, Chronic diarrhea, Constipation, Difficulty Swallowing, Excessive gas, Gets full quickly at meals, Hemorrhoids, Indigestion, Nausea, Rectal Pain and Vomiting. Female Genitourinary Not Present- Frequency, Nocturia, Painful Urination, Pelvic Pain and Urgency. Musculoskeletal Present- Back Pain, Joint Pain, Joint Stiffness, Muscle Pain, Muscle Weakness and Swelling of Extremities. Neurological Present- Decreased Memory, Fainting, Headaches, Numbness, Seizures, Tingling, Trouble walking and Weakness. Not Present- Tremor. Psychiatric Present- Anxiety, Bipolar and Fearful. Not Present- Change in Sleep Pattern, Depression and Frequent crying. Hematology Not Present- Blood Thinners, Easy Bruising, Excessive bleeding, Gland problems, HIV and Persistent Infections.  Vitals (Andrea Miller CMA; 08/08/2018  10:42 AM) 08/08/2018 10:42 AM Weight: 371 lb Height: 68.75in Body Surface Area: 2.68 m Body Mass Index: 55.19 kg/m  Pulse: 84 (Regular)  BP: 130/84 (Sitting, Left Arm, Standard)       Physical Exam (Archana Eckman B. Andrea Deutscher MD; 08/08/2018 11:06 AM) The physical exam findings are as follows: Note:pleasant and articulate obese African-American female in no acute distress HEENT exam unremarkable Neck supple Chest clear Heart sinus rhythm without murmurs Abdomen obese with prior laparoscopic cholecystectomy incisions Extremities exam large --there is a recent history of DVT (October 2019)    Assessment & Plan Andrea Hazard B. Andrea Deutscher MD; 08/08/2018 11:08 AM) MORBID OBESITY, BMI 55  For roux en Y gastric bypass--will need to delay her surgery for ~ 6 months from October when she had this BCP related PE.    Matt B. Andrea Deutscher, MD, FACS

## 2018-10-11 ENCOUNTER — Other Ambulatory Visit: Payer: Self-pay

## 2018-10-11 ENCOUNTER — Inpatient Hospital Stay: Payer: BLUE CROSS/BLUE SHIELD

## 2018-10-11 ENCOUNTER — Encounter: Payer: Self-pay | Admitting: Emergency Medicine

## 2018-10-11 ENCOUNTER — Inpatient Hospital Stay: Payer: BLUE CROSS/BLUE SHIELD | Attending: Hematology and Oncology | Admitting: Hematology and Oncology

## 2018-10-11 ENCOUNTER — Inpatient Hospital Stay
Admission: EM | Admit: 2018-10-11 | Discharge: 2018-10-13 | DRG: 812 | Disposition: A | Discharge status: Home / Self Care | Payer: Medicare Other | Source: Ambulatory Visit | Attending: Family Medicine | Admitting: Family Medicine

## 2018-10-11 ENCOUNTER — Encounter: Payer: Self-pay | Admitting: Hematology and Oncology

## 2018-10-11 VITALS — BP 135/92 | HR 97 | Temp 97.2°F | Resp 20 | Ht 69.69 in | Wt 369.0 lb

## 2018-10-11 DIAGNOSIS — D649 Anemia, unspecified: Secondary | ICD-10-CM | POA: Diagnosis present

## 2018-10-11 DIAGNOSIS — I5032 Chronic diastolic (congestive) heart failure: Secondary | ICD-10-CM | POA: Diagnosis present

## 2018-10-11 DIAGNOSIS — R000 Tachycardia, unspecified: Secondary | ICD-10-CM

## 2018-10-11 DIAGNOSIS — I110 Hypertensive heart disease with heart failure: Secondary | ICD-10-CM | POA: Diagnosis present

## 2018-10-11 DIAGNOSIS — Z79899 Other long term (current) drug therapy: Secondary | ICD-10-CM

## 2018-10-11 DIAGNOSIS — T45515A Adverse effect of anticoagulants, initial encounter: Secondary | ICD-10-CM | POA: Diagnosis present

## 2018-10-11 DIAGNOSIS — Z833 Family history of diabetes mellitus: Secondary | ICD-10-CM | POA: Diagnosis not present

## 2018-10-11 DIAGNOSIS — E538 Deficiency of other specified B group vitamins: Secondary | ICD-10-CM | POA: Diagnosis present

## 2018-10-11 DIAGNOSIS — D500 Iron deficiency anemia secondary to blood loss (chronic): Secondary | ICD-10-CM | POA: Insufficient documentation

## 2018-10-11 DIAGNOSIS — Z7951 Long term (current) use of inhaled steroids: Secondary | ICD-10-CM | POA: Diagnosis not present

## 2018-10-11 DIAGNOSIS — Z8249 Family history of ischemic heart disease and other diseases of the circulatory system: Secondary | ICD-10-CM | POA: Insufficient documentation

## 2018-10-11 DIAGNOSIS — N920 Excessive and frequent menstruation with regular cycle: Secondary | ICD-10-CM | POA: Insufficient documentation

## 2018-10-11 DIAGNOSIS — Z791 Long term (current) use of non-steroidal anti-inflammatories (NSAID): Secondary | ICD-10-CM | POA: Insufficient documentation

## 2018-10-11 DIAGNOSIS — F319 Bipolar disorder, unspecified: Secondary | ICD-10-CM | POA: Diagnosis present

## 2018-10-11 DIAGNOSIS — M797 Fibromyalgia: Secondary | ICD-10-CM | POA: Diagnosis present

## 2018-10-11 DIAGNOSIS — Z86711 Personal history of pulmonary embolism: Secondary | ICD-10-CM

## 2018-10-11 DIAGNOSIS — D509 Iron deficiency anemia, unspecified: Secondary | ICD-10-CM | POA: Diagnosis not present

## 2018-10-11 DIAGNOSIS — I2699 Other pulmonary embolism without acute cor pulmonale: Secondary | ICD-10-CM | POA: Diagnosis not present

## 2018-10-11 DIAGNOSIS — R823 Hemoglobinuria: Secondary | ICD-10-CM | POA: Diagnosis present

## 2018-10-11 DIAGNOSIS — I2694 Multiple subsegmental pulmonary emboli without acute cor pulmonale: Secondary | ICD-10-CM

## 2018-10-11 DIAGNOSIS — D6489 Other specified anemias: Secondary | ICD-10-CM | POA: Diagnosis present

## 2018-10-11 DIAGNOSIS — Z888 Allergy status to other drugs, medicaments and biological substances status: Secondary | ICD-10-CM | POA: Diagnosis not present

## 2018-10-11 DIAGNOSIS — R42 Dizziness and giddiness: Secondary | ICD-10-CM

## 2018-10-11 DIAGNOSIS — E669 Obesity, unspecified: Secondary | ICD-10-CM | POA: Diagnosis not present

## 2018-10-11 DIAGNOSIS — Z825 Family history of asthma and other chronic lower respiratory diseases: Secondary | ICD-10-CM | POA: Diagnosis not present

## 2018-10-11 DIAGNOSIS — Z7901 Long term (current) use of anticoagulants: Secondary | ICD-10-CM | POA: Diagnosis not present

## 2018-10-11 DIAGNOSIS — J45909 Unspecified asthma, uncomplicated: Secondary | ICD-10-CM | POA: Diagnosis present

## 2018-10-11 DIAGNOSIS — Z6841 Body Mass Index (BMI) 40.0 and over, adult: Secondary | ICD-10-CM

## 2018-10-11 LAB — IRON AND TIBC
Iron: 14 ug/dL — ABNORMAL LOW (ref 28–170)
Saturation Ratios: 3 % — ABNORMAL LOW (ref 10.4–31.8)
TIBC: 409 ug/dL (ref 250–450)
UIBC: 395 ug/dL

## 2018-10-11 LAB — CBC WITH DIFFERENTIAL/PLATELET
Abs Immature Granulocytes: 0.01 10*3/uL (ref 0.00–0.07)
Basophils Absolute: 0 10*3/uL (ref 0.0–0.1)
Basophils Relative: 0 %
Eosinophils Absolute: 0.1 10*3/uL (ref 0.0–0.5)
Eosinophils Relative: 2 %
HCT: 22.3 % — ABNORMAL LOW (ref 36.0–46.0)
Hemoglobin: 6.5 g/dL — ABNORMAL LOW (ref 12.0–15.0)
Immature Granulocytes: 0 %
Lymphocytes Relative: 44 %
Lymphs Abs: 2.2 10*3/uL (ref 0.7–4.0)
MCH: 24.3 pg — ABNORMAL LOW (ref 26.0–34.0)
MCHC: 29.1 g/dL — ABNORMAL LOW (ref 30.0–36.0)
MCV: 83.5 fL (ref 80.0–100.0)
Monocytes Absolute: 0.5 10*3/uL (ref 0.1–1.0)
Monocytes Relative: 9 %
Neutro Abs: 2.3 10*3/uL (ref 1.7–7.7)
Neutrophils Relative %: 45 %
Platelets: 304 10*3/uL (ref 150–400)
RBC: 2.67 MIL/uL — ABNORMAL LOW (ref 3.87–5.11)
RDW: 15.4 % (ref 11.5–15.5)
WBC: 5.1 10*3/uL (ref 4.0–10.5)
nRBC: 0.6 % — ABNORMAL HIGH (ref 0.0–0.2)

## 2018-10-11 LAB — COMPREHENSIVE METABOLIC PANEL
ALT: 8 U/L (ref 0–44)
AST: 15 U/L (ref 15–41)
Albumin: 3.8 g/dL (ref 3.5–5.0)
Alkaline Phosphatase: 65 U/L (ref 38–126)
Anion gap: 6 (ref 5–15)
BUN: 14 mg/dL (ref 6–20)
CO2: 26 mmol/L (ref 22–32)
Calcium: 9.3 mg/dL (ref 8.9–10.3)
Chloride: 108 mmol/L (ref 98–111)
Creatinine, Ser: 1.07 mg/dL — ABNORMAL HIGH (ref 0.44–1.00)
GFR calc Af Amer: >60 mL/min (ref >60)
GFR calc non Af Amer: >60 mL/min (ref >60)
Glucose, Bld: 94 mg/dL (ref 70–99)
Potassium: 4 mmol/L (ref 3.5–5.1)
Sodium: 140 mmol/L (ref 135–145)
Total Bilirubin: 0.3 mg/dL (ref 0.3–1.2)
Total Protein: 7.1 g/dL (ref 6.5–8.1)

## 2018-10-11 LAB — FERRITIN: Ferritin: 4 ng/mL — ABNORMAL LOW (ref 11–307)

## 2018-10-11 LAB — FOLATE: Folate: 8.1 ng/mL (ref >5.9)

## 2018-10-11 LAB — TSH: TSH: 0.515 u[IU]/mL (ref 0.350–4.500)

## 2018-10-11 LAB — PREPARE RBC (CROSSMATCH)

## 2018-10-11 LAB — VITAMIN B12: Vitamin B-12: 242 pg/mL (ref 180–914)

## 2018-10-11 LAB — LACTATE DEHYDROGENASE: LDH: 173 U/L (ref 98–192)

## 2018-10-11 LAB — RETICULOCYTES
Immature Retic Fract: 6.7 % (ref 2.3–15.9)
RBC.: 2.64 MIL/uL — ABNORMAL LOW (ref 3.87–5.11)
Retic Count, Absolute: 73.1 10*3/uL (ref 19.0–186.0)
Retic Ct Pct: 2.8 % (ref 0.4–3.1)

## 2018-10-11 LAB — ABO/RH: ABO/RH(D): O POS

## 2018-10-11 MED ORDER — FLUTICASONE PROPIONATE 50 MCG/ACT NA SUSP
2.0000 | Freq: Every day | NASAL | Status: DC | PRN
  Filled 2018-10-11: qty 16

## 2018-10-11 MED ORDER — SENNOSIDES-DOCUSATE SODIUM 8.6-50 MG PO TABS: 1.0000 | ORAL_TABLET | Freq: Every evening | ORAL | Status: DC | PRN

## 2018-10-11 MED ORDER — ALBUTEROL SULFATE (2.5 MG/3ML) 0.083% IN NEBU: 2.5000 mg | INHALATION_SOLUTION | RESPIRATORY_TRACT | Status: DC | PRN

## 2018-10-11 MED ORDER — ACETAMINOPHEN 650 MG RE SUPP: 650.0000 mg | Freq: Four times a day (QID) | RECTAL | Status: DC | PRN

## 2018-10-11 MED ORDER — TIZANIDINE HCL 4 MG PO TABS
4.0000 mg | ORAL_TABLET | Freq: Three times a day (TID) | ORAL | Status: DC
  Administered 2018-10-11 – 2018-10-12 (×3): 4 mg via ORAL
  Filled 2018-10-11 (×7): qty 1

## 2018-10-11 MED ORDER — ORLISTAT 120 MG PO CAPS: 120.0000 mg | ORAL_CAPSULE | Freq: Three times a day (TID) | ORAL | Status: DC

## 2018-10-11 MED ORDER — CALCIUM CARBONATE ANTACID 500 MG PO CHEW
1.0000 | CHEWABLE_TABLET | Freq: Two times a day (BID) | ORAL | Status: DC
  Administered 2018-10-11 – 2018-10-13 (×4): 200 mg via ORAL
  Filled 2018-10-11 (×4): qty 1

## 2018-10-11 MED ORDER — BISACODYL 5 MG PO TBEC: 5.0000 mg | DELAYED_RELEASE_TABLET | Freq: Every day | ORAL | Status: DC | PRN

## 2018-10-11 MED ORDER — LINACLOTIDE 290 MCG PO CAPS
290.0000 ug | ORAL_CAPSULE | Freq: Every day | ORAL | Status: DC
  Administered 2018-10-12 (×2): 290 ug via ORAL
  Filled 2018-10-11 (×2): qty 1

## 2018-10-11 MED ORDER — ONDANSETRON HCL 4 MG/2ML IJ SOLN: 4.0000 mg | Freq: Four times a day (QID) | INTRAMUSCULAR | Status: DC | PRN

## 2018-10-11 MED ORDER — PREGABALIN 50 MG PO CAPS
50.0000 mg | ORAL_CAPSULE | Freq: Two times a day (BID) | ORAL | Status: DC
  Administered 2018-10-11 – 2018-10-12 (×2): 50 mg via ORAL
  Filled 2018-10-11 (×4): qty 1

## 2018-10-11 MED ORDER — TOPIRAMATE ER 100 MG PO SPRINKLE CAP24
200.0000 mg | EXTENDED_RELEASE_CAPSULE | Freq: Every day | ORAL | Status: DC
  Filled 2018-10-11: qty 2

## 2018-10-11 MED ORDER — DILTIAZEM HCL ER COATED BEADS 180 MG PO CP24
360.0000 mg | ORAL_CAPSULE | Freq: Every day | ORAL | Status: DC
  Administered 2018-10-12: 10:00:00 360 mg via ORAL
  Filled 2018-10-11 (×2): qty 2

## 2018-10-11 MED ORDER — SODIUM CHLORIDE 0.9 % IV SOLN
10.0000 mL/h | Freq: Once | INTRAVENOUS | Status: AC
  Administered 2018-10-11: 10 mL/h via INTRAVENOUS

## 2018-10-11 MED ORDER — MOMETASONE FURO-FORMOTEROL FUM 200-5 MCG/ACT IN AERO
2.0000 | INHALATION_SPRAY | Freq: Two times a day (BID) | RESPIRATORY_TRACT | Status: DC
  Administered 2018-10-11 – 2018-10-13 (×4): 2 via RESPIRATORY_TRACT
  Filled 2018-10-11: qty 8.8

## 2018-10-11 MED ORDER — FOLIC ACID 1 MG PO TABS
1.0000 mg | ORAL_TABLET | Freq: Every day | ORAL | Status: DC
  Administered 2018-10-12: 10:00:00 1 mg via ORAL
  Filled 2018-10-11 (×2): qty 1

## 2018-10-11 MED ORDER — POTASSIUM CHLORIDE CRYS ER 20 MEQ PO TBCR
20.0000 meq | EXTENDED_RELEASE_TABLET | Freq: Two times a day (BID) | ORAL | Status: DC
  Administered 2018-10-12 – 2018-10-13 (×3): 20 meq via ORAL
  Filled 2018-10-11 (×3): qty 1

## 2018-10-11 MED ORDER — RISPERIDONE 3 MG PO TABS
3.0000 mg | ORAL_TABLET | Freq: Every day | ORAL | Status: DC
  Administered 2018-10-11 – 2018-10-12 (×2): 3 mg via ORAL
  Filled 2018-10-11 (×3): qty 1

## 2018-10-11 MED ORDER — ROSUVASTATIN CALCIUM 20 MG PO TABS
20.0000 mg | ORAL_TABLET | Freq: Every day | ORAL | Status: DC
  Administered 2018-10-12 – 2018-10-13 (×2): 20 mg via ORAL
  Filled 2018-10-11 (×2): qty 1

## 2018-10-11 MED ORDER — CLONAZEPAM 0.5 MG PO TABS
2.0000 mg | ORAL_TABLET | Freq: Three times a day (TID) | ORAL | Status: DC
  Administered 2018-10-11 – 2018-10-13 (×4): 2 mg via ORAL
  Filled 2018-10-11 (×4): qty 4

## 2018-10-11 MED ORDER — ESCITALOPRAM OXALATE 10 MG PO TABS
20.0000 mg | ORAL_TABLET | Freq: Every day | ORAL | Status: DC
  Administered 2018-10-12: 20 mg via ORAL
  Filled 2018-10-11 (×2): qty 2

## 2018-10-11 MED ORDER — HYDROCODONE-ACETAMINOPHEN 5-325 MG PO TABS
1.0000 | ORAL_TABLET | ORAL | Status: DC | PRN
  Administered 2018-10-11: 21:00:00 1 via ORAL
  Filled 2018-10-11: qty 1

## 2018-10-11 MED ORDER — PRENATAL PLUS 27-1 MG PO TABS
1.0000 | ORAL_TABLET | Freq: Every day | ORAL | Status: DC
  Administered 2018-10-12 – 2018-10-13 (×2): 1 via ORAL
  Filled 2018-10-11 (×3): qty 1

## 2018-10-11 MED ORDER — PROPRANOLOL HCL 40 MG PO TABS
80.0000 mg | ORAL_TABLET | Freq: Three times a day (TID) | ORAL | Status: DC
  Administered 2018-10-11 – 2018-10-12 (×2): 80 mg via ORAL
  Filled 2018-10-11 (×7): qty 2

## 2018-10-11 MED ORDER — ONDANSETRON HCL 4 MG PO TABS: 4.0000 mg | ORAL_TABLET | Freq: Four times a day (QID) | ORAL | Status: DC | PRN

## 2018-10-11 MED ORDER — LACTULOSE 10 GM/15ML PO SOLN: 30.0000 g | Freq: Every day | ORAL | Status: DC | PRN

## 2018-10-11 MED ORDER — PANTOPRAZOLE SODIUM 40 MG PO TBEC
40.0000 mg | DELAYED_RELEASE_TABLET | Freq: Every day | ORAL | Status: DC
  Administered 2018-10-12: 40 mg via ORAL
  Filled 2018-10-11 (×2): qty 1

## 2018-10-11 MED ORDER — FUROSEMIDE 40 MG PO TABS
40.0000 mg | ORAL_TABLET | Freq: Two times a day (BID) | ORAL | Status: DC
  Administered 2018-10-12 (×2): 40 mg via ORAL
  Filled 2018-10-11 (×3): qty 1

## 2018-10-11 MED ORDER — LAMOTRIGINE 100 MG PO TABS
200.0000 mg | ORAL_TABLET | Freq: Two times a day (BID) | ORAL | Status: DC
  Administered 2018-10-11 – 2018-10-12 (×3): 200 mg via ORAL
  Filled 2018-10-11 (×4): qty 2

## 2018-10-11 MED ORDER — ACETAMINOPHEN 325 MG PO TABS: 650.0000 mg | ORAL_TABLET | Freq: Four times a day (QID) | ORAL | Status: DC | PRN

## 2018-10-11 NOTE — Progress Notes (Signed)
[facility] day:  10/11/2018  Chief Complaint: Andrea Miller is a 32 y.o. female with pulmonary embolism who is referred in consultation by Dr. Clide Deutscher for assessment and management  HPI:  Patient presented to the ED on 09/15/2018 following a fall from her porch. Patient became short of breath and dizzy just prior to the fall.   The patient was admitted to [facility] from 09/15/2018 - 09/16/2018 with left sided chest discomfort and shortness of breath.  Chest CT angiogram on 09/15/2018 revealed small BILATERAL pulmonary emboli and mild cardiomegaly.  Bilateral lower extremity duplex on 09/16/2018 revealed no evidence of lower extremity DVT.  Echo on 09/16/2018 revealed an EF of 55-60% with borderline LVH a mildy dilated RIGHT ventricle size. Patient anticoagulated with heparin and discharged home on rivaroxaban.   CBC on 09/16/2018 revealed a hematocrit of 40.1, hemoglobin 11.8, MCV 87.9, platelets 222,000, and a WBC 4900.  Creatinine was 1.1.  Prior to her admission for VTE, patient denies extended car trip or other prolonged immobility. She had no prior history of VTE or hematologic disorders. Preceding her clot, patient had been on oral BCPs x 8 months. Patient has never been pregnant.   She notes that she makes efforts to remain active.  She "walks around the mall" and goes to her medical appointments, which keeps her mobile. Patient previously participating in physical therapy, however following her clot, therapy services were discontinued.  She is actively trying to lose weight. She has lost 30 pound over the last 2-3 months on Weight [employer].  She drinks protein shakes.  Patient had a goal of having bariatric surgery this month, however due to her clot, surgery has been postponed x 6 months. Patient advises that she maintains an adequate appetite. She is eating well. Weight today is (!) 369 lb (167.4 kg).  Patient has been experiencing non-specific  headaches and blurred vision. She is under the care of neurology. Proposed treatment plan included botulinum toxin injections, however patient refused. Patient has had cardiac issues since a child. She sees cardiology regularly. Patient with chronic shortness of breath, which worsens with exertion. Patient states, "my tachycardia has always been a problem". Patient has "good days and bad days" with regards to her tachycardia. Patient has been experiencing vertigo symptoms, mainly associated with rapid position changes.   Patient has difficulties with urinary frequency and urgency. She is unable to "hold her bladder" since she has a bulging disc in her back in 2015. Patient has areas of skin thickening and hyperpigmentation to her anterior chest wall and back. She denies any recent fevers or excessive diaphoresis. Patient denies bleeding; no hematochezia, melena, or gross hematuria. Menstrual cycles are described as being "very heavy".   Patient's paternal great aunt has an unknown cancer.   She has no family history of thrombosis.  Patient complains of pain rated 9/10 (generalized) in the clinic today.    Past Medical History:  Diagnosis Date  . Anxiety   . Asthma   . Bipolar disorder ([place])   . Chest pain    and angina  . CHF (congestive heart failure) ([place])   . Depression   . Fibromyalgia   . Hypertension   . 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 Obesity   . Pre-diabetes   . Seizures ([place])    secondary to anxiety  last seizure 01/25/18  . SVT (supraventricular tachycardia) ([facility])    with hx of  syncope; managed by [facility]    Past Surgical History:  Procedure Laterality Date  . CHOLECYSTECTOMY N/A 02/22/2018   Procedure: LAPAROSCOPIC CHOLECYSTECTOMY;  Surgeon: Vickie Epley, MD;  Location: [facility] ORS;  Service: General;  Laterality: N/A;  . TONSILLECTOMY AND ADENOIDECTOMY N/A 09/05/2016   Procedure: TONSILLECTOMY AND ADENOIDECTOMY;  Surgeon: Beverly Gust, MD;  Location: [facility] ORS;  Service: ENT;  Laterality: N/A;    Family History  Problem Relation Age of Onset  . Diabetes Mother   . Hypertension Mother   . Asthma Mother   . Gallstones Mother   . Diabetes Father   . Hypertension Father   . Gallstones Father   . Bipolar disorder Sister   . COPD Brother   . Schizophrenia Brother   . COPD Brother   . Hypertension Brother     Social History:  reports that she has never smoked. She has never used smokeless tobacco. She reports that she does not drink alcohol or use drugs.  No tobacco, tobacco, or illegal subatance use. Prior to her back issues, patient was employed full time at [employer]. The patient is alone today.  Allergies:  Allergies  Allergen Reactions  . Cortizone-10 [Hydrocortisone] Shortness Of Breath and Swelling  . Garlic Anaphylaxis  . Prednisone Swelling    Tongue swelling  . Corticosteroids Palpitations    Current Medications: Current Outpatient Medications  Medication Sig Dispense Refill  . albuterol (PROVENTIL HFA;VENTOLIN HFA) 108 (90 Base) MCG/ACT inhaler Inhale 2 puffs into the lungs every 6 (six) hours as needed for wheezing or shortness of breath. 1 Inhaler 10  . albuterol (PROVENTIL) (2.5 MG/3ML) 0.083% nebulizer solution Take 2.5 mg by nebulization every 4 (four) hours as needed for wheezing or shortness of breath.    . budesonide-formoterol (SYMBICORT) 160-4.5 MCG/ACT inhaler Inhale 2 puffs into the lungs 2 (two) times daily. (Patient taking differently: Inhale 1 puff into the lungs 2 (two) times daily. ) 1 Inhaler 10  . Calcium-Magnesium 500-250 MG TABS Take 1 tablet by mouth 2 (two) times daily.     . clonazePAM (KLONOPIN) 2 MG tablet Take 2 mg by mouth 3 (three) times daily.     Marland Kitchen DEXILANT 30 MG capsule Take 1 capsule by mouth daily.  5  . diltiazem (CARDIZEM CD) 360 MG 24 hr capsule Take 360 mg by mouth daily.     Marland Kitchen escitalopram (LEXAPRO) 20 MG tablet Take 20 mg by mouth daily.    .  fluticasone (FLONASE) 50 MCG/ACT nasal spray Place 2 sprays into both nostrils daily as needed for allergies.     . folic acid (FOLVITE) 1 MG tablet Take 1 mg by mouth daily.   10  . furosemide (LASIX) 20 MG tablet Take 40 mg by mouth 2 (two) times daily.     Marland Kitchen ketoconazole (NIZORAL) 2 % shampoo   1  . lamoTRIgine (LAMICTAL) 100 MG tablet Take 200 mg by mouth 2 (two) times daily.     Marland Kitchen linaclotide (LINZESS) 290 MCG CAPS capsule Take 290 mcg by mouth daily before breakfast.    . mupirocin ointment (BACTROBAN) 2 %   0  . orlistat (XENICAL) 120 MG capsule Take 120 mg by mouth 3 (three) times daily with meals.    . potassium chloride (K-DUR) 10 MEQ tablet Take 20 mEq by mouth 2 (two) times daily.     . pregabalin (LYRICA) 50 MG capsule Take 1 capsule (50 mg total) by mouth 2 (two) times daily. 60 capsule 0  .  Prenatal Vit-Fe Fumarate-FA (MULTIVITAMIN-PRENATAL) 27-0.8 MG TABS tablet Take 1 tablet by mouth daily at 12 noon.    . propranolol (INDERAL) 40 MG tablet Take 80 mg by mouth 3 (three) times daily.     . risperiDONE (RISPERDAL) 3 MG tablet Take 3 mg by mouth daily.     . Rivaroxaban (XARELTO STARTER PACK) 15 & 20 MG TBPK Xarelto 15 mg (42)-20 mg (9) tablets in a starter pack    . Rivaroxaban 15 & 20 MG TBPK Take as directed on package: Start with one 24m tablet by mouth twice a day with food. On Day 22, switch to one 219mtablet once a day with food. 51 each 0  . rosuvastatin (CRESTOR) 20 MG tablet Take 20 mg by mouth daily.    . Marland KitcheniZANidine (ZANAFLEX) 4 MG tablet Take 4 mg by mouth 3 (three) times daily.   1  . Topiramate ER (TROKENDI XR) 200 MG CP24 Take 1 capsule by mouth daily.     . Marland Kitchenetorolac (TORADOL) 10 MG tablet Take 10 mg by mouth.   0  . lactulose (CHRONULAC) 10 GM/15ML solution Take 30 g by mouth daily as needed for mild constipation.   5  . promethazine (PHENERGAN) 12.5 MG tablet Take 2 tablets (25 mg total) by mouth every 6 (six) hours as needed for nausea or vomiting. (Patient not  taking: Reported on 10/11/2018) 20 tablet 0   No current facility-administered medications for this visit.     Review of Systems:  GENERAL:  Patient describes "good days and bad days".  No fevers, sweats. Intentional weight loss of 30 pounds. PERFORMANCE STATUS (ECOG):  1-2 HEENT:  Sore throat.  Blurred vision.  No runny nose, mouth sores or tenderness. Lungs: No shortness of breath.  Dry cough.  No hemoptysis. Cardiac:  Chronic hear issues (tachycardia, palpitations).  No orthopnea, or PND. GI:  No nausea, vomiting, diarrhea, constipation, melena or hematochezia. GU:  Chronic urgency and frequency.  Difficulty "holding urine".  No dysuria, or hematuria. Musculoskeletal:  Bulging disk.  No joint pain.  No muscle tenderness. Extremities:  No pain or swelling. Skin:  Thickened skin (back and chest), sees dermatology. Neuro:  Headaches, sees neurology.  Vertigo with rapid change in position.  No numbness or weakness, balance or coordination issues. Endocrine:  No diabetes, thyroid issues, hot flashes or night sweats. Psych:  No mood changes, depression or anxiety. Pain:  Various pain (?fibromyalgia). Review of systems:  All other systems reviewed and found to be negative.  Physical Exam: Blood pressure (!) 135/92, pulse 97, temperature (!) 97.2 F (36.2 C), temperature source Tympanic, resp. rate 20, height 5' 9.69" (1.77 m), weight (!) 369 lb (167.4 kg), last menstrual period 09/16/2018. GENERAL:  Well developed, well nourished, heavyset woman sitting comfortably in the exam room in no acute distress. MENTAL STATUS:  Alert and oriented to person, place and time. HEAD:  Dark hair.  Normocephalic, atraumatic, face symmetric, no Cushingoid features. EYES:  Glasses.  Brown eyes.  Pupils equal round and reactive to light and accomodation.  No conjunctivitis or scleral icterus. ENT:  Oropharynx clear without lesion.  Tongue normal. Mucous membranes moist.  RESPIRATORY:  Clear to auscultation  without rales, wheezes or rhonchi. CARDIOVASCULAR:  Regular rate and rhythm without murmur, rub or gallop. ABDOMEN:  Fully round.  Soft, non-tender, with active bowel sounds, and no appreciable hepatosplenomegaly.  No masses. SKIN:  Area of slightly dark and thickened skin between breast (pointed out by patient).  Several  areas across body tender on palpation. EXTREMITIES: No edema, no skin discoloration or tenderness.  No palpable cords. LYMPH NODES: No palpable cervical, supraclavicular, axillary or inguinal adenopathy  NEUROLOGICAL: Unremarkable. PSYCH:  Appropriate.   No visits with results within 3 Day(s) from this visit.  Latest known visit with results is:  Admission on 09/24/2018, Discharged on 09/24/2018  Component Date Value Ref Range Status  . Sodium 09/24/2018 144  135 - 145 mmol/L Final  . Potassium 09/24/2018 3.3* 3.5 - 5.1 mmol/L Final  . Chloride 09/24/2018 106  98 - 111 mmol/L Final  . CO2 09/24/2018 30  22 - 32 mmol/L Final  . Glucose, Bld 09/24/2018 111* 70 - 99 mg/dL Final  . BUN 09/24/2018 7  6 - 20 mg/dL Final  . Creatinine, Ser 09/24/2018 0.87  0.44 - 1.00 mg/dL Final  . Calcium 09/24/2018 9.2  8.9 - 10.3 mg/dL Final  . GFR calc non Af Amer 09/24/2018 >60  >60 mL/min Final  . GFR calc Af Amer 09/24/2018 >60  >60 mL/min Final   Comment: (NOTE) The eGFR has been calculated using the CKD EPI equation. This calculation has not been validated in all clinical situations. eGFR's persistently <60 mL/min signify possible Chronic Kidney Disease.   Georgiann Hahn gap 09/24/2018 8  5 - 15 Final   Performed at [facility], [address] [place], [place] [address]  . WBC 09/24/2018 4.6  4.0 - 10.5 K/uL Final  . RBC 09/24/2018 3.87  3.87 - 5.11 MIL/uL Final  . Hemoglobin 09/24/2018 10.5* 12.0 - 15.0 g/dL Final  . HCT 09/24/2018 33.5* 36.0 - 46.0 % Final  . MCV 09/24/2018 86.6  80.0 - 100.0 fL Final  . MCH 09/24/2018 27.1  26.0 - 34.0 pg Final  . MCHC 09/24/2018  31.3  30.0 - 36.0 g/dL Final  . RDW 09/24/2018 14.4  11.5 - 15.5 % Final  . Platelets 09/24/2018 228  150 - 400 K/uL Final  . nRBC 09/24/2018 0.0  0.0 - 0.2 % Final   Performed at [facility], [address], [place], [place] [address]  . Troponin I 09/24/2018 <0.03  <0.03 ng/mL Final   Performed at [facility], [address] [place], [place] [address]    Assessment:  Andrea Miller is a 32 y.o. female with small bilateral pulmonary emboli.  Risk factors for thrombosis include obesity and birth control pills.  Chest CT angiogram on 09/15/2018 revealed small bilateral pulmonary emboli and mild cardiomegaly.  Bilateral lower extremity duplex on 09/16/2018 revealed no evidence of lower extremity DVT.    She is scheduled for gastric bypass on 10/18/2018.  Symptomatically, she has "good and bad days".  She has chronic issues with tachycardia.  She notes vertigo with rapid change in position.  Exam is unremarkable.  Plan: 1.  Labs today:  CBC with diff, CMP, Factor V Leiden , prothrombin gene mutation, lupus anticoagulant panel, anti-cardiolipin antibodies, beta-2 glycoprotein antibodies. 2.  After labs returned documenting anemia:  B12, folate, ferritin, iron studies, TSH, retic, DAT, LDH. 3.  Pulmonary emboli:  Etiology felt secondary to weight and birth control pills  Discuss risk factors for thromboembolism and analogy of basket floating and risk factors for thrombosis being rocks in the basket.   Discuss initial hypercoagulable work-up.  Discuss additional testing in 3 months (portein C, protein S, and ATIII).  Continue Xarelto for a minimum of 6 months unless work-up reveals an issue requiring longer anticoagulation. 4.  Gastric bypass  surgery:  Discuss risk of DVT and PE is greatest in the first 3 months after her recent pulmonary embolism.  Discuss importance of delaying surgery for at least 3 months (planned for 6 months).  Risk of recurrence is highest in  first 3-4 weeks and decreases with time.  Without anticoagulation, risk of recurrent embolism is approximately 50% and decreases to 5% after 3 months.  Discuss perioperative management of Xarelto:  Hold Xarelto for 2 days prior to surgery and reinstitute Xarelto post-operatively after cleared by surgery. 5.  RTC in 3 months for MD assessment and labs (CBC with diff, CMP, ATIII, protein C activity and total, protein S activity and total)   Addendum:  Hemoglobin unexpectedly was 6.5.  Hemoglobin was 13.2 on 10/20, 11.8 on 10/21, and 10.5 on 09/24/2018.  Patient was contacted and directed to the emergency room for evaluation and likely admission.   Honor Loh, NP  10/11/2018, 12:16 PM   I saw and evaluated the patient, participating in the key portions of the service and reviewing pertinent diagnostic studies and records.  I reviewed the nurse practitioner's note and agree with the findings and the plan.  The assessment and plan were discussed with the patient.  Multiple questions were asked by the patient and answered.   Nolon Stalls, MD 10/11/2018,12:16 PM

## 2018-10-11 NOTE — ED Triage Notes (Signed)
Pt to ED from home states was seen at [facility] this [facility] with blood work and was told to come to ED for low hemoglobin.  Pt states generalized weakness, tired, and exertional SOB.  Pt is A&Ox4, speaking in complete and coherent sentences, chest rise even and unlabored, skin warm and dry and appropriate color at this time.

## 2018-10-11 NOTE — ED Provider Notes (Signed)
[facility] Emergency Department Provider Note    First MD Initiated Contact with Patient 10/11/18 1536     (approximate)  I have reviewed the triage vital signs and the nursing notes.   HISTORY  Chief Complaint Anemia    HPI Andrea Miller is a 32 y.o. 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  Patient was sent over from hematology oncology clinic due to significant anemia and syncope and drop in her hemoglobin over the past few weeks.  States she does have very heavy menstrual cycles.  Denies any hematuria.  No spontaneous bleeding.  Denies any  melena or hematochezia.   Past Medical History:  Diagnosis Date  . Anxiety   . Asthma   . Bipolar disorder (HCC)   . Chest pain    and angina  . CHF (congestive heart failure) (HCC)   . Depression   . Fibromyalgia   . Hypertension   . 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 Obesity   . Pre-diabetes   . Seizures (HCC)    secondary to anxiety  last seizure 01/25/18  . SVT (supraventricular tachycardia) (HCC)    with hx of syncope; managed by [facility]   Family History  Problem Relation Age of Onset  . Diabetes Mother   . Hypertension Mother   . Asthma Mother   . Gallstones Mother   . Diabetes Father   . Hypertension Father   . Gallstones Father   . Bipolar disorder Sister   . COPD Brother   . Schizophrenia Brother   . COPD Brother   . Hypertension Brother    Past Surgical History:  Procedure Laterality Date  . CHOLECYSTECTOMY N/A 02/22/2018   Procedure: LAPAROSCOPIC CHOLECYSTECTOMY;  Surgeon: Ancil Linsey, MD;  Location: [facility] ORS;  Service: General;  Laterality: N/A;  . TONSILLECTOMY AND ADENOIDECTOMY N/A 09/05/2016   Procedure: TONSILLECTOMY AND ADENOIDECTOMY;  Surgeon: Linus Salmons, MD;  Location: [facility] ORS;  Service: ENT;  Laterality: N/A;   Patient Active  Problem List   Diagnosis Date Noted  . Multiple subsegmental pulmonary emboli without acute cor pulmonale 09/15/2018  . Morbid obesity with BMI of 50.0-59.9, adult (HCC) 09/15/2018  . Chronic diastolic heart failure (HCC) 07/01/2018  . Lymphedema 07/01/2018  . Chronic cholecystitis   . Edema 02/13/2018  . Diabetes mellitus type 2, uncomplicated (HCC) 02/13/2018  . Hypertension 11/23/2017  . Asthma exacerbation 04/14/2017  . Asthma without status asthmaticus 01/16/2017  . Bipolar affective (HCC) 01/16/2017  . Coronary artery disease 01/16/2017  . Syncope 12/12/2016  . Fibromyalgia 04/27/2014  . OSA (obstructive sleep apnea) 04/27/2014  . Seizure-like activity (HCC) 04/27/2014  . Migraine without status migrainosus, not intractable 04/27/2014  . Obesity 04/15/2014  . Sleep disorder 04/15/2014  . Neck pain 04/15/2014  . Headache 04/15/2014  . Restless legs syndrome 04/15/2014  . Supraventricular tachycardia (HCC) 07/13/2009      Prior to Admission medications   Medication Sig Start Date End Date Taking? Authorizing Provider  albuterol (PROVENTIL HFA;VENTOLIN HFA) 108 (90 Base) MCG/ACT inhaler Inhale 2 puffs into the lungs every 6 (six) hours as needed for wheezing or shortness of breath. 07/31/18   Merwyn Katos, MD  albuterol (PROVENTIL) (2.5 MG/3ML) 0.083% nebulizer solution Take 2.5 mg by nebulization every 4 (four) hours as needed for wheezing or shortness of breath.    [provider]  budesonide-formoterol (SYMBICORT) 160-4.5 MCG/ACT inhaler Inhale 2 puffs into the lungs 2 (two) times daily. Patient taking differently: Inhale 1 puff into the lungs 2 (two) times daily.  01/31/18   Merwyn Katos, MD  Calcium-Magnesium 500-250 MG TABS Take 1 tablet by mouth 2 (two) times daily.     [provider]  clonazePAM (KLONOPIN) 2 MG tablet Take 2 mg by mouth 3 (three) times daily.     [provider]  DEXILANT 30 MG capsule Take 1 capsule by mouth daily.  08/28/18   [provider]  diltiazem (CARDIZEM CD) 360 MG 24 hr capsule Take 360 mg by mouth daily.     [provider]  escitalopram (LEXAPRO) 20 MG tablet Take 20 mg by mouth daily.    [provider]  fluticasone (FLONASE) 50 MCG/ACT nasal spray Place 2 sprays into both nostrils daily as needed for allergies.     [provider]  folic acid (FOLVITE) 1 MG tablet Take 1 mg by mouth daily.  12/15/16   [provider]  furosemide (LASIX) 20 MG tablet Take 40 mg by mouth 2 (two) times daily.     [provider]  ketoconazole (NIZORAL) 2 % shampoo  10/01/18   [provider]  ketorolac (TORADOL) 10 MG tablet Take 10 mg by mouth.  10/01/18   [provider]  lactulose (CHRONULAC) 10 GM/15ML solution Take 30 g by mouth daily as needed for mild constipation.  08/28/18   [provider]  lamoTRIgine (LAMICTAL) 100 MG tablet Take 200 mg by mouth 2 (two) times daily.     [provider]  linaclotide Karlene Einstein) 290 MCG CAPS capsule Take 290 mcg by mouth daily before breakfast.    [provider]  mupirocin ointment (BACTROBAN) 2 %  10/01/18   [provider]  orlistat (XENICAL) 120 MG capsule Take 120 mg by mouth 3 (three) times daily with meals.    [provider]  potassium chloride (K-DUR) 10 MEQ tablet Take 20 mEq by mouth 2 (two) times daily.     [provider]  pregabalin (LYRICA) 50 MG capsule Take 1 capsule (50 mg total) by mouth 2 (two) times daily. 09/16/18   Erick Blinks, MD  Prenatal Vit-Fe Fumarate-FA (MULTIVITAMIN-PRENATAL) 27-0.8 MG TABS tablet Take 1 tablet by mouth daily at 12 noon.    [provider]  promethazine (PHENERGAN) 12.5 MG tablet Take 2 tablets (25 mg total) by mouth every 6 (six) hours as needed for nausea or vomiting. Patient not taking: Reported on 10/11/2018 02/20/18   Governor Rooks, MD  propranolol (INDERAL) 40 MG tablet Take 80 mg by mouth 3  (three) times daily.     [provider]  risperiDONE (RISPERDAL) 3 MG tablet Take 3 mg by mouth daily.     [provider]  Rivaroxaban (XARELTO STARTER PACK) 15 & 20 MG TBPK Xarelto 15 mg (42)-20 mg (9) tablets in a starter pack    [provider]  Rivaroxaban 15 & 20 MG TBPK Take as directed on package: Start with one 15mg  tablet by mouth twice a day with food. On Day 22, switch to one 20mg  tablet once a day with food. 09/16/18   Erick Blinks, MD  rosuvastatin (CRESTOR) 20 MG tablet Take 20 mg by mouth daily.    [provider]  tiZANidine (ZANAFLEX) 4 MG tablet Take 4 mg by mouth 3 (three) times daily.  03/26/17   [provider]  Topiramate ER (TROKENDI XR) 200 MG CP24 Take 1 capsule by mouth daily.     [provider]    Allergies Cortizone-10 [hydrocortisone]; Garlic; Prednisone; and Corticosteroids    Social History Social History   Tobacco Use  . Smoking status: Never Smoker  . Smokeless tobacco: Never Used  Substance Use Topics  . Alcohol use: No  . Drug use: No    Review of Systems Patient denies headaches, rhinorrhea, blurry vision, numbness, shortness of breath, chest pain, edema, cough, abdominal pain, nausea, vomiting, diarrhea, dysuria, fevers, rashes or hallucinations unless otherwise stated above in HPI. ____________________________________________   PHYSICAL EXAM:  VITAL SIGNS: Vitals:   10/11/18 1522  BP: 131/82  Pulse: 89  Resp: 18  Temp: 98.2 F (36.8 C)  SpO2: 100%    Constitutional: Alert and oriented.  Eyes: Conjunctivae are normal.  Head: Atraumatic. Nose: No congestion/rhinnorhea. Mouth/Throat: Mucous membranes are moist.   Neck: No stridor. Painless ROM.  Cardiovascular: Normal rate, regular rhythm. Grossly normal heart sounds.  Good peripheral circulation. Respiratory: Normal respiratory effort.  No retractions. Lungs CTAB. Gastrointestinal: Soft and nontender. No distention. No  abdominal bruits. No CVA tenderness. Genitourinary: Normal external genitalia, no hemorrhoids, no hematochezia or melena, guaiac negative stool. Musculoskeletal: No lower extremity tenderness nor edema.  No joint effusions. Neurologic:  Normal speech and language. No gross focal neurologic deficits are appreciated. No facial droop Skin:  Skin is warm, dry and intact. No rash noted. Psychiatric:  Speech and behavior are normal.  ____________________________________________   LABS (all labs ordered are listed, but only abnormal results are displayed)  Results for orders placed or performed in visit on 10/11/18 (from the past 24 hour(s))  Lactate dehydrogenase     Status: None   Collection Time: 10/11/18 12:52 PM  Result Value Ref Range   LDH 173 98 - 192 U/L  Reticulocytes     Status: Abnormal   Collection Time: 10/11/18 12:52 PM  Result Value Ref Range   Retic Ct Pct 2.8 0.4 - 3.1 %   RBC. 2.64 (L) 3.87 - 5.11 MIL/uL   Retic Count, Absolute 73.1 19.0 - 186.0 K/uL   Immature Retic Fract 6.7 2.3 - 15.9 %   ____________________________________________  ____________________________________________  RADIOLOGY   ____________________________________________   PROCEDURES  Procedure(s) performed:  Procedures    Critical Care performed: no ____________________________________________   INITIAL IMPRESSION / ASSESSMENT AND PLAN / ED COURSE  Pertinent labs & imaging results that were available during my care of the patient were reviewed by me and considered in my medical decision making (see chart for details).   DDX: symptomatic anemia  Andrea Miller is a 32 y.o. who presents to the ED with symptomatic anemia.  Patient is afebrile Heema dynamically stable.  Guaiac negative stool.  Do suspect large amount of blood loss secondary to probable iron deficiency anemia as well as heavy menses that she is on Xarelto and reports those.  No active bleeding at this time.  Will  transfuse with 2 units of blood.  Does have a reported history of congestive heart failure that the most recent echocardiogram shows preserved EF.  Based on her comorbidities the extent of her symptom medic anemia do believe she benefit from observation in the hospital for hemodynamic monitoring and improvement after transfusion.      As part of my medical decision making, I reviewed the following data within the electronic MEDICAL RECORD NUMBER Nursing notes reviewed and incorporated, Labs reviewed, notes from prior ED  visits.  ____________________________________________   FINAL CLINICAL IMPRESSION(S) / ED DIAGNOSES  Final diagnoses:  Symptomatic anemia      NEW MEDICATIONS STARTED DURING THIS VISIT:  New Prescriptions   No medications on file     Note:  This document was prepared using Dragon voice recognition software and may include unintentional dictation errors.    Willy Eddy, MD 10/11/18 [phone]

## 2018-10-11 NOTE — ED Notes (Signed)
Report to Marta, RN.

## 2018-10-11 NOTE — H&P (Signed)
[employer] - [place] at [facility]   PATIENT NAME: Andrea Miller    MR#:  [id number]  DATE OF BIRTH:  Apr 04, 1986  DATE OF ADMISSION:  10/11/2018  PRIMARY CARE PHYSICIAN: Emogene Morgan, MD   REQUESTING/REFERRING PHYSICIAN: Dr. Roxan Hockey.  CHIEF COMPLAINT:   Chief Complaint  Patient presents with  . Anemia   Generalized weakness. HISTORY OF PRESENT ILLNESS:  Andrea Miller  is a 32 y.o. female with a known history of multiple medical problems as below.  The patient presented to ED with above chief complaints.  She was diagnosed with PE last month and started Xarelto.  She has had heavy periods since taking Xarelto.  But she denies any melena, bloody stool or hematuria.  He came to ED due to worsening generalized weakness and mild shortness of breath.  She denies any chest pain or palpitation or leg swelling.  Hemoglobin decreased from 10.52 weeks ago to 6.5 today.  Dr. Roxan Hockey ordered 2 unit PRBC transfusion and request admission. PAST MEDICAL HISTORY:   Past Medical History:  Diagnosis Date  . Anxiety   . Asthma   . Bipolar disorder (HCC)   . Chest pain    and angina  . CHF (congestive heart failure) (HCC)   . Depression   . Fibromyalgia   . Hypertension   . 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 Obesity   . Pre-diabetes   . Seizures (HCC)    secondary to anxiety  last seizure 01/25/18  . SVT (supraventricular tachycardia) (HCC)    with hx of syncope; managed by [facility]    PAST SURGICAL HISTORY:   Past Surgical History:  Procedure Laterality Date  . CHOLECYSTECTOMY N/A 02/22/2018   Procedure: LAPAROSCOPIC CHOLECYSTECTOMY;  Surgeon: Ancil Linsey, MD;  Location: [facility] ORS;  Service: General;  Laterality: N/A;  . TONSILLECTOMY AND ADENOIDECTOMY N/A 09/05/2016   Procedure: TONSILLECTOMY AND ADENOIDECTOMY;  Surgeon: Linus Salmons, MD;  Location: [facility] ORS;  Service: ENT;  Laterality: N/A;    SOCIAL HISTORY:     Social History   Tobacco Use  . Smoking status: Never Smoker  . Smokeless tobacco: Never Used  Substance Use Topics  . Alcohol use: No    FAMILY HISTORY:   Family History  Problem Relation Age of Onset  . Diabetes Mother   . Hypertension Mother   . Asthma Mother   . Gallstones Mother   . Diabetes Father   . Hypertension Father   . Gallstones Father   . Bipolar disorder Sister   . COPD Brother   . Schizophrenia Brother   . COPD Brother   . Hypertension Brother     DRUG ALLERGIES:   Allergies  Allergen Reactions  . Cortizone-10 [Hydrocortisone] Shortness Of Breath and Swelling  . Garlic Anaphylaxis  . Prednisone Swelling    Tongue swelling  . Corticosteroids Palpitations    REVIEW OF SYSTEMS:   Review of Systems  Constitutional: Positive for malaise/fatigue. Negative for chills and fever.  HENT: Negative for sore throat.   Eyes: Negative for blurred vision and double vision.  Respiratory: Positive for shortness of breath. Negative for cough, hemoptysis, wheezing and stridor.   Cardiovascular: Negative for chest pain, palpitations, orthopnea and leg swelling.  Gastrointestinal: Negative for abdominal pain, blood in stool, diarrhea, melena, nausea and vomiting.  Genitourinary: Negative for dysuria, flank pain and hematuria.  Musculoskeletal: Negative for back pain and joint pain.  Skin: Negative for rash.  Neurological: Positive for dizziness. Negative for sensory change, focal weakness, seizures, loss of consciousness, weakness and headaches.  Endo/Heme/Allergies: Negative for polydipsia.  Psychiatric/Behavioral: Negative for depression. The patient is not nervous/anxious.     MEDICATIONS AT HOME:   Prior to Admission medications   Medication Sig Start Date End Date Taking? Authorizing Provider  albuterol (PROVENTIL HFA;VENTOLIN HFA) 108 (90 Base) MCG/ACT inhaler Inhale 2 puffs into the lungs every 6 (six) hours as needed for wheezing or shortness of  breath. 07/31/18  Yes Merwyn Katos, MD  albuterol (PROVENTIL) (2.5 MG/3ML) 0.083% nebulizer solution Take 2.5 mg by nebulization every 4 (four) hours as needed for wheezing or shortness of breath.   Yes [provider]  budesonide-formoterol (SYMBICORT) 160-4.5 MCG/ACT inhaler Inhale 2 puffs into the lungs 2 (two) times daily. Patient taking differently: Inhale 1 puff into the lungs 2 (two) times daily.  01/31/18  Yes Merwyn Katos, MD  Calcium-Magnesium 500-250 MG TABS Take 1 tablet by mouth 2 (two) times daily.    Yes [provider]  clonazePAM (KLONOPIN) 2 MG tablet Take 2 mg by mouth 3 (three) times daily.    Yes [provider]  DEXILANT 30 MG capsule Take 1 capsule by mouth daily. 08/28/18  Yes [provider]  diltiazem (CARDIZEM CD) 360 MG 24 hr capsule Take 360 mg by mouth daily.    Yes [provider]  escitalopram (LEXAPRO) 20 MG tablet Take 20 mg by mouth daily.   Yes [provider]  fluticasone (FLONASE) 50 MCG/ACT nasal spray Place 2 sprays into both nostrils daily as needed for allergies.    Yes [provider]  folic acid (FOLVITE) 1 MG tablet Take 1 mg by mouth daily.  12/15/16  Yes [provider]  furosemide (LASIX) 20 MG tablet Take 40 mg by mouth 2 (two) times daily.    Yes [provider]  ketoconazole (NIZORAL) 2 % shampoo Apply 1 application topically every 30 (thirty) days.  10/01/18  Yes [provider]  lactulose (CHRONULAC) 10 GM/15ML solution Take 30 g by mouth daily as needed for mild constipation.  08/28/18  Yes [provider]  lamoTRIgine (LAMICTAL) 100 MG tablet Take 200 mg by mouth 2 (two) times daily.    Yes [provider]  linaclotide (LINZESS) 290 MCG CAPS capsule Take 290 mcg by mouth daily before breakfast.   Yes [provider]  mupirocin ointment (BACTROBAN) 2 % Apply 1 application topically 2 (two) times daily.  10/01/18  Yes [provider]  orlistat (XENICAL) 120 MG capsule Take 120 mg by mouth 3 (three) times daily with meals.   Yes [provider]  Potassium Chloride ER 20 MEQ TBCR Take 20 mEq by mouth 2 (two) times daily.    Yes [provider]  pregabalin (LYRICA) 50 MG capsule Take 1 capsule (50 mg total) by mouth 2 (two) times daily. 09/16/18  Yes Erick Blinks, MD  Prenatal Vit-Fe Fumarate-FA (MULTIVITAMIN-PRENATAL) 27-0.8 MG TABS tablet Take 1 tablet by mouth daily at 12 noon.   Yes [provider]  propranolol (INDERAL) 80 MG tablet Take 80 mg by mouth 3 (three) times daily.    Yes [provider]  risperiDONE (RISPERDAL) 3 MG tablet Take 3 mg by mouth daily.    Yes [provider]  Rivaroxaban 15 & 20 MG TBPK Take as directed on package: Start with one 15mg  tablet by mouth twice a day with food. On Day 22, switch  to one 20mg  tablet once a day with food. 09/16/18  Yes Erick Blinks, MD  rosuvastatin (CRESTOR) 20 MG tablet Take 20 mg by mouth daily.   Yes [provider]  tiZANidine (ZANAFLEX) 4 MG tablet Take 4 mg by mouth 3 (three) times daily.  03/26/17  Yes [provider]  Topiramate ER (TROKENDI XR) 200 MG CP24 Take 1 capsule by mouth daily.    Yes [provider]  promethazine (PHENERGAN) 12.5 MG tablet Take 2 tablets (25 mg total) by mouth every 6 (six) hours as needed for nausea or vomiting. Patient not taking: Reported on 10/11/2018 02/20/18   Governor Rooks, MD      VITAL SIGNS:  Blood pressure 131/82, pulse 89, temperature 98.2 F (36.8 C), temperature source Oral, resp. rate 18, height 5\' 9"  (1.753 m), weight (!) 167 kg, last menstrual period 10/04/2018, SpO2 100 %.  PHYSICAL EXAMINATION:  Physical Exam  GENERAL:  32 y.o.-year-old patient lying in the bed with no acute distress.  Morbid obesity. EYES: Pupils equal, round, reactive to light and accommodation. No scleral icterus. Extraocular muscles intact.  HEENT: Head  atraumatic, normocephalic. Oropharynx and nasopharynx clear.  NECK:  Supple, no jugular venous distention. No thyroid enlargement, no tenderness.  LUNGS: Normal breath sounds bilaterally, no wheezing, rales,rhonchi or crepitation. No use of accessory muscles of respiration.  CARDIOVASCULAR: S1, S2 normal. No murmurs, rubs, or gallops.  ABDOMEN: Soft, nontender, nondistended. Bowel sounds present. No organomegaly or mass.  EXTREMITIES: No pedal edema, cyanosis, or clubbing.  NEUROLOGIC: Cranial nerves II through XII are intact. Muscle strength 5/5 in all extremities. Sensation intact. Gait not checked.  PSYCHIATRIC: The patient is alert and oriented x 3.  SKIN: No obvious rash, lesion, or ulcer.   LABORATORY PANEL:   CBC Recent Labs  Lab 10/11/18 1252  WBC 5.1  HGB 6.5*  HCT 22.3*  PLT 304   ------------------------------------------------------------------------------------------------------------------  Chemistries  Recent Labs  Lab 10/11/18 1252  NA 140  K 4.0  CL 108  CO2 26  GLUCOSE 94  BUN 14  CREATININE 1.07*  CALCIUM 9.3  AST 15  ALT 8  ALKPHOS 65  BILITOT 0.3   ------------------------------------------------------------------------------------------------------------------  Cardiac Enzymes No results for input(s): TROPONINI in the last 168 hours. ------------------------------------------------------------------------------------------------------------------  RADIOLOGY:  No results found.    IMPRESSION AND PLAN:   Symptomatic anemia. The patient will be admitted to medical floor. PRBC transfusion 2 unit, follow-up hemoglobin and hematologist.  History of recent PE. Hold Xarelto and hematology consult.  Chronic diastolic CHF.  Stable.  Continue Lasix.  Hypertension.  Continue home hypertension medication.  Morbid obesity.  Heavy menses.  Follow-up with OB/GYN as outpatient.  All the records are reviewed and case discussed with ED  provider. Management plans discussed with the patient, family and they are in agreement.  CODE STATUS: Full code  TOTAL TIME TAKING CARE OF THIS PATIENT: 38 minutes.    Shaune Pollack M.D on 10/11/2018 at 5:29 PM  Between 7am to 6pm - Pager - [phone]  After 6pm go to [email or web address] - Scientist, research (life sciences) [facility]  Office  [phone]  CC: Primary care physician; Emogene Morgan, MD   Note: This dictation was prepared with Dragon dictation along with smaller phrase technology. Any transcriptional errors that result from this process are unin

## 2018-10-11 NOTE — ED Notes (Signed)
FIRST NURSE NOTE:  Pt arrived via POV from home with reports of low hemoglobin.  Received phone call from cancer center today that the patient had a hemoglobin of 6.5. Pt is currently on Xarelto for recent PE as well.  Per Quentin Mulling NP, pt's hemoglobin has been trending downward.

## 2018-10-11 NOTE — ED Notes (Signed)
Pt non compliant with monitor leads.

## 2018-10-11 NOTE — Progress Notes (Signed)
Here for new pt evaluation.  

## 2018-10-12 DIAGNOSIS — I2699 Other pulmonary embolism without acute cor pulmonale: Secondary | ICD-10-CM

## 2018-10-12 DIAGNOSIS — D509 Iron deficiency anemia, unspecified: Secondary | ICD-10-CM

## 2018-10-12 DIAGNOSIS — E538 Deficiency of other specified B group vitamins: Secondary | ICD-10-CM

## 2018-10-12 DIAGNOSIS — N920 Excessive and frequent menstruation with regular cycle: Secondary | ICD-10-CM

## 2018-10-12 LAB — BASIC METABOLIC PANEL
Anion gap: 9 (ref 5–15)
BUN: 16 mg/dL (ref 6–20)
CO2: 24 mmol/L (ref 22–32)
Calcium: 8.7 mg/dL — ABNORMAL LOW (ref 8.9–10.3)
Chloride: 106 mmol/L (ref 98–111)
Creatinine, Ser: 0.87 mg/dL (ref 0.44–1.00)
GFR calc Af Amer: >60 mL/min (ref >60)
GFR calc non Af Amer: >60 mL/min (ref >60)
Glucose, Bld: 119 mg/dL — ABNORMAL HIGH (ref 70–99)
Potassium: 3.4 mmol/L — ABNORMAL LOW (ref 3.5–5.1)
Sodium: 139 mmol/L (ref 135–145)

## 2018-10-12 LAB — CBC
HCT: 24.5 % — ABNORMAL LOW (ref 36.0–46.0)
Hemoglobin: 7.5 g/dL — ABNORMAL LOW (ref 12.0–15.0)
MCH: 25.5 pg — ABNORMAL LOW (ref 26.0–34.0)
MCHC: 30.6 g/dL (ref 30.0–36.0)
MCV: 83.3 fL (ref 80.0–100.0)
Platelets: 241 10*3/uL (ref 150–400)
RBC: 2.94 MIL/uL — ABNORMAL LOW (ref 3.87–5.11)
RDW: 15.6 % — ABNORMAL HIGH (ref 11.5–15.5)
WBC: 4.8 10*3/uL (ref 4.0–10.5)
nRBC: 0.6 % — ABNORMAL HIGH (ref 0.0–0.2)

## 2018-10-12 LAB — BETA-2-GLYCOPROTEIN I ABS, IGG/M/A
Beta-2 Glyco I IgG: <9 GPI IgG units (ref 0–20)
Beta-2-Glycoprotein I IgA: <9 GPI IgA units (ref 0–25)
Beta-2-Glycoprotein I IgM: <9 GPI IgM units (ref 0–32)

## 2018-10-12 LAB — URINALYSIS, COMPLETE (UACMP) WITH MICROSCOPIC
Bilirubin Urine: NEGATIVE
Glucose, UA: NEGATIVE mg/dL
Ketones, ur: NEGATIVE mg/dL
Leukocytes, UA: NEGATIVE
Nitrite: NEGATIVE
Protein, ur: NEGATIVE mg/dL
Specific Gravity, Urine: 1.024 (ref 1.005–1.030)
pH: 5 (ref 5.0–8.0)

## 2018-10-12 LAB — OCCULT BLOOD X 1 CARD TO LAB, STOOL: Fecal Occult Bld: NEGATIVE

## 2018-10-12 LAB — PREPARE RBC (CROSSMATCH)

## 2018-10-12 LAB — HEMOGLOBIN AND HEMATOCRIT, BLOOD
HCT: 29.5 % — ABNORMAL LOW (ref 36.0–46.0)
Hemoglobin: 9.3 g/dL — ABNORMAL LOW (ref 12.0–15.0)

## 2018-10-12 MED ORDER — DIPHENHYDRAMINE HCL 25 MG PO CAPS
25.0000 mg | ORAL_CAPSULE | Freq: Once | ORAL | Status: AC
  Administered 2018-10-12: 25 mg via ORAL
  Filled 2018-10-12: qty 1

## 2018-10-12 MED ORDER — SODIUM CHLORIDE 0.9% IV SOLUTION
Freq: Once | INTRAVENOUS | Status: AC
  Administered 2018-10-12: 08:00:00 via INTRAVENOUS

## 2018-10-12 MED ORDER — SODIUM CHLORIDE 0.9% FLUSH
3.0000 mL | INTRAVENOUS | Status: DC | PRN
  Administered 2018-10-12: 3 mL via INTRAVENOUS
  Filled 2018-10-12: qty 3

## 2018-10-12 MED ORDER — ACETAMINOPHEN 325 MG PO TABS
650.0000 mg | ORAL_TABLET | Freq: Once | ORAL | Status: AC
  Administered 2018-10-12: 08:00:00 650 mg via ORAL
  Filled 2018-10-12: qty 2

## 2018-10-12 MED ORDER — FUROSEMIDE 10 MG/ML IJ SOLN
20.0000 mg | Freq: Once | INTRAMUSCULAR | Status: AC
  Administered 2018-10-12: 10:00:00 20 mg via INTRAVENOUS
  Filled 2018-10-12: qty 2

## 2018-10-12 MED ORDER — SODIUM CHLORIDE 0.9% FLUSH
3.0000 mL | Freq: Two times a day (BID) | INTRAVENOUS | Status: DC
  Administered 2018-10-12 – 2018-10-13 (×3): 3 mL via INTRAVENOUS

## 2018-10-12 MED ORDER — SODIUM CHLORIDE 0.9 % IV BOLUS
1000.0000 mL | Freq: Once | INTRAVENOUS | Status: AC
  Administered 2018-10-12: 1000 mL via INTRAVENOUS

## 2018-10-12 MED ORDER — TOPIRAMATE 100 MG PO TABS
100.0000 mg | ORAL_TABLET | Freq: Two times a day (BID) | ORAL | Status: DC
  Administered 2018-10-12 – 2018-10-13 (×3): 100 mg via ORAL
  Filled 2018-10-12 (×4): qty 1

## 2018-10-12 NOTE — Progress Notes (Signed)
[employer] [facility] [facility] [place] at [facility]   PATIENT NAME: Andrea Miller    MR#:  [id number]  DATE OF BIRTH:  [birth year]  SUBJECTIVE:  CHIEF COMPLAINT:   Chief Complaint  Patient presents with  . Anemia  Patient without complaint, no events overnight, currently receiving more blood given hemoglobin of 7.5 up from 6.5, Xarelto on hold  REVIEW OF SYSTEMS:  CONSTITUTIONAL: No fever, fatigue or weakness.  EYES: No blurred or double vision.  EARS, NOSE, AND THROAT: No tinnitus or ear pain.  RESPIRATORY: No cough, shortness of breath, wheezing or hemoptysis.  CARDIOVASCULAR: No chest pain, orthopnea, edema.  GASTROINTESTINAL: No nausea, vomiting, diarrhea or abdominal pain.  GENITOURINARY: No dysuria, hematuria.  ENDOCRINE: No polyuria, nocturia,  HEMATOLOGY: No anemia, easy bruising or bleeding SKIN: No rash or lesion. MUSCULOSKELETAL: No joint pain or arthritis.   NEUROLOGIC: No tingling, numbness, weakness.  PSYCHIATRY: No anxiety or depression.   ROS  DRUG ALLERGIES:   Allergies  Allergen Reactions  . Cortizone-10 [Hydrocortisone] Shortness Of Breath and Swelling  . Garlic Anaphylaxis  . Prednisone Swelling    Tongue swelling  . Corticosteroids Palpitations    VITALS:  Blood pressure 104/60, pulse 72, temperature 98 F (36.7 C), temperature source Oral, resp. rate 18, height 5\' 9"  (1.753 m), weight (!) 167 kg, last menstrual period 10/04/2018, SpO2 99 %.  PHYSICAL EXAMINATION:  GENERAL:  32 y.o.-year-old patient lying in the bed with no acute distress.  EYES: Pupils equal, round, reactive to light and accommodation. No scleral icterus. Extraocular muscles intact.  HEENT: Head atraumatic, normocephalic. Oropharynx and nasopharynx clear.  NECK:  Supple, no jugular venous distention. No thyroid enlargement, no tenderness.  LUNGS: Normal breath sounds bilaterally, no wheezing, rales,rhonchi or crepitation. No use of accessory muscles of respiration.   CARDIOVASCULAR: S1, S2 normal. No murmurs, rubs, or gallops.  ABDOMEN: Soft, nontender, nondistended. Bowel sounds present. No organomegaly or mass.  EXTREMITIES: No pedal edema, cyanosis, or clubbing.  NEUROLOGIC: Cranial nerves II through XII are intact. Muscle strength 5/5 in all extremities. Sensation intact. Gait not checked.  PSYCHIATRIC: The patient is alert and oriented x 3.  SKIN: No obvious rash, lesion, or ulcer.   Physical Exam LABORATORY PANEL:   CBC Recent Labs  Lab 10/12/18 0606  WBC 4.8  HGB 7.5*  HCT 24.5*  PLT 241   ------------------------------------------------------------------------------------------------------------------  Chemistries  Recent Labs  Lab 10/11/18 1252 10/12/18 0606  NA 140 139  K 4.0 3.4*  CL 108 106  CO2 26 24  GLUCOSE 94 119*  BUN 14 16  CREATININE 1.07* 0.87  CALCIUM 9.3 8.7*  AST 15  --   ALT 8  --   ALKPHOS 65  --   BILITOT 0.3  --    ------------------------------------------------------------------------------------------------------------------  Cardiac Enzymes No results for input(s): TROPONINI in the last 168 hours. ------------------------------------------------------------------------------------------------------------------  RADIOLOGY:  No results found.  ASSESSMENT AND PLAN:  *Symptomatic anemia. Most likely secondary to multifactorial process which includes heavy periods-compounded by Xarelto use  Continue to hold Xarelto, continue blood transfusion, strict I&O monitoring, CBC in the morning, and transfuse as needed  Hematology input appreciated  *History of recent PE. Team to hold Xarelto for now   *Chronic diastolic CHF without exacerbation Continue home regiment  *Hypertension Continue home hypertension medication.  *Chronic extreme morbid obesity  Exam modification recommended   *Chronic heavy menses  We will need to follow-up with OB/GYN outpatient continue medical management   All  the records are reviewed  and case discussed with Care Management/Social Workerr. Management plans discussed with the patient, family and they are in agreement.  CODE STATUS: full  TOTAL TIME TAKING CARE OF THIS PATIENT: 40 minutes.     POSSIBLE D/C IN 1-2 DAYS, DEPENDING ON CLINICAL CONDITION.   Evelena Asa Salary M.D on 10/12/2018   Between 7am to 6pm - Pager - [phone]  After 6pm go to [email or web address] - Social research officer, government  [facility] [place] [facility]  Office  [phone]  CC: Primary care physician; Emogene Morgan, MD  Note: This dictation was prepared with Dragon dictation along with smaller phrase technology. Any transcriptional errors that result from this process are unintentional.

## 2018-10-12 NOTE — Progress Notes (Signed)
Pt had 2 units of pRBC during shift. Pt BP got progressively low and bolus dose is being administered. Pt BP is trending up. Pt is lethargic as well. Will continue to monitor.

## 2018-10-12 NOTE — Progress Notes (Addendum)
One unit PRBC's transfused and tolerated well. Recheck Hgb 9.5 GM. No sign bleeding; Stool soft formed to loose yellow brown in nature. Eating well; asking for extra foods. Reports she frequently for ice chips. VSS's improved.  Up to [facility] with SB+. Dull affect, impulsive getting OOB, speech difficult to understand. Re-educated on fall prevention- call before getting OOB. Refused safety socks at this time. Pharmacy requests pt bring her own med-Orlistat in with pt requested to have someone bring her own Orlistat in and states she understands. "It's just my weight loss pill."

## 2018-10-12 NOTE — Consult Note (Signed)
[facility]  Date of admission:  10/11/2018  Inpatient day:  10/12/2018  Consulting physician: Dr. Demetrios Loll   Reason for Consultation:  Anemia.  Chief Complaint: Andrea Miller is a 32 y.o. female with a recent history of bilateral pulmonary emboli on Xarelto who was admitted through the emergency room with symptomatic anemia.  HPI: * The patient was seen in consultation yesterday for her history of PE (see clinic note 10/11/2018).  As part of her work-up, CBC revealed a hematocrit of 22.3, hemoglobin 6.5, MCV 83.5, and platelets 304,000.  Hemoglobin was 13.2 on 10/20, 11.8 on 10/21, and 10.5 on 09/24/2018.  She was directed to the emergency room.  She notes a history of heavy menses since discontinuation of her birth control pills.  She describes 3 weeks of heavy menses, but no menses x 2 weeks.  Menses was "so heavy" that she "worse a diaper and 2-3 pads at a time".  She denied any hematuria, melena or hematochezia.    She has felt weak and lightheaded.  She has shortness of breath with exertion.  She has chronic tachycardia and at times chest discomfort.  She notes ice pica.  Additional labs drawn in clinic included a creatinine 1.07, normal LFTs, ferritin 4, iron saturation 3% with a TIBC of 409, folate 8.1, and B12 of 242.  She is currently receiving her 3rd unit of PRBCs.  She is currently not having any menstrual bleeding.   Past Medical History:  Diagnosis Date  . Anxiety   . Asthma   . Bipolar disorder ([place])   . Chest pain    and angina  . CHF (congestive heart failure) ([place])   . Depression   . Fibromyalgia   . Hypertension   . 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 Obesity   . Pre-diabetes   . Seizures ([place])    secondary to anxiety  last seizure 01/25/18  . SVT (supraventricular tachycardia) ([facility])    with hx of syncope; managed by [facility]    Past Surgical History:  Procedure Laterality Date  .  CHOLECYSTECTOMY N/A 02/22/2018   Procedure: LAPAROSCOPIC CHOLECYSTECTOMY;  Surgeon: Vickie Epley, MD;  Location: [facility] ORS;  Service: General;  Laterality: N/A;  . TONSILLECTOMY AND ADENOIDECTOMY N/A 09/05/2016   Procedure: TONSILLECTOMY AND ADENOIDECTOMY;  Surgeon: Beverly Gust, MD;  Location: [facility] ORS;  Service: ENT;  Laterality: N/A;    Family History  Problem Relation Age of Onset  . Diabetes Mother   . Hypertension Mother   . Asthma Mother   . Gallstones Mother   . Diabetes Father   . Hypertension Father   . Gallstones Father   . Bipolar disorder Sister   . COPD Brother   . Schizophrenia Brother   . COPD Brother   . Hypertension Brother     Social History:  reports that she has never smoked. She has never used smokeless tobacco. She reports that she does not drink alcohol or use drugs.  She is alone today.  Allergies:  Allergies  Allergen Reactions  . Cortizone-10 [Hydrocortisone] Shortness Of Breath and Swelling  . Garlic Anaphylaxis  . Prednisone Swelling    Tongue swelling  . Corticosteroids Palpitations    Medications Prior to Admission  Medication Sig Dispense Refill  . albuterol (PROVENTIL HFA;VENTOLIN HFA) 108 (90 Base) MCG/ACT inhaler Inhale 2 puffs into the lungs every 6 (six) hours as needed for wheezing or shortness of breath. 1 Inhaler  10  . albuterol (PROVENTIL) (2.5 MG/3ML) 0.083% nebulizer solution Take 2.5 mg by nebulization every 4 (four) hours as needed for wheezing or shortness of breath.    . budesonide-formoterol (SYMBICORT) 160-4.5 MCG/ACT inhaler Inhale 2 puffs into the lungs 2 (two) times daily. (Patient taking differently: Inhale 1 puff into the lungs 2 (two) times daily. ) 1 Inhaler 10  . Calcium-Magnesium 500-250 MG TABS Take 1 tablet by mouth 2 (two) times daily.     . clonazePAM (KLONOPIN) 2 MG tablet Take 2 mg by mouth 3 (three) times daily.     Marland Kitchen DEXILANT 30 MG capsule Take 1 capsule by mouth daily.  5  . diltiazem (CARDIZEM CD) 360  MG 24 hr capsule Take 360 mg by mouth daily.     Marland Kitchen escitalopram (LEXAPRO) 20 MG tablet Take 20 mg by mouth daily.    . fluticasone (FLONASE) 50 MCG/ACT nasal spray Place 2 sprays into both nostrils daily as needed for allergies.     . folic acid (FOLVITE) 1 MG tablet Take 1 mg by mouth daily.   10  . furosemide (LASIX) 20 MG tablet Take 40 mg by mouth 2 (two) times daily.     Marland Kitchen ketoconazole (NIZORAL) 2 % shampoo Apply 1 application topically every 30 (thirty) days.   1  . lactulose (CHRONULAC) 10 GM/15ML solution Take 30 g by mouth daily as needed for mild constipation.   5  . lamoTRIgine (LAMICTAL) 100 MG tablet Take 200 mg by mouth 2 (two) times daily.     Marland Kitchen linaclotide (LINZESS) 290 MCG CAPS capsule Take 290 mcg by mouth daily before breakfast.    . mupirocin ointment (BACTROBAN) 2 % Apply 1 application topically 2 (two) times daily.   0  . orlistat (XENICAL) 120 MG capsule Take 120 mg by mouth 3 (three) times daily with meals.    . Potassium Chloride ER 20 MEQ TBCR Take 20 mEq by mouth 2 (two) times daily.     . pregabalin (LYRICA) 50 MG capsule Take 1 capsule (50 mg total) by mouth 2 (two) times daily. 60 capsule 0  . Prenatal Vit-Fe Fumarate-FA (MULTIVITAMIN-PRENATAL) 27-0.8 MG TABS tablet Take 1 tablet by mouth daily at 12 noon.    . propranolol (INDERAL) 80 MG tablet Take 80 mg by mouth 3 (three) times daily.     . risperiDONE (RISPERDAL) 3 MG tablet Take 3 mg by mouth daily.     . Rivaroxaban 15 & 20 MG TBPK Take as directed on package: Start with one '15mg'$  tablet by mouth twice a day with food. On Day 22, switch to one '20mg'$  tablet once a day with food. 51 each 0  . rosuvastatin (CRESTOR) 20 MG tablet Take 20 mg by mouth daily.    Marland Kitchen tiZANidine (ZANAFLEX) 4 MG tablet Take 4 mg by mouth 3 (three) times daily.   1  . Topiramate ER (TROKENDI XR) 200 MG CP24 Take 1 capsule by mouth daily.     . promethazine (PHENERGAN) 12.5 MG tablet Take 2 tablets (25 mg total) by mouth every 6 (six) hours as  needed for nausea or vomiting. (Patient not taking: Reported on 10/11/2018) 20 tablet 0    Review of Systems: GENERAL:  Fatigue.  No fevers, sweats.  Intentional weight loss of 30 pounds (Weight Watcher's). PERFORMANCE STATUS (ECOG):  1-2 HEENT:  No visual changes, runny nose, sore throat, mouth sores or tenderness. Lungs: Shortness of breath. Dry cough.  No hemoptysis. Cardiac:  Chronic heart  issues (tachycardia).  No chest pain, orthopnea, or PND. GI:  No nausea, vomiting, diarrhea, constipation, melena or hematochezia. Ice pica. GU:  Trouble holding urine.  No urgency, frequency, dysuria, or hematuria.  History of heavy menses. Musculoskeletal:  Bulging disk.  No joint pain.  No muscle tenderness. Extremities:  No pain or swelling. Skin:  No rashes or skin changes. Neuro:  Chronic headaches.  No umbness or weakness, balance or coordination issues. Endocrine:  No diabetes, thyroid issues, hot flashes or night sweats. Psych:  No mood changes, depression or anxiety. Pain:  No focal pain. Review of systems:  All other systems reviewed and found to be negative.  Physical Exam:  Blood pressure 104/68, pulse 78, temperature 98.2 F (36.8 C), temperature source Oral, resp. rate 20, height _0  (1.753 m), weight (!) 368 lb 2.7 oz (167 kg), last menstrual period 10/04/2018, SpO2 100 %.  GENERAL:  Well developed, well nourished, heavyset woman sitting comfortably on the medical unit watching TV in no acute distress. MENTAL STATUS:  Alert and oriented to person, place and time. HEAD:  Dark hair.  Normocephalic, atraumatic, face symmetric, no Cushingoid features. EYES:  Glasses.  Brown eyes.  Pupils equal round and reactive to light and accomodation.  No conjunctivitis or scleral icterus. ENT:  Oropharynx clear without lesion.  Tongue normal. Mucous membranes moist.  RESPIRATORY:  Clear to auscultation without rales, wheezes or rhonchi. CARDIOVASCULAR:  Regular rate and rhythm without murmur, rub  or gallop. ABDOMEN:  Fully round.  Soft, non-tender, with active bowel sounds, and no hepatosplenomegaly.  No masses. SKIN:  No rashes, ulcers or lesions. EXTREMITIES: No edema, no skin discoloration or tenderness.  No palpable cords. NEUROLOGICAL: Unremarkable. PSYCH:  Appropriate.   Results for orders placed or performed during the hospital encounter of 10/11/18 (from the past 48 hour(s))  Type and screen [place]     Status: None (Preliminary result)   Collection Time: 10/11/18  3:46 PM  Result Value Ref Range   ABO/RH(D) O POS    Antibody Screen NEG    Sample Expiration 10/14/2018    Unit Number [id number]    Blood Component Type RBC LR PHER2    Unit division 00    Status of Unit ISSUED,FINAL    Transfusion Status OK TO TRANSFUSE    Crossmatch Result Compatible    Unit Number [id number]    Blood Component Type RBC LR PHER1    Unit division 00    Status of Unit ISSUED    Transfusion Status OK TO TRANSFUSE    Crossmatch Result Compatible    Unit Number [id number]    Blood Component Type RED CELLS,LR    Unit division 00    Status of Unit ISSUED    Transfusion Status OK TO TRANSFUSE    Crossmatch Result      Compatible Performed at [facility], [address] [place], [place] [address]   Prepare RBC     Status: None   Collection Time: 10/11/18  4:10 PM  Result Value Ref Range   Order Confirmation      ORDER PROCESSED BY BLOOD BANK Performed at [facility], [address], [place], [place] [address]   ABO/Rh     Status: None   Collection Time: 10/11/18  6:15 PM  Result Value Ref Range   ABO/RH(D)      O POS Performed at [facility], [address] [place], [place] [address]   Basic metabolic panel  Status: Abnormal   Collection Time: 10/12/18  6:06 AM  Result Value Ref Range   Sodium 139 135 - 145 mmol/L   Potassium 3.4 (L) 3.5 - 5.1 mmol/L   Chloride 106 98 - 111 mmol/L   CO2 24 22 - 32  mmol/L   Glucose, Bld 119 (H) 70 - 99 mg/dL   BUN 16 6 - 20 mg/dL   Creatinine, Ser 0.87 0.44 - 1.00 mg/dL   Calcium 8.7 (L) 8.9 - 10.3 mg/dL   GFR calc non Af Amer >60 >60 mL/min   GFR calc Af Amer >60 >60 mL/min    Comment: (NOTE) The eGFR has been calculated using the CKD EPI equation. This calculation has not been validated in all clinical situations. eGFR's persistently <60 mL/min signify possible Chronic Kidney Disease.    Anion gap 9 5 - 15    Comment: Performed at [facility], [address] [place], [place] [address]  CBC     Status: Abnormal   Collection Time: 10/12/18  6:06 AM  Result Value Ref Range   WBC 4.8 4.0 - 10.5 K/uL   RBC 2.94 (L) 3.87 - 5.11 MIL/uL   Hemoglobin 7.5 (L) 12.0 - 15.0 g/dL   HCT 24.5 (L) 36.0 - 46.0 %   MCV 83.3 80.0 - 100.0 fL   MCH 25.5 (L) 26.0 - 34.0 pg   MCHC 30.6 30.0 - 36.0 g/dL   RDW 15.6 (H) 11.5 - 15.5 %   Platelets 241 150 - 400 K/uL   nRBC 0.6 (H) 0.0 - 0.2 %    Comment: Performed at [facility], [address] [place], [place] [address]  Prepare RBC     Status: None   Collection Time: 10/12/18  7:17 AM  Result Value Ref Range   Order Confirmation      ORDER PROCESSED BY BLOOD BANK Performed at [facility], [address] [place], [place] [address]   Hemoglobin and hematocrit, blood     Status: Abnormal   Collection Time: 10/12/18  1:43 PM  Result Value Ref Range   Hemoglobin 9.3 (L) 12.0 - 15.0 g/dL   HCT 29.5 (L) 36.0 - 46.0 %    Comment: Performed at [facility], [address], [place], [place] [address]   No results found.  Assessment:  The patient is a 32 y.o. woman with a recent history of small bilateral pulmonary emboli on Xarelto.  Risk factors for thrombosis include obesity and birth control pills.    Chest CT angiogram on 09/15/2018 revealed small bilateral pulmonary emboli and mild cardiomegaly.  Bilateral lower extremity duplex on 09/16/2018 revealed no evidence  of lower extremity DVT.    She has iron deficiency anemia.  She notes a recent history of extremely heavy menses x 3 weeks (none in past 2 weeks).  Hemoglobin was 10.5 on 09/24/2018 and 6.5 on 10/10/2018.  Work-up on 10/10/2018 revealed a ferritin of 4, iron saturation 3%.  She has ice pica.  She has B12 deficiency.  B12 was 242 on 10/10/2018.  Diet has been protein shakes and Weight Watcher's.  Symptomatically, she is fatigued.  She notes shortness of breath with exertion.  She denies any melena, hematochezia or hematuria.  Exam is stable.  Plan:   1. Iron deficiency anemia:  Etiology of iron deficiency appears to be due to extremely heavy menses (none in 2 weeks).  Check urine to r/o hematuria.  Guaiac all stool.  Patient currently in midst of  her third unit of PRBCs.  Patient interested in a trial of oral iron rather than IV iron.  If hemoglobin does not increase appropriately and urine and stool negative for blood, consider CT scan to r/o retroperitoneal bleed. 2.  B12 deficiency:  Patient would like to begin B12 injections (rather than a trial of oral B12). 3.  Menorrhagia:  Discuss GYN consult secondary to heavy menses.  Patient cannot be on BCP secondary to recent bilateral pulmonary emboli.  Thank you for allowing me to participate in NETRA POSTLETHWAIT 's care.  I will follow her closely with you while hospitalized and after discharge in the outpatient department.   Lequita Asal, MD  10/12/2018, 3:42 PM

## 2018-10-13 ENCOUNTER — Other Ambulatory Visit: Payer: Self-pay | Admitting: Hematology and Oncology

## 2018-10-13 DIAGNOSIS — E538 Deficiency of other specified B group vitamins: Secondary | ICD-10-CM

## 2018-10-13 LAB — BPAM RBC
Blood Product Expiration Date: 201912062359
Blood Product Expiration Date: 201912062359
Blood Product Expiration Date: 201912092359
ISSUE DATE / TIME: 201911152115
ISSUE DATE / TIME: 201911160145
ISSUE DATE / TIME: 201911160836
Unit Type and Rh: 5100
Unit Type and Rh: 5100
Unit Type and Rh: 5100

## 2018-10-13 LAB — TYPE AND SCREEN
ABO/RH(D): O POS
Antibody Screen: NEGATIVE
Unit division: 0
Unit division: 0
Unit division: 0

## 2018-10-13 LAB — BILIRUBIN, TOTAL: Total Bilirubin: 0.4 mg/dL (ref 0.3–1.2)

## 2018-10-13 LAB — DAT, POLYSPECIFIC AHG (ARMC ONLY): Polyspecific AHG test: NEGATIVE

## 2018-10-13 MED ORDER — FERROUS SULFATE 325 (65 FE) MG PO TABS
325.0000 mg | ORAL_TABLET | Freq: Two times a day (BID) | ORAL | Status: DC
  Administered 2018-10-13: 10:00:00 325 mg via ORAL
  Filled 2018-10-13: qty 1

## 2018-10-13 MED ORDER — FERROUS SULFATE 325 (65 FE) MG PO TABS: 325.0000 mg | ORAL_TABLET | Freq: Two times a day (BID) | ORAL | 0 refills | Status: DC

## 2018-10-13 MED ORDER — VITAMIN C 500 MG PO TABS
250.0000 mg | ORAL_TABLET | Freq: Two times a day (BID) | ORAL | Status: DC
  Administered 2018-10-13: 250 mg via ORAL
  Filled 2018-10-13: qty 1

## 2018-10-13 MED ORDER — PROPRANOLOL HCL 40 MG PO TABS: 80.0000 mg | ORAL_TABLET | Freq: Three times a day (TID) | ORAL | 0 refills | Status: DC

## 2018-10-13 MED ORDER — ASCORBIC ACID 250 MG PO TABS: 250.0000 mg | ORAL_TABLET | Freq: Two times a day (BID) | ORAL | 0 refills | Status: DC

## 2018-10-13 MED ORDER — CYANOCOBALAMIN 1000 MCG/ML IJ SOLN
1000.0000 ug | Freq: Once | INTRAMUSCULAR | Status: AC
  Administered 2018-10-13: 10:00:00 1000 ug via INTRAMUSCULAR
  Filled 2018-10-13: qty 1

## 2018-10-13 MED ORDER — VITAMIN B-12 1000 MCG PO TABS: 1000.0000 ug | ORAL_TABLET | Freq: Every day | ORAL | 0 refills | Status: DC

## 2018-10-13 NOTE — Progress Notes (Signed)
Pt has been discharged home.  Discharge papers given and explained to pt.  Pt verbalized understanding.  Meds and f/u appointments reviewed.  Rx given. Pt wants to walk, refuses to be escorted in a wheelchair.

## 2018-10-13 NOTE — Discharge Summary (Signed)
[facility] - [place] at [facility]   PATIENT NAME: Andrea Miller    MR#:  [id number]  DATE OF BIRTH:  1986-09-05  DATE OF ADMISSION:  10/11/2018 ADMITTING PHYSICIAN: Shaune Pollack, MD  DATE OF DISCHARGE: No discharge date for patient encounter.  PRIMARY CARE PHYSICIAN: Emogene Morgan, MD    ADMISSION DIAGNOSIS:  Symptomatic anemia [D64.9]  DISCHARGE DIAGNOSIS:  Active Problems:   Symptomatic anemia   SECONDARY DIAGNOSIS:   Past Medical History:  Diagnosis Date  . Anxiety   . Asthma   . Bipolar disorder (HCC)   . Chest pain    and angina  . CHF (congestive heart failure) (HCC)   . Depression   . Fibromyalgia   . Hypertension   . 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 Obesity   . Pre-diabetes   . Seizures (HCC)    secondary to anxiety  last seizure 01/25/18  . SVT (supraventricular tachycardia) (HCC)    with hx of syncope; managed by [facility] COURSE:   *Symptomatic anemia. Resolved Most likely secondary to multifactorial process which includes heavy periods-compounded by Xarelto use  Held Xarelto while in house, status post 3 unit packed red blood cell transfusion while in house, patient will follow up with gynecology status post discharge for further medical management/work-up of dysfunctional uterine bleeding, patient seen by hematology/oncology while in house/Dr. Corcoran-patient treated with IV iron, started on iron supplementation, did receive vitamin B12 injection, will go home on vitamin B12 daily as well, stool guaiac was negative  *History of recent PE. Xarelto held while in house, restarted at discharge    *Chronic diastolic CHF without exacerbation Controlled on current regiment  *Hypertension Controlled on current regiment  *Chronic extreme morbid obesity  Exam modification recommended   *Chronic heavy menses  We will have patient follow-up with OB/GYN as stated above    DISCHARGE CONDITIONS:   stable  CONSULTS OBTAINED:    DRUG ALLERGIES:   Allergies  Allergen Reactions  . Cortizone-10 [Hydrocortisone] Shortness Of Breath and Swelling  . Garlic Anaphylaxis  . Prednisone Swelling    Tongue swelling  . Corticosteroids Palpitations    DISCHARGE MEDICATIONS:   Allergies as of 10/13/2018      Reactions   Cortizone-10 [hydrocortisone] Shortness Of Breath, Swelling   Garlic Anaphylaxis   Prednisone Swelling   Tongue swelling   Corticosteroids Palpitations      Medication List    STOP taking these medications   diltiazem 360 MG 24 hr capsule Commonly known as:  CARDIZEM CD     TAKE these medications   albuterol (2.5 MG/3ML) 0.083% nebulizer solution Commonly known as:  PROVENTIL Take 2.5 mg by nebulization every 4 (four) hours as needed for wheezing or shortness of breath.   albuterol 108 (90 Base) MCG/ACT inhaler Commonly known as:  PROVENTIL HFA;VENTOLIN HFA Inhale 2 puffs into the lungs every 6 (six) hours as needed for wheezing or shortness of breath.   ascorbic acid 250 MG tablet Commonly known as:  VITAMIN C Take 1 tablet (250 mg total) by mouth 2 (two) times daily.   budesonide-formoterol 160-4.5 MCG/ACT inhaler Commonly known as:  SYMBICORT Inhale 2 puffs into the lungs 2 (two) times daily. What changed:  how much to take   Calcium-Magnesium 500-250 MG Tabs Take 1 tablet by mouth 2 (two) times daily.   clonazePAM 2 MG tablet Commonly known as:  KLONOPIN Take 2 mg by  mouth 3 (three) times daily.   DEXILANT 30 MG capsule Generic drug:  Dexlansoprazole Take 1 capsule by mouth daily.   escitalopram 20 MG tablet Commonly known as:  LEXAPRO Take 20 mg by mouth daily.   ferrous sulfate 325 (65 FE) MG tablet Take 1 tablet (325 mg total) by mouth 2 (two) times daily with a meal.   fluticasone 50 MCG/ACT nasal spray Commonly known as:  FLONASE Place 2 sprays into both nostrils daily as needed for allergies.    folic acid 1 MG tablet Commonly known as:  FOLVITE Take 1 mg by mouth daily.   furosemide 20 MG tablet Commonly known as:  LASIX Take 40 mg by mouth 2 (two) times daily.   ketoconazole 2 % shampoo Commonly known as:  NIZORAL Apply 1 application topically every 30 (thirty) days.   lactulose 10 GM/15ML solution Commonly known as:  CHRONULAC Take 30 g by mouth daily as needed for mild constipation.   lamoTRIgine 100 MG tablet Commonly known as:  LAMICTAL Take 200 mg by mouth 2 (two) times daily.   linaclotide 290 MCG Caps capsule Commonly known as:  LINZESS Take 290 mcg by mouth daily before breakfast.   multivitamin-prenatal 27-0.8 MG Tabs tablet Take 1 tablet by mouth daily at 12 noon.   mupirocin ointment 2 % Commonly known as:  BACTROBAN Apply 1 application topically 2 (two) times daily.   orlistat 120 MG capsule Commonly known as:  XENICAL Take 120 mg by mouth 3 (three) times daily with meals.   Potassium Chloride ER 20 MEQ Tbcr Take 20 mEq by mouth 2 (two) times daily.   pregabalin 50 MG capsule Commonly known as:  LYRICA Take 1 capsule (50 mg total) by mouth 2 (two) times daily.   promethazine 12.5 MG tablet Commonly known as:  PHENERGAN Take 2 tablets (25 mg total) by mouth every 6 (six) hours as needed for nausea or vomiting.   propranolol 40 MG tablet Commonly known as:  INDERAL Take 2 tablets (80 mg total) by mouth 3 (three) times daily. What changed:  medication strength   risperiDONE 3 MG tablet Commonly known as:  RISPERDAL Take 3 mg by mouth daily.   Rivaroxaban 15 & 20 MG Tbpk Take as directed on package: Start with one 15mg  tablet by mouth twice a day with food. On Day 22, switch to one 20mg  tablet once a day with food.   rosuvastatin 20 MG tablet Commonly known as:  CRESTOR Take 20 mg by mouth daily.   tiZANidine 4 MG tablet Commonly known as:  ZANAFLEX Take 4 mg by mouth 3 (three) times daily.   TROKENDI XR 200 MG Cp24 Generic drug:   Topiramate ER Take 1 capsule by mouth daily.   vitamin B-12 1000 MCG tablet Commonly known as:  CYANOCOBALAMIN Take 1 tablet (1,000 mcg total) by mouth daily.        DISCHARGE INSTRUCTIONS:      If you experience worsening of your admission symptoms, develop shortness of breath, life threatening emergency, suicidal or homicidal thoughts you must seek medical attention immediately by calling 911 or calling your MD immediately  if symptoms less severe.  You Must read complete instructions/literature along with all the possible adverse reactions/side effects for all the Medicines you take and that have been prescribed to you. Take any new Medicines after you have completely understood and accept all the possible adverse reactions/side effects.   Please note  You were cared for by a hospitalist during  your hospital stay. If you have any questions about your discharge medications or the care you received while you were in the hospital after you are discharged, you can call the unit and asked to speak with the hospitalist on call if the hospitalist that took care of you is not available. Once you are discharged, your primary care physician will handle any further medical issues. Please note that NO REFILLS for any discharge medications will be authorized once you are discharged, as it is imperative that you return to your primary care physician (or establish a relationship with a primary care physician if you do not have one) for your aftercare needs so that they can reassess your need for medications and monitor your lab values.    Today   CHIEF COMPLAINT:   Chief Complaint  Patient presents with  . Anemia    HISTORY OF PRESENT ILLNESS:   32 y.o. female with a known history of multiple medical problems as below.  The patient presented to ED with above chief complaints.  She was diagnosed with PE last month and started Xarelto.  She has had heavy periods since taking Xarelto.  But she  denies any melena, bloody stool or hematuria.  He came to ED due to worsening generalized weakness and mild shortness of breath.  She denies any chest pain or palpitation or leg swelling.  Hemoglobin decreased from 10.52 weeks ago to 6.5 today.  Dr. Roxan Hockey ordered 2 unit PRBC transfusion and request admission.  VITAL SIGNS:  Blood pressure (!) 97/50, pulse 82, temperature 98.4 F (36.9 C), temperature source Oral, resp. rate 18, height 5\' 9"  (1.753 m), weight (!) 167 kg, last menstrual period 10/04/2018, SpO2 98 %.  I/O:    Intake/Output Summary (Last 24 hours) at 10/13/2018 1245 Last data filed at 10/13/2018 1001 Gross per 24 hour  Intake 300 ml  Output 50 ml  Net 250 ml    PHYSICAL EXAMINATION:  GENERAL:  32 y.o.-year-old patient lying in the bed with no acute distress.  EYES: Pupils equal, round, reactive to light and accommodation. No scleral icterus. Extraocular muscles intact.  HEENT: Head atraumatic, normocephalic. Oropharynx and nasopharynx clear.  NECK:  Supple, no jugular venous distention. No thyroid enlargement, no tenderness.  LUNGS: Normal breath sounds bilaterally, no wheezing, rales,rhonchi or crepitation. No use of accessory muscles of respiration.  CARDIOVASCULAR: S1, S2 normal. No murmurs, rubs, or gallops.  ABDOMEN: Soft, non-tender, non-distended. Bowel sounds present. No organomegaly or mass.  EXTREMITIES: No pedal edema, cyanosis, or clubbing.  NEUROLOGIC: Cranial nerves II through XII are intact. Muscle strength 5/5 in all extremities. Sensation intact. Gait not checked.  PSYCHIATRIC: The patient is alert and oriented x 3.  SKIN: No obvious rash, lesion, or ulcer.   DATA REVIEW:   CBC Recent Labs  Lab 10/12/18 0606 10/12/18 1343  WBC 4.8  --   HGB 7.5* 9.3*  HCT 24.5* 29.5*  PLT 241  --     Chemistries  Recent Labs  Lab 10/11/18 1252 10/12/18 0606 10/13/18 1142  NA 140 139  --   K 4.0 3.4*  --   CL 108 106  --   CO2 26 24  --   GLUCOSE 94  119*  --   BUN 14 16  --   CREATININE 1.07* 0.87  --   CALCIUM 9.3 8.7*  --   AST 15  --   --   ALT 8  --   --   ALKPHOS 65  --   --  BILITOT 0.3  --  0.4    Cardiac Enzymes No results for input(s): TROPONINI in the last 168 hours.  Microbiology Results  Results for orders placed or performed during the hospital encounter of 08/09/18  Urine Culture     Status: Abnormal   Collection Time: 08/09/18  7:02 AM  Result Value Ref Range Status   Specimen Description   Final    URINE, RANDOM Performed at [facility], [address] [place], [place] [address]    Special Requests   Final    NONE Performed at [facility], [address] [place], [place] [address]    Culture >=100,000 COLONIES/mL KLEBSIELLA PNEUMONIAE (A)  Final   Report Status 08/11/2018 FINAL  Final   Organism ID, Bacteria KLEBSIELLA PNEUMONIAE (A)  Final      Susceptibility   Klebsiella pneumoniae - MIC*    AMPICILLIN >=32 RESISTANT Resistant     CEFAZOLIN <=4 SENSITIVE Sensitive     CEFTRIAXONE <=1 SENSITIVE Sensitive     CIPROFLOXACIN <=0.25 SENSITIVE Sensitive     GENTAMICIN <=1 SENSITIVE Sensitive     IMIPENEM <=0.25 SENSITIVE Sensitive     NITROFURANTOIN 64 INTERMEDIATE Intermediate     TRIMETH/SULFA <=20 SENSITIVE Sensitive     AMPICILLIN/SULBACTAM >=32 RESISTANT Resistant     PIP/TAZO 64 INTERMEDIATE Intermediate     Extended ESBL NEGATIVE Sensitive     * >=100,000 COLONIES/mL KLEBSIELLA PNEUMONIAE    RADIOLOGY:  No results found.  EKG:   Orders placed or performed during the hospital encounter of 09/24/18  . ED EKG within 10 minutes  . ED EKG within 10 minutes  . EKG      Management plans discussed with the patient, family and they are in agreement.  CODE STATUS:     Code Status Orders  (From admission, onward)         Start     Ordered   10/11/18 1859  Full code  Continuous     10/11/18 1859        Code Status History    Date Active Date Inactive Code  Status Order ID Comments User Context   09/15/2018 1952 09/16/2018 2111 Full Code [id number]  Shon Hale, MD Inpatient   04/14/2017 1933 04/16/2017 1444 Full Code [id number]  Auburn Bilberry, MD Inpatient      TOTAL TIME TAKING CARE OF THIS PATIENT: 40 minutes.    Evelena Asa Eugune Sine M.D on 10/13/2018 at 12:45 PM  Between 7am to 6pm - Pager - [phone]  After 6pm go to [email or web address] - Social research officer, government  [facility]  Office  [phone]  CC: Primary care physician; Emogene Morgan, MD   Note: This dictation was prepared with Dragon dictation along with smaller phrase technology. Any transcriptional errors that result from this process are unintentional.

## 2018-10-13 NOTE — Progress Notes (Signed)
[facility] Hematology/Oncology Progress Note  Date of admission: 10/11/2018  Hospital day:  10/13/2018  Chief Complaint: Andrea Miller is a 32 y.o. female with a recent history of bilateral pulmonary emboli on Xarelto who was admitted through the emergency room with symptomatic anemia.  Subjective:  Feels much better.  No chest pain or shortness of breath.  No hematuria, vaginal bleeding, melena or hematochezia.  Social History: The patient is alone today.  Allergies:  Allergies  Allergen Reactions  . Cortizone-10 [Hydrocortisone] Shortness Of Breath and Swelling  . Garlic Anaphylaxis  . Prednisone Swelling    Tongue swelling  . Corticosteroids Palpitations    Scheduled Medications: . calcium carbonate  1 tablet Oral BID  . clonazePAM  2 mg Oral TID  . diltiazem  360 mg Oral Daily  . escitalopram  20 mg Oral Daily  . ferrous sulfate  325 mg Oral BID WC  . folic acid  1 mg Oral Daily  . furosemide  40 mg Oral BID  . lamoTRIgine  200 mg Oral BID  . linaclotide  290 mcg Oral QAC breakfast  . mometasone-formoterol  2 puff Inhalation BID  . orlistat  120 mg Oral TID WC  . pantoprazole  40 mg Oral Daily  . potassium chloride SA  20 mEq Oral BID  . pregabalin  50 mg Oral BID  . prenatal vitamin w/FE, FA  1 tablet Oral Q1200  . propranolol  80 mg Oral TID  . risperiDONE  3 mg Oral Daily  . rosuvastatin  20 mg Oral Daily  . sodium chloride flush  3 mL Intravenous Q12H  . tiZANidine  4 mg Oral TID  . topiramate  100 mg Oral BID  . vitamin C  250 mg Oral BID    Review of Systems: GENERAL:  Feels better.  No fevers, sweats or weight loss. PERFORMANCE STATUS (ECOG):  1 HEENT:  No visual changes, runny nose, sore throat, mouth sores or tenderness. Lungs: No shortness of breath.  No cough.  No hemoptysis. Cardiac:  No chest pain, palpitations, orthopnea, or PND. h/o chronic tachycardia. GI:  No nausea, vomiting, diarrhea, constipation, melena or  hematochezia. GU:  No urgency, frequency, dysuria, or hematuria. h/o heavy menses (next menses due between 11/18 -11/20). Musculoskeletal:  Bulging disk.  No joint pain.  No muscle tenderness. Extremities:  No pain or swelling. Skin:  No rashes or skin changes. Neuro:  No headache, numbness or weakness, balance or coordination issues. h/o chronic headaches. Endocrine:  No diabetes, thyroid issues, hot flashes or night sweats. Psych:  No mood changes, depression or anxiety. Pain:  No focal pain. Review of systems:  All other systems reviewed and found to be negative.  Physical Exam: Blood pressure (!) 97/50, pulse 82, temperature 98.4 F (36.9 C), temperature source Oral, resp. rate 18, height '5\' 9"'$  (1.753 m), weight (!) 368 lb 2.7 oz (167 kg), last menstrual period 10/04/2018, SpO2 98 %.  GENERAL:  Well developed, well nourished, heavyset woman sitting comfortably on the medical unit in no acute distress. MENTAL STATUS:  Alert and oriented to person, place and time. HEAD:  Dark hair.  Normocephalic, atraumatic, face symmetric, no Cushingoid features. EYES:  Brown eyes.  Pupils equal round.  No conjunctivitis or scleral icterus. ENT:  Oropharynx clear without lesion.  Tongue normal. Mucous membranes moist.  RESPIRATORY:  Clear to auscultation without rales, wheezes or rhonchi. CARDIOVASCULAR:  Regular rate and rhythm without murmur, rub or gallop. ABDOMEN:  Soft,  non-tender, with active bowel sounds, and no hepatosplenomegaly.  No masses. SKIN:  No rashes, ulcers or lesions. EXTREMITIES: No edema, no skin discoloration or tenderness.  NEUROLOGICAL: Unremarkable. PSYCH:  Appropriate.   Results for orders placed or performed during the hospital encounter of 10/11/18 (from the past 48 hour(s))  Type and screen [place]     Status: None   Collection Time: 10/11/18  3:46 PM  Result Value Ref Range   ABO/RH(D) O POS    Antibody Screen NEG    Sample Expiration  10/14/2018    Unit Number [id number]    Blood Component Type RBC LR PHER2    Unit division 00    Status of Unit ISSUED,FINAL    Transfusion Status OK TO TRANSFUSE    Crossmatch Result Compatible    Unit Number [id number]    Blood Component Type RBC LR PHER1    Unit division 00    Status of Unit ISSUED,FINAL    Transfusion Status OK TO TRANSFUSE    Crossmatch Result Compatible    Unit Number [id number]    Blood Component Type RED CELLS,LR    Unit division 00    Status of Unit ISSUED,FINAL    Transfusion Status OK TO TRANSFUSE    Crossmatch Result      Compatible Performed at [facility], [address] [place], [place] [address]   Prepare RBC     Status: None   Collection Time: 10/11/18  4:10 PM  Result Value Ref Range   Order Confirmation      ORDER PROCESSED BY BLOOD BANK Performed at [facility], [address], [place], [place] [address]   ABO/Rh     Status: None   Collection Time: 10/11/18  6:15 PM  Result Value Ref Range   ABO/RH(D)      O POS Performed at [facility], [address] [place], [place] [address]   Basic metabolic panel     Status: Abnormal   Collection Time: 10/12/18  6:06 AM  Result Value Ref Range   Sodium 139 135 - 145 mmol/L   Potassium 3.4 (L) 3.5 - 5.1 mmol/L   Chloride 106 98 - 111 mmol/L   CO2 24 22 - 32 mmol/L   Glucose, Bld 119 (H) 70 - 99 mg/dL   BUN 16 6 - 20 mg/dL   Creatinine, Ser 0.87 0.44 - 1.00 mg/dL   Calcium 8.7 (L) 8.9 - 10.3 mg/dL   GFR calc non Af Amer >60 >60 mL/min   GFR calc Af Amer >60 >60 mL/min    Comment: (NOTE) The eGFR has been calculated using the CKD EPI equation. This calculation has not been validated in all clinical situations. eGFR's persistently <60 mL/min signify possible Chronic Kidney Disease.    Anion gap 9 5 - 15    Comment: Performed at [facility], [address] [place],  [place] [address]  CBC     Status: Abnormal   Collection Time:  10/12/18  6:06 AM  Result Value Ref Range   WBC 4.8 4.0 - 10.5 K/uL   RBC 2.94 (L) 3.87 - 5.11 MIL/uL   Hemoglobin 7.5 (L) 12.0 - 15.0 g/dL   HCT 24.5 (L) 36.0 - 46.0 %   MCV 83.3 80.0 - 100.0 fL   MCH 25.5 (L) 26.0 - 34.0 pg   MCHC 30.6 30.0 - 36.0 g/dL   RDW 15.6 (H) 11.5 - 15.5 %   Platelets 241 150 - 400  K/uL   nRBC 0.6 (H) 0.0 - 0.2 %    Comment: Performed at [facility], [address] [place], [place] [address]  Prepare RBC     Status: None   Collection Time: 10/12/18  7:17 AM  Result Value Ref Range   Order Confirmation      ORDER PROCESSED BY BLOOD BANK Performed at [facility], [address] [place], [place] [address]   Hemoglobin and hematocrit, blood     Status: Abnormal   Collection Time: 10/12/18  1:43 PM  Result Value Ref Range   Hemoglobin 9.3 (L) 12.0 - 15.0 g/dL   HCT 29.5 (L) 36.0 - 46.0 %    Comment: Performed at [facility], [address] [place], [place] [address]  Urinalysis, Complete w Microscopic     Status: Abnormal   Collection Time: 10/12/18  6:08 PM  Result Value Ref Range   Color, Urine YELLOW (A) YELLOW   APPearance CLOUDY (A) CLEAR   Specific Gravity, Urine 1.024 1.005 - 1.030   pH 5.0 5.0 - 8.0   Glucose, UA NEGATIVE NEGATIVE mg/dL   Hgb urine dipstick MODERATE (A) NEGATIVE   Bilirubin Urine NEGATIVE NEGATIVE   Ketones, ur NEGATIVE NEGATIVE mg/dL   Protein, ur NEGATIVE NEGATIVE mg/dL   Nitrite NEGATIVE NEGATIVE   Leukocytes, UA NEGATIVE NEGATIVE   RBC / HPF 0-5 0 - 5 RBC/hpf   WBC, UA 6-10 0 - 5 WBC/hpf   Bacteria, UA RARE (A) NONE SEEN   Squamous Epithelial / LPF 11-20 0 - 5   Mucus PRESENT     Comment: Performed at [facility], [address] [place], [place] [address]  Occult blood card to lab, stool     Status: None   Collection Time: 10/12/18  9:57 PM  Result Value Ref Range   Fecal Occult Bld NEGATIVE NEGATIVE    Comment: Performed at [facility], [address]  [place], [place] [address]   No results found.  Assessment:  JOZELYN KUWAHARA is a 32 y.o. female with a recent history of small bilateral pulmonary emboli on Xarelto. Risk factors for thrombosis include obesity and birth control pills.    Chest CT angiogramon 09/15/2018 revealed small bilateral pulmonary emboli and mild cardiomegaly. Bilateral lower extremity duplexon 09/16/2018 revealed no evidence of lower extremity DVT.   She has iron deficiency anemia. She notes a recent history of extremely heavy menses x 3 weeks (none in past 2 weeks).  Hemoglobin was 10.5 on 09/24/2018 and 6.5 on 10/10/2018.  Work-up on 10/10/2018 revealed a ferritin of 4, iron saturation 3%.  She has ice pica.  She received 3 units of PRBCs.  She has B12 deficiency.  B12 was 242 on 10/10/2018.  Diet has been protein shakes and Weight Watcher's.  Symptomatically,she is feeling much better.  She denies any bleeding.  Hemoglobin is 9.3.  Plan:   1. Iron deficiency anemia:             Etiology of iron deficiency appears to be due to extremely heavy menses (none in 2 weeks).             Urinalysis reveals hemoglobinuria.  Check DAT and bilirubin post transfusion.             Guaiac stool x 2 were negative for blood.             Patient completed 3 units of PRBCs.  Hemoglobin 6.5 to 9.3.  Patient started on oral iron BID with vitamin C.  Preauth sent for IV iron in outpatient department if patient does not improve or is intolerant of oral iron. 2.  B12 deficiency:             Patient started on B12 injections.  Anticipate continuation of B12 in the outpatient department. 3.  Menorrhagia:             Discuss importance of GYN consult in the near future secondary to history of heavy menses.  Patient notes next menses to start between 11/18 - 11/20. 4.  Pulmonary embolism:  Patient has been off Xarelto with plans to restart today.   Discuss need to contact clinic immediately if any return of heavy  menstrual bleeding.  Discuss consideration of a temporary IVC filter if menstrual bleeding becomes an issue until it is corrected.    Lequita Asal, MD  10/13/2018, 11:27 AM

## 2018-10-14 LAB — CARDIOLIPIN ANTIBODIES, IGG, IGM, IGA
Anticardiolipin IgA: <9 APL U/mL (ref 0–11)
Anticardiolipin IgG: <9 GPL U/mL (ref 0–14)
Anticardiolipin IgM: <9 MPL U/mL (ref 0–12)

## 2018-10-14 LAB — DRVVT MIX: dRVVT Mix: 72.3 s — ABNORMAL HIGH (ref 0.0–47.0)

## 2018-10-14 LAB — LUPUS ANTICOAGULANT PANEL
DRVVT: 85.7 s — ABNORMAL HIGH (ref 0.0–47.0)
PTT Lupus Anticoagulant: 46.2 s (ref 0.0–51.9)

## 2018-10-14 LAB — DRVVT CONFIRM: dRVVT Confirm: 1.4 ratio — ABNORMAL HIGH (ref 0.8–1.2)

## 2018-10-15 ENCOUNTER — Encounter: Payer: Self-pay | Admitting: Internal Medicine

## 2018-10-15 ENCOUNTER — Inpatient Hospital Stay: Payer: BLUE CROSS/BLUE SHIELD

## 2018-10-15 ENCOUNTER — Ambulatory Visit: Payer: Self-pay | Admitting: Internal Medicine

## 2018-10-15 ENCOUNTER — Inpatient Hospital Stay (HOSPITAL_BASED_OUTPATIENT_CLINIC_OR_DEPARTMENT_OTHER): Payer: BLUE CROSS/BLUE SHIELD | Admitting: Hematology and Oncology

## 2018-10-15 ENCOUNTER — Ambulatory Visit (INDEPENDENT_AMBULATORY_CARE_PROVIDER_SITE_OTHER): Payer: BLUE CROSS/BLUE SHIELD | Admitting: Internal Medicine

## 2018-10-15 VITALS — BP 130/84 | HR 85 | Temp 98.4°F | Resp 18 | Wt 368.1 lb

## 2018-10-15 VITALS — BP 120/84 | HR 90 | Resp 16 | Ht 69.0 in | Wt 370.0 lb

## 2018-10-15 DIAGNOSIS — Z86711 Personal history of pulmonary embolism: Secondary | ICD-10-CM

## 2018-10-15 DIAGNOSIS — E669 Obesity, unspecified: Secondary | ICD-10-CM | POA: Diagnosis not present

## 2018-10-15 DIAGNOSIS — D649 Anemia, unspecified: Secondary | ICD-10-CM | POA: Diagnosis not present

## 2018-10-15 DIAGNOSIS — J4530 Mild persistent asthma, uncomplicated: Secondary | ICD-10-CM

## 2018-10-15 DIAGNOSIS — R42 Dizziness and giddiness: Secondary | ICD-10-CM | POA: Diagnosis not present

## 2018-10-15 DIAGNOSIS — R000 Tachycardia, unspecified: Secondary | ICD-10-CM | POA: Diagnosis not present

## 2018-10-15 DIAGNOSIS — I2694 Multiple subsegmental pulmonary emboli without acute cor pulmonale: Secondary | ICD-10-CM

## 2018-10-15 DIAGNOSIS — R0609 Other forms of dyspnea: Secondary | ICD-10-CM | POA: Diagnosis not present

## 2018-10-15 DIAGNOSIS — E538 Deficiency of other specified B group vitamins: Secondary | ICD-10-CM | POA: Diagnosis not present

## 2018-10-15 DIAGNOSIS — Z8249 Family history of ischemic heart disease and other diseases of the circulatory system: Secondary | ICD-10-CM | POA: Diagnosis not present

## 2018-10-15 DIAGNOSIS — Z79899 Other long term (current) drug therapy: Secondary | ICD-10-CM | POA: Diagnosis not present

## 2018-10-15 DIAGNOSIS — Z7901 Long term (current) use of anticoagulants: Secondary | ICD-10-CM | POA: Diagnosis not present

## 2018-10-15 DIAGNOSIS — D500 Iron deficiency anemia secondary to blood loss (chronic): Secondary | ICD-10-CM

## 2018-10-15 DIAGNOSIS — I2699 Other pulmonary embolism without acute cor pulmonale: Secondary | ICD-10-CM | POA: Diagnosis present

## 2018-10-15 DIAGNOSIS — Z791 Long term (current) use of non-steroidal anti-inflammatories (NSAID): Secondary | ICD-10-CM | POA: Diagnosis not present

## 2018-10-15 DIAGNOSIS — N920 Excessive and frequent menstruation with regular cycle: Secondary | ICD-10-CM | POA: Diagnosis not present

## 2018-10-15 DIAGNOSIS — R0600 Dyspnea, unspecified: Secondary | ICD-10-CM

## 2018-10-15 DIAGNOSIS — D509 Iron deficiency anemia, unspecified: Secondary | ICD-10-CM | POA: Insufficient documentation

## 2018-10-15 MED ORDER — MONTELUKAST SODIUM 10 MG PO TABS: 10.0000 mg | ORAL_TABLET | Freq: Every day | ORAL | 5 refills | Status: DC

## 2018-10-15 MED ORDER — BUDESONIDE-FORMOTEROL FUMARATE 160-4.5 MCG/ACT IN AERO: 2.0000 | INHALATION_SPRAY | Freq: Two times a day (BID) | RESPIRATORY_TRACT | 10 refills | Status: DC

## 2018-10-15 NOTE — Patient Instructions (Addendum)
Continue symbicort, albuterol, nebs.  Continue blood thinner.

## 2018-10-15 NOTE — Addendum Note (Signed)
Addended by: Joana Reamer K on: 10/15/2018 05:00 PM   Modules accepted: Orders

## 2018-10-15 NOTE — Progress Notes (Signed)
PULMONARY OFFICE FOLLOW UP NOTE  Requesting MD/Service: self Date of initial consultation: 07/23/17 Reason for consultation: asthma  PT PROFILE: 32 y.o. female never smoker with multiple medical problems including asthma, OSA and morbid obesity referred after hospitalization for asthma exacerbation   DATA: 03/20/17 CT chest: low volumes, mild atelectasis, no infiltrates or edema 04/15/17 Echocardiogram: normal LVEF, no RV or RA dilation. RVSP not estimated 08/30/17 PFTs: Poor quality test (poor reproducibility) with no definite obstruction on spirometry. However, there was significant improvement in both FEV1 and FVC after bronchodilator therapy. Lung volumes consistent with moderate to severe restriction. She could not perform the DLCO maneuver 06/22/18 CTA chest: Slightly suboptimal opacification of the pulmonary arterial system which limits the study. No definite acute central PE is seen. Low lung volumes.  No acute pulmonary infiltrate  INTERVAL: The patient is a 32 year old female, she usually sees Dr. Sung Amabile, she is followed for asthma, OSA.  She was recently hospitalized for acute pulmonary embolism.  She is seen hematology earlier today  SUBJ: Patient presented to the ED on 09/15/2018 with chest pain CT chest showed pulmonary embolism and had lab work done, lupus anticoagulant panel was positive (on Xarelto).  She has been noted to have chronic anemia with heavy menses, and was previously on birth control pills. She was again admitted on 11/15 for 2 days with anemia and she had blood transfused, during this time she was off xarelto.   Currently she is taking xarelto 20 mg once daily, she is not missing any doses (other than being in the hospital).  She feels that her breathing is "off", she is taking symbicort 2 puffs twice daily, but is not rinsing mouth, though she ran out about a month ago. She takes proventil about 3-4 times per day. She takes singulair at night but ran out 2  months ago but was unable to get a refill. She also does a nebulizer twice daily and feels that it helps.   She was going through eval for bariatric surgery and was being planned for surgery but then she had the blood clot in October. She was recently diagnosed with OSA, and is waiting for her CPAP.   **CT chest 09/24/2018>>images personally reviewed, images are of poor quality due to artifact and obesity.  Lung volumes are reduced, there is mild cardiac enlargement, the right diaphragm is significantly elevated.  Pulmonary arteries are enlarged suggestive of pulmonary hypertension, no evidence of pulmonary embolism.  Review of Systems:  Constitutional: Feels well. Cardiovascular: Denies chest pain, exertional chest pain.  Pulmonary: Denies hemoptysis, pleuritic chest pain.   The remainder of systems were reviewed and were found to be negative other than what is documented in the HPI.      Vitals:   10/15/18 1622 10/15/18 1623  BP:  120/84  Pulse:  90  Resp: 16   SpO2:  98%  Weight: (!) 370 lb (167.8 kg)   Height: 5\' 9"  (1.753 m)   RA   EXAM:  Gen: Mildly lethargic, extremely obese, no overt respiratory distress HEENT: NCAT, sclera white Neck: No JVD Lungs: breath sounds distant without wheezes or other adventitious sounds Cardiovascular: RRR, no murmurs Abdomen: Soft, nontender, normal BS Ext: without clubbing, cyanosis, edema Neuro: grossly intact Skin: Limited exam, no lesions noted   DATA:   BMP Latest Ref Rng & Units 10/12/2018 10/11/2018 09/24/2018  Glucose 70 - 99 mg/dL 161(W) 94 960(A)  BUN 6 - 20 mg/dL 16 14 7   Creatinine 0.44 - 1.00 mg/dL  0.87 1.07(H) 0.87  Sodium 135 - 145 mmol/L 139 140 144  Potassium 3.5 - 5.1 mmol/L 3.4(L) 4.0 3.3(L)  Chloride 98 - 111 mmol/L 106 108 106  CO2 22 - 32 mmol/L 24 26 30   Calcium 8.9 - 10.3 mg/dL 7.5(T) 9.3 9.2    CBC Latest Ref Rng & Units 10/12/2018 10/12/2018 10/11/2018  WBC 4.0 - 10.5 K/uL - 4.8 5.1  Hemoglobin 12.0 -  15.0 g/dL 7.0(Y) 7.5(L) 6.5(L)  Hematocrit 36.0 - 46.0 % 29.5(L) 24.5(L) 22.3(L)  Platelets 150 - 400 K/uL - 241 304    CXR 06/29/2018: Low lung volumes.  No acute findings    IMPRESSION:    Asthma. Pulmonary embolism. Obesity with restrictive lung disease. Obstructive sleep apnea.  Since obesity is really her major medical problem, I would support the decision to undergo bariatric surgery.  However, due to her severe obesity, she will have significantly increased risk of perioperative complications (as do most patients undergoing bariatric surgery).  I do not think her asthma is a major risk factor for perioperative complications.  At the present time, her asthma is optimized.  PLAN:  Symbicort, montelukast, albuterol, nebulized albuterol were refilled. She is asked to continue Xarelto. Continue weight loss efforts, once the patient is off Xarelto, she can be reconsidered for bariatric surgery. Patient was recently diagnosed with sleep apnea and is waiting for her CPAP, she is going through [facility].   Follow-up in 4 months with Dr. Sung Amabile.  Wells Guiles, M.D., F.C.C.P.  Board Certified in Internal Medicine, Pulmonary Medicine, Critical Care Medicine, and Sleep Medicine.  [facility] Office Number: [phone]  Greater than 50% of the 40 minute visit was spent in counseling/coordination of care regarding management of her PE, obesity, pre-op clearance, asthma, OSA.

## 2018-10-15 NOTE — Progress Notes (Signed)
[place] [facility] day:  10/15/2018  Chief Complaint: Andrea Miller is a 32 y.o. female with pulmonary embolism, iron deficiency anemia secondary to menorrhagia, B12 deficiency who is seen for assessment after interval hospitalization.  HPI:  The patient was last seen in the hematology clinic on 10/11/2018 for initial consultation. She described "good and bad days".  She had chronic issues with tachycardia.  She noted vertigo with rapid change in position.  Exam was unremarkable.  Work-up revealed the following normal studies:  Lupus anticoagulant panel was positive (on Xarelto).  Anti-cardiolipin antibodies and beta-2 glycoprotein was negative.  Factor V Leiden and prothrombin gene mutation are pending.  CBC surprising revealed a hematocrit of 22.3, hemoglobin 6.5, MCV 83.5, platelets 304,000, and WBC 5100.  Hemoglobin was 13.2 on 09/15/2018, 11.8 on 09/16/2018, and 10.5 on 09/24/2018.  Patient was contacted and directed to the emergency room for evaluation and likely admission.  Ferritin was 4 with an iron saturation of 3% and a TIBC of 409.  Folate was 8.1.  B12 was 242.  TSH 0.515.  Creatinine was 1.07.  LFTs were normal.  She was admitted to [facility] from 10/11/2018 - 10/13/2018.  She notes a history of heavy menses since discontinuation of her birth control pills.  She describes 3 weeks of heavy menses, but no menses x 2 weeks.  Menses was "so heavy" that she "wore a diaper and 2-3 pads at a time".  She denied any hematuria, melena or hematochezia.  Urine revealed moderate hemoglobin.  Coombs was negative.  Xarelto was temporarily held.  She received 3 units of PRBCs.  She decided to try oral iron.  She began B12 injections (first on 10/13/2018).    During the interim, patient notes that she is fatigued and exertional short of breath today. She notes that she initially felt well following her blood transfusions. She is about to start her menstrual cycle  again, and there is significant concern related to menorrhagia. Patient is scheduled to see GYN today at [facility] for further evaluation and treatment. Patient taking oral iron, however has decided that she would like to receive IV in order to expedite her treatment and start feeling better sooner.    Past Medical History:  Diagnosis Date  . Anxiety   . Asthma   . Bipolar disorder ([place])   . Chest pain    and angina  . CHF (congestive heart failure) ([place])   . Depression   . Fibromyalgia   . Hypertension   . 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 Obesity   . Pre-diabetes   . Seizures ([place])    secondary to anxiety  last seizure 01/25/18  . SVT (supraventricular tachycardia) ([facility])    with hx of syncope; managed by [facility]    Past Surgical History:  Procedure Laterality Date  . CHOLECYSTECTOMY N/A 02/22/2018   Procedure: LAPAROSCOPIC CHOLECYSTECTOMY;  Surgeon: Vickie Epley, MD;  Location: [facility] ORS;  Service: General;  Laterality: N/A;  . TONSILLECTOMY AND ADENOIDECTOMY N/A 09/05/2016   Procedure: TONSILLECTOMY AND ADENOIDECTOMY;  Surgeon: Beverly Gust, MD;  Location: [facility] ORS;  Service: ENT;  Laterality: N/A;    Family History  Problem Relation Age of Onset  . Diabetes Mother   . Hypertension Mother   . Asthma Mother   . Gallstones Mother   . Diabetes Father   . Hypertension Father   . Gallstones Father   .  Bipolar disorder Sister   . COPD Brother   . Schizophrenia Brother   . COPD Brother   . Hypertension Brother     Social History:  reports that she has never smoked. She has never used smokeless tobacco. She reports that she does not drink alcohol or use drugs.  No tobacco, tobacco, or illegal subatance use. Prior to her back issues, patient was employed full time at [employer]. The patient is alone today.  Allergies:  Allergies  Allergen Reactions  . Cortizone-10 [Hydrocortisone] Shortness Of Breath and Swelling   . Garlic Anaphylaxis  . Prednisone Swelling    Tongue swelling  . Corticosteroids Palpitations    Current Medications: Current Outpatient Medications  Medication Sig Dispense Refill  . albuterol (PROVENTIL HFA;VENTOLIN HFA) 108 (90 Base) MCG/ACT inhaler Inhale 2 puffs into the lungs every 6 (six) hours as needed for wheezing or shortness of breath. 1 Inhaler 10  . albuterol (PROVENTIL) (2.5 MG/3ML) 0.083% nebulizer solution Take 2.5 mg by nebulization every 4 (four) hours as needed for wheezing or shortness of breath.    . budesonide-formoterol (SYMBICORT) 160-4.5 MCG/ACT inhaler Inhale 2 puffs into the lungs 2 (two) times daily. (Patient taking differently: Inhale 1 puff into the lungs 2 (two) times daily. ) 1 Inhaler 10  . Calcium-Magnesium 500-250 MG TABS Take 1 tablet by mouth 2 (two) times daily.     . clonazePAM (KLONOPIN) 2 MG tablet Take 2 mg by mouth 3 (three) times daily.     Marland Kitchen DEXILANT 30 MG capsule Take 1 capsule by mouth daily.  5  . escitalopram (LEXAPRO) 20 MG tablet Take 20 mg by mouth daily.    . ferrous sulfate 325 (65 FE) MG tablet Take 1 tablet (325 mg total) by mouth 2 (two) times daily with a meal. 120 tablet 0  . fluticasone (FLONASE) 50 MCG/ACT nasal spray Place 2 sprays into both nostrils daily as needed for allergies.     . folic acid (FOLVITE) 1 MG tablet Take 1 mg by mouth daily.   10  . furosemide (LASIX) 20 MG tablet Take 40 mg by mouth 2 (two) times daily.     Marland Kitchen ketoconazole (NIZORAL) 2 % shampoo Apply 1 application topically every 30 (thirty) days.   1  . lactulose (CHRONULAC) 10 GM/15ML solution Take 30 g by mouth daily as needed for mild constipation.   5  . lamoTRIgine (LAMICTAL) 100 MG tablet Take 200 mg by mouth 2 (two) times daily.     Marland Kitchen linaclotide (LINZESS) 290 MCG CAPS capsule Take 290 mcg by mouth daily before breakfast.    . mupirocin ointment (BACTROBAN) 2 % Apply 1 application topically 2 (two) times daily.   0  . orlistat (XENICAL) 120 MG  capsule Take 120 mg by mouth 3 (three) times daily with meals.    . Potassium Chloride ER 20 MEQ TBCR Take 20 mEq by mouth 2 (two) times daily.     . pregabalin (LYRICA) 50 MG capsule Take 1 capsule (50 mg total) by mouth 2 (two) times daily. 60 capsule 0  . Prenatal Vit-Fe Fumarate-FA (MULTIVITAMIN-PRENATAL) 27-0.8 MG TABS tablet Take 1 tablet by mouth daily at 12 noon.    . promethazine (PHENERGAN) 12.5 MG tablet Take 2 tablets (25 mg total) by mouth every 6 (six) hours as needed for nausea or vomiting. 20 tablet 0  . propranolol (INDERAL) 40 MG tablet Take 2 tablets (80 mg total) by mouth 3 (three) times daily. 90 tablet 0  .  risperiDONE (RISPERDAL) 3 MG tablet Take 3 mg by mouth daily.     . Rivaroxaban 15 & 20 MG TBPK Take as directed on package: Start with one 44m tablet by mouth twice a day with food. On Day 22, switch to one 211mtablet once a day with food. 51 each 0  . rosuvastatin (CRESTOR) 20 MG tablet Take 20 mg by mouth daily.    . Marland KitcheniZANidine (ZANAFLEX) 4 MG tablet Take 4 mg by mouth 3 (three) times daily.   1  . Topiramate ER (TROKENDI XR) 200 MG CP24 Take 1 capsule by mouth daily.     . vitamin B-12 (CYANOCOBALAMIN) 1000 MCG tablet Take 1 tablet (1,000 mcg total) by mouth daily. 90 tablet 0  . vitamin C (VITAMIN C) 250 MG tablet Take 1 tablet (250 mg total) by mouth 2 (two) times daily. 60 tablet 0   No current facility-administered medications for this visit.     Review of Systems:  GENERAL:  Fatigue.  No fevers, sweats.  Intentional weight loss. PERFORMANCE STATUS (ECOG):  1-2 HEENT:  Blurred vision.  No runny nose, sore throat, mouth sores or tenderness. Lungs: Little shortness of breath.  No cough.  No hemoptysis. Cardiac:  Chronic heart issues.  No orthopnea, or PND. GI:  No nausea, vomiting, diarrhea, constipation, melena or hematochezia. GU:  Chronic urgency and frequency.  No dysuria, or hematuria. Musculoskeletal:  Bulging disk.  No joint pain.  No muscle  tenderness. Extremities:  No pain or swelling. Skin:  Thickened skin.  No rashes or skin changes. Neuro:  Headaches.  Vertigo with changes in position (little).  No numbness or weakness, balance or coordination issues. Endocrine:  No diabetes, thyroid issues, hot flashes or night sweats. Psych:  No mood changes, depression or anxiety. Pain:  No focal pain. Review of systems:  All other systems reviewed and found to be negative.   Physical Exam: Blood pressure 130/84, pulse 85, temperature 98.4 F (36.9 C), temperature source Tympanic, resp. rate 18, weight (!) 368 lb 2 oz (167 kg), last menstrual period 10/04/2018, SpO2 100 %. GENERAL:  Well developed, well nourished, heavyset woman sitting comfortably in the exam room in no acute distress. MENTAL STATUS:  Alert and oriented to person, place and time. HEAD:  Dark hair.  Normocephalic, atraumatic, face symmetric, no Cushingoid features. EYES:  Glasses.  Brown eyes.  Pupils equal round and reactive to light and accomodation.  No conjunctivitis or scleral icterus. ENT:  Oropharynx clear without lesion.  Tongue normal. Mucous membranes moist.  RESPIRATORY:  Clear to auscultation without rales, wheezes or rhonchi. CARDIOVASCULAR:  Regular rate and rhythm without murmur, rub or gallop. ABDOMEN:  Fully round.  Soft, non-tender, with active bowel sounds, and no appreciable hepatosplenomegaly.  No masses. SKIN:  No rashes, ulcers or lesions. EXTREMITIES: No edema, no skin discoloration or tenderness.  No palpable cords. LYMPH NODES: No palpable cervical, supraclavicular, axillary or inguinal adenopathy  NEUROLOGICAL: Unremarkable. PSYCH:  Appropriate.    Admission on 10/11/2018, Discharged on 10/13/2018  Component Date Value Ref Range Status  . Order Confirmation 10/11/2018    Final                   Value:ORDER PROCESSED BY BLOOD BANK [facility] [place] [id number] . ABO/RH(D) 10/11/2018 O  POS   Final  . Antibody Screen 10/11/2018 NEG   Final  . Sample Expiration 10/11/2018 10/14/2018   Final  .  Unit Number 10/11/2018 [id number]   Final  . Blood Component Type 10/11/2018 RBC LR PHER2   Final  . Unit division 10/11/2018 00   Final  . Status of Unit 10/11/2018 ISSUED,FINAL   Final  . Transfusion Status 10/11/2018 OK TO TRANSFUSE   Final  . Crossmatch Result 10/11/2018 Compatible   Final  . Unit Number 10/11/2018 [id number]   Final  . Blood Component Type 10/11/2018 RBC LR PHER1   Final  . Unit division 10/11/2018 00   Final  . Status of Unit 10/11/2018 ISSUED,FINAL   Final  . Transfusion Status 10/11/2018 OK TO TRANSFUSE   Final  . Crossmatch Result 10/11/2018 Compatible   Final  . Unit Number 10/11/2018 [id number]   Final  . Blood Component Type 10/11/2018 RED CELLS,LR   Final  . Unit division 10/11/2018 00   Final  . Status of Unit 10/11/2018 ISSUED,FINAL   Final  . Transfusion Status 10/11/2018 OK TO TRANSFUSE   Final  . Crossmatch Result 10/11/2018    Final                   Value:Compatible Performed at [facility], [address], [place], [place] [address]   . ABO/RH(D) 10/11/2018    Final                   Value:O POS Performed at [facility], [address], [place], [place] [address]   . Sodium 10/12/2018 139  135 - 145 mmol/L Final  . Potassium 10/12/2018 3.4* 3.5 - 5.1 mmol/L Final  . Chloride 10/12/2018 106  98 - 111 mmol/L Final  . CO2 10/12/2018 24  22 - 32 mmol/L Final  . Glucose, Bld 10/12/2018 119* 70 - 99 mg/dL Final  . BUN 10/12/2018 16  6 - 20 mg/dL Final  . Creatinine, Ser 10/12/2018 0.87  0.44 - 1.00 mg/dL Final  . Calcium 10/12/2018 8.7* 8.9 - 10.3 mg/dL Final  . GFR calc non Af Amer 10/12/2018 >60  >60 mL/min Final  . GFR calc Af Amer 10/12/2018 >60  >60 mL/min Final   Comment: (NOTE) The eGFR has been calculated using the CKD EPI equation. This calculation has not been validated in all clinical  situations. eGFR's persistently <60 mL/min signify possible Chronic Kidney Disease.   Georgiann Hahn gap 10/12/2018 9  5 - 15 Final   Performed at [facility], [address] [place], [place] [address]  . WBC 10/12/2018 4.8  4.0 - 10.5 K/uL Final  . RBC 10/12/2018 2.94* 3.87 - 5.11 MIL/uL Final  . Hemoglobin 10/12/2018 7.5* 12.0 - 15.0 g/dL Final  . HCT 10/12/2018 24.5* 36.0 - 46.0 % Final  . MCV 10/12/2018 83.3  80.0 - 100.0 fL Final  . MCH 10/12/2018 25.5* 26.0 - 34.0 pg Final  . MCHC 10/12/2018 30.6  30.0 - 36.0 g/dL Final  . RDW 10/12/2018 15.6* 11.5 - 15.5 % Final  . Platelets 10/12/2018 241  150 - 400 K/uL Final  . nRBC 10/12/2018 0.6* 0.0 - 0.2 % Final   Performed at [facility], [address], [place], [place] [address]  . ISSUE DATE / TIME 10/11/2018 [id number]   Final  . Blood Product Unit Number 10/11/2018 [id number]   Final  . PRODUCT CODE 10/11/2018 [id number]   Final  . Unit Type and Rh 10/11/2018 5100   Final  . Blood Product Expiration Date 10/11/2018 [id number]   Final  . ISSUE DATE / TIME 10/11/2018  [id number]   Final  . Blood Product Unit Number 10/11/2018 [id number]   Final  . PRODUCT CODE 10/11/2018 [id number]   Final  . Unit Type and Rh 10/11/2018 5100   Final  . Blood Product Expiration Date 10/11/2018 [id number]   Final  . ISSUE DATE / TIME 10/11/2018 [id number]   Final  . Blood Product Unit Number 10/11/2018 [id number]   Final  . PRODUCT CODE 10/11/2018 [id number]   Final  . Unit Type and Rh 10/11/2018 5100   Final  . Blood Product Expiration Date 10/11/2018 [id number]   Final  . Order Confirmation 10/12/2018    Final                   Value:ORDER PROCESSED BY BLOOD BANK Performed at [facility], [address], [place], [place] [address]   . Hemoglobin 10/12/2018 9.3* 12.0 - 15.0 g/dL Final  . HCT 10/12/2018 29.5* 36.0 - 46.0 % Final   Performed at [facility], [address] [place], [place]  [address]  . Color, Urine 10/12/2018 YELLOW* YELLOW Final  . APPearance 10/12/2018 CLOUDY* CLEAR Final  . Specific Gravity, Urine 10/12/2018 1.024  1.005 - 1.030 Final  . pH 10/12/2018 5.0  5.0 - 8.0 Final  . Glucose, UA 10/12/2018 NEGATIVE  NEGATIVE mg/dL Final  . Hgb urine dipstick 10/12/2018 MODERATE* NEGATIVE Final  . Bilirubin Urine 10/12/2018 NEGATIVE  NEGATIVE Final  . Ketones, ur 10/12/2018 NEGATIVE  NEGATIVE mg/dL Final  . Protein, ur 10/12/2018 NEGATIVE  NEGATIVE mg/dL Final  . Nitrite 10/12/2018 NEGATIVE  NEGATIVE Final  . Leukocytes, UA 10/12/2018 NEGATIVE  NEGATIVE Final  . RBC / HPF 10/12/2018 0-5  0 - 5 RBC/hpf Final  . WBC, UA 10/12/2018 6-10  0 - 5 WBC/hpf Final  . Bacteria, UA 10/12/2018 RARE* NONE SEEN Final  . Squamous Epithelial / LPF 10/12/2018 11-20  0 - 5 Final  . Mucus 10/12/2018 PRESENT   Final   Performed at [facility], [address], [place], [place] [address]  . Fecal Occult Bld 10/12/2018 NEGATIVE  NEGATIVE Final   Performed at [facility], [address] [place], [place] [address]  . Total Bilirubin 10/13/2018 0.4  0.3 - 1.2 mg/dL Final   Performed at [facility], [address] [place], [place] [address]  . Polyspecific AHG test 10/13/2018    Final                   Value:NEG Performed at [facility], [address] [place], [place] [address]     Assessment:  Andrea Miller is a 32 y.o. female with small bilateral pulmonary emboli.  Risk factors for thrombosis include obesity and birth control pills.  Chest CT angiogram on 09/15/2018 revealed small bilateral pulmonary emboli and mild cardiomegaly.  Bilateral lower extremity duplex on 09/16/2018 revealed no evidence of lower extremity DVT.    She has iron deficiency anemia.She notes a recent history of extremely heavy mensesx 3 weeks (none in past 2 weeks). Hemoglobin was 10.5 on 10/29/2019and 6.5 on 10/10/2018. Work-up on 10/10/2018 revealed a  ferritinof 4, iron saturation 3%. She hasice pica.  She received 3 units of PRBCs.  She is on oral iron.  She has B12 deficiency. B12 was 242 on 10/10/2018. She began B12 injections on 10/13/2018.  Diet has been protein shakes and Weight Watcher's.  Symptomatically, she feels fatigued.  She notes a little shortness of breath and dizziness.  Exam is  stable.  Plan: 1.   Labs today:  CBC with diff.  2.  Iron deficiency anemia:  Etiology of iron deficiency appears to be due to extremely heavy menses (none in 2 weeks).  Urinalysis reveals hemoglobinuria.  DAT and bilirubin post transfusion were negative.  Guaiac stool x 2 were negative for blood.  She is on oral iron BID with vitamin C.  Patient has decided to proceed with IV iron.  Schedule weekly labs (urine HCG) and Venofer weekly x 3.  3.   B12 deficiency:  Patient started on B12 injections on 10/13/2018.  Continue B12 weekly x 5 beginning 10/21/2018 then monthly. 4. Menorrhagia:  Discuss importance of GYN consult in the near future secondary to history of heavy menses.  Patient notes next menses to start between 11/18 - 11/20. 5.  Pulmonary embolism:  Patient on Xarelto  Etiology of thrombosis felt secondary to weight and birth control pills  Initial hypercoagulable work-up revealed a lupus anticoagulant (? Affected by Xarelto).  Plan repeat testing in 3 months..  Additional testing in 3 months (protein C, protein S, and ATIII, lupus anticoagulant). 6.  Gastric bypass surgery:  Plan in 6 months per patient.  Discuss issues with iron deficiency and B12 deficiency likely necessitating ongoing replacement. 7.  RTC in 6 weeks for MD assessment and labs (CBC with diff, ferritin-day before), and +/- Venofer.   Honor Loh, NP  10/15/2018, 11:49 AM   I saw and evaluated the patient, participating in the key portions of the service and reviewing pertinent diagnostic studies and records.  I reviewed the nurse  practitioner's note and agree with the findings and the plan.  The assessment and plan were discussed with the patient.  Multiple questions were asked by the patient and answered.   Nolon Stalls, MD 10/15/2018,11:49 AM

## 2018-10-15 NOTE — Progress Notes (Signed)
Patient here today at request of Dr. Merlene Pulling.  Patient states she is feeling a little light headed today.  She has not eaten since lunchtime yesterday.

## 2018-10-16 ENCOUNTER — Other Ambulatory Visit: Payer: Self-pay

## 2018-10-16 ENCOUNTER — Other Ambulatory Visit (HOSPITAL_COMMUNITY): Payer: Self-pay

## 2018-10-17 LAB — PROTHROMBIN GENE MUTATION

## 2018-10-17 LAB — FACTOR 5 LEIDEN

## 2018-10-21 ENCOUNTER — Inpatient Hospital Stay: Payer: BLUE CROSS/BLUE SHIELD

## 2018-10-21 ENCOUNTER — Ambulatory Visit: Payer: Self-pay

## 2018-10-22 ENCOUNTER — Encounter (HOSPITAL_COMMUNITY): Admission: RE | Payer: Self-pay | Source: Ambulatory Visit

## 2018-10-22 ENCOUNTER — Inpatient Hospital Stay: Payer: BLUE CROSS/BLUE SHIELD

## 2018-10-22 ENCOUNTER — Inpatient Hospital Stay (HOSPITAL_COMMUNITY): Admission: RE | Admit: 2018-10-22 | Payer: BLUE CROSS/BLUE SHIELD | Source: Ambulatory Visit | Admitting: Surgery

## 2018-10-22 SURGERY — LAPAROSCOPIC ROUX-EN-Y GASTRIC BYPASS WITH UPPER ENDOSCOPY
Anesthesia: General

## 2018-10-28 ENCOUNTER — Inpatient Hospital Stay: Payer: BLUE CROSS/BLUE SHIELD

## 2018-10-28 ENCOUNTER — Ambulatory Visit: Payer: Self-pay

## 2018-10-28 ENCOUNTER — Inpatient Hospital Stay: Payer: BLUE CROSS/BLUE SHIELD | Attending: Hematology and Oncology

## 2018-10-28 DIAGNOSIS — I2694 Multiple subsegmental pulmonary emboli without acute cor pulmonale: Secondary | ICD-10-CM

## 2018-10-28 DIAGNOSIS — E538 Deficiency of other specified B group vitamins: Secondary | ICD-10-CM | POA: Insufficient documentation

## 2018-10-28 LAB — PREGNANCY, URINE: Preg Test, Ur: NEGATIVE

## 2018-10-28 MED ORDER — CYANOCOBALAMIN 1000 MCG/ML IJ SOLN
1000.0000 ug | Freq: Once | INTRAMUSCULAR | Status: AC
  Administered 2018-10-28: 1000 ug via INTRAMUSCULAR
  Filled 2018-10-28: qty 1

## 2018-10-28 NOTE — Progress Notes (Deleted)
Subjective:    Patient ID: Andrea Miller, female    DOB: 01-Jul-1986, 32 y.o.   MRN: [id number]  HPI  Ms Whistler is a 32 y/o female with a history of asthma, HTN, SVT, obesity, bipolar, anxiety, depression, fibromyalgia, seizures, bulging disc and chronic heart failure.   Echo report from 07/04/18 reviewed and showed an EF of 73%. Echo report from 04/15/17 reviewed and showed an EF of 60-65%.  Admitted 10/11/18 due to anemia thought to be due to heavy menses and xarelto use. Xarelto held while admitted and 2 units of PRBC's were given. Hematology consult obtained and given iron infusion along with iron supplements. Discharged after 2 days. Was in the ED 09/24/18 due to chest pain. Chest CTA was unremarkable and she was released. Admitted 09/15/18 due to pulmonary embolus thought to be due to oral contraceptives. Anticoagulated and released the next day. Was in the ED 09/04/18 due to somnolence thought to be due to polypharmacy. Evaluated and released. Was in the ED 08/09/18 due to psuedoseizures where she was treated and released. Was in the ED 06/29/18 due to chest pain where she was treated and released after work-up was negative. Was in the ED 06/22/18 due to chest pain. [place] negative for DVT. Chest CT negative for PE. Released same day.   She presents today for a follow-up visit with a chief complaint of   Past Medical History:  Diagnosis Date  . Anxiety   . Asthma   . Bipolar disorder (HCC)   . Chest pain    and angina  . CHF (congestive heart failure) (HCC)   . Depression   . Fibromyalgia   . Hypertension   . 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 Obesity   . Pre-diabetes   . Seizures (HCC)    secondary to anxiety  last seizure 01/25/18  . SVT (supraventricular tachycardia) (HCC)    with hx of syncope; managed by [facility]   Past Surgical History:  Procedure Laterality Date  . CHOLECYSTECTOMY N/A 02/22/2018   Procedure: LAPAROSCOPIC CHOLECYSTECTOMY;   Surgeon: Ancil Linsey, MD;  Location: [facility] ORS;  Service: General;  Laterality: N/A;  . TONSILLECTOMY AND ADENOIDECTOMY N/A 09/05/2016   Procedure: TONSILLECTOMY AND ADENOIDECTOMY;  Surgeon: Linus Salmons, MD;  Location: [facility] ORS;  Service: ENT;  Laterality: N/A;   Family History  Problem Relation Age of Onset  . Diabetes Mother   . Hypertension Mother   . Asthma Mother   . Gallstones Mother   . Diabetes Father   . Hypertension Father   . Gallstones Father   . Bipolar disorder Sister   . COPD Brother   . Schizophrenia Brother   . COPD Brother   . Hypertension Brother    Social History   Tobacco Use  . Smoking status: Never Smoker  . Smokeless tobacco: Never Used  Substance Use Topics  . Alcohol use: No   Allergies  Allergen Reactions  . Cortizone-10 [Hydrocortisone] Shortness Of Breath and Swelling  . Garlic Anaphylaxis  . Prednisone Swelling    Tongue swelling  . Corticosteroids Palpitations     Review of Systems  Constitutional: Positive for fatigue. Negative for appetite change.  HENT: Negative for congestion, postnasal drip and sore throat.   Eyes: Negative.   Respiratory: Positive for shortness of breath. Negative for cough.   Cardiovascular: Positive for chest pain (intermittent), palpitations (at times) and leg swelling (better, now just in the foot).  Gastrointestinal:  Negative for abdominal distention and abdominal pain.  Endocrine: Negative.   Genitourinary: Negative.   Musculoskeletal: Positive for arthralgias (leg pain) and back pain (due to bulging disc).  Skin: Negative.   Allergic/Immunologic: Negative.   Neurological: Positive for light-headedness and headaches. Negative for dizziness.  Hematological: Negative for adenopathy. Does not bruise/bleed easily.  Psychiatric/Behavioral: Positive for dysphoric mood. Negative for sleep disturbance (wearing CPAP at night). The patient is not nervous/anxious.         Objective:   Physical Exam   Constitutional: She is oriented to person, place, and time. She appears well-developed and well-nourished.  HENT:  Head: Normocephalic and atraumatic.  Neck: Normal range of motion. Neck supple. No JVD present.  Cardiovascular: Normal rate and regular rhythm.  Pulmonary/Chest: Effort normal. No respiratory distress. She has no wheezes. She has no rales.  Abdominal: Soft. She exhibits no distension.  Musculoskeletal:       Right lower leg: She exhibits edema (1+ pitting). She exhibits no tenderness.       Left lower leg: She exhibits edema (1+ pitting). She exhibits no tenderness.  Neurological: She is alert and oriented to person, place, and time.  Skin: Skin is warm and dry.  Psychiatric: She has a normal mood and affect. Her behavior is normal.  Nursing note and vitals reviewed.     Assessment & Plan:   1: Chronic heart failure with preserved ejection fraction- - NYHA class III - euvolemic today - weighing daily; reminded to call for an overnight weight gain of >2 pounds or a weekly weight gain of >5 pounds - weight  - not adding salt and occasionally reads food labels; reviewed the importance of closely following a 2000mg  sodium diet  - saw cardiology Welton Flakes) 07/04/18  - saw pulmonologist Sung Amabile) 07/31/18 - drinking ~ 64 ounces of fluid daily - BNP 06/29/18 was 49.0 - had labs done at [facility] so will request those results  2: HTN- - BP  - saw PCP Letta Pate) at [facility] last week - BMP 10/12/18 reviewed and showed sodium 139, potassium 3.4, creatinine 0.87 and GFR >60  3: Obstructive sleep apnea- - wearing CPAP nightly and feels like she's sleeping well  4: Lymphedema- - stage 2 - elevating legs some during the day - limited in her ability to exercise due to disc in her back causing pain - wearing compression socks but edema persists - will make a referral for lymphapress compression boots   Patient did not bring her medications nor a list. Each medication was  verbally reviewed with the patient and she was encouraged to bring the bottles to every visit to confirm accuracy of list.

## 2018-10-28 NOTE — Progress Notes (Signed)
Patient could not wait to receive iron infusion today.  Informed Lowry Ram, NP that patient not able to wait for iron but would like to get the B12 inj and he approves.  She will be added for 1 more week of weekly iron to receive the 3 consecutive infusions and B12 inj given today.

## 2018-10-29 ENCOUNTER — Ambulatory Visit: Payer: Self-pay | Admitting: Family

## 2018-11-04 ENCOUNTER — Inpatient Hospital Stay: Payer: BLUE CROSS/BLUE SHIELD

## 2018-11-04 ENCOUNTER — Ambulatory Visit: Payer: Self-pay

## 2018-11-04 ENCOUNTER — Telehealth: Payer: Self-pay

## 2018-11-04 NOTE — Telephone Encounter (Signed)
called the patient several times and the line is busy. No answer. Unable to get in touch with the patient.

## 2018-11-04 NOTE — Telephone Encounter (Signed)
spoke with the patient she has informed me that due to transpatation issue the can not come into the office. I have contacted Quentin Mulling and it was ok to start Oral B-12 1,000 mcg and Iron 325mg  of ferrous sulfate today. The patient was agreeable and understanding.

## 2018-11-05 ENCOUNTER — Telehealth: Payer: Self-pay

## 2018-11-05 ENCOUNTER — Ambulatory Visit: Payer: Self-pay | Admitting: Family

## 2018-11-05 NOTE — Telephone Encounter (Signed)
I have tried calling this patient several times and has been unsucessful. The patient has not called back today. I have been calling [phone]. Only a busy sound.

## 2018-11-07 ENCOUNTER — Inpatient Hospital Stay: Payer: BLUE CROSS/BLUE SHIELD

## 2018-11-08 ENCOUNTER — Ambulatory Visit: Payer: Self-pay | Admitting: Family

## 2018-11-11 ENCOUNTER — Ambulatory Visit: Payer: Self-pay

## 2018-11-11 ENCOUNTER — Inpatient Hospital Stay: Payer: BLUE CROSS/BLUE SHIELD

## 2018-11-14 ENCOUNTER — Other Ambulatory Visit: Payer: Self-pay

## 2018-11-18 ENCOUNTER — Ambulatory Visit: Payer: Self-pay

## 2018-11-20 ENCOUNTER — Encounter: Payer: Self-pay | Admitting: Hematology and Oncology

## 2018-11-21 ENCOUNTER — Inpatient Hospital Stay: Payer: BLUE CROSS/BLUE SHIELD

## 2018-11-21 ENCOUNTER — Other Ambulatory Visit: Payer: Self-pay | Admitting: *Deleted

## 2018-11-21 DIAGNOSIS — E538 Deficiency of other specified B group vitamins: Secondary | ICD-10-CM

## 2018-11-22 ENCOUNTER — Other Ambulatory Visit: Payer: Self-pay | Admitting: Hematology and Oncology

## 2018-11-22 ENCOUNTER — Inpatient Hospital Stay: Payer: BLUE CROSS/BLUE SHIELD

## 2018-11-22 ENCOUNTER — Inpatient Hospital Stay: Payer: BLUE CROSS/BLUE SHIELD | Admitting: Hematology and Oncology

## 2018-11-22 DIAGNOSIS — E538 Deficiency of other specified B group vitamins: Secondary | ICD-10-CM

## 2018-11-22 NOTE — Progress Notes (Deleted)
[facility]  Clinic day:  11/22/2018  Chief Complaint: Andrea Miller is a 32 y.o. female with pulmonary embolism, iron deficiency anemia secondary to menorrhagia, B12 deficiency who is seen for 6 week assessment.  HPI:  The patient was last seen in the hematology clinic on 10/15/2018.  She was seen after recent hospitalization for symptomatic anemia.  She feels fatigued.  She noted a little shortness of breath and dizziness.  Exam was stable.  She was on oral iron BID.  She decided to switch to IV iron.  She received B12 on 10/28/2018.  She declined IV iron that day.  Notes revealed that she had transportation problems.  She was to start oral B12 1000 mcg po q day and ferrous sulfate 325 mg a day.  During the interim,    Past Medical History:  Diagnosis Date  . Anxiety   . Asthma   . Bipolar disorder (HCC)   . Chest pain    and angina  . CHF (congestive heart failure) (HCC)   . Depression   . Fibromyalgia   . Hypertension   . 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 Obesity   . Pre-diabetes   . Seizures (HCC)    secondary to anxiety  last seizure 01/25/18  . SVT (supraventricular tachycardia) (HCC)    with hx of syncope; managed by [facility]    Past Surgical History:  Procedure Laterality Date  . CHOLECYSTECTOMY N/A 02/22/2018   Procedure: LAPAROSCOPIC CHOLECYSTECTOMY;  Surgeon: Ancil Linsey, MD;  Location: [facility] ORS;  Service: General;  Laterality: N/A;  . TONSILLECTOMY AND ADENOIDECTOMY N/A 09/05/2016   Procedure: TONSILLECTOMY AND ADENOIDECTOMY;  Surgeon: Linus Salmons, MD;  Location: [facility] ORS;  Service: ENT;  Laterality: N/A;    Family History  Problem Relation Age of Onset  . Diabetes Mother   . Hypertension Mother   . Asthma Mother   . Gallstones Mother   . Diabetes Father   . Hypertension Father   . Gallstones Father   . Bipolar disorder Sister   . COPD Brother   . Schizophrenia  Brother   . COPD Brother   . Hypertension Brother     Social History:  reports that she has never smoked. She has never used smokeless tobacco. She reports that she does not drink alcohol or use drugs.  No tobacco, tobacco, or illegal subatance use. Prior to her back issues, patient was employed full time at [employer]. The patient is alone today.  Allergies:  Allergies  Allergen Reactions  . Cortizone-10 [Hydrocortisone] Shortness Of Breath and Swelling  . Garlic Anaphylaxis  . Prednisone Swelling    Tongue swelling  . Corticosteroids Palpitations    Current Medications: Current Outpatient Medications  Medication Sig Dispense Refill  . albuterol (PROVENTIL HFA;VENTOLIN HFA) 108 (90 Base) MCG/ACT inhaler Inhale 2 puffs into the lungs every 6 (six) hours as needed for wheezing or shortness of breath. 1 Inhaler 10  . albuterol (PROVENTIL) (2.5 MG/3ML) 0.083% nebulizer solution Take 2.5 mg by nebulization every 4 (four) hours as needed for wheezing or shortness of breath.    . budesonide-formoterol (SYMBICORT) 160-4.5 MCG/ACT inhaler Inhale 2 puffs into the lungs 2 (two) times daily. 1 Inhaler 10  . Calcium-Magnesium 500-250 MG TABS Take 1 tablet by mouth 2 (two) times daily.     . clonazePAM (KLONOPIN) 2 MG tablet Take 2 mg by mouth 3 (three) times daily.     Marland Kitchen  DEXILANT 30 MG capsule Take 1 capsule by mouth daily.  5  . escitalopram (LEXAPRO) 20 MG tablet Take 20 mg by mouth daily.    . ferrous sulfate 325 (65 FE) MG tablet Take 1 tablet (325 mg total) by mouth 2 (two) times daily with a meal. 120 tablet 0  . fluticasone (FLONASE) 50 MCG/ACT nasal spray Place 2 sprays into both nostrils daily as needed for allergies.     . folic acid (FOLVITE) 1 MG tablet Take 1 mg by mouth daily.   10  . furosemide (LASIX) 20 MG tablet Take 40 mg by mouth 2 (two) times daily.     Marland Kitchen ketoconazole (NIZORAL) 2 % shampoo Apply 1 application topically every 30 (thirty) days.   1  . lactulose (CHRONULAC) 10  GM/15ML solution Take 30 g by mouth daily as needed for mild constipation.   5  . lamoTRIgine (LAMICTAL) 100 MG tablet Take 200 mg by mouth 2 (two) times daily.     Marland Kitchen linaclotide (LINZESS) 290 MCG CAPS capsule Take 290 mcg by mouth daily before breakfast.    . montelukast (SINGULAIR) 10 MG tablet Take 1 tablet (10 mg total) by mouth at bedtime. 30 tablet 5  . mupirocin ointment (BACTROBAN) 2 % Apply 1 application topically 2 (two) times daily.   0  . orlistat (XENICAL) 120 MG capsule Take 120 mg by mouth 3 (three) times daily with meals.    . Potassium Chloride ER 20 MEQ TBCR Take 20 mEq by mouth 2 (two) times daily.     . pregabalin (LYRICA) 50 MG capsule Take 1 capsule (50 mg total) by mouth 2 (two) times daily. 60 capsule 0  . Prenatal Vit-Fe Fumarate-FA (MULTIVITAMIN-PRENATAL) 27-0.8 MG TABS tablet Take 1 tablet by mouth daily at 12 noon.    . promethazine (PHENERGAN) 12.5 MG tablet Take 2 tablets (25 mg total) by mouth every 6 (six) hours as needed for nausea or vomiting. 20 tablet 0  . propranolol (INDERAL) 40 MG tablet Take 2 tablets (80 mg total) by mouth 3 (three) times daily. 90 tablet 0  . risperiDONE (RISPERDAL) 3 MG tablet Take 3 mg by mouth daily.     . Rivaroxaban 15 & 20 MG TBPK Take as directed on package: Start with one 15mg  tablet by mouth twice a day with food. On Day 22, switch to one 20mg  tablet once a day with food. 51 each 0  . rosuvastatin (CRESTOR) 20 MG tablet Take 20 mg by mouth daily.    Marland Kitchen tiZANidine (ZANAFLEX) 4 MG tablet Take 4 mg by mouth 3 (three) times daily.   1  . Topiramate ER (TROKENDI XR) 200 MG CP24 Take 1 capsule by mouth daily.     . vitamin B-12 (CYANOCOBALAMIN) 1000 MCG tablet Take 1 tablet (1,000 mcg total) by mouth daily. 90 tablet 0  . vitamin C (VITAMIN C) 250 MG tablet Take 1 tablet (250 mg total) by mouth 2 (two) times daily. 60 tablet 0   No current facility-administered medications for this visit.     Review of Systems:  GENERAL:  Fatigue.   No fevers, sweats.  Intentional weight loss. PERFORMANCE STATUS (ECOG):  1-2 HEENT:  Blurred vision.  No runny nose, sore throat, mouth sores or tenderness. Lungs: Little shortness of breath.  No cough.  No hemoptysis. Cardiac:  Chronic heart issues.  No orthopnea, or PND. GI:  No nausea, vomiting, diarrhea, constipation, melena or hematochezia. GU:  Chronic urgency and frequency.  No  dysuria, or hematuria. Musculoskeletal:  Bulging disk.  No joint pain.  No muscle tenderness. Extremities:  No pain or swelling. Skin:  Thickened skin.  No rashes or skin changes. Neuro:  Headaches.  Vertigo with changes in position (little).  No numbness or weakness, balance or coordination issues. Endocrine:  No diabetes, thyroid issues, hot flashes or night sweats. Psych:  No mood changes, depression or anxiety. Pain:  No focal pain. Review of systems:  All other systems reviewed and found to be negative.   Physical Exam: There were no vitals taken for this visit. GENERAL:  Well developed, well nourished, heavyset woman sitting comfortably in the exam room in no acute distress. MENTAL STATUS:  Alert and oriented to person, place and time. HEAD:  Dark hair.  Normocephalic, atraumatic, face symmetric, no Cushingoid features. EYES:  Glasses.  Brown eyes.  Pupils equal round and reactive to light and accomodation.  No conjunctivitis or scleral icterus. ENT:  Oropharynx clear without lesion.  Tongue normal. Mucous membranes moist.  RESPIRATORY:  Clear to auscultation without rales, wheezes or rhonchi. CARDIOVASCULAR:  Regular rate and rhythm without murmur, rub or gallop. ABDOMEN:  Fully round.  Soft, non-tender, with active bowel sounds, and no appreciable hepatosplenomegaly.  No masses. SKIN:  No rashes, ulcers or lesions. EXTREMITIES: No edema, no skin discoloration or tenderness.  No palpable cords. LYMPH NODES: No palpable cervical, supraclavicular, axillary or inguinal adenopathy  NEUROLOGICAL:  Unremarkable. PSYCH:  Appropriate.    No visits with results within 3 Day(s) from this visit.  Latest known visit with results is:  Appointment on 10/28/2018  Component Date Value Ref Range Status  . Preg Test, Ur 10/28/2018 NEGATIVE  NEGATIVE Final   Performed at [facility], [address] [place], [place] [id number]    Assessment:  Andrea Miller is a 32 y.o. female with small bilateral pulmonary emboli.  Risk factors for thrombosis include obesity and birth control pills.  Chest CT angiogram on 09/15/2018 revealed small bilateral pulmonary emboli and mild cardiomegaly.  Bilateral lower extremity duplex on 09/16/2018 revealed no evidence of lower extremity DVT.    She has iron deficiency anemia.She notes a recent history of extremely heavy mensesx 3 weeks (none in past 2 weeks). Hemoglobin was 10.5 on 10/29/2019and 6.5 on 10/10/2018. Work-up on 10/10/2018 revealed a ferritinof 4, iron saturation 3%. She hasice pica.  She received 3 units of PRBCs.  She is on oral iron.  She has B12 deficiency. B12 was 242 on 10/10/2018. She began B12 injections on 10/13/2018.  Diet has been protein shakes and Weight Watcher's.  Symptomatically,  she feels fatigued.  She notes a little shortness of breath and dizziness.  Exam is stable.  Plan: 1.   Labs today:  CBC with diff, ferritin, B12.    2.  Iron deficiency anemia:  Etiology of iron deficiency appears to be due to extremely heavy menses (none in 2 weeks).  Urinalysis reveals hemoglobinuria.  DAT and bilirubin post transfusion were negative.  Guaiac stool x 2 were negative for blood.  She is on oral iron BID with vitamin C.  Patient has decided to proceed with IV iron.  Schedule weekly labs (urine HCG) and Venofer weekly x 3.  3.   B12 deficiency:  Patient started on B12 injections on 10/13/2018.  Continue B12 weekly x 5 beginning 10/21/2018 then monthly. 4. Menorrhagia:  Discuss importance of GYN  consult in the near future secondary to history of heavy menses.  Patient notes next  menses to start between 11/18 - 11/20. 5.  Pulmonary embolism:  Patient on Xarelto  Etiology of thrombosis felt secondary to weight and birth control pills  Initial hypercoagulable work-up revealed a lupus anticoagulant (? Affected by Xarelto).  Plan repeat testing in 3 months..  Additional testing in 3 months (protein C, protein S, and ATIII, lupus anticoagulant). 6.  Gastric bypass surgery:  Plan in 6 months per patient.  Discuss issues with iron deficiency and B12 deficiency likely necessitating ongoing replacement. 7.  RTC in 6 weeks for MD assessment and labs (CBC with diff, ferritin-day before), and +/- Venofer.   Rosey Bath, MD  11/22/2018, 5:07 AM   I saw and evaluated the patient, participating in the key portions of the service and reviewing pertinent diagnostic studies and records.  I reviewed the nurse practitioner's note and agree with the findings and the plan.  The assessment and plan were discussed with the patient.  Multiple questions were asked by the patient and answered.   Nelva Nay, MD 11/22/2018,5:07 AM

## 2018-11-25 ENCOUNTER — Inpatient Hospital Stay: Payer: BLUE CROSS/BLUE SHIELD

## 2018-11-26 ENCOUNTER — Telehealth: Payer: Self-pay | Admitting: *Deleted

## 2018-11-26 NOTE — Telephone Encounter (Signed)
She is welcome to try OTC iron supplementation, however in the past I believe that it caused her significant side effects and she had compliance issues. I would have her start with 1 pill daily, with a course of vitamin C, advancing as tolerated to 1 pill TID.  Needs to keep appointments for labs and MD for [place] to assess her progress.   Judie Grieve

## 2018-11-26 NOTE — Telephone Encounter (Signed)
Patient called requesting we order oral iron for her stating she has transportation issues and cannot come in to office for IV infusions. She reports she is having pica again. Please advise

## 2018-11-26 NOTE — Telephone Encounter (Signed)
I returned call to patient and left message on her voice mail of NP recommendations

## 2018-12-16 ENCOUNTER — Ambulatory Visit: Payer: Self-pay

## 2018-12-23 ENCOUNTER — Inpatient Hospital Stay: Payer: BLUE CROSS/BLUE SHIELD | Attending: Hematology and Oncology

## 2019-01-01 ENCOUNTER — Telehealth: Payer: Self-pay | Admitting: *Deleted

## 2019-01-01 NOTE — Telephone Encounter (Signed)
Labs are fine (CBC with diff, ferritin). She was getting Venofer, but called and requested to change to oral due to the cost of the infusions. Checking her labs is going to tell us that she needs iron. Is she willing to continue?  She will need RTC and +/- Venofer as well before she can proceed with additional infusions. We have not seen her since November.   Quentin Mulling, MSN, APRN, FNP-C, CEN Oncology/Hematology Nurse Practitioner  [facility] 01/01/19, 9:44 AM

## 2019-01-01 NOTE — Telephone Encounter (Signed)
Patient called requesting lab check citing PICA, shortness of breath, dizziness, and lightheadedness. She missed her B 12 and Venofer appts she states because of "car problems". Currently her phone is out of order so she will call me back at 11 to see if she can come to [place] office for lab check. Please advise

## 2019-01-01 NOTE — Telephone Encounter (Signed)
Patient never called me back and I cannot get an answer on any of her contactsd number s either.

## 2019-01-06 ENCOUNTER — Ambulatory Visit: Payer: Self-pay

## 2019-01-13 ENCOUNTER — Other Ambulatory Visit: Payer: Self-pay

## 2019-01-13 ENCOUNTER — Emergency Department (HOSPITAL_COMMUNITY): Payer: BLUE CROSS/BLUE SHIELD

## 2019-01-13 ENCOUNTER — Emergency Department (HOSPITAL_COMMUNITY)
Admission: EM | Admit: 2019-01-13 | Discharge: 2019-01-13 | Disposition: A | Discharge status: Home / Self Care | Payer: BLUE CROSS/BLUE SHIELD | Attending: Emergency Medicine | Admitting: Emergency Medicine

## 2019-01-13 ENCOUNTER — Encounter (HOSPITAL_COMMUNITY): Payer: Self-pay

## 2019-01-13 DIAGNOSIS — R5383 Other fatigue: Secondary | ICD-10-CM | POA: Insufficient documentation

## 2019-01-13 DIAGNOSIS — I5032 Chronic diastolic (congestive) heart failure: Secondary | ICD-10-CM | POA: Diagnosis not present

## 2019-01-13 DIAGNOSIS — R0602 Shortness of breath: Secondary | ICD-10-CM | POA: Insufficient documentation

## 2019-01-13 DIAGNOSIS — R0789 Other chest pain: Secondary | ICD-10-CM | POA: Diagnosis present

## 2019-01-13 DIAGNOSIS — K029 Dental caries, unspecified: Secondary | ICD-10-CM | POA: Insufficient documentation

## 2019-01-13 DIAGNOSIS — Z79899 Other long term (current) drug therapy: Secondary | ICD-10-CM | POA: Diagnosis not present

## 2019-01-13 DIAGNOSIS — E119 Type 2 diabetes mellitus without complications: Secondary | ICD-10-CM | POA: Insufficient documentation

## 2019-01-13 DIAGNOSIS — I110 Hypertensive heart disease with heart failure: Secondary | ICD-10-CM | POA: Diagnosis not present

## 2019-01-13 DIAGNOSIS — J45909 Unspecified asthma, uncomplicated: Secondary | ICD-10-CM | POA: Insufficient documentation

## 2019-01-13 DIAGNOSIS — I2510 Atherosclerotic heart disease of native coronary artery without angina pectoris: Secondary | ICD-10-CM | POA: Insufficient documentation

## 2019-01-13 HISTORY — DX: Other pulmonary embolism without acute cor pulmonale: I26.99

## 2019-01-13 LAB — BASIC METABOLIC PANEL
Anion gap: 9 (ref 5–15)
BUN: 7 mg/dL (ref 6–20)
CO2: 27 mmol/L (ref 22–32)
Calcium: 9.4 mg/dL (ref 8.9–10.3)
Chloride: 106 mmol/L (ref 98–111)
Creatinine, Ser: 0.78 mg/dL (ref 0.44–1.00)
GFR calc Af Amer: >60 mL/min (ref >60)
GFR calc non Af Amer: >60 mL/min (ref >60)
Glucose, Bld: 92 mg/dL (ref 70–99)
Potassium: 3.5 mmol/L (ref 3.5–5.1)
Sodium: 142 mmol/L (ref 135–145)

## 2019-01-13 LAB — CBC
HCT: 42.2 % (ref 36.0–46.0)
Hemoglobin: 11.9 g/dL — ABNORMAL LOW (ref 12.0–15.0)
MCH: 22.6 pg — ABNORMAL LOW (ref 26.0–34.0)
MCHC: 28.2 g/dL — ABNORMAL LOW (ref 30.0–36.0)
MCV: 80.2 fL (ref 80.0–100.0)
Platelets: 278 10*3/uL (ref 150–400)
RBC: 5.26 MIL/uL — ABNORMAL HIGH (ref 3.87–5.11)
RDW: 18.6 % — ABNORMAL HIGH (ref 11.5–15.5)
WBC: 4.9 10*3/uL (ref 4.0–10.5)
nRBC: 0 % (ref 0.0–0.2)

## 2019-01-13 LAB — PREGNANCY, URINE: Preg Test, Ur: NEGATIVE

## 2019-01-13 LAB — URINALYSIS, ROUTINE W REFLEX MICROSCOPIC
Bilirubin Urine: NEGATIVE
Glucose, UA: NEGATIVE mg/dL
Hgb urine dipstick: NEGATIVE
Ketones, ur: NEGATIVE mg/dL
Leukocytes,Ua: NEGATIVE
Nitrite: NEGATIVE
Protein, ur: NEGATIVE mg/dL
Specific Gravity, Urine: 1.019 (ref 1.005–1.030)
pH: 7 (ref 5.0–8.0)

## 2019-01-13 LAB — D-DIMER, QUANTITATIVE: D-Dimer, Quant: 0.48 ug/mL-FEU (ref 0.00–0.50)

## 2019-01-13 LAB — I-STAT BETA HCG BLOOD, ED (NOT ORDERABLE): I-stat hCG, quantitative: <5 m[IU]/mL (ref <5)

## 2019-01-13 LAB — TROPONIN I: Troponin I: <0.03 ng/mL (ref <0.03)

## 2019-01-13 MED ORDER — HYDROCODONE-ACETAMINOPHEN 5-325 MG PO TABS: ORAL_TABLET | ORAL | 0 refills | Status: DC

## 2019-01-13 MED ORDER — HYDROCODONE-ACETAMINOPHEN 5-325 MG PO TABS
1.0000 | ORAL_TABLET | Freq: Once | ORAL | Status: AC
  Administered 2019-01-13: 1 via ORAL
  Filled 2019-01-13: qty 1

## 2019-01-13 MED ORDER — IOPAMIDOL (ISOVUE-370) INJECTION 76%
100.0000 mL | Freq: Once | INTRAVENOUS | Status: AC | PRN
  Administered 2019-01-13: 100 mL via INTRAVENOUS

## 2019-01-13 MED ORDER — KETOROLAC TROMETHAMINE 30 MG/ML IJ SOLN
30.0000 mg | Freq: Once | INTRAMUSCULAR | Status: AC
  Administered 2019-01-13: 30 mg via INTRAVENOUS
  Filled 2019-01-13: qty 1

## 2019-01-13 MED ORDER — ONDANSETRON HCL 4 MG/2ML IJ SOLN
4.0000 mg | INTRAMUSCULAR | Status: AC | PRN
  Administered 2019-01-13 (×2): 4 mg via INTRAVENOUS
  Filled 2019-01-13 (×2): qty 2

## 2019-01-13 MED ORDER — CLINDAMYCIN HCL 150 MG PO CAPS: ORAL_CAPSULE | ORAL | 0 refills | Status: DC

## 2019-01-13 MED ORDER — METHOCARBAMOL 500 MG PO TABS: 1000.0000 mg | ORAL_TABLET | Freq: Four times a day (QID) | ORAL | 0 refills | Status: DC | PRN

## 2019-01-13 MED ORDER — IPRATROPIUM-ALBUTEROL 0.5-2.5 (3) MG/3ML IN SOLN
3.0000 mL | Freq: Once | RESPIRATORY_TRACT | Status: AC
  Administered 2019-01-13: 3 mL via RESPIRATORY_TRACT
  Filled 2019-01-13: qty 3

## 2019-01-13 NOTE — ED Triage Notes (Signed)
Pt reports body aches and fatigue for 2 weeks. Pt reports that she was seen by primary care and given iron pills and has been taking them . Pt reports chest pain and SOB for 4 days. Pt reports that she is on blood thinners for PE last oct. Also reports dental pain right side of mouth. Thinks she brought tooth by popcorn

## 2019-01-13 NOTE — Discharge Instructions (Signed)
Take the prescriptions as directed.  Apply moist heat or ice to the area(s) of discomfort, for 15 minutes at a time, several times per day for the next few days.  Do not fall asleep on a heating or ice pack.  Call your regular medical doctor tomorrow to schedule a follow up appointment this week.  Call your dentist tomorrow to schedule a follow up appointment within the next week. Return to the Emergency Department immediately if worsening.

## 2019-01-13 NOTE — ED Notes (Signed)
Pt returned from CT °

## 2019-01-13 NOTE — ED Provider Notes (Signed)
[facility] EMERGENCY DEPARTMENT Provider Note   CSN: [id number] Arrival date & time: 01/13/19  1914    History   Chief Complaint Chief Complaint  Patient presents with  . Chest Pain    HPI Andrea Miller is a 33 y.o. female.     HPI  Pt was seen at 1430. Per pt, c/o gradual onset and persistence of constant chest "pain" and SOB for the past 3 to 4 days. Pt describes the pain as constant, "dull" and "sharp," located in her mid-sternal and left upper chest areas. Symptoms worsen with palpitation of the area and deep breath. Pt states she also has had generalized fatigue for the past 2 weeks. States she was evaluated by her PMD for that complaint and was started on iron for "low hemoglobin."  Pt also c/o gradual onset and persistence of constant right upper tooth "pain" for the past several days. States she thinks she broke her tooth on popcorn.  Denies fevers, no intra-oral edema, no rash, no facial swelling, no dysphagia, no neck pain.   The condition is aggravated by nothing. The condition is relieved by nothing. Denies abd pain, no N/V/D, no palpitations, no cough, no fevers, no rash, no focal motor weakness, no tingling/numbness in extremities.    Past Medical History:  Diagnosis Date  . Anxiety   . Asthma   . Bipolar disorder (HCC)   . Chest pain    and angina  . CHF (congestive heart failure) (HCC)   . Depression   . Fibromyalgia   . Hypertension   . 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 Obesity   . Pre-diabetes   . Pulmonary embolus (HCC)   . Seizures (HCC)    secondary to anxiety  last seizure 01/25/18  . SVT (supraventricular tachycardia) (HCC)    with hx of syncope; managed by [facility]    Patient Active Problem List   Diagnosis Date Noted  . Iron deficiency anemia due to chronic blood loss 10/15/2018  . B12 deficiency 10/13/2018  . Symptomatic anemia 10/11/2018  . Multiple subsegmental pulmonary emboli without acute cor  pulmonale 09/15/2018  . Morbid obesity with BMI of 50.0-59.9, adult (HCC) 09/15/2018  . Chronic diastolic heart failure (HCC) 07/01/2018  . Lymphedema 07/01/2018  . Chronic cholecystitis   . Edema 02/13/2018  . Diabetes mellitus type 2, uncomplicated (HCC) 02/13/2018  . Hypertension 11/23/2017  . Asthma exacerbation 04/14/2017  . Asthma without status asthmaticus 01/16/2017  . Bipolar affective (HCC) 01/16/2017  . Coronary artery disease 01/16/2017  . Syncope 12/12/2016  . Fibromyalgia 04/27/2014  . OSA (obstructive sleep apnea) 04/27/2014  . Seizure-like activity (HCC) 04/27/2014  . Migraine without status migrainosus, not intractable 04/27/2014  . Obesity 04/15/2014  . Sleep disorder 04/15/2014  . Neck pain 04/15/2014  . Headache 04/15/2014  . Restless legs syndrome 04/15/2014  . Supraventricular tachycardia (HCC) 07/13/2009    Past Surgical History:  Procedure Laterality Date  . CHOLECYSTECTOMY N/A 02/22/2018   Procedure: LAPAROSCOPIC CHOLECYSTECTOMY;  Surgeon: Ancil Linsey, MD;  Location: [facility] ORS;  Service: General;  Laterality: N/A;  . TONSILLECTOMY AND ADENOIDECTOMY N/A 09/05/2016   Procedure: TONSILLECTOMY AND ADENOIDECTOMY;  Surgeon: Linus Salmons, MD;  Location: [facility] ORS;  Service: ENT;  Laterality: N/A;     OB History    Gravida  0   Para  0   Term  0   Preterm  0   AB  0   Living  0     SAB  0   TAB  0   Ectopic  0   Multiple  0   Live Births               Home Medications    Prior to Admission medications   Medication Sig Start Date End Date Taking? Authorizing Provider  albuterol (PROVENTIL HFA;VENTOLIN HFA) 108 (90 Base) MCG/ACT inhaler Inhale 2 puffs into the lungs every 6 (six) hours as needed for wheezing or shortness of breath. 07/31/18   Merwyn Katos, MD  albuterol (PROVENTIL) (2.5 MG/3ML) 0.083% nebulizer solution Take 2.5 mg by nebulization every 4 (four) hours as needed for wheezing or shortness of breath.    [provider]  budesonide-formoterol (SYMBICORT) 160-4.5 MCG/ACT inhaler Inhale 2 puffs into the lungs 2 (two) times daily. 10/15/18   Shane Crutch, MD  Calcium-Magnesium 500-250 MG TABS Take 1 tablet by mouth 2 (two) times daily.     [provider]  clonazePAM (KLONOPIN) 2 MG tablet Take 2 mg by mouth 3 (three) times daily.     [provider]  DEXILANT 30 MG capsule Take 1 capsule by mouth daily. 08/28/18   [provider]  escitalopram (LEXAPRO) 20 MG tablet Take 20 mg by mouth daily.    [provider]  ferrous sulfate 325 (65 FE) MG tablet Take 1 tablet (325 mg total) by mouth 2 (two) times daily with a meal. 10/13/18   Salary, Evelena Asa, MD  fluticasone (FLONASE) 50 MCG/ACT nasal spray Place 2 sprays into both nostrils daily as needed for allergies.     [provider]  folic acid (FOLVITE) 1 MG tablet Take 1 mg by mouth daily.  12/15/16   [provider]  furosemide (LASIX) 20 MG tablet Take 40 mg by mouth 2 (two) times daily.     [provider]  ketoconazole (NIZORAL) 2 % shampoo Apply 1 application topically every 30 (thirty) days.  10/01/18   [provider]  lactulose (CHRONULAC) 10 GM/15ML solution Take 30 g by mouth daily as needed for mild constipation.  08/28/18   [provider]  lamoTRIgine (LAMICTAL) 100 MG tablet Take 200 mg by mouth 2 (two) times daily.     [provider]  linaclotide (LINZESS) 290 MCG CAPS capsule Take 290 mcg by mouth daily before breakfast.    [provider]  montelukast (SINGULAIR) 10 MG tablet Take 1 tablet (10 mg total) by mouth at bedtime. 10/15/18   Shane Crutch, MD  mupirocin ointment (BACTROBAN) 2 % Apply 1 application topically 2 (two) times daily.  10/01/18   [provider]  orlistat (XENICAL) 120 MG capsule Take 120 mg by mouth 3 (three) times daily with meals.    [provider]  Potassium Chloride ER 20 MEQ TBCR  Take 20 mEq by mouth 2 (two) times daily.     [provider]  pregabalin (LYRICA) 50 MG capsule Take 1 capsule (50 mg total) by mouth 2 (two) times daily. 09/16/18   Erick Blinks, MD  Prenatal Vit-Fe Fumarate-FA (MULTIVITAMIN-PRENATAL) 27-0.8 MG TABS tablet Take 1 tablet by mouth daily at 12 noon.    [provider]  promethazine (PHENERGAN) 12.5 MG tablet Take 2 tablets (25 mg total) by mouth every 6 (six) hours as needed for nausea or vomiting. 02/20/18   Governor Rooks, MD  propranolol (INDERAL) 40 MG tablet Take 2 tablets (80 mg total) by mouth 3 (three) times daily. 10/13/18  Salary, Evelena Asa, MD  risperiDONE (RISPERDAL) 3 MG tablet Take 3 mg by mouth daily.     [provider]  Rivaroxaban 15 & 20 MG TBPK Take as directed on package: Start with one 15mg  tablet by mouth twice a day with food. On Day 22, switch to one 20mg  tablet once a day with food. 09/16/18   Erick Blinks, MD  rosuvastatin (CRESTOR) 20 MG tablet Take 20 mg by mouth daily.    [provider]  tiZANidine (ZANAFLEX) 4 MG tablet Take 4 mg by mouth 3 (three) times daily.  03/26/17   [provider]  Topiramate ER (TROKENDI XR) 200 MG CP24 Take 1 capsule by mouth daily.     [provider]  vitamin B-12 (CYANOCOBALAMIN) 1000 MCG tablet Take 1 tablet (1,000 mcg total) by mouth daily. 10/13/18   Salary, Evelena Asa, MD  vitamin C (VITAMIN C) 250 MG tablet Take 1 tablet (250 mg total) by mouth 2 (two) times daily. 10/13/18   Salary, Evelena Asa, MD    Family History Family History  Problem Relation Age of Onset  . Diabetes Mother   . Hypertension Mother   . Asthma Mother   . Gallstones Mother   . Diabetes Father   . Hypertension Father   . Gallstones Father   . Bipolar disorder Sister   . COPD Brother   . Schizophrenia Brother   . COPD Brother   . Hypertension Brother     Social History Social History   Tobacco Use  . Smoking status: Never Smoker  . Smokeless  tobacco: Never Used  Substance Use Topics  . Alcohol use: No  . Drug use: No     Allergies   Cortizone-10 [hydrocortisone]; Garlic; Prednisone; and Corticosteroids   Review of Systems Review of Systems ROS: Statement: All systems negative except as marked or noted in the HPI; Constitutional: Negative for fever and chills. +generalized fatigue. ; ; Eyes: Negative for eye pain and discharge. ; ; ENMT: Positive for dental caries, dental hygiene poor and toothache. Negative for ear pain, bleeding gums, dental injury, facial deformity, facial swelling, hoarseness, nasal congestion, sinus pressure, sore throat, throat swelling and tongue swollen. ;; Cardiovascular: +CP, SOB. Negative for palpitations, diaphoresis, and peripheral edema. ; ; Respiratory: Negative for cough, wheezing and stridor. ; ; Gastrointestinal: Negative for nausea, vomiting, diarrhea, abdominal pain, blood in stool, hematemesis, jaundice and rectal bleeding. . ; ; Genitourinary: Negative for dysuria, flank pain and hematuria. ; ; Musculoskeletal: Negative for back pain and neck pain. Negative for swelling and trauma.; ; Skin: Negative for pruritus, rash, abrasions, blisters, bruising and skin lesion.; ; Neuro: Negative for headache, lightheadedness and neck stiffness. Negative for weakness, altered level of consciousness, altered mental status, extremity weakness, paresthesias, involuntary movement, seizure and syncope.       Physical Exam Updated Vital Signs BP (!) 151/128 (BP Location: Left Arm)   Pulse 84   Temp 98.9 F (37.2 C) (Oral)   Resp 14   Ht 5\' 10"  (1.778 m)   Wt (!) 158.8 kg   LMP 12/22/2018   SpO2 99%   BMI 50.22 kg/m     Physical Exam 1435: Physical examination: Vital signs and O2 SAT: Reviewed; Constitutional: Well developed, Well nourished, Well hydrated, In no acute distress; Head and Face: Normocephalic, Atraumatic; Eyes: EOMI, PERRL, No scleral icterus; ENMT: Mouth and pharynx normal, Poor  dentition, Widespread dental decay and missing teeth. Mucous membranes moist, +upper right 2nd molar with extensive dental  decay.  No gingival erythema, edema, fluctuance, or drainage.  No intra-oral edema. No submandibular or sublingual edema. No hoarse voice, no drooling, no stridor. No trismus. ; ; Neck: Supple, Full range of motion, No lymphadenopathy; Cardiovascular: Regular rate and rhythm, No gallop; Respiratory: Breath sounds clear & equal bilaterally, No wheezes.  Speaking full sentences with ease, Normal respiratory effort/excursion; Chest: +left upper chest wall tender to palp. No soft tissue crepitus, no deformity. Movement normal; Abdomen: Soft, Nontender, Nondistended, Normal bowel sounds; Genitourinary: No CVA tenderness; Extremities: Peripheral pulses normal, No tenderness, No edema, No calf edema or asymmetry.; Neuro: AA&Ox3, Major CN grossly intact.  Speech clear. No gross focal motor or sensory deficits in extremities.; Skin: Color normal, Warm, Dry.   ED Treatments / Results  Labs (all labs ordered are listed, but only abnormal results are displayed)   EKG EKG Interpretation  Date/Time:  Monday January 13 2019 14:34:25 EST Ventricular Rate:  81 PR Interval:    QRS Duration: 97 QT Interval:  368 QTC Calculation: 428 R Axis:   22 Text Interpretation:  Sinus rhythm Inferior infarct, old When compared with ECG of 09/24/2018 No significant change was found Confirmed by Samuel JesterMcManus, Muntaha Vermette [id number]) on 01/13/2019 3:00:19 PM   Radiology   Procedures Procedures (including critical care time)  Medications Ordered in ED Medications  HYDROcodone-acetaminophen (NORCO/VICODIN) 5-325 MG per tablet 1 tablet (has no administration in time range)  ipratropium-albuterol (DUONEB) 0.5-2.5 (3) MG/3ML nebulizer solution 3 mL (has no administration in time range)     Initial Impression / Assessment and Plan / ED Course  I have reviewed the triage vital signs and the nursing  notes.  Pertinent labs & imaging results that were available during my care of the patient were reviewed by me and considered in my medical decision making (see chart for details).  MDM Reviewed: previous chart, nursing note and vitals Reviewed previous: labs and ECG Interpretation: labs, ECG, x-ray and CT scan    Results for orders placed or performed during the hospital encounter of 01/13/19  Basic metabolic panel  Result Value Ref Range   Sodium 142 135 - 145 mmol/L   Potassium 3.5 3.5 - 5.1 mmol/L   Chloride 106 98 - 111 mmol/L   CO2 27 22 - 32 mmol/L   Glucose, Bld 92 70 - 99 mg/dL   BUN 7 6 - 20 mg/dL   Creatinine, Ser 1.910.78 0.44 - 1.00 mg/dL   Calcium 9.4 8.9 - 47.810.3 mg/dL   GFR calc non Af Amer >60 >60 mL/min   GFR calc Af Amer >60 >60 mL/min   Anion gap 9 5 - 15  CBC  Result Value Ref Range   WBC 4.9 4.0 - 10.5 K/uL   RBC 5.26 (H) 3.87 - 5.11 MIL/uL   Hemoglobin 11.9 (L) 12.0 - 15.0 g/dL   HCT 29.542.2 62.136.0 - 30.846.0 %   MCV 80.2 80.0 - 100.0 fL   MCH 22.6 (L) 26.0 - 34.0 pg   MCHC 28.2 (L) 30.0 - 36.0 g/dL   RDW 65.718.6 (H) 84.611.5 - 96.215.5 %   Platelets 278 150 - 400 K/uL   nRBC 0.0 0.0 - 0.2 %  Troponin I - ONCE - STAT  Result Value Ref Range   Troponin I <0.03 <0.03 ng/mL  D-dimer, quantitative (not at [facility])  Result Value Ref Range   D-Dimer, Quant 0.48 0.00 - 0.50 ug/mL-FEU  Pregnancy, urine  Result Value Ref Range   Preg Test, Ur NEGATIVE NEGATIVE  Urinalysis,  Routine w reflex microscopic  Result Value Ref Range   Color, Urine YELLOW YELLOW   APPearance CLEAR CLEAR   Specific Gravity, Urine 1.019 1.005 - 1.030   pH 7.0 5.0 - 8.0   Glucose, UA NEGATIVE NEGATIVE mg/dL   Hgb urine dipstick NEGATIVE NEGATIVE   Bilirubin Urine NEGATIVE NEGATIVE   Ketones, ur NEGATIVE NEGATIVE mg/dL   Protein, ur NEGATIVE NEGATIVE mg/dL   Nitrite NEGATIVE NEGATIVE   Leukocytes,Ua NEGATIVE NEGATIVE  I-Stat beta hCG blood, ED  Result Value Ref Range   I-stat hCG, quantitative <5.0  <5 mIU/mL   Comment 3           Dg Chest 2 View Result Date: 01/13/2019 CLINICAL DATA:  Shortness of breath and left chest pain 1 month. Pulmonary embolism October 2019. EXAM: CHEST - 2 VIEW COMPARISON:  09/24/2018 FINDINGS: Lungs are hypoinflated without consolidation or effusion. Cardiomediastinal silhouette and remainder the exam is unchanged. IMPRESSION: Hypoinflation without acute cardiopulmonary disease. Electronically Signed   By: Elberta Fortis M.D.   On: 01/13/2019 11:09   Ct Angio Chest Pe W/cm &/or Wo Cm Result Date: 01/13/2019 CLINICAL DATA:  33 year old female with a history of chest pain and shortness of breath for 4 days EXAM: CT ANGIOGRAPHY CHEST WITH CONTRAST TECHNIQUE: Multidetector CT imaging of the chest was performed using the standard protocol during bolus administration of intravenous contrast. Multiplanar CT image reconstructions and MIPs were obtained to evaluate the vascular anatomy. CONTRAST:  ISOVUE-370 IOPAMIDOL (ISOVUE-370) INJECTION 76% COMPARISON:  CT 09/24/2018 FINDINGS: Cardiovascular: Heart: Heart size unchanged with cardiomegaly. No pericardial fluid/thickening. No significant coronary calcifications. Aorta: Unremarkable course, caliber, contour of the thoracic aorta. No aneurysm or dissection flap. No periaortic fluid. Pulmonary arteries: No central, lobar, segmental, or proximal subsegmental filling defects. Mediastinum/Nodes: No mediastinal adenopathy. Unremarkable appearance of the thoracic esophagus. Unremarkable appearance of the thoracic inlet and thyroid. Lungs/Pleura: Central airways are clear. No pleural effusion. No confluent airspace disease. No pneumothorax. Upper Abdomen: No acute. Musculoskeletal: No acute displaced fracture. Degenerative changes of the spine. Review of the MIP images confirms the above findings. IMPRESSION: No CT evidence of pulmonary emboli. No acute finding. Electronically Signed   By: Gilmer Mor D.O.   On: 01/13/2019 19:53     2015:  Workup reassuring. Doubt PE as cause for symptoms with normal CT-A chest.  Doubt ACS as cause for symptoms with normal troponin and unchanged EKG from previous after 3-4 days of constant atypical symptoms. Tx CP symptomatically at this time. Tx dental pain with abx, f/u DDS. Dx and testing d/w pt.  Questions answered.  Verb understanding, agreeable to d/c home with outpt f/u.     Final Clinical Impressions(s) / ED Diagnoses   Final diagnoses:  None    ED Discharge Orders    None       Samuel Jester, DO 01/17/19 1610

## 2019-01-14 ENCOUNTER — Other Ambulatory Visit: Payer: Self-pay

## 2019-01-14 DIAGNOSIS — I2694 Multiple subsegmental pulmonary emboli without acute cor pulmonale: Secondary | ICD-10-CM

## 2019-01-14 DIAGNOSIS — D649 Anemia, unspecified: Secondary | ICD-10-CM

## 2019-01-14 DIAGNOSIS — E538 Deficiency of other specified B group vitamins: Secondary | ICD-10-CM

## 2019-01-15 ENCOUNTER — Other Ambulatory Visit: Payer: Self-pay

## 2019-01-15 ENCOUNTER — Ambulatory Visit: Payer: Self-pay | Admitting: Hematology and Oncology

## 2019-01-15 ENCOUNTER — Other Ambulatory Visit: Payer: Self-pay | Admitting: Hematology and Oncology

## 2019-01-15 ENCOUNTER — Inpatient Hospital Stay: Payer: BLUE CROSS/BLUE SHIELD

## 2019-01-15 ENCOUNTER — Ambulatory Visit: Payer: BLUE CROSS/BLUE SHIELD

## 2019-01-15 ENCOUNTER — Inpatient Hospital Stay: Payer: BLUE CROSS/BLUE SHIELD | Attending: Hematology and Oncology | Admitting: Hematology and Oncology

## 2019-01-15 ENCOUNTER — Encounter: Payer: Self-pay | Admitting: Hematology and Oncology

## 2019-01-15 VITALS — BP 148/76 | HR 66 | Temp 98.2°F | Resp 18 | Ht 70.0 in | Wt 373.7 lb

## 2019-01-15 DIAGNOSIS — D6862 Lupus anticoagulant syndrome: Secondary | ICD-10-CM

## 2019-01-15 DIAGNOSIS — Z79899 Other long term (current) drug therapy: Secondary | ICD-10-CM | POA: Diagnosis not present

## 2019-01-15 DIAGNOSIS — E538 Deficiency of other specified B group vitamins: Secondary | ICD-10-CM | POA: Insufficient documentation

## 2019-01-15 DIAGNOSIS — R5383 Other fatigue: Secondary | ICD-10-CM | POA: Insufficient documentation

## 2019-01-15 DIAGNOSIS — I509 Heart failure, unspecified: Secondary | ICD-10-CM | POA: Insufficient documentation

## 2019-01-15 DIAGNOSIS — D649 Anemia, unspecified: Secondary | ICD-10-CM

## 2019-01-15 DIAGNOSIS — I2694 Multiple subsegmental pulmonary emboli without acute cor pulmonale: Secondary | ICD-10-CM

## 2019-01-15 DIAGNOSIS — Z7951 Long term (current) use of inhaled steroids: Secondary | ICD-10-CM | POA: Diagnosis not present

## 2019-01-15 DIAGNOSIS — D500 Iron deficiency anemia secondary to blood loss (chronic): Secondary | ICD-10-CM | POA: Insufficient documentation

## 2019-01-15 DIAGNOSIS — Z86711 Personal history of pulmonary embolism: Secondary | ICD-10-CM | POA: Diagnosis not present

## 2019-01-15 DIAGNOSIS — I110 Hypertensive heart disease with heart failure: Secondary | ICD-10-CM | POA: Diagnosis not present

## 2019-01-15 DIAGNOSIS — N920 Excessive and frequent menstruation with regular cycle: Secondary | ICD-10-CM | POA: Diagnosis not present

## 2019-01-15 DIAGNOSIS — Z791 Long term (current) use of non-steroidal anti-inflammatories (NSAID): Secondary | ICD-10-CM | POA: Insufficient documentation

## 2019-01-15 DIAGNOSIS — Z7901 Long term (current) use of anticoagulants: Secondary | ICD-10-CM

## 2019-01-15 LAB — CBC WITH DIFFERENTIAL/PLATELET
Abs Immature Granulocytes: 0.01 10*3/uL (ref 0.00–0.07)
Basophils Absolute: 0 10*3/uL (ref 0.0–0.1)
Basophils Relative: 0 %
Eosinophils Absolute: 0.1 10*3/uL (ref 0.0–0.5)
Eosinophils Relative: 2 %
HCT: 37.5 % (ref 36.0–46.0)
Hemoglobin: 11.2 g/dL — ABNORMAL LOW (ref 12.0–15.0)
Immature Granulocytes: 0 %
Lymphocytes Relative: 39 %
Lymphs Abs: 1.6 10*3/uL (ref 0.7–4.0)
MCH: 23.8 pg — ABNORMAL LOW (ref 26.0–34.0)
MCHC: 29.9 g/dL — ABNORMAL LOW (ref 30.0–36.0)
MCV: 79.6 fL — ABNORMAL LOW (ref 80.0–100.0)
Monocytes Absolute: 0.3 10*3/uL (ref 0.1–1.0)
Monocytes Relative: 6 %
Neutro Abs: 2.2 10*3/uL (ref 1.7–7.7)
Neutrophils Relative %: 53 %
Platelets: 247 10*3/uL (ref 150–400)
RBC: 4.71 MIL/uL (ref 3.87–5.11)
RDW: 18.3 % — ABNORMAL HIGH (ref 11.5–15.5)
WBC: 4.1 10*3/uL (ref 4.0–10.5)
nRBC: 0 % (ref 0.0–0.2)

## 2019-01-15 LAB — COMPREHENSIVE METABOLIC PANEL
ALT: 8 U/L (ref 0–44)
AST: 16 U/L (ref 15–41)
Albumin: 4.1 g/dL (ref 3.5–5.0)
Alkaline Phosphatase: 76 U/L (ref 38–126)
Anion gap: 7 (ref 5–15)
BUN: 8 mg/dL (ref 6–20)
CO2: 27 mmol/L (ref 22–32)
Calcium: 8.8 mg/dL — ABNORMAL LOW (ref 8.9–10.3)
Chloride: 106 mmol/L (ref 98–111)
Creatinine, Ser: 0.72 mg/dL (ref 0.44–1.00)
GFR calc Af Amer: >60 mL/min (ref >60)
GFR calc non Af Amer: >60 mL/min (ref >60)
Glucose, Bld: 140 mg/dL — ABNORMAL HIGH (ref 70–99)
Potassium: 3.6 mmol/L (ref 3.5–5.1)
Sodium: 140 mmol/L (ref 135–145)
Total Bilirubin: 0.5 mg/dL (ref 0.3–1.2)
Total Protein: 7.6 g/dL (ref 6.5–8.1)

## 2019-01-15 LAB — PREGNANCY, URINE: Preg Test, Ur: NEGATIVE

## 2019-01-15 LAB — ANTITHROMBIN III: AntiThromb III Func: 111 % (ref 75–120)

## 2019-01-15 MED ORDER — CYANOCOBALAMIN 1000 MCG/ML IJ SOLN
1000.0000 ug | Freq: Once | INTRAMUSCULAR | Status: AC
  Administered 2019-01-15: 1000 ug via INTRAMUSCULAR

## 2019-01-15 MED ORDER — CYANOCOBALAMIN 1000 MCG/ML IJ SOLN
INTRAMUSCULAR | Status: AC
  Filled 2019-01-15: qty 1

## 2019-01-15 NOTE — Progress Notes (Signed)
[facility]  Clinic day:  01/15/2019  Chief Complaint: Andrea Miller is a 33 y.o. female with pulmonary embolism, iron deficiency anemia secondary to menorrhagia, and B12 deficiency who is seen for 3 month assessment .  HPI:  The patient was last seen in the hematology clinic on 10/15/2018.  At that time, she felt fatigued.  She noted a little shortness of breath and dizziness.  Exam was stable.  She was on oral iron BID.  She requested Venofer.  B12 was 242 on 10/11/2018.  She started oral B12 on 11/04/2018.  She never received Venofer.  She initially had transportation problems.  She had concerns regarding cost.  She received B12 on 10/13/2018 and 10/28/2018.  She was seen in the [facility] ER on 09/24/2018 and 01/13/2019 with chest pain.  Chest CT angiogram revealed no pulmonary embolism.  CBC on 01/13/2019 revealed a hematocrit of 42.2, hemoglobin 11.9, MCV 80.2, platelets 278,000, and WBC 4900.  During the interim, she notes that it is hard to get out of bed.  Oral iron was increased from from BID to TIDlast week.  She takes oral iron with orange juice.  She notes ice pica.  She denies any bleeding.   Past Medical History:  Diagnosis Date  . Anxiety   . Asthma   . Bipolar disorder (HCC)   . Chest pain    and angina  . CHF (congestive heart failure) (HCC)   . Depression   . Fibromyalgia   . Hypertension   . 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 Obesity   . Pre-diabetes   . Pulmonary embolus (HCC)   . Seizures (HCC)    secondary to anxiety  last seizure 01/25/18  . SVT (supraventricular tachycardia) (HCC)    with hx of syncope; managed by [facility]    Past Surgical History:  Procedure Laterality Date  . CHOLECYSTECTOMY N/A 02/22/2018   Procedure: LAPAROSCOPIC CHOLECYSTECTOMY;  Surgeon: Ancil Linsey, MD;  Location: [facility] ORS;  Service: General;  Laterality: N/A;  . TONSILLECTOMY AND ADENOIDECTOMY N/A  09/05/2016   Procedure: TONSILLECTOMY AND ADENOIDECTOMY;  Surgeon: Linus Salmons, MD;  Location: [facility] ORS;  Service: ENT;  Laterality: N/A;    Family History  Problem Relation Age of Onset  . Diabetes Mother   . Hypertension Mother   . Asthma Mother   . Gallstones Mother   . Diabetes Father   . Hypertension Father   . Gallstones Father   . Bipolar disorder Sister   . COPD Brother   . Schizophrenia Brother   . COPD Brother   . Hypertension Brother     Social History:  reports that she has never smoked. She has never used smokeless tobacco. She reports that she does not drink alcohol or use drugs.  No tobacco, tobacco, or illegal subatance use. Prior to her back issues, patient was employed full time at [employer].   Allergies:  Allergies  Allergen Reactions  . Cortizone-10 [Hydrocortisone] Shortness Of Breath and Swelling  . Garlic Anaphylaxis  . Prednisone Swelling    Tongue swelling  . Corticosteroids Palpitations    Current Medications: Current Outpatient Medications  Medication Sig Dispense Refill  . budesonide-formoterol (SYMBICORT) 160-4.5 MCG/ACT inhaler Inhale 2 puffs into the lungs 2 (two) times daily. 1 Inhaler 10  . Calcium-Magnesium 500-250 MG TABS Take 1 tablet by mouth 2 (two) times daily.     . clonazePAM (KLONOPIN) 2 MG tablet  Take 2 mg by mouth 3 (three) times daily.     . cyanocobalamin (,VITAMIN B-12,) 1000 MCG/ML injection Inject 1,000 mcg into the muscle every 30 (thirty) days.    Marland Kitchen diltiazem (CARDIZEM CD) 360 MG 24 hr capsule Take 360 mg by mouth every morning.    . escitalopram (LEXAPRO) 20 MG tablet Take 20 mg by mouth daily.    . folic acid (FOLVITE) 1 MG tablet Take 1 mg by mouth daily.   10  . furosemide (LASIX) 20 MG tablet Take 40 mg by mouth 2 (two) times daily.     Marland Kitchen gabapentin (NEURONTIN) 400 MG capsule Take 400 mg by mouth 3 (three) times daily.    Marland Kitchen lamoTRIgine (LAMICTAL) 100 MG tablet Take 200 mg by mouth 2 (two) times daily.     Marland Kitchen  linaclotide (LINZESS) 290 MCG CAPS capsule Take 290 mcg by mouth daily before breakfast.    . montelukast (SINGULAIR) 10 MG tablet Take 1 tablet (10 mg total) by mouth at bedtime. 30 tablet 5  . Potassium Chloride ER 20 MEQ TBCR Take 20 mEq by mouth 2 (two) times daily.     . propranolol (INDERAL) 40 MG tablet Take 2 tablets (80 mg total) by mouth 3 (three) times daily. 90 tablet 0  . risperiDONE (RISPERDAL) 3 MG tablet Take 3 mg by mouth at bedtime.     . rivaroxaban (XARELTO) 20 MG TABS tablet Take 20 mg by mouth every morning.    . rosuvastatin (CRESTOR) 20 MG tablet Take 20 mg by mouth daily.    Marland Kitchen tiZANidine (ZANAFLEX) 4 MG tablet Take 4 mg by mouth 3 (three) times daily.   1  . Topiramate ER (TROKENDI XR) 200 MG CP24 Take 1 capsule by mouth daily.     . vitamin C (VITAMIN C) 250 MG tablet Take 1 tablet (250 mg total) by mouth 2 (two) times daily. 60 tablet 0  . albuterol (PROVENTIL) (2.5 MG/3ML) 0.083% nebulizer solution Take 2.5 mg by nebulization every 4 (four) hours as needed for wheezing or shortness of breath.    . Dexlansoprazole (DEXILANT) 30 MG capsule Take 30 mg by mouth daily.    . diclofenac sodium (VOLTAREN) 1 % GEL Apply 4 g topically daily as needed (for pain).    . fluticasone (FLONASE) 50 MCG/ACT nasal spray Place 2 sprays into both nostrils daily as needed for allergies.     . hydroxypropyl methylcellulose / hypromellose (ISOPTO TEARS / GONIOVISC) 2.5 % ophthalmic solution Place 1 drop into both eyes 3 (three) times daily as needed for dry eyes.    Marland Kitchen ibuprofen (ADVIL,MOTRIN) 600 MG tablet Take 600 mg by mouth every 8 (eight) hours as needed for mild pain or moderate pain.     . Iron-Vitamin C 65-125 MG TABS Take 1 tablet by mouth 2 (two) times daily. 60 tablet 1  . ketoconazole (NIZORAL) 2 % shampoo Apply 1 application topically every 30 (thirty) days.   1  . lactulose (CHRONULAC) 10 GM/15ML solution Take 30 g by mouth daily as needed for mild constipation.   5  . Prenatal  Vit-Fe Fumarate-FA (MULTIVITAMIN-PRENATAL) 27-0.8 MG TABS tablet Take 1 tablet by mouth daily at 12 noon.     No current facility-administered medications for this visit.     Review of Systems:  GENERAL:  Fatigue.  "hard to get out of bed".  No fevers, sweats.  Weight up 3 pounds. PERFORMANCE STATUS (ECOG):  1 HEENT:  No visual changes, runny nose,  sore throat, mouth sores or tenderness. Lungs: No shortness of breath or cough.  No hemoptysis. Cardiac:  No chest pain, palpitations, orthopnea, or PND. GI:  No nausea, vomiting, diarrhea, constipation, melena or hematochezia.  Ice pica. GU:  No urgency, frequency, dysuria, or hematuria. Musculoskeletal:  Disc problems in back.  No joint pain.  No muscle tenderness. Extremities:  No pain or swelling. Skin:  No rashes or skin changes. Neuro:  No headache, numbness or weakness, balance or coordination issues. Endocrine:  No diabetes, thyroid issues, hot flashes or night sweats. Psych:  No mood changes, depression or anxiety. Pain:  No focal pain. Review of systems:  All other systems reviewed and found to be negative.   Physical Exam: Blood pressure (!) 148/76, pulse 66, temperature 98.2 F (36.8 C), resp. rate 18, height 5\' 10"  (1.778 m), weight (!) 373 lb 10.9 oz (169.5 kg), last menstrual period 12/22/2018, SpO2 100 %. GENERAL:  Well developed, well nourished, heavyset woman sitting comfortably in the exam room in no acute distress. MENTAL STATUS:  Alert and oriented to person, place and time. HEAD:  Long braided hair.  Normocephalic, atraumatic, face symmetric, no Cushingoid features. EYES:  Brown eyes.  Pupils equal round and reactive to light and accomodation.  No conjunctivitis or scleral icterus. ENT:  Oropharynx clear without lesion.  Tongue normal. Mucous membranes moist.  RESPIRATORY:  Clear to auscultation without rales, wheezes or rhonchi. CARDIOVASCULAR:  Regular rate and rhythm without murmur, rub or gallop. ABDOMEN:  Fully  round.  Soft, non-tender, with active bowel sounds, and no hepatosplenomegaly.  No masses. SKIN:  No rashes, ulcers or lesions. EXTREMITIES: No edema, no skin discoloration or tenderness.  No palpable cords. LYMPH NODES: No palpable cervical, supraclavicular, axillary or inguinal adenopathy  NEUROLOGICAL: Unremarkable. PSYCH:  Appropriate.    Appointment on 01/15/2019  Component Date Value Ref Range Status  . Ferritin 01/15/2019 7* 11 - 307 ng/mL Final   Performed at [facility], [address] [place], [place] [address]  . Protein C, Total 01/15/2019 92  60 - 150 % Final   Comment: (NOTE) Performed At: [facility] [address] [place], [place] [id number] Jolene Schimke MD [id number]   . Protein S Ag, Total 01/15/2019 91  60 - 150 % Final   Comment: (NOTE) This test was developed and its performance characteristics determined by [facility]. It has not been cleared or approved by the Food and Drug Administration. Performed At: [facility] [address] [place], [place] [id number] Jolene Schimke MD [id number]   . AntiThromb III Func 01/15/2019 111  75 - 120 % Final   Performed at [facility], [address] [place], [place] [address]  . Protein C Activity 01/15/2019 113  73 - 180 % Final   Comment: (NOTE) Performed At: [facility] [address] [place], [place] [id number] Jolene Schimke MD [id number]   . Protein S Activity 01/15/2019 118  63 - 140 % Final   Comment: (NOTE) Protein S activity may be falsely increased (masking an abnormal, low result) in patients receiving direct Xa inhibitor (e.g., rivaroxaban, apixaban, edoxaban) or a direct thrombin inhibitor (e.g., dabigatran) anticoagulant treatment due to assay interference by these drugs. Performed At: [facility] [address] [place], [place] [id number] Jolene Schimke MD [id number]   . Sodium 01/15/2019 140  135 - 145 mmol/L Final  . Potassium  01/15/2019 3.6  3.5 - 5.1 mmol/L Final  . Chloride 01/15/2019 106  98 - 111  mmol/L Final  . CO2 01/15/2019 27  22 - 32 mmol/L Final  . Glucose, Bld 01/15/2019 140* 70 - 99 mg/dL Final  . BUN 32/35/5732 8  6 - 20 mg/dL Final  . Creatinine, Ser 01/15/2019 0.72  0.44 - 1.00 mg/dL Final  . Calcium 20/25/4270 8.8* 8.9 - 10.3 mg/dL Final  . Total Protein 01/15/2019 7.6  6.5 - 8.1 g/dL Final  . Albumin 62/37/6283 4.1  3.5 - 5.0 g/dL Final  . AST 15/17/6160 16  15 - 41 U/L Final  . ALT 01/15/2019 8  0 - 44 U/L Final  . Alkaline Phosphatase 01/15/2019 76  38 - 126 U/L Final  . Total Bilirubin 01/15/2019 0.5  0.3 - 1.2 mg/dL Final  . GFR calc non Af Amer 01/15/2019 >60  >60 mL/min Final  . GFR calc Af Amer 01/15/2019 >60  >60 mL/min Final  . Anion gap 01/15/2019 7  5 - 15 Final   Performed at [facility], [address] [place], [place] [address]  . WBC 01/15/2019 4.1  4.0 - 10.5 K/uL Final  . RBC 01/15/2019 4.71  3.87 - 5.11 MIL/uL Final  . Hemoglobin 01/15/2019 11.2* 12.0 - 15.0 g/dL Final  . HCT 62/69/4854 37.5  36.0 - 46.0 % Final  . MCV 01/15/2019 79.6* 80.0 - 100.0 fL Final  . MCH 01/15/2019 23.8* 26.0 - 34.0 pg Final  . MCHC 01/15/2019 29.9* 30.0 - 36.0 g/dL Final  . RDW 62/70/3500 18.3* 11.5 - 15.5 % Final  . Platelets 01/15/2019 247  150 - 400 K/uL Final  . nRBC 01/15/2019 0.0  0.0 - 0.2 % Final  . Neutrophils Relative % 01/15/2019 53  % Final  . Neutro Abs 01/15/2019 2.2  1.7 - 7.7 K/uL Final  . Lymphocytes Relative 01/15/2019 39  % Final  . Lymphs Abs 01/15/2019 1.6  0.7 - 4.0 K/uL Final  . Monocytes Relative 01/15/2019 6  % Final  . Monocytes Absolute 01/15/2019 0.3  0.1 - 1.0 K/uL Final  . Eosinophils Relative 01/15/2019 2  % Final  . Eosinophils Absolute 01/15/2019 0.1  0.0 - 0.5 K/uL Final  . Basophils Relative 01/15/2019 0  % Final  . Basophils Absolute 01/15/2019 0.0  0.0 - 0.1 K/uL Final  . Immature Granulocytes 01/15/2019 0  % Final  . Abs Immature  Granulocytes 01/15/2019 0.01  0.00 - 0.07 K/uL Final   Performed at [facility], [address], [place], [place] [address]  . Preg Test, Ur 01/15/2019 NEGATIVE  NEGATIVE Final   Performed at [facility], [address] [place], [place] [address]  Admission on 01/13/2019, Discharged on 01/13/2019  Component Date Value Ref Range Status  . Sodium 01/13/2019 142  135 - 145 mmol/L Final  . Potassium 01/13/2019 3.5  3.5 - 5.1 mmol/L Final  . Chloride 01/13/2019 106  98 - 111 mmol/L Final  . CO2 01/13/2019 27  22 - 32 mmol/L Final  . Glucose, Bld 01/13/2019 92  70 - 99 mg/dL Final  . BUN 16/96/7893 7  6 - 20 mg/dL Final  . Creatinine, Ser 01/13/2019 0.78  0.44 - 1.00 mg/dL Final  . Calcium 81/11/7508 9.4  8.9 - 10.3 mg/dL Final  . GFR calc non Af Amer 01/13/2019 >60  >60 mL/min Final  . GFR calc Af Amer 01/13/2019 >60  >60 mL/min Final  . Anion gap 01/13/2019 9  5 - 15 Final   Performed at [facility], [address] [place], [place] [address]  . WBC 01/13/2019 4.9  4.0 - 10.5 K/uL Final  . RBC 01/13/2019 5.26* 3.87 - 5.11 MIL/uL Final  . Hemoglobin 01/13/2019 11.9* 12.0 - 15.0 g/dL Final  . HCT 95/62/130802/17/2020 42.2  36.0 - 46.0 % Final  . MCV 01/13/2019 80.2  80.0 - 100.0 fL Final  . MCH 01/13/2019 22.6* 26.0 - 34.0 pg Final  . MCHC 01/13/2019 28.2* 30.0 - 36.0 g/dL Final  . RDW 65/78/469602/17/2020 18.6* 11.5 - 15.5 % Final  . Platelets 01/13/2019 278  150 - 400 K/uL Final  . nRBC 01/13/2019 0.0  0.0 - 0.2 % Final   Performed at [facility], [address] [place], [place] [address]  . Troponin I 01/13/2019 <0.03  <0.03 ng/mL Final   Performed at [facility], [address] [place], [place] [address]  . D-Dimer, Quant 01/13/2019 0.48  0.00 - 0.50 ug/mL-FEU Final   Comment: (NOTE) At the manufacturer cut-off of 0.50 ug/mL FEU, this assay has been documented to exclude PE with a sensitivity and negative predictive value of 97 to 99%.  At this time, this assay has not  been approved by the FDA to exclude DVT/VTE. Results should be correlated with clinical presentation. Performed at [facility], [address] [place], [place] [address]   . Preg Test, Ur 01/13/2019 NEGATIVE  NEGATIVE Final   Comment:        THE SENSITIVITY OF THIS METHODOLOGY IS >20 mIU/mL. Performed at [facility], [address] [place], [place] [address]   . Color, Urine 01/13/2019 YELLOW  YELLOW Final  . APPearance 01/13/2019 CLEAR  CLEAR Final  . Specific Gravity, Urine 01/13/2019 1.019  1.005 - 1.030 Final  . pH 01/13/2019 7.0  5.0 - 8.0 Final  . Glucose, UA 01/13/2019 NEGATIVE  NEGATIVE mg/dL Final  . Hgb urine dipstick 01/13/2019 NEGATIVE  NEGATIVE Final  . Bilirubin Urine 01/13/2019 NEGATIVE  NEGATIVE Final  . Ketones, ur 01/13/2019 NEGATIVE  NEGATIVE mg/dL Final  . Protein, ur 64/40/347402/17/2020 NEGATIVE  NEGATIVE mg/dL Final  . Nitrite 25/95/638702/17/2020 NEGATIVE  NEGATIVE Final  . Glori LuisLeukocytes,Ua 01/13/2019 NEGATIVE  NEGATIVE Final   Performed at [facility], [address] [place], [place] [address]  . I-stat hCG, quantitative 01/13/2019 <5.0  <5 mIU/mL Final  . Comment 3 01/13/2019          Final   Comment:   GEST. AGE      CONC.  (mIU/mL)   <=1 WEEK        5 - 50     2 WEEKS       50 - 500     3 WEEKS       100 - 10,000     4 WEEKS     1,000 - 30,000        FEMALE AND NON-PREGNANT FEMALE:     LESS THAN 5 mIU/mL     Assessment:  Andrea Miller is a 33 y.o. female with small bilateral pulmonary emboli.  Risk factors for thrombosis include obesity and birth control pills.  Chest CT angiogram on 09/15/2018 revealed small bilateral pulmonary emboli and mild cardiomegaly.  Bilateral lower extremity duplex on 09/16/2018 revealed no evidence of lower extremity DVT.    Hypercoagulable work-up on 10/11/2018 revealed the following negative studies: factor V Leiden, prothrombin gene mutation, anti-cardiolipin antibodies, and beta-2 glycoprotein antibodies.  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  She has iron deficiency anemia.She notes a recent history of  extremely heavy mensesx 3 weeks (none in past 2 weeks). Hemoglobin was 10.5 on 10/29/2019and 6.5 on 10/10/2018. Work-up on 10/10/2018 revealed a ferritinof 4, iron saturation 3%. She hasice pica.  She received 3 units of PRBCs.  She is on oral iron.  Ferritin has been followed: 4 on 10/11/2018 and 7 on 01/15/2019.  She has B12 deficiency. B12 was 242 on 10/10/2018. She began B12 injections on 10/13/2018 (last 10/28/2018).  Diet has been protein shakes and Weight Watcher's.  Symptomatically, she remains fatigued.  She has ice pica. Hemoglobin is 11.2.  Ferritin is 7.  Plan: 1.   Labs today:  CBC with diff, CMP, protein C, protein S, and ATIII, ferritin. 2.  Iron deficiency anemia Hemoglobin 11.2.  Ferritin 7. Fatigue and ice pica persists. Patient has been on oral iron. Discuss patient's thoughts about IV iron. Weekly Venofer x 3.  Patient consents. 3.   B12 deficiency Discuss low B12 on 10/10/2018. Discuss monthly B12 (last 10/28/2018). B12 today and monthly. 4.   Menorrhagia Patient has a history of heavy menses. Follow-up with gynecology. 5.   Pulmonary embolism Patient remains on Xarelto. Etiology of thrombosis felt secondary to weight and birth control pills Initial hypercoagulable work-up revealed a lupus anticoagulant (? affected by Xarelto).  6.   RTC in 6 weeks for labs (CBC with diff, ferritin). 7.   RTC in 3 months for MD assessment, labs (CBC with diff, ferritin, lupus anticoagulant panel- day before), and +/- Venofer.  I discussed the assessment and treatment plan with the patient.  The patient was provided an opportunity to ask questions and all were answered.  The patient agreed with the plan and demonstrated an understanding of the instructions.  The patient was advised to call back or seek an in person evaluation if the symptoms worsen or if the condition fails to  improve as anticipated.    Rosey Bath, MD  01/15/2019, 4:30 PM

## 2019-01-15 NOTE — Progress Notes (Signed)
Patient was instructed to hydrate well before she returns for venofer injections.  Patient voiced understanding.

## 2019-01-15 NOTE — Progress Notes (Signed)
No new changes noted today 

## 2019-01-15 NOTE — Patient Instructions (Signed)
Cyanocobalamin, Vitamin B12 injection What is this medicine? CYANOCOBALAMIN (sye an oh koe BAL a min) is a man made form of vitamin B12. Vitamin B12 is used in the growth of healthy blood cells, nerve cells, and proteins in the body. It also helps with the metabolism of fats and carbohydrates. This medicine is used to treat people who can not absorb vitamin B12. This medicine may be used for other purposes; ask your health care provider or pharmacist if you have questions. COMMON BRAND NAME(S): B-12 Compliance Kit, B-12 Injection Kit, Cyomin, LA-12, Nutri-Twelve, Physicians EZ Use B-12, Primabalt What should I tell my health care provider before I take this medicine? They need to know if you have any of these conditions: -kidney disease -Leber's disease -megaloblastic anemia -an unusual or allergic reaction to cyanocobalamin, cobalt, other medicines, foods, dyes, or preservatives -pregnant or trying to get pregnant -breast-feeding How should I use this medicine? This medicine is injected into a muscle or deeply under the skin. It is usually given by a health care professional in a clinic or doctor's office. However, your doctor may teach you how to inject yourself. Follow all instructions. Talk to your pediatrician regarding the use of this medicine in children. Special care may be needed. Overdosage: If you think you have taken too much of this medicine contact a poison control center or emergency room at once. NOTE: This medicine is only for you. Do not share this medicine with others. What if I miss a dose? If you are given your dose at a clinic or doctor's office, call to reschedule your appointment. If you give your own injections and you miss a dose, take it as soon as you can. If it is almost time for your next dose, take only that dose. Do not take double or extra doses. What may interact with this medicine? -colchicine -heavy alcohol intake This list may not describe all possible  interactions. Give your health care provider a list of all the medicines, herbs, non-prescription drugs, or dietary supplements you use. Also tell them if you smoke, drink alcohol, or use illegal drugs. Some items may interact with your medicine. What should I watch for while using this medicine? Visit your doctor or health care professional regularly. You may need blood work done while you are taking this medicine. You may need to follow a special diet. Talk to your doctor. Limit your alcohol intake and avoid smoking to get the best benefit. What side effects may I notice from receiving this medicine? Side effects that you should report to your doctor or health care professional as soon as possible: -allergic reactions like skin rash, itching or hives, swelling of the face, lips, or tongue -blue tint to skin -chest tightness, pain -difficulty breathing, wheezing -dizziness -red, swollen painful area on the leg Side effects that usually do not require medical attention (report to your doctor or health care professional if they continue or are bothersome): -diarrhea -headache This list may not describe all possible side effects. Call your doctor for medical advice about side effects. You may report side effects to FDA at 1-800-FDA-1088. Where should I keep my medicine? Keep out of the reach of children. Store at room temperature between 15 and 30 degrees C (59 and 85 degrees F). Protect from light. Throw away any unused medicine after the expiration date. NOTE: This sheet is a summary. It may not cover all possible information. If you have questions about this medicine, talk to your doctor, pharmacist, or   health care provider.  2019 Elsevier/Gold Standard (2008-02-24 22:10:20)  

## 2019-01-16 ENCOUNTER — Ambulatory Visit: Payer: Self-pay | Admitting: Family

## 2019-01-16 LAB — PROTEIN C ACTIVITY: Protein C Activity: 113 % (ref 73–180)

## 2019-01-16 LAB — FERRITIN: Ferritin: 7 ng/mL — ABNORMAL LOW (ref 11–307)

## 2019-01-16 LAB — PROTEIN S, TOTAL: Protein S Ag, Total: 91 % (ref 60–150)

## 2019-01-16 LAB — PROTEIN S ACTIVITY: Protein S Activity: 118 % (ref 63–140)

## 2019-01-17 LAB — PROTEIN C, TOTAL: Protein C, Total: 92 % (ref 60–150)

## 2019-01-20 ENCOUNTER — Inpatient Hospital Stay: Payer: BLUE CROSS/BLUE SHIELD

## 2019-01-22 NOTE — Progress Notes (Deleted)
Subjective:    Patient ID: Andrea Miller, female    DOB: 11/16/1986, 33 y.o.   MRN: [id number]  HPI  Ms Trabue is a 33 y/o female with a history of asthma, HTN, SVT, obesity, bipolar, anxiety, depression, fibromyalgia, seizures, bulging disc and chronic heart failure.   Echo report from 09/16/18 reviewed and showed an EF of 55-60%. Echo report from 07/04/18 reviewed and showed an EF of 73%. Echo report from 04/15/17 reviewed and showed an EF of 60-65%.  Since she was last here September 2019 (5 months ago), she has been admitted twice and to the ED 4 times. Most recent ED visit was 01/13/2019.   She presents today for a follow-up visit with a chief complaint of   Past Medical History:  Diagnosis Date  . Anxiety   . Asthma   . Bipolar disorder (HCC)   . Chest pain    and angina  . CHF (congestive heart failure) (HCC)   . Depression   . Fibromyalgia   . Hypertension   . 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 Obesity   . Pre-diabetes   . Pulmonary embolus (HCC)   . Seizures (HCC)    secondary to anxiety  last seizure 01/25/18  . SVT (supraventricular tachycardia) (HCC)    with hx of syncope; managed by [facility]   Past Surgical History:  Procedure Laterality Date  . CHOLECYSTECTOMY N/A 02/22/2018   Procedure: LAPAROSCOPIC CHOLECYSTECTOMY;  Surgeon: Ancil Linsey, MD;  Location: [facility] ORS;  Service: General;  Laterality: N/A;  . TONSILLECTOMY AND ADENOIDECTOMY N/A 09/05/2016   Procedure: TONSILLECTOMY AND ADENOIDECTOMY;  Surgeon: Linus Salmons, MD;  Location: [facility] ORS;  Service: ENT;  Laterality: N/A;   Family History  Problem Relation Age of Onset  . Diabetes Mother   . Hypertension Mother   . Asthma Mother   . Gallstones Mother   . Diabetes Father   . Hypertension Father   . Gallstones Father   . Bipolar disorder Sister   . COPD Brother   . Schizophrenia Brother   . COPD Brother   . Hypertension Brother    Social History    Tobacco Use  . Smoking status: Never Smoker  . Smokeless tobacco: Never Used  Substance Use Topics  . Alcohol use: No   Allergies  Allergen Reactions  . Cortizone-10 [Hydrocortisone] Shortness Of Breath and Swelling  . Garlic Anaphylaxis  . Prednisone Swelling    Tongue swelling  . Corticosteroids Palpitations     Review of Systems  Constitutional: Positive for fatigue. Negative for appetite change.  HENT: Negative for congestion, postnasal drip and sore throat.   Eyes: Negative.   Respiratory: Positive for shortness of breath. Negative for cough.   Cardiovascular: Positive for chest pain (intermittent), palpitations (at times) and leg swelling (better, now just in the foot).  Gastrointestinal: Negative for abdominal distention and abdominal pain.  Endocrine: Negative.   Genitourinary: Negative.   Musculoskeletal: Positive for arthralgias (leg pain) and back pain (due to bulging disc).  Skin: Negative.   Allergic/Immunologic: Negative.   Neurological: Positive for light-headedness and headaches. Negative for dizziness.  Hematological: Negative for adenopathy. Does not bruise/bleed easily.  Psychiatric/Behavioral: Positive for dysphoric mood. Negative for sleep disturbance (wearing CPAP at night). The patient is not nervous/anxious.         Objective:   Physical Exam Vitals signs and nursing note reviewed.  Constitutional:      Appearance: She is  well-developed.  HENT:     Head: Normocephalic and atraumatic.  Neck:     Musculoskeletal: Normal range of motion and neck supple.     Vascular: No JVD.  Cardiovascular:     Rate and Rhythm: Normal rate and regular rhythm.  Pulmonary:     Effort: Pulmonary effort is normal. No respiratory distress.     Breath sounds: No wheezing or rales.  Abdominal:     General: There is no distension.     Palpations: Abdomen is soft.  Musculoskeletal:     Right lower leg: She exhibits no tenderness. Edema (1+ pitting) present.      Left lower leg: She exhibits no tenderness. Edema (1+ pitting) present.  Skin:    General: Skin is warm and dry.  Neurological:     Mental Status: She is alert and oriented to person, place, and time.  Psychiatric:        Behavior: Behavior normal.       Assessment & Plan:   1: Chronic heart failure with preserved ejection fraction- - NYHA class III - euvolemic today - weighing daily; reminded to call for an overnight weight gain of >2 pounds or a weekly weight gain of >5 pounds - weight d - not adding salt and occasionally reads food labels; reviewed the importance of closely following a 2000mg  sodium diet  - saw cardiology Welton Flakes) 07/04/18  - saw pulmonologist Sung Amabile) 07/31/18 - drinking ~ 64 ounces of fluid daily - BNP 06/29/18 was 49.0 - had labs done at [facility] so will request those results  2: HTN- - BP  - saw PCP Letta Pate) at [facility] last week - BMP 01/15/2019 reviewed and showed sodium 140, potassium 3.6, creatinine 0.72 and GFR >60  3: Obstructive sleep apnea- - wearing CPAP nightly and feels like she's sleeping well  4: Lymphedema- - stage 2 - elevating legs some during the day - limited in her ability to exercise due to disc in her back causing pain - wearing compression socks but edema persists - will make a referral for lymphapress compression boots   Patient did not bring her medications nor a list. Each medication was verbally reviewed with the patient and she was encouraged to bring the bottles to every visit to confirm accuracy of list.

## 2019-01-23 ENCOUNTER — Ambulatory Visit: Payer: BLUE CROSS/BLUE SHIELD | Admitting: Family

## 2019-01-23 ENCOUNTER — Inpatient Hospital Stay: Payer: BLUE CROSS/BLUE SHIELD

## 2019-01-28 NOTE — Progress Notes (Deleted)
Subjective:    Patient ID: Andrea Miller, female    DOB: July 27, 1986, 33 y.o.   MRN: [id number]  HPI  Andrea Miller is a 33 y/o female with a history of asthma, HTN, SVT, obesity, bipolar, anxiety, depression, fibromyalgia, seizures, bulging disc and chronic heart failure.   Echo report from 09/16/18 reviewed and showed an EF of 55-60%. Echo report from 07/04/18 reviewed and showed an EF of 73%. Echo report from 04/15/17 reviewed and showed an EF of 60-65%.  Since she was last here September 2019 (6 months ago), she has been admitted twice and to the ED 4 times. Most recent ED visit was 01/13/2019.   She presents today for a follow-up visit with a chief complaint of   Past Medical History:  Diagnosis Date  . Anxiety   . Asthma   . Bipolar disorder (HCC)   . Chest pain    and angina  . CHF (congestive heart failure) (HCC)   . Depression   . Fibromyalgia   . Hypertension   . 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 Obesity   . Pre-diabetes   . Pulmonary embolus (HCC)   . Seizures (HCC)    secondary to anxiety  last seizure 01/25/18  . SVT (supraventricular tachycardia) (HCC)    with hx of syncope; managed by [facility]   Past Surgical History:  Procedure Laterality Date  . CHOLECYSTECTOMY N/A 02/22/2018   Procedure: LAPAROSCOPIC CHOLECYSTECTOMY;  Surgeon: Ancil Linsey, MD;  Location: [facility] ORS;  Service: General;  Laterality: N/A;  . TONSILLECTOMY AND ADENOIDECTOMY N/A 09/05/2016   Procedure: TONSILLECTOMY AND ADENOIDECTOMY;  Surgeon: Linus Salmons, MD;  Location: [facility] ORS;  Service: ENT;  Laterality: N/A;   Family History  Problem Relation Age of Onset  . Diabetes Mother   . Hypertension Mother   . Asthma Mother   . Gallstones Mother   . Diabetes Father   . Hypertension Father   . Gallstones Father   . Bipolar disorder Sister   . COPD Brother   . Schizophrenia Brother   . COPD Brother   . Hypertension Brother    Social History    Tobacco Use  . Smoking status: Never Smoker  . Smokeless tobacco: Never Used  Substance Use Topics  . Alcohol use: No   Allergies  Allergen Reactions  . Cortizone-10 [Hydrocortisone] Shortness Of Breath and Swelling  . Garlic Anaphylaxis  . Prednisone Swelling    Tongue swelling  . Corticosteroids Palpitations     Review of Systems  Constitutional: Positive for fatigue. Negative for appetite change.  HENT: Negative for congestion, postnasal drip and sore throat.   Eyes: Negative.   Respiratory: Positive for shortness of breath. Negative for cough.   Cardiovascular: Positive for chest pain (intermittent), palpitations (at times) and leg swelling (better, now just in the foot).  Gastrointestinal: Negative for abdominal distention and abdominal pain.  Endocrine: Negative.   Genitourinary: Negative.   Musculoskeletal: Positive for arthralgias (leg pain) and back pain (due to bulging disc).  Skin: Negative.   Allergic/Immunologic: Negative.   Neurological: Positive for light-headedness and headaches. Negative for dizziness.  Hematological: Negative for adenopathy. Does not bruise/bleed easily.  Psychiatric/Behavioral: Positive for dysphoric mood. Negative for sleep disturbance (wearing CPAP at night). The patient is not nervous/anxious.         Objective:   Physical Exam Vitals signs and nursing note reviewed.  Constitutional:      Appearance: She is  well-developed.  HENT:     Head: Normocephalic and atraumatic.  Neck:     Musculoskeletal: Normal range of motion and neck supple.     Vascular: No JVD.  Cardiovascular:     Rate and Rhythm: Normal rate and regular rhythm.  Pulmonary:     Effort: Pulmonary effort is normal. No respiratory distress.     Breath sounds: No wheezing or rales.  Abdominal:     General: There is no distension.     Palpations: Abdomen is soft.  Musculoskeletal:     Right lower leg: She exhibits no tenderness. Edema (1+ pitting) present.      Left lower leg: She exhibits no tenderness. Edema (1+ pitting) present.  Skin:    General: Skin is warm and dry.  Neurological:     Mental Status: She is alert and oriented to person, place, and time.  Psychiatric:        Behavior: Behavior normal.       Assessment & Plan:   1: Chronic heart failure with preserved ejection fraction- - NYHA class III - euvolemic today - weighing daily; reminded to call for an overnight weight gain of >2 pounds or a weekly weight gain of >5 pounds - weight d - not adding salt and occasionally reads food labels; reviewed the importance of closely following a 2000mg  sodium diet  - saw cardiology Welton Flakes) 07/04/18  - saw pulmonologist Sung Amabile) 07/31/18 - drinking ~ 64 ounces of fluid daily - BNP 06/29/18 was 49.0 - had labs done at [facility] so will request those results  2: HTN- - BP  - saw PCP Letta Pate) at [facility] last week - BMP 01/15/2019 reviewed and showed sodium 140, potassium 3.6, creatinine 0.72 and GFR >60  3: Obstructive sleep apnea- - wearing CPAP nightly and feels like she's sleeping well  4: Lymphedema- - stage 2 - elevating legs some during the day - limited in her ability to exercise due to disc in her back causing pain - wearing compression socks but edema persists - will make a referral for lymphapress compression boots   Patient did not bring her medications nor a list. Each medication was verbally reviewed with the patient and she was encouraged to bring the bottles to every visit to confirm accuracy of list.

## 2019-01-30 ENCOUNTER — Ambulatory Visit: Payer: BLUE CROSS/BLUE SHIELD | Admitting: Family

## 2019-01-30 ENCOUNTER — Inpatient Hospital Stay: Payer: BLUE CROSS/BLUE SHIELD

## 2019-01-30 ENCOUNTER — Inpatient Hospital Stay: Payer: BLUE CROSS/BLUE SHIELD | Attending: Hematology and Oncology

## 2019-01-30 VITALS — BP 142/97 | HR 87 | Temp 97.9°F | Resp 18

## 2019-01-30 DIAGNOSIS — D500 Iron deficiency anemia secondary to blood loss (chronic): Secondary | ICD-10-CM

## 2019-01-30 DIAGNOSIS — E538 Deficiency of other specified B group vitamins: Secondary | ICD-10-CM

## 2019-01-30 MED ORDER — IRON SUCROSE 20 MG/ML IV SOLN: 200.0000 mg | Freq: Once | INTRAVENOUS | Status: DC

## 2019-01-30 MED ORDER — IRON-VITAMIN C 65-125 MG PO TABS: 1.0000 | ORAL_TABLET | Freq: Two times a day (BID) | ORAL | 1 refills | Status: DC

## 2019-01-30 MED ORDER — SODIUM CHLORIDE 0.9 % IV SOLN
Freq: Once | INTRAVENOUS | Status: DC
  Filled 2019-01-30: qty 250
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
## 2019-01-30 NOTE — Progress Notes (Signed)
Multiple unsuccessful attempts to gain IV access for peripheral need of iron infusion.  NP aware and he spoke with patient who has agreed to try an oral medication.

## 2019-01-31 ENCOUNTER — Other Ambulatory Visit: Payer: Self-pay

## 2019-01-31 ENCOUNTER — Ambulatory Visit: Payer: BLUE CROSS/BLUE SHIELD | Admitting: Family

## 2019-01-31 ENCOUNTER — Emergency Department
Admission: EM | Admit: 2019-01-31 | Discharge: 2019-01-31 | Disposition: A | Discharge status: Home / Self Care | Payer: BLUE CROSS/BLUE SHIELD | Attending: Emergency Medicine | Admitting: Emergency Medicine

## 2019-01-31 ENCOUNTER — Encounter: Payer: Self-pay | Admitting: Emergency Medicine

## 2019-01-31 ENCOUNTER — Telehealth: Payer: Self-pay | Admitting: Family

## 2019-01-31 DIAGNOSIS — R05 Cough: Secondary | ICD-10-CM | POA: Diagnosis not present

## 2019-01-31 DIAGNOSIS — J111 Influenza due to unidentified influenza virus with other respiratory manifestations: Secondary | ICD-10-CM | POA: Diagnosis not present

## 2019-01-31 DIAGNOSIS — I509 Heart failure, unspecified: Secondary | ICD-10-CM | POA: Insufficient documentation

## 2019-01-31 DIAGNOSIS — I110 Hypertensive heart disease with heart failure: Secondary | ICD-10-CM | POA: Insufficient documentation

## 2019-01-31 DIAGNOSIS — E119 Type 2 diabetes mellitus without complications: Secondary | ICD-10-CM | POA: Insufficient documentation

## 2019-01-31 DIAGNOSIS — R109 Unspecified abdominal pain: Secondary | ICD-10-CM | POA: Diagnosis present

## 2019-01-31 DIAGNOSIS — Z7901 Long term (current) use of anticoagulants: Secondary | ICD-10-CM | POA: Insufficient documentation

## 2019-01-31 DIAGNOSIS — J45909 Unspecified asthma, uncomplicated: Secondary | ICD-10-CM | POA: Insufficient documentation

## 2019-01-31 DIAGNOSIS — R69 Illness, unspecified: Secondary | ICD-10-CM

## 2019-01-31 DIAGNOSIS — Z79899 Other long term (current) drug therapy: Secondary | ICD-10-CM | POA: Diagnosis not present

## 2019-01-31 LAB — COMPREHENSIVE METABOLIC PANEL
ALT: 9 U/L (ref 0–44)
AST: 16 U/L (ref 15–41)
Albumin: 4.4 g/dL (ref 3.5–5.0)
Alkaline Phosphatase: 63 U/L (ref 38–126)
Anion gap: 10 (ref 5–15)
BUN: 8 mg/dL (ref 6–20)
CO2: 26 mmol/L (ref 22–32)
Calcium: 9.5 mg/dL (ref 8.9–10.3)
Chloride: 105 mmol/L (ref 98–111)
Creatinine, Ser: 0.69 mg/dL (ref 0.44–1.00)
GFR calc Af Amer: >60 mL/min (ref >60)
GFR calc non Af Amer: >60 mL/min (ref >60)
Glucose, Bld: 104 mg/dL — ABNORMAL HIGH (ref 70–99)
Potassium: 3.8 mmol/L (ref 3.5–5.1)
Sodium: 141 mmol/L (ref 135–145)
Total Bilirubin: 0.7 mg/dL (ref 0.3–1.2)
Total Protein: 7.9 g/dL (ref 6.5–8.1)

## 2019-01-31 LAB — LIPASE, BLOOD: Lipase: 27 U/L (ref 11–51)

## 2019-01-31 LAB — CBC
HCT: 40.4 % (ref 36.0–46.0)
Hemoglobin: 12.2 g/dL (ref 12.0–15.0)
MCH: 24.1 pg — ABNORMAL LOW (ref 26.0–34.0)
MCHC: 30.2 g/dL (ref 30.0–36.0)
MCV: 79.7 fL — ABNORMAL LOW (ref 80.0–100.0)
Platelets: 276 10*3/uL (ref 150–400)
RBC: 5.07 MIL/uL (ref 3.87–5.11)
RDW: 20.4 % — ABNORMAL HIGH (ref 11.5–15.5)
WBC: 4.4 10*3/uL (ref 4.0–10.5)
nRBC: 0 % (ref 0.0–0.2)

## 2019-01-31 MED ORDER — ACETAMINOPHEN 325 MG PO TABS
650.0000 mg | ORAL_TABLET | Freq: Once | ORAL | Status: AC
  Administered 2019-01-31: 650 mg via ORAL

## 2019-01-31 MED ORDER — ACETAMINOPHEN 325 MG PO TABS
ORAL_TABLET | ORAL | Status: AC
  Filled 2019-01-31: qty 2

## 2019-01-31 MED ORDER — LIDOCAINE VISCOUS HCL 2 % MT SOLN: 15.0000 mL | Freq: Four times a day (QID) | OROMUCOSAL | 0 refills | Status: DC | PRN

## 2019-01-31 MED ORDER — SODIUM CHLORIDE 0.9% FLUSH: 3.0000 mL | Freq: Once | INTRAVENOUS | Status: DC

## 2019-01-31 MED ORDER — LIDOCAINE VISCOUS HCL 2 % MT SOLN
15.0000 mL | Freq: Once | OROMUCOSAL | Status: AC
  Administered 2019-01-31: 15 mL via OROMUCOSAL
  Filled 2019-01-31: qty 15

## 2019-01-31 MED ORDER — ONDANSETRON 4 MG PO TBDP
ORAL_TABLET | ORAL | Status: AC
  Administered 2019-01-31: 4 mg via ORAL
  Filled 2019-01-31: qty 1

## 2019-01-31 MED ORDER — ONDANSETRON 4 MG PO TBDP
4.0000 mg | ORAL_TABLET | Freq: Once | ORAL | Status: AC
  Administered 2019-01-31: 4 mg via ORAL

## 2019-01-31 MED ORDER — IPRATROPIUM-ALBUTEROL 0.5-2.5 (3) MG/3ML IN SOLN
3.0000 mL | Freq: Once | RESPIRATORY_TRACT | Status: AC
  Administered 2019-01-31: 3 mL via RESPIRATORY_TRACT
  Filled 2019-01-31: qty 3

## 2019-01-31 MED ORDER — OSELTAMIVIR PHOSPHATE 75 MG PO CAPS: 75.0000 mg | ORAL_CAPSULE | Freq: Two times a day (BID) | ORAL | 0 refills | Status: DC

## 2019-01-31 MED ORDER — OSELTAMIVIR PHOSPHATE 75 MG PO CAPS: 75.0000 mg | ORAL_CAPSULE | Freq: Two times a day (BID) | ORAL | 0 refills | Status: AC

## 2019-01-31 NOTE — Telephone Encounter (Signed)
Patient did not show for her [facility] appointment on 01/31/2019. Will attempt to reschedule.

## 2019-01-31 NOTE — ED Notes (Signed)
Pt given 1 warm blanket and apple juice x2 per request.

## 2019-01-31 NOTE — ED Provider Notes (Addendum)
[facility] Emergency Department Provider Note   ____________________________________________    I have reviewed the triage vital signs and the nursing notes.   HISTORY  Chief Complaint Abdominal Pain     HPI Andrea Miller is a 33 y.o. female who presents with complaints of sore throat, mild cough, intermittent headaches, body aches, mild nausea.  Apparently sent to the emergency department by urgent care for evaluation.  She denies shortness of breath although she reports her lungs do feel "a little tight ".  Past Medical History:  Diagnosis Date  . Anxiety   . Asthma   . Bipolar disorder (HCC)   . Chest pain    and angina  . CHF (congestive heart failure) (HCC)   . Depression   . Fibromyalgia   . Hypertension   . 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 Obesity   . Pre-diabetes   . Pulmonary embolus (HCC)   . Seizures (HCC)    secondary to anxiety  last seizure 01/25/18  . SVT (supraventricular tachycardia) (HCC)    with hx of syncope; managed by [facility]    Patient Active Problem List   Diagnosis Date Noted  . Iron deficiency anemia due to chronic blood loss 10/15/2018  . B12 deficiency 10/13/2018  . Symptomatic anemia 10/11/2018  . Multiple subsegmental pulmonary emboli without acute cor pulmonale 09/15/2018  . Morbid obesity with BMI of 50.0-59.9, adult (HCC) 09/15/2018  . Chronic diastolic heart failure (HCC) 07/01/2018  . Lymphedema 07/01/2018  . Chronic cholecystitis   . Edema 02/13/2018  . Diabetes mellitus type 2, uncomplicated (HCC) 02/13/2018  . Hypertension 11/23/2017  . Asthma exacerbation 04/14/2017  . Asthma without status asthmaticus 01/16/2017  . Bipolar affective (HCC) 01/16/2017  . Coronary artery disease 01/16/2017  . Syncope 12/12/2016  . Fibromyalgia 04/27/2014  . OSA (obstructive sleep apnea) 04/27/2014  . Seizure-like activity (HCC) 04/27/2014  . Migraine without  status migrainosus, not intractable 04/27/2014  . Obesity 04/15/2014  . Sleep disorder 04/15/2014  . Neck pain 04/15/2014  . Headache 04/15/2014  . Restless legs syndrome 04/15/2014  . Supraventricular tachycardia (HCC) 07/13/2009    Past Surgical History:  Procedure Laterality Date  . CHOLECYSTECTOMY N/A 02/22/2018   Procedure: LAPAROSCOPIC CHOLECYSTECTOMY;  Surgeon: Ancil Linsey, MD;  Location: [facility] ORS;  Service: General;  Laterality: N/A;  . TONSILLECTOMY AND ADENOIDECTOMY N/A 09/05/2016   Procedure: TONSILLECTOMY AND ADENOIDECTOMY;  Surgeon: Linus Salmons, MD;  Location: [facility] ORS;  Service: ENT;  Laterality: N/A;    Prior to Admission medications   Medication Sig Start Date End Date Taking? Authorizing Provider  albuterol (PROVENTIL HFA;VENTOLIN HFA) 108 (90 Base) MCG/ACT inhaler Inhale 2 puffs into the lungs every 6 (six) hours as needed for wheezing or shortness of breath. Patient not taking: Reported on 01/15/2019 07/31/18   Merwyn Katos, MD  albuterol (PROVENTIL) (2.5 MG/3ML) 0.083% nebulizer solution Take 2.5 mg by nebulization every 4 (four) hours as needed for wheezing or shortness of breath.    [provider]  budesonide-formoterol (SYMBICORT) 160-4.5 MCG/ACT inhaler Inhale 2 puffs into the lungs 2 (two) times daily. 10/15/18   Shane Crutch, MD  Calcium-Magnesium 500-250 MG TABS Take 1 tablet by mouth 2 (two) times daily.     [provider]  clindamycin (CLEOCIN) 150 MG capsule 3 tabs PO TID x 10 days Patient not taking: Reported on 01/15/2019 01/13/19   Samuel Jester, DO  clonazePAM Scarlette Calico) 2  MG tablet Take 2 mg by mouth 3 (three) times daily.     [provider]  cyanocobalamin (,VITAMIN B-12,) 1000 MCG/ML injection Inject 1,000 mcg into the muscle every 30 (thirty) days.    [provider]  diclofenac sodium (VOLTAREN) 1 % GEL Apply 4 g topically daily as needed (for pain).    [provider]  diltiazem  (CARDIZEM CD) 360 MG 24 hr capsule Take 360 mg by mouth every morning. 12/18/18   [provider]  escitalopram (LEXAPRO) 20 MG tablet Take 20 mg by mouth daily.    [provider]  fluticasone (FLONASE) 50 MCG/ACT nasal spray Place 2 sprays into both nostrils daily as needed for allergies.     [provider]  folic acid (FOLVITE) 1 MG tablet Take 1 mg by mouth daily.  12/15/16   [provider]  furosemide (LASIX) 20 MG tablet Take 40 mg by mouth 2 (two) times daily.     [provider]  gabapentin (NEURONTIN) 400 MG capsule Take 400 mg by mouth 3 (three) times daily.    [provider]  HYDROcodone-acetaminophen (NORCO/VICODIN) 5-325 MG tablet 1 or 2 tabs PO q8 hours prn pain 01/13/19   Samuel Jester, DO  hydroxypropyl methylcellulose / hypromellose (ISOPTO TEARS / GONIOVISC) 2.5 % ophthalmic solution Place 1 drop into both eyes 3 (three) times daily as needed for dry eyes.    [provider]  ibuprofen (ADVIL,MOTRIN) 600 MG tablet Take 600 mg by mouth every 8 (eight) hours as needed for mild pain or moderate pain.  12/25/18   [provider]  Iron-Vitamin C 65-125 MG TABS Take 1 tablet by mouth 2 (two) times daily. 01/30/19   Verlee Monte, NP  ketoconazole (NIZORAL) 2 % shampoo Apply 1 application topically every 30 (thirty) days.  10/01/18   [provider]  lactulose (CHRONULAC) 10 GM/15ML solution Take 30 g by mouth daily as needed for mild constipation.  08/28/18   [provider]  lamoTRIgine (LAMICTAL) 100 MG tablet Take 200 mg by mouth 2 (two) times daily.     [provider]  lidocaine (XYLOCAINE) 2 % solution Use as directed 15 mLs in the mouth or throat every 6 (six) hours as needed for mouth pain. 01/31/19   Jene Every, MD  linaclotide [facility]) 290 MCG CAPS capsule Take 290 mcg by mouth daily before breakfast.    [provider]  methocarbamol (ROBAXIN) 500 MG tablet Take 2  tablets (1,000 mg total) by mouth 4 (four) times daily as needed for muscle spasms (muscle spasm/pain). 01/13/19   Samuel Jester, DO  montelukast (SINGULAIR) 10 MG tablet Take 1 tablet (10 mg total) by mouth at bedtime. 10/15/18   Shane Crutch, MD  omeprazole (PRILOSEC) 20 MG capsule Take 20 mg by mouth daily. 12/18/18   [provider]  oseltamivir (TAMIFLU) 75 MG capsule Take 1 capsule (75 mg total) by mouth 2 (two) times daily for 5 days. 01/31/19 02/05/19  Jene Every, MD  Potassium Chloride ER 20 MEQ TBCR Take 20 mEq by mouth 2 (two) times daily.     [provider]  Prenatal Vit-Fe Fumarate-FA (MULTIVITAMIN-PRENATAL) 27-0.8 MG TABS tablet Take 1 tablet by mouth daily at 12 noon.    [provider]  propranolol (INDERAL) 40 MG tablet Take 2 tablets (80 mg total) by mouth 3 (three) times daily. 10/13/18   Salary, Evelena Asa, MD  risperiDONE (RISPERDAL) 3 MG tablet Take 3 mg by mouth  at bedtime.     [provider]  rivaroxaban (XARELTO) 20 MG TABS tablet Take 20 mg by mouth every morning.    [provider]  rosuvastatin (CRESTOR) 20 MG tablet Take 20 mg by mouth daily.    [provider]  tiZANidine (ZANAFLEX) 4 MG tablet Take 4 mg by mouth 3 (three) times daily.  03/26/17   [provider]  Topiramate ER (TROKENDI XR) 200 MG CP24 Take 1 capsule by mouth daily.     [provider]  vitamin C (VITAMIN C) 250 MG tablet Take 1 tablet (250 mg total) by mouth 2 (two) times daily. 10/13/18   Salary, Evelena Asa, MD     Allergies Cortizone-10 [hydrocortisone]; Garlic; Prednisone; and Corticosteroids  Family History  Problem Relation Age of Onset  . Diabetes Mother   . Hypertension Mother   . Asthma Mother   . Gallstones Mother   . Diabetes Father   . Hypertension Father   . Gallstones Father   . Bipolar disorder Sister   . COPD Brother   . Schizophrenia Brother   . COPD Brother   . Hypertension Brother      Social History Social History   Tobacco Use  . Smoking status: Never Smoker  . Smokeless tobacco: Never Used  Substance Use Topics  . Alcohol use: No  . Drug use: No    Review of Systems  Constitutional: No fever/chills Eyes: No visual changes.  ENT: As above Cardiovascular: Denies chest pain. Respiratory: As above Gastrointestinal: No abdominal pain.   Genitourinary: Negative for dysuria. Musculoskeletal: Negative for back pain.  Myalgias Skin: Negative for rash. Neurological: As above   ____________________________________________   PHYSICAL EXAM:  VITAL SIGNS: ED Triage Vitals  Enc Vitals Group     BP 01/31/19 1147 137/81     Pulse Rate 01/31/19 1145 77     Resp 01/31/19 1145 16     Temp 01/31/19 1145 99.6 F (37.6 C)     Temp Source 01/31/19 1145 Oral     SpO2 01/31/19 1145 100 %     Weight 01/31/19 1146 (!) 161.9 kg (357 lb)     Height 01/31/19 1146 1.778 m (5\' 10" )     Head Circumference --      Peak Flow --      Pain Score 01/31/19 1146 10     Pain Loc --      Pain Edu? --      Excl. in GC? --     Constitutional: Alert and oriented. Eyes: Conjunctivae are normal.   Nose: No congestion/rhinnorhea. Mouth/Throat: Mucous membranes are moist.  Pharynx unremarkable  Cardiovascular: Normal rate, regular rhythm. Grossly normal heart sounds.  Good peripheral circulation. Respiratory: Normal respiratory effort.  No retractions. Lungs CTAB. Gastrointestinal: Soft and nontender. No distention.   Musculoskeletal:   Warm and well perfused Neurologic:  Normal speech and language. No gross focal neurologic deficits are appreciated.  Skin:  Skin is warm, dry and intact. No rash noted. Psychiatric: Mood and affect are normal. Speech and behavior are normal.  ____________________________________________   LABS (all labs ordered are listed, but only abnormal results are displayed)  Labs Reviewed  COMPREHENSIVE METABOLIC PANEL - Abnormal; Notable for the  following components:      Result Value   Glucose, Bld 104 (*)    All other components within normal limits  CBC - Abnormal; Notable for the following components:   MCV 79.7 (*)    MCH 24.1 (*)  RDW 20.4 (*)    All other components within normal limits  LIPASE, BLOOD  URINALYSIS, COMPLETE (UACMP) WITH MICROSCOPIC  POC URINE PREG, ED   ____________________________________________  EKG  ED ECG REPORT I, Jene Every, the attending physician, personally viewed and interpreted this ECG.  Date: 02/07/2019  Rhythm: normal sinus rhythm QRS Axis: normal Intervals: normal ST/T Wave abnormalities: normal Narrative Interpretation: no evidence of acute ischemia  ____________________________________________  RADIOLOGY   ____________________________________________   PROCEDURES  Procedure(s) performed: No  Procedures   Critical Care performed: No ____________________________________________   INITIAL IMPRESSION / ASSESSMENT AND PLAN / ED COURSE  Pertinent labs & imaging results that were available during my care of the patient were reviewed by me and considered in my medical decision making (see chart for details).  Patient well-appearing and in no acute distress, reassuring exam, mild scattered wheezes treated with DuoNeb, Zofran, viscous lidocaine for sore throat.  Will treat with Tamiflu, outpatient follow-up recommended    ____________________________________________   FINAL CLINICAL IMPRESSION(S) / ED DIAGNOSES  Final diagnoses:  Influenza-like illness        Note:  This document was prepared using Dragon voice recognition software and may include unintentional dictation errors.   Jene Every, MD 01/31/19 1506    Jene Every, MD 02/07/19 [phone]

## 2019-01-31 NOTE — ED Triage Notes (Signed)
Pt to ED via POV c/o bilateral upper abdominal pain, body aches, possible fever, chills, headache, N/V, sore throat chest pain, shortness of breath, left ear pain, and fatigue. Pt does not appear to be in any distress at this time.

## 2019-01-31 NOTE — ED Notes (Signed)
Had pt attempt for urine sample but did not try; makes loud vomiting noises but produces no vomit into toilet. Requesting additional meds for nausea.

## 2019-02-06 ENCOUNTER — Inpatient Hospital Stay: Payer: BLUE CROSS/BLUE SHIELD

## 2019-02-12 ENCOUNTER — Inpatient Hospital Stay: Payer: BLUE CROSS/BLUE SHIELD

## 2019-02-26 ENCOUNTER — Inpatient Hospital Stay: Payer: Medicare Other

## 2019-03-05 ENCOUNTER — Other Ambulatory Visit: Payer: Self-pay

## 2019-03-06 ENCOUNTER — Inpatient Hospital Stay: Payer: Medicare Other | Attending: Hematology and Oncology

## 2019-03-12 ENCOUNTER — Inpatient Hospital Stay: Payer: Medicare Other

## 2019-04-07 ENCOUNTER — Encounter (HOSPITAL_COMMUNITY): Payer: Self-pay | Admitting: *Deleted

## 2019-04-07 ENCOUNTER — Other Ambulatory Visit: Payer: Self-pay

## 2019-04-07 ENCOUNTER — Emergency Department (HOSPITAL_COMMUNITY)
Admission: EM | Admit: 2019-04-07 | Discharge: 2019-04-07 | Disposition: A | Discharge status: Home / Self Care | Payer: BLUE CROSS/BLUE SHIELD | Attending: Emergency Medicine | Admitting: Emergency Medicine

## 2019-04-07 DIAGNOSIS — Z79899 Other long term (current) drug therapy: Secondary | ICD-10-CM | POA: Insufficient documentation

## 2019-04-07 DIAGNOSIS — I110 Hypertensive heart disease with heart failure: Secondary | ICD-10-CM | POA: Diagnosis not present

## 2019-04-07 DIAGNOSIS — K0889 Other specified disorders of teeth and supporting structures: Secondary | ICD-10-CM

## 2019-04-07 DIAGNOSIS — I509 Heart failure, unspecified: Secondary | ICD-10-CM | POA: Insufficient documentation

## 2019-04-07 MED ORDER — PENICILLIN V POTASSIUM 500 MG PO TABS: 500.0000 mg | ORAL_TABLET | Freq: Four times a day (QID) | ORAL | 0 refills | Status: AC

## 2019-04-07 MED ORDER — PENICILLIN V POTASSIUM 250 MG PO TABS
500.0000 mg | ORAL_TABLET | Freq: Once | ORAL | Status: AC
  Administered 2019-04-07: 500 mg via ORAL
  Filled 2019-04-07: qty 2

## 2019-04-07 MED ORDER — HYDROCODONE-ACETAMINOPHEN 5-325 MG PO TABS
1.0000 | ORAL_TABLET | Freq: Once | ORAL | Status: AC
  Administered 2019-04-07: 1 via ORAL
  Filled 2019-04-07: qty 1

## 2019-04-07 MED ORDER — HYDROCODONE-ACETAMINOPHEN 5-325 MG PO TABS: 1.0000 | ORAL_TABLET | Freq: Four times a day (QID) | ORAL | 0 refills | Status: DC | PRN

## 2019-04-07 NOTE — ED Triage Notes (Signed)
Pt c/o right side jaw, tooth and ear pain x 2 days; pt states she has a cracked tooth; pt has swelling to right side of face and pt states the pain radiates all over her head

## 2019-04-07 NOTE — ED Provider Notes (Signed)
[facility] EMERGENCY DEPARTMENT Provider Note   CSN: [id number] Arrival date & time: 04/07/19  0124    History   Chief Complaint Chief Complaint  Patient presents with   Dental Pain    HPI Andrea Miller is a 33 y.o. female.     Patient complains of a toothache.  The history is provided by the patient. No language interpreter was used.  Dental Pain  Location:  Upper Upper teeth location: Right upper tooth. Quality:  Aching Severity:  Moderate Onset quality:  Sudden Timing:  Constant Progression:  Worsening Chronicity:  New Context: not abscess   Previous work-up:  Dental exam Associated symptoms: no congestion and no headaches     Past Medical History:  Diagnosis Date   Anxiety    Asthma    Bipolar disorder (HCC)    Chest pain    and angina   CHF (congestive heart failure) (HCC)    Depression    Fibromyalgia    Hypertension    Left lumbar radiculopathy since 08/2014   secondary to work accident   Obesity    Pre-diabetes    Pulmonary embolus (HCC)    Seizures (HCC)    secondary to anxiety  last seizure 01/25/18   SVT (supraventricular tachycardia) (HCC)    with hx of syncope; managed by [facility]    Patient Active Problem List   Diagnosis Date Noted   Iron deficiency anemia due to chronic blood loss 10/15/2018   B12 deficiency 10/13/2018   Symptomatic anemia 10/11/2018   Multiple subsegmental pulmonary emboli without acute cor pulmonale 09/15/2018   Morbid obesity with BMI of 50.0-59.9, adult (HCC) 09/15/2018   Chronic diastolic heart failure (HCC) 07/01/2018   Lymphedema 07/01/2018   Chronic cholecystitis    Edema 02/13/2018   Diabetes mellitus type 2, uncomplicated (HCC) 02/13/2018   Hypertension 11/23/2017   Asthma exacerbation 04/14/2017   Asthma without status asthmaticus 01/16/2017   Bipolar affective (HCC) 01/16/2017   Coronary artery disease 01/16/2017   Syncope 12/12/2016   Fibromyalgia 04/27/2014     OSA (obstructive sleep apnea) 04/27/2014   Seizure-like activity (HCC) 04/27/2014   Migraine without status migrainosus, not intractable 04/27/2014   Obesity 04/15/2014   Sleep disorder 04/15/2014   Neck pain 04/15/2014   Headache 04/15/2014   Restless legs syndrome 04/15/2014   Supraventricular tachycardia (HCC) 07/13/2009    Past Surgical History:  Procedure Laterality Date   CHOLECYSTECTOMY N/A 02/22/2018   Procedure: LAPAROSCOPIC CHOLECYSTECTOMY;  Surgeon: Ancil Linsey, MD;  Location: [facility] ORS;  Service: General;  Laterality: N/A;   TONSILLECTOMY AND ADENOIDECTOMY N/A 09/05/2016   Procedure: TONSILLECTOMY AND ADENOIDECTOMY;  Surgeon: Linus Salmons, MD;  Location: [facility] ORS;  Service: ENT;  Laterality: N/A;     OB History    Gravida  0   Para  0   Term  0   Preterm  0   AB  0   Living  0     SAB  0   TAB  0   Ectopic  0   Multiple  0   Live Births               Home Medications    Prior to Admission medications   Medication Sig Start Date End Date Taking? Authorizing Provider  albuterol (PROVENTIL HFA;VENTOLIN HFA) 108 (90 Base) MCG/ACT inhaler Inhale 2 puffs into the lungs every 6 (six) hours as needed for wheezing or shortness of breath. Patient not taking: Reported on 01/15/2019 07/31/18  Merwyn Katos, MD  albuterol (PROVENTIL) (2.5 MG/3ML) 0.083% nebulizer solution Take 2.5 mg by nebulization every 4 (four) hours as needed for wheezing or shortness of breath.    [provider]  budesonide-formoterol (SYMBICORT) 160-4.5 MCG/ACT inhaler Inhale 2 puffs into the lungs 2 (two) times daily. 10/15/18   Shane Crutch, MD  Calcium-Magnesium 500-250 MG TABS Take 1 tablet by mouth 2 (two) times daily.     [provider]  clindamycin (CLEOCIN) 150 MG capsule 3 tabs PO TID x 10 days Patient not taking: Reported on 01/15/2019 01/13/19   Samuel Jester, DO  clonazePAM (KLONOPIN) 2 MG tablet Take 2 mg by mouth 3  (three) times daily.     [provider]  cyanocobalamin (,VITAMIN B-12,) 1000 MCG/ML injection Inject 1,000 mcg into the muscle every 30 (thirty) days.    [provider]  diclofenac sodium (VOLTAREN) 1 % GEL Apply 4 g topically daily as needed (for pain).    [provider]  diltiazem (CARDIZEM CD) 360 MG 24 hr capsule Take 360 mg by mouth every morning. 12/18/18   [provider]  escitalopram (LEXAPRO) 20 MG tablet Take 20 mg by mouth daily.    [provider]  fluticasone (FLONASE) 50 MCG/ACT nasal spray Place 2 sprays into both nostrils daily as needed for allergies.     [provider]  folic acid (FOLVITE) 1 MG tablet Take 1 mg by mouth daily.  12/15/16   [provider]  furosemide (LASIX) 20 MG tablet Take 40 mg by mouth 2 (two) times daily.     [provider]  gabapentin (NEURONTIN) 400 MG capsule Take 400 mg by mouth 3 (three) times daily.    [provider]  HYDROcodone-acetaminophen (NORCO/VICODIN) 5-325 MG tablet Take 1 tablet by mouth every 6 (six) hours as needed for moderate pain. 04/07/19   Bethann Berkshire, MD  hydroxypropyl methylcellulose / hypromellose (ISOPTO TEARS / GONIOVISC) 2.5 % ophthalmic solution Place 1 drop into both eyes 3 (three) times daily as needed for dry eyes.    [provider]  ibuprofen (ADVIL,MOTRIN) 600 MG tablet Take 600 mg by mouth every 8 (eight) hours as needed for mild pain or moderate pain.  12/25/18   [provider]  Iron-Vitamin C 65-125 MG TABS Take 1 tablet by mouth 2 (two) times daily. 01/30/19   Verlee Monte, NP  ketoconazole (NIZORAL) 2 % shampoo Apply 1 application topically every 30 (thirty) days.  10/01/18   [provider]  lactulose (CHRONULAC) 10 GM/15ML solution Take 30 g by mouth daily as needed for mild constipation.  08/28/18   [provider]  lamoTRIgine (LAMICTAL) 100 MG tablet Take 200 mg by mouth 2 (two) times daily.      [provider]  lidocaine (XYLOCAINE) 2 % solution Use as directed 15 mLs in the mouth or throat every 6 (six) hours as needed for mouth pain. 01/31/19   Jene Every, MD  linaclotide [facility]) 290 MCG CAPS capsule Take 290 mcg by mouth daily before breakfast.    [provider]  methocarbamol (ROBAXIN) 500 MG tablet Take 2 tablets (1,000 mg total) by mouth 4 (four) times daily as needed for muscle spasms (muscle spasm/pain). 01/13/19   Samuel Jester, DO  montelukast (SINGULAIR) 10 MG tablet Take 1 tablet (10 mg total) by mouth at bedtime. 10/15/18   Shane Crutch, MD  omeprazole (PRILOSEC) 20 MG capsule Take 20 mg by mouth daily. 12/18/18   [provider]  penicillin v potassium (VEETID) 500 MG tablet Take 1 tablet (500 mg total) by mouth 4 (four) times daily for 7 days. 04/07/19 04/14/19  Bethann Berkshire, MD  Potassium Chloride ER 20 MEQ TBCR Take 20 mEq by mouth 2 (two) times daily.     [provider]  Prenatal Vit-Fe Fumarate-FA (MULTIVITAMIN-PRENATAL) 27-0.8 MG TABS tablet Take 1 tablet by mouth daily at 12 noon.    [provider]  propranolol (INDERAL) 40 MG tablet Take 2 tablets (80 mg total) by mouth 3 (three) times daily. 10/13/18   Salary, Evelena Asa, MD  risperiDONE (RISPERDAL) 3 MG tablet Take 3 mg by mouth at bedtime.     [provider]  rivaroxaban (XARELTO) 20 MG TABS tablet Take 20 mg by mouth every morning.    [provider]  rosuvastatin (CRESTOR) 20 MG tablet Take 20 mg by mouth daily.    [provider]  tiZANidine (ZANAFLEX) 4 MG tablet Take 4 mg by mouth 3 (three) times daily.  03/26/17   [provider]  Topiramate ER (TROKENDI XR) 200 MG CP24 Take 1 capsule by mouth daily.     [provider]  vitamin C (VITAMIN C) 250 MG tablet Take 1 tablet (250 mg total) by mouth 2 (two) times daily. 10/13/18   Salary, Evelena Asa, MD    Family History Family History  Problem Relation Age  of Onset   Diabetes Mother    Hypertension Mother    Asthma Mother    Gallstones Mother    Diabetes Father    Hypertension Father    Gallstones Father    Bipolar disorder Sister    COPD Brother    Schizophrenia Brother    COPD Brother    Hypertension Brother     Social History Social History   Tobacco Use   Smoking status: Never Smoker   Smokeless tobacco: Never Used  Substance Use Topics   Alcohol use: No   Drug use: No     Allergies   Cortizone-10 [hydrocortisone]; Garlic; Prednisone; and Corticosteroids   Review of Systems Review of Systems  Constitutional: Negative for appetite change and fatigue.  HENT: Negative for congestion, ear discharge and sinus pressure.        Toothache  Eyes: Negative for discharge.  Respiratory: Negative for cough.   Cardiovascular: Negative for chest pain.  Gastrointestinal: Negative for abdominal pain and diarrhea.  Genitourinary: Negative for frequency and hematuria.  Musculoskeletal: Negative for back pain.  Skin: Negative for rash.  Neurological: Negative for seizures and headaches.  Psychiatric/Behavioral: Negative for hallucinations.     Physical Exam Updated Vital Signs BP (!) 153/91 (BP Location: Left Arm)    Pulse 98    Temp 98.8 F (37.1 C) (Oral)    Resp 16    Ht  (1.778 m)    Wt (!) 159.7 kg    LMP 03/17/2019    SpO2 100%    BMI 50.51 kg/m   Physical Exam Vitals signs and nursing note reviewed.  Constitutional:      Appearance: She is well-developed.  HENT:     Head: Normocephalic.     Comments: Tender left upper tooth    Nose: Nose normal.  Eyes:     General: No scleral icterus.    Conjunctiva/sclera: Conjunctivae normal.  Neck:     Musculoskeletal: Neck supple.     Thyroid: No thyromegaly.  Cardiovascular:     Rate and Rhythm: Normal rate and regular  rhythm.     Heart sounds: No murmur. No friction rub. No gallop.   Pulmonary:     Breath sounds: No stridor. No wheezing or  rales.  Chest:     Chest wall: No tenderness.  Abdominal:     General: There is no distension.     Tenderness: There is no abdominal tenderness. There is no rebound.  Musculoskeletal: Normal range of motion.  Lymphadenopathy:     Cervical: No cervical adenopathy.  Skin:    Findings: No erythema or rash.  Neurological:     Mental Status: She is oriented to person, place, and time.     Motor: No abnormal muscle tone.     Coordination: Coordination normal.  Psychiatric:        Behavior: Behavior normal.      ED Treatments / Results  Labs (all labs ordered are listed, but only abnormal results are displayed) Labs Reviewed - No data to display  EKG None  Radiology No results found.  Procedures Procedures (including critical care time)  Medications Ordered in ED Medications  penicillin v potassium (VEETID) tablet 500 mg (has no administration in time range)  HYDROcodone-acetaminophen (NORCO/VICODIN) 5-325 MG per tablet 1 tablet (has no administration in time range)     Initial Impression / Assessment and Plan / ED Course  I have reviewed the triage vital signs and the nursing notes.  Pertinent labs & imaging results that were available during my care of the patient were reviewed by me and considered in my medical decision making (see chart for details).        Patient with a toothache.  And tooth abscess.  She is placed on penicillin and Vicodin and follow-up with a dentist  Final Clinical Impressions(s) / ED Diagnoses   Final diagnoses:  Pain, dental    ED Discharge Orders         Ordered    penicillin v potassium (VEETID) 500 MG tablet  4 times daily     04/07/19 0210    HYDROcodone-acetaminophen (NORCO/VICODIN) 5-325 MG tablet  Every 6 hours PRN     04/07/19 0210           Bethann Berkshire, MD 04/07/19 [phone]

## 2019-04-07 NOTE — Discharge Instructions (Signed)
Follow up with a dentist

## 2019-04-08 ENCOUNTER — Other Ambulatory Visit: Payer: Self-pay

## 2019-04-09 ENCOUNTER — Inpatient Hospital Stay: Payer: Medicare Other

## 2019-04-15 ENCOUNTER — Other Ambulatory Visit: Payer: Self-pay

## 2019-04-15 ENCOUNTER — Emergency Department: Payer: Medicare Other

## 2019-04-15 ENCOUNTER — Emergency Department
Admission: EM | Admit: 2019-04-15 | Discharge: 2019-04-15 | Disposition: A | Discharge status: Home / Self Care | Payer: Medicare Other | Attending: Emergency Medicine | Admitting: Emergency Medicine

## 2019-04-15 DIAGNOSIS — J45909 Unspecified asthma, uncomplicated: Secondary | ICD-10-CM | POA: Insufficient documentation

## 2019-04-15 DIAGNOSIS — S199XXA Unspecified injury of neck, initial encounter: Secondary | ICD-10-CM | POA: Diagnosis present

## 2019-04-15 DIAGNOSIS — S161XXA Strain of muscle, fascia and tendon at neck level, initial encounter: Secondary | ICD-10-CM | POA: Diagnosis not present

## 2019-04-15 DIAGNOSIS — Y998 Other external cause status: Secondary | ICD-10-CM | POA: Diagnosis not present

## 2019-04-15 DIAGNOSIS — Y9389 Activity, other specified: Secondary | ICD-10-CM | POA: Insufficient documentation

## 2019-04-15 DIAGNOSIS — Y92410 Unspecified street and highway as the place of occurrence of the external cause: Secondary | ICD-10-CM | POA: Diagnosis not present

## 2019-04-15 DIAGNOSIS — I5032 Chronic diastolic (congestive) heart failure: Secondary | ICD-10-CM | POA: Insufficient documentation

## 2019-04-15 DIAGNOSIS — M7918 Myalgia, other site: Secondary | ICD-10-CM | POA: Insufficient documentation

## 2019-04-15 DIAGNOSIS — I110 Hypertensive heart disease with heart failure: Secondary | ICD-10-CM | POA: Insufficient documentation

## 2019-04-15 DIAGNOSIS — Z7901 Long term (current) use of anticoagulants: Secondary | ICD-10-CM | POA: Diagnosis not present

## 2019-04-15 DIAGNOSIS — Z79899 Other long term (current) drug therapy: Secondary | ICD-10-CM | POA: Insufficient documentation
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
## 2019-04-16 ENCOUNTER — Inpatient Hospital Stay: Payer: Medicare Other

## 2019-04-16 DIAGNOSIS — D509 Iron deficiency anemia, unspecified: Secondary | ICD-10-CM | POA: Diagnosis present

## 2019-04-16 LAB — PREGNANCY, URINE: Preg Test, Ur: NEGATIVE

## 2019-04-17 ENCOUNTER — Inpatient Hospital Stay: Payer: Medicare Other | Admitting: Hematology and Oncology

## 2019-04-17 ENCOUNTER — Inpatient Hospital Stay: Payer: Medicare Other | Attending: Hematology and Oncology

## 2019-04-17 ENCOUNTER — Inpatient Hospital Stay: Payer: Medicare Other

## 2019-04-21 ENCOUNTER — Other Ambulatory Visit: Payer: Self-pay | Admitting: Hematology and Oncology

## 2019-04-23 ENCOUNTER — Other Ambulatory Visit: Payer: Medicare Other

## 2019-04-23 ENCOUNTER — Ambulatory Visit: Payer: Medicare Other | Admitting: Hematology and Oncology

## 2019-04-23 ENCOUNTER — Ambulatory Visit: Payer: Medicare Other

## 2019-04-24 ENCOUNTER — Other Ambulatory Visit: Payer: Medicare Other

## 2019-04-24 ENCOUNTER — Inpatient Hospital Stay: Payer: Medicare Other | Attending: Hematology and Oncology

## 2019-04-24 ENCOUNTER — Other Ambulatory Visit: Payer: Self-pay

## 2019-04-24 DIAGNOSIS — D500 Iron deficiency anemia secondary to blood loss (chronic): Secondary | ICD-10-CM

## 2019-04-24 DIAGNOSIS — D509 Iron deficiency anemia, unspecified: Secondary | ICD-10-CM | POA: Diagnosis not present

## 2019-04-24 DIAGNOSIS — I2694 Multiple subsegmental pulmonary emboli without acute cor pulmonale: Secondary | ICD-10-CM

## 2019-04-24 LAB — CBC WITH DIFFERENTIAL/PLATELET
Abs Immature Granulocytes: 0.02 10*3/uL (ref 0.00–0.07)
Basophils Absolute: 0 10*3/uL (ref 0.0–0.1)
Basophils Relative: 0 %
Eosinophils Absolute: 0 10*3/uL (ref 0.0–0.5)
Eosinophils Relative: 1 %
HCT: 40 % (ref 36.0–46.0)
Hemoglobin: 12.3 g/dL (ref 12.0–15.0)
Immature Granulocytes: 0 %
Lymphocytes Relative: 47 %
Lymphs Abs: 2.3 10*3/uL (ref 0.7–4.0)
MCH: 25.2 pg — ABNORMAL LOW (ref 26.0–34.0)
MCHC: 30.8 g/dL (ref 30.0–36.0)
MCV: 81.8 fL (ref 80.0–100.0)
Monocytes Absolute: 0.4 10*3/uL (ref 0.1–1.0)
Monocytes Relative: 7 %
Neutro Abs: 2.2 10*3/uL (ref 1.7–7.7)
Neutrophils Relative %: 45 %
Platelets: 277 10*3/uL (ref 150–400)
RBC: 4.89 MIL/uL (ref 3.87–5.11)
RDW: 16.3 % — ABNORMAL HIGH (ref 11.5–15.5)
WBC: 5 10*3/uL (ref 4.0–10.5)
nRBC: 0 % (ref 0.0–0.2)

## 2019-04-24 LAB — PREGNANCY, URINE: Preg Test, Ur: NEGATIVE

## 2019-04-24 LAB — FERRITIN: Ferritin: 11 ng/mL (ref 11–307)

## 2019-04-25 ENCOUNTER — Inpatient Hospital Stay: Payer: Medicare Other | Admitting: Hematology and Oncology

## 2019-04-25 ENCOUNTER — Inpatient Hospital Stay: Payer: Medicare Other

## 2019-04-25 LAB — LUPUS ANTICOAGULANT PANEL
DRVVT: 26.3 s (ref 0.0–47.0)
PTT Lupus Anticoagulant: 34.2 s (ref 0.0–51.9)

## 2019-05-06 ENCOUNTER — Other Ambulatory Visit: Payer: Self-pay

## 2019-05-06 ENCOUNTER — Telehealth: Payer: Self-pay | Admitting: Hematology and Oncology
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
## 2019-05-06 NOTE — Telephone Encounter (Signed)
Spoke to pt and completed travel screen. Also explained about addl screening questions they will be asked, new guidelines about mask req, no visitors, and fever checks.   Pt also inquired about if she can get an EST PT 15 appt on 6/10 as well since she missed her last appt on 04/25/19 due to not being notified about it. Advised pt that I would send an InBasket message to her scheduler Dan Europe to see if this is possible which I did.

## 2019-05-07 ENCOUNTER — Inpatient Hospital Stay: Payer: 59 | Attending: Hematology and Oncology | Admitting: Hematology and Oncology

## 2019-05-07 ENCOUNTER — Inpatient Hospital Stay: Payer: 59

## 2019-05-07 ENCOUNTER — Ambulatory Visit: Payer: Self-pay

## 2019-05-07 ENCOUNTER — Encounter: Payer: Self-pay | Admitting: Hematology and Oncology

## 2019-05-07 VITALS — BP 165/90 | HR 91 | Temp 98.1°F | Resp 18

## 2019-05-07 DIAGNOSIS — N920 Excessive and frequent menstruation with regular cycle: Secondary | ICD-10-CM

## 2019-05-07 DIAGNOSIS — E538 Deficiency of other specified B group vitamins: Secondary | ICD-10-CM

## 2019-05-07 DIAGNOSIS — F319 Bipolar disorder, unspecified: Secondary | ICD-10-CM

## 2019-05-07 DIAGNOSIS — Z79899 Other long term (current) drug therapy: Secondary | ICD-10-CM

## 2019-05-07 DIAGNOSIS — Z86711 Personal history of pulmonary embolism: Secondary | ICD-10-CM

## 2019-05-07 DIAGNOSIS — Z7951 Long term (current) use of inhaled steroids: Secondary | ICD-10-CM | POA: Diagnosis not present

## 2019-05-07 DIAGNOSIS — J45909 Unspecified asthma, uncomplicated: Secondary | ICD-10-CM | POA: Diagnosis not present

## 2019-05-07 DIAGNOSIS — F419 Anxiety disorder, unspecified: Secondary | ICD-10-CM

## 2019-05-07 DIAGNOSIS — D500 Iron deficiency anemia secondary to blood loss (chronic): Secondary | ICD-10-CM | POA: Diagnosis not present

## 2019-05-07 DIAGNOSIS — I2694 Multiple subsegmental pulmonary emboli without acute cor pulmonale: Secondary | ICD-10-CM

## 2019-05-07 DIAGNOSIS — Z7901 Long term (current) use of anticoagulants: Secondary | ICD-10-CM

## 2019-05-07 DIAGNOSIS — Z3202 Encounter for pregnancy test, result negative: Secondary | ICD-10-CM | POA: Insufficient documentation

## 2019-05-07 LAB — PREGNANCY, URINE: Preg Test, Ur: NEGATIVE

## 2019-05-07 MED ORDER — IRON-VITAMIN C 65-125 MG PO TABS: 1.0000 | ORAL_TABLET | Freq: Two times a day (BID) | ORAL | 1 refills | Status: DC

## 2019-05-07 MED ORDER — CYANOCOBALAMIN 1000 MCG/ML IJ SOLN
1000.0000 ug | Freq: Once | INTRAMUSCULAR | Status: AC
  Administered 2019-05-07: 1000 ug via INTRAMUSCULAR

## 2019-05-07 MED ORDER — IRON SUCROSE 20 MG/ML IV SOLN: 200.0000 mg | Freq: Once | INTRAVENOUS | Status: DC

## 2019-05-07 NOTE — Progress Notes (Signed)
Patient c/o having chest x 1 month and she is see a cardiology. Also c/o palpation, dizziness, light headed, tired and fatigue x 1 month.

## 2019-05-19 ENCOUNTER — Telehealth: Payer: Self-pay

## 2019-05-19 ENCOUNTER — Other Ambulatory Visit: Payer: Self-pay

## 2019-05-19 DIAGNOSIS — D500 Iron deficiency anemia secondary to blood loss (chronic): Secondary | ICD-10-CM

## 2019-05-19 NOTE — Telephone Encounter (Signed)
VM left requesting callback from patient.

## 2019-05-19 NOTE — Telephone Encounter (Signed)
-----   Message from Lequita Asal, MD sent at 05/18/2019  3:12 PM EDT ----- Regarding: Did patient get her Vitron C (her pharmacy did not carry it)?

## 2019-05-20 ENCOUNTER — Telehealth: Payer: Self-pay

## 2019-05-20 DIAGNOSIS — D500 Iron deficiency anemia secondary to blood loss (chronic): Secondary | ICD-10-CM

## 2019-05-20 DIAGNOSIS — D649 Anemia, unspecified: Secondary | ICD-10-CM

## 2019-05-20 MED ORDER — IRON-VITAMIN C 65-125 MG PO TABS: 1.0000 | ORAL_TABLET | Freq: Two times a day (BID) | ORAL | 1 refills | Status: DC

## 2019-05-20 NOTE — Telephone Encounter (Signed)
Contacted patient to inform her that Dr. Mike Gip is okay with her having labs checked. Patient verbalizes understanding and denies any further questions.

## 2019-05-20 NOTE — Telephone Encounter (Signed)
-----   Message from Lequita Asal, MD sent at 05/20/2019 12:23 PM EDT -----  OK to order it.  ----- Message ----- From: Arlan Organ, RN Sent: 05/19/2019  12:12 PM EDT To: Lequita Asal, MD  Patient reports she would like to have her HGB checked d/t being on menstrual cycle for approx 1 week. Reports periods are heavy after starting blood thinner.

## 2019-05-26 ENCOUNTER — Inpatient Hospital Stay: Payer: 59

## 2019-06-04 ENCOUNTER — Inpatient Hospital Stay: Payer: 59

## 2019-06-06 ENCOUNTER — Inpatient Hospital Stay: Payer: 59

## 2019-06-06 ENCOUNTER — Inpatient Hospital Stay: Payer: 59 | Attending: Hematology and Oncology

## 2019-06-07 ENCOUNTER — Emergency Department (HOSPITAL_COMMUNITY)
Admission: EM | Admit: 2019-06-07 | Discharge: 2019-06-07 | Disposition: A | Discharge status: Home / Self Care | Payer: 59 | Attending: Emergency Medicine | Admitting: Emergency Medicine

## 2019-06-07 ENCOUNTER — Encounter (HOSPITAL_COMMUNITY): Payer: Self-pay | Admitting: Emergency Medicine

## 2019-06-07 ENCOUNTER — Other Ambulatory Visit: Payer: Self-pay

## 2019-06-07 ENCOUNTER — Emergency Department (HOSPITAL_COMMUNITY): Payer: 59

## 2019-06-07 DIAGNOSIS — J45909 Unspecified asthma, uncomplicated: Secondary | ICD-10-CM | POA: Insufficient documentation

## 2019-06-07 DIAGNOSIS — Z79899 Other long term (current) drug therapy: Secondary | ICD-10-CM | POA: Diagnosis not present

## 2019-06-07 DIAGNOSIS — R0602 Shortness of breath: Secondary | ICD-10-CM

## 2019-06-07 DIAGNOSIS — M545 Low back pain, unspecified: Secondary | ICD-10-CM

## 2019-06-07 DIAGNOSIS — E119 Type 2 diabetes mellitus without complications: Secondary | ICD-10-CM | POA: Diagnosis not present

## 2019-06-07 DIAGNOSIS — I110 Hypertensive heart disease with heart failure: Secondary | ICD-10-CM | POA: Insufficient documentation

## 2019-06-07 DIAGNOSIS — K0889 Other specified disorders of teeth and supporting structures: Secondary | ICD-10-CM | POA: Diagnosis not present

## 2019-06-07 DIAGNOSIS — I5032 Chronic diastolic (congestive) heart failure: Secondary | ICD-10-CM | POA: Diagnosis not present

## 2019-06-07 LAB — URINALYSIS, ROUTINE W REFLEX MICROSCOPIC
Bilirubin Urine: NEGATIVE
Glucose, UA: NEGATIVE mg/dL
Ketones, ur: NEGATIVE mg/dL
Leukocytes,Ua: NEGATIVE
Nitrite: NEGATIVE
Protein, ur: NEGATIVE mg/dL
Specific Gravity, Urine: 1.003 — ABNORMAL LOW (ref 1.005–1.030)
pH: 7 (ref 5.0–8.0)

## 2019-06-07 LAB — CBC WITH DIFFERENTIAL/PLATELET
Abs Immature Granulocytes: 0.01 10*3/uL (ref 0.00–0.07)
Basophils Absolute: 0 10*3/uL (ref 0.0–0.1)
Basophils Relative: 0 %
Eosinophils Absolute: 0.1 10*3/uL (ref 0.0–0.5)
Eosinophils Relative: 1 %
HCT: 34.1 % — ABNORMAL LOW (ref 36.0–46.0)
Hemoglobin: 10.3 g/dL — ABNORMAL LOW (ref 12.0–15.0)
Immature Granulocytes: 0 %
Lymphocytes Relative: 41 %
Lymphs Abs: 2.1 10*3/uL (ref 0.7–4.0)
MCH: 25.4 pg — ABNORMAL LOW (ref 26.0–34.0)
MCHC: 30.2 g/dL (ref 30.0–36.0)
MCV: 84 fL (ref 80.0–100.0)
Monocytes Absolute: 0.6 10*3/uL (ref 0.1–1.0)
Monocytes Relative: 11 %
Neutro Abs: 2.4 10*3/uL (ref 1.7–7.7)
Neutrophils Relative %: 47 %
Platelets: 260 10*3/uL (ref 150–400)
RBC: 4.06 MIL/uL (ref 3.87–5.11)
RDW: 15.1 % (ref 11.5–15.5)
WBC: 5.2 10*3/uL (ref 4.0–10.5)
nRBC: 0 % (ref 0.0–0.2)

## 2019-06-07 LAB — COMPREHENSIVE METABOLIC PANEL
ALT: 7 U/L (ref 0–44)
AST: 10 U/L — ABNORMAL LOW (ref 15–41)
Albumin: 3.8 g/dL (ref 3.5–5.0)
Alkaline Phosphatase: 66 U/L (ref 38–126)
Anion gap: 7 (ref 5–15)
BUN: 8 mg/dL (ref 6–20)
CO2: 26 mmol/L (ref 22–32)
Calcium: 8.8 mg/dL — ABNORMAL LOW (ref 8.9–10.3)
Chloride: 104 mmol/L (ref 98–111)
Creatinine, Ser: 0.73 mg/dL (ref 0.44–1.00)
GFR calc Af Amer: >60 mL/min (ref >60)
GFR calc non Af Amer: >60 mL/min (ref >60)
Glucose, Bld: 84 mg/dL (ref 70–99)
Potassium: 3.6 mmol/L (ref 3.5–5.1)
Sodium: 137 mmol/L (ref 135–145)
Total Bilirubin: 0.3 mg/dL (ref 0.3–1.2)
Total Protein: 7.2 g/dL (ref 6.5–8.1)

## 2019-06-07 LAB — I-STAT BETA HCG BLOOD, ED (MC, WL, AP ONLY): I-stat hCG, quantitative: <5 m[IU]/mL (ref <5)

## 2019-06-07 LAB — TROPONIN I (HIGH SENSITIVITY)
Troponin I (High Sensitivity): <2 ng/L (ref <18)
Troponin I (High Sensitivity): <2 ng/L (ref <18)

## 2019-06-07 MED ORDER — HYDROCODONE-ACETAMINOPHEN 5-325 MG PO TABS: 1.0000 | ORAL_TABLET | ORAL | 0 refills | Status: DC | PRN

## 2019-06-07 MED ORDER — AMOXICILLIN 500 MG PO CAPS: 500.0000 mg | ORAL_CAPSULE | Freq: Three times a day (TID) | ORAL | 0 refills | Status: DC

## 2019-06-07 MED ORDER — IOHEXOL 350 MG/ML SOLN
100.0000 mL | Freq: Once | INTRAVENOUS | Status: AC | PRN
  Administered 2019-06-07: 100 mL via INTRAVENOUS

## 2019-06-07 NOTE — ED Triage Notes (Signed)
Dizziness, toothache to top right, cp and pain to RT flank and sob since yesterday.

## 2019-06-07 NOTE — ED Provider Notes (Signed)
[facility] EMERGENCY DEPARTMENT Provider Note   CSN: [id number] Arrival date & time: 06/07/19  1242     History   Chief Complaint Chief Complaint  Patient presents with  . Dizziness    HPI Andrea Miller is a 33 y.o. female.     The history is provided by the patient. No language interpreter was used.  Shortness of Breath Severity:  Moderate Onset quality:  Gradual Timing:  Constant Progression:  Worsening Chronicity:  New Relieved by:  Nothing Worsened by:  Nothing Associated symptoms: no chest pain    Pt complains of feeling short of breath. Pt reports she has had  pe in the past. Pt reports she has a tooth bothering her and pain in her right thigh and low back.  Past Medical History:  Diagnosis Date  . Anxiety   . Asthma   . Bipolar disorder (HCC)   . Chest pain    and angina  . CHF (congestive heart failure) (HCC)   . Depression   . Fibromyalgia   . Hypertension   . 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 Obesity   . Pre-diabetes   . Pulmonary embolus (HCC)   . Seizures (HCC)    secondary to anxiety  last seizure 01/25/18  . SVT (supraventricular tachycardia) (HCC)    with hx of syncope; managed by [facility]    Patient Active Problem List   Diagnosis Date Noted  . Chronic anticoagulation 05/07/2019  . Iron deficiency anemia due to chronic blood loss 10/15/2018  . B12 deficiency 10/13/2018  . Symptomatic anemia 10/11/2018  . Multiple subsegmental pulmonary emboli without acute cor pulmonale 09/15/2018  . Morbid obesity with BMI of 50.0-59.9, adult (HCC) 09/15/2018  . Chronic diastolic heart failure (HCC) 07/01/2018  . Lymphedema 07/01/2018  . Chronic cholecystitis   . Edema 02/13/2018  . Diabetes mellitus type 2, uncomplicated (HCC) 02/13/2018  . Hypertension 11/23/2017  . Asthma exacerbation 04/14/2017  . Asthma without status asthmaticus 01/16/2017  . Bipolar affective (HCC) 01/16/2017  . Coronary artery  disease 01/16/2017  . Syncope 12/12/2016  . Fibromyalgia 04/27/2014  . OSA (obstructive sleep apnea) 04/27/2014  . Seizure-like activity (HCC) 04/27/2014  . Migraine without status migrainosus, not intractable 04/27/2014  . Obesity 04/15/2014  . Sleep disorder 04/15/2014  . Neck pain 04/15/2014  . Headache 04/15/2014  . Restless legs syndrome 04/15/2014  . Supraventricular tachycardia (HCC) 07/13/2009    Past Surgical History:  Procedure Laterality Date  . CHOLECYSTECTOMY N/A 02/22/2018   Procedure: LAPAROSCOPIC CHOLECYSTECTOMY;  Surgeon: Ancil Linsey, MD;  Location: [facility] ORS;  Service: General;  Laterality: N/A;  . TONSILLECTOMY AND ADENOIDECTOMY N/A 09/05/2016   Procedure: TONSILLECTOMY AND ADENOIDECTOMY;  Surgeon: Linus Salmons, MD;  Location: [facility] ORS;  Service: ENT;  Laterality: N/A;     OB History    Gravida  0   Para  0   Term  0   Preterm  0   AB  0   Living  0     SAB  0   TAB  0   Ectopic  0   Multiple  0   Live Births               Home Medications    Prior to Admission medications   Medication Sig Start Date End Date Taking? Authorizing Provider  albuterol (PROVENTIL) (2.5 MG/3ML) 0.083% nebulizer solution Take 2.5 mg by nebulization every 4 (four) hours as needed  for wheezing or shortness of breath.   Yes [provider]  budesonide-formoterol (SYMBICORT) 160-4.5 MCG/ACT inhaler Inhale 2 puffs into the lungs 2 (two) times daily. 10/15/18  Yes Shane Crutch, MD  Calcium-Magnesium 500-250 MG TABS Take 1 tablet by mouth 2 (two) times daily.    Yes [provider]  clonazePAM (KLONOPIN) 1 MG tablet Take 1 tablet by mouth 2 (two) times a day. 04/02/19  Yes [provider]  cyanocobalamin (,VITAMIN B-12,) 1000 MCG/ML injection Inject 1,000 mcg into the muscle every 30 (thirty) days.   Yes [provider]  Dexlansoprazole (DEXILANT) 30 MG capsule Take 30 mg by mouth daily.   Yes [provider]   diclofenac sodium (VOLTAREN) 1 % GEL Apply 4 g topically daily as needed (for pain).   Yes [provider]  diltiazem (CARDIZEM CD) 360 MG 24 hr capsule Take 360 mg by mouth every morning. 12/18/18  Yes [provider]  escitalopram (LEXAPRO) 20 MG tablet Take 20 mg by mouth daily.   Yes [provider]  fluticasone (FLONASE) 50 MCG/ACT nasal spray Place 2 sprays into both nostrils daily as needed for allergies.    Yes [provider]  folic acid (FOLVITE) 1 MG tablet Take 1 mg by mouth daily.  12/15/16  Yes [provider]  furosemide (LASIX) 20 MG tablet Take 40 mg by mouth 2 (two) times daily.    Yes [provider]  gabapentin (NEURONTIN) 400 MG capsule Take 400 mg by mouth 3 (three) times daily.   Yes [provider]  hydroxypropyl methylcellulose / hypromellose (ISOPTO TEARS / GONIOVISC) 2.5 % ophthalmic solution Place 1 drop into both eyes 3 (three) times daily as needed for dry eyes.   Yes [provider]  ibuprofen (ADVIL,MOTRIN) 600 MG tablet Take 600 mg by mouth every 8 (eight) hours as needed for mild pain or moderate pain.  12/25/18  Yes [provider]  Iron-Vitamin C 65-125 MG TABS Take 1 tablet by mouth 2 (two) times daily. 05/20/19  Yes Corcoran, Ferdie Ping, MD  ketoconazole (NIZORAL) 2 % shampoo Apply 1 application topically every 30 (thirty) days.  10/01/18  Yes [provider]  lamoTRIgine (LAMICTAL) 200 MG tablet Take 1 tablet by mouth 2 (two) times a day. 05/20/19  Yes [provider]  linaclotide (LINZESS) 290 MCG CAPS capsule Take 290 mcg by mouth daily before breakfast.   Yes [provider]  montelukast (SINGULAIR) 10 MG tablet Take 1 tablet (10 mg total) by mouth at bedtime. 10/15/18  Yes Shane Crutch, MD  Potassium Chloride ER 20 MEQ TBCR Take 20 mEq by mouth 2 (two) times daily.    Yes [provider]  Prenatal Vit-Fe Fumarate-FA (MULTIVITAMIN-PRENATAL)  27-0.8 MG TABS tablet Take 1 tablet by mouth daily at 12 noon.   Yes [provider]  propranolol (INDERAL) 40 MG tablet Take 2 tablets (80 mg total) by mouth 3 (three) times daily. 10/13/18  Yes Salary, Montell D, MD  risperiDONE (RISPERDAL) 3 MG tablet Take 3 mg by mouth at bedtime.    Yes [provider]  rivaroxaban (XARELTO) 20 MG TABS tablet Take 20 mg by mouth every morning.   Yes [provider]  rosuvastatin (CRESTOR) 20 MG tablet Take 20 mg by mouth daily.   Yes [provider]  tiZANidine (ZANAFLEX) 4 MG tablet Take 4 mg by mouth 3 (three) times daily.  03/26/17  Yes [provider]  Topiramate ER (TROKENDI XR) 200  MG CP24 Take 1 capsule by mouth daily.    Yes [provider]  vitamin C (VITAMIN C) 250 MG tablet Take 1 tablet (250 mg total) by mouth 2 (two) times daily. 10/13/18  Yes Salary, Evelena AsaMontell D, MD  amoxicillin (AMOXIL) 500 MG capsule Take 1 capsule (500 mg total) by mouth 3 (three) times daily. 06/07/19   Elson AreasSofia, Tavio Biegel K, PA-C  clonazePAM (KLONOPIN) 2 MG tablet Take 2 mg by mouth 3 (three) times daily.     [provider]  HYDROcodone-acetaminophen (NORCO/VICODIN) 5-325 MG tablet Take 1 tablet by mouth every 4 (four) hours as needed. 06/07/19   Elson AreasSofia, Swetha Rayle K, PA-C  HYDROcodone-acetaminophen (NORCO/VICODIN) 5-325 MG tablet Take 1 tablet by mouth every 4 (four) hours as needed. 06/07/19   Elson AreasSofia, Kirk Sampley K, PA-C  lactulose (CHRONULAC) 10 GM/15ML solution Take 30 g by mouth daily as needed for mild constipation.  08/28/18   [provider]  lamoTRIgine (LAMICTAL) 100 MG tablet Take 200 mg by mouth 2 (two) times daily.     [provider]    Family History Family History  Problem Relation Age of Onset  . Diabetes Mother   . Hypertension Mother   . Asthma Mother   . Gallstones Mother   . Diabetes Father   . Hypertension Father   . Gallstones Father   . Bipolar disorder Sister   . COPD Brother   .  Schizophrenia Brother   . COPD Brother   . Hypertension Brother     Social History Social History   Tobacco Use  . Smoking status: Never Smoker  . Smokeless tobacco: Never Used  Substance Use Topics  . Alcohol use: No  . Drug use: No     Allergies   Cortizone-10 [hydrocortisone], Garlic, Prednisone, and Corticosteroids   Review of Systems Review of Systems  Respiratory: Positive for shortness of breath.   Cardiovascular: Negative for chest pain.  All other systems reviewed and are negative.    Physical Exam Updated Vital Signs BP 136/87   Pulse (!) 103   Temp 98.7 F (37.1 C) (Oral)   Resp (!) 28   Ht 5\' 10"  (1.778 m)   Wt (!) 158.8 kg   LMP 05/23/2019 (Approximate)   SpO2 100%   BMI 50.22 kg/m   Physical Exam Vitals signs and nursing note reviewed.  Constitutional:      Appearance: She is well-developed.  HENT:     Head: Normocephalic.     Right Ear: Tympanic membrane normal.     Left Ear: Tympanic membrane normal.     Nose: Nose normal.     Mouth/Throat:     Mouth: Mucous membranes are moist.  Eyes:     Pupils: Pupils are equal, round, and reactive to light.  Neck:     Musculoskeletal: Normal range of motion.  Cardiovascular:     Rate and Rhythm: Normal rate.  Pulmonary:     Effort: Pulmonary effort is normal.  Abdominal:     General: Abdomen is flat. There is no distension.  Musculoskeletal: Normal range of motion.        General: Tenderness present.  Skin:    General: Skin is warm.  Neurological:     Mental Status: She is alert and oriented to person, place, and time.  Psychiatric:        Mood and Affect: Mood normal.      ED Treatments / Results  Labs (all labs ordered are listed, but only abnormal results  are displayed) Labs Reviewed  CBC WITH DIFFERENTIAL/PLATELET - Abnormal; Notable for the following components:      Result Value   Hemoglobin 10.3 (*)    HCT 34.1 (*)    MCH 25.4 (*)    All other components within normal  limits  COMPREHENSIVE METABOLIC PANEL - Abnormal; Notable for the following components:   Calcium 8.8 (*)    AST 10 (*)    All other components within normal limits  URINALYSIS, ROUTINE W REFLEX MICROSCOPIC - Abnormal; Notable for the following components:   Color, Urine STRAW (*)    Specific Gravity, Urine 1.003 (*)    Hgb urine dipstick SMALL (*)    Bacteria, UA RARE (*)    All other components within normal limits  I-STAT BETA HCG BLOOD, ED ([facility], [facility], [facility] ONLY)  TROPONIN I (HIGH SENSITIVITY)  TROPONIN I (HIGH SENSITIVITY)    EKG EKG Interpretation  Date/Time:  Saturday June 07 2019 13:20:29 EDT Ventricular Rate:  104 PR Interval:    QRS Duration: 105 QT Interval:  335 QTC Calculation: 441 R Axis:   21 Text Interpretation:  Sinus tachycardia Inferior infarct, old No significant change since last tracing Confirmed by Fredia Sorrow [phone]) on 06/07/2019 1:56:36 PM   Radiology Ct Angio Chest Pe W And/or Wo Contrast  Result Date: 06/07/2019 CLINICAL DATA:  Shortness of breath and dizziness EXAM: CT ANGIOGRAPHY CHEST WITH CONTRAST TECHNIQUE: Multidetector CT imaging of the chest was performed using the standard protocol during bolus administration of intravenous contrast. Multiplanar CT image reconstructions and MIPs were obtained to evaluate the vascular anatomy. CONTRAST:  177mL OMNIPAQUE IOHEXOL 350 MG/ML SOLN COMPARISON:  January 13, 2019 and June 22, 2018 FINDINGS: Cardiovascular: There is no demonstrable pulmonary embolus. There is no thoracic aortic aneurysm or dissection. The visualized great vessels appear unremarkable. There is no pericardial effusion or pericardial thickening. Mediastinum/Nodes: Thyroid appears inhomogeneous. There is a mass arising in the left lobe of the thyroid measuring 1.6 x 1.4 cm. There Is no appreciable thoracic adenopathy. No esophageal lesions are evident. Lungs/Pleura: There is scarring in the anterior segment of the right upper lobe. There is no  edema or consolidation. On axial slice 38 series 6, there is a 2 mm nodular opacity in the anterior segment right upper lobe, stable. There is no evident pleural effusion. Upper Abdomen: Gallbladder absent. Visualized upper abdominal structures otherwise appear unremarkable. Musculoskeletal: No blastic or lytic bone lesions. No evident chest wall lesions. Review of the MIP images confirms the above findings. IMPRESSION: 1. No demonstrable pulmonary embolus. No thoracic aortic aneurysm or dissection. 2. **An incidental finding of potential clinical significance has been found. There is a dominant nodular lesion in the left lobe of the thyroid measuring 1.6 x 1.4 cm. Consider further evaluation with nonemergent thyroid ultrasound. If patient is clinically hyperthyroid, consider nuclear medicine thyroid uptake and scan.** 3.  No adenopathy evident. 4. No lung edema or consolidation. Mild scarring anterior segment right upper lobe. Stable 2 mm nodular opacity in the anterior segment right upper lobe. Stability over time is indicative of benign etiology. 5.  Gallbladder absent. Electronically Signed   By: Lowella Grip III M.D.   On: 06/07/2019 15:50    Procedures Procedures (including critical care time)  Medications Ordered in ED Medications  iohexol (OMNIPAQUE) 350 MG/ML injection 100 mL (100 mLs Intravenous Contrast Given 06/07/19 1522)     Initial Impression / Assessment and Plan / ED Course  I have reviewed the triage vital signs  and the nursing notes.  Pertinent labs & imaging results that were available during my care of the patient were reviewed by me and considered in my medical decision making (see chart for details).        MDM  Pt counseled on results and follow up.  Pt given rx for hydrocodone and amoxicillian for dental pain.  Pt advised to continue current medications.  See primary for evaluation of thyroid.   Final Clinical Impressions(s) / ED Diagnoses   Final diagnoses:   Toothache  Right low back pain, unspecified chronicity, unspecified whether sciatica present  Shortness of breath    ED Discharge Orders         Ordered    HYDROcodone-acetaminophen (NORCO/VICODIN) 5-325 MG tablet  Every 4 hours PRN     06/07/19 1758    amoxicillin (AMOXIL) 500 MG capsule  3 times daily,   Status:  Discontinued     06/07/19 1758    amoxicillin (AMOXIL) 500 MG capsule  3 times daily     06/07/19 1808    HYDROcodone-acetaminophen (NORCO/VICODIN) 5-325 MG tablet  Every 4 hours PRN     06/07/19 1808        An After Visit Summary was printed and given to the patient.    Osie CheeksSofia, Deontrey Massi K, PA-C 06/07/19 2028    Pricilla LovelessGoldston, Scott, MD 06/08/19 [phone]

## 2019-06-07 NOTE — Discharge Instructions (Signed)
Schedule to see a dentist.  See your Physician for evaluation

## 2019-06-09 ENCOUNTER — Encounter: Payer: Self-pay | Admitting: Emergency Medicine

## 2019-06-09 ENCOUNTER — Emergency Department: Payer: 59

## 2019-06-09 ENCOUNTER — Emergency Department
Admission: EM | Admit: 2019-06-09 | Discharge: 2019-06-09 | Disposition: A | Discharge status: Home / Self Care | Payer: 59 | Attending: Emergency Medicine | Admitting: Emergency Medicine

## 2019-06-09 ENCOUNTER — Other Ambulatory Visit: Payer: Self-pay

## 2019-06-09 DIAGNOSIS — I5032 Chronic diastolic (congestive) heart failure: Secondary | ICD-10-CM | POA: Insufficient documentation

## 2019-06-09 DIAGNOSIS — I2510 Atherosclerotic heart disease of native coronary artery without angina pectoris: Secondary | ICD-10-CM | POA: Diagnosis not present

## 2019-06-09 DIAGNOSIS — Z7901 Long term (current) use of anticoagulants: Secondary | ICD-10-CM | POA: Insufficient documentation

## 2019-06-09 DIAGNOSIS — J45909 Unspecified asthma, uncomplicated: Secondary | ICD-10-CM | POA: Insufficient documentation

## 2019-06-09 DIAGNOSIS — E119 Type 2 diabetes mellitus without complications: Secondary | ICD-10-CM | POA: Insufficient documentation

## 2019-06-09 DIAGNOSIS — K0889 Other specified disorders of teeth and supporting structures: Secondary | ICD-10-CM | POA: Diagnosis present

## 2019-06-09 DIAGNOSIS — Z79899 Other long term (current) drug therapy: Secondary | ICD-10-CM | POA: Diagnosis not present

## 2019-06-09 DIAGNOSIS — I110 Hypertensive heart disease with heart failure: Secondary | ICD-10-CM | POA: Diagnosis not present

## 2019-06-09 DIAGNOSIS — K047 Periapical abscess without sinus: Secondary | ICD-10-CM | POA: Diagnosis not present

## 2019-06-09 MED ORDER — MAGIC MOUTHWASH W/LIDOCAINE: 5.0000 mL | Freq: Four times a day (QID) | ORAL | 0 refills | Status: DC

## 2019-06-09 MED ORDER — CLINDAMYCIN HCL 300 MG PO CAPS: 300.0000 mg | ORAL_CAPSULE | Freq: Four times a day (QID) | ORAL | 0 refills | Status: DC

## 2019-06-09 MED ORDER — SODIUM CHLORIDE 0.9 % IV BOLUS: 1000.0000 mL | Freq: Once | INTRAVENOUS | Status: DC

## 2019-06-09 MED ORDER — CLINDAMYCIN PHOSPHATE 600 MG/50ML IV SOLN: 600.0000 mg | Freq: Once | INTRAVENOUS | Status: DC

## 2019-06-09 MED ORDER — CLINDAMYCIN PHOSPHATE 600 MG/4ML IJ SOLN
600.0000 mg | Freq: Once | INTRAMUSCULAR | Status: AC
  Administered 2019-06-09: 600 mg via INTRAMUSCULAR
  Filled 2019-06-09: qty 4

## 2019-06-09 MED ORDER — OXYCODONE-ACETAMINOPHEN 5-325 MG PO TABS
1.0000 | ORAL_TABLET | Freq: Once | ORAL | Status: AC
  Administered 2019-06-09: 1 via ORAL
  Filled 2019-06-09: qty 1

## 2019-06-09 MED ORDER — ONDANSETRON HCL 4 MG/2ML IJ SOLN: 4.0000 mg | Freq: Once | INTRAMUSCULAR | Status: DC

## 2019-06-09 MED ORDER — MORPHINE SULFATE (PF) 4 MG/ML IV SOLN: 4.0000 mg | Freq: Once | INTRAVENOUS | Status: DC

## 2019-06-09 MED ORDER — HYDROCODONE-ACETAMINOPHEN 5-325 MG PO TABS: 1.0000 | ORAL_TABLET | ORAL | 0 refills | Status: DC | PRN

## 2019-06-09 NOTE — ED Notes (Signed)
Patient transported to CT 

## 2019-06-09 NOTE — ED Triage Notes (Signed)
C/O right upper jaw pain and swelling x 4-5 days.

## 2019-06-09 NOTE — ED Provider Notes (Signed)
[facility] Emergency Department Provider Note  ____________________________________________  Time seen: Approximately 7:15 PM  I have reviewed the triage vital signs and the nursing notes.   HISTORY  Chief Complaint Dental Pain    HPI Andrea Miller is a 33 y.o. female who presents the emergency department complaining of worsening right upper dental infection.  Patient was seen in another emergency department 3 days ago, diagnosed with a dental infection, placed on amoxicillin and pain medication.  Patient reports that she has taken 3 days worth of antibiotics and the area is worsening in regards to both edema as well as pain.  Edema has spread to the cheek, and is now encompassing the inferior orbital region.  No vision changes.  No fevers or chills.  No difficulty breathing or swallowing.  Patient does have a history of anxiety, asthma, bipolar disorder, CHF, fibromyalgia, hypertension, pulmonary embolus, seizures, SVT.  No complaints of chronic medical problems.         Past Medical History:  Diagnosis Date  . Anxiety   . Asthma   . Bipolar disorder (HCC)   . Chest pain    and angina  . CHF (congestive heart failure) (HCC)   . Depression   . Fibromyalgia   . Hypertension   . 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 Obesity   . Pre-diabetes   . Pulmonary embolus (HCC)   . Seizures (HCC)    secondary to anxiety  last seizure 01/25/18  . SVT (supraventricular tachycardia) (HCC)    with hx of syncope; [facility]    Patient Active Problem List   Diagnosis Date Noted  . Chronic anticoagulation 05/07/2019  . Iron deficiency anemia due to chronic blood loss 10/15/2018  . B12 deficiency 10/13/2018  . Symptomatic anemia 10/11/2018  . Multiple subsegmental pulmonary emboli without acute cor pulmonale 09/15/2018  . Morbid obesity with BMI of 50.0-59.9, adult (HCC) 09/15/2018  . Chronic diastolic heart  failure (HCC) 40/98/119108/03/2018  . Lymphedema 07/01/2018  . Chronic cholecystitis   . Edema 02/13/2018  . Diabetes mellitus type 2, uncomplicated (HCC) 02/13/2018  . Hypertension 11/23/2017  . Asthma exacerbation 04/14/2017  . Asthma without status asthmaticus 01/16/2017  . Bipolar affective (HCC) 01/16/2017  . Coronary artery disease 01/16/2017  . Syncope 12/12/2016  . Fibromyalgia 04/27/2014  . OSA (obstructive sleep apnea) 04/27/2014  . Seizure-like activity (HCC) 04/27/2014  . Migraine without status migrainosus, not intractable 04/27/2014  . Obesity 04/15/2014  . Sleep disorder 04/15/2014  . Neck pain 04/15/2014  . Headache 04/15/2014  . Restless legs syndrome 04/15/2014  . Supraventricular tachycardia (HCC) 07/13/2009    Past Surgical History:  Procedure Laterality Date  . CHOLECYSTECTOMY N/A 02/22/2018   Procedure: LAPAROSCOPIC CHOLECYSTECTOMY;  Surgeon: Ancil Linseyavis, Jason Evan, MD;  Location: [facility] ORS;  Service: General;  Laterality: N/A;  . TONSILLECTOMY AND ADENOIDECTOMY N/A 09/05/2016   Procedure: TONSILLECTOMY AND ADENOIDECTOMY;  Surgeon: Linus Salmonshapman McQueen, MD;  Location: [facility] ORS;  Service: ENT;  Laterality: N/A;    Prior to Admission medications   Medication Sig Start Date End Date Taking? Authorizing Provider  albuterol (PROVENTIL) (2.5 MG/3ML) 0.083% nebulizer solution Take 2.5 mg by nebulization every 4 (four) hours as needed for wheezing or shortness of breath.    [provider]  amoxicillin (AMOXIL) 500 MG capsule Take 1 capsule (500 mg total) by mouth 3 (three) times daily. 06/07/19   Elson AreasSofia, Leslie K, PA-C  budesonide-formoterol (SYMBICORT) 160-4.5 MCG/ACT  inhaler Inhale 2 puffs into the lungs 2 (two) times daily. 10/15/18   Shane Crutch, MD  Calcium-Magnesium 500-250 MG TABS Take 1 tablet by mouth 2 (two) times daily.     [provider]  clindamycin (CLEOCIN) 300 MG capsule Take 1 capsule (300 mg total) by mouth 4 (four) times daily. 06/09/19    Joniah Bednarski, Delorise Royals, PA-C  clonazePAM (KLONOPIN) 1 MG tablet Take 1 tablet by mouth 2 (two) times a day. 04/02/19   [provider]  clonazePAM (KLONOPIN) 2 MG tablet Take 2 mg by mouth 3 (three) times daily.     [provider]  cyanocobalamin (,VITAMIN B-12,) 1000 MCG/ML injection Inject 1,000 mcg into the muscle every 30 (thirty) days.    [provider]  Dexlansoprazole (DEXILANT) 30 MG capsule Take 30 mg by mouth daily.    [provider]  diclofenac sodium (VOLTAREN) 1 % GEL Apply 4 g topically daily as needed (for pain).    [provider]  diltiazem (CARDIZEM CD) 360 MG 24 hr capsule Take 360 mg by mouth every morning. 12/18/18   [provider]  escitalopram (LEXAPRO) 20 MG tablet Take 20 mg by mouth daily.    [provider]  fluticasone (FLONASE) 50 MCG/ACT nasal spray Place 2 sprays into both nostrils daily as needed for allergies.     [provider]  folic acid (FOLVITE) 1 MG tablet Take 1 mg by mouth daily.  12/15/16   [provider]  furosemide (LASIX) 20 MG tablet Take 40 mg by mouth 2 (two) times daily.     [provider]  gabapentin (NEURONTIN) 400 MG capsule Take 400 mg by mouth 3 (three) times daily.    [provider]  HYDROcodone-acetaminophen (NORCO/VICODIN) 5-325 MG tablet Take 1 tablet by mouth every 4 (four) hours as needed. 06/07/19   Elson Areas, PA-C  HYDROcodone-acetaminophen (NORCO/VICODIN) 5-325 MG tablet Take 1 tablet by mouth every 4 (four) hours as needed. 06/07/19   Elson Areas, PA-C  HYDROcodone-acetaminophen (NORCO/VICODIN) 5-325 MG tablet Take 1 tablet by mouth every 4 (four) hours as needed for moderate pain. 06/09/19   Keneth Borg, Delorise Royals, PA-C  hydroxypropyl methylcellulose / hypromellose (ISOPTO TEARS / GONIOVISC) 2.5 % ophthalmic solution Place 1 drop into both eyes 3 (three) times daily as needed for dry eyes.    [provider]  ibuprofen  (ADVIL,MOTRIN) 600 MG tablet Take 600 mg by mouth every 8 (eight) hours as needed for mild pain or moderate pain.  12/25/18   [provider]  Iron-Vitamin C 65-125 MG TABS Take 1 tablet by mouth 2 (two) times daily. 05/20/19   Rosey Bath, MD  ketoconazole (NIZORAL) 2 % shampoo Apply 1 application topically every 30 (thirty) days.  10/01/18   [provider]  lactulose (CHRONULAC) 10 GM/15ML solution Take 30 g by mouth daily as needed for mild constipation.  08/28/18   [provider]  lamoTRIgine (LAMICTAL) 100 MG tablet Take 200 mg by mouth 2 (two) times daily.     [provider]  lamoTRIgine (LAMICTAL) 200 MG tablet Take 1 tablet by mouth 2 (two) times a day. 05/20/19   [provider]  linaclotide (LINZESS) 290 MCG CAPS capsule Take 290 mcg by mouth daily before breakfast.    [provider]  magic mouthwash w/lidocaine SOLN Take 5 mLs by mouth 4 (four) times daily. 06/09/19   Chyenne Sobczak, Delorise Royals, PA-C  montelukast (SINGULAIR) 10 MG tablet Take 1  tablet (10 mg total) by mouth at bedtime. 10/15/18   Shane Crutchamachandran, Pradeep, MD  Potassium Chloride ER 20 MEQ TBCR Take 20 mEq by mouth 2 (two) times daily.     [provider]  Prenatal Vit-Fe Fumarate-FA (MULTIVITAMIN-PRENATAL) 27-0.8 MG TABS tablet Take 1 tablet by mouth daily at 12 noon.    [provider]  propranolol (INDERAL) 40 MG tablet Take 2 tablets (80 mg total) by mouth 3 (three) times daily. 10/13/18   Salary, Evelena AsaMontell D, MD  risperiDONE (RISPERDAL) 3 MG tablet Take 3 mg by mouth at bedtime.     [provider]  rivaroxaban (XARELTO) 20 MG TABS tablet Take 20 mg by mouth every morning.    [provider]  rosuvastatin (CRESTOR) 20 MG tablet Take 20 mg by mouth daily.    [provider]  tiZANidine (ZANAFLEX) 4 MG tablet Take 4 mg by mouth 3 (three) times daily.  03/26/17   [provider]  Topiramate ER (TROKENDI XR) 200 MG CP24  Take 1 capsule by mouth daily.     [provider]  vitamin C (VITAMIN C) 250 MG tablet Take 1 tablet (250 mg total) by mouth 2 (two) times daily. 10/13/18   Salary, Evelena AsaMontell D, MD    Allergies Cortizone-10 [hydrocortisone], Garlic, Prednisone, and Corticosteroids  Family History  Problem Relation Age of Onset  . Diabetes Mother   . Hypertension Mother   . Asthma Mother   . Gallstones Mother   . Diabetes Father   . Hypertension Father   . Gallstones Father   . Bipolar disorder Sister   . COPD Brother   . Schizophrenia Brother   . COPD Brother   . Hypertension Brother     Social History Social History   Tobacco Use  . Smoking status: Never Smoker  . Smokeless tobacco: Never Used  Substance Use Topics  . Alcohol use: No  . Drug use: No     Review of Systems  Constitutional: No fever/chills Eyes: No visual changes. No discharge ENT: Worsening right upper dental infection, already on antibiotics Cardiovascular: no chest pain. Respiratory: no cough. No SOB. Gastrointestinal: No abdominal pain.  No nausea, no vomiting.  No diarrhea.  No constipation. Musculoskeletal: Negative for musculoskeletal pain. Skin: Negative for rash, abrasions, lacerations, ecchymosis. Neurological: Negative for headaches, focal weakness or numbness. 10-point ROS otherwise negative.  ____________________________________________   PHYSICAL EXAM:  VITAL SIGNS: ED Triage Vitals  Enc Vitals Group     BP 06/09/19 1734 (!) 158/94     Pulse Rate 06/09/19 1734 (!) 106     Resp 06/09/19 1734 18     Temp 06/09/19 1734 98.9 F (37.2 C)     Temp Source 06/09/19 1734 Oral     SpO2 06/09/19 1734 100 %     Weight 06/09/19 1731 (!) 350 lb 1.5 oz (158.8 kg)     Height --      Head Circumference --      Peak Flow --      Pain Score 06/09/19 1730 9     Pain Loc --      Pain Edu? --      Excl. in GC? --      Constitutional: Alert and oriented. Well appearing and in no acute  distress. Eyes: Conjunctivae are normal. PERRL. EOMI. Head: Atraumatic. ENT:      Ears:       Nose: No congestion/rhinnorhea.      Mouth/Throat: Mucous membranes are moist.  Visualization of dentition reveals obvious signs of dental infection to the second molar right upper dentition.  No purulent drainage.  Extensive edema noted to the right cheek extending into the inferior orbital region.  No other intraoral signs of infection.  Uvula is midline.  Sub-lingular space is soft with no edema..  Neck: No stridor.  Neck is supple full range of motion Hematological/Lymphatic/Immunilogical: No cervical lymphadenopathy. Cardiovascular: Normal rate, regular rhythm. Normal S1 and S2.  Good peripheral circulation. Respiratory: Normal respiratory effort without tachypnea or retractions. Lungs CTAB. Good air entry to the bases with no decreased or absent breath sounds. Musculoskeletal: Full range of motion to all extremities. No gross deformities appreciated. Neurologic:  Normal speech and language. No gross focal neurologic deficits are appreciated.  Skin:  Skin is warm, dry and intact. No rash noted. Psychiatric: Mood and affect are normal. Speech and behavior are normal. Patient exhibits appropriate insight and judgement.   ____________________________________________   LABS (all labs ordered are listed, but only abnormal results are displayed)  Labs Reviewed - No data to display ____________________________________________  EKG   ____________________________________________  RADIOLOGY I personally viewed and evaluated these images as part of my medical decision making, as well as reviewing the written report by the radiologist.  Ct Maxillofacial Wo Contrast  Result Date: 06/09/2019 CLINICAL DATA:  33 y/o F; right upper jaw pain and swelling for 4-5 days. EXAM: CT MAXILLOFACIAL WITHOUT CONTRAST TECHNIQUE: Multidetector CT imaging of the maxillofacial structures was performed. Multiplanar  CT image reconstructions were also generated. COMPARISON:  08/09/2018 CT head. FINDINGS: Osseous: Extensive dental disease with multiple dental caries, absent crowns, periapical cysts. Orbits: Negative. No traumatic or inflammatory finding. Sinuses: Right frontal sinus opacification. Moderate right maxillary sinus mucosal thickening. Normal aeration of the mastoid air cells. Soft tissues: Edema within the superficial soft tissues of the right face as well as mild edema within the right masticator compartment and anterior parotid space. No discrete abscess identified. Limited intracranial: No significant or unexpected finding. IMPRESSION: 1. Extensive dental disease with multiple dental caries, absent crowns, periapical cysts. 2. Inflammation within superficial soft tissues of the right face as well as mild inflammation in the right masticator compartment and anterior parotid space. No discrete abscess identified. Findings are probably related to an odontogenic infection. 3. Right frontal and right maxillary sinus disease. Electronically Signed   By: Kristine Garbe M.D.   On: 06/09/2019 21:37    ____________________________________________    PROCEDURES  Procedure(s) performed:    Procedures    Medications  oxyCODONE-acetaminophen (PERCOCET/ROXICET) 5-325 MG per tablet 1 tablet (1 tablet Oral Given 06/09/19 2129)  clindamycin (CLEOCIN) injection 600 mg (600 mg Intramuscular Given 06/09/19 2129)     ____________________________________________   INITIAL IMPRESSION / ASSESSMENT AND PLAN / ED COURSE  Pertinent labs & imaging results that were available during my care of the patient were reviewed by me and considered in my medical decision making (see chart for details).  Review of the [place] CSRS was performed in accordance of the [place] prior to dispensing any controlled drugs.           Patient's diagnosis is consistent with dental abscess.  Patient presents emergency department  with worsening edema, pain to the right upper mentation.  Patient has been started on amoxicillin for dental infection.  Patient reports that this is worsening instead of improving.  Initially, labs, imaging was ordered.  Patient did not have good access, and after multiple attempts no IV access was obtained.  At this time, patient did have a CT scan without contrast which reveals no significant deep underlying abscess.  As such, patient is given clindamycin IM.  Labs will be foregone at this time.  Patient will be transitioned to clindamycin, limited pain medication prescribed.  Follow-up with primary care and dentist..  Patient is given ED precautions to return to the ED for any worsening or new symptoms.     ____________________________________________  FINAL CLINICAL IMPRESSION(S) / ED DIAGNOSES  Final diagnoses:  Dental abscess      NEW MEDICATIONS STARTED DURING THIS VISIT:  ED Discharge Orders         Ordered    HYDROcodone-acetaminophen (NORCO/VICODIN) 5-325 MG tablet  Every 4 hours PRN     06/09/19 2156    clindamycin (CLEOCIN) 300 MG capsule  4 times daily     06/09/19 2156    magic mouthwash w/lidocaine SOLN  4 times daily    Note to Pharmacy: Dispense in a 1/1/1 ratio. Use lidocaine, diphenhydramine, prednisolone   06/09/19 2156              This chart was dictated using voice recognition software/Dragon. Despite best efforts to proofread, errors can occur which can change the meaning. Any change was purely unintentional.    Racheal PatchesCuthriell, Hendrixx Severin D, PA-C 06/09/19 2158    Phineas SemenGoodman, Gray[place], MD 06/09/19 2317

## 2019-06-09 NOTE — ED Notes (Signed)
See triage note  States she was seen 2 -3 days ago for dental abscess   States she was given pain meds and antibiotics    Now right side of face is swollen  States she made an appt to see dentist next week

## 2019-06-09 NOTE — ED Notes (Signed)
Pt gives verbal understanding of dc information. Computer not working

## 2019-06-16 ENCOUNTER — Other Ambulatory Visit: Payer: Self-pay | Admitting: Family Medicine

## 2019-06-16 DIAGNOSIS — E041 Nontoxic single thyroid nodule: Secondary | ICD-10-CM

## 2019-06-24 ENCOUNTER — Ambulatory Visit: Payer: 59 | Attending: Family Medicine

## 2019-07-02 ENCOUNTER — Inpatient Hospital Stay: Payer: 59

## 2019-07-07 ENCOUNTER — Other Ambulatory Visit: Payer: 59

## 2019-07-07 ENCOUNTER — Encounter: Payer: Self-pay | Admitting: Hematology and Oncology

## 2019-07-07 ENCOUNTER — Other Ambulatory Visit: Payer: Self-pay | Admitting: Hematology and Oncology

## 2019-07-07 ENCOUNTER — Ambulatory Visit: Payer: 59

## 2019-07-07 ENCOUNTER — Ambulatory Visit: Payer: 59 | Admitting: Hematology and Oncology

## 2019-07-07 DIAGNOSIS — D500 Iron deficiency anemia secondary to blood loss (chronic): Secondary | ICD-10-CM

## 2019-07-17 ENCOUNTER — Inpatient Hospital Stay: Payer: 59 | Attending: Hematology and Oncology

## 2019-07-22 ENCOUNTER — Other Ambulatory Visit: Payer: Self-pay

## 2019-07-22 ENCOUNTER — Encounter: Payer: Self-pay | Admitting: Hematology and Oncology

## 2019-07-22 ENCOUNTER — Emergency Department
Admission: EM | Admit: 2019-07-22 | Discharge: 2019-07-22 | Disposition: A | Discharge status: Home / Self Care | Payer: 59 | Attending: Emergency Medicine | Admitting: Emergency Medicine

## 2019-07-22 ENCOUNTER — Emergency Department: Payer: 59

## 2019-07-22 DIAGNOSIS — Y93E1 Activity, personal bathing and showering: Secondary | ICD-10-CM | POA: Insufficient documentation

## 2019-07-22 DIAGNOSIS — I5032 Chronic diastolic (congestive) heart failure: Secondary | ICD-10-CM | POA: Diagnosis not present

## 2019-07-22 DIAGNOSIS — E119 Type 2 diabetes mellitus without complications: Secondary | ICD-10-CM | POA: Diagnosis present

## 2019-07-22 DIAGNOSIS — I110 Hypertensive heart disease with heart failure: Secondary | ICD-10-CM | POA: Insufficient documentation

## 2019-07-22 DIAGNOSIS — Z79899 Other long term (current) drug therapy: Secondary | ICD-10-CM | POA: Insufficient documentation

## 2019-07-22 DIAGNOSIS — S8010XA Contusion of unspecified lower leg, initial encounter: Secondary | ICD-10-CM

## 2019-07-22 DIAGNOSIS — W182XXA Fall in (into) shower or empty bathtub, initial encounter: Secondary | ICD-10-CM | POA: Insufficient documentation

## 2019-07-22 DIAGNOSIS — S0083XA Contusion of other part of head, initial encounter: Secondary | ICD-10-CM | POA: Diagnosis not present

## 2019-07-22 DIAGNOSIS — S8012XA Contusion of left lower leg, initial encounter: Secondary | ICD-10-CM | POA: Insufficient documentation

## 2019-07-22 DIAGNOSIS — Y998 Other external cause status: Secondary | ICD-10-CM | POA: Diagnosis not present

## 2019-07-22 DIAGNOSIS — Y92009 Unspecified place in unspecified non-institutional (private) residence as the place of occurrence of the external cause: Secondary | ICD-10-CM

## 2019-07-22 DIAGNOSIS — Y92012 Bathroom of single-family (private) house as the place of occurrence of the external cause: Secondary | ICD-10-CM | POA: Insufficient documentation

## 2019-07-22 DIAGNOSIS — W19XXXA Unspecified fall, initial encounter: Secondary | ICD-10-CM

## 2019-07-22 LAB — POCT PREGNANCY, URINE: Preg Test, Ur: NEGATIVE

## 2019-07-22 MED ORDER — HYDROCODONE-ACETAMINOPHEN 5-325 MG PO TABS
1.0000 | ORAL_TABLET | Freq: Once | ORAL | Status: AC
  Administered 2019-07-22: 1 via ORAL
  Filled 2019-07-22: qty 1

## 2019-07-22 MED ORDER — HYDROCODONE-ACETAMINOPHEN 5-325 MG PO TABS: 1.0000 | ORAL_TABLET | Freq: Four times a day (QID) | ORAL | 0 refills | Status: DC | PRN

## 2019-07-22 MED ORDER — HYDROCODONE-ACETAMINOPHEN 7.5-325 MG PO TABS
1.0000 | ORAL_TABLET | Freq: Once | ORAL | Status: DC
  Filled 2019-07-22: qty 1

## 2019-07-22 MED ORDER — NAPROXEN 500 MG PO TABS: 500.0000 mg | ORAL_TABLET | Freq: Two times a day (BID) | ORAL | 0 refills | Status: DC

## 2019-07-22 NOTE — ED Provider Notes (Signed)
[facility] Emergency Department Provider Note   ____________________________________________   First MD Initiated Contact with Patient 07/22/19 1127     (approximate)  I have reviewed the triage vital signs and the nursing notes.   HISTORY  Chief Complaint Fall   HPI Andrea Miller is a 33 y.o. female presents to the ED with multiple injuries after she fell in the shower this morning.  Patient states that she is having pain in her right foot and left lower leg.  She also complains of her left hip hurting.  She denies any head injury or LOC.  She does report that she hit her lip and nose which bled briefly.  She denies any visual changes.  She rates her pain as a 10/10.       Past Medical History:  Diagnosis Date   Anxiety    Asthma    Bipolar disorder (HCC)    Chest pain    and angina   CHF (congestive heart failure) (HCC)    Depression    Fibromyalgia    Hypertension    Left lumbar radiculopathy since 08/2014   secondary to work accident   Obesity    Pre-diabetes    Pulmonary embolus (HCC)    Seizures (HCC)    secondary to anxiety  last seizure 01/25/18   SVT (supraventricular tachycardia) (HCC)    with hx of syncope; [facility]    Patient Active Problem List   Diagnosis Date Noted   Chronic anticoagulation 05/07/2019   Iron deficiency anemia due to chronic blood loss 10/15/2018   B12 deficiency 10/13/2018   Symptomatic anemia 10/11/2018   Multiple subsegmental pulmonary emboli without acute cor pulmonale 09/15/2018   Morbid obesity with BMI of 50.0-59.9, adult (HCC) 09/15/2018   Chronic diastolic heart failure (HCC) 07/01/2018   Lymphedema 07/01/2018   Chronic cholecystitis    Edema 02/13/2018   Diabetes mellitus type 2, uncomplicated (HCC) 02/13/2018   Hypertension 11/23/2017   Asthma exacerbation 04/14/2017   Asthma without status asthmaticus 01/16/2017   Bipolar affective (HCC)  01/16/2017   Coronary artery disease 01/16/2017   Syncope 12/12/2016   Fibromyalgia 04/27/2014   OSA (obstructive sleep apnea) 04/27/2014   Seizure-like activity (HCC) 04/27/2014   Migraine without status migrainosus, not intractable 04/27/2014   Obesity 04/15/2014   Sleep disorder 04/15/2014   Neck pain 04/15/2014   Headache 04/15/2014   Restless legs syndrome 04/15/2014   Supraventricular tachycardia (HCC) 07/13/2009    Past Surgical History:  Procedure Laterality Date   CHOLECYSTECTOMY N/A 02/22/2018   Procedure: LAPAROSCOPIC CHOLECYSTECTOMY;  Surgeon: Ancil Linseyavis, Jason Evan, MD;  Location: [facility] ORS;  Service: General;  Laterality: N/A;   TONSILLECTOMY AND ADENOIDECTOMY N/A 09/05/2016   Procedure: TONSILLECTOMY AND ADENOIDECTOMY;  Surgeon: Linus Salmonshapman McQueen, MD;  Location: [facility] ORS;  Service: ENT;  Laterality: N/A;    Prior to Admission medications   Medication Sig Start Date End Date Taking? Authorizing Provider  albuterol (PROVENTIL) (2.5 MG/3ML) 0.083% nebulizer solution Take 2.5 mg by nebulization every 4 (four) hours as needed for wheezing or shortness of breath.    [provider]  budesonide-formoterol (SYMBICORT) 160-4.5 MCG/ACT inhaler Inhale 2 puffs into the lungs 2 (two) times daily. 10/15/18   Shane Crutchamachandran, Pradeep, MD  Calcium-Magnesium 500-250 MG TABS Take 1 tablet by mouth 2 (two) times daily.     [provider]  clonazePAM (KLONOPIN) 1 MG tablet Take 1 tablet by mouth 2 (two) times a day. 04/02/19  [provider]  clonazePAM (KLONOPIN) 2 MG tablet Take 2 mg by mouth 3 (three) times daily.     [provider]  cyanocobalamin (,VITAMIN B-12,) 1000 MCG/ML injection Inject 1,000 mcg into the muscle every 30 (thirty) days.    [provider]  Dexlansoprazole (DEXILANT) 30 MG capsule Take 30 mg by mouth daily.    [provider]  diclofenac sodium (VOLTAREN) 1 % GEL Apply 4 g topically daily as needed (for  pain).    [provider]  diltiazem (CARDIZEM CD) 360 MG 24 hr capsule Take 360 mg by mouth every morning. 12/18/18   [provider]  escitalopram (LEXAPRO) 20 MG tablet Take 20 mg by mouth daily.    [provider]  fluticasone (FLONASE) 50 MCG/ACT nasal spray Place 2 sprays into both nostrils daily as needed for allergies.     [provider]  folic acid (FOLVITE) 1 MG tablet Take 1 mg by mouth daily.  12/15/16   [provider]  furosemide (LASIX) 20 MG tablet Take 40 mg by mouth 2 (two) times daily.     [provider]  gabapentin (NEURONTIN) 400 MG capsule Take 400 mg by mouth 3 (three) times daily.    [provider]  HYDROcodone-acetaminophen (NORCO/VICODIN) 5-325 MG tablet Take 1 tablet by mouth every 6 (six) hours as needed for moderate pain. 07/22/19   Johnn Hai, PA-C  hydroxypropyl methylcellulose / hypromellose (ISOPTO TEARS / GONIOVISC) 2.5 % ophthalmic solution Place 1 drop into both eyes 3 (three) times daily as needed for dry eyes.    [provider]  ibuprofen (ADVIL,MOTRIN) 600 MG tablet Take 600 mg by mouth every 8 (eight) hours as needed for mild pain or moderate pain.  12/25/18   [provider]  Iron-Vitamin C 65-125 MG TABS Take 1 tablet by mouth 2 (two) times daily. 05/20/19   Lequita Asal, MD  ketoconazole (NIZORAL) 2 % shampoo Apply 1 application topically every 30 (thirty) days.  10/01/18   [provider]  lactulose (CHRONULAC) 10 GM/15ML solution Take 30 g by mouth daily as needed for mild constipation.  08/28/18   [provider]  lamoTRIgine (LAMICTAL) 100 MG tablet Take 200 mg by mouth 2 (two) times daily.     [provider]  lamoTRIgine (LAMICTAL) 200 MG tablet Take 1 tablet by mouth 2 (two) times a day. 05/20/19   [provider]  linaclotide (LINZESS) 290 MCG CAPS capsule Take 290 mcg by mouth daily before breakfast.    [provider]  magic mouthwash w/lidocaine SOLN Take 5 mLs by mouth 4 (four) times daily. 06/09/19   Cuthriell, Charline Bills, PA-C  montelukast (SINGULAIR) 10 MG tablet Take 1 tablet (10 mg total) by mouth at bedtime. 10/15/18   Laverle Hobby, MD  naproxen (NAPROSYN) 500 MG tablet Take 1 tablet (500 mg total) by mouth 2 (two) times daily with a meal. 07/22/19   Johnn Hai, PA-C  Potassium Chloride ER 20 MEQ TBCR Take 20 mEq by mouth 2 (two) times daily.     [provider]  propranolol (INDERAL) 40 MG tablet Take 2 tablets (80 mg total) by mouth 3 (three) times daily. 10/13/18   Salary, Avel Peace, MD  risperiDONE (RISPERDAL) 3 MG tablet Take 3 mg by mouth at bedtime.     [provider]  rivaroxaban (XARELTO) 20 MG TABS tablet Take 20 mg by mouth every morning.    [provider]  rosuvastatin (CRESTOR) 20 MG tablet Take 20 mg by mouth daily.    [provider]  tiZANidine (ZANAFLEX) 4 MG tablet Take 4 mg by mouth 3 (three) times daily.  03/26/17   [provider]  Topiramate ER (TROKENDI XR) 200 MG CP24 Take 1 capsule by mouth daily.     [provider]  vitamin C (VITAMIN C) 250 MG tablet Take 1 tablet (250 mg total) by mouth 2 (two) times daily. 10/13/18   Salary, Evelena AsaMontell D, MD    Allergies Cortizone-10 [hydrocortisone], Garlic, Prednisone, and Corticosteroids  Family History  Problem Relation Age of Onset   Diabetes Mother    Hypertension Mother    Asthma Mother    Gallstones Mother    Diabetes Father    Hypertension Father    Gallstones Father    Bipolar disorder Sister    COPD Brother    Schizophrenia Brother    COPD Brother    Hypertension Brother     Social History Social History   Tobacco Use   Smoking status: Never Smoker   Smokeless tobacco: Never Used  Substance Use Topics   Alcohol use: No   Drug use: No    Review of Systems Constitutional: No fever/chills Eyes: No visual changes. ENT:  Positive nasal injury, upper lip injury. Cardiovascular: Denies chest pain. Respiratory: Denies shortness of breath. Gastrointestinal: No abdominal pain.  No nausea, no vomiting.  Genitourinary: Negative for dysuria. Musculoskeletal: Positive for right foot pain and left lower leg pain. Skin: Negative for rash. Neurological: Negative for headaches, focal weakness or numbness. ____________________________________________   PHYSICAL EXAM:  VITAL SIGNS: ED Triage Vitals  Enc Vitals Group     BP 07/22/19 1114 134/81     Pulse Rate 07/22/19 1114 91     Resp 07/22/19 1114 16     Temp 07/22/19 1114 99.2 F (37.3 C)     Temp Source 07/22/19 1114 Oral     SpO2 07/22/19 1114 100 %     Weight 07/22/19 1115 (!) 340 lb (154.2 kg)     Height 07/22/19 1115 5\' 10"  (1.778 m)     Head Circumference --      Peak Flow --      Pain Score 07/22/19 1115 10     Pain Loc --      Pain Edu? --      Excl. in GC? --    Constitutional: Alert and oriented. Well appearing and in no acute distress.  Talkative and answering questions. Eyes: Conjunctivae are normal. PERRL. EOMI. Head: Atraumatic. Nose: No gross deformities noted. Mouth/Throat: Mucous membranes are moist.  Oropharynx non-erythematous.  No active bleeding. Neck: No stridor.  Tender cervical spine to palpation posteriorly. Cardiovascular: Normal rate, regular rhythm. Grossly normal heart sounds.  Good peripheral circulation. Respiratory: Normal respiratory effort.  No retractions. Lungs CTAB. Gastrointestinal: Soft and nontender. No distention. No abdominal bruits. No CVA tenderness. Musculoskeletal: On exam there is no gross deformity.  Patient is able to move upper extremities without any difficulty.  On examination of the right foot plantar aspect there is some tenderness but no obvious deformity, ecchymosis or abrasions seen.  Skin is intact.  On examination of the left extremity there is diffuse tenderness to palpation of the tibia.  No soft  tissue edema or discoloration noted.  Pulses present bilaterally.  Patient is able to move digits distally without any difficulty.  Patient is ambulatory with slow guarded gait but unassisted.  No abnormal findings was noted  on palpation of the left hip.  No rotation or shortening of the lower extremity. Neurologic:  Normal speech and language. No gross focal neurologic deficits are appreciated. No gait instability. Skin:  Skin is warm, dry and intact.  Psychiatric: Mood and affect are normal. Speech and behavior are normal.  ____________________________________________   LABS (all labs ordered are listed, but only abnormal results are displayed)  Labs Reviewed  POCT PREGNANCY, URINE  POC URINE PREG, ED   RADIOLOGY  Official radiology report(s): Dg Pelvis 1-2 Views  Result Date: 07/22/2019 CLINICAL DATA:  Hip pain after fall EXAM: PELVIS - 1-2 VIEW COMPARISON:  03/16/2017 FINDINGS: There is no evidence of pelvic fracture or diastasis. No pelvic bone lesions are seen. IMPRESSION: Negative. Electronically Signed   By: Duanne GuessNicholas  Plundo M.D.   On: 07/22/2019 12:48   Dg Tibia/fibula Left  Result Date: 07/22/2019 CLINICAL DATA:  Left leg pain after fall EXAM: LEFT TIBIA AND FIBULA - 2 VIEW COMPARISON:  03/20/2009 FINDINGS: There is no evidence of fracture or other focal bone lesions. Soft tissues are unremarkable. IMPRESSION: Negative. Electronically Signed   By: Duanne GuessNicholas  Plundo M.D.   On: 07/22/2019 12:45   Dg Foot Complete Right  Result Date: 07/22/2019 CLINICAL DATA:  Right foot pain after fall EXAM: RIGHT FOOT COMPLETE - 3+ VIEW COMPARISON:  None. FINDINGS: There is no evidence of fracture or dislocation. There is no evidence of arthropathy or other focal bone abnormality. Soft tissues are unremarkable. IMPRESSION: Negative. Electronically Signed   By: Duanne GuessNicholas  Plundo M.D.   On: 07/22/2019 12:47    ____________________________________________   PROCEDURES  Procedure(s) performed  (including Critical Care):  Procedures   ____________________________________________   INITIAL IMPRESSION / ASSESSMENT AND PLAN / ED COURSE  As part of my medical decision making, I reviewed the following data within the electronic MEDICAL RECORD NUMBER Notes from prior ED visits and [place] Controlled Substance Database  33 year old female presents to the ED with complaint of right foot and left lower leg pain after falling in the shower this morning.  She denies any head injury or loss of consciousness.  She does have some minimal edema to her nasal area without obvious deformity or bleeding.  No dental injury was noted.  X-rays were reviewed with patient and no fracture was seen.  Patient was discharged with a prescription for Norco as needed for pain every 6 hours and Naprosyn.  Patient is encouraged to use ice and elevation.  She is to follow-up with her PCP if any continued problems.  ____________________________________________   FINAL CLINICAL IMPRESSION(S) / ED DIAGNOSES  Final diagnoses:  Contusion of multiple sites of lower extremity, unspecified laterality, initial encounter  Contusion of face, initial encounter  Fall in home, initial encounter     ED Discharge Orders         Ordered    HYDROcodone-acetaminophen (NORCO/VICODIN) 5-325 MG tablet  Every 6 hours PRN     07/22/19 1300    naproxen (NAPROSYN) 500 MG tablet  2 times daily with meals     07/22/19 1300           Note:  This document was prepared using Dragon voice recognition software and may include unintentional dictation errors.    Tommi RumpsSummers, An Schnabel L, PA-C 07/22/19 1530    Minna AntisPaduchowski, Kevin, MD 07/23/19 1714

## 2019-07-22 NOTE — ED Triage Notes (Signed)
Pt is ambulatory to triage, pt c/o slipping in the shower this morning and is having pain to BL ankles and left hip. States she busted her lip and had bleeding from the nose. Denies LOC. No active bleeding at this time

## 2019-07-22 NOTE — ED Notes (Signed)
See triage note  States she slipped in shower this am   Having pain to bottom of right foot and left lower leg   Also states she hit her face  Min swelling to nose

## 2019-07-22 NOTE — Discharge Instructions (Addendum)
Follow-up with your primary care provider if any continued problems or concerns.  Take pain medication as directed.  Do not drive or operate machinery while taking this medication.  Naproxen is for soreness, inflammation and pain.  You will need to take this with food.  Also ice and elevate your leg as needed for pain.  Return to the emergency department if any severe worsening of your symptoms.

## 2019-07-23 ENCOUNTER — Ambulatory Visit: Payer: 59 | Admitting: Hematology and Oncology

## 2019-07-23 ENCOUNTER — Ambulatory Visit: Payer: 59

## 2019-07-28 ENCOUNTER — Inpatient Hospital Stay: Payer: 59

## 2019-07-28 ENCOUNTER — Inpatient Hospital Stay: Payer: 59 | Admitting: Hematology and Oncology

## 2019-07-29 ENCOUNTER — Other Ambulatory Visit: Payer: Self-pay

## 2019-07-29 ENCOUNTER — Inpatient Hospital Stay: Payer: 59 | Attending: Hematology and Oncology

## 2019-07-29 DIAGNOSIS — D500 Iron deficiency anemia secondary to blood loss (chronic): Secondary | ICD-10-CM

## 2019-07-29 DIAGNOSIS — I2694 Multiple subsegmental pulmonary emboli without acute cor pulmonale: Secondary | ICD-10-CM

## 2019-07-29 DIAGNOSIS — Z7901 Long term (current) use of anticoagulants: Secondary | ICD-10-CM

## 2019-07-29 DIAGNOSIS — E538 Deficiency of other specified B group vitamins: Secondary | ICD-10-CM

## 2019-07-30 ENCOUNTER — Ambulatory Visit: Payer: 59

## 2019-07-30 ENCOUNTER — Ambulatory Visit: Payer: 59 | Admitting: Hematology and Oncology

## 2019-08-03 ENCOUNTER — Emergency Department (HOSPITAL_COMMUNITY)
Admission: EM | Admit: 2019-08-03 | Discharge: 2019-08-03 | Discharge status: Against Medical Advice | Payer: BLUE CROSS/BLUE SHIELD | Attending: Emergency Medicine | Admitting: Emergency Medicine

## 2019-08-03 ENCOUNTER — Emergency Department (HOSPITAL_COMMUNITY): Payer: BLUE CROSS/BLUE SHIELD

## 2019-08-03 ENCOUNTER — Encounter (HOSPITAL_COMMUNITY): Payer: Self-pay

## 2019-08-03 ENCOUNTER — Other Ambulatory Visit: Payer: Self-pay

## 2019-08-03 DIAGNOSIS — I2510 Atherosclerotic heart disease of native coronary artery without angina pectoris: Secondary | ICD-10-CM | POA: Insufficient documentation

## 2019-08-03 DIAGNOSIS — I509 Heart failure, unspecified: Secondary | ICD-10-CM | POA: Diagnosis not present

## 2019-08-03 DIAGNOSIS — E119 Type 2 diabetes mellitus without complications: Secondary | ICD-10-CM | POA: Insufficient documentation

## 2019-08-03 DIAGNOSIS — Z7901 Long term (current) use of anticoagulants: Secondary | ICD-10-CM | POA: Diagnosis not present

## 2019-08-03 DIAGNOSIS — I110 Hypertensive heart disease with heart failure: Secondary | ICD-10-CM | POA: Diagnosis not present

## 2019-08-03 DIAGNOSIS — Z5320 Procedure and treatment not carried out because of patient's decision for unspecified reasons: Secondary | ICD-10-CM | POA: Insufficient documentation

## 2019-08-03 DIAGNOSIS — J45909 Unspecified asthma, uncomplicated: Secondary | ICD-10-CM | POA: Insufficient documentation

## 2019-08-03 DIAGNOSIS — H1131 Conjunctival hemorrhage, right eye: Secondary | ICD-10-CM

## 2019-08-03 DIAGNOSIS — Z79899 Other long term (current) drug therapy: Secondary | ICD-10-CM | POA: Diagnosis not present

## 2019-08-03 DIAGNOSIS — S0990XD Unspecified injury of head, subsequent encounter: Secondary | ICD-10-CM | POA: Insufficient documentation

## 2019-08-03 DIAGNOSIS — W182XXD Fall in (into) shower or empty bathtub, subsequent encounter: Secondary | ICD-10-CM | POA: Diagnosis not present

## 2019-08-03 DIAGNOSIS — H5711 Ocular pain, right eye: Secondary | ICD-10-CM | POA: Diagnosis present

## 2019-08-03 MED ORDER — FLUORESCEIN SODIUM 1 MG OP STRP
1.0000 | ORAL_STRIP | Freq: Once | OPHTHALMIC | Status: AC
  Administered 2019-08-03: 1 via OPHTHALMIC
  Filled 2019-08-03: qty 1

## 2019-08-03 MED ORDER — TETRACAINE HCL 0.5 % OP SOLN
2.0000 [drp] | Freq: Once | OPHTHALMIC | Status: AC
  Administered 2019-08-03: 2 [drp] via OPHTHALMIC
  Filled 2019-08-03: qty 8

## 2019-08-03 NOTE — ED Provider Notes (Cosign Needed)
[facility]-EMERGENCY DEPT Provider Note   CSN: [id number] Arrival date & time: 08/03/19  1348     History   Chief Complaint Chief Complaint  Patient presents with   Eye Pain    HPI Andrea Miller is a 33 y.o. female with PMHx PE currently on Xarelto, bipolar disorder, CHF, depression, fibromyalgia, HTN who presents to the ED waning of gradual onset, constant, stabbing, right thigh pain after fall on 07/22/2019.  Patient states that she tripped and fell into the bathtub and hit her head on the wall.  She was initially seen at [facility] same day.  Per note patient denied any head injury or loss of consciousness at that time.  She did not have a CAT scan of believes and that was diagnosed in October 2019.  She states that a day or 2 after the injury she noticed some pain to her right eye.  She states that her eye has been red since then as well.  She has been applying any eyedrops to the eye.  The pain is present at rest and with eye movement.  She notes some watery drainage to the eye but otherwise has no complaints.        Past Medical History:  Diagnosis Date   Anxiety    Asthma    Bipolar disorder (HCC)    Chest pain    and angina   CHF (congestive heart failure) (HCC)    Depression    Fibromyalgia    Hypertension    Left lumbar radiculopathy since 08/2014   secondary to work accident   Obesity    Pre-diabetes    Pulmonary embolus (HCC)    Seizures (HCC)    secondary to anxiety  last seizure 01/25/18   SVT (supraventricular tachycardia) (HCC)    with hx of syncope; [facility]    Patient Active Problem List   Diagnosis Date Noted   Chronic anticoagulation 05/07/2019   Iron deficiency anemia due to chronic blood loss 10/15/2018   B12 deficiency 10/13/2018   Symptomatic anemia 10/11/2018   Multiple subsegmental pulmonary emboli without acute cor pulmonale 09/15/2018   Morbid obesity with BMI of 50.0-59.9, adult  (HCC) 09/15/2018   Chronic diastolic heart failure (HCC) 07/01/2018   Lymphedema 07/01/2018   Chronic cholecystitis    Edema 02/13/2018   Diabetes mellitus type 2, uncomplicated (HCC) 02/13/2018   Hypertension 11/23/2017   Asthma exacerbation 04/14/2017   Asthma without status asthmaticus 01/16/2017   Bipolar affective (HCC) 01/16/2017   Coronary artery disease 01/16/2017   Syncope 12/12/2016   Fibromyalgia 04/27/2014   OSA (obstructive sleep apnea) 04/27/2014   Seizure-like activity (HCC) 04/27/2014   Migraine without status migrainosus, not intractable 04/27/2014   Obesity 04/15/2014   Sleep disorder 04/15/2014   Neck pain 04/15/2014   Headache 04/15/2014   Restless legs syndrome 04/15/2014   Supraventricular tachycardia (HCC) 07/13/2009    Past Surgical History:  Procedure Laterality Date   CHOLECYSTECTOMY N/A 02/22/2018   Procedure: LAPAROSCOPIC CHOLECYSTECTOMY;  Surgeon: Ancil Linseyavis, Jason Evan, MD;  Location: [facility] ORS;  Service: General;  Laterality: N/A;   TONSILLECTOMY AND ADENOIDECTOMY N/A 09/05/2016   Procedure: TONSILLECTOMY AND ADENOIDECTOMY;  Surgeon: Linus Salmonshapman McQueen, MD;  Location: [facility] ORS;  Service: ENT;  Laterality: N/A;     OB History    Gravida  0   Para  0   Term  0   Preterm  0   AB  0   Living  0  SAB  0   TAB  0   Ectopic  0   Multiple  0   Live Births               Home Medications    Prior to Admission medications   Medication Sig Start Date End Date Taking? Authorizing Provider  albuterol (PROVENTIL) (2.5 MG/3ML) 0.083% nebulizer solution Take 2.5 mg by nebulization every 4 (four) hours as needed for wheezing or shortness of breath.    [provider]  budesonide-formoterol (SYMBICORT) 160-4.5 MCG/ACT inhaler Inhale 2 puffs into the lungs 2 (two) times daily. 10/15/18   Laverle Hobby, MD  Calcium-Magnesium 500-250 MG TABS Take 1 tablet by mouth 2 (two) times daily.     [provider]  clonazePAM (KLONOPIN) 1 MG tablet Take 1 tablet by mouth 2 (two) times a day. 04/02/19   [provider]  clonazePAM (KLONOPIN) 2 MG tablet Take 2 mg by mouth 3 (three) times daily.     [provider]  cyanocobalamin (,VITAMIN B-12,) 1000 MCG/ML injection Inject 1,000 mcg into the muscle every 30 (thirty) days.    [provider]  Dexlansoprazole (DEXILANT) 30 MG capsule Take 30 mg by mouth daily.    [provider]  diclofenac sodium (VOLTAREN) 1 % GEL Apply 4 g topically daily as needed (for pain).    [provider]  diltiazem (CARDIZEM CD) 360 MG 24 hr capsule Take 360 mg by mouth every morning. 12/18/18   [provider]  escitalopram (LEXAPRO) 20 MG tablet Take 20 mg by mouth daily.    [provider]  fluticasone (FLONASE) 50 MCG/ACT nasal spray Place 2 sprays into both nostrils daily as needed for allergies.     [provider]  folic acid (FOLVITE) 1 MG tablet Take 1 mg by mouth daily.  12/15/16   [provider]  furosemide (LASIX) 20 MG tablet Take 40 mg by mouth 2 (two) times daily.     [provider]  gabapentin (NEURONTIN) 400 MG capsule Take 400 mg by mouth 3 (three) times daily.    [provider]  HYDROcodone-acetaminophen (NORCO/VICODIN) 5-325 MG tablet Take 1 tablet by mouth every 6 (six) hours as needed for moderate pain. 07/22/19   Johnn Hai, PA-C  hydroxypropyl methylcellulose / hypromellose (ISOPTO TEARS / GONIOVISC) 2.5 % ophthalmic solution Place 1 drop into both eyes 3 (three) times daily as needed for dry eyes.    [provider]  ibuprofen (ADVIL,MOTRIN) 600 MG tablet Take 600 mg by mouth every 8 (eight) hours as needed for mild pain or moderate pain.  12/25/18   [provider]  Iron-Vitamin C 65-125 MG TABS Take 1 tablet by mouth 2 (two) times daily. 05/20/19   Lequita Asal, MD  ketoconazole (NIZORAL) 2 % shampoo Apply 1  application topically every 30 (thirty) days.  10/01/18   [provider]  lactulose (CHRONULAC) 10 GM/15ML solution Take 30 g by mouth daily as needed for mild constipation.  08/28/18   [provider]  lamoTRIgine (LAMICTAL) 100 MG tablet Take 200 mg by mouth 2 (two) times daily.     [provider]  lamoTRIgine (LAMICTAL) 200 MG tablet Take 1 tablet by mouth 2 (two) times a day. 05/20/19   [provider]  linaclotide (LINZESS) 290 MCG CAPS capsule Take 290 mcg by mouth daily before breakfast.    [provider]  magic mouthwash w/lidocaine SOLN Take 5 mLs by mouth  4 (four) times daily. 06/09/19   Cuthriell, Delorise RoyalsJonathan D, PA-C  montelukast (SINGULAIR) 10 MG tablet Take 1 tablet (10 mg total) by mouth at bedtime. 10/15/18   Shane Crutchamachandran, Pradeep, MD  naproxen (NAPROSYN) 500 MG tablet Take 1 tablet (500 mg total) by mouth 2 (two) times daily with a meal. 07/22/19   Tommi RumpsSummers, Rhonda L, PA-C  Potassium Chloride ER 20 MEQ TBCR Take 20 mEq by mouth 2 (two) times daily.     [provider]  propranolol (INDERAL) 40 MG tablet Take 2 tablets (80 mg total) by mouth 3 (three) times daily. 10/13/18   Salary, Evelena AsaMontell D, MD  risperiDONE (RISPERDAL) 3 MG tablet Take 3 mg by mouth at bedtime.     [provider]  rivaroxaban (XARELTO) 20 MG TABS tablet Take 20 mg by mouth every morning.    [provider]  rosuvastatin (CRESTOR) 20 MG tablet Take 20 mg by mouth daily.    [provider]  tiZANidine (ZANAFLEX) 4 MG tablet Take 4 mg by mouth 3 (three) times daily.  03/26/17   [provider]  Topiramate ER (TROKENDI XR) 200 MG CP24 Take 1 capsule by mouth daily.     [provider]  vitamin C (VITAMIN C) 250 MG tablet Take 1 tablet (250 mg total) by mouth 2 (two) times daily. 10/13/18   Salary, Evelena AsaMontell D, MD    Family History Family History  Problem Relation Age of Onset   Diabetes Mother    Hypertension Mother     Asthma Mother    Gallstones Mother    Diabetes Father    Hypertension Father    Gallstones Father    Bipolar disorder Sister    COPD Brother    Schizophrenia Brother    COPD Brother    Hypertension Brother     Social History Social History   Tobacco Use   Smoking status: Never Smoker   Smokeless tobacco: Never Used  Substance Use Topics   Alcohol use: No   Drug use: No     Allergies   Cortizone-10 [hydrocortisone], Garlic, Prednisone, and Corticosteroids   Review of Systems Review of Systems  Constitutional: Negative for chills and fever.  Eyes: Positive for pain and redness.  All other systems reviewed and are negative.    Physical Exam Updated Vital Signs BP 128/86 (BP Location: Left Arm)    Pulse 78    Temp 99.5 F (37.5 C) (Oral)    Resp 20    Wt (!) 147.4 kg    LMP 07/31/2019    SpO2 97%    BMI 46.63 kg/m   Physical Exam Vitals signs and nursing note reviewed.  Constitutional:      Appearance: She is not ill-appearing.  HENT:     Head: Normocephalic and atraumatic.  Eyes:     Extraocular Movements: Extraocular movements intact.     Conjunctiva/sclera:     Right eye: Hemorrhage present.     Pupils: Pupils are equal, round, and reactive to light.     Right eye: No fluorescein uptake. Seidel exam negative.     Slit lamp exam:    Right eye: No hyphema, hypopyon or anterior chamber flares.     Comments: Visual Acuity: 20/70 in right eye, 20/25 in left eye.  Neck:     Musculoskeletal: Neck supple.  Cardiovascular:     Rate and Rhythm: Normal rate and regular rhythm.  Pulmonary:     Effort: Pulmonary effort is normal.  Breath sounds: Normal breath sounds.  Abdominal:     Palpations: Abdomen is soft.     Tenderness: There is no abdominal tenderness.  Skin:    General: Skin is warm and dry.  Neurological:     Mental Status: She is alert.      ED Treatments / Results  Labs (all labs ordered are listed, but only abnormal results  are displayed) Labs Reviewed - No data to display  EKG None  Radiology No results found.  Procedures Procedures (including critical care time)  Medications Ordered in ED Medications  fluorescein ophthalmic strip 1 strip (has no administration in time range)  tetracaine (PONTOCAINE) 0.5 % ophthalmic solution 2 drop (has no administration in time range)     Initial Impression / Assessment and Plan / ED Course  I have reviewed the triage vital signs and the nursing notes.  Pertinent labs & imaging results that were available during my care of the patient were reviewed by me and considered in my medical decision making (see chart for details).    33 year old female who presents to the ED with pain and redness to her right eye after falling approximately 10 days ago.  Initially seen [facility] and had denied head injury at that time.  Patient is anticoagulated.  Did not have a CAT scan of her head at [facility].  Patient has obvious conjunctival hemorrhage on exam. She does note tenderness with EOM although EOM are intact. No fluorosceine uptake. No seidel sign. No retinal hemorrhage appreciated. Given pt is anticoagulated and did not have CT scan done at time of injury will obtain CT head and CT Max today to rule out any acute process including orbital fractures.   At shift change case signed out to Sharen Hecklaudia Gibbons, PA-C, who will dispo patient accordingly. If no acute findings on CT scan pt can be discharged home with PCP follow up.        Final Clinical Impressions(s) / ED Diagnoses   Final diagnoses:  Pain of right eye  Conjunctival hemorrhage of right eye    ED Discharge Orders    None       Tanda RockersVenter, Nya Monds, [place] JerseyPA-C 08/03/19 1732

## 2019-08-03 NOTE — ED Triage Notes (Signed)
Pt's eye is red and bloodshot. Pt states that this started shortly after her fall 07/22/19.   20/70 in right eye, 20/25 in left eye.

## 2019-08-03 NOTE — ED Provider Notes (Signed)
1745: Pt handed off to me by previous EDPA Alroy Bailiff at shift change. Please see previous note for full H&P.  Plan is to f/u on CT and likely discharge.   Physical Exam  BP (!) 133/105 (BP Location: Left Arm)   Pulse 76   Temp 98.4 F (36.9 C) (Oral)   Resp 20   Wt (!) 147.4 kg   LMP 07/31/2019   SpO2 97%   BMI 46.63 kg/m   Physical Exam Constitutional:      Appearance: She is well-developed.  HENT:     Head: Normocephalic.     Comments: No right sided facial, orbital, maxillary or mandibular tenderness, deficit, crepitus or ecchymosis     Nose: Nose normal.  Eyes:     General: Lids are normal.     Extraocular Movements: Extraocular movements intact.     Conjunctiva/sclera:     Right eye: Hemorrhage present.      Comments: RIGHT EYE: PERRL. No direct or consensual photosensitivity. EOMs intact with mild pain. Upper/lower lids without erythema, edema, tenderness, warmth, palpable mass, lesions.  Conjunctival injection/hemorrhage to medial aspect of the eye, no chemosis.  No proptosis.  No limbic flush.  IOP: done by previous provider. Fluorescein uptake: none per previous provider. Visual acuity: 20/75. No cell or flare on slit lamp exam.   LEFT EYE: PERRL.  No direct or consensual photosensitivity. EOMs intact, painless. Upper/lower lids without erythema, edema, tenderness, warmth, palpable mass, lesions.  Conjunctiva and sclera white without prominent vessels.  No limbic flush.  IOP: done by previous provider. Fluorescein uptake: n/a. Visual acuity: 20/25.  No cell or flare on slit lamp exam.   Neck:     Musculoskeletal: Normal range of motion.  Cardiovascular:     Rate and Rhythm: Normal rate.  Pulmonary:     Effort: Pulmonary effort is normal. No respiratory distress.  Musculoskeletal: Normal range of motion.  Neurological:     Mental Status: She is alert.  Psychiatric:        Behavior: Behavior normal.     ED Course/Procedures   Clinical Course as of Aug 02 1952  Sun  Aug 03, 2019  1951 IMPRESSION: 1. No acute intracranial abnormality. 2. No evidence of facial fractures. 3. Mild preseptal right periorbital swelling. 4. Right frontal sinus opacification, possibly posttraumatic.  CT Maxillofacial WO CM [CG]    Clinical Course User Index [CG] Kinnie Feil, PA-C    Procedures  MDM   1948: I performed slit exam.  No direct or consensual photosensitivity.  No chemosis, proptosis, limbic flush.  No cell or flare on slit exam.  IOP and fluorescein uptake done by previous provider reportedly normal.  No signs of infectious conjunctivitis.  Given trauma, blurred vision, right eye concern for subclinical traumatic iritis.  No signs of retinal detachment, open globe injury, foreign body, abrasion, glaucoma, cellulitis.  I spoke to Dr. Marshall Cork given holiday weekend I wanted to ensure patient followed up closely in the office for reevaluation.  He agrees there could be subclinical traumatic iritis.  He will see patient at 8:30 AM tomorrow Monday, defer steroid drops for now until his exam.  I was notified by RN that patient left prior to her CT results.  She was encouraged to stay but she ultimately walked out and declined waiting.  Was not able to speak to patient prior to this but called her x2 and left her voicemail to notify her that she needed to contact Dr. Kathlen Mody tomorrow morning  and had an expected appointment at 830.  We also attempted to call her emergency contact mother but unable to contact.  CT reviewed by me and radiology shows preseptal right periorbital swelling and frontal sinus opacification likely post traumatic. No ICH or fractures. Pt has full EOMs. Some blurred vision but supposed to wear glasses and not wearing them currently.    Liberty Handy, PA-C 08/03/19 1953    Virgina Norfolk, DO 08/03/19 2003

## 2019-08-03 NOTE — ED Notes (Signed)
Pt states that she was not waiting any longer for her CT results. I encouraged her to stay and told her the importance of waiting on results. She declined. Ambulated out of the ED in no distress.

## 2019-08-07 ENCOUNTER — Ambulatory Visit: Payer: 59

## 2019-08-07 ENCOUNTER — Other Ambulatory Visit: Payer: Self-pay

## 2019-08-08 ENCOUNTER — Inpatient Hospital Stay: Payer: 59

## 2019-08-08 NOTE — Progress Notes (Signed)
Patient is coming in Monday for follow up, she fell about two weeks ago and mentions her eye is bothering her. Advised to touch base with pcp or eyedoctor. She is doing well only mentions feeling very tired.

## 2019-08-11 ENCOUNTER — Inpatient Hospital Stay: Payer: 59

## 2019-08-11 ENCOUNTER — Inpatient Hospital Stay: Payer: 59 | Admitting: Hematology and Oncology

## 2019-08-12 ENCOUNTER — Other Ambulatory Visit: Payer: Self-pay

## 2019-08-13 ENCOUNTER — Inpatient Hospital Stay: Payer: 59

## 2019-08-18 ENCOUNTER — Inpatient Hospital Stay: Payer: 59

## 2019-08-18 ENCOUNTER — Inpatient Hospital Stay: Payer: 59 | Admitting: Nurse Practitioner

## 2019-09-05 ENCOUNTER — Inpatient Hospital Stay: Payer: Medicare HMO | Attending: Hematology and Oncology

## 2019-09-18 ENCOUNTER — Other Ambulatory Visit: Payer: Self-pay

## 2019-09-18 ENCOUNTER — Inpatient Hospital Stay: Payer: Medicare HMO | Attending: Hematology and Oncology

## 2019-09-18 DIAGNOSIS — E538 Deficiency of other specified B group vitamins: Secondary | ICD-10-CM | POA: Diagnosis not present

## 2019-09-18 DIAGNOSIS — D500 Iron deficiency anemia secondary to blood loss (chronic): Secondary | ICD-10-CM | POA: Insufficient documentation

## 2019-09-18 DIAGNOSIS — I2694 Multiple subsegmental pulmonary emboli without acute cor pulmonale: Secondary | ICD-10-CM | POA: Insufficient documentation

## 2019-09-18 LAB — CBC WITH DIFFERENTIAL/PLATELET
Abs Immature Granulocytes: 0.01 10*3/uL (ref 0.00–0.07)
Basophils Absolute: 0 10*3/uL (ref 0.0–0.1)
Basophils Relative: 1 %
Eosinophils Absolute: 0.1 10*3/uL (ref 0.0–0.5)
Eosinophils Relative: 1 %
HCT: 37.8 % (ref 36.0–46.0)
Hemoglobin: 11.6 g/dL — ABNORMAL LOW (ref 12.0–15.0)
Immature Granulocytes: 0 %
Lymphocytes Relative: 54 %
Lymphs Abs: 2.4 10*3/uL (ref 0.7–4.0)
MCH: 24.4 pg — ABNORMAL LOW (ref 26.0–34.0)
MCHC: 30.7 g/dL (ref 30.0–36.0)
MCV: 79.4 fL — ABNORMAL LOW (ref 80.0–100.0)
Monocytes Absolute: 0.3 10*3/uL (ref 0.1–1.0)
Monocytes Relative: 8 %
Neutro Abs: 1.6 10*3/uL — ABNORMAL LOW (ref 1.7–7.7)
Neutrophils Relative %: 36 %
Platelets: 245 10*3/uL (ref 150–400)
RBC: 4.76 MIL/uL (ref 3.87–5.11)
RDW: 15.9 % — ABNORMAL HIGH (ref 11.5–15.5)
WBC: 4.4 10*3/uL (ref 4.0–10.5)
nRBC: 0 % (ref 0.0–0.2)

## 2019-09-18 LAB — FERRITIN: Ferritin: 10 ng/mL — ABNORMAL LOW (ref 11–307)

## 2019-09-19 ENCOUNTER — Telehealth: Payer: Self-pay | Admitting: Hematology and Oncology

## 2019-09-19 ENCOUNTER — Other Ambulatory Visit: Payer: Self-pay

## 2019-09-19 ENCOUNTER — Telehealth: Payer: Self-pay

## 2019-09-19 DIAGNOSIS — I2694 Multiple subsegmental pulmonary emboli without acute cor pulmonale: Secondary | ICD-10-CM

## 2019-09-19 DIAGNOSIS — D500 Iron deficiency anemia secondary to blood loss (chronic): Secondary | ICD-10-CM

## 2019-09-19 MED ORDER — RIVAROXABAN 20 MG PO TABS: 20.0000 mg | ORAL_TABLET | Freq: Every day | ORAL | 0 refills | Status: DC

## 2019-09-19 MED ORDER — IRON-VITAMIN C 65-125 MG PO TABS: 1.0000 | ORAL_TABLET | Freq: Two times a day (BID) | ORAL | 1 refills | Status: DC

## 2019-09-19 NOTE — Telephone Encounter (Signed)
Re:  Fatigue and nausea  Spoke to patient about symptoms of fatigue and nausea.  She notes no increase shortness of breath or bleeding. Menses unchanged.  She denies pregnancy.  She denies any diarrhea or fever.  Review of recent labs notes a hematocrit 37.8, hemoglobin 11.6, and MCV 79.4.  Ferritin is 10. She is still iron deficient.  She is unsure if the oral iron is causing her nausea.   We discussed the possibility of IV iron.  IV access has been an issue.  We discussed checking chemistries (CMP) as there may be another issue going on.   She made an appointment with Dr Clide Deutscher in mid November.  She was agreeaable to an appointment on Monday, 09/22/2019, in symptom management.   I recommended evaluation in the ER or Urgent Care if she had any concerns or felt worse.   She did not think she needed to go to the ER.   Lequita Asal, MD

## 2019-09-19 NOTE — Telephone Encounter (Signed)
Spoke with the patient to inform her of her lab results /  ferritin 10 but hbg has increase from 10.3 to 11.6. The patient has reported that she is feeling very tired and weak with N&V x 1-2 weeks and requested that Dr Mike Gip prescribe Zofran . I have informed Dr Mike Gip . I will reach back out to the patient with instruction from dr corcoran. The patient was understanding and agreeable.

## 2019-09-22 ENCOUNTER — Other Ambulatory Visit: Payer: Self-pay | Admitting: Oncology

## 2019-09-22 ENCOUNTER — Encounter: Payer: Medicare HMO | Admitting: Oncology

## 2019-09-22 ENCOUNTER — Inpatient Hospital Stay: Payer: Medicare HMO

## 2019-09-22 ENCOUNTER — Other Ambulatory Visit: Payer: Self-pay

## 2019-09-22 DIAGNOSIS — D500 Iron deficiency anemia secondary to blood loss (chronic): Secondary | ICD-10-CM

## 2019-09-22 LAB — COMPREHENSIVE METABOLIC PANEL
ALT: 9 U/L (ref 0–44)
AST: 16 U/L (ref 15–41)
Albumin: 4.1 g/dL (ref 3.5–5.0)
Alkaline Phosphatase: 77 U/L (ref 38–126)
Anion gap: 7 (ref 5–15)
BUN: 8 mg/dL (ref 6–20)
CO2: 26 mmol/L (ref 22–32)
Calcium: 9.2 mg/dL (ref 8.9–10.3)
Chloride: 106 mmol/L (ref 98–111)
Creatinine, Ser: 0.66 mg/dL (ref 0.44–1.00)
GFR calc Af Amer: >60 mL/min (ref >60)
GFR calc non Af Amer: >60 mL/min (ref >60)
Glucose, Bld: 104 mg/dL — ABNORMAL HIGH (ref 70–99)
Potassium: 3.6 mmol/L (ref 3.5–5.1)
Sodium: 139 mmol/L (ref 135–145)
Total Bilirubin: 0.4 mg/dL (ref 0.3–1.2)
Total Protein: 7.7 g/dL (ref 6.5–8.1)

## 2019-09-22 LAB — CBC WITH DIFFERENTIAL/PLATELET
Abs Immature Granulocytes: 0.01 10*3/uL (ref 0.00–0.07)
Basophils Absolute: 0 10*3/uL (ref 0.0–0.1)
Basophils Relative: 1 %
Eosinophils Absolute: 0.1 10*3/uL (ref 0.0–0.5)
Eosinophils Relative: 1 %
HCT: 38 % (ref 36.0–46.0)
Hemoglobin: 11.5 g/dL — ABNORMAL LOW (ref 12.0–15.0)
Immature Granulocytes: 0 %
Lymphocytes Relative: 46 %
Lymphs Abs: 2.4 10*3/uL (ref 0.7–4.0)
MCH: 24.5 pg — ABNORMAL LOW (ref 26.0–34.0)
MCHC: 30.3 g/dL (ref 30.0–36.0)
MCV: 80.9 fL (ref 80.0–100.0)
Monocytes Absolute: 0.5 10*3/uL (ref 0.1–1.0)
Monocytes Relative: 9 %
Neutro Abs: 2.3 10*3/uL (ref 1.7–7.7)
Neutrophils Relative %: 43 %
Platelets: 271 10*3/uL (ref 150–400)
RBC: 4.7 MIL/uL (ref 3.87–5.11)
RDW: 16.2 % — ABNORMAL HIGH (ref 11.5–15.5)
WBC: 5.3 10*3/uL (ref 4.0–10.5)
nRBC: 0 % (ref 0.0–0.2)

## 2019-09-22 LAB — MAGNESIUM: Magnesium: 2.1 mg/dL (ref 1.7–2.4)

## 2019-09-23 ENCOUNTER — Telehealth: Payer: Self-pay | Admitting: Oncology

## 2019-09-23 ENCOUNTER — Encounter: Payer: Self-pay | Admitting: Pulmonary Disease

## 2019-09-23 ENCOUNTER — Ambulatory Visit: Payer: Medicare HMO | Admitting: Pulmonary Disease

## 2019-09-23 VITALS — BP 124/88 | HR 80 | Temp 98.2°F | Ht 70.0 in | Wt 357.8 lb

## 2019-09-23 DIAGNOSIS — R002 Palpitations: Secondary | ICD-10-CM

## 2019-09-23 DIAGNOSIS — J45909 Unspecified asthma, uncomplicated: Secondary | ICD-10-CM

## 2019-09-23 MED ORDER — MONTELUKAST SODIUM 10 MG PO TABS: 10.0000 mg | ORAL_TABLET | Freq: Every day | ORAL | 1 refills | Status: DC

## 2019-09-23 MED ORDER — BUDESONIDE-FORMOTEROL FUMARATE 160-4.5 MCG/ACT IN AERO: 2.0000 | INHALATION_SPRAY | Freq: Two times a day (BID) | RESPIRATORY_TRACT | 4 refills | Status: DC

## 2019-09-23 MED ORDER — ALBUTEROL SULFATE 108 (90 BASE) MCG/ACT IN AEPB: 2.0000 | INHALATION_SPRAY | Freq: Four times a day (QID) | RESPIRATORY_TRACT | 5 refills | Status: DC | PRN

## 2019-09-23 MED ORDER — ALBUTEROL SULFATE (2.5 MG/3ML) 0.083% IN NEBU: 2.5000 mg | INHALATION_SOLUTION | RESPIRATORY_TRACT | 0 refills | Status: AC | PRN

## 2019-09-23 NOTE — Telephone Encounter (Signed)
Re: fatigue  Patient asked to be evaluated in our symptom management clinic but unfortunately showed up 1 hour late.  Had labs drawn which revealed normocytic anemia.   Lab Results  Component Value Date   WBC 5.3 09/22/2019   HGB 11.5 (L) 09/22/2019   HCT 38.0 09/22/2019   MCV 80.9 09/22/2019   PLT 271 09/22/2019   Lab Results  Component Value Date   FERRITIN 10 (L) 69/50/7225   Last metabolic panel is within normal limits.  Spoke with Dr. Mike Gip and patient can be set up for IV iron infusion.  Unfortunately, has poor venous access and was unable to get IV iron in the past.  Is currently taking oral iron but does not think it is helping.  Patient requesting a B12 injection as well.  Lab Results  Component Value Date   [id number] 10/11/2018   Scheduling will call her with the next available appointment.  Faythe Casa, NP 09/23/2019 9:14 AM

## 2019-09-23 NOTE — Patient Instructions (Signed)
1.  We are setting you up with new cardiologist to continue follow-up on your palpitations.  2.  We will schedule breathing tests as you have not had them since 2018 this will be to evaluate your increasing shortness of breath.  3.  You have had refills on your Symbicort, montelukast, albuterol (nebulizer and inhaler).  He also have received spacer for use with your inhalers.  4.  Continue Xarelto as directed by Dr. Mike Gip.  As we discussed initially it looks like you had a high lupus anticoagulant however, follow-up test showed that this was not the case.  It appears that your blood clot occurred because of increased weight use of birth control pills.  This can be a common issue.  Continue Xarelto as directed.  5.  We will schedule you for our sleep clinic to continue to follow  your sleep apnea.  6.  We will see you back in 3 months time.

## 2019-10-06 ENCOUNTER — Ambulatory Visit: Payer: 59

## 2019-10-07 ENCOUNTER — Inpatient Hospital Stay: Payer: Medicare HMO

## 2019-10-07 ENCOUNTER — Inpatient Hospital Stay: Payer: Medicare HMO | Attending: Oncology | Admitting: Oncology

## 2019-10-07 ENCOUNTER — Other Ambulatory Visit: Payer: Self-pay

## 2019-10-07 DIAGNOSIS — Z833 Family history of diabetes mellitus: Secondary | ICD-10-CM | POA: Insufficient documentation

## 2019-10-07 DIAGNOSIS — D500 Iron deficiency anemia secondary to blood loss (chronic): Secondary | ICD-10-CM | POA: Diagnosis present

## 2019-10-07 DIAGNOSIS — E538 Deficiency of other specified B group vitamins: Secondary | ICD-10-CM | POA: Diagnosis not present

## 2019-10-07 DIAGNOSIS — Z818 Family history of other mental and behavioral disorders: Secondary | ICD-10-CM | POA: Insufficient documentation

## 2019-10-07 DIAGNOSIS — Z8379 Family history of other diseases of the digestive system: Secondary | ICD-10-CM | POA: Diagnosis not present

## 2019-10-07 DIAGNOSIS — N920 Excessive and frequent menstruation with regular cycle: Secondary | ICD-10-CM | POA: Insufficient documentation

## 2019-10-07 DIAGNOSIS — J45909 Unspecified asthma, uncomplicated: Secondary | ICD-10-CM | POA: Insufficient documentation

## 2019-10-07 DIAGNOSIS — E119 Type 2 diabetes mellitus without complications: Secondary | ICD-10-CM | POA: Diagnosis not present

## 2019-10-07 DIAGNOSIS — Z836 Family history of other diseases of the respiratory system: Secondary | ICD-10-CM | POA: Diagnosis not present

## 2019-10-07 DIAGNOSIS — Z8249 Family history of ischemic heart disease and other diseases of the circulatory system: Secondary | ICD-10-CM | POA: Diagnosis not present

## 2019-10-07 DIAGNOSIS — Z79899 Other long term (current) drug therapy: Secondary | ICD-10-CM | POA: Diagnosis not present

## 2019-10-07 MED ORDER — CYANOCOBALAMIN 1000 MCG/ML IJ SOLN
1000.0000 ug | Freq: Once | INTRAMUSCULAR | Status: AC
  Administered 2019-10-07: 1000 ug via INTRAMUSCULAR
  Filled 2019-10-07: qty 1

## 2019-10-07 MED ORDER — SODIUM CHLORIDE 0.9 % IV SOLN
Freq: Once | INTRAVENOUS | Status: AC
  Administered 2019-10-07: 15:00:00 via INTRAVENOUS
  Filled 2019-10-07: qty 250

## 2019-10-07 MED ORDER — IRON SUCROSE 20 MG/ML IV SOLN
200.0000 mg | Freq: Once | INTRAVENOUS | Status: AC
  Administered 2019-10-07: 200 mg via INTRAVENOUS
  Filled 2019-10-07: qty 10

## 2019-10-15 ENCOUNTER — Inpatient Hospital Stay: Payer: Medicare HMO

## 2019-10-19 NOTE — Progress Notes (Signed)
This encounter was created in error - please disregard.

## 2019-10-20 ENCOUNTER — Other Ambulatory Visit: Payer: Self-pay

## 2019-10-20 ENCOUNTER — Other Ambulatory Visit: Payer: Self-pay | Admitting: Surgery

## 2019-10-20 ENCOUNTER — Inpatient Hospital Stay: Payer: Medicare HMO

## 2019-10-20 VITALS — BP 129/86 | HR 95 | Temp 96.0°F | Resp 18

## 2019-10-20 DIAGNOSIS — E538 Deficiency of other specified B group vitamins: Secondary | ICD-10-CM

## 2019-10-20 DIAGNOSIS — D500 Iron deficiency anemia secondary to blood loss (chronic): Secondary | ICD-10-CM | POA: Diagnosis not present

## 2019-10-20 DIAGNOSIS — G453 Amaurosis fugax: Secondary | ICD-10-CM

## 2019-10-20 MED ORDER — SODIUM CHLORIDE 0.9 % IV SOLN
Freq: Once | INTRAVENOUS | Status: AC
  Administered 2019-10-20: 15:00:00 via INTRAVENOUS
  Filled 2019-10-20: qty 250

## 2019-10-20 MED ORDER — IRON SUCROSE 20 MG/ML IV SOLN
200.0000 mg | Freq: Once | INTRAVENOUS | Status: AC
  Administered 2019-10-20: 200 mg via INTRAVENOUS
  Filled 2019-10-20: qty 10

## 2019-10-28 NOTE — Progress Notes (Signed)
[facility]  [address], [address] [place], [place] [address] Phone: [phone]  Fax: [phone]   Telemedicine Office Visit:  10/30/2019  Referring physician: Emogene MorganAycock, Ngwe A, MD  I connected with Andrea Miller on 10/30/2019 at 4:07 PM by videoconferencing and verified that I was speaking with the correct person using 2 identifiers.  The patient was at the nail salon.  I discussed the limitations, risk, security and privacy concerns of performing an evaluation and management service by videoconferencing and the availability of in person appointments.  I also discussed with the patient that there may be a patient responsible charge related to this service.  The patient expressed understanding and agreed to proceed.   Chief Complaint: Andrea Miller is a 33 y.o. female  with pulmonary embolism, iron deficiency anemia secondary to menorrhagia,andB12 deficiency who is seen for6 monthassessment.   HPI: The patient was last seen in the hematology clinic on 05/07/2019. At that time, she had been fatigued for the past month.  Exam was unremarkable. Hemoglobin 12.3. MCV 81.8. Ferritin was 11 (low). Patient received a B12 injection. She continued Xarelto.   Patient received a B12 injection on 10/07/2019. She received Venofer on 10/07/2019 and 10/20/2019.  I spoke to patient about symptoms of fatigue and nausea on 09/19/2019.  She denied any increased shortness of breath or bleeding. Menses were unchanged. She denied any pregnancy, diarrhea, or fever. Labs on 09/18/2019 noted a hematocrit 37.8, hemoglobin 11.6, and MCV 79.4.  Ferritin was 10. She was iron deficient. She was unsure if the oral iron was causing her nausea.   We discussed the possibility of IV iron.  IV access had been an issue. We discussed checking chemistries as there may have been another issue going on.  She made an appointment with Dr Letta PateAycock for mid November. She was agreeable to an appointment on  Monday, 09/22/2019, in symptom management.  I recommended evaluation in the ER or Urgent Care if she had any concerns or felt worse.  She did not think she needed to go to the ER.   She was scheduled to be seen in the symptom management clinic on 09/22/2019.  She was unable to be evaluated as she was an hour late.  Labs revealed a hematocrit 38.0, hemoglobin 11.5, MCV 80.9, platelets 271,000, WBC 5,300. CMP was normal.  During the interim, she has felt good. She had shortness of breath and chest pain 3 weeks ago which has since resolved on Xarelto. She stopped her Xarelto during her last menstrual cycle. She has been taking Xarelto for 2 months. She has no bruising. She notes bleeding. The patient does not want to be taken of off blood thinners. She is not on oral iron or vitamin C. Her gynecologist is [facility] in [place], [place].   She notes right eye redness and pain and a "really bad" headache related to falling in the tub back in 07/2019. She notes intermittent change in vision in her right eye.  She reports that she has a head MRI scheduled on 10/31/2019.  She denies any recent falls.    Past Medical History:  Diagnosis Date  . Anxiety   . Asthma   . Bipolar disorder (HCC)   . Chest pain    and angina  . CHF (congestive heart failure) (HCC)   . Depression   . Fibromyalgia   . Hypertension   . Left lumbar radiculopathy since 08/2014   secondary to work  accident  . Obesity   . Pre-diabetes   . Pulmonary embolus (HCC)   . Seizures (HCC)    secondary to anxiety  last seizure 01/25/18  . SVT (supraventricular tachycardia) (HCC)    with hx of syncope; managed by [facility]    Past Surgical History:  Procedure Laterality Date  . CHOLECYSTECTOMY N/A 02/22/2018   Procedure: LAPAROSCOPIC CHOLECYSTECTOMY;  Surgeon: Ancil Linsey, MD;  Location: [facility] ORS;  Service: General;  Laterality: N/A;  . TONSILLECTOMY AND ADENOIDECTOMY N/A 09/05/2016   Procedure:  TONSILLECTOMY AND ADENOIDECTOMY;  Surgeon: Linus Salmons, MD;  Location: [facility] ORS;  Service: ENT;  Laterality: N/A;    Family History  Problem Relation Age of Onset  . Diabetes Mother   . Hypertension Mother   . Asthma Mother   . Gallstones Mother   . Diabetes Father   . Hypertension Father   . Gallstones Father   . Bipolar disorder Sister   . COPD Brother   . Schizophrenia Brother   . COPD Brother   . Hypertension Brother     Social History:  reports that she has never smoked. She has never used smokeless tobacco. She reports that she does not drink alcohol or use drugs. No tobacco, or illegal subatance use. Prior to her back issues, patient was employed full time at [employer]. The patient is alone today.  Participants in the patient's visit and their role in the encounter included the patient Delano Metz, RN, today.  The intake visit was provided by Delano Metz, RN.   Allergies:  Allergies  Allergen Reactions  . Cortizone-10 [Hydrocortisone] Shortness Of Breath and Swelling  . Garlic Anaphylaxis  . Prednisone Swelling    Tongue swelling  . Corticosteroids Palpitations    Current Medications: Current Outpatient Medications  Medication Sig Dispense Refill  . Adapalene 0.3 % gel Apply topically at bedtime.     Marland Kitchen albuterol (PROVENTIL) (2.5 MG/3ML) 0.083% nebulizer solution Take 3 mLs (2.5 mg total) by nebulization every 4 (four) hours as needed for wheezing or shortness of breath. 360 mL 0  . Albuterol Sulfate 108 (90 Base) MCG/ACT AEPB Inhale 2 puffs into the lungs every 6 (six) hours as needed. 1 each 5  . budesonide-formoterol (SYMBICORT) 160-4.5 MCG/ACT inhaler Inhale 2 puffs into the lungs 2 (two) times daily. 1 Inhaler 4  . busPIRone (BUSPAR) 10 MG tablet Take 10 mg by mouth 2 (two) times daily.     . Calcium-Magnesium 500-250 MG TABS Take 1 tablet by mouth 2 (two) times daily.     . clonazePAM (KLONOPIN) 1 MG tablet Take 1 tablet by mouth 2 (two) times  a day.    . cyanocobalamin (,VITAMIN B-12,) 1000 MCG/ML injection Inject 1,000 mcg into the muscle every 30 (thirty) days.    Marland Kitchen Dexlansoprazole (DEXILANT) 30 MG capsule Take 30 mg by mouth daily.    . diclofenac sodium (VOLTAREN) 1 % GEL Apply 4 g topically daily as needed (for pain).    Marland Kitchen diltiazem (CARDIZEM CD) 360 MG 24 hr capsule Take 360 mg by mouth every morning.    . escitalopram (LEXAPRO) 20 MG tablet Take 20 mg by mouth daily.    . fluticasone (FLONASE) 50 MCG/ACT nasal spray Place 2 sprays into both nostrils daily as needed for allergies.     . folic acid (FOLVITE) 1 MG tablet Take 1 mg by mouth daily.   10  . furosemide (LASIX) 20 MG tablet Take 40 mg by mouth 2 (two) times  daily.     . gabapentin (NEURONTIN) 400 MG capsule Take 400 mg by mouth 3 (three) times daily.    . hydroxypropyl methylcellulose / hypromellose (ISOPTO TEARS / GONIOVISC) 2.5 % ophthalmic solution Place 1 drop into both eyes 3 (three) times daily as needed for dry eyes.    Marland Kitchen. ketoconazole (NIZORAL) 2 % shampoo Apply 1 application topically every 30 (thirty) days.   1  . lactulose (CHRONULAC) 10 GM/15ML solution Take 30 g by mouth daily as needed for mild constipation.   5  . lamoTRIgine (LAMICTAL) 200 MG tablet Take 1 tablet by mouth 2 (two) times a day.    . linaclotide (LINZESS) 290 MCG CAPS capsule Take 290 mcg by mouth daily before breakfast.    . montelukast (SINGULAIR) 10 MG tablet Take 1 tablet (10 mg total) by mouth at bedtime. 90 tablet 1  . Potassium Chloride ER 20 MEQ TBCR Take 20 mEq by mouth 2 (two) times daily.     . prednisoLONE acetate (PRED FORTE) 1 % ophthalmic suspension Place 1 drop into the right eye 4 (four) times daily.     . propranolol (INDERAL) 40 MG tablet Take 2 tablets (80 mg total) by mouth 3 (three) times daily. 90 tablet 0  . risperiDONE (RISPERDAL) 3 MG tablet Take 3 mg by mouth at bedtime.     . rivaroxaban (XARELTO) 20 MG TABS tablet Take 20 mg by mouth every morning.    .  rosuvastatin (CRESTOR) 20 MG tablet Take 20 mg by mouth daily.    Marland Kitchen. tiZANidine (ZANAFLEX) 4 MG tablet Take 4 mg by mouth 3 (three) times daily.   1  . Topiramate ER (TROKENDI XR) 200 MG CP24 Take 1 capsule by mouth daily.     . Iron-Vitamin C 65-125 MG TABS Take 1 tablet by mouth 2 (two) times daily. (Patient not taking: Reported on 10/29/2019) 60 tablet 1  . vitamin C (VITAMIN C) 250 MG tablet Take 1 tablet (250 mg total) by mouth 2 (two) times daily. (Patient not taking: Reported on 10/29/2019) 60 tablet 0   No current facility-administered medications for this visit.     Review of Systems  Constitutional: Negative.  Negative for chills, diaphoresis, fever, malaise/fatigue and weight loss.       Feels "good".  HENT: Negative.  Negative for congestion, ear pain, hearing loss, nosebleeds, sinus pain and sore throat.   Eyes: Positive for redness (s/p fall in 07/2019). Negative for blurred vision, double vision and photophobia.       Intermittent right vision change s/p fall in 07/2019.  Respiratory: Negative.  Negative for cough, sputum production, shortness of breath (with minimal exertion 3 weeks ago; resolved) and wheezing.   Cardiovascular: Negative.  Negative for chest pain (3 weeks ago, resolved), palpitations, orthopnea, claudication and leg swelling.  Gastrointestinal: Negative.  Negative for abdominal pain, blood in stool, constipation, diarrhea, melena, nausea and vomiting.  Genitourinary: Negative for dysuria, frequency, hematuria and urgency.       Heavy periods.  Musculoskeletal: Negative.  Negative for back pain (disc problems), falls (07/2019), joint pain and myalgias.  Skin: Negative.  Negative for rash.  Neurological: Positive for headaches (s/p fall on 07/2019). Negative for dizziness, tingling, sensory change, speech change, focal weakness and weakness.  Endo/Heme/Allergies: Bruises/bleeds easily.  Psychiatric/Behavioral: Negative.  Negative for depression and memory loss. The  patient is not nervous/anxious and does not have insomnia.   All other systems reviewed and are negative.   Performance status (ECOG):  1  Physical Exam  Constitutional: She is oriented to person, place, and time. She appears well-developed and well-nourished. No distress.  HENT:  Head: Normocephalic and atraumatic.  Long curly hair.  Eyes: Conjunctivae and EOM are normal. No scleral icterus.  Brown eyes.  Neurological: She is alert and oriented to person, place, and time.  Skin: She is not diaphoretic.  Psychiatric: She has a normal mood and affect. Her behavior is normal. Judgment and thought content normal.  Nursing note reviewed.   No visits with results within 3 Day(s) from this visit.  Latest known visit with results is:  Appointment on 09/22/2019  Component Date Value Ref Range Status  . Magnesium 09/22/2019 2.1  1.7 - 2.4 mg/dL Final   Performed at [facility], [address], [place], [place] [address]  . Sodium 09/22/2019 139  135 - 145 mmol/L Final  . Potassium 09/22/2019 3.6  3.5 - 5.1 mmol/L Final  . Chloride 09/22/2019 106  98 - 111 mmol/L Final  . CO2 09/22/2019 26  22 - 32 mmol/L Final  . Glucose, Bld 09/22/2019 104* 70 - 99 mg/dL Final  . BUN 09/22/2019 8  6 - 20 mg/dL Final  . Creatinine, Ser 09/22/2019 0.66  0.44 - 1.00 mg/dL Final  . Calcium 09/22/2019 9.2  8.9 - 10.3 mg/dL Final  . Total Protein 09/22/2019 7.7  6.5 - 8.1 g/dL Final  . Albumin 09/22/2019 4.1  3.5 - 5.0 g/dL Final  . AST 09/22/2019 16  15 - 41 U/L Final  . ALT 09/22/2019 9  0 - 44 U/L Final  . Alkaline Phosphatase 09/22/2019 77  38 - 126 U/L Final  . Total Bilirubin 09/22/2019 0.4  0.3 - 1.2 mg/dL Final  . GFR calc non Af Amer 09/22/2019 >60  >60 mL/min Final  . GFR calc Af Amer 09/22/2019 >60  >60 mL/min Final  . Anion gap 09/22/2019 7  5 - 15 Final   Performed at [facility], [address], [place], [place] [address]  . WBC 09/22/2019 5.3  4.0 - 10.5 K/uL Final  . RBC  09/22/2019 4.70  3.87 - 5.11 MIL/uL Final  . Hemoglobin 09/22/2019 11.5* 12.0 - 15.0 g/dL Final  . HCT 09/22/2019 38.0  36.0 - 46.0 % Final  . MCV 09/22/2019 80.9  80.0 - 100.0 fL Final  . MCH 09/22/2019 24.5* 26.0 - 34.0 pg Final  . MCHC 09/22/2019 30.3  30.0 - 36.0 g/dL Final  . RDW 09/22/2019 16.2* 11.5 - 15.5 % Final  . Platelets 09/22/2019 271  150 - 400 K/uL Final  . nRBC 09/22/2019 0.0  0.0 - 0.2 % Final  . Neutrophils Relative % 09/22/2019 43  % Final  . Neutro Abs 09/22/2019 2.3  1.7 - 7.7 K/uL Final  . Lymphocytes Relative 09/22/2019 46  % Final  . Lymphs Abs 09/22/2019 2.4  0.7 - 4.0 K/uL Final  . Monocytes Relative 09/22/2019 9  % Final  . Monocytes Absolute 09/22/2019 0.5  0.1 - 1.0 K/uL Final  . Eosinophils Relative 09/22/2019 1  % Final  . Eosinophils Absolute 09/22/2019 0.1  0.0 - 0.5 K/uL Final  . Basophils Relative 09/22/2019 1  % Final  . Basophils Absolute 09/22/2019 0.0  0.0 - 0.1 K/uL Final  . Immature Granulocytes 09/22/2019 0  % Final  . Abs Immature Granulocytes 09/22/2019 0.01  0.00 - 0.07 K/uL Final   Performed at [facility], [address], [place], [place] [address]  Assessment:  Andrea Miller is a 33 y.o. female with small bilateral pulmonary emboli. Risk factors for thrombosis include obesity and birth control pills.  Chest CT angiogramon 09/15/2018 revealed small bilateral pulmonary emboli and mild cardiomegaly. Bilateral lower extremity duplexon 09/16/2018 revealed no evidence of lower extremity DVT.   Hypercoagulable work-upon 10/11/2018 revealed the following negative studies: factor V Leiden, prothrombin gene mutation, anti-cardiolipin antibodies, and beta-2 glycoprotein antibodies. Lupus anticoagulant testing was positive (on Xarelto).Protein C, protein S, and ATIII were normal on 01/15/2019. Lupus anticoagulant testing was negative on 04/24/2019.  She has iron deficiency anemia.She notes a recent history of extremely  heavy mensesx 3 weeks (none in past 2 weeks). Hemoglobin was 10.5 on 10/29/2019and 6.5 on 10/10/2018. Work-up on 10/10/2018 revealed a ferritinof 4, iron saturation 3%. She hasice pica.She received 3 units of PRBCs. She is on oral iron.  She received Venofer on 10/07/2019 and 10/20/2019.  Ferritinhas been followed: 4 on 10/11/2018, 7 on 01/15/2019, 11 on 04/24/2019, and 10 on 09/18/2019.  She has B12 deficiency. B12 was 242 on 10/10/2018. She began B12 injectionson 10/13/2018 (last 10/07/2019). Diet has been protein shakes and Weight Watcher's.  Symptomatically, she notes interval shortness of breath when she stopped taking Xarelto during her menses.  Symptoms have resolved.  She notes intermittent right visual changes.  Plan: 1.   Iron deficiency anemia Hematocrit 38.0.  Hemoglobin 11.5. MCV 80.9. Ferritin 10 on 09/18/2019. She received Venofer on 10/07/2019 and 10/20/2019. Ferritin goal 100.    Plan for additional Venofer if ferritin < 30. 2. B12 deficiency             Continue monthly B12 (last 10/07/2019). 3. Menorrhagia Patient notes ongoing heavy menses. Follow-up with [facility] [phone]). 4. Pulmonary embolism Patientremains on Xarelto. Etiology of thrombosis felt secondary to weight and birth control pills Repeat lupus anticoagulant testing was negative. Discuss importance of Xarelto and not holding Xarelto with menses. Discuss length of anticoagulation at next visit. 5.   RTC on 11/04/2019 in [place] for labs (CBC with diff, CMP, ferritin, iron studies, folate) and B12 injection. 6.   RTC in 1 month for MD assessment and discussion regarding length of anticoagulation.  I discussed the assessment and treatment plan with the patient.  The patient was provided an opportunity to ask questions and all were answered.  The patient agreed with the plan and demonstrated an understanding of the instructions.  The patient was advised to call  back or seek an in person evaluation if the symptoms worsen or if the condition fails to improve as anticipated.  I provided 18 minutes (4:07 PM - 4:25 PM) of face-to-face video visit time during this this encounter and > 50% was spent counseling as documented under my assessment and plan.  I provided these services from the [facility] office.   Nelva Nay, MD, PhD  10/30/2019, 4:07 PM  I, Theador Hawthorne, am acting as scribe for [employer]. Merlene Pulling, MD, PhD.  I, Melissa C. Merlene Pulling, MD, have reviewed the above documentation for accuracy and completeness, and I agree with the above.

## 2019-10-29 ENCOUNTER — Other Ambulatory Visit: Payer: Medicaid Other

## 2019-10-29 ENCOUNTER — Inpatient Hospital Stay: Payer: Medicare HMO | Attending: Hematology and Oncology

## 2019-10-29 ENCOUNTER — Other Ambulatory Visit: Payer: Self-pay

## 2019-10-29 ENCOUNTER — Ambulatory Visit: Payer: Medicaid Other | Admitting: Hematology and Oncology

## 2019-10-29 DIAGNOSIS — E538 Deficiency of other specified B group vitamins: Secondary | ICD-10-CM | POA: Insufficient documentation

## 2019-10-29 DIAGNOSIS — D649 Anemia, unspecified: Secondary | ICD-10-CM

## 2019-10-29 DIAGNOSIS — D500 Iron deficiency anemia secondary to blood loss (chronic): Secondary | ICD-10-CM | POA: Insufficient documentation

## 2019-10-29 NOTE — Progress Notes (Signed)
Confirmed Name & DOB. Denies any concerns.  

## 2019-10-30 ENCOUNTER — Other Ambulatory Visit: Payer: Medicaid Other

## 2019-10-30 ENCOUNTER — Inpatient Hospital Stay: Payer: Medicare HMO

## 2019-10-30 ENCOUNTER — Inpatient Hospital Stay: Payer: Medicare HMO | Attending: Hematology and Oncology | Admitting: Hematology and Oncology

## 2019-10-30 ENCOUNTER — Encounter: Payer: Self-pay | Admitting: Hematology and Oncology

## 2019-10-30 DIAGNOSIS — E538 Deficiency of other specified B group vitamins: Secondary | ICD-10-CM | POA: Diagnosis not present

## 2019-10-30 DIAGNOSIS — I2694 Multiple subsegmental pulmonary emboli without acute cor pulmonale: Secondary | ICD-10-CM

## 2019-10-30 DIAGNOSIS — D500 Iron deficiency anemia secondary to blood loss (chronic): Secondary | ICD-10-CM | POA: Diagnosis not present

## 2019-11-04 ENCOUNTER — Inpatient Hospital Stay: Payer: Medicare HMO

## 2019-11-05 ENCOUNTER — Other Ambulatory Visit: Payer: Self-pay | Admitting: Surgery

## 2019-11-06 ENCOUNTER — Other Ambulatory Visit: Payer: Self-pay | Admitting: Surgery

## 2019-11-07 ENCOUNTER — Ambulatory Visit: Payer: Medicare HMO

## 2019-11-09 ENCOUNTER — Other Ambulatory Visit: Payer: Medicaid Other

## 2019-11-10 ENCOUNTER — Other Ambulatory Visit: Payer: Self-pay

## 2019-11-10 ENCOUNTER — Encounter: Payer: Self-pay | Admitting: Emergency Medicine

## 2019-11-10 ENCOUNTER — Emergency Department: Payer: Medicare HMO

## 2019-11-10 DIAGNOSIS — E119 Type 2 diabetes mellitus without complications: Secondary | ICD-10-CM | POA: Diagnosis not present

## 2019-11-10 DIAGNOSIS — Z20828 Contact with and (suspected) exposure to other viral communicable diseases: Secondary | ICD-10-CM | POA: Diagnosis not present

## 2019-11-10 DIAGNOSIS — I259 Chronic ischemic heart disease, unspecified: Secondary | ICD-10-CM | POA: Insufficient documentation

## 2019-11-10 DIAGNOSIS — I110 Hypertensive heart disease with heart failure: Secondary | ICD-10-CM | POA: Insufficient documentation

## 2019-11-10 DIAGNOSIS — I5032 Chronic diastolic (congestive) heart failure: Secondary | ICD-10-CM | POA: Insufficient documentation

## 2019-11-10 DIAGNOSIS — R0602 Shortness of breath: Secondary | ICD-10-CM | POA: Insufficient documentation

## 2019-11-10 DIAGNOSIS — R0789 Other chest pain: Secondary | ICD-10-CM | POA: Diagnosis present

## 2019-11-10 DIAGNOSIS — Z79899 Other long term (current) drug therapy: Secondary | ICD-10-CM | POA: Diagnosis not present

## 2019-11-10 DIAGNOSIS — Z7901 Long term (current) use of anticoagulants: Secondary | ICD-10-CM | POA: Diagnosis not present

## 2019-11-10 DIAGNOSIS — J45909 Unspecified asthma, uncomplicated: Secondary | ICD-10-CM | POA: Diagnosis not present

## 2019-11-10 DIAGNOSIS — I2699 Other pulmonary embolism without acute cor pulmonale: Secondary | ICD-10-CM | POA: Insufficient documentation

## 2019-11-10 LAB — BASIC METABOLIC PANEL
Anion gap: 6 (ref 5–15)
BUN: 9 mg/dL (ref 6–20)
CO2: 31 mmol/L (ref 22–32)
Calcium: 9.3 mg/dL (ref 8.9–10.3)
Chloride: 102 mmol/L (ref 98–111)
Creatinine, Ser: 0.84 mg/dL (ref 0.44–1.00)
GFR calc Af Amer: >60 mL/min (ref >60)
GFR calc non Af Amer: >60 mL/min (ref >60)
Glucose, Bld: 118 mg/dL — ABNORMAL HIGH (ref 70–99)
Potassium: 4 mmol/L (ref 3.5–5.1)
Sodium: 139 mmol/L (ref 135–145)

## 2019-11-10 LAB — CBC
HCT: 36.7 % (ref 36.0–46.0)
Hemoglobin: 11.6 g/dL — ABNORMAL LOW (ref 12.0–15.0)
MCH: 25.3 pg — ABNORMAL LOW (ref 26.0–34.0)
MCHC: 31.6 g/dL (ref 30.0–36.0)
MCV: 80.1 fL (ref 80.0–100.0)
Platelets: 284 10*3/uL (ref 150–400)
RBC: 4.58 MIL/uL (ref 3.87–5.11)
RDW: 17.7 % — ABNORMAL HIGH (ref 11.5–15.5)
WBC: 5.1 10*3/uL (ref 4.0–10.5)
nRBC: 0 % (ref 0.0–0.2)

## 2019-11-10 LAB — TROPONIN I (HIGH SENSITIVITY): Troponin I (High Sensitivity): 2 ng/L (ref <18)

## 2019-11-10 MED ORDER — SODIUM CHLORIDE 0.9% FLUSH: 3.0000 mL | Freq: Once | INTRAVENOUS | Status: DC

## 2019-11-10 NOTE — ED Triage Notes (Signed)
Pt presents from home with chest pain, sob, and loss of taste. She had a covid test on Monday of last week and it was negative. She is taking cough medicine and is feeling worse rather than better. Pt alert & oriented, NAD noted.

## 2019-11-11 ENCOUNTER — Emergency Department
Admission: EM | Admit: 2019-11-11 | Discharge: 2019-11-11 | Disposition: A | Discharge status: Home / Self Care | Payer: Medicare HMO | Attending: Emergency Medicine | Admitting: Emergency Medicine

## 2019-11-11 DIAGNOSIS — R0789 Other chest pain: Secondary | ICD-10-CM | POA: Diagnosis not present

## 2019-11-11 DIAGNOSIS — Z20822 Contact with and (suspected) exposure to covid-19: Secondary | ICD-10-CM

## 2019-11-11 LAB — PROCALCITONIN: Procalcitonin: <0.1 ng/mL

## 2019-11-11 LAB — FIBRIN DERIVATIVES D-DIMER (ARMC ONLY): Fibrin derivatives D-dimer (ARMC): 369.76 ng/mL (FEU) (ref 0.00–499.00)

## 2019-11-11 LAB — SARS CORONAVIRUS 2 (TAT 6-24 HRS): SARS Coronavirus 2: NEGATIVE

## 2019-11-11 LAB — TROPONIN I (HIGH SENSITIVITY): Troponin I (High Sensitivity): 3 ng/L (ref <18)

## 2019-11-11 MED ORDER — ONDANSETRON HCL 4 MG/2ML IJ SOLN
INTRAMUSCULAR | Status: AC
  Filled 2019-11-11: qty 2

## 2019-11-11 MED ORDER — ONDANSETRON HCL 4 MG/2ML IJ SOLN
4.0000 mg | Freq: Once | INTRAMUSCULAR | Status: AC
  Administered 2019-11-11: 4 mg via INTRAVENOUS

## 2019-11-11 MED ORDER — SODIUM CHLORIDE 0.9 % IV BOLUS
1000.0000 mL | Freq: Once | INTRAVENOUS | Status: AC
  Administered 2019-11-11: 1000 mL via INTRAVENOUS

## 2019-11-11 MED ORDER — ONDANSETRON 4 MG PO TBDP: 4.0000 mg | ORAL_TABLET | Freq: Three times a day (TID) | ORAL | 0 refills | Status: DC | PRN

## 2019-11-11 MED ORDER — ACETAMINOPHEN 500 MG PO TABS
1000.0000 mg | ORAL_TABLET | Freq: Once | ORAL | Status: AC
  Administered 2019-11-11: 1000 mg via ORAL
  Filled 2019-11-11: qty 2

## 2019-11-11 NOTE — ED Notes (Signed)
Attempted to stick pt. 2 times, unsuccessful each time. Told nurse Amy, she called lab to come and try.

## 2019-11-11 NOTE — ED Notes (Signed)
Pt ambulatory in room, maintaining 100% RA.

## 2019-11-11 NOTE — ED Provider Notes (Addendum)
[facility] Emergency Department Provider Note  ____________________________________________  Time seen: Approximately 2:55 AM  I have reviewed the triage vital signs and the nursing notes.   HISTORY  Chief Complaint Chest Pain and Shortness of Breath   HPI Andrea Miller is a 33 y.o. female with a history of obesity, asthma, CHF with preserved EF, hypertension, PE on Xarelto, B12 deficiency anemia, fibromyalgia who presents for evaluation of Covid-like symptoms.  Patient reports 7 to 8 days of symptoms which includes mild chest pressure, shortness of breath, wheezing, dry cough, loss of taste or smell, nausea, several daily episodes of nonbloody nonbilious emesis, body aches, extreme fatigue, and headache.  Patient was tested for Covid a week ago which was negative.  She does not recall any known exposures.  She endorses compliance with her Xarelto.   No leg pain or swelling, no hemoptysis.  Past Medical History:  Diagnosis Date  . Anxiety   . Asthma   . Bipolar disorder (HCC)   . Chest pain    and angina  . CHF (congestive heart failure) (HCC)   . Depression   . Fibromyalgia   . Hypertension   . 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 Obesity   . Pre-diabetes   . Pulmonary embolus (HCC)   . Seizures (HCC)    secondary to anxiety  last seizure 01/25/18  . SVT (supraventricular tachycardia) (HCC)    with hx of syncope; [facility]    Patient Active Problem List   Diagnosis Date Noted  . Chronic anticoagulation 05/07/2019  . Iron deficiency anemia due to chronic blood loss 10/15/2018  . B12 deficiency 10/13/2018  . Symptomatic anemia 10/11/2018  . Multiple subsegmental pulmonary emboli without acute cor pulmonale (HCC) 09/15/2018  . Morbid obesity with BMI of 50.0-59.9, adult (HCC) 09/15/2018  . Chronic diastolic heart failure (HCC) 07/01/2018  . Lymphedema 07/01/2018  . Chronic cholecystitis   .  Edema 02/13/2018  . Diabetes mellitus type 2, uncomplicated (HCC) 02/13/2018  . Hypertension 11/23/2017  . Asthma exacerbation 04/14/2017  . Asthma without status asthmaticus 01/16/2017  . Bipolar affective (HCC) 01/16/2017  . Coronary artery disease 01/16/2017  . Syncope 12/12/2016  . Fibromyalgia 04/27/2014  . OSA (obstructive sleep apnea) 04/27/2014  . Seizure-like activity (HCC) 04/27/2014  . Migraine without status migrainosus, not intractable 04/27/2014  . Obesity 04/15/2014  . Sleep disorder 04/15/2014  . Neck pain 04/15/2014  . Headache 04/15/2014  . Restless legs syndrome 04/15/2014  . Supraventricular tachycardia (HCC) 07/13/2009    Past Surgical History:  Procedure Laterality Date  . CHOLECYSTECTOMY N/A 02/22/2018   Procedure: LAPAROSCOPIC CHOLECYSTECTOMY;  Surgeon: Ancil Linseyavis, Jason Evan, MD;  Location: [facility] ORS;  Service: General;  Laterality: N/A;  . TONSILLECTOMY AND ADENOIDECTOMY N/A 09/05/2016   Procedure: TONSILLECTOMY AND ADENOIDECTOMY;  Surgeon: Linus Salmonshapman McQueen, MD;  Location: [facility] ORS;  Service: ENT;  Laterality: N/A;    Prior to Admission medications   Medication Sig Start Date End Date Taking? Authorizing Provider  Adapalene 0.3 % gel Apply topically at bedtime.  10/20/19   [provider]  albuterol (PROVENTIL) (2.5 MG/3ML) 0.083% nebulizer solution Take 3 mLs (2.5 mg total) by nebulization every 4 (four) hours as needed for wheezing or shortness of breath. 09/23/19 10/29/19  Salena SanerGonzalez, Carmen L, MD  Albuterol Sulfate 108 (90 Base) MCG/ACT AEPB Inhale 2 puffs into the lungs every 6 (six) hours as needed. 09/23/19 10/29/19  Salena SanerGonzalez, Carmen L, MD  budesonide-formoterol (SYMBICORT) 160-4.5 MCG/ACT inhaler Inhale 2 puffs into the lungs 2 (two) times daily. 09/23/19 10/29/19  Salena SanerGonzalez, Carmen L, MD  busPIRone (BUSPAR) 10 MG tablet Take 10 mg by mouth 2 (two) times daily.  10/22/19   [provider]  Calcium-Magnesium 500-250 MG TABS Take 1 tablet by  mouth 2 (two) times daily.     [provider]  clonazePAM (KLONOPIN) 1 MG tablet Take 1 tablet by mouth 2 (two) times a day. 04/02/19   [provider]  cyanocobalamin (,VITAMIN B-12,) 1000 MCG/ML injection Inject 1,000 mcg into the muscle every 30 (thirty) days.    [provider]  Dexlansoprazole (DEXILANT) 30 MG capsule Take 30 mg by mouth daily.    [provider]  diclofenac sodium (VOLTAREN) 1 % GEL Apply 4 g topically daily as needed (for pain).    [provider]  diltiazem (CARDIZEM CD) 360 MG 24 hr capsule Take 360 mg by mouth every morning. 12/18/18   [provider]  escitalopram (LEXAPRO) 20 MG tablet Take 20 mg by mouth daily.    [provider]  fluticasone (FLONASE) 50 MCG/ACT nasal spray Place 2 sprays into both nostrils daily as needed for allergies.     [provider]  folic acid (FOLVITE) 1 MG tablet Take 1 mg by mouth daily.  12/15/16   [provider]  furosemide (LASIX) 20 MG tablet Take 40 mg by mouth 2 (two) times daily.     [provider]  gabapentin (NEURONTIN) 400 MG capsule Take 400 mg by mouth 3 (three) times daily.    [provider]  hydroxypropyl methylcellulose / hypromellose (ISOPTO TEARS / GONIOVISC) 2.5 % ophthalmic solution Place 1 drop into both eyes 3 (three) times daily as needed for dry eyes.    [provider]  Iron-Vitamin C 65-125 MG TABS Take 1 tablet by mouth 2 (two) times daily. Patient not taking: Reported on 10/29/2019 09/19/19   Rosey Bathorcoran, Melissa C, MD  ketoconazole (NIZORAL) 2 % shampoo Apply 1 application topically every 30 (thirty) days.  10/01/18   [provider]  lactulose (CHRONULAC) 10 GM/15ML solution Take 30 g by mouth daily as needed for mild constipation.  08/28/18   [provider]  lamoTRIgine (LAMICTAL) 200 MG tablet Take 1 tablet by mouth 2 (two) times a day. 05/20/19   [provider]  linaclotide  (LINZESS) 290 MCG CAPS capsule Take 290 mcg by mouth daily before breakfast.    [provider]  montelukast (SINGULAIR) 10 MG tablet Take 1 tablet (10 mg total) by mouth at bedtime. 09/23/19 12/22/19  Salena SanerGonzalez, Carmen L, MD  ondansetron (ZOFRAN ODT) 4 MG disintegrating tablet Take 1 tablet (4 mg total) by mouth every 8 (eight) hours as needed. 11/11/19   Nita SickleVeronese, Follansbee, MD  Potassium Chloride ER 20 MEQ TBCR Take 20 mEq by mouth 2 (two) times daily.     [provider]  prednisoLONE acetate (PRED FORTE) 1 % ophthalmic suspension Place 1 drop into the right eye 4 (four) times daily.  10/15/19   [provider]  propranolol (INDERAL) 40 MG tablet Take 2 tablets (80 mg total) by mouth 3 (three) times daily. 10/13/18   Salary, Evelena AsaMontell D, MD  risperiDONE (RISPERDAL) 3 MG tablet Take 3 mg by mouth at bedtime.     [provider]  rivaroxaban (XARELTO) 20 MG TABS tablet Take 20 mg by mouth every morning.    [provider]  rosuvastatin (CRESTOR) 20  MG tablet Take 20 mg by mouth daily.    [provider]  tiZANidine (ZANAFLEX) 4 MG tablet Take 4 mg by mouth 3 (three) times daily.  03/26/17   [provider]  Topiramate ER (TROKENDI XR) 200 MG CP24 Take 1 capsule by mouth daily.     [provider]  vitamin C (VITAMIN C) 250 MG tablet Take 1 tablet (250 mg total) by mouth 2 (two) times daily. Patient not taking: Reported on 10/29/2019 10/13/18   Salary, Evelena Asa, MD    Allergies Cortizone-10 [hydrocortisone], Garlic, Prednisone, and Corticosteroids  Family History  Problem Relation Age of Onset  . Diabetes Mother   . Hypertension Mother   . Asthma Mother   . Gallstones Mother   . Diabetes Father   . Hypertension Father   . Gallstones Father   . Bipolar disorder Sister   . COPD Brother   . Schizophrenia Brother   . COPD Brother   . Hypertension Brother     Social History Social History   Tobacco Use  . Smoking  status: Never Smoker  . Smokeless tobacco: Never Used  Substance Use Topics  . Alcohol use: No  . Drug use: No    Review of Systems  Constitutional: Negative for fever. + fatigue, body aches, loss of taste and smell Eyes: Negative for visual changes. ENT: Negative for sore throat. Neck: No neck pain  Cardiovascular: Negative for chest pain. + Chest tightness Respiratory: + shortness of breath, wheezing, cough Gastrointestinal: Negative for abdominal pain,diarrhea. + N/V Genitourinary: Negative for dysuria. Musculoskeletal: Negative for back pain. Skin: Negative for rash. Neurological: Negative for weakness or numbness. + HA Psych: No SI or HI  ____________________________________________   PHYSICAL EXAM:  VITAL SIGNS: ED Triage Vitals  Enc Vitals Group     BP 11/10/19 2038 (!) 163/112     Pulse Rate 11/10/19 2038 (!) 119     Resp 11/10/19 2038 18     Temp 11/10/19 2038 99.6 F (37.6 C)     Temp Source 11/10/19 2038 Oral     SpO2 11/10/19 2038 100 %     Weight 11/10/19 2039 (!) 340 lb (154.2 kg)     Height 11/10/19 2039 5\' 10"  (1.778 m)     Head Circumference --      Peak Flow --      Pain Score 11/10/19 2039 8     Pain Loc --      Pain Edu? --      Excl. in GC? --     Constitutional: Alert and oriented. Well appearing and in no apparent distress. HEENT:      Head: Normocephalic and atraumatic.         Eyes: Conjunctivae are normal. Sclera is non-icteric.       Mouth/Throat: Mucous membranes are moist.       Neck: Supple with no signs of meningismus. Cardiovascular: Tachycardic with regular rhythm. No murmurs, gallops, or rubs. 2+ symmetrical distal pulses are present in all extremities. No JVD. Respiratory: Normal respiratory effort. Lungs are clear to auscultation bilaterally. No wheezes, crackles, or rhonchi.  Gastrointestinal: Soft, non tender, and non distended with positive bowel sounds. No rebound or guarding. Musculoskeletal: Nontender with normal range  of motion in all extremities. No edema, cyanosis, or erythema of extremities. Neurologic: Normal speech and language. Face is symmetric. Moving all extremities. No gross focal neurologic deficits are appreciated. Skin: Skin is warm, dry and intact. No rash noted. Psychiatric: Mood and  affect are normal. Speech and behavior are normal.  ____________________________________________   LABS (all labs ordered are listed, but only abnormal results are displayed)  Labs Reviewed  BASIC METABOLIC PANEL - Abnormal; Notable for the following components:      Result Value   Glucose, Bld 118 (*)    All other components within normal limits  CBC - Abnormal; Notable for the following components:   Hemoglobin 11.6 (*)    MCH 25.3 (*)    RDW 17.7 (*)    All other components within normal limits  SARS CORONAVIRUS 2 (TAT 6-24 HRS)  FIBRIN DERIVATIVES D-DIMER ([facility] ONLY)  PROCALCITONIN  POC URINE PREG, ED  TROPONIN I (HIGH SENSITIVITY)  TROPONIN I (HIGH SENSITIVITY)  TROPONIN I (HIGH SENSITIVITY)   ____________________________________________  EKG  ED ECG REPORT I, Nita Sickle, the attending physician, personally viewed and interpreted this ECG.  Sinus tachycardia, rate of 106, normal intervals, normal axis, no ST elevations or depressions.  Unchanged from prior from July 2020 ____________________________________________  RADIOLOGY  I have personally reviewed the images performed during this visit and I agree with the Radiologist's read.   Interpretation by Radiologist:  DG Chest 2 View  Result Date: 11/10/2019 CLINICAL DATA:  Shortness of breath. EXAM: CHEST - 2 VIEW COMPARISON:  None. FINDINGS: The lung volumes are low. There appears to be some crowding of the bronchovascular bundles which is likely secondary to the low lung volumes. There is a questionable opacity projecting over the medial right clavicle, only visualized on the frontal view. The heart size is unremarkable. There  is no pneumothorax. No significant pleural effusion. IMPRESSION: 1. Low lung volumes. 2. Questionable opacity projecting over the medial right clavicle. This could represent a developing infiltrate in the appropriate clinical setting. Electronically Signed   By: Katherine Mantle M.D.   On: 11/10/2019 21:07     ____________________________________________   PROCEDURES  Procedure(s) performed: None Procedures Critical Care performed:  None ____________________________________________   INITIAL IMPRESSION / ASSESSMENT AND PLAN / ED COURSE   33 y.o. female with a history of obesity, asthma, CHF with preserved EF, hypertension, PE on Xarelto, B12 deficiency anemia, fibromyalgia who presents for evaluation of Covid-like symptoms x 7 days.  Patient is well-appearing in no distress, she is slightly tachycardic with a heart rate of 119 and a low-grade temp of 99.44F, normal blood pressure.  Normal work of breathing, normal sats at rest and with ambulation , lungs are clear to auscultation with good air movement, no wheezing or crackles.  Differential diagnosis including Covid versus viral illness versus pneumonia versus PE although less likely with patient being on Xarelto.  Labs showing no leukocytosis, stable mild anemia, no electrolyte derangements.  EKG showing sinus tachycardia which seems to be present in all other EKGs, no evidence of ischemia or dysrhythmias.  Troponin x2 - with no evidence of myocarditis.  D-dimer is pending.  Chest x-ray with questionable developing infiltrate.  Repeat Covid swab was sent.  At this time I do believe patient's symptoms are due to Covid and not bacterial pneumonia therefore we will get a procalcitonin level if that is elevated will start patient on antibiotics.  No signs of an asthma attack with normal work of breathing, no wheezing, and good air movement.  We will treat symptoms with IV fluids, Zofran, Tylenol.    _________________________ 5:00 AM on  11/11/2019 -----------------------------------------  D-dimer is negative.  Procalcitonin is negative.  Patient feels improved, tolerating p.o. HR 86, pOx 98% on RA.  Will send home on supportive care.  Discussed my standard return precautions for Covid care, quarantine, and return to work.       As part of my medical decision making, I reviewed the following data within the [place] notes reviewed and incorporated, Labs reviewed , EKG interpreted , Old chart reviewed, Radiograph reviewed , Notes from prior ED visits and [place] Controlled Substance Database   Please note:  Patient was evaluated in Emergency Department today for the symptoms described in the history of present illness. Patient was evaluated in the context of the global COVID-19 pandemic, which necessitated consideration that the patient might be at risk for infection with the SARS-CoV-2 virus that causes COVID-19. Institutional protocols and algorithms that pertain to the evaluation of patients at risk for COVID-19 are in a state of rapid change based on information released by regulatory bodies including the CDC and federal and state organizations. These policies and algorithms were followed during the patient's care in the ED.  Some ED evaluations and interventions may be delayed as a result of limited staffing during the pandemic.   ____________________________________________   FINAL CLINICAL IMPRESSION(S) / ED DIAGNOSES   Final diagnoses:  Suspected COVID-19 virus infection      NEW MEDICATIONS STARTED DURING THIS VISIT:  ED Discharge Orders         Ordered    ondansetron (ZOFRAN ODT) 4 MG disintegrating tablet  Every 8 hours PRN     11/11/19 0456           Note:  This document was prepared using Dragon voice recognition software and may include unintentional dictation errors.    Alfred Levins, [place], MD 11/11/19 [place], [place], MD 11/11/19 0500

## 2019-11-11 NOTE — Discharge Instructions (Signed)
QUARANTINE INSTRUCTION ° °Follow these instructions at home: ° °Protecting others °To avoid spreading the illness to other people: °Quarantine in your home until you have had no cough and fever for 7 days. Household members should also be quarantine for at least 14 days after being exposed to you. °Wash your hands often with soap and water. If soap and water are not available, use an alcohol-based hand sanitizer. If you have not cleaned your hands, do not touch your face. °Make sure that all people in your household wash their hands well and often. °Cover your nose and mouth when you cough or sneeze. °Throw away used tissues. °Stay home if you have any cold-like or flu-like symptoms. °General instructions °Go to your local pharmacy and buy a pulse oximeter (this is a machine that measures your oxygen). Check your oxygen levels at least 3 times a day. If your oxygen level is 92% or less return to the emergency room immediately °Take over-the-counter and prescription medicines only as told by your health care provider. °If you need medication for fever take tylenol or ibuprofen °Drink enough fluid to keep your urine pale yellow. °Rest at home as directed by your health care provider. °Do not give aspirin to a child with the flu, because of the association with Reye's syndrome. °Do not use tobacco products, including cigarettes, chewing tobacco, and e-cigarettes. If you need help quitting, ask your health care provider. °Keep all follow-up visits as told by your health care provider. This is important. °How is this prevented? °Avoid areas where an outbreak has been reported. °Avoid large groups of people. °Keep a safe distance from people who are coughing and sneezing. °Do not touch your face if you have not cleaned your hands. °When you are around people who are sick or might be sick, wear a mask to protect yourself. °Contact a health care provider if: °You have symptoms of SARS (cough, fever, chest pain, shortness of  breath) that are not getting better at home. °You have a fever. °If you have difficulty breathing go to your local ER or call 911  ° °

## 2019-11-18 ENCOUNTER — Ambulatory Visit: Payer: Medicare HMO | Admitting: Pulmonary Disease

## 2019-11-18 ENCOUNTER — Other Ambulatory Visit: Payer: Self-pay

## 2019-11-18 ENCOUNTER — Encounter: Payer: Self-pay | Admitting: Family

## 2019-11-18 ENCOUNTER — Ambulatory Visit: Payer: Medicare HMO | Attending: Family | Admitting: Family

## 2019-11-18 VITALS — BP 151/109 | HR 98 | Resp 20 | Ht 70.0 in | Wt 363.0 lb

## 2019-11-18 DIAGNOSIS — Z7901 Long term (current) use of anticoagulants: Secondary | ICD-10-CM | POA: Insufficient documentation

## 2019-11-18 DIAGNOSIS — I10 Essential (primary) hypertension: Secondary | ICD-10-CM

## 2019-11-18 DIAGNOSIS — Z791 Long term (current) use of non-steroidal anti-inflammatories (NSAID): Secondary | ICD-10-CM | POA: Diagnosis not present

## 2019-11-18 DIAGNOSIS — G4733 Obstructive sleep apnea (adult) (pediatric): Secondary | ICD-10-CM

## 2019-11-18 DIAGNOSIS — F419 Anxiety disorder, unspecified: Secondary | ICD-10-CM | POA: Insufficient documentation

## 2019-11-18 DIAGNOSIS — Z8249 Family history of ischemic heart disease and other diseases of the circulatory system: Secondary | ICD-10-CM | POA: Insufficient documentation

## 2019-11-18 DIAGNOSIS — Z7951 Long term (current) use of inhaled steroids: Secondary | ICD-10-CM | POA: Insufficient documentation

## 2019-11-18 DIAGNOSIS — I890 Lymphedema, not elsewhere classified: Secondary | ICD-10-CM | POA: Diagnosis not present

## 2019-11-18 DIAGNOSIS — H538 Other visual disturbances: Secondary | ICD-10-CM | POA: Diagnosis not present

## 2019-11-18 DIAGNOSIS — Z833 Family history of diabetes mellitus: Secondary | ICD-10-CM | POA: Insufficient documentation

## 2019-11-18 DIAGNOSIS — Z888 Allergy status to other drugs, medicaments and biological substances status: Secondary | ICD-10-CM | POA: Insufficient documentation

## 2019-11-18 DIAGNOSIS — M797 Fibromyalgia: Secondary | ICD-10-CM | POA: Insufficient documentation

## 2019-11-18 DIAGNOSIS — I110 Hypertensive heart disease with heart failure: Secondary | ICD-10-CM | POA: Insufficient documentation

## 2019-11-18 DIAGNOSIS — J45909 Unspecified asthma, uncomplicated: Secondary | ICD-10-CM | POA: Diagnosis not present

## 2019-11-18 DIAGNOSIS — Z86711 Personal history of pulmonary embolism: Secondary | ICD-10-CM | POA: Diagnosis not present

## 2019-11-18 DIAGNOSIS — Z79899 Other long term (current) drug therapy: Secondary | ICD-10-CM | POA: Insufficient documentation

## 2019-11-18 DIAGNOSIS — R7303 Prediabetes: Secondary | ICD-10-CM | POA: Insufficient documentation

## 2019-11-18 DIAGNOSIS — F319 Bipolar disorder, unspecified: Secondary | ICD-10-CM | POA: Diagnosis not present

## 2019-11-18 DIAGNOSIS — I5033 Acute on chronic diastolic (congestive) heart failure: Secondary | ICD-10-CM | POA: Diagnosis present

## 2019-11-18 MED ORDER — METOLAZONE 2.5 MG PO TABS: 2.5000 mg | ORAL_TABLET | Freq: Every day | ORAL | 3 refills | Status: DC

## 2019-11-18 MED ORDER — POTASSIUM CHLORIDE CRYS ER 20 MEQ PO TBCR: 20.0000 meq | EXTENDED_RELEASE_TABLET | Freq: Every day | ORAL | 0 refills | Status: DC

## 2019-11-18 NOTE — Patient Instructions (Addendum)
Begin weighing daily and call for an overnight weight gain of > 2 pounds or a weekly weight gain of >5 pounds.  Take metolazone (booster fluid pill) daily for the next 3 days. Best to take it 1/2 hour prior to lasix if possible. During these 3 days, double your potassium.

## 2019-11-18 NOTE — Progress Notes (Signed)
Subjective:    Patient ID: Andrea Miller, female    DOB: [birth year], 33 y.o.   MRN: [id number]  HPI  Andrea Miller is a 33 y/o female with a history of asthma, HTN, SVT, obesity, bipolar, anxiety, depression, fibromyalgia, seizures, bulging disc and chronic heart failure.   Echo report from 09/16/18 reviewed and showed an EF of 55%. Echo report from 07/04/18 reviewed and showed an EF of 73%. Echo report from 04/15/17 reviewed and showed an EF of 60-65%.  In the last 6 months, patient has been in the ED 6 times with the most recent being 11/11/2019. She had shortness of breath and chest pain. COVID test negative and she was released.   She presents today for a follow-up visit although hasn't been seen since September 2019. She presents with a chief complaint of moderate shortness of breath upon minimal exertion. She describes this as chronic in nature having been present for several years although she reports that it's been worsening over the last couple of weeks. She has associated fatigue, visual disturbance, chest tightness, intermittent chest pain, pedal edema, headaches, light-headedness, depression (with COVID pandemic) and gradual weight gain along with this. She denies any abdominal distention, palpitations or cough.   She says that she fell and hit her head September 2020 and since then she's been having blurry vision and where the right peripheral vision just "blacks out". She is being followed by opthalmology and is currently using steroid eye drops. Is supposed to be having an MRI done in the future.   Past Medical History:  Diagnosis Date  . Anxiety   . Asthma   . Bipolar disorder (HCC)   . Chest pain    and angina  . CHF (congestive heart failure) (HCC)   . Depression   . Fibromyalgia   . Hypertension   . 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 Obesity   . Pre-diabetes   . Pulmonary embolus (HCC)   . Seizures (HCC)    secondary to anxiety   last seizure 01/25/18  . SVT (supraventricular tachycardia) (HCC)    with hx of syncope; [facility]   Past Surgical History:  Procedure Laterality Date  . CHOLECYSTECTOMY N/A 02/22/2018   Procedure: LAPAROSCOPIC CHOLECYSTECTOMY;  Surgeon: Ancil Linseyavis, Jason Evan, MD;  Location: [facility] ORS;  Service: General;  Laterality: N/A;  . TONSILLECTOMY AND ADENOIDECTOMY N/A 09/05/2016   Procedure: TONSILLECTOMY AND ADENOIDECTOMY;  Surgeon: Linus Salmonshapman McQueen, MD;  Location: [facility] ORS;  Service: ENT;  Laterality: N/A;   Family History  Problem Relation Age of Onset  . Diabetes Mother   . Hypertension Mother   . Asthma Mother   . Gallstones Mother   . Diabetes Father   . Hypertension Father   . Gallstones Father   . Bipolar disorder Sister   . COPD Brother   . Schizophrenia Brother   . COPD Brother   . Hypertension Brother    Social History   Tobacco Use  . Smoking status: Never Smoker  . Smokeless tobacco: Never Used  Substance Use Topics  . Alcohol use: No   Allergies  Allergen Reactions  . Cortizone-10 [Hydrocortisone] Shortness Of Breath and Swelling  . Garlic Anaphylaxis  . Prednisone Swelling    Tongue swelling  . Corticosteroids Palpitations   Prior to Admission medications   Medication Sig Start Date End Date Taking? Authorizing Provider  Adapalene 0.3 % gel Apply topically at bedtime.  10/20/19  Yes  [provider]  albuterol (PROVENTIL) (2.5 MG/3ML) 0.083% nebulizer solution Take 3 mLs (2.5 mg total) by nebulization every 4 (four) hours as needed for wheezing or shortness of breath. 09/23/19 11/18/19 Yes Tyler Pita, MD  Albuterol Sulfate 108 (90 Base) MCG/ACT AEPB Inhale 2 puffs into the lungs every 6 (six) hours as needed. 09/23/19 11/18/19 Yes Tyler Pita, MD  budesonide-formoterol [facility] 160-4.5 MCG/ACT inhaler Inhale 2 puffs into the lungs 2 (two) times daily. 09/23/19 11/18/19 Yes Tyler Pita, MD  busPIRone (BUSPAR) 10 MG tablet Take  10 mg by mouth 2 (two) times daily.  10/22/19  Yes [provider]  Calcium-Magnesium 500-250 MG TABS Take 1 tablet by mouth 2 (two) times daily.    Yes [provider]  clonazePAM (KLONOPIN) 1 MG tablet Take 1 tablet by mouth 2 (two) times a day. 04/02/19  Yes [provider]  cyanocobalamin (,VITAMIN B-12,) 1000 MCG/ML injection Inject 1,000 mcg into the muscle every 30 (thirty) days.   Yes [provider]  Dexlansoprazole (DEXILANT) 30 MG capsule Take 30 mg by mouth daily.   Yes [provider]  diclofenac sodium (VOLTAREN) 1 % GEL Apply 4 g topically daily as needed (for pain).   Yes [provider]  diltiazem (CARDIZEM CD) 360 MG 24 hr capsule Take 360 mg by mouth every morning. 12/18/18  Yes [provider]  escitalopram (LEXAPRO) 20 MG tablet Take 20 mg by mouth daily.   Yes [provider]  fluticasone (FLONASE) 50 MCG/ACT nasal spray Place 2 sprays into both nostrils daily as needed for allergies.    Yes [provider]  folic acid (FOLVITE) 1 MG tablet Take 1 mg by mouth daily.  12/15/16  Yes [provider]  furosemide (LASIX) 20 MG tablet Take 40 mg by mouth 2 (two) times daily.    Yes [provider]  gabapentin (NEURONTIN) 400 MG capsule Take 400 mg by mouth 3 (three) times daily.   Yes [provider]  hydroxypropyl methylcellulose / hypromellose (ISOPTO TEARS / GONIOVISC) 2.5 % ophthalmic solution Place 1 drop into both eyes 3 (three) times daily as needed for dry eyes.   Yes [provider]  Iron-Vitamin C 65-125 MG TABS Take 1 tablet by mouth 2 (two) times daily. 09/19/19  Yes Corcoran, Drue Second, MD  ketoconazole (NIZORAL) 2 % shampoo Apply 1 application topically every 30 (thirty) days.  10/01/18  Yes [provider]  lactulose (CHRONULAC) 10 GM/15ML solution Take 30 g by mouth daily as needed for mild constipation.  08/28/18  Yes [provider]   lamoTRIgine (LAMICTAL) 200 MG tablet Take 1 tablet by mouth 2 (two) times a day. 05/20/19  Yes [provider]  linaclotide (LINZESS) 290 MCG CAPS capsule Take 290 mcg by mouth daily before breakfast.   Yes [provider]  montelukast (SINGULAIR) 10 MG tablet Take 1 tablet (10 mg total) by mouth at bedtime. 09/23/19 12/22/19 Yes Tyler Pita, MD  ondansetron (ZOFRAN ODT) 4 MG disintegrating tablet Take 1 tablet (4 mg total) by mouth every 8 (eight) hours as needed. 11/11/19  Yes Alfred Levins, [place], MD  Potassium Chloride ER 20 MEQ TBCR Take 20 mEq by mouth 2 (two) times daily.    Yes [provider]  prednisoLONE acetate (PRED FORTE) 1 % ophthalmic suspension Place 1 drop into the right eye 4 (four) times daily.  10/15/19  Yes [provider]  propranolol (INDERAL) 40 MG tablet Take 2 tablets (  80 mg total) by mouth 3 (three) times daily. 10/13/18  Yes Salary, Montell D, MD  risperiDONE (RISPERDAL) 3 MG tablet Take 3 mg by mouth at bedtime.    Yes [provider]  rivaroxaban (XARELTO) 20 MG TABS tablet Take 20 mg by mouth every morning.   Yes [provider]  rosuvastatin (CRESTOR) 20 MG tablet Take 20 mg by mouth daily.   Yes [provider]  tiZANidine (ZANAFLEX) 4 MG tablet Take 4 mg by mouth 3 (three) times daily.  03/26/17  Yes [provider]  Topiramate ER (TROKENDI XR) 200 MG CP24 Take 1 capsule by mouth daily.    Yes [provider]  vitamin C (VITAMIN C) 250 MG tablet Take 1 tablet (250 mg total) by mouth 2 (two) times daily. 10/13/18  Yes Salary, Evelena Asa, MD     Review of Systems  Constitutional: Positive for fatigue. Negative for appetite change.  HENT: Negative for congestion, postnasal drip and sore throat.   Eyes: Positive for visual disturbance (blurry vision right eye).  Respiratory: Positive for chest tightness and shortness of breath. Negative for cough.   Cardiovascular: Positive for  chest pain (intermittent) and leg swelling (lower legs/ feet). Negative for palpitations.  Gastrointestinal: Negative for abdominal distention and abdominal pain.  Endocrine: Negative.   Genitourinary: Negative.   Musculoskeletal: Positive for arthralgias (leg pain) and back pain (due to bulging disc).  Skin: Negative.   Allergic/Immunologic: Negative.   Neurological: Positive for light-headedness and headaches. Negative for dizziness.  Hematological: Negative for adenopathy. Does not bruise/bleed easily.  Psychiatric/Behavioral: Positive for dysphoric mood. Negative for sleep disturbance (wearing CPAP at night). The patient is not nervous/anxious.    Vitals:   11/18/19 0938  BP: (!) 151/109  Pulse: 98  Resp: 20  SpO2: 98%  Weight: (!) 363 lb (164.7 kg)  Height: 5\' 10"  (1.778 m)   Wt Readings from Last 3 Encounters:  11/18/19 (!) 363 lb (164.7 kg)  11/10/19 (!) 340 lb (154.2 kg)  09/23/19 (!) 357 lb 12.8 oz (162.3 kg)   Lab Results  Component Value Date   CREATININE 0.84 11/10/2019   CREATININE 0.66 09/22/2019   CREATININE 0.73 06/07/2019    Objective:   Physical Exam Vitals and nursing note reviewed.  Constitutional:      Appearance: She is well-developed.  HENT:     Head: Normocephalic and atraumatic.  Neck:     Vascular: No JVD.  Cardiovascular:     Rate and Rhythm: Normal rate and regular rhythm.  Pulmonary:     Effort: Pulmonary effort is normal. No respiratory distress.     Breath sounds: No wheezing or rales.  Abdominal:     General: There is no distension.     Palpations: Abdomen is soft.  Musculoskeletal:     Cervical back: Normal range of motion and neck supple.     Right lower leg: No tenderness. Edema (1+ pitting) present.     Left lower leg: No tenderness. Edema (1+ pitting) present.  Skin:    General: Skin is warm and dry.  Neurological:     Mental Status: She is alert and oriented to person, place, and time.  Psychiatric:        Behavior:  Behavior normal.       Assessment & Plan:   1: Acute on Chronic heart failure with preserved ejection fraction- - NYHA class III - minimally fluid overloaded today with edema, weight gain and worsening shortness of breath -  not weighing daily as she's waiting on her scale from [facility] to arrive; reminded to call for an overnight weight gain of >2 pounds or a weekly weight gain of >5 pounds - weight up 23 pounds from ED visit last week although I question the accuracy - will add metolazone 2.5mg  daily for the next 3 days and additional potassium daily for the next 3 days as well  - will check BMP next week - not adding salt and occasionally reads food labels; reviewed the importance of closely following a 2000mg  sodium diet  - says that she's eating "a lot" of ice - saw cardiology Welton Flakes) ~ Nov 2020 - drinking ~ 64 ounces of fluid daily - BNP 06/29/18 was 49.0 - says that she's received her flu vaccine for this season  2: HTN- - BP elevated today; giving 3 doses of metolazone per above - follows with PCP Letta Pate) at [facility]  - BMP 11/10/2019 reviewed and showed sodium 139, potassium 4.0, creatinine 0.84 and GFR >60  3: Obstructive sleep apnea- - wearing CPAP nightly and feels like she's sleeping well - saw pulmonologist Jayme Cloud) 09/23/2019  4: Lymphedema- - stage 2 - elevating legs some during the day - limited in her ability to exercise due to disc in her back causing pain - wearing lymphapress compression boots which helps with edema   Patient did not bring her medications nor a list and does not know what she's taking. Says that her medications are bubble packed and she was instructed to bring them with her next week when she returns.

## 2019-11-25 NOTE — Progress Notes (Signed)
[facility]  [address] [place], [place] [address] Phone: [phone]  Fax: [phone]   Telemedicine Office Visit:  11/27/2019  Referring physician: Emogene Morgan, MD  I connected with Andrea Miller on 11/27/2019 at 3:35 PM by videoconferencing and verified that I was speaking with the correct person using 2 identifiers.  The patient was at a nail salon.  I discussed the limitations, risk, security and privacy concerns of performing an evaluation and management service by videoconferencing and the availability of in person appointments.  I also discussed with the patient that there may be a patient responsible charge related to this service.  The patient expressed understanding and agreed to proceed.   Chief Complaint: Andrea Miller is a 33 y.o. female with pulmonary embolism, iron deficiency anemia secondary to menorrhagia,andB12 deficiency who is seen for 4 week assessment.   HPI: The patient was last seen in the hematology clinic on 10/30/2019 via telemedicine. At that time, she noted interval shortness of breath when she stopped taking Xarelto during her menses. Symptoms had resolved. She noted intermittent right visual changes. Hematocrit 38.0, hemoglobin 11.5, MCV 80.9, platelets 271,000, WBC 5,300. She remained on Xarelto.   She had a follow up with Clarisa Kindred, FNP on 11/18/2019. She noted moderate shortness of breath upon minimal exertion. Symptoms had worsened over the last couple of weeks. She felt fatigued. She had visual disturbance, chest tightness, intermittent chest pain, pedal edema, headaches, light-headedness, depression (secondary pandemic) and gradual weight gain. She had blurry vision and right peripheral vision ("black outs") after falling and hitting her head in 07/2019. Clarisa Kindred, FNP added metolazone 2.5 mg daily for 3 days and 20 meq potassium daily for 3 days.   Labs on 11/26/2019: Hematocrit 41.1, hemoglobin 12.4,  MCV 84.7, platelets 300,000, WBC 4,800. Ferritin was 31 with an iron saturation of 14% and TIBC of 401. Potassium 3.4. AST 13. Folate 20.1.   During the interim, she has felt "ok".  She notes whole body pain (8/10). She reports nausea and vomiting with palpitations and increased shortness of breath. She is on a "fluid booster" prescribed by the heart failure clinic. Her last echo on 09/16/2018 showed an EF of 50-55%. She notes a history of a sharp pain in her chest. She denies any pleuritic pain; the pain increases when pressure is applied.  Overall, she feels "better today".  Vision in her right eye is blurry. Vision is better in the left eye.  She has occasional eye redness. She denies any bleeding. She notes that her gums bleed on the right side since falling in 07/2019. She continues to have headaches.   During her menstrual cycle, she wears an adult diaper. She would like to have her blood counts checked immediately after her next menstrual cycle.   She was seen by Clarisa Kindred, FNP today. Her weight was down 8 pounds secondary to fluid loss. Exam was stable. She is scheduled for a head MRI.  She is going to see an ophthalmologist.   Past Medical History:  Diagnosis Date  . Anxiety   . Asthma   . Bipolar disorder (HCC)   . Chest pain    and angina  . CHF (congestive heart failure) (HCC)   . Depression   . Fibromyalgia   . Hypertension   . 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 Obesity   . Pre-diabetes   . Pulmonary embolus (HCC)   .  Seizures (HCC)    secondary to anxiety  last seizure 01/25/18  . SVT (supraventricular tachycardia) (HCC)    with hx of syncope; [facility]    Past Surgical History:  Procedure Laterality Date  . CHOLECYSTECTOMY N/A 02/22/2018   Procedure: LAPAROSCOPIC CHOLECYSTECTOMY;  Surgeon: Ancil Linseyavis, Jason Evan, MD;  Location: [facility] ORS;  Service: General;  Laterality: N/A;  . TONSILLECTOMY AND ADENOIDECTOMY N/A 09/05/2016    Procedure: TONSILLECTOMY AND ADENOIDECTOMY;  Surgeon: Linus Salmonshapman McQueen, MD;  Location: [facility] ORS;  Service: ENT;  Laterality: N/A;    Family History  Problem Relation Age of Onset  . Diabetes Mother   . Hypertension Mother   . Asthma Mother   . Gallstones Mother   . Diabetes Father   . Hypertension Father   . Gallstones Father   . Bipolar disorder Sister   . COPD Brother   . Schizophrenia Brother   . COPD Brother   . Hypertension Brother     Social History:  reports that she has never smoked. She has never used smokeless tobacco. She reports that she does not drink alcohol or use drugs.  No tobacco, or illegal subatance use. Prior to her back issues, patient was employed full time at [employer]. The patient is alone today.  Participants in the patient's visit and their role in the encounter included the patient Bronwen BettersCourtney Grissett, CMA, today.  The intake visit was provided by Bronwen Bettersourtney Grissett, CMA.   Allergies:  Allergies  Allergen Reactions  . Cortizone-10 [Hydrocortisone] Shortness Of Breath and Swelling  . Garlic Anaphylaxis  . Prednisone Swelling    Tongue swelling  . Corticosteroids Palpitations    Current Medications: Current Outpatient Medications  Medication Sig Dispense Refill  . Adapalene 0.3 % gel Apply topically at bedtime.     Marland Kitchen. albuterol (PROVENTIL) (2.5 MG/3ML) 0.083% nebulizer solution Take 3 mLs (2.5 mg total) by nebulization every 4 (four) hours as needed for wheezing or shortness of breath. 360 mL 0  . Albuterol Sulfate 108 (90 Base) MCG/ACT AEPB Inhale 2 puffs into the lungs every 6 (six) hours as needed. 1 each 5  . budesonide-formoterol (SYMBICORT) 160-4.5 MCG/ACT inhaler Inhale 2 puffs into the lungs 2 (two) times daily. 1 Inhaler 4  . busPIRone (BUSPAR) 10 MG tablet Take 10 mg by mouth 2 (two) times daily.     . Calcium-Magnesium 500-250 MG TABS Take 1 tablet by mouth 2 (two) times daily.     . clonazePAM (KLONOPIN) 1 MG tablet Take 1 tablet by  mouth 2 (two) times a day.    . cyanocobalamin (,VITAMIN B-12,) 1000 MCG/ML injection Inject 1,000 mcg into the muscle every 30 (thirty) days.    Marland Kitchen. Dexlansoprazole (DEXILANT) 30 MG capsule Take 30 mg by mouth daily.    . diclofenac sodium (VOLTAREN) 1 % GEL Apply 4 g topically daily as needed (for pain).    Marland Kitchen. diltiazem (CARDIZEM CD) 360 MG 24 hr capsule Take 360 mg by mouth every morning.    . escitalopram (LEXAPRO) 20 MG tablet Take 20 mg by mouth daily.    . fluticasone (FLONASE) 50 MCG/ACT nasal spray Place 2 sprays into both nostrils daily as needed for allergies.     . folic acid (FOLVITE) 1 MG tablet Take 1 mg by mouth daily.   10  . furosemide (LASIX) 20 MG tablet Take 40 mg by mouth 2 (two) times daily.     Marland Kitchen. gabapentin (NEURONTIN) 400 MG capsule Take 400 mg by mouth  3 (three) times daily.    . hydroxypropyl methylcellulose / hypromellose (ISOPTO TEARS / GONIOVISC) 2.5 % ophthalmic solution Place 1 drop into both eyes 3 (three) times daily as needed for dry eyes.    . Iron-Vitamin C 65-125 MG TABS Take 1 tablet by mouth 2 (two) times daily. 60 tablet 1  . ketoconazole (NIZORAL) 2 % shampoo Apply 1 application topically every 30 (thirty) days.   1  . lactulose (CHRONULAC) 10 GM/15ML solution Take 30 g by mouth daily as needed for mild constipation.   5  . lamoTRIgine (LAMICTAL) 200 MG tablet Take 1 tablet by mouth 2 (two) times a day.    . linaclotide (LINZESS) 290 MCG CAPS capsule Take 290 mcg by mouth daily before breakfast.    . montelukast (SINGULAIR) 10 MG tablet Take 1 tablet (10 mg total) by mouth at bedtime. 90 tablet 1  . ondansetron (ZOFRAN ODT) 4 MG disintegrating tablet Take 1 tablet (4 mg total) by mouth every 8 (eight) hours as needed. 20 tablet 0  . Potassium Chloride ER 20 MEQ TBCR Take 20 mEq by mouth 2 (two) times daily.     . potassium chloride SA (KLOR-CON) 20 MEQ tablet Take 1 tablet (20 mEq total) by mouth daily. While taking metolazone 6 tablet 0  . prednisoLONE  acetate (PRED FORTE) 1 % ophthalmic suspension Place 1 drop into the right eye 4 (four) times daily.     . propranolol (INDERAL) 40 MG tablet Take 2 tablets (80 mg total) by mouth 3 (three) times daily. 90 tablet 0  . risperiDONE (RISPERDAL) 3 MG tablet Take 3 mg by mouth at bedtime.     . rivaroxaban (XARELTO) 20 MG TABS tablet Take 20 mg by mouth every morning.    . rosuvastatin (CRESTOR) 20 MG tablet Take 20 mg by mouth daily.    Marland Kitchen tiZANidine (ZANAFLEX) 4 MG tablet Take 4 mg by mouth 3 (three) times daily.   1  . Topiramate ER (TROKENDI XR) 200 MG CP24 Take 1 capsule by mouth daily.     . vitamin C (VITAMIN C) 250 MG tablet Take 1 tablet (250 mg total) by mouth 2 (two) times daily. 60 tablet 0   No current facility-administered medications for this visit.    Review of Systems  Constitutional: Positive for weight loss (8 pounds secondary to fluid loss). Negative for chills, diaphoresis, fever and malaise/fatigue.       Doing "ok".  HENT: Negative.  Negative for congestion, ear pain, hearing loss, nosebleeds, sinus pain and sore throat.        Right sided gum bleeding s/p fall in 07/2019.  Eyes: Positive for blurred vision (right eye) and redness (s/p fall in 07/2019). Negative for double vision and photophobia.       Vision in right eye is blurry.  Plans to see ophthalmologist.  Respiratory: Positive for shortness of breath. Negative for cough, sputum production and wheezing.   Cardiovascular: Positive for chest pain (sharp pain; increases when pressure is applied) and palpitations. Negative for orthopnea, claudication and leg swelling.  Gastrointestinal: Positive for nausea and vomiting. Negative for abdominal pain, blood in stool, constipation, diarrhea and melena.  Genitourinary: Negative for dysuria, frequency, hematuria and urgency.       Heavy periods.  Musculoskeletal: Negative.  Negative for back pain (disc problems), falls (07/2019), joint pain and myalgias.       Whole body pain  (8/10).  Skin: Negative.  Negative for rash.  Neurological: Positive for  headaches (s/p fall on 07/2019). Negative for dizziness, tingling, sensory change, speech change, focal weakness and weakness.  Endo/Heme/Allergies: Bruises/bleeds easily.  Psychiatric/Behavioral: Negative.  Negative for depression and memory loss. The patient is not nervous/anxious and does not have insomnia.   All other systems reviewed and are negative.  Performance status (ECOG): 1  Physical Exam  Constitutional: She is oriented to person, place, and time. She appears well-developed and well-nourished. No distress.  HENT:  Head: Normocephalic and atraumatic.  Long curly hair.  Eyes: Conjunctivae and EOM are normal. No scleral icterus.  Brown eyes.  Neurological: She is alert and oriented to person, place, and time.  Skin: She is not diaphoretic.  Psychiatric: She has a normal mood and affect. Her behavior is normal. Judgment and thought content normal.  Nursing note reviewed.   Appointment on 11/26/2019  Component Date Value Ref Range Status  . Sodium 11/26/2019 139  135 - 145 mmol/L Final  . Potassium 11/26/2019 3.4* 3.5 - 5.1 mmol/L Final  . Chloride 11/26/2019 103  98 - 111 mmol/L Final  . CO2 11/26/2019 28  22 - 32 mmol/L Final  . Glucose, Bld 11/26/2019 104* 70 - 99 mg/dL Final  . BUN 29/52/8413 11  6 - 20 mg/dL Final  . Creatinine, Ser 11/26/2019 0.59  0.44 - 1.00 mg/dL Final  . Calcium 24/40/1027 9.6  8.9 - 10.3 mg/dL Final  . Total Protein 11/26/2019 8.1  6.5 - 8.1 g/dL Final  . Albumin 25/36/6440 4.3  3.5 - 5.0 g/dL Final  . AST 34/74/2595 13* 15 - 41 U/L Final  . ALT 11/26/2019 9  0 - 44 U/L Final  . Alkaline Phosphatase 11/26/2019 75  38 - 126 U/L Final  . Total Bilirubin 11/26/2019 0.5  0.3 - 1.2 mg/dL Final  . GFR calc non Af Amer 11/26/2019 >60  >60 mL/min Final  . GFR calc Af Amer 11/26/2019 >60  >60 mL/min Final  . Anion gap 11/26/2019 8  5 - 15 Final   Performed at [facility], [address] [place], [place] [address]  . Iron 11/26/2019 55  28 - 170 ug/dL Final  . TIBC 64/33/2951 401  250 - 450 ug/dL Final  . Saturation Ratios 11/26/2019 14  10.4 - 31.8 % Final  . UIBC 11/26/2019 346  ug/dL Final   Performed at [facility], [address], [place], [place] [address]  . Folate 11/26/2019 20.1  >5.9 ng/mL Final   Performed at [facility], [address] [place], [place] [address]  . Ferritin 11/26/2019 31  11 - 307 ng/mL Final   Performed at [facility], [address] [place] [place], [place] [address]  . WBC 11/26/2019 4.8  4.0 - 10.5 K/uL Final  . RBC 11/26/2019 4.85  3.87 - 5.11 MIL/uL Final  . Hemoglobin 11/26/2019 12.4  12.0 - 15.0 g/dL Final  . HCT 93/23/5573 41.1  36.0 - 46.0 % Final  . MCV 11/26/2019 84.7  80.0 - 100.0 fL Final  . MCH 11/26/2019 25.6* 26.0 - 34.0 pg Final  . MCHC 11/26/2019 30.2  30.0 - 36.0 g/dL Final  . RDW 22/12/5425 17.2* 11.5 - 15.5 % Final  . Platelets 11/26/2019 300  150 - 400 K/uL Final  . nRBC 11/26/2019 0.0  0.0 - 0.2 % Final  . Neutrophils Relative % 11/26/2019 43  % Final  . Neutro Abs 11/26/2019 2.0  1.7 - 7.7 K/uL Final  . Lymphocytes Relative 11/26/2019 48  %  Final  . Lymphs Abs 11/26/2019 2.3  0.7 - 4.0 K/uL Final  . Monocytes Relative 11/26/2019 7  % Final  . Monocytes Absolute 11/26/2019 0.3  0.1 - 1.0 K/uL Final  . Eosinophils Relative 11/26/2019 1  % Final  . Eosinophils Absolute 11/26/2019 0.0  0.0 - 0.5 K/uL Final  . Basophils Relative 11/26/2019 1  % Final  . Basophils Absolute 11/26/2019 0.0  0.0 - 0.1 K/uL Final  . Immature Granulocytes 11/26/2019 0  % Final  . Abs Immature Granulocytes 11/26/2019 0.01  0.00 - 0.07 K/uL Final   Performed at [facility], [address], [place], [place] [address]    Assessment:  Andrea Miller is a 33 y.o. female with small bilateral pulmonary emboli. Risk factors for thrombosis include obesity and birth control  pills.  Chest CT angiogramon 09/15/2018 revealed small bilateral pulmonary emboli and mild cardiomegaly. Bilateral lower extremity duplexon 09/16/2018 revealed no evidence of lower extremity DVT.   Hypercoagulable work-upon 10/11/2018 revealed the following negative studies: factor V Leiden, prothrombin gene mutation, anti-cardiolipin antibodies, and beta-2 glycoprotein antibodies. Lupus anticoagulant testing was positive (on Xarelto).Protein C, protein S, and ATIII were normal on 01/15/2019. Lupus anticoagulant testing was negative on 04/24/2019.  She has iron deficiency anemia.She notes a recent history of extremely heavy mensesx 3 weeks (none in past 2 weeks). Hemoglobin was 10.5 on 10/29/2019and 6.5 on 10/10/2018. Work-up on 10/10/2018 revealed a ferritinof 4, iron saturation 3%. She hasice pica.She received 3 units of PRBCs. She is on oral iron.  She received Venofer on 10/07/2019 and 10/20/2019.  Ferritinhas been followed: 4 on 10/11/2018,7 on 01/15/2019, 11 on 04/24/2019, and 10 on 09/18/2019.  She has B12 deficiency. B12 was 242 on 10/10/2018. She began B12 injectionson 10/13/2018 (last11/08/2019). Folate was 20.1 on 11/26/2019.  Diet has been protein shakes and Weight Watcher's.  Symptomatically, she feels better today.  She continues to have heavy menses.  Right eye remains blurry.  Plan: 1.   Review labs from 11/26/2019. 2.   Iron deficiency anemia Hematocrit 41.1.  Hemoglobin 12.4. MCV 84.7. Ferritin 31. She received Venofer on 10/07/2019 and 10/20/2019. Ferritin goal 100.    Plan for additional Venofer if ferritin < 30. 3. B12 deficiency Continue monthly B12 (last 10/07/2019).  Patient late for injection.  RTC for B12 (next available) and monthly x 6. 4. Menorrhagia Patient has heavy menses. Suggest follow-up with [facility] [phone]). Patient would like to check CBC after menses. RTC for labs (CBC,  ferritin) after menses- patient to call in. 5. Pulmonary embolism Patientremains on Xarelto. Etiology of thrombosis feltsecondary to weight and birth control pills Repeat lupus anticoagulant testing was negative. Discuss importance of continuation of Xarelto. 6.     RTC in 3 months for MD assessment, labs (CBC with differential, CMP, ferritin- day before) and +/- Venofer.  I discussed the assessment and treatment plan with the patient.  The patient was provided an opportunity to ask questions and all were answered.  The patient agreed with the plan and demonstrated an understanding of the instructions.  The patient was advised to call back if the symptoms worsen or if the condition fails to improve as anticipated.  I provided 25 minutes (3:35 PM - 4:00 PM) of face-to-face video visit time during this this encounter and > 50% was spent counseling as documented under my assessment and plan.  I provided these services from the [facility] office.   Lequita Asal, MD, PhD    11/27/2019, 3:35  PM  I, Theador HawthorneAlexis Patterson, am acting as scribe for [employer] MotorsMelissa C. Merlene Pullingorcoran, MD, PhD.  I, Meghen Akopyan C. Merlene Pullingorcoran, MD, have reviewed the above documentation for accuracy and completeness, and I agree with the above.

## 2019-11-26 ENCOUNTER — Inpatient Hospital Stay: Payer: Medicare HMO

## 2019-11-26 ENCOUNTER — Other Ambulatory Visit: Payer: Self-pay

## 2019-11-26 DIAGNOSIS — E538 Deficiency of other specified B group vitamins: Secondary | ICD-10-CM

## 2019-11-26 DIAGNOSIS — D500 Iron deficiency anemia secondary to blood loss (chronic): Secondary | ICD-10-CM

## 2019-11-26 DIAGNOSIS — D649 Anemia, unspecified: Secondary | ICD-10-CM

## 2019-11-26 LAB — CBC WITH DIFFERENTIAL/PLATELET
Abs Immature Granulocytes: 0.01 10*3/uL (ref 0.00–0.07)
Basophils Absolute: 0 10*3/uL (ref 0.0–0.1)
Basophils Relative: 1 %
Eosinophils Absolute: 0 10*3/uL (ref 0.0–0.5)
Eosinophils Relative: 1 %
HCT: 41.1 % (ref 36.0–46.0)
Hemoglobin: 12.4 g/dL (ref 12.0–15.0)
Immature Granulocytes: 0 %
Lymphocytes Relative: 48 %
Lymphs Abs: 2.3 10*3/uL (ref 0.7–4.0)
MCH: 25.6 pg — ABNORMAL LOW (ref 26.0–34.0)
MCHC: 30.2 g/dL (ref 30.0–36.0)
MCV: 84.7 fL (ref 80.0–100.0)
Monocytes Absolute: 0.3 10*3/uL (ref 0.1–1.0)
Monocytes Relative: 7 %
Neutro Abs: 2 10*3/uL (ref 1.7–7.7)
Neutrophils Relative %: 43 %
Platelets: 300 10*3/uL (ref 150–400)
RBC: 4.85 MIL/uL (ref 3.87–5.11)
RDW: 17.2 % — ABNORMAL HIGH (ref 11.5–15.5)
WBC: 4.8 10*3/uL (ref 4.0–10.5)
nRBC: 0 % (ref 0.0–0.2)

## 2019-11-26 LAB — COMPREHENSIVE METABOLIC PANEL
ALT: 9 U/L (ref 0–44)
AST: 13 U/L — ABNORMAL LOW (ref 15–41)
Albumin: 4.3 g/dL (ref 3.5–5.0)
Alkaline Phosphatase: 75 U/L (ref 38–126)
Anion gap: 8 (ref 5–15)
BUN: 11 mg/dL (ref 6–20)
CO2: 28 mmol/L (ref 22–32)
Calcium: 9.6 mg/dL (ref 8.9–10.3)
Chloride: 103 mmol/L (ref 98–111)
Creatinine, Ser: 0.59 mg/dL (ref 0.44–1.00)
GFR calc Af Amer: >60 mL/min (ref >60)
GFR calc non Af Amer: >60 mL/min (ref >60)
Glucose, Bld: 104 mg/dL — ABNORMAL HIGH (ref 70–99)
Potassium: 3.4 mmol/L — ABNORMAL LOW (ref 3.5–5.1)
Sodium: 139 mmol/L (ref 135–145)
Total Bilirubin: 0.5 mg/dL (ref 0.3–1.2)
Total Protein: 8.1 g/dL (ref 6.5–8.1)

## 2019-11-26 LAB — IRON AND TIBC
Iron: 55 ug/dL (ref 28–170)
Saturation Ratios: 14 % (ref 10.4–31.8)
TIBC: 401 ug/dL (ref 250–450)
UIBC: 346 ug/dL

## 2019-11-26 LAB — FOLATE: Folate: 20.1 ng/mL (ref >5.9)

## 2019-11-26 LAB — FERRITIN: Ferritin: 31 ng/mL (ref 11–307)

## 2019-11-26 NOTE — Progress Notes (Signed)
Subjective:    Patient ID: Andrea Miller, female    DOB: [birth year], 33 y.o.   MRN: [id number]  HPI  Ms Dan HumphreysWalker is a 33 y/o female with a history of asthma, HTN, SVT, obesity, bipolar, anxiety, depression, fibromyalgia, seizures, bulging disc and chronic heart failure.   Echo report from 09/16/18 reviewed and showed an EF of 55%. Echo report from 07/04/18 reviewed and showed an EF of 73%. Echo report from 04/15/17 reviewed and showed an EF of 60-65%.  In the last 6 months, patient has been in the ED 6 times with the most recent being 11/11/2019. She had shortness of breath and chest pain. COVID test negative and she was released.   She presents today for a follow-up visit with a chief complaint of minimal shortness of breath upon moderate exertion. She describes this as chronic in nature having been present for several years. She does feel like it's better since she took 3 days of metolazone. She has associated fatigue, chest tightness, intermittent chest pain, pedal edema (improving) and light-headedness along with this. She denies any difficulty sleeping, abdominal distention, palpitations, cough or weight gain.   Past Medical History:  Diagnosis Date  . Anxiety   . Asthma   . Bipolar disorder (HCC)   . Chest pain    and angina  . CHF (congestive heart failure) (HCC)   . Depression   . Fibromyalgia   . Hypertension   . 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 Obesity   . Pre-diabetes   . Pulmonary embolus (HCC)   . Seizures (HCC)    secondary to anxiety  last seizure 01/25/18  . SVT (supraventricular tachycardia) (HCC)    with hx of syncope; [facility]   Past Surgical History:  Procedure Laterality Date  . CHOLECYSTECTOMY N/A 02/22/2018   Procedure: LAPAROSCOPIC CHOLECYSTECTOMY;  Surgeon: Ancil Linseyavis, Jason Evan, MD;  Location: [facility] ORS;  Service: General;  Laterality: N/A;  . TONSILLECTOMY AND ADENOIDECTOMY N/A 09/05/2016   Procedure:  TONSILLECTOMY AND ADENOIDECTOMY;  Surgeon: Linus Salmonshapman McQueen, MD;  Location: [facility] ORS;  Service: ENT;  Laterality: N/A;   Family History  Problem Relation Age of Onset  . Diabetes Mother   . Hypertension Mother   . Asthma Mother   . Gallstones Mother   . Diabetes Father   . Hypertension Father   . Gallstones Father   . Bipolar disorder Sister   . COPD Brother   . Schizophrenia Brother   . COPD Brother   . Hypertension Brother    Social History   Tobacco Use  . Smoking status: Never Smoker  . Smokeless tobacco: Never Used  Substance Use Topics  . Alcohol use: No   Allergies  Allergen Reactions  . Cortizone-10 [Hydrocortisone] Shortness Of Breath and Swelling  . Garlic Anaphylaxis  . Prednisone Swelling    Tongue swelling  . Corticosteroids Palpitations   Prior to Admission medications   Medication Sig Start Date End Date Taking? Authorizing Provider  Adapalene 0.3 % gel Apply topically at bedtime.  10/20/19  Yes [provider]  albuterol (PROVENTIL) (2.5 MG/3ML) 0.083% nebulizer solution Take 3 mLs (2.5 mg total) by nebulization every 4 (four) hours as needed for wheezing or shortness of breath. 09/23/19 11/27/19 Yes Salena SanerGonzalez, Carmen L, MD  Albuterol Sulfate 108 (90 Base) MCG/ACT AEPB Inhale 2 puffs into the lungs every 6 (six) hours as needed. 09/23/19 11/27/19 Yes Salena SanerGonzalez, Carmen L, MD  budesonide-formoterol [facility](SYMBICORT)  160-4.5 MCG/ACT inhaler Inhale 2 puffs into the lungs 2 (two) times daily. 09/23/19 11/27/19 Yes Tyler Pita, MD  busPIRone (BUSPAR) 10 MG tablet Take 10 mg by mouth 2 (two) times daily.  10/22/19  Yes [provider]  Calcium-Magnesium 500-250 MG TABS Take 1 tablet by mouth 2 (two) times daily.    Yes [provider]  clonazePAM (KLONOPIN) 1 MG tablet Take 1 tablet by mouth 2 (two) times a day. 04/02/19  Yes [provider]  cyanocobalamin (,VITAMIN B-12,) 1000 MCG/ML injection Inject 1,000 mcg into the muscle every  30 (thirty) days.   Yes [provider]  Dexlansoprazole (DEXILANT) 30 MG capsule Take 30 mg by mouth daily.   Yes [provider]  diclofenac sodium (VOLTAREN) 1 % GEL Apply 4 g topically daily as needed (for pain).   Yes [provider]  diltiazem (CARDIZEM CD) 360 MG 24 hr capsule Take 360 mg by mouth every morning. 12/18/18  Yes [provider]  escitalopram (LEXAPRO) 20 MG tablet Take 20 mg by mouth daily.   Yes [provider]  fluticasone (FLONASE) 50 MCG/ACT nasal spray Place 2 sprays into both nostrils daily as needed for allergies.    Yes [provider]  folic acid (FOLVITE) 1 MG tablet Take 1 mg by mouth daily.  12/15/16  Yes [provider]  furosemide (LASIX) 20 MG tablet Take 40 mg by mouth 2 (two) times daily.    Yes [provider]  gabapentin (NEURONTIN) 400 MG capsule Take 400 mg by mouth 3 (three) times daily.   Yes [provider]  hydroxypropyl methylcellulose / hypromellose (ISOPTO TEARS / GONIOVISC) 2.5 % ophthalmic solution Place 1 drop into both eyes 3 (three) times daily as needed for dry eyes.   Yes [provider]  Iron-Vitamin C 65-125 MG TABS Take 1 tablet by mouth 2 (two) times daily. 09/19/19  Yes Corcoran, Drue Second, MD  ketoconazole (NIZORAL) 2 % shampoo Apply 1 application topically every 30 (thirty) days.  10/01/18  Yes [provider]  lactulose (CHRONULAC) 10 GM/15ML solution Take 30 g by mouth daily as needed for mild constipation.  08/28/18  Yes [provider]  lamoTRIgine (LAMICTAL) 200 MG tablet Take 1 tablet by mouth 2 (two) times a day. 05/20/19  Yes [provider]  linaclotide (LINZESS) 290 MCG CAPS capsule Take 290 mcg by mouth daily before breakfast.   Yes [provider]  montelukast (SINGULAIR) 10 MG tablet Take 1 tablet (10 mg total) by mouth at bedtime. 09/23/19 12/22/19 Yes Tyler Pita, MD  ondansetron (ZOFRAN ODT) 4 MG  disintegrating tablet Take 1 tablet (4 mg total) by mouth every 8 (eight) hours as needed. 11/11/19  Yes Alfred Levins, [place], MD  Potassium Chloride ER 20 MEQ TBCR Take 20 mEq by mouth 2 (two) times daily.    Yes [provider]  potassium chloride SA (KLOR-CON) 20 MEQ tablet Take 1 tablet (20 mEq total) by mouth daily. While taking metolazone 11/18/19  Yes Rahmir Beever, Otila Kluver A, FNP  prednisoLONE acetate (PRED FORTE) 1 % ophthalmic suspension Place 1 drop into the right eye 4 (four) times daily.  10/15/19  Yes [provider]  propranolol (INDERAL) 40 MG tablet Take 2 tablets (80 mg total) by mouth 3 (three) times daily. 10/13/18  Yes Salary, Montell D, MD  risperiDONE (RISPERDAL) 3 MG tablet Take 3 mg by mouth at bedtime.    Yes [provider]  rivaroxaban (XARELTO) 20 MG  TABS tablet Take 20 mg by mouth every morning.   Yes [provider]  rosuvastatin (CRESTOR) 20 MG tablet Take 20 mg by mouth daily.   Yes [provider]  tiZANidine (ZANAFLEX) 4 MG tablet Take 4 mg by mouth 3 (three) times daily.  03/26/17  Yes [provider]  Topiramate ER (TROKENDI XR) 200 MG CP24 Take 1 capsule by mouth daily.    Yes [provider]  vitamin C (VITAMIN C) 250 MG tablet Take 1 tablet (250 mg total) by mouth 2 (two) times daily. 10/13/18  Yes Salary, Evelena AsaMontell D, MD     Review of Systems  Constitutional: Positive for fatigue. Negative for appetite change.  HENT: Negative for congestion, postnasal drip and sore throat.   Eyes: Positive for visual disturbance (blurry vision right eye).  Respiratory: Positive for chest tightness and shortness of breath ("better"). Negative for cough.   Cardiovascular: Positive for chest pain (intermittent) and leg swelling ("better"). Negative for palpitations.  Gastrointestinal: Negative for abdominal distention and abdominal pain.  Endocrine: Negative.   Genitourinary: Negative.   Musculoskeletal: Positive for  arthralgias (leg pain) and back pain (due to bulging disc).  Skin: Negative.   Allergic/Immunologic: Negative.   Neurological: Positive for light-headedness. Negative for dizziness and headaches.  Hematological: Negative for adenopathy. Does not bruise/bleed easily.  Psychiatric/Behavioral: Positive for dysphoric mood. Negative for sleep disturbance (wearing CPAP at night). The patient is not nervous/anxious.    Vitals:   11/27/19 1032  BP: 139/70  Pulse: 78  Resp: 20  SpO2: 97%  Weight: (!) 357 lb 3.2 oz (162 kg)  Height: 5\' 10"  (1.778 m)   Wt Readings from Last 3 Encounters:  11/27/19 (!) 357 lb 3.2 oz (162 kg)  11/18/19 (!) 363 lb (164.7 kg)  11/10/19 (!) 340 lb (154.2 kg)   Lab Results  Component Value Date   CREATININE 0.59 11/26/2019   CREATININE 0.84 11/10/2019   CREATININE 0.66 09/22/2019    Objective:   Physical Exam Vitals and nursing note reviewed.  Constitutional:      Appearance: She is well-developed.  HENT:     Head: Normocephalic and atraumatic.  Neck:     Vascular: No JVD.  Cardiovascular:     Rate and Rhythm: Normal rate and regular rhythm.  Pulmonary:     Effort: Pulmonary effort is normal. No respiratory distress.     Breath sounds: No wheezing or rales.  Abdominal:     General: There is no distension.     Palpations: Abdomen is soft.  Musculoskeletal:     Cervical back: Normal range of motion and neck supple.     Right lower leg: No tenderness. No edema.     Left lower leg: No tenderness. No edema.  Skin:    General: Skin is warm and dry.  Neurological:     Mental Status: She is alert and oriented to person, place, and time.  Psychiatric:        Behavior: Behavior normal.       Assessment & Plan:   1: Chronic heart failure with preserved ejection fraction- - NYHA class II - euvolemic today - not weighing daily as she's [facility]; reminded to call for an overnight weight gain of >2 pounds or a weekly  weight gain of >5 pounds - weight down 6 pounds from last visit here 9 days ago - took metolazone 2.5mg  daily for 3 days along with additional 20meq potassium daily  for 3 days as well - hematologist got BMP yesterday so will not recheck it today - not adding salt and occasionally reads food labels; reviewed the importance of closely following a 2000mg  sodium diet  - says that she's eating "a lot" of ice - saw cardiology ) ~ Nov 2020 - drinking ~ 64 ounces of fluid daily - BNP 06/29/18 was 49.0 - says that she's received her flu vaccine for this season  2: HTN- - BP looks good today - follows with PCP (Aycock) at 08/29/18  - BMP 11/26/2019 reviewed and showed sodium 139, potassium 3.4, creatinine 0.59 and GFR >60  3: Obstructive sleep apnea- - wearing CPAP nightly and feels like she's sleeping well - saw pulmonologist 11/28/2019) 09/23/2019  4: Lymphedema- - stage 2 - resolved - continues to wear compression boots on ocassion  Patient did not bring her medications nor a list and does not know what she's taking. Says that her medications are bubble packed and she was instructed to bring them with her next week when she returns.   Return in 4 months or sooner for any questions/problems before then.

## 2019-11-27 ENCOUNTER — Ambulatory Visit: Payer: Medicare HMO | Attending: Family | Admitting: Family

## 2019-11-27 ENCOUNTER — Encounter: Payer: Self-pay | Admitting: Hematology and Oncology

## 2019-11-27 ENCOUNTER — Encounter: Payer: Self-pay | Admitting: Family

## 2019-11-27 ENCOUNTER — Inpatient Hospital Stay (HOSPITAL_BASED_OUTPATIENT_CLINIC_OR_DEPARTMENT_OTHER): Payer: Medicare HMO | Admitting: Hematology and Oncology

## 2019-11-27 VITALS — BP 139/70 | HR 78 | Resp 20 | Ht 70.0 in | Wt 357.2 lb

## 2019-11-27 DIAGNOSIS — G4733 Obstructive sleep apnea (adult) (pediatric): Secondary | ICD-10-CM

## 2019-11-27 DIAGNOSIS — Z833 Family history of diabetes mellitus: Secondary | ICD-10-CM | POA: Diagnosis not present

## 2019-11-27 DIAGNOSIS — E669 Obesity, unspecified: Secondary | ICD-10-CM | POA: Insufficient documentation

## 2019-11-27 DIAGNOSIS — R569 Unspecified convulsions: Secondary | ICD-10-CM | POA: Diagnosis not present

## 2019-11-27 DIAGNOSIS — I110 Hypertensive heart disease with heart failure: Secondary | ICD-10-CM | POA: Diagnosis not present

## 2019-11-27 DIAGNOSIS — Z7901 Long term (current) use of anticoagulants: Secondary | ICD-10-CM

## 2019-11-27 DIAGNOSIS — Z7951 Long term (current) use of inhaled steroids: Secondary | ICD-10-CM | POA: Insufficient documentation

## 2019-11-27 DIAGNOSIS — E538 Deficiency of other specified B group vitamins: Secondary | ICD-10-CM

## 2019-11-27 DIAGNOSIS — R7303 Prediabetes: Secondary | ICD-10-CM | POA: Diagnosis not present

## 2019-11-27 DIAGNOSIS — M797 Fibromyalgia: Secondary | ICD-10-CM | POA: Diagnosis not present

## 2019-11-27 DIAGNOSIS — J45909 Unspecified asthma, uncomplicated: Secondary | ICD-10-CM | POA: Diagnosis not present

## 2019-11-27 DIAGNOSIS — F319 Bipolar disorder, unspecified: Secondary | ICD-10-CM | POA: Insufficient documentation

## 2019-11-27 DIAGNOSIS — Z86711 Personal history of pulmonary embolism: Secondary | ICD-10-CM | POA: Insufficient documentation

## 2019-11-27 DIAGNOSIS — Z8249 Family history of ischemic heart disease and other diseases of the circulatory system: Secondary | ICD-10-CM | POA: Diagnosis not present

## 2019-11-27 DIAGNOSIS — Z791 Long term (current) use of non-steroidal anti-inflammatories (NSAID): Secondary | ICD-10-CM | POA: Insufficient documentation

## 2019-11-27 DIAGNOSIS — Z79899 Other long term (current) drug therapy: Secondary | ICD-10-CM | POA: Diagnosis not present

## 2019-11-27 DIAGNOSIS — I2694 Multiple subsegmental pulmonary emboli without acute cor pulmonale: Secondary | ICD-10-CM

## 2019-11-27 DIAGNOSIS — I5032 Chronic diastolic (congestive) heart failure: Secondary | ICD-10-CM | POA: Diagnosis present

## 2019-11-27 DIAGNOSIS — F419 Anxiety disorder, unspecified: Secondary | ICD-10-CM | POA: Insufficient documentation

## 2019-11-27 DIAGNOSIS — I890 Lymphedema, not elsewhere classified: Secondary | ICD-10-CM | POA: Diagnosis not present

## 2019-11-27 DIAGNOSIS — I10 Essential (primary) hypertension: Secondary | ICD-10-CM

## 2019-11-27 NOTE — Progress Notes (Signed)
The patient c/o whole body pain today( pain level 8). She also reports N&V with palpitation with increase SOB.

## 2019-11-27 NOTE — Patient Instructions (Signed)
Continue weighing daily and call for an overnight weight gain of > 2 pounds or a weekly weight gain of >5 pounds. 

## 2019-12-01 ENCOUNTER — Telehealth: Payer: Self-pay | Admitting: Pulmonary Disease

## 2019-12-01 ENCOUNTER — Ambulatory Visit
Admission: RE | Admit: 2019-12-01 | Discharge: 2019-12-01 | Disposition: A | Discharge status: Home / Self Care | Payer: Medicare Other | Source: Ambulatory Visit | Attending: Family Medicine | Admitting: Family Medicine

## 2019-12-01 ENCOUNTER — Other Ambulatory Visit: Payer: Self-pay

## 2019-12-01 DIAGNOSIS — E041 Nontoxic single thyroid nodule: Secondary | ICD-10-CM | POA: Insufficient documentation

## 2019-12-01 NOTE — Telephone Encounter (Signed)
Just FYI for physician.  Referral placed for [facility] for Chronic diastolic heart failure.  Cardiology has attempted to contact patient, left multiple messages and Letter has been mailed out to reach out to patient to contact office to schedule.  Per Victorino Dike at [facility]: ----- Message ----- From: Joline Maxcy Sent: 09/29/2019   9:07 AM EST To: Salena Saner, MD Subject: Referral                                       3 attempts to schedule .  No ans no vm .  Mailing letter.  Closing referral .  Sent to physician as Lorain Childes. Rhonda J Cobb

## 2019-12-01 NOTE — Telephone Encounter (Signed)
Noted  

## 2019-12-08 ENCOUNTER — Other Ambulatory Visit: Payer: Self-pay

## 2019-12-09 ENCOUNTER — Inpatient Hospital Stay: Payer: Medicaid Other | Attending: Hematology and Oncology

## 2019-12-09 ENCOUNTER — Ambulatory Visit: Payer: Medicare Other

## 2019-12-25 ENCOUNTER — Other Ambulatory Visit: Payer: Self-pay

## 2019-12-25 ENCOUNTER — Ambulatory Visit: Payer: Medicare Other

## 2019-12-25 ENCOUNTER — Inpatient Hospital Stay: Payer: Medicaid Other | Attending: Hematology and Oncology

## 2019-12-25 DIAGNOSIS — E538 Deficiency of other specified B group vitamins: Secondary | ICD-10-CM

## 2019-12-25 MED ORDER — CYANOCOBALAMIN 1000 MCG/ML IJ SOLN: 1000.0000 ug | Freq: Once | INTRAMUSCULAR | Status: DC

## 2019-12-29 ENCOUNTER — Inpatient Hospital Stay: Payer: Medicare HMO | Attending: Family Medicine

## 2019-12-31 DIAGNOSIS — M545 Low back pain, unspecified: Secondary | ICD-10-CM | POA: Insufficient documentation

## 2020-01-01 ENCOUNTER — Ambulatory Visit: Payer: Medicare Other

## 2020-01-01 DIAGNOSIS — J45909 Unspecified asthma, uncomplicated: Secondary | ICD-10-CM | POA: Diagnosis not present

## 2020-01-05 ENCOUNTER — Other Ambulatory Visit: Payer: Self-pay

## 2020-01-05 ENCOUNTER — Emergency Department: Payer: Medicare HMO

## 2020-01-05 ENCOUNTER — Encounter: Payer: Self-pay | Admitting: Emergency Medicine

## 2020-01-05 ENCOUNTER — Emergency Department
Admission: EM | Admit: 2020-01-05 | Discharge: 2020-01-06 | Disposition: A | Discharge status: Home / Self Care | Payer: Medicare HMO | Attending: Emergency Medicine | Admitting: Emergency Medicine

## 2020-01-05 DIAGNOSIS — R079 Chest pain, unspecified: Secondary | ICD-10-CM

## 2020-01-05 DIAGNOSIS — Z20822 Contact with and (suspected) exposure to covid-19: Secondary | ICD-10-CM | POA: Diagnosis not present

## 2020-01-05 DIAGNOSIS — Z9119 Patient's noncompliance with other medical treatment and regimen: Secondary | ICD-10-CM | POA: Insufficient documentation

## 2020-01-05 DIAGNOSIS — M545 Low back pain, unspecified: Secondary | ICD-10-CM

## 2020-01-05 DIAGNOSIS — Z7901 Long term (current) use of anticoagulants: Secondary | ICD-10-CM | POA: Diagnosis not present

## 2020-01-05 DIAGNOSIS — R0789 Other chest pain: Secondary | ICD-10-CM | POA: Diagnosis not present

## 2020-01-05 DIAGNOSIS — E119 Type 2 diabetes mellitus without complications: Secondary | ICD-10-CM | POA: Insufficient documentation

## 2020-01-05 DIAGNOSIS — I110 Hypertensive heart disease with heart failure: Secondary | ICD-10-CM | POA: Diagnosis not present

## 2020-01-05 DIAGNOSIS — I5032 Chronic diastolic (congestive) heart failure: Secondary | ICD-10-CM | POA: Diagnosis not present

## 2020-01-05 DIAGNOSIS — M79605 Pain in left leg: Secondary | ICD-10-CM | POA: Diagnosis not present

## 2020-01-05 DIAGNOSIS — R69 Illness, unspecified: Secondary | ICD-10-CM | POA: Diagnosis not present

## 2020-01-05 DIAGNOSIS — Z79899 Other long term (current) drug therapy: Secondary | ICD-10-CM | POA: Insufficient documentation

## 2020-01-05 DIAGNOSIS — J4550 Severe persistent asthma, uncomplicated: Secondary | ICD-10-CM | POA: Diagnosis not present

## 2020-01-05 DIAGNOSIS — Z86711 Personal history of pulmonary embolism: Secondary | ICD-10-CM | POA: Diagnosis not present

## 2020-01-05 LAB — HEPATIC FUNCTION PANEL
ALT: 8 U/L (ref 0–44)
AST: 10 U/L — ABNORMAL LOW (ref 15–41)
Albumin: 3.6 g/dL (ref 3.5–5.0)
Alkaline Phosphatase: 66 U/L (ref 38–126)
Bilirubin, Direct: <0.1 mg/dL (ref 0.0–0.2)
Total Bilirubin: 0.5 mg/dL (ref 0.3–1.2)
Total Protein: 7 g/dL (ref 6.5–8.1)

## 2020-01-05 LAB — BASIC METABOLIC PANEL
Anion gap: 7 (ref 5–15)
BUN: 9 mg/dL (ref 6–20)
CO2: 28 mmol/L (ref 22–32)
Calcium: 8.9 mg/dL (ref 8.9–10.3)
Chloride: 99 mmol/L (ref 98–111)
Creatinine, Ser: 0.66 mg/dL (ref 0.44–1.00)
GFR calc Af Amer: >60 mL/min (ref >60)
GFR calc non Af Amer: >60 mL/min (ref >60)
Glucose, Bld: 100 mg/dL — ABNORMAL HIGH (ref 70–99)
Potassium: 3.8 mmol/L (ref 3.5–5.1)
Sodium: 134 mmol/L — ABNORMAL LOW (ref 135–145)

## 2020-01-05 LAB — LIPASE, BLOOD: Lipase: 37 U/L (ref 11–51)

## 2020-01-05 LAB — CBC
HCT: 40.4 % (ref 36.0–46.0)
Hemoglobin: 12.7 g/dL (ref 12.0–15.0)
MCH: 26.5 pg (ref 26.0–34.0)
MCHC: 31.4 g/dL (ref 30.0–36.0)
MCV: 84.3 fL (ref 80.0–100.0)
Platelets: 268 10*3/uL (ref 150–400)
RBC: 4.79 MIL/uL (ref 3.87–5.11)
RDW: 15.3 % (ref 11.5–15.5)
WBC: 4.1 10*3/uL (ref 4.0–10.5)
nRBC: 0 % (ref 0.0–0.2)

## 2020-01-05 LAB — BRAIN NATRIURETIC PEPTIDE: B Natriuretic Peptide: 34 pg/mL (ref 0.0–100.0)

## 2020-01-05 LAB — TROPONIN I (HIGH SENSITIVITY): Troponin I (High Sensitivity): 3 ng/L (ref <18)

## 2020-01-05 LAB — RESPIRATORY PANEL BY RT PCR (FLU A&B, COVID)
Influenza A by PCR: NEGATIVE
Influenza B by PCR: NEGATIVE
SARS Coronavirus 2 by RT PCR: NEGATIVE

## 2020-01-05 LAB — FIBRIN DERIVATIVES D-DIMER (ARMC ONLY): Fibrin derivatives D-dimer (ARMC): 245.33 ng/mL (FEU) (ref 0.00–499.00)

## 2020-01-05 LAB — POCT PREGNANCY, URINE: Preg Test, Ur: NEGATIVE

## 2020-01-05 MED ORDER — SODIUM CHLORIDE 0.9% FLUSH: 3.0000 mL | Freq: Once | INTRAVENOUS | Status: DC

## 2020-01-05 MED ORDER — OXYCODONE HCL 5 MG PO TABS: 5.0000 mg | ORAL_TABLET | Freq: Three times a day (TID) | ORAL | 0 refills | Status: AC | PRN

## 2020-01-05 MED ORDER — LIDOCAINE 5 % EX PTCH
1.0000 | MEDICATED_PATCH | CUTANEOUS | Status: DC
  Administered 2020-01-05: 1 via TRANSDERMAL
  Filled 2020-01-05: qty 1

## 2020-01-05 MED ORDER — IOHEXOL 350 MG/ML SOLN
125.0000 mL | Freq: Once | INTRAVENOUS | Status: AC | PRN
  Administered 2020-01-05: 125 mL via INTRAVENOUS

## 2020-01-05 MED ORDER — ACETAMINOPHEN 500 MG PO TABS
1000.0000 mg | ORAL_TABLET | Freq: Once | ORAL | Status: AC
  Administered 2020-01-05: 1000 mg via ORAL
  Filled 2020-01-05: qty 2

## 2020-01-05 MED ORDER — HYDROMORPHONE HCL 1 MG/ML IJ SOLN
0.5000 mg | Freq: Once | INTRAMUSCULAR | Status: AC
  Administered 2020-01-05: 0.5 mg via INTRAVENOUS
  Filled 2020-01-05: qty 1

## 2020-01-05 MED ORDER — SODIUM CHLORIDE 0.9 % IV BOLUS
500.0000 mL | Freq: Once | INTRAVENOUS | Status: AC
  Administered 2020-01-05: 13:00:00 500 mL via INTRAVENOUS

## 2020-01-05 MED ORDER — OXYCODONE HCL 5 MG PO TABS
5.0000 mg | ORAL_TABLET | Freq: Once | ORAL | Status: AC
  Administered 2020-01-05: 5 mg via ORAL
  Filled 2020-01-05: qty 1

## 2020-01-05 NOTE — Discharge Instructions (Addendum)
Your CT scan is as below.  Is important that you your blood thinner.  Take Tylenol 1 g every 8 hours.  I will give you a few pain medication but given that you have had frequent fills for multiple pain medications in the past as well as you are on a muscle relaxant and clonazepam you will need to follow-up with your primary care doctor for further prescriptions.  You also should consider seeing a pain specialist.  You can return to the ER if you develop worsening shortness of breath, inability to move your legs or any other concerns.  Your spine does show some degenerative changes which is most likely causing your back pain.     Musculoskeletal: No acute osseous abnormality. No aggressive osseous  lesion. Degenerative disc disease with disc height loss at L5-S1.  Broad-based disc osteophyte complex at L5-S1.   Review of the MIP images confirms the above findings.   IMPRESSION:  1. No aortic aneurysm or dissection.  2. No acute cardiopulmonary disease. No acute abdominal or pelvic  pathology.

## 2020-01-05 NOTE — ED Provider Notes (Signed)
[facility] Department Provider Note  ____________________________________________   First MD Initiated Contact with Patient 01/05/20 [phone]     (approximate)  I have reviewed the triage vital signs and the nursing notes.   HISTORY  Chief Complaint Chest Pain, Back Pain, and Leg Pain    HPI Andrea Miller is a 34 y.o. female with asthma, bipolar, CHF, hypertension, prior PE, prior SVT who comes in with chest pain, back pain, leg pain.  Patient states that she has had some upper chest pain that started a few days ago.  It is mostly in her left chest wall.  Occasionally radiates down her left arm.  The pain is constant, worse with palpation, has not take anything to help with the pain.  Patient also endorses a little bit of lower back pain.  Seems to be separate pain from her chest pain.  Patient states that she was told she had herniated disc and they are planning to do surgery on it but they never did due to her needing to be started on Eliquis due to having a blood clot.  Patient states that pain seems to be a little bit worse.  States she takes a muscle relaxant for it.  She also endorses some pain in her left leg.  Denies any swelling.  Good pulse.  Patient states that she has not been taking her blood thinner for 2 weeks due to insurance issues.  She is also been noncompliant with her Lasix for the past few days because she has felt dehydrated.          Past Medical History:  Diagnosis Date  . Anxiety   . Asthma   . Bipolar disorder (HCC)   . Chest pain    and angina  . CHF (congestive heart failure) (HCC)   . Depression   . Fibromyalgia   . Hypertension   . 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 Obesity   . Pre-diabetes   . Pulmonary embolus (HCC)   . Seizures (HCC)    secondary to anxiety  last seizure 01/25/18  . SVT (supraventricular tachycardia) (HCC)    with hx of syncope; managed by [facility]     Patient Active Problem List   Diagnosis Date Noted  . Chronic anticoagulation 05/07/2019  . Iron deficiency anemia due to chronic blood loss 10/15/2018  . B12 deficiency 10/13/2018  . Symptomatic anemia 10/11/2018  . Multiple subsegmental pulmonary emboli without acute cor pulmonale (HCC) 09/15/2018  . Morbid obesity with BMI of 50.0-59.9, adult (HCC) 09/15/2018  . Chronic diastolic heart failure (HCC) 07/01/2018  . Lymphedema 07/01/2018  . Chronic cholecystitis   . Edema 02/13/2018  . Diabetes mellitus type 2, uncomplicated (HCC) 02/13/2018  . Hypertension 11/23/2017  . Asthma exacerbation 04/14/2017  . Asthma without status asthmaticus 01/16/2017  . Bipolar affective (HCC) 01/16/2017  . Coronary artery disease 01/16/2017  . Syncope 12/12/2016  . Fibromyalgia 04/27/2014  . OSA (obstructive sleep apnea) 04/27/2014  . Seizure-like activity (HCC) 04/27/2014  . Migraine without status migrainosus, not intractable 04/27/2014  . Obesity 04/15/2014  . Sleep disorder 04/15/2014  . Neck pain 04/15/2014  . Headache 04/15/2014  . Restless legs syndrome 04/15/2014  . Supraventricular tachycardia (HCC) 07/13/2009    Past Surgical History:  Procedure Laterality Date  . CHOLECYSTECTOMY N/A 02/22/2018   Procedure: LAPAROSCOPIC CHOLECYSTECTOMY;  Surgeon: Ancil Linsey, MD;  Location: [facility] ORS;  Service: General;  Laterality: N/A;  .  TONSILLECTOMY AND ADENOIDECTOMY N/A 09/05/2016   Procedure: TONSILLECTOMY AND ADENOIDECTOMY;  Surgeon: Linus Salmons, MD;  Location: [facility] ORS;  Service: ENT;  Laterality: N/A;    Prior to Admission medications   Medication Sig Start Date End Date Taking? Authorizing Provider  Adapalene 0.3 % gel Apply topically at bedtime.  10/20/19   [provider]  albuterol (PROVENTIL) (2.5 MG/3ML) 0.083% nebulizer solution Take 3 mLs (2.5 mg total) by nebulization every 4 (four) hours as needed for wheezing or shortness of breath. 09/23/19 11/27/19   Salena Saner, MD  Albuterol Sulfate 108 (90 Base) MCG/ACT AEPB Inhale 2 puffs into the lungs every 6 (six) hours as needed. 09/23/19 11/27/19  Salena Saner, MD  budesonide-formoterol [facility]) 160-4.5 MCG/ACT inhaler Inhale 2 puffs into the lungs 2 (two) times daily. 09/23/19 11/27/19  Salena Saner, MD  busPIRone (BUSPAR) 10 MG tablet Take 10 mg by mouth 2 (two) times daily.  10/22/19   [provider]  Calcium-Magnesium 500-250 MG TABS Take 1 tablet by mouth 2 (two) times daily.     [provider]  clonazePAM (KLONOPIN) 1 MG tablet Take 1 tablet by mouth 2 (two) times a day. 04/02/19   [provider]  cyanocobalamin (,VITAMIN B-12,) 1000 MCG/ML injection Inject 1,000 mcg into the muscle every 30 (thirty) days.    [provider]  Dexlansoprazole (DEXILANT) 30 MG capsule Take 30 mg by mouth daily.    [provider]  diclofenac sodium (VOLTAREN) 1 % GEL Apply 4 g topically daily as needed (for pain).    [provider]  diltiazem (CARDIZEM CD) 360 MG 24 hr capsule Take 360 mg by mouth every morning. 12/18/18   [provider]  escitalopram (LEXAPRO) 20 MG tablet Take 20 mg by mouth daily.    [provider]  fluticasone (FLONASE) 50 MCG/ACT nasal spray Place 2 sprays into both nostrils daily as needed for allergies.     [provider]  folic acid (FOLVITE) 1 MG tablet Take 1 mg by mouth daily.  12/15/16   [provider]  furosemide (LASIX) 20 MG tablet Take 40 mg by mouth 2 (two) times daily.     [provider]  gabapentin (NEURONTIN) 400 MG capsule Take 400 mg by mouth 3 (three) times daily.    [provider]  hydroxypropyl methylcellulose / hypromellose (ISOPTO TEARS / GONIOVISC) 2.5 % ophthalmic solution Place 1 drop into both eyes 3 (three) times daily as needed for dry eyes.    [provider]  Iron-Vitamin C 65-125 MG TABS Take 1 tablet by mouth 2 (two)  times daily. 09/19/19   Rosey Bath, MD  ketoconazole (NIZORAL) 2 % shampoo Apply 1 application topically every 30 (thirty) days.  10/01/18   [provider]  lactulose (CHRONULAC) 10 GM/15ML solution Take 30 g by mouth daily as needed for mild constipation.  08/28/18   [provider]  lamoTRIgine (LAMICTAL) 200 MG tablet Take 1 tablet by mouth 2 (two) times a day. 05/20/19   [provider]  linaclotide (LINZESS) 290 MCG CAPS capsule Take 290 mcg by mouth daily before breakfast.    [provider]  montelukast (SINGULAIR) 10 MG tablet Take 1 tablet (10 mg total) by mouth at bedtime. 09/23/19 12/22/19  Salena Saner, MD  ondansetron (ZOFRAN ODT) 4 MG disintegrating tablet Take 1 tablet (4 mg total) by mouth every 8 (eight) hours as needed. 11/11/19   Nita Sickle, MD  Potassium  Chloride ER 20 MEQ TBCR Take 20 mEq by mouth 2 (two) times daily.     [provider]  potassium chloride SA (KLOR-CON) 20 MEQ tablet Take 1 tablet (20 mEq total) by mouth daily. While taking metolazone 11/18/19   Clarisa Kindred A, FNP  prednisoLONE acetate (PRED FORTE) 1 % ophthalmic suspension Place 1 drop into the right eye 4 (four) times daily.  10/15/19   [provider]  propranolol (INDERAL) 40 MG tablet Take 2 tablets (80 mg total) by mouth 3 (three) times daily. 10/13/18   Salary, Evelena Asa, MD  risperiDONE (RISPERDAL) 3 MG tablet Take 3 mg by mouth at bedtime.     [provider]  rivaroxaban (XARELTO) 20 MG TABS tablet Take 20 mg by mouth every morning.    [provider]  rosuvastatin (CRESTOR) 20 MG tablet Take 20 mg by mouth daily.    [provider]  tiZANidine (ZANAFLEX) 4 MG tablet Take 4 mg by mouth 3 (three) times daily.  03/26/17   [provider]  Topiramate ER (TROKENDI XR) 200 MG CP24 Take 1 capsule by mouth daily.     [provider]  vitamin C (VITAMIN C) 250 MG tablet Take 1 tablet (250 mg  total) by mouth 2 (two) times daily. 10/13/18   Salary, Evelena Asa, MD    Allergies Cortizone-10 [hydrocortisone], Garlic, Prednisone, and Corticosteroids  Family History  Problem Relation Age of Onset  . Diabetes Mother   . Hypertension Mother   . Asthma Mother   . Gallstones Mother   . Diabetes Father   . Hypertension Father   . Gallstones Father   . Bipolar disorder Sister   . COPD Brother   . Schizophrenia Brother   . COPD Brother   . Hypertension Brother     Social History Social History   Tobacco Use  . Smoking status: Never Smoker  . Smokeless tobacco: Never Used  Substance Use Topics  . Alcohol use: No  . Drug use: No      Review of Systems Constitutional: No fever/chills Eyes: No visual changes. ENT: No sore throat. Cardiovascular: Positive chest pain Respiratory: Positive shortness of breath, positive cough Gastrointestinal: No abdominal pain.  No nausea, no vomiting.  No diarrhea.  No constipation. Genitourinary: Negative for dysuria. Musculoskeletal: Positive for back pain.  Positive leg pain Skin: Negative for rash. Neurological: Negative for headaches, focal weakness or numbness. All other ROS negative ____________________________________________   PHYSICAL EXAM:  VITAL SIGNS: ED Triage Vitals  Enc Vitals Group     BP 01/05/20 0736 (!) 147/94     Pulse Rate 01/05/20 0736 96     Resp 01/05/20 0736 18     Temp 01/05/20 0736 98.7 F (37.1 C)     Temp Source 01/05/20 0736 Oral     SpO2 01/05/20 0736 100 %     Weight 01/05/20 0733 (!) 330 lb (149.7 kg)     Height 01/05/20 0733 5\' 10"  (1.778 m)     Head Circumference --      Peak Flow --      Pain Score 01/05/20 0732 10     Pain Loc --      Pain Edu? --      Excl. in GC? --     Constitutional: Alert and oriented. Well appearing and in no acute distress.  Overweight female. Eyes: Conjunctivae are normal. EOMI. Head: Atraumatic. Nose: No congestion/rhinnorhea. Mouth/Throat: Mucous  membranes are moist.   Neck: No  stridor. Trachea Midline. FROM Cardiovascular: Normal rate, regular rhythm. Grossly normal heart sounds.  Good peripheral circulation.  Chest wall tenderness Respiratory: Normal respiratory effort.  No retractions. Lungs CTAB. Gastrointestinal: Soft and nontender. No distention. No abdominal bruits.  Musculoskeletal: No lower extremity tenderness nor edema.  No joint effusions.  Equal strength in both upper legs.  No sensation deficits.  Good distal pulses in all extremities.  No obvious swelling noted. Reported pain in the left leg.  Neurologic:  Normal speech and language. No gross focal neurologic deficits are appreciated.  Skin:  Skin is warm, dry and intact. No rash noted. Psychiatric: Mood and affect are normal. Speech and behavior are normal. GU: Deferred   ____________________________________________   LABS (all labs ordered are listed, but only abnormal results are displayed)  Labs Reviewed  BASIC METABOLIC PANEL - Abnormal; Notable for the following components:      Result Value   Sodium 134 (*)    Glucose, Bld 100 (*)    All other components within normal limits  HEPATIC FUNCTION PANEL - Abnormal; Notable for the following components:   AST 10 (*)    All other components within normal limits  RESPIRATORY PANEL BY RT PCR (FLU A&B, COVID)  CBC  BRAIN NATRIURETIC PEPTIDE  FIBRIN DERIVATIVES D-DIMER ([facility] ONLY)  LIPASE, BLOOD  POCT PREGNANCY, URINE  POC URINE PREG, ED  TROPONIN I (HIGH SENSITIVITY)  TROPONIN I (HIGH SENSITIVITY)   ____________________________________________   ED ECG REPORT I, Vanessa Byers, the attending physician, personally viewed and interpreted this ECG.  EKG is normal sinus rhythm 97, no ST elevation, does with a flipped T wave in lead III with a S1Q3T3, normal intervals.  Reviewed prior EKGs and this looks similar ____________________________________________  RADIOLOGY Robert Bellow, personally viewed and  evaluated these images (plain radiographs) as part of my medical decision making, as well as reviewing the written report by the radiologist.  ED MD interpretation: No evidence of pneumonia  Official radiology report(s): DG Chest 2 View  Result Date: 01/05/2020 CLINICAL DATA:  Chest pain and shortness of breath. EXAM: CHEST - 2 VIEW COMPARISON:  Chest x-ray dated November 10, 2019. FINDINGS: The heart size and mediastinal contours are within normal limits. Low lung volumes are present, causing crowding of the pulmonary vasculature. No focal consolidation, pleural effusion, or pneumothorax. No acute osseous abnormality. IMPRESSION: No active cardiopulmonary disease. Electronically Signed   By: Titus Dubin M.D.   On: 01/05/2020 08:04    ____________________________________________   PROCEDURES  Procedure(s) performed (including Critical Care):  Procedures   ____________________________________________   INITIAL IMPRESSION / ASSESSMENT AND PLAN / ED COURSE   AHTZIRY SAATHOFF was evaluated in Emergency Department on 01/05/2020 for the symptoms described in the history of present illness. She was evaluated in the context of the global COVID-19 pandemic, which necessitated consideration that the patient might be at risk for infection with the SARS-CoV-2 virus that causes COVID-19. Institutional protocols and algorithms that pertain to the evaluation of patients at risk for COVID-19 are in a state of rapid change based on information released by regulatory bodies including the CDC and federal and state organizations. These policies and algorithms were followed during the patient's care in the ED.    Most Likely DDx:  -MSK (atypical chest pain) but will get cardiac markers to evaluate for ACS given risk factors/age -We will get chest x-ray and BNP to evaluate for pulmonary edema given she is not been compliant with her  Lasix. -We will get D-dimer given she is not been compliant with her blood  thinner. -We will get coronavirus testing given positive cough and shortness of breath   DDx that was also considered d/t potential to cause harm, but was found less likely based on history and physical (as detailed above): -PNA (no fevers, cough but CXR to evaluate) -PNX (reassured with equal b/l breath sounds, CXR to evaluate) -Symptomatic anemia (will get H&H) -Aortic Dissection as no tearing pain and no radiation to the mid back, pulses equal -Pericarditis no rub on exam, EKG changes or hx to suggest dx -Tamponade (no notable SOB, tachycardic, hypotensive) -Esophageal rupture (no h/o diffuse vomitting/no crepitus)   Patient states she does have a history of chronic pain and fibromyalgia but states this is way worse than her normal pain.  Her labs are reassuring.  No evidence of blood clot.  D-dimer is negative.  Troponin is negative.  BNP is negative.  Ultrasound of the leg is negative..  Pt stating her pain is still severe. Will get  CT dissection to make sure there is no evidence of aortic dissection given her chest and back pain.  I have lower suspicion for PE and patient needs to be on her blood thinner anyways and she clearly does not have a massive or submassive PE given she is hemodynamically stable with negative D-dimer, troponin, BNP.  CT scan is negative.  Patient does have some degenerative disc disease which is most likely causing her lower back pain.  She was told that she would need to have surgery on this at some point.  She is no signs of cord compression at this time.  She had good strength in her bilateral legs.   Reevaluated patient updated on the results.  Patient feels comfortable with discharge home.  Encouraged Tylenol, restarting her blood thinner and will give a few oxycodone.  Patient will follow with her primary doctor.  Reviewed patient's database.  She has frequent fills for Tylenol codeine but none since October.  Will give patient refills and have her  follow-up with her primary care doctor if she requires more medications.  She also should see a pain specialist.  I discussed the provisional nature of ED diagnosis, the treatment so far, the ongoing plan of care, follow up appointments and return precautions with the patient and any family or support people present. They expressed understanding and agreed with the plan, discharged home.   ____________________________________________   FINAL CLINICAL IMPRESSION(S) / ED DIAGNOSES   Final diagnoses:  Low back pain without sciatica, unspecified back pain laterality, unspecified chronicity  Chest pain, unspecified type     MEDICATIONS GIVEN DURING THIS VISIT:  Medications  sodium chloride flush (NS) 0.9 % injection 3 mL (3 mLs Intravenous Not Given 01/05/20 1309)  lidocaine (LIDODERM) 5 % 1 patch (1 patch Transdermal Patch Applied 01/05/20 0830)  oxyCODONE (Oxy IR/ROXICODONE) immediate release tablet 5 mg (5 mg Oral Given 01/05/20 0828)  acetaminophen (TYLENOL) tablet 1,000 mg (1,000 mg Oral Given 01/05/20 0828)  HYDROmorphone (DILAUDID) injection 0.5 mg (0.5 mg Intravenous Given 01/05/20 1309)  sodium chloride 0.9 % bolus 500 mL (500 mLs Intravenous New Bag/Given 01/05/20 1309)  iohexol (OMNIPAQUE) 350 MG/ML injection 125 mL (125 mLs Intravenous Contrast Given 01/05/20 1232)     ED Discharge Orders         Ordered    oxyCODONE (ROXICODONE) 5 MG immediate release tablet  Every 8 hours PRN     01/05/20 1405  Note:  This document was prepared using Dragon voice recognition software and may include unintentional dictation errors.   Concha Se, MD 01/05/20 [phone]

## 2020-01-05 NOTE — ED Notes (Signed)
Pt discharged home after verbalizing understanding of discharge instructions; nad noted. 

## 2020-01-05 NOTE — ED Notes (Signed)
Spoke with care management; pt has medicaid and medicare and should have no problem with prescriptions.   Pt states Phineas Real told her the prescriptions were too expensive. Told her to go to pharmacy and they will advise and dispense.

## 2020-01-05 NOTE — Care Management (Signed)
TOC RN CM: Received call from nurse advising patient is needing assistance with medication cost. Advised patient has Medicaid which covers medication with copay of $3.00/$1.00. Patient concerned that Eliquis or Xarelto is not covered. Advised patient to take prescription to pharmacy for filling and they would direct on which one is preferred. Patient has no concerns for cost of copay.No other concerns. Patient is being discharged to home.

## 2020-01-05 NOTE — ED Notes (Signed)
Redraw of lavender, light green, blue tubes.

## 2020-01-05 NOTE — ED Triage Notes (Signed)
Pt here with c/o midsternal cp that radiates to bilateral arms, pain in lower back radiating down legs bilaterally L leg > R leg pain. Pain in chest, back and legs began 2 days ago. Pt walked to triage with no issues. Nad.

## 2020-01-05 NOTE — ED Notes (Signed)
Pt does admit to hx of asthma, has had some SHOB the past 2 days and has been coughing as well. NAD in triage-no respiratory distress at this time.

## 2020-01-06 DIAGNOSIS — M545 Low back pain: Secondary | ICD-10-CM | POA: Diagnosis not present

## 2020-01-06 NOTE — ED Notes (Signed)
Delay in discharge from medical record.

## 2020-01-07 DIAGNOSIS — J4550 Severe persistent asthma, uncomplicated: Secondary | ICD-10-CM | POA: Diagnosis not present

## 2020-01-07 DIAGNOSIS — G894 Chronic pain syndrome: Secondary | ICD-10-CM | POA: Diagnosis not present

## 2020-01-07 DIAGNOSIS — M5416 Radiculopathy, lumbar region: Secondary | ICD-10-CM | POA: Diagnosis not present

## 2020-01-07 DIAGNOSIS — M4716 Other spondylosis with myelopathy, lumbar region: Secondary | ICD-10-CM | POA: Diagnosis not present

## 2020-01-07 DIAGNOSIS — Z79899 Other long term (current) drug therapy: Secondary | ICD-10-CM | POA: Diagnosis not present

## 2020-01-07 DIAGNOSIS — Z1389 Encounter for screening for other disorder: Secondary | ICD-10-CM | POA: Diagnosis not present

## 2020-01-07 DIAGNOSIS — M5126 Other intervertebral disc displacement, lumbar region: Secondary | ICD-10-CM | POA: Diagnosis not present

## 2020-01-07 DIAGNOSIS — Z5181 Encounter for therapeutic drug level monitoring: Secondary | ICD-10-CM | POA: Diagnosis not present

## 2020-01-09 DIAGNOSIS — J4550 Severe persistent asthma, uncomplicated: Secondary | ICD-10-CM | POA: Diagnosis not present

## 2020-01-09 DIAGNOSIS — M4716 Other spondylosis with myelopathy, lumbar region: Secondary | ICD-10-CM | POA: Diagnosis not present

## 2020-01-09 DIAGNOSIS — M5416 Radiculopathy, lumbar region: Secondary | ICD-10-CM | POA: Diagnosis not present

## 2020-01-09 DIAGNOSIS — M5126 Other intervertebral disc displacement, lumbar region: Secondary | ICD-10-CM | POA: Diagnosis not present

## 2020-01-09 DIAGNOSIS — G894 Chronic pain syndrome: Secondary | ICD-10-CM | POA: Diagnosis not present

## 2020-01-12 DIAGNOSIS — J4550 Severe persistent asthma, uncomplicated: Secondary | ICD-10-CM | POA: Diagnosis not present

## 2020-01-13 ENCOUNTER — Inpatient Hospital Stay: Payer: Medicare HMO

## 2020-01-14 DIAGNOSIS — J4550 Severe persistent asthma, uncomplicated: Secondary | ICD-10-CM | POA: Diagnosis not present

## 2020-01-14 DIAGNOSIS — Z20822 Contact with and (suspected) exposure to covid-19: Secondary | ICD-10-CM | POA: Diagnosis not present

## 2020-01-14 DIAGNOSIS — R079 Chest pain, unspecified: Secondary | ICD-10-CM | POA: Diagnosis not present

## 2020-01-14 DIAGNOSIS — E785 Hyperlipidemia, unspecified: Secondary | ICD-10-CM | POA: Diagnosis not present

## 2020-01-14 DIAGNOSIS — I10 Essential (primary) hypertension: Secondary | ICD-10-CM | POA: Diagnosis not present

## 2020-01-15 DIAGNOSIS — J40 Bronchitis, not specified as acute or chronic: Secondary | ICD-10-CM | POA: Diagnosis not present

## 2020-01-16 ENCOUNTER — Encounter: Payer: Self-pay | Admitting: Emergency Medicine

## 2020-01-16 ENCOUNTER — Other Ambulatory Visit: Payer: Self-pay

## 2020-01-16 ENCOUNTER — Emergency Department: Payer: Medicare HMO

## 2020-01-16 DIAGNOSIS — Z79899 Other long term (current) drug therapy: Secondary | ICD-10-CM | POA: Diagnosis not present

## 2020-01-16 DIAGNOSIS — I110 Hypertensive heart disease with heart failure: Secondary | ICD-10-CM | POA: Insufficient documentation

## 2020-01-16 DIAGNOSIS — Z9049 Acquired absence of other specified parts of digestive tract: Secondary | ICD-10-CM | POA: Diagnosis not present

## 2020-01-16 DIAGNOSIS — I5032 Chronic diastolic (congestive) heart failure: Secondary | ICD-10-CM | POA: Diagnosis not present

## 2020-01-16 DIAGNOSIS — I2510 Atherosclerotic heart disease of native coronary artery without angina pectoris: Secondary | ICD-10-CM | POA: Diagnosis not present

## 2020-01-16 DIAGNOSIS — J4550 Severe persistent asthma, uncomplicated: Secondary | ICD-10-CM | POA: Diagnosis not present

## 2020-01-16 DIAGNOSIS — R0602 Shortness of breath: Secondary | ICD-10-CM | POA: Diagnosis not present

## 2020-01-16 DIAGNOSIS — R918 Other nonspecific abnormal finding of lung field: Secondary | ICD-10-CM | POA: Diagnosis not present

## 2020-01-16 DIAGNOSIS — E119 Type 2 diabetes mellitus without complications: Secondary | ICD-10-CM | POA: Diagnosis not present

## 2020-01-16 DIAGNOSIS — J40 Bronchitis, not specified as acute or chronic: Secondary | ICD-10-CM | POA: Insufficient documentation

## 2020-01-16 DIAGNOSIS — R05 Cough: Secondary | ICD-10-CM | POA: Diagnosis present

## 2020-01-16 DIAGNOSIS — Z7901 Long term (current) use of anticoagulants: Secondary | ICD-10-CM | POA: Insufficient documentation

## 2020-01-16 DIAGNOSIS — Z20822 Contact with and (suspected) exposure to covid-19: Secondary | ICD-10-CM | POA: Diagnosis not present

## 2020-01-16 DIAGNOSIS — R000 Tachycardia, unspecified: Secondary | ICD-10-CM | POA: Diagnosis not present

## 2020-01-16 LAB — CBC
HCT: 38.4 % (ref 36.0–46.0)
Hemoglobin: 12.2 g/dL (ref 12.0–15.0)
MCH: 26.5 pg (ref 26.0–34.0)
MCHC: 31.8 g/dL (ref 30.0–36.0)
MCV: 83.3 fL (ref 80.0–100.0)
Platelets: 262 10*3/uL (ref 150–400)
RBC: 4.61 MIL/uL (ref 3.87–5.11)
RDW: 14.8 % (ref 11.5–15.5)
WBC: 6.8 10*3/uL (ref 4.0–10.5)
nRBC: 0 % (ref 0.0–0.2)

## 2020-01-16 MED ORDER — SODIUM CHLORIDE 0.9% FLUSH
3.0000 mL | Freq: Once | INTRAVENOUS | Status: AC
  Administered 2020-01-17: 3 mL via INTRAVENOUS

## 2020-01-16 MED ORDER — ONDANSETRON HCL 4 MG/2ML IJ SOLN
4.0000 mg | Freq: Once | INTRAMUSCULAR | Status: AC
  Administered 2020-01-16: 4 mg via INTRAVENOUS
  Filled 2020-01-16: qty 2

## 2020-01-16 NOTE — ED Triage Notes (Signed)
Patient wheelchair to triage with complaints of cold symptoms (cough, chills and body aches), CP (central sharp pressure) with SOB, and "I feel my heart racing" for 7 days.  Pt reports taking muscle relaxer, "heart medicine, tylenol and NyQuil within the last 24 hours. Speaking in complete coherent sentences. No acute breathing distress noted.  Pt reports having home health aid for back problems and hx of tachycardia

## 2020-01-16 NOTE — ED Notes (Signed)
Pt vomiting in triage, pt drinking water - asked not drink yet, pt given new blanket, mask and emesis bag

## 2020-01-17 ENCOUNTER — Emergency Department: Payer: Medicare HMO

## 2020-01-17 ENCOUNTER — Emergency Department
Admission: EM | Admit: 2020-01-17 | Discharge: 2020-01-17 | Disposition: A | Discharge status: Home / Self Care | Payer: Medicare HMO | Attending: Emergency Medicine | Admitting: Emergency Medicine

## 2020-01-17 DIAGNOSIS — R52 Pain, unspecified: Secondary | ICD-10-CM

## 2020-01-17 DIAGNOSIS — J40 Bronchitis, not specified as acute or chronic: Secondary | ICD-10-CM

## 2020-01-17 DIAGNOSIS — R0602 Shortness of breath: Secondary | ICD-10-CM | POA: Diagnosis not present

## 2020-01-17 DIAGNOSIS — Z20822 Contact with and (suspected) exposure to covid-19: Secondary | ICD-10-CM | POA: Diagnosis not present

## 2020-01-17 DIAGNOSIS — R918 Other nonspecific abnormal finding of lung field: Secondary | ICD-10-CM | POA: Diagnosis not present

## 2020-01-17 DIAGNOSIS — R000 Tachycardia, unspecified: Secondary | ICD-10-CM | POA: Diagnosis not present

## 2020-01-17 LAB — URINALYSIS, COMPLETE (UACMP) WITH MICROSCOPIC
Bilirubin Urine: NEGATIVE
Glucose, UA: NEGATIVE mg/dL
Ketones, ur: NEGATIVE mg/dL
Leukocytes,Ua: NEGATIVE
Nitrite: NEGATIVE
Protein, ur: NEGATIVE mg/dL
RBC / HPF: >50 RBC/hpf — ABNORMAL HIGH (ref 0–5)
Specific Gravity, Urine: 1.011 (ref 1.005–1.030)
pH: 6 (ref 5.0–8.0)

## 2020-01-17 LAB — COMPREHENSIVE METABOLIC PANEL
ALT: 9 U/L (ref 0–44)
AST: 14 U/L — ABNORMAL LOW (ref 15–41)
Albumin: 3.6 g/dL (ref 3.5–5.0)
Alkaline Phosphatase: 73 U/L (ref 38–126)
Anion gap: 16 — ABNORMAL HIGH (ref 5–15)
BUN: 8 mg/dL (ref 6–20)
CO2: 25 mmol/L (ref 22–32)
Calcium: 9.3 mg/dL (ref 8.9–10.3)
Chloride: 97 mmol/L — ABNORMAL LOW (ref 98–111)
Creatinine, Ser: 0.57 mg/dL (ref 0.44–1.00)
GFR calc Af Amer: >60 mL/min (ref >60)
GFR calc non Af Amer: >60 mL/min (ref >60)
Glucose, Bld: 118 mg/dL — ABNORMAL HIGH (ref 70–99)
Potassium: 3.3 mmol/L — ABNORMAL LOW (ref 3.5–5.1)
Sodium: 138 mmol/L (ref 135–145)
Total Bilirubin: 0.4 mg/dL (ref 0.3–1.2)
Total Protein: 7.3 g/dL (ref 6.5–8.1)

## 2020-01-17 LAB — SARS CORONAVIRUS 2 (TAT 6-24 HRS): SARS Coronavirus 2: NEGATIVE

## 2020-01-17 LAB — LACTIC ACID, PLASMA: Lactic Acid, Venous: 1.3 mmol/L (ref 0.5–1.9)

## 2020-01-17 LAB — LIPASE, BLOOD: Lipase: 27 U/L (ref 11–51)

## 2020-01-17 LAB — TROPONIN I (HIGH SENSITIVITY)
Troponin I (High Sensitivity): 3 ng/L (ref <18)
Troponin I (High Sensitivity): 3 ng/L (ref <18)

## 2020-01-17 MED ORDER — MAGIC MOUTHWASH: 5.0000 mL | Freq: Three times a day (TID) | ORAL | 0 refills | Status: DC | PRN

## 2020-01-17 MED ORDER — HYDROCOD POLST-CPM POLST ER 10-8 MG/5ML PO SUER: 5.0000 mL | Freq: Two times a day (BID) | ORAL | 0 refills | Status: DC

## 2020-01-17 MED ORDER — KETOROLAC TROMETHAMINE 30 MG/ML IJ SOLN
10.0000 mg | Freq: Once | INTRAMUSCULAR | Status: AC
  Administered 2020-01-17: 9.9 mg via INTRAVENOUS
  Filled 2020-01-17: qty 1

## 2020-01-17 MED ORDER — ONDANSETRON 4 MG PO TBDP: 4.0000 mg | ORAL_TABLET | Freq: Three times a day (TID) | ORAL | 0 refills | Status: DC | PRN

## 2020-01-17 MED ORDER — HYDROCOD POLST-CPM POLST ER 10-8 MG/5ML PO SUER
5.0000 mL | Freq: Once | ORAL | Status: AC
  Administered 2020-01-17: 5 mL via ORAL
  Filled 2020-01-17: qty 5

## 2020-01-17 MED ORDER — ONDANSETRON HCL 4 MG/2ML IJ SOLN
4.0000 mg | Freq: Once | INTRAMUSCULAR | Status: AC
  Administered 2020-01-17: 4 mg via INTRAVENOUS
  Filled 2020-01-17: qty 2

## 2020-01-17 MED ORDER — MAGIC MOUTHWASH
10.0000 mL | Freq: Once | ORAL | Status: AC
  Administered 2020-01-17: 10 mL via ORAL
  Filled 2020-01-17: qty 10

## 2020-01-17 MED ORDER — AZITHROMYCIN 250 MG PO TABS: 250.0000 mg | ORAL_TABLET | Freq: Every day | ORAL | 0 refills | Status: DC

## 2020-01-17 MED ORDER — AZITHROMYCIN 500 MG PO TABS
500.0000 mg | ORAL_TABLET | Freq: Once | ORAL | Status: AC
  Administered 2020-01-17: 500 mg via ORAL
  Filled 2020-01-17: qty 1

## 2020-01-17 MED ORDER — LIDOCAINE VISCOUS HCL 2 % MT SOLN
15.0000 mL | Freq: Once | OROMUCOSAL | Status: AC
  Administered 2020-01-17: 15 mL via OROMUCOSAL
  Filled 2020-01-17: qty 15

## 2020-01-17 NOTE — ED Provider Notes (Signed)
[facility] Emergency Department Provider Note   ____________________________________________   First MD Initiated Contact with Patient 01/17/20 [phone]     (approximate)  I have reviewed the triage vital signs and the nursing notes.   HISTORY  Chief Complaint Chest Pain, Shortness of Breath, and Chills    HPI Andrea Miller is a 34 y.o. female who presents to the ED from home with a chief complaint of cold-like symptoms.  Patient reports onset of cough, chills and body aches, chest pressure with cough, shortness of breath 7 days ago.  Had a Covid test at that time which is negative.  Symptoms worse over the past 7 days. Negative Covid test at [facility] 3 days ago.  Today with nausea and vomiting.  Denies abdominal pain, dysuria or diarrhea.  States home health aide put Vicks VapoRub on her chest and under her nose to aid with congestion.       Past Medical History:  Diagnosis Date  . Anxiety   . Asthma   . Bipolar disorder (HCC)   . Chest pain    and angina  . CHF (congestive heart failure) (HCC)   . Depression   . Fibromyalgia   . Hypertension   . 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 Obesity   . Pre-diabetes   . Pulmonary embolus (HCC)   . Seizures (HCC)    secondary to anxiety  last seizure 01/25/18  . SVT (supraventricular tachycardia) (HCC)    with hx of syncope; managed by [facility]    Patient Active Problem List   Diagnosis Date Noted  . Chronic anticoagulation 05/07/2019  . Iron deficiency anemia due to chronic blood loss 10/15/2018  . B12 deficiency 10/13/2018  . Symptomatic anemia 10/11/2018  . Multiple subsegmental pulmonary emboli without acute cor pulmonale (HCC) 09/15/2018  . Morbid obesity with BMI of 50.0-59.9, adult (HCC) 09/15/2018  . Chronic diastolic heart failure (HCC) 07/01/2018  . Lymphedema 07/01/2018  . Chronic cholecystitis   . Edema 02/13/2018  . Diabetes mellitus  type 2, uncomplicated (HCC) 02/13/2018  . Hypertension 11/23/2017  . Asthma exacerbation 04/14/2017  . Asthma without status asthmaticus 01/16/2017  . Bipolar affective (HCC) 01/16/2017  . Coronary artery disease 01/16/2017  . Syncope 12/12/2016  . Fibromyalgia 04/27/2014  . OSA (obstructive sleep apnea) 04/27/2014  . Seizure-like activity (HCC) 04/27/2014  . Migraine without status migrainosus, not intractable 04/27/2014  . Obesity 04/15/2014  . Sleep disorder 04/15/2014  . Neck pain 04/15/2014  . Headache 04/15/2014  . Restless legs syndrome 04/15/2014  . Supraventricular tachycardia (HCC) 07/13/2009    Past Surgical History:  Procedure Laterality Date  . CHOLECYSTECTOMY N/A 02/22/2018   Procedure: LAPAROSCOPIC CHOLECYSTECTOMY;  Surgeon: Ancil Linsey, MD;  Location: [facility] ORS;  Service: General;  Laterality: N/A;  . TONSILLECTOMY AND ADENOIDECTOMY N/A 09/05/2016   Procedure: TONSILLECTOMY AND ADENOIDECTOMY;  Surgeon: Linus Salmons, MD;  Location: [facility] ORS;  Service: ENT;  Laterality: N/A;    Prior to Admission medications   Medication Sig Start Date End Date Taking? Authorizing Provider  Adapalene 0.3 % gel Apply topically at bedtime.  10/20/19   [provider]  albuterol (PROVENTIL) (2.5 MG/3ML) 0.083% nebulizer solution Take 3 mLs (2.5 mg total) by nebulization every 4 (four) hours as needed for wheezing or shortness of breath. 09/23/19 11/27/19  Salena Saner, MD  Albuterol Sulfate 108 (90 Base) MCG/ACT AEPB Inhale 2 puffs into the lungs every  6 (six) hours as needed. 09/23/19 11/27/19  Salena Saner, MD  azithromycin (ZITHROMAX) 250 MG tablet Take 1 tablet (250 mg total) by mouth daily. 01/17/20   Irean Hong, MD  budesonide-formoterol [facility]) 160-4.5 MCG/ACT inhaler Inhale 2 puffs into the lungs 2 (two) times daily. 09/23/19 11/27/19  Salena Saner, MD  busPIRone (BUSPAR) 10 MG tablet Take 10 mg by mouth 2 (two) times daily.  10/22/19    [provider]  Calcium-Magnesium 500-250 MG TABS Take 1 tablet by mouth 2 (two) times daily.     [provider]  chlorpheniramine-HYDROcodone (TUSSIONEX PENNKINETIC ER) 10-8 MG/5ML SUER Take 5 mLs by mouth 2 (two) times daily. 01/17/20   Irean Hong, MD  clonazePAM (KLONOPIN) 1 MG tablet Take 1 tablet by mouth 2 (two) times a day. 04/02/19   [provider]  cyanocobalamin (,VITAMIN B-12,) 1000 MCG/ML injection Inject 1,000 mcg into the muscle every 30 (thirty) days.    [provider]  Dexlansoprazole (DEXILANT) 30 MG capsule Take 30 mg by mouth daily.    [provider]  diclofenac sodium (VOLTAREN) 1 % GEL Apply 4 g topically daily as needed (for pain).    [provider]  diltiazem (CARDIZEM CD) 360 MG 24 hr capsule Take 360 mg by mouth every morning. 12/18/18   [provider]  escitalopram (LEXAPRO) 20 MG tablet Take 20 mg by mouth daily.    [provider]  fluticasone (FLONASE) 50 MCG/ACT nasal spray Place 2 sprays into both nostrils daily as needed for allergies.     [provider]  folic acid (FOLVITE) 1 MG tablet Take 1 mg by mouth daily.  12/15/16   [provider]  furosemide (LASIX) 20 MG tablet Take 40 mg by mouth 2 (two) times daily.     [provider]  gabapentin (NEURONTIN) 400 MG capsule Take 400 mg by mouth 3 (three) times daily.    [provider]  hydroxypropyl methylcellulose / hypromellose (ISOPTO TEARS / GONIOVISC) 2.5 % ophthalmic solution Place 1 drop into both eyes 3 (three) times daily as needed for dry eyes.    [provider]  Iron-Vitamin C 65-125 MG TABS Take 1 tablet by mouth 2 (two) times daily. 09/19/19   Rosey Bath, MD  ketoconazole (NIZORAL) 2 % shampoo Apply 1 application topically every 30 (thirty) days.  10/01/18   [provider]  lactulose (CHRONULAC) 10 GM/15ML solution Take 30 g by mouth daily as needed for mild  constipation.  08/28/18   [provider]  lamoTRIgine (LAMICTAL) 200 MG tablet Take 1 tablet by mouth 2 (two) times a day. 05/20/19   [provider]  linaclotide (LINZESS) 290 MCG CAPS capsule Take 290 mcg by mouth daily before breakfast.    [provider]  magic mouthwash SOLN Take 5 mLs by mouth 3 (three) times daily as needed for mouth pain. 01/17/20   Irean Hong, MD  montelukast (SINGULAIR) 10 MG tablet Take 1 tablet (10 mg total) by mouth at bedtime. 09/23/19 12/22/19  Salena Saner, MD  ondansetron (ZOFRAN ODT) 4 MG disintegrating tablet Take 1 tablet (4 mg total) by mouth every 8 (eight) hours as needed. 01/17/20   Irean Hong, MD  Potassium Chloride ER 20 MEQ TBCR Take 20 mEq by mouth 2 (two) times daily.     [provider]  potassium chloride SA (KLOR-CON) 20 MEQ tablet Take 1 tablet (20 mEq total) by mouth daily.  While taking metolazone 11/18/19   Darylene Price A, FNP  prednisoLONE acetate (PRED FORTE) 1 % ophthalmic suspension Place 1 drop into the right eye 4 (four) times daily.  10/15/19   [provider]  propranolol (INDERAL) 40 MG tablet Take 2 tablets (80 mg total) by mouth 3 (three) times daily. 10/13/18   Salary, Avel Peace, MD  risperiDONE (RISPERDAL) 3 MG tablet Take 3 mg by mouth at bedtime.     [provider]  rivaroxaban (XARELTO) 20 MG TABS tablet Take 20 mg by mouth every morning.    [provider]  rosuvastatin (CRESTOR) 20 MG tablet Take 20 mg by mouth daily.    [provider]  tiZANidine (ZANAFLEX) 4 MG tablet Take 4 mg by mouth 3 (three) times daily.  03/26/17   [provider]  Topiramate ER (TROKENDI XR) 200 MG CP24 Take 1 capsule by mouth daily.     [provider]  vitamin C (VITAMIN C) 250 MG tablet Take 1 tablet (250 mg total) by mouth 2 (two) times daily. 10/13/18   Salary, Avel Peace, MD    Allergies Cortizone-10 [hydrocortisone], Garlic, Prednisone, Contrast media  [iodinated diagnostic agents], and Corticosteroids  Family History  Problem Relation Age of Onset  . Diabetes Mother   . Hypertension Mother   . Asthma Mother   . Gallstones Mother   . Diabetes Father   . Hypertension Father   . Gallstones Father   . Bipolar disorder Sister   . COPD Brother   . Schizophrenia Brother   . COPD Brother   . Hypertension Brother     Social History Social History   Tobacco Use  . Smoking status: Never Smoker  . Smokeless tobacco: Never Used  Substance Use Topics  . Alcohol use: No  . Drug use: No    Review of Systems  Constitutional: Positive for body aches, fever/chills Eyes: No visual changes. ENT: No sore throat. Cardiovascular: Positive for chest pain. Respiratory: Positive for cough and shortness of breath. Gastrointestinal: No abdominal pain.  Positive for nausea and vomiting.  No diarrhea.  No constipation. Genitourinary: Negative for dysuria. Musculoskeletal: Negative for back pain. Skin: Negative for rash. Neurological: Negative for headaches, focal weakness or numbness.   ____________________________________________   PHYSICAL EXAM:  VITAL SIGNS: ED Triage Vitals [01/16/20 2240]  Enc Vitals Group     BP (!) 154/91     Pulse Rate (!) 117     Resp 20     Temp 99.3 F (37.4 C)     Temp Source Oral     SpO2 100 %     Weight (!) 330 lb (149.7 kg)     Height 5\' 10"  (1.778 m)     Head Circumference      Peak Flow      Pain Score 7     Pain Loc      Pain Edu?      Excl. in [place]?     Constitutional: Alert and oriented. Well appearing and in mild acute distress. Eyes: Conjunctivae are normal. PERRL. EOMI. Head: Atraumatic. Nose: Congestion/rhinnorhea. Mouth/Throat: Mucous membranes are moist.   Neck: No stridor.  Supple neck without meningismus. Cardiovascular: Normal rate, regular rhythm. Grossly normal heart sounds.  Good peripheral circulation. Respiratory: Normal respiratory effort.  No retractions. Lungs with  scattered rhonchi. Gastrointestinal: Obese.  Soft and nontender to light or deep palpation. No distention. No abdominal bruits. No CVA tenderness. Musculoskeletal: No lower extremity tenderness nor edema.  No joint effusions. Neurologic:  Normal speech and language. No gross focal neurologic deficits are appreciated.  Skin:  Skin is warm, dry and intact. No rash noted.  No petechiae. Psychiatric: Mood and affect are normal. Speech and behavior are normal.  ____________________________________________   LABS (all labs ordered are listed, but only abnormal results are displayed)  Labs Reviewed  COMPREHENSIVE METABOLIC PANEL - Abnormal; Notable for the following components:      Result Value   Potassium 3.3 (*)    Chloride 97 (*)    Glucose, Bld 118 (*)    AST 14 (*)    Anion gap 16 (*)    All other components within normal limits  URINALYSIS, COMPLETE (UACMP) WITH MICROSCOPIC - Abnormal; Notable for the following components:   Color, Urine YELLOW (*)    APPearance HAZY (*)    Hgb urine dipstick LARGE (*)    RBC / HPF >50 (*)    Bacteria, UA RARE (*)    All other components within normal limits  CULTURE, BLOOD (ROUTINE X 2)  CULTURE, BLOOD (ROUTINE X 2)  URINE CULTURE  SARS CORONAVIRUS 2 (TAT 6-24 HRS)  CBC  LIPASE, BLOOD  LACTIC ACID, PLASMA  LACTIC ACID, PLASMA  TROPONIN I (HIGH SENSITIVITY)  TROPONIN I (HIGH SENSITIVITY)   ____________________________________________  EKG  ED ECG REPORT I, Yousef Huge J, the attending physician, personally viewed and interpreted this ECG.   Date: 01/17/2020  EKG Time: 2239  Rate: 112  Rhythm: sinus tachycardia  Axis: Normal  Intervals:none  ST&T Change: Nonspecific  ____________________________________________  RADIOLOGY  ED MD interpretation: Bibasilar airspace opacities, likely atelectasis  Official radiology report(s): DG Chest 1 View  Result Date: 01/17/2020 CLINICAL DATA:  Cold symptoms. EXAM: CHEST  1 VIEW  COMPARISON:  01/05/2020 FINDINGS: The lung volumes are low. There are hazy bibasilar airspace opacities. There is no pneumothorax or large pleural effusion. The heart size is stable from prior study. There is no acute osseous abnormality. IMPRESSION: Low lung volumes with hazy bibasilar airspace opacities, likely atelectasis. Electronically Signed   By: Katherine Mantle M.D.   On: 01/17/2020 00:50    ____________________________________________   PROCEDURES  Procedure(s) performed (including Critical Care):  Procedures   ____________________________________________   INITIAL IMPRESSION / ASSESSMENT AND PLAN / ED COURSE  As part of my medical decision making, I reviewed the following data within the electronic MEDICAL RECORD NUMBER Nursing notes reviewed and incorporated, Labs reviewed, EKG interpreted, Old chart reviewed, Radiograph reviewed and Notes from prior ED visits     Andrea Miller was evaluated in Emergency Department on 01/17/2020 for the symptoms described in the history of present illness. She was evaluated in the context of the global COVID-19 pandemic, which necessitated consideration that the patient might be at risk for infection with the SARS-CoV-2 virus that causes COVID-19. Institutional protocols and algorithms that pertain to the evaluation of patients at risk for COVID-19 are in a state of rapid change based on information released by regulatory bodies including the CDC and federal and state organizations. These policies and algorithms were followed during the patient's care in the ED.    34 year old female who presents with a 2-week history of cold-like symptoms. Differential includes, but is not limited to, viral syndrome, bronchitis including COPD exacerbation, pneumonia, reactive airway disease including asthma, CHF including exacerbation with or without pulmonary/interstitial edema, pneumothorax, ACS, thoracic trauma, and pulmonary embolism.  Laboratory results  unremarkable.  Chest x-ray demonstrates bibasilar opacities.  Will check blood cultures,  lactic acid.  IV Toradol for body aches, IV Zofran for nausea, Tussionex for cough.  Will reassess.  Clinical Course as of Jan 16 503  Sat Jan 17, 2020  2174 Updated patient on all results.  She slept for a couple hours and is feeling better.  Will place on azithromycin, Tussionex and Magic mouthwash.  Strict return precautions given.  Patient verbalizes understanding and agrees with plan of care.   [JS]    Clinical Course User Index [JS] Irean Hong, MD     ____________________________________________   FINAL CLINICAL IMPRESSION(S) / ED DIAGNOSES  Final diagnoses:  Bronchitis     ED Discharge Orders         Ordered    magic mouthwash SOLN  3 times daily PRN     01/17/20 0358    chlorpheniramine-HYDROcodone (TUSSIONEX PENNKINETIC ER) 10-8 MG/5ML SUER  2 times daily     01/17/20 0358    azithromycin (ZITHROMAX) 250 MG tablet  Daily     01/17/20 0358    ondansetron (ZOFRAN ODT) 4 MG disintegrating tablet  Every 8 hours PRN     01/17/20 0431           Note:  This document was prepared using Dragon voice recognition software and may include unintentional dictation errors.   Irean Hong, MD 01/17/20 [phone]

## 2020-01-17 NOTE — Discharge Instructions (Addendum)
1.  Finish antibiotic as prescribed (Azithromycin 250 mg daily x4 days). 2.  You may take Tussionex as needed for cough. 3.  You may use Magic mouthwash as needed for throat pain. 4.  Return to the ER for worsening symptoms, persistent vomiting, difficulty breathing or other concerns.

## 2020-01-17 NOTE — ED Notes (Signed)
Peripheral IV discontinued. Catheter intact. No signs of infiltration or redness. Gauze applied to IV site.   Discharge instructions reviewed with patient. Questions fielded by this RN. Patient verbalizes understanding of instructions. Patient discharged home in stable condition per [facility]. No acute distress noted at time of discharge.   Pt wheeled to car in parking lot then ambulatory

## 2020-01-17 NOTE — ED Notes (Signed)
Patient reports dry cough that is lasting for several days. States a few days ago she experienced brown mucus. Patient states her has centralized chest pain and a sore throat. SOB with extertion

## 2020-01-18 LAB — URINE CULTURE

## 2020-01-19 DIAGNOSIS — J4550 Severe persistent asthma, uncomplicated: Secondary | ICD-10-CM | POA: Diagnosis not present

## 2020-01-20 ENCOUNTER — Other Ambulatory Visit: Payer: Self-pay

## 2020-01-20 DIAGNOSIS — L918 Other hypertrophic disorders of the skin: Secondary | ICD-10-CM | POA: Diagnosis not present

## 2020-01-20 DIAGNOSIS — R69 Illness, unspecified: Secondary | ICD-10-CM | POA: Diagnosis not present

## 2020-01-20 DIAGNOSIS — L700 Acne vulgaris: Secondary | ICD-10-CM | POA: Diagnosis not present

## 2020-01-20 DIAGNOSIS — J40 Bronchitis, not specified as acute or chronic: Secondary | ICD-10-CM | POA: Diagnosis not present

## 2020-01-20 DIAGNOSIS — L689 Hypertrichosis, unspecified: Secondary | ICD-10-CM | POA: Diagnosis not present

## 2020-01-20 DIAGNOSIS — N920 Excessive and frequent menstruation with regular cycle: Secondary | ICD-10-CM

## 2020-01-20 DIAGNOSIS — B360 Pityriasis versicolor: Secondary | ICD-10-CM | POA: Diagnosis not present

## 2020-01-20 DIAGNOSIS — L209 Atopic dermatitis, unspecified: Secondary | ICD-10-CM | POA: Diagnosis not present

## 2020-01-21 DIAGNOSIS — J4550 Severe persistent asthma, uncomplicated: Secondary | ICD-10-CM | POA: Diagnosis not present

## 2020-01-22 ENCOUNTER — Inpatient Hospital Stay: Payer: Medicare HMO

## 2020-01-22 ENCOUNTER — Inpatient Hospital Stay: Payer: Medicare HMO | Attending: Hematology and Oncology

## 2020-01-22 LAB — CULTURE, BLOOD (ROUTINE X 2)
Culture: NO GROWTH
Culture: NO GROWTH
Special Requests: ADEQUATE

## 2020-01-23 DIAGNOSIS — J4550 Severe persistent asthma, uncomplicated: Secondary | ICD-10-CM | POA: Diagnosis not present

## 2020-01-26 DIAGNOSIS — J4550 Severe persistent asthma, uncomplicated: Secondary | ICD-10-CM | POA: Diagnosis not present

## 2020-01-27 DIAGNOSIS — I509 Heart failure, unspecified: Secondary | ICD-10-CM | POA: Diagnosis not present

## 2020-01-27 DIAGNOSIS — I10 Essential (primary) hypertension: Secondary | ICD-10-CM | POA: Diagnosis not present

## 2020-01-28 DIAGNOSIS — J4550 Severe persistent asthma, uncomplicated: Secondary | ICD-10-CM | POA: Diagnosis not present

## 2020-01-28 DIAGNOSIS — J45909 Unspecified asthma, uncomplicated: Secondary | ICD-10-CM | POA: Diagnosis not present

## 2020-01-28 DIAGNOSIS — G4733 Obstructive sleep apnea (adult) (pediatric): Secondary | ICD-10-CM | POA: Diagnosis not present

## 2020-01-29 DIAGNOSIS — J45909 Unspecified asthma, uncomplicated: Secondary | ICD-10-CM | POA: Diagnosis not present

## 2020-01-30 DIAGNOSIS — J4550 Severe persistent asthma, uncomplicated: Secondary | ICD-10-CM | POA: Diagnosis not present

## 2020-02-02 DIAGNOSIS — J4550 Severe persistent asthma, uncomplicated: Secondary | ICD-10-CM | POA: Diagnosis not present

## 2020-02-04 DIAGNOSIS — J4550 Severe persistent asthma, uncomplicated: Secondary | ICD-10-CM | POA: Diagnosis not present

## 2020-02-06 DIAGNOSIS — J4550 Severe persistent asthma, uncomplicated: Secondary | ICD-10-CM | POA: Diagnosis not present

## 2020-02-09 DIAGNOSIS — J4550 Severe persistent asthma, uncomplicated: Secondary | ICD-10-CM | POA: Diagnosis not present

## 2020-02-10 ENCOUNTER — Inpatient Hospital Stay: Payer: Medicare HMO

## 2020-02-11 DIAGNOSIS — J4550 Severe persistent asthma, uncomplicated: Secondary | ICD-10-CM | POA: Diagnosis not present

## 2020-02-13 DIAGNOSIS — J4550 Severe persistent asthma, uncomplicated: Secondary | ICD-10-CM | POA: Diagnosis not present

## 2020-02-16 DIAGNOSIS — J4550 Severe persistent asthma, uncomplicated: Secondary | ICD-10-CM | POA: Diagnosis not present

## 2020-02-18 DIAGNOSIS — J4550 Severe persistent asthma, uncomplicated: Secondary | ICD-10-CM | POA: Diagnosis not present

## 2020-02-19 ENCOUNTER — Inpatient Hospital Stay: Payer: Medicare HMO | Attending: Hematology and Oncology

## 2020-02-19 ENCOUNTER — Inpatient Hospital Stay: Payer: Medicare HMO

## 2020-02-20 DIAGNOSIS — J4550 Severe persistent asthma, uncomplicated: Secondary | ICD-10-CM | POA: Diagnosis not present

## 2020-02-23 DIAGNOSIS — J4550 Severe persistent asthma, uncomplicated: Secondary | ICD-10-CM | POA: Diagnosis not present

## 2020-02-25 DIAGNOSIS — J4550 Severe persistent asthma, uncomplicated: Secondary | ICD-10-CM | POA: Diagnosis not present

## 2020-02-27 DIAGNOSIS — J4550 Severe persistent asthma, uncomplicated: Secondary | ICD-10-CM | POA: Diagnosis not present

## 2020-02-28 DIAGNOSIS — J45909 Unspecified asthma, uncomplicated: Secondary | ICD-10-CM | POA: Diagnosis not present

## 2020-02-28 DIAGNOSIS — G4733 Obstructive sleep apnea (adult) (pediatric): Secondary | ICD-10-CM | POA: Diagnosis not present

## 2020-02-29 DIAGNOSIS — J45909 Unspecified asthma, uncomplicated: Secondary | ICD-10-CM | POA: Diagnosis not present

## 2020-03-04 DIAGNOSIS — I10 Essential (primary) hypertension: Secondary | ICD-10-CM | POA: Diagnosis not present

## 2020-03-04 DIAGNOSIS — R69 Illness, unspecified: Secondary | ICD-10-CM | POA: Diagnosis not present

## 2020-03-05 DIAGNOSIS — R69 Illness, unspecified: Secondary | ICD-10-CM | POA: Diagnosis not present

## 2020-03-08 ENCOUNTER — Other Ambulatory Visit: Payer: Medicare Other

## 2020-03-08 DIAGNOSIS — J4550 Severe persistent asthma, uncomplicated: Secondary | ICD-10-CM | POA: Diagnosis not present

## 2020-03-09 ENCOUNTER — Inpatient Hospital Stay: Payer: Medicare HMO

## 2020-03-09 ENCOUNTER — Other Ambulatory Visit: Payer: Medicare Other

## 2020-03-10 ENCOUNTER — Inpatient Hospital Stay: Payer: Medicare HMO

## 2020-03-10 ENCOUNTER — Ambulatory Visit: Payer: Medicare Other

## 2020-03-10 ENCOUNTER — Ambulatory Visit: Payer: Medicare Other | Admitting: Hematology and Oncology

## 2020-03-10 DIAGNOSIS — J4550 Severe persistent asthma, uncomplicated: Secondary | ICD-10-CM | POA: Diagnosis not present

## 2020-03-12 ENCOUNTER — Emergency Department (HOSPITAL_COMMUNITY)
Admission: EM | Admit: 2020-03-12 | Discharge: 2020-03-12 | Disposition: A | Discharge status: Home / Self Care | Payer: Medicare HMO | Attending: Emergency Medicine | Admitting: Emergency Medicine

## 2020-03-12 ENCOUNTER — Encounter (HOSPITAL_COMMUNITY): Payer: Self-pay | Admitting: *Deleted

## 2020-03-12 ENCOUNTER — Emergency Department (HOSPITAL_COMMUNITY): Payer: Medicare HMO

## 2020-03-12 DIAGNOSIS — J45909 Unspecified asthma, uncomplicated: Secondary | ICD-10-CM | POA: Diagnosis not present

## 2020-03-12 DIAGNOSIS — I110 Hypertensive heart disease with heart failure: Secondary | ICD-10-CM | POA: Diagnosis not present

## 2020-03-12 DIAGNOSIS — R0789 Other chest pain: Secondary | ICD-10-CM | POA: Insufficient documentation

## 2020-03-12 DIAGNOSIS — R0602 Shortness of breath: Secondary | ICD-10-CM

## 2020-03-12 DIAGNOSIS — Z79899 Other long term (current) drug therapy: Secondary | ICD-10-CM | POA: Insufficient documentation

## 2020-03-12 DIAGNOSIS — E119 Type 2 diabetes mellitus without complications: Secondary | ICD-10-CM | POA: Diagnosis not present

## 2020-03-12 DIAGNOSIS — Z7901 Long term (current) use of anticoagulants: Secondary | ICD-10-CM | POA: Insufficient documentation

## 2020-03-12 DIAGNOSIS — R42 Dizziness and giddiness: Secondary | ICD-10-CM | POA: Diagnosis not present

## 2020-03-12 DIAGNOSIS — I509 Heart failure, unspecified: Secondary | ICD-10-CM | POA: Diagnosis not present

## 2020-03-12 DIAGNOSIS — M4716 Other spondylosis with myelopathy, lumbar region: Secondary | ICD-10-CM | POA: Diagnosis not present

## 2020-03-12 DIAGNOSIS — J4550 Severe persistent asthma, uncomplicated: Secondary | ICD-10-CM | POA: Diagnosis not present

## 2020-03-12 DIAGNOSIS — M5416 Radiculopathy, lumbar region: Secondary | ICD-10-CM | POA: Diagnosis not present

## 2020-03-12 DIAGNOSIS — G894 Chronic pain syndrome: Secondary | ICD-10-CM | POA: Diagnosis not present

## 2020-03-12 DIAGNOSIS — M5126 Other intervertebral disc displacement, lumbar region: Secondary | ICD-10-CM | POA: Diagnosis not present

## 2020-03-12 DIAGNOSIS — R079 Chest pain, unspecified: Secondary | ICD-10-CM | POA: Diagnosis not present

## 2020-03-12 LAB — D-DIMER, QUANTITATIVE: D-Dimer, Quant: 0.4 ug/mL-FEU (ref 0.00–0.50)

## 2020-03-12 LAB — CBC WITH DIFFERENTIAL/PLATELET
Abs Immature Granulocytes: 0.01 10*3/uL (ref 0.00–0.07)
Basophils Absolute: 0 10*3/uL (ref 0.0–0.1)
Basophils Relative: 0 %
Eosinophils Absolute: 0 10*3/uL (ref 0.0–0.5)
Eosinophils Relative: 1 %
HCT: 41.8 % (ref 36.0–46.0)
Hemoglobin: 12.7 g/dL (ref 12.0–15.0)
Immature Granulocytes: 0 %
Lymphocytes Relative: 40 %
Lymphs Abs: 1.9 10*3/uL (ref 0.7–4.0)
MCH: 26.1 pg (ref 26.0–34.0)
MCHC: 30.4 g/dL (ref 30.0–36.0)
MCV: 86 fL (ref 80.0–100.0)
Monocytes Absolute: 0.4 10*3/uL (ref 0.1–1.0)
Monocytes Relative: 9 %
Neutro Abs: 2.4 10*3/uL (ref 1.7–7.7)
Neutrophils Relative %: 50 %
Platelets: 273 10*3/uL (ref 150–400)
RBC: 4.86 MIL/uL (ref 3.87–5.11)
RDW: 14.6 % (ref 11.5–15.5)
WBC: 4.8 10*3/uL (ref 4.0–10.5)
nRBC: 0 % (ref 0.0–0.2)

## 2020-03-12 LAB — BASIC METABOLIC PANEL
Anion gap: 7 (ref 5–15)
BUN: 8 mg/dL (ref 6–20)
CO2: 28 mmol/L (ref 22–32)
Calcium: 9.4 mg/dL (ref 8.9–10.3)
Chloride: 103 mmol/L (ref 98–111)
Creatinine, Ser: 0.72 mg/dL (ref 0.44–1.00)
GFR calc Af Amer: >60 mL/min (ref >60)
GFR calc non Af Amer: >60 mL/min (ref >60)
Glucose, Bld: 89 mg/dL (ref 70–99)
Potassium: 3.9 mmol/L (ref 3.5–5.1)
Sodium: 138 mmol/L (ref 135–145)

## 2020-03-12 LAB — PREGNANCY, URINE: Preg Test, Ur: NEGATIVE

## 2020-03-12 LAB — TROPONIN I (HIGH SENSITIVITY)
Troponin I (High Sensitivity): <2 ng/L (ref <18)
Troponin I (High Sensitivity): <2 ng/L (ref <18)

## 2020-03-12 LAB — BRAIN NATRIURETIC PEPTIDE: B Natriuretic Peptide: 18 pg/mL (ref 0.0–100.0)

## 2020-03-12 MED ORDER — ONDANSETRON HCL 4 MG/2ML IJ SOLN
4.0000 mg | Freq: Once | INTRAMUSCULAR | Status: AC
  Administered 2020-03-12: 17:00:00 4 mg via INTRAVENOUS
  Filled 2020-03-12: qty 2

## 2020-03-12 MED ORDER — KETOROLAC TROMETHAMINE 30 MG/ML IJ SOLN
30.0000 mg | Freq: Once | INTRAMUSCULAR | Status: AC
  Administered 2020-03-12: 16:00:00 30 mg via INTRAVENOUS
  Filled 2020-03-12: qty 1

## 2020-03-12 MED ORDER — METHOCARBAMOL 500 MG PO TABS: 500.0000 mg | ORAL_TABLET | Freq: Two times a day (BID) | ORAL | 0 refills | Status: DC

## 2020-03-12 MED ORDER — SODIUM CHLORIDE 0.9 % IV BOLUS
500.0000 mL | Freq: Once | INTRAVENOUS | Status: AC
  Administered 2020-03-12: 500 mL via INTRAVENOUS

## 2020-03-12 MED ORDER — ONDANSETRON HCL 4 MG/2ML IJ SOLN
4.0000 mg | Freq: Once | INTRAMUSCULAR | Status: AC
  Administered 2020-03-12: 15:00:00 4 mg via INTRAVENOUS
  Filled 2020-03-12: qty 2

## 2020-03-12 MED ORDER — ONDANSETRON HCL 4 MG/2ML IJ SOLN
4.0000 mg | Freq: Once | INTRAMUSCULAR | Status: AC
  Administered 2020-03-12: 4 mg via INTRAVENOUS
  Filled 2020-03-12: qty 2

## 2020-03-12 MED ORDER — DICLOFENAC SODIUM 1 % EX GEL: 2.0000 g | Freq: Four times a day (QID) | CUTANEOUS | 0 refills | Status: DC

## 2020-03-12 MED ORDER — ALBUTEROL SULFATE HFA 108 (90 BASE) MCG/ACT IN AERS
2.0000 | INHALATION_SPRAY | Freq: Once | RESPIRATORY_TRACT | Status: AC
  Administered 2020-03-12: 18:00:00 2 via RESPIRATORY_TRACT
  Filled 2020-03-12: qty 6.7

## 2020-03-12 MED ORDER — MORPHINE SULFATE (PF) 4 MG/ML IV SOLN
4.0000 mg | Freq: Once | INTRAVENOUS | Status: AC
  Administered 2020-03-12: 4 mg via INTRAVENOUS
  Filled 2020-03-12: qty 1

## 2020-03-12 NOTE — ED Provider Notes (Signed)
[facility] EMERGENCY DEPARTMENT Provider Note   CSN: [id number] Arrival date & time: 03/12/20  1318     History Chief Complaint  Patient presents with  . Shortness of Breath  . Dizziness    Andrea Miller is a 34 y.o. female.  Andrea Miller is a 34 y.o. female with a history of fibromyalgia, hypertension, CHF, SVT, pulmonary embolism, seizures, anxiety, depression, bipolar disorder, who presents to the emergency department for evaluation of chest pain, shortness of breath and lightheadedness. Pt received her 1st COVID vaccine 2 weeks ago, during observation after vaccination she experienced some lightheadedness and CP, these symptoms have continued and worsened over the past 4 days. CP is described as a pressure in the center that radiates out through her chest, pt reports she sometimes has some pain in her left arm but isn't sure if this is new or related to fibromyalgia.  Patient states she has some associated shortness of breath and pain is sometimes pleuritic, pain is also worse with movement.  Denies abdominal pain, nausea or vomiting.  Denies any fevers, cough or sick contacts.  She is also noted some swelling in the armpit of her left arm, received her Covid vaccination in the left arm.  Has not noticed swelling or bumps elsewhere.  Has a history of PE, and is on Xarelto, she ran out of this for about a month but her hematologist has represcribed it and she has been back on the medication and taking it regularly for a month now, and has been on it the whole time she has had the symptoms.        Past Medical History:  Diagnosis Date  . Anxiety   . Asthma   . Bipolar disorder (HCC)   . Chest pain    and angina  . CHF (congestive heart failure) (HCC)   . Depression   . Fibromyalgia   . Hypertension   . 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 Obesity   . Pre-diabetes   . Pulmonary embolus (HCC)   . Seizures (HCC)    secondary to  anxiety  last seizure 01/25/18  . SVT (supraventricular tachycardia) (HCC)    with hx of syncope; managed by [facility]    Patient Active Problem List   Diagnosis Date Noted  . Chronic anticoagulation 05/07/2019  . Iron deficiency anemia due to chronic blood loss 10/15/2018  . B12 deficiency 10/13/2018  . Symptomatic anemia 10/11/2018  . Multiple subsegmental pulmonary emboli without acute cor pulmonale (HCC) 09/15/2018  . Morbid obesity with BMI of 50.0-59.9, adult (HCC) 09/15/2018  . Chronic diastolic heart failure (HCC) 07/01/2018  . Lymphedema 07/01/2018  . Chronic cholecystitis   . Edema 02/13/2018  . Diabetes mellitus type 2, uncomplicated (HCC) 02/13/2018  . Hypertension 11/23/2017  . Asthma exacerbation 04/14/2017  . Asthma without status asthmaticus 01/16/2017  . Bipolar affective (HCC) 01/16/2017  . Coronary artery disease 01/16/2017  . Syncope 12/12/2016  . Fibromyalgia 04/27/2014  . OSA (obstructive sleep apnea) 04/27/2014  . Seizure-like activity (HCC) 04/27/2014  . Migraine without status migrainosus, not intractable 04/27/2014  . Obesity 04/15/2014  . Sleep disorder 04/15/2014  . Neck pain 04/15/2014  . Headache 04/15/2014  . Restless legs syndrome 04/15/2014  . Supraventricular tachycardia (HCC) 07/13/2009    Past Surgical History:  Procedure Laterality Date  . CHOLECYSTECTOMY N/A 02/22/2018   Procedure: LAPAROSCOPIC CHOLECYSTECTOMY;  Surgeon: Ancil Linsey, MD;  Location: [facility] ORS;  Service:  General;  Laterality: N/A;  . TONSILLECTOMY AND ADENOIDECTOMY N/A 09/05/2016   Procedure: TONSILLECTOMY AND ADENOIDECTOMY;  Surgeon: Linus Salmons, MD;  Location: [facility] ORS;  Service: ENT;  Laterality: N/A;     OB History    Gravida  0   Para  0   Term  0   Preterm  0   AB  0   Living  0     SAB  0   TAB  0   Ectopic  0   Multiple  0   Live Births              Family History  Problem Relation Age of Onset  . Diabetes Mother   .  Hypertension Mother   . Asthma Mother   . Gallstones Mother   . Diabetes Father   . Hypertension Father   . Gallstones Father   . Bipolar disorder Sister   . COPD Brother   . Schizophrenia Brother   . COPD Brother   . Hypertension Brother     Social History   Tobacco Use  . Smoking status: Never Smoker  . Smokeless tobacco: Never Used  Substance Use Topics  . Alcohol use: No  . Drug use: No    Home Medications Prior to Admission medications   Medication Sig Start Date End Date Taking? Authorizing Provider  Adapalene 0.3 % gel Apply 1 application topically at bedtime.  10/20/19  Yes [provider]  albuterol (PROVENTIL) (2.5 MG/3ML) 0.083% nebulizer solution Take 3 mLs (2.5 mg total) by nebulization every 4 (four) hours as needed for wheezing or shortness of breath. 09/23/19 11/27/19  Salena Saner, MD  Albuterol Sulfate 108 (90 Base) MCG/ACT AEPB Inhale 2 puffs into the lungs every 6 (six) hours as needed. 09/23/19 11/27/19  Salena Saner, MD  azithromycin (ZITHROMAX) 250 MG tablet Take 1 tablet (250 mg total) by mouth daily. 01/17/20   Irean Hong, MD  budesonide-formoterol [facility]) 160-4.5 MCG/ACT inhaler Inhale 2 puffs into the lungs 2 (two) times daily. 09/23/19 11/27/19  Salena Saner, MD  busPIRone (BUSPAR) 10 MG tablet Take 10 mg by mouth 2 (two) times daily.  10/22/19   [provider]  Calcium-Magnesium 500-250 MG TABS Take 1 tablet by mouth 2 (two) times daily.     [provider]  chlorpheniramine-HYDROcodone (TUSSIONEX PENNKINETIC ER) 10-8 MG/5ML SUER Take 5 mLs by mouth 2 (two) times daily. 01/17/20   Irean Hong, MD  clonazePAM (KLONOPIN) 1 MG tablet Take 1 tablet by mouth 2 (two) times a day. 04/02/19   [provider]  cyanocobalamin (,VITAMIN B-12,) 1000 MCG/ML injection Inject 1,000 mcg into the muscle every 30 (thirty) days.    [provider]  Dexlansoprazole (DEXILANT) 30 MG capsule Take 30 mg by  mouth daily.    [provider]  diclofenac sodium (VOLTAREN) 1 % GEL Apply 4 g topically daily as needed (for pain).    [provider]  diltiazem (CARDIZEM CD) 360 MG 24 hr capsule Take 360 mg by mouth every morning. 12/18/18   [provider]  escitalopram (LEXAPRO) 20 MG tablet Take 20 mg by mouth daily.    [provider]  fluticasone (FLONASE) 50 MCG/ACT nasal spray Place 2 sprays into both nostrils daily as needed for allergies.     [provider]  folic acid (FOLVITE) 1 MG tablet Take 1 mg by mouth daily.  12/15/16   [provider]  furosemide (LASIX) 20  MG tablet Take 40 mg by mouth 2 (two) times daily.     [provider]  gabapentin (NEURONTIN) 400 MG capsule Take 400 mg by mouth 3 (three) times daily.    [provider]  hydroxypropyl methylcellulose / hypromellose (ISOPTO TEARS / GONIOVISC) 2.5 % ophthalmic solution Place 1 drop into both eyes 3 (three) times daily as needed for dry eyes.    [provider]  Iron-Vitamin C 65-125 MG TABS Take 1 tablet by mouth 2 (two) times daily. 09/19/19   Rosey Bath, MD  ketoconazole (NIZORAL) 2 % shampoo Apply 1 application topically every 30 (thirty) days.  10/01/18   [provider]  lactulose (CHRONULAC) 10 GM/15ML solution Take 30 g by mouth daily as needed for mild constipation.  08/28/18   [provider]  lamoTRIgine (LAMICTAL) 200 MG tablet Take 1 tablet by mouth 2 (two) times a day. 05/20/19   [provider]  linaclotide (LINZESS) 290 MCG CAPS capsule Take 290 mcg by mouth daily before breakfast.    [provider]  magic mouthwash SOLN Take 5 mLs by mouth 3 (three) times daily as needed for mouth pain. 01/17/20   Irean Hong, MD  montelukast (SINGULAIR) 10 MG tablet Take 1 tablet (10 mg total) by mouth at bedtime. 09/23/19 12/22/19  Salena Saner, MD  ondansetron (ZOFRAN ODT) 4 MG disintegrating tablet Take 1  tablet (4 mg total) by mouth every 8 (eight) hours as needed. 01/17/20   Irean Hong, MD  Potassium Chloride ER 20 MEQ TBCR Take 20 mEq by mouth 2 (two) times daily.     [provider]  potassium chloride SA (KLOR-CON) 20 MEQ tablet Take 1 tablet (20 mEq total) by mouth daily. While taking metolazone 11/18/19   Clarisa Kindred A, FNP  prednisoLONE acetate (PRED FORTE) 1 % ophthalmic suspension Place 1 drop into the right eye 4 (four) times daily.  10/15/19   [provider]  propranolol (INDERAL) 40 MG tablet Take 2 tablets (80 mg total) by mouth 3 (three) times daily. 10/13/18   Salary, Evelena Asa, MD  risperiDONE (RISPERDAL) 3 MG tablet Take 3 mg by mouth at bedtime.     [provider]  rivaroxaban (XARELTO) 20 MG TABS tablet Take 20 mg by mouth every morning.    [provider]  rosuvastatin (CRESTOR) 20 MG tablet Take 20 mg by mouth daily.    [provider]  tiZANidine (ZANAFLEX) 4 MG tablet Take 4 mg by mouth 3 (three) times daily.  03/26/17   [provider]  Topiramate ER (TROKENDI XR) 200 MG CP24 Take 1 capsule by mouth daily.     [provider]  vitamin C (VITAMIN C) 250 MG tablet Take 1 tablet (250 mg total) by mouth 2 (two) times daily. 10/13/18   Salary, Evelena Asa, MD    Allergies    Cortizone-10 [hydrocortisone], Garlic, Prednisone, Contrast media [iodinated diagnostic agents], and Corticosteroids  Review of Systems   Review of Systems  Constitutional: Negative for chills and fever.  HENT: Negative.   Respiratory: Positive for shortness of breath. Negative for cough and wheezing.   Cardiovascular: Positive for chest pain. Negative for leg swelling.  Gastrointestinal: Negative for abdominal pain, nausea and vomiting.  Genitourinary: Negative for dysuria and frequency.  Musculoskeletal: Negative for arthralgias and myalgias.  Skin: Negative for color change and rash.  Neurological: Positive for light-headedness.  Negative for syncope.    Physical Exam Updated Vital Signs  BP 126/71 (BP Location: Right Arm)   Pulse 97   Temp 98.8 F (37.1 C) (Oral)   Ht  (1.778 m)   SpO2 100%   BMI 47.35 kg/m   Physical Exam Vitals and nursing note reviewed.  Constitutional:      General: She is not in acute distress.    Appearance: She is well-developed. She is obese. She is not ill-appearing or diaphoretic.     Comments: Well-appearing and in no distress  HENT:     Head: Normocephalic and atraumatic.  Eyes:     General:        Right eye: No discharge.        Left eye: No discharge.     Pupils: Pupils are equal, round, and reactive to light.  Cardiovascular:     Rate and Rhythm: Normal rate and regular rhythm.     Pulses: Normal pulses.     Heart sounds: Normal heart sounds. No murmur. No friction rub. No gallop.   Pulmonary:     Effort: Pulmonary effort is normal. No respiratory distress.     Breath sounds: Normal breath sounds. No wheezing or rales.     Comments: Respirations equal and unlabored, patient able to speak in full sentences, lungs clear to auscultation bilaterally Chest:     Chest wall: Tenderness present.     Comments: Chest wall is tender to palpation this reproduces the discomfort patient has Abdominal:     General: Bowel sounds are normal. There is no distension.     Palpations: Abdomen is soft. There is no mass.     Tenderness: There is no abdominal tenderness. There is no guarding.     Comments: Abdomen soft, nondistended, nontender to palpation in all quadrants without guarding or peritoneal signs  Musculoskeletal:        General: No deformity.     Cervical back: Neck supple.     Comments: Trace edema noted in bilateral lower extremities  Skin:    General: Skin is warm and dry.     Capillary Refill: Capillary refill takes less than 2 seconds.  Neurological:     Mental Status: She is alert.     Coordination: Coordination normal.     Comments: Speech is clear, able  to follow commands Moves extremities without ataxia, coordination intact  Psychiatric:        Mood and Affect: Mood normal.        Behavior: Behavior normal.     ED Results / Procedures / Treatments   Labs (all labs ordered are listed, but only abnormal results are displayed) Labs Reviewed  BASIC METABOLIC PANEL  BRAIN NATRIURETIC PEPTIDE  CBC WITH DIFFERENTIAL/PLATELET  D-DIMER, QUANTITATIVE (NOT AT [facility])  PREGNANCY, URINE  TROPONIN I (HIGH SENSITIVITY)  TROPONIN I (HIGH SENSITIVITY)    EKG None  Radiology DG Chest Port 1 View  Result Date: 03/12/2020 CLINICAL DATA:  Shortness of breath and chest pain EXAM: PORTABLE CHEST 1 VIEW COMPARISON:  January 17, 2020 FINDINGS: Shallow degree of inspiration. Lungs clear. Heart mildly enlarged with pulmonary vascularity normal. No adenopathy. No pneumothorax. No bone lesions. IMPRESSION: Shallow degree of inspiration. Lungs clear. Stable cardiac prominence. Electronically Signed   By: Bretta Bang III M.D.   On: 03/12/2020 15:18    Procedures Procedures (including critical care time)  Medications Ordered in ED Medications  morphine 4 MG/ML injection 4 mg (4 mg Intravenous Given 03/12/20 1451)  ondansetron (ZOFRAN) injection 4 mg (4 mg Intravenous Given 03/12/20  1451)  sodium chloride 0.9 % bolus 500 mL (0 mLs Intravenous Stopped 03/12/20 1712)  ketorolac (TORADOL) 30 MG/ML injection 30 mg (30 mg Intravenous Given 03/12/20 1622)  ondansetron (ZOFRAN) injection 4 mg (4 mg Intravenous Given 03/12/20 1712)    ED Course  I have reviewed the triage vital signs and the nursing notes.  Pertinent labs & imaging results that were available during my care of the patient were reviewed by me and considered in my medical decision making (see chart for details).    MDM Rules/Calculators/A&P                      34 year old female with extensive chronic medical history who presents to the emergency department for shortness of breath and  chest pressure present over the past 2 weeks after receiving her first Covid vaccine.  On arrival she has normal vitals and is in no acute distress although states she feels she is having difficulty breathing.  She is not tachypneic and has O2 saturations of 98-100%.  No tachycardia noted.  She does have a history of pulmonary embolism, and is currently on Xarelto, she had a 1 month gap in her medication but has been back on her medication for 1 month and the entire period that she has been having the symptoms.  She is noted some lymphadenopathy in her left axilla and she received her vaccine injection in the left arm, no other lymphadenopathy noted.  Patient also has a history of CHF, she has trace edema of the lower extremities, her lungs are clear without crackles noted.  Will get basic labs, troponin, BNP, given that patient has a contrast allergy and has been taking her Xarelto regularly feel that PE is less likely, will get D-dimer, EKG and chest x-ray ordered as well.  Dose of pain medicine given and small bolus of fluids.  Lab work and imaging ordered, reviewed and interpreted by myself. CBC: No leukocytosis and normal hemoglobin. BMP: No electrolyte derangements, normal renal function. Troponin: Negative x2 BNP: 18.0, not at all elevated. D-dimer: 0.40, not elevated fortunately, low concern for PE.  No clinical evidence of DVT.  EKG shows normal sinus rhythm without ischemic changes, some possible left atrial enlargement.  Chest x-ray reviewed by myself, shallow degree of inspiration, lungs clear, with stable cardiac prominence, no pulmonary edema or infiltrate noted.  Patient ambulated in the emergency department and maintained normal O2 saturations without tachypnea or tachycardia.  Discussed very reassuring work-up with the patient.  She was recently treated for bronchitis and recently had her Covid vaccine, chest pain is worsened with palpation and I question whether she could have  costochondritis.  NSAID dose given here in the ED and will treat outpatient with Voltaren as this will be safer with her Xarelto. We'll also treat with Robaxin. Patient also explained that she has been unable to get her inhaler, she was given an albuterol inhaler here, as she felt it may help with her chest tightness, although she did not exhibit wheezing here today. She has recently been seen and evaluated for bronchitis. I stressed to patient the importance of close PCP follow-up and return precautions discussed. She expressed understanding and agreement with plan. Discharged home in good condition.  Final Clinical Impression(s) / ED Diagnoses Final diagnoses:  Atypical chest pain  Shortness of breath    Rx / DC Orders ED Discharge Orders         Ordered    diclofenac Sodium (VOLTAREN)  1 % GEL  4 times daily     03/12/20 1813    methocarbamol (ROBAXIN) 500 MG tablet  2 times daily     03/12/20 1813           Janet Berlin 03/12/20 Lewis Moccasin, MD 03/13/20 1239

## 2020-03-12 NOTE — ED Triage Notes (Signed)
C/o shortness of breath for over a week, after taking a covid shot, has a history of blood clots in her lungs, also c/o pain in left arm. cfoncerned she may have blood clots and c/o tightness in chest

## 2020-03-12 NOTE — ED Notes (Signed)
Ambulated patient in hallway.  Patient's o2 sat remained at 100%.  Pulse rate rate went from 94 resting to 99 walking. Patient did not show any signs of distress.

## 2020-03-12 NOTE — Discharge Instructions (Addendum)
Your lab work today has been reassuring and does not show evidence of a blood clot, stress on your heart or worsening of your heart failure.  Your chest x-ray is clear and does not show any evidence of pneumonia or fluid on your lungs.  I suspect that this may be due to inflammation in the cartilage around your ribs and breastbone.  Please use Voltaren gel as prescribed.  You can also use muscle relaxer as prescribed.  Please also use albuterol inhaler as needed for chest tightness.  Please follow-up closely with your primary care doctor.  Return to the emergency department for new or worsening symptoms.
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
## 2020-03-17 ENCOUNTER — Other Ambulatory Visit: Payer: Self-pay

## 2020-03-17 ENCOUNTER — Inpatient Hospital Stay: Payer: Medicare HMO | Attending: Hematology and Oncology

## 2020-03-17 ENCOUNTER — Other Ambulatory Visit: Payer: Self-pay | Admitting: *Deleted

## 2020-03-17 ENCOUNTER — Inpatient Hospital Stay: Payer: Medicare HMO

## 2020-03-17 DIAGNOSIS — Z818 Family history of other mental and behavioral disorders: Secondary | ICD-10-CM | POA: Insufficient documentation

## 2020-03-17 DIAGNOSIS — N920 Excessive and frequent menstruation with regular cycle: Secondary | ICD-10-CM

## 2020-03-17 DIAGNOSIS — R079 Chest pain, unspecified: Secondary | ICD-10-CM | POA: Diagnosis not present

## 2020-03-17 DIAGNOSIS — D500 Iron deficiency anemia secondary to blood loss (chronic): Secondary | ICD-10-CM | POA: Insufficient documentation

## 2020-03-17 DIAGNOSIS — R519 Headache, unspecified: Secondary | ICD-10-CM | POA: Diagnosis not present

## 2020-03-17 DIAGNOSIS — Z8249 Family history of ischemic heart disease and other diseases of the circulatory system: Secondary | ICD-10-CM | POA: Insufficient documentation

## 2020-03-17 DIAGNOSIS — H538 Other visual disturbances: Secondary | ICD-10-CM | POA: Diagnosis not present

## 2020-03-17 DIAGNOSIS — Z833 Family history of diabetes mellitus: Secondary | ICD-10-CM | POA: Insufficient documentation

## 2020-03-17 DIAGNOSIS — Z79899 Other long term (current) drug therapy: Secondary | ICD-10-CM | POA: Diagnosis not present

## 2020-03-17 DIAGNOSIS — Z836 Family history of other diseases of the respiratory system: Secondary | ICD-10-CM | POA: Diagnosis not present

## 2020-03-17 DIAGNOSIS — E538 Deficiency of other specified B group vitamins: Secondary | ICD-10-CM | POA: Insufficient documentation

## 2020-03-17 DIAGNOSIS — R0602 Shortness of breath: Secondary | ICD-10-CM | POA: Insufficient documentation

## 2020-03-17 LAB — CBC
HCT: 40.4 % (ref 36.0–46.0)
Hemoglobin: 12.6 g/dL (ref 12.0–15.0)
MCH: 26.4 pg (ref 26.0–34.0)
MCHC: 31.2 g/dL (ref 30.0–36.0)
MCV: 84.7 fL (ref 80.0–100.0)
Platelets: 247 10*3/uL (ref 150–400)
RBC: 4.77 MIL/uL (ref 3.87–5.11)
RDW: 14.6 % (ref 11.5–15.5)
WBC: 5.4 10*3/uL (ref 4.0–10.5)
nRBC: 0 % (ref 0.0–0.2)

## 2020-03-17 LAB — FERRITIN: Ferritin: 23 ng/mL (ref 11–307)

## 2020-03-17 MED ORDER — CYANOCOBALAMIN 1000 MCG/ML IJ SOLN
1000.0000 ug | Freq: Once | INTRAMUSCULAR | Status: AC
  Administered 2020-03-17: 1000 ug via INTRAMUSCULAR
  Filled 2020-03-17: qty 1

## 2020-03-18 ENCOUNTER — Inpatient Hospital Stay: Payer: Medicare HMO

## 2020-03-18 ENCOUNTER — Other Ambulatory Visit: Payer: Self-pay

## 2020-03-18 ENCOUNTER — Encounter: Payer: Self-pay | Admitting: Hematology and Oncology

## 2020-03-18 ENCOUNTER — Inpatient Hospital Stay (HOSPITAL_BASED_OUTPATIENT_CLINIC_OR_DEPARTMENT_OTHER): Payer: Medicare HMO | Admitting: Hematology and Oncology

## 2020-03-18 DIAGNOSIS — Z7901 Long term (current) use of anticoagulants: Secondary | ICD-10-CM

## 2020-03-18 DIAGNOSIS — E538 Deficiency of other specified B group vitamins: Secondary | ICD-10-CM | POA: Diagnosis not present

## 2020-03-18 DIAGNOSIS — D500 Iron deficiency anemia secondary to blood loss (chronic): Secondary | ICD-10-CM

## 2020-03-18 DIAGNOSIS — I2694 Multiple subsegmental pulmonary emboli without acute cor pulmonale: Secondary | ICD-10-CM

## 2020-03-18 NOTE — Progress Notes (Signed)
Confirmed Name and DOB. Patient reports she has had Chest Pressure, Fatigue, and Slight SOB x 3 weeks. Patient was evaluated in ED on Friday for this and everything was negative. States she has an appt. Next week for this as well.

## 2020-03-19 ENCOUNTER — Ambulatory Visit: Payer: Medicare HMO

## 2020-03-19 ENCOUNTER — Other Ambulatory Visit: Payer: Self-pay

## 2020-03-19 ENCOUNTER — Other Ambulatory Visit: Payer: Self-pay | Admitting: Hematology and Oncology

## 2020-03-19 ENCOUNTER — Inpatient Hospital Stay: Payer: Medicare HMO

## 2020-03-19 VITALS — BP 141/87 | HR 90 | Temp 97.1°F

## 2020-03-19 DIAGNOSIS — I2694 Multiple subsegmental pulmonary emboli without acute cor pulmonale: Secondary | ICD-10-CM

## 2020-03-19 DIAGNOSIS — E538 Deficiency of other specified B group vitamins: Secondary | ICD-10-CM | POA: Diagnosis not present

## 2020-03-19 DIAGNOSIS — R079 Chest pain, unspecified: Secondary | ICD-10-CM | POA: Diagnosis not present

## 2020-03-19 DIAGNOSIS — D500 Iron deficiency anemia secondary to blood loss (chronic): Secondary | ICD-10-CM | POA: Diagnosis not present

## 2020-03-19 DIAGNOSIS — Z79899 Other long term (current) drug therapy: Secondary | ICD-10-CM | POA: Diagnosis not present

## 2020-03-19 DIAGNOSIS — R0602 Shortness of breath: Secondary | ICD-10-CM | POA: Diagnosis not present

## 2020-03-19 DIAGNOSIS — Z8249 Family history of ischemic heart disease and other diseases of the circulatory system: Secondary | ICD-10-CM | POA: Diagnosis not present

## 2020-03-19 DIAGNOSIS — N920 Excessive and frequent menstruation with regular cycle: Secondary | ICD-10-CM | POA: Diagnosis not present

## 2020-03-19 DIAGNOSIS — H538 Other visual disturbances: Secondary | ICD-10-CM | POA: Diagnosis not present

## 2020-03-19 DIAGNOSIS — R519 Headache, unspecified: Secondary | ICD-10-CM | POA: Diagnosis not present

## 2020-03-19 DIAGNOSIS — Z833 Family history of diabetes mellitus: Secondary | ICD-10-CM | POA: Diagnosis not present

## 2020-03-19 LAB — PREGNANCY, URINE: Preg Test, Ur: NEGATIVE — AB

## 2020-03-19 MED ORDER — IRON SUCROSE 20 MG/ML IV SOLN
200.0000 mg | Freq: Once | INTRAVENOUS | Status: AC
  Administered 2020-03-19: 200 mg via INTRAVENOUS
  Filled 2020-03-19: qty 10

## 2020-03-19 MED ORDER — SODIUM CHLORIDE 0.9 % IV SOLN
Freq: Once | INTRAVENOUS | Status: AC
  Filled 2020-03-19: qty 250

## 2020-03-19 MED ORDER — SODIUM CHLORIDE 0.9 % IV SOLN: 200.0000 mg | Freq: Once | INTRAVENOUS | Status: DC

## 2020-03-23 ENCOUNTER — Other Ambulatory Visit: Payer: Self-pay | Admitting: Ophthalmology

## 2020-03-23 DIAGNOSIS — J45909 Unspecified asthma, uncomplicated: Secondary | ICD-10-CM

## 2020-03-23 DIAGNOSIS — H3023 Posterior cyclitis, bilateral: Secondary | ICD-10-CM | POA: Diagnosis not present

## 2020-03-23 DIAGNOSIS — H209 Unspecified iridocyclitis: Secondary | ICD-10-CM

## 2020-03-23 NOTE — Progress Notes (Deleted)
Subjective:    Patient ID: Andrea Miller, female    DOB: 1986/08/07, 34 y.o.   MRN: [id number]  HPI  Andrea Miller is a 34 y/o female with a history of asthma, HTN, SVT, obesity, bipolar, anxiety, depression, fibromyalgia, seizures, bulging disc and chronic heart failure.   Echo report from 09/16/18 reviewed and showed an EF of 55%. Echo report from 07/04/18 reviewed and showed an EF of 73%. Echo report from 04/15/17 reviewed and showed an EF of 60-65%.  Was in the ED 03/12/20 due to atypical chest pain where she was evaluated and released.   She presents today for a follow-up visit with a chief complaint of   Past Medical History:  Diagnosis Date  . Anxiety   . Asthma   . Bipolar disorder ([place])   . Chest pain    and angina  . CHF (congestive heart failure) ([place])   . Depression   . Fibromyalgia   . Hypertension   . 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 Obesity   . Pre-diabetes   . Pulmonary embolus ([place])   . Seizures ([place])    secondary to anxiety  last seizure 01/25/18  . SVT (supraventricular tachycardia) (HCC)    with hx of syncope; managed by [facility]   Past Surgical History:  Procedure Laterality Date  . CHOLECYSTECTOMY N/A 02/22/2018   Procedure: LAPAROSCOPIC CHOLECYSTECTOMY;  Surgeon: Vickie Epley, MD;  Location: [facility] ORS;  Service: General;  Laterality: N/A;  . TONSILLECTOMY AND ADENOIDECTOMY N/A 09/05/2016   Procedure: TONSILLECTOMY AND ADENOIDECTOMY;  Surgeon: Beverly Gust, MD;  Location: [facility] ORS;  Service: ENT;  Laterality: N/A;   Family History  Problem Relation Age of Onset  . Diabetes Mother   . Hypertension Mother   . Asthma Mother   . Gallstones Mother   . Diabetes Father   . Hypertension Father   . Gallstones Father   . Bipolar disorder Sister   . COPD Brother   . Schizophrenia Brother   . COPD Brother   . Hypertension Brother    Social History   Tobacco Use  . Smoking status: Never Smoker  . Smokeless  tobacco: Never Used  Substance Use Topics  . Alcohol use: No   Allergies  Allergen Reactions  . Cortizone-10 [Hydrocortisone] Shortness Of Breath and Swelling  . Garlic Anaphylaxis  . Prednisone Swelling    Tongue swelling  . Contrast Media [Iodinated Diagnostic Agents]     Pt reports that "every time" she receives contrast she "can't breathe" for a short time. At time of arrival to room after CT scan, she is breathing normally and VS WNL.  Pt states that she thought not breathing after contrast for a little while was normal. When asked if allergic or has any reaction to IV contrast patient stated no. LP   . Corticosteroids Palpitations      Review of Systems  Constitutional: Positive for fatigue. Negative for appetite change.  HENT: Negative for congestion, postnasal drip and sore throat.   Eyes: Positive for visual disturbance (blurry vision right eye).  Respiratory: Positive for chest tightness and shortness of breath ("better"). Negative for cough.   Cardiovascular: Positive for chest pain (intermittent) and leg swelling ("better"). Negative for palpitations.  Gastrointestinal: Negative for abdominal distention and abdominal pain.  Endocrine: Negative.   Genitourinary: Negative.   Musculoskeletal: Positive for arthralgias (leg pain) and back pain (due to bulging disc).  Skin: Negative.   Allergic/Immunologic:  Negative.   Neurological: Positive for light-headedness. Negative for dizziness and headaches.  Hematological: Negative for adenopathy. Does not bruise/bleed easily.  Psychiatric/Behavioral: Positive for dysphoric mood. Negative for sleep disturbance (wearing CPAP at night). The patient is not nervous/anxious.      Objective:   Physical Exam Vitals and nursing note reviewed.  Constitutional:      Appearance: She is well-developed.  HENT:     Head: Normocephalic and atraumatic.  Neck:     Vascular: No JVD.  Cardiovascular:     Rate and Rhythm: Normal rate and  regular rhythm.  Pulmonary:     Effort: Pulmonary effort is normal. No respiratory distress.     Breath sounds: No wheezing or rales.  Abdominal:     General: There is no distension.     Palpations: Abdomen is soft.  Musculoskeletal:     Cervical back: Normal range of motion and neck supple.     Right lower leg: No tenderness. No edema.     Left lower leg: No tenderness. No edema.  Skin:    General: Skin is warm and dry.  Neurological:     Mental Status: She is alert and oriented to person, place, and time.  Psychiatric:        Behavior: Behavior normal.       Assessment & Plan:   1: Chronic heart failure with preserved ejection fraction- - NYHA class II - euvolemic today - not weighing daily as she's waiting on her scale from [facility] to arrive; reminded to call for an overnight weight gain of >2 pounds or a weekly weight gain of >5 pounds - weight 357.2 pounds from last visit here 4 months ago  - not adding salt and occasionally reads food labels; reviewed the importance of closely following a 2000mg  sodium diet  - says that she's eating "a lot" of ice - saw cardiology ) ~ Nov 2020 - drinking ~ 64 ounces of fluid daily - BNP 03/12/20 was 18.0   2: HTN- - BP - follows with PCP (Aycock) at 03/14/20  - BMP 03/12/20 reviewed and showed sodium 138, potassium 3.9, creatinine 0.72 and GFR >60  3: Obstructive sleep apnea- - wearing CPAP nightly and feels like she's sleeping well - saw pulmonologist 03/14/20) 09/23/2019  4: Lymphedema- - stage 2 - resolved - continues to wear compression boots on ocassion   Patient did not bring her medications nor a list and does not know what she's taking. Says that her medications are bubble packed and she was instructed to bring them with her next week when she returns.

## 2020-03-24 ENCOUNTER — Ambulatory Visit: Payer: Medicare HMO | Admitting: Family

## 2020-03-24 ENCOUNTER — Other Ambulatory Visit: Admission: RE | Admit: 2020-03-24 | Payer: Medicare HMO | Source: Ambulatory Visit | Admitting: Ophthalmology

## 2020-03-24 ENCOUNTER — Ambulatory Visit: Admission: RE | Admit: 2020-03-24 | Payer: Medicare HMO | Source: Ambulatory Visit | Admitting: Ophthalmology

## 2020-03-24 ENCOUNTER — Ambulatory Visit
Admission: RE | Admit: 2020-03-24 | Discharge: 2020-03-24 | Disposition: A | Discharge status: Home / Self Care | Payer: Medicare HMO | Source: Ambulatory Visit | Attending: Ophthalmology | Admitting: Ophthalmology

## 2020-03-24 ENCOUNTER — Other Ambulatory Visit
Admission: RE | Admit: 2020-03-24 | Discharge: 2020-03-24 | Disposition: A | Discharge status: Home / Self Care | Payer: Medicare HMO | Source: Ambulatory Visit | Attending: Ophthalmology | Admitting: Ophthalmology

## 2020-03-24 DIAGNOSIS — J45909 Unspecified asthma, uncomplicated: Secondary | ICD-10-CM | POA: Insufficient documentation

## 2020-03-24 DIAGNOSIS — H209 Unspecified iridocyclitis: Secondary | ICD-10-CM

## 2020-03-24 DIAGNOSIS — H5213 Myopia, bilateral: Secondary | ICD-10-CM | POA: Diagnosis not present

## 2020-03-24 LAB — CBC WITH DIFFERENTIAL/PLATELET
Abs Immature Granulocytes: 0.01 10*3/uL (ref 0.00–0.07)
Basophils Absolute: 0 10*3/uL (ref 0.0–0.1)
Basophils Relative: 1 %
Eosinophils Absolute: 0.1 10*3/uL (ref 0.0–0.5)
Eosinophils Relative: 1 %
HCT: 38.4 % (ref 36.0–46.0)
Hemoglobin: 11.9 g/dL — ABNORMAL LOW (ref 12.0–15.0)
Immature Granulocytes: 0 %
Lymphocytes Relative: 41 %
Lymphs Abs: 1.9 10*3/uL (ref 0.7–4.0)
MCH: 26.4 pg (ref 26.0–34.0)
MCHC: 31 g/dL (ref 30.0–36.0)
MCV: 85.3 fL (ref 80.0–100.0)
Monocytes Absolute: 0.5 10*3/uL (ref 0.1–1.0)
Monocytes Relative: 10 %
Neutro Abs: 2.3 10*3/uL (ref 1.7–7.7)
Neutrophils Relative %: 47 %
Platelets: 252 10*3/uL (ref 150–400)
RBC: 4.5 MIL/uL (ref 3.87–5.11)
RDW: 14.7 % (ref 11.5–15.5)
WBC: 4.7 10*3/uL (ref 4.0–10.5)
nRBC: 0 % (ref 0.0–0.2)

## 2020-03-24 LAB — SEDIMENTATION RATE: Sed Rate: 30 mm/hr — ABNORMAL HIGH (ref 0–20)

## 2020-03-25 LAB — ANGIOTENSIN CONVERTING ENZYME: Angiotensin-Converting Enzyme: 41 U/L (ref 14–82)

## 2020-03-25 LAB — RPR: RPR Ser Ql: NONREACTIVE

## 2020-03-25 LAB — RHEUMATOID FACTOR: Rheumatoid fact SerPl-aCnc: <10 IU/mL (ref 0.0–13.9)

## 2020-03-26 ENCOUNTER — Ambulatory Visit: Payer: Medicare HMO

## 2020-03-26 ENCOUNTER — Inpatient Hospital Stay: Payer: Medicare HMO

## 2020-03-26 ENCOUNTER — Other Ambulatory Visit: Payer: Self-pay | Admitting: Hematology and Oncology

## 2020-03-26 LAB — QUANTIFERON-TB GOLD PLUS: QuantiFERON-TB Gold Plus: NEGATIVE

## 2020-03-26 LAB — QUANTIFERON-TB GOLD PLUS (RQFGPL)
QuantiFERON Mitogen Value: >10 IU/mL
QuantiFERON Nil Value: 0.16 IU/mL
QuantiFERON TB1 Ag Value: 0.15 IU/mL
QuantiFERON TB2 Ag Value: 0.15 IU/mL

## 2020-03-30 DIAGNOSIS — J45909 Unspecified asthma, uncomplicated: Secondary | ICD-10-CM | POA: Diagnosis not present

## 2020-03-31 DIAGNOSIS — R569 Unspecified convulsions: Secondary | ICD-10-CM | POA: Diagnosis not present

## 2020-03-31 DIAGNOSIS — J40 Bronchitis, not specified as acute or chronic: Secondary | ICD-10-CM | POA: Diagnosis not present

## 2020-03-31 DIAGNOSIS — I471 Supraventricular tachycardia: Secondary | ICD-10-CM | POA: Diagnosis not present

## 2020-04-01 DIAGNOSIS — R569 Unspecified convulsions: Secondary | ICD-10-CM | POA: Diagnosis not present

## 2020-04-01 DIAGNOSIS — I471 Supraventricular tachycardia: Secondary | ICD-10-CM | POA: Diagnosis not present

## 2020-04-08 ENCOUNTER — Inpatient Hospital Stay: Payer: Medicare HMO

## 2020-04-09 ENCOUNTER — Inpatient Hospital Stay: Payer: Medicare HMO

## 2020-04-15 ENCOUNTER — Inpatient Hospital Stay: Payer: Medicare HMO | Attending: Hematology and Oncology

## 2020-04-18 ENCOUNTER — Emergency Department: Payer: Medicare HMO

## 2020-04-18 ENCOUNTER — Emergency Department
Admission: EM | Admit: 2020-04-18 | Discharge: 2020-04-18 | Disposition: A | Discharge status: Home / Self Care | Payer: Medicare HMO | Attending: Emergency Medicine | Admitting: Emergency Medicine

## 2020-04-18 ENCOUNTER — Other Ambulatory Visit: Payer: Self-pay

## 2020-04-18 DIAGNOSIS — R112 Nausea with vomiting, unspecified: Secondary | ICD-10-CM | POA: Insufficient documentation

## 2020-04-18 DIAGNOSIS — I5032 Chronic diastolic (congestive) heart failure: Secondary | ICD-10-CM | POA: Insufficient documentation

## 2020-04-18 DIAGNOSIS — E119 Type 2 diabetes mellitus without complications: Secondary | ICD-10-CM | POA: Diagnosis not present

## 2020-04-18 DIAGNOSIS — Z79899 Other long term (current) drug therapy: Secondary | ICD-10-CM | POA: Insufficient documentation

## 2020-04-18 DIAGNOSIS — J45909 Unspecified asthma, uncomplicated: Secondary | ICD-10-CM | POA: Diagnosis not present

## 2020-04-18 DIAGNOSIS — I110 Hypertensive heart disease with heart failure: Secondary | ICD-10-CM | POA: Diagnosis not present

## 2020-04-18 DIAGNOSIS — I2510 Atherosclerotic heart disease of native coronary artery without angina pectoris: Secondary | ICD-10-CM | POA: Insufficient documentation

## 2020-04-18 DIAGNOSIS — R0600 Dyspnea, unspecified: Secondary | ICD-10-CM | POA: Diagnosis not present

## 2020-04-18 DIAGNOSIS — R197 Diarrhea, unspecified: Secondary | ICD-10-CM | POA: Diagnosis not present

## 2020-04-18 DIAGNOSIS — I10 Essential (primary) hypertension: Secondary | ICD-10-CM | POA: Diagnosis not present

## 2020-04-18 DIAGNOSIS — R110 Nausea: Secondary | ICD-10-CM

## 2020-04-18 DIAGNOSIS — R0602 Shortness of breath: Secondary | ICD-10-CM | POA: Diagnosis not present

## 2020-04-18 LAB — COMPREHENSIVE METABOLIC PANEL
ALT: 9 U/L (ref 0–44)
AST: 13 U/L — ABNORMAL LOW (ref 15–41)
Albumin: 4.1 g/dL (ref 3.5–5.0)
Alkaline Phosphatase: 71 U/L (ref 38–126)
Anion gap: 9 (ref 5–15)
BUN: 7 mg/dL (ref 6–20)
CO2: 23 mmol/L (ref 22–32)
Calcium: 9.2 mg/dL (ref 8.9–10.3)
Chloride: 106 mmol/L (ref 98–111)
Creatinine, Ser: 0.86 mg/dL (ref 0.44–1.00)
GFR calc Af Amer: >60 mL/min (ref >60)
GFR calc non Af Amer: >60 mL/min (ref >60)
Glucose, Bld: 142 mg/dL — ABNORMAL HIGH (ref 70–99)
Potassium: 3.2 mmol/L — ABNORMAL LOW (ref 3.5–5.1)
Sodium: 138 mmol/L (ref 135–145)
Total Bilirubin: 0.6 mg/dL (ref 0.3–1.2)
Total Protein: 7.9 g/dL (ref 6.5–8.1)

## 2020-04-18 LAB — CBC
HCT: 42.7 % (ref 36.0–46.0)
Hemoglobin: 13.1 g/dL (ref 12.0–15.0)
MCH: 26.3 pg (ref 26.0–34.0)
MCHC: 30.7 g/dL (ref 30.0–36.0)
MCV: 85.6 fL (ref 80.0–100.0)
Platelets: 261 10*3/uL (ref 150–400)
RBC: 4.99 MIL/uL (ref 3.87–5.11)
RDW: 14.9 % (ref 11.5–15.5)
WBC: 9 10*3/uL (ref 4.0–10.5)
nRBC: 0 % (ref 0.0–0.2)

## 2020-04-18 LAB — FIBRIN DERIVATIVES D-DIMER (ARMC ONLY): Fibrin derivatives D-dimer (ARMC): 379.32 ng/mL (FEU) (ref 0.00–499.00)

## 2020-04-18 LAB — LIPASE, BLOOD: Lipase: 23 U/L (ref 11–51)

## 2020-04-18 LAB — TROPONIN I (HIGH SENSITIVITY): Troponin I (High Sensitivity): 3 ng/L (ref <18)

## 2020-04-18 MED ORDER — SODIUM CHLORIDE 0.9% FLUSH: 3.0000 mL | Freq: Once | INTRAVENOUS | Status: DC

## 2020-04-18 MED ORDER — SODIUM CHLORIDE 0.9 % IV BOLUS
1000.0000 mL | Freq: Once | INTRAVENOUS | Status: AC
  Administered 2020-04-18: 1000 mL via INTRAVENOUS

## 2020-04-18 MED ORDER — ONDANSETRON 4 MG PO TBDP
4.0000 mg | ORAL_TABLET | Freq: Three times a day (TID) | ORAL | 0 refills | Status: DC | PRN
Start: 2020-04-18 — End: 2020-08-03

## 2020-04-18 MED ORDER — ONDANSETRON 4 MG PO TBDP
4.0000 mg | ORAL_TABLET | Freq: Once | ORAL | Status: AC | PRN
  Administered 2020-04-18: 4 mg via ORAL
  Filled 2020-04-18: qty 1

## 2020-04-18 MED ORDER — DICYCLOMINE HCL 10 MG PO CAPS: 10.0000 mg | ORAL_CAPSULE | Freq: Four times a day (QID) | ORAL | 0 refills | Status: DC | PRN

## 2020-04-18 MED ORDER — KETOROLAC TROMETHAMINE 30 MG/ML IJ SOLN
15.0000 mg | INTRAMUSCULAR | Status: AC
  Administered 2020-04-18: 15 mg via INTRAVENOUS
  Filled 2020-04-18: qty 1

## 2020-04-18 MED ORDER — ONDANSETRON HCL 4 MG/2ML IJ SOLN
4.0000 mg | Freq: Once | INTRAMUSCULAR | Status: AC
  Administered 2020-04-18: 4 mg via INTRAVENOUS
  Filled 2020-04-18: qty 2

## 2020-04-18 NOTE — Discharge Instructions (Addendum)
Your lab test today were okay and reassuring.  Please follow-up with your doctor for continued monitoring of your symptoms.

## 2020-04-18 NOTE — ED Notes (Signed)
Lab called. Pt needs recollect on purple top tube

## 2020-04-18 NOTE — ED Triage Notes (Signed)
Pt arrives via pov with c/o SOB, chest pain, headache, and  N/V/D x 2 days. Pt reports feeling dehydrated, and like she has been having palpations. Pain score 9/10, generalized pain.  Pt ambulatory to triage. NAD noted at this time. RR even, symmetrical, and unlabored.

## 2020-04-18 NOTE — ED Provider Notes (Signed)
[facility] Emergency Department Provider Note  Time seen: 2:35 PM  I have reviewed the triage vital signs and the nursing notes.   HISTORY  Chief Complaint Shortness of Breath and Nausea   HPI Andrea Miller is a 34 y.o. female with a past medical history anxiety, asthma, bipolar, CHF, fibromyalgia, hypertension, obesity, prior PE on Xarelto, presents to the emergency department for shortness of breath.  According to the patient for the past several days she has been feeling short of breath with some chest tightness.  Denies any "chest pain."  Denies any fever.  States slight cough and states this has been ongoing for several weeks.  Patient states that shortness of breath has been intermittent for several weeks since she got her first Covid vaccine approximately 4 weeks ago, but states it has been worse over the past 4 days.  Patient does have a history of a prior PE she is on Xarelto denies missing any doses.  Patient states some nausea this morning as well but no significant abdominal pain.   Patient has had loose stool for the past 2 to 3 days.  States she feels dehydrated.  Past Medical History:  Diagnosis Date  . Anxiety   . Asthma   . Bipolar disorder (HCC)   . Chest pain    and angina  . CHF (congestive heart failure) (HCC)   . Depression   . Fibromyalgia   . Hypertension   . 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 Obesity   . Pre-diabetes   . Pulmonary embolus (HCC)   . Seizures (HCC)    secondary to anxiety  last seizure 01/25/18  . SVT (supraventricular tachycardia) (HCC)    with hx of syncope; managed by [facility]    Patient Active Problem List   Diagnosis Date Noted  . Chronic anticoagulation 05/07/2019  . Iron deficiency anemia due to chronic blood loss 10/15/2018  . B12 deficiency 10/13/2018  . Symptomatic anemia 10/11/2018  . Multiple subsegmental pulmonary emboli without acute cor pulmonale (HCC)  09/15/2018  . Morbid obesity with BMI of 50.0-59.9, adult (HCC) 09/15/2018  . Chronic diastolic heart failure (HCC) 07/01/2018  . Lymphedema 07/01/2018  . Chronic cholecystitis   . Edema 02/13/2018  . Diabetes mellitus type 2, uncomplicated (HCC) 02/13/2018  . Hypertension 11/23/2017  . Asthma exacerbation 04/14/2017  . Asthma without status asthmaticus 01/16/2017  . Bipolar affective (HCC) 01/16/2017  . Coronary artery disease 01/16/2017  . Syncope 12/12/2016  . Fibromyalgia 04/27/2014  . OSA (obstructive sleep apnea) 04/27/2014  . Seizure-like activity (HCC) 04/27/2014  . Migraine without status migrainosus, not intractable 04/27/2014  . Obesity 04/15/2014  . Sleep disorder 04/15/2014  . Neck pain 04/15/2014  . Headache 04/15/2014  . Restless legs syndrome 04/15/2014  . Supraventricular tachycardia (HCC) 07/13/2009    Past Surgical History:  Procedure Laterality Date  . CHOLECYSTECTOMY N/A 02/22/2018   Procedure: LAPAROSCOPIC CHOLECYSTECTOMY;  Surgeon: Ancil Linsey, MD;  Location: [facility] ORS;  Service: General;  Laterality: N/A;  . TONSILLECTOMY AND ADENOIDECTOMY N/A 09/05/2016   Procedure: TONSILLECTOMY AND ADENOIDECTOMY;  Surgeon: Linus Salmons, MD;  Location: [facility] ORS;  Service: ENT;  Laterality: N/A;    Prior to Admission medications   Medication Sig Start Date End Date Taking? Authorizing Provider  Adapalene 0.3 % gel Apply 1 application topically at bedtime.  10/20/19   [provider]  albuterol (PROVENTIL) (2.5 MG/3ML) 0.083% nebulizer solution Take 3 mLs (  2.5 mg total) by nebulization every 4 (four) hours as needed for wheezing or shortness of breath. 09/23/19 03/18/20  Salena Saner, MD  Albuterol Sulfate 108 (90 Base) MCG/ACT AEPB Inhale 2 puffs into the lungs every 6 (six) hours as needed. 09/23/19 03/18/20  Salena Saner, MD  azithromycin (ZITHROMAX) 250 MG tablet Take 1 tablet (250 mg total) by mouth daily. 01/17/20   Irean Hong, MD   budesonide-formoterol [facility]) 160-4.5 MCG/ACT inhaler Inhale 2 puffs into the lungs 2 (two) times daily. 09/23/19 03/18/20  Salena Saner, MD  buprenorphine (SUBUTEX) 2 MG SUBL SL tablet SMARTSIG:0.5 Tablet(s) Sublingual Twice Daily PRN 03/12/20   [provider]  busPIRone (BUSPAR) 10 MG tablet Take 10 mg by mouth 2 (two) times daily.  10/22/19   [provider]  Calcium-Magnesium 500-250 MG TABS Take 1 tablet by mouth 2 (two) times daily.     [provider]  clonazePAM (KLONOPIN) 1 MG tablet Take 1 tablet by mouth 2 (two) times a day. 04/02/19   [provider]  cyanocobalamin (,VITAMIN B-12,) 1000 MCG/ML injection Inject 1,000 mcg into the muscle every 30 (thirty) days.    [provider]  Dexlansoprazole (DEXILANT) 30 MG capsule Take 30 mg by mouth daily.    [provider]  diclofenac Sodium (VOLTAREN) 1 % GEL Apply 2 g topically 4 (four) times daily. 03/12/20   Dartha Lodge, PA-C  diltiazem (CARDIZEM CD) 360 MG 24 hr capsule Take 360 mg by mouth every morning. 12/18/18   [provider]  ELIDEL 1 % cream Apply to aa's eczema QD-BID PRN 01/20/20   [provider]  escitalopram (LEXAPRO) 20 MG tablet Take 20 mg by mouth daily.    [provider]  fluticasone (FLONASE) 50 MCG/ACT nasal spray Place 2 sprays into both nostrils daily as needed for allergies.     [provider]  folic acid (FOLVITE) 1 MG tablet Take 1 mg by mouth daily.  12/15/16   [provider]  furosemide (LASIX) 20 MG tablet Take 40 mg by mouth 2 (two) times daily.     [provider]  gabapentin (NEURONTIN) 400 MG capsule Take 400 mg by mouth 3 (three) times daily.    [provider]  hydroxypropyl methylcellulose / hypromellose (ISOPTO TEARS / GONIOVISC) 2.5 % ophthalmic solution Place 1 drop into both eyes 3 (three) times daily as needed for dry eyes.    [provider]  hydrOXYzine  (ATARAX/VISTARIL) 10 MG tablet Take 10 mg by mouth 2 (two) times daily. 11/18/19   [provider]  Iron-Vitamin C 65-125 MG TABS Take 1 tablet by mouth 2 (two) times daily. 09/19/19   Rosey Bath, MD  isosorbide mononitrate (IMDUR) 30 MG 24 hr tablet Take 30 mg by mouth daily. 03/08/20   [provider]  ketoconazole (NIZORAL) 2 % shampoo Apply 1 application topically every 30 (thirty) days.  10/01/18   [provider]  lactulose (CHRONULAC) 10 GM/15ML solution Take 30 g by mouth daily as needed for mild constipation.  08/28/18   [provider]  lamoTRIgine (LAMICTAL) 200 MG tablet Take 1 tablet by mouth 2 (two) times a day. 05/20/19   [provider]  linaclotide (LINZESS) 290 MCG CAPS capsule Take 290 mcg by mouth daily before breakfast.    [provider]  magic mouthwash SOLN Take 5 mLs by mouth 3 (three) times daily as needed for mouth pain. 01/17/20   Chiquita Loth  J, MD  methocarbamol (ROBAXIN) 500 MG tablet Take 1 tablet (500 mg total) by mouth 2 (two) times daily. 03/12/20   Jacqlyn Larsen, PA-C  montelukast (SINGULAIR) 10 MG tablet Take 1 tablet (10 mg total) by mouth at bedtime. 09/23/19 03/18/20  Tyler Pita, MD  nortriptyline (PAMELOR) 10 MG capsule Start Nortriptyline (Pamelor) 10 mg nightly for one week, then increase to 20 mg nightly 12/25/19   [provider]  ondansetron (ZOFRAN ODT) 4 MG disintegrating tablet Take 1 tablet (4 mg total) by mouth every 8 (eight) hours as needed. 01/17/20   Paulette Blanch, MD  Potassium Chloride ER 20 MEQ TBCR Take 20 mEq by mouth 2 (two) times daily.     [provider]  prednisoLONE acetate (PRED FORTE) 1 % ophthalmic suspension Place 1 drop into the right eye 4 (four) times daily.  10/15/19   [provider]  propranolol (INDERAL) 40 MG tablet Take 2 tablets (80 mg total) by mouth 3 (three) times daily. 10/13/18   Salary, Avel Peace, MD  risperiDONE (RISPERDAL) 3 MG  tablet Take 3 mg by mouth at bedtime.     [provider]  rivaroxaban (XARELTO) 20 MG TABS tablet Take 20 mg by mouth every morning.    [provider]  rosuvastatin (CRESTOR) 20 MG tablet Take 20 mg by mouth daily.    [provider]  tiZANidine (ZANAFLEX) 4 MG tablet Take 4 mg by mouth 3 (three) times daily.  03/26/17   [provider]  Topiramate ER (TROKENDI XR) 200 MG CP24 Take 1 capsule by mouth daily.     [provider]  vitamin C (VITAMIN C) 250 MG tablet Take 1 tablet (250 mg total) by mouth 2 (two) times daily. 10/13/18   Salary, Avel Peace, MD    Allergies  Allergen Reactions  . Cortizone-10 [Hydrocortisone] Shortness Of Breath and Swelling  . Garlic Anaphylaxis  . Prednisone Swelling    Tongue swelling  . Contrast Media [Iodinated Diagnostic Agents]     Pt reports that "every time" she receives contrast she "can't breathe" for a short time. At time of arrival to room after CT scan, she is breathing normally and VS WNL.  Pt states that she thought not breathing after contrast for a little while was normal. When asked if allergic or has any reaction to IV contrast patient stated no. LP   . Corticosteroids Palpitations    Family History  Problem Relation Age of Onset  . Diabetes Mother   . Hypertension Mother   . Asthma Mother   . Gallstones Mother   . Diabetes Father   . Hypertension Father   . Gallstones Father   . Bipolar disorder Sister   . COPD Brother   . Schizophrenia Brother   . COPD Brother   . Hypertension Brother     Social History Social History   Tobacco Use  . Smoking status: Never Smoker  . Smokeless tobacco: Never Used  Substance Use Topics  . Alcohol use: No  . Drug use: No    Review of Systems Constitutional: Negative for fever. Cardiovascular: Positive for chest tightness Respiratory: Positive for shortness of breath occasional cough Gastrointestinal: Denies any abdominal pain does take  cramping at times.  Positive for nausea vomiting and diarrhea for the past 2 to 3 days Genitourinary: Negative for urinary compaints Musculoskeletal: Negative for musculoskeletal complaints Neurological: Negative for headache All other ROS negative  ____________________________________________   PHYSICAL EXAM:  VITAL  SIGNS: ED Triage Vitals  Enc Vitals Group     BP 04/18/20 1210 (!) 144/103     Pulse Rate 04/18/20 1210 (!) 106     Resp 04/18/20 1210 18     Temp 04/18/20 1220 98.5 F (36.9 C)     Temp Source 04/18/20 1220 Oral     SpO2 04/18/20 1210 99 %     Weight 04/18/20 1216 (!) 320 lb (145.2 kg)     Height 04/18/20 1216 5\' 10"  (1.778 m)     Head Circumference --      Peak Flow --      Pain Score --      Pain Loc --      Pain Edu? --      Excl. in GC? --     Constitutional: Alert and oriented. Well appearing and in no distress. Eyes: Normal exam ENT      Head: Normocephalic and atraumatic.      Mouth/Throat: Mucous membranes are moist. Cardiovascular: Normal rate, regular rhythm around 90 bpm. Respiratory: Normal respiratory effort without tachypnea nor retractions. Breath sounds are clear  Gastrointestinal: Soft and nontender. No distention.  Obese. Musculoskeletal: Nontender with normal range of motion in all extremities. Neurologic:  Normal speech and language. No gross focal neurologic deficits  Skin:  Skin is warm, dry and intact.  Psychiatric: Mood and affect are normal.   ____________________________________________    EKG  EKG viewed and interpreted by myself shows a normal sinus rhythm at 94 bpm with a narrow QRS, normal axis, normal intervals, no concerning ST changes  ____________________________________________    RADIOLOGY  Chest x-ray is negative.  ____________________________________________   INITIAL IMPRESSION / ASSESSMENT AND PLAN / ED COURSE  Pertinent labs & imaging results that were available during my care of the patient were  reviewed by me and considered in my medical decision making (see chart for details).   Patient presents to the emergency department for shortness of breath nausea vomiting diarrhea and feels dehydrated.  Patient states that shortness of breath has been ongoing issue for times several weeks but states she feels it is worse.  Denies any "chest pain" but does state tightness.  States nausea vomiting diarrhea over the past 2 to 3 days as well.  Patient states she feels dehydrated.  We will check labs including cardiac enzymes and abdominal labs.  Chest x-ray is clear, EKG is reassuring.  Given the patient's past history of a PE I have added on a D-dimer although no pleuritic pain.  We will IV hydrate while awaiting results.  Patient agreeable to plan of care.  Andrea Miller was evaluated in Emergency Department on 04/18/2020 for the symptoms described in the history of present illness. She was evaluated in the context of the global COVID-19 pandemic, which necessitated consideration that the patient might be at risk for infection with the SARS-CoV-2 virus that causes COVID-19. Institutional protocols and algorithms that pertain to the evaluation of patients at risk for COVID-19 are in a state of rapid change based on information released by regulatory bodies including the CDC and federal and state organizations. These policies and algorithms were followed during the patient's care in the ED.  ____________________________________________   FINAL CLINICAL IMPRESSION(S) / ED DIAGNOSES  Nausea vomiting diarrhea Shortness of breath   04/20/2020, MD 04/20/20 2057

## 2020-04-18 NOTE — ED Provider Notes (Signed)
Angiocath insertion  Date/Time: 04/18/2020 5:03 PM Performed by: Sharman Cheek, MD Authorized by: Sharman Cheek, MD  Consent: Verbal consent obtained. Consent given by: patient Time out: Immediately prior to procedure a "time out" was called to verify the correct patient, procedure, equipment, support staff and site/side marked as required. Preparation: Patient was prepped and draped in the usual sterile fashion. Local anesthesia used: no  Anesthesia: Local anesthesia used: no  Sedation: Patient sedated: no  Patient tolerance: patient tolerated the procedure well with no immediate complications Comments: Performed by me due to multiple failed attempts by nursing / IV consult team.   Continuous [place] visualization.  1 attempt, 20gauge, R forearm. EBL 0.         ----------------------------------------- 7:27 PM on 04/18/2020 -----------------------------------------  Lab results unremarkable, vital signs stable.  D-dimer low, patient feeling better and stable for discharge home.     Sharman Cheek, MD 04/18/20 Serena Croissant

## 2020-04-18 NOTE — ED Notes (Signed)
Phlebotomy unable to obtain blood at this time.

## 2020-04-18 NOTE — ED Notes (Signed)
Per Gerilyn Pilgrim Pt is a very difficult stick. RN called the lab and asked that they send someone to re draw blood

## 2020-04-18 NOTE — ED Notes (Signed)
Unable to obtain IV access after 2 unsuccessful attempts.  Asked another RN to try, but unsuccessful. Dr. Scotty Court made aware.  No acute change in patient condition.  Resting in position of comfort.  Will continue to monitor.

## 2020-04-23 DIAGNOSIS — M4716 Other spondylosis with myelopathy, lumbar region: Secondary | ICD-10-CM | POA: Diagnosis not present

## 2020-04-23 DIAGNOSIS — G894 Chronic pain syndrome: Secondary | ICD-10-CM | POA: Diagnosis not present

## 2020-04-23 DIAGNOSIS — M5416 Radiculopathy, lumbar region: Secondary | ICD-10-CM | POA: Diagnosis not present

## 2020-04-23 DIAGNOSIS — M5126 Other intervertebral disc displacement, lumbar region: Secondary | ICD-10-CM | POA: Diagnosis not present

## 2020-04-29 ENCOUNTER — Other Ambulatory Visit: Payer: Self-pay | Admitting: Hematology and Oncology

## 2020-04-29 MED ORDER — RIVAROXABAN 20 MG PO TABS: 20.0000 mg | ORAL_TABLET | Freq: Every morning | ORAL | 2 refills | Status: DC

## 2020-04-30 DIAGNOSIS — J45909 Unspecified asthma, uncomplicated: Secondary | ICD-10-CM | POA: Diagnosis not present

## 2020-05-04 DIAGNOSIS — H52223 Regular astigmatism, bilateral: Secondary | ICD-10-CM | POA: Diagnosis not present

## 2020-05-05 DIAGNOSIS — G4452 New daily persistent headache (NDPH): Secondary | ICD-10-CM | POA: Diagnosis not present

## 2020-05-06 ENCOUNTER — Other Ambulatory Visit (HOSPITAL_COMMUNITY): Payer: Self-pay | Admitting: Ophthalmology

## 2020-05-06 ENCOUNTER — Other Ambulatory Visit: Payer: Self-pay | Admitting: Ophthalmology

## 2020-05-06 DIAGNOSIS — G4452 New daily persistent headache (NDPH): Secondary | ICD-10-CM

## 2020-05-10 ENCOUNTER — Inpatient Hospital Stay: Payer: Medicare HMO

## 2020-05-10 DIAGNOSIS — I509 Heart failure, unspecified: Secondary | ICD-10-CM | POA: Diagnosis not present

## 2020-05-12 DIAGNOSIS — I509 Heart failure, unspecified: Secondary | ICD-10-CM | POA: Diagnosis not present

## 2020-05-13 ENCOUNTER — Telehealth: Payer: Self-pay | Admitting: General Practice

## 2020-05-13 ENCOUNTER — Inpatient Hospital Stay: Payer: Medicare HMO

## 2020-05-13 ENCOUNTER — Other Ambulatory Visit: Payer: Self-pay

## 2020-05-13 ENCOUNTER — Inpatient Hospital Stay: Payer: Medicare HMO | Attending: Hematology and Oncology

## 2020-05-13 ENCOUNTER — Other Ambulatory Visit: Payer: Self-pay | Admitting: Hematology and Oncology

## 2020-05-13 DIAGNOSIS — D509 Iron deficiency anemia, unspecified: Secondary | ICD-10-CM | POA: Insufficient documentation

## 2020-05-13 DIAGNOSIS — E538 Deficiency of other specified B group vitamins: Secondary | ICD-10-CM | POA: Insufficient documentation

## 2020-05-13 MED ORDER — CYANOCOBALAMIN 1000 MCG/ML IJ SOLN
1000.0000 ug | Freq: Once | INTRAMUSCULAR | Status: AC
  Administered 2020-05-13: 1000 ug via INTRAMUSCULAR
  Filled 2020-05-13: qty 1

## 2020-05-13 NOTE — Telephone Encounter (Signed)
Patient calling about getting referral sent over.

## 2020-05-16 ENCOUNTER — Other Ambulatory Visit: Payer: Self-pay

## 2020-05-16 ENCOUNTER — Ambulatory Visit
Admission: RE | Admit: 2020-05-16 | Discharge: 2020-05-16 | Disposition: A | Discharge status: Home / Self Care | Payer: Medicare HMO | Source: Ambulatory Visit | Attending: Ophthalmology | Admitting: Ophthalmology

## 2020-05-16 DIAGNOSIS — R569 Unspecified convulsions: Secondary | ICD-10-CM | POA: Diagnosis not present

## 2020-05-16 DIAGNOSIS — I10 Essential (primary) hypertension: Secondary | ICD-10-CM | POA: Diagnosis not present

## 2020-05-16 DIAGNOSIS — R519 Headache, unspecified: Secondary | ICD-10-CM | POA: Diagnosis not present

## 2020-05-16 DIAGNOSIS — G4452 New daily persistent headache (NDPH): Secondary | ICD-10-CM | POA: Diagnosis not present

## 2020-05-17 ENCOUNTER — Other Ambulatory Visit: Payer: Self-pay | Admitting: *Deleted

## 2020-05-17 DIAGNOSIS — I509 Heart failure, unspecified: Secondary | ICD-10-CM | POA: Diagnosis not present

## 2020-05-17 NOTE — Telephone Encounter (Signed)
Call from patient and daughter requesting a refill of her Xarelto stating that she has been out of medicine for 2 weeks. When I checked chart, I see that it was refilled 6/4 for 30 day supple. I called back and got voice mail and left message that medicine is at [facility] pharmacy sine 04/30/20

## 2020-05-19 DIAGNOSIS — I509 Heart failure, unspecified: Secondary | ICD-10-CM | POA: Diagnosis not present

## 2020-05-21 DIAGNOSIS — I509 Heart failure, unspecified: Secondary | ICD-10-CM | POA: Diagnosis not present

## 2020-05-24 DIAGNOSIS — M5126 Other intervertebral disc displacement, lumbar region: Secondary | ICD-10-CM | POA: Diagnosis not present

## 2020-05-24 DIAGNOSIS — M4716 Other spondylosis with myelopathy, lumbar region: Secondary | ICD-10-CM | POA: Diagnosis not present

## 2020-05-24 DIAGNOSIS — M5416 Radiculopathy, lumbar region: Secondary | ICD-10-CM | POA: Diagnosis not present

## 2020-05-24 DIAGNOSIS — I509 Heart failure, unspecified: Secondary | ICD-10-CM | POA: Diagnosis not present

## 2020-05-24 DIAGNOSIS — G8929 Other chronic pain: Secondary | ICD-10-CM | POA: Diagnosis not present

## 2020-05-26 DIAGNOSIS — I509 Heart failure, unspecified: Secondary | ICD-10-CM | POA: Diagnosis not present

## 2020-05-28 DIAGNOSIS — I10 Essential (primary) hypertension: Secondary | ICD-10-CM | POA: Diagnosis not present

## 2020-05-28 DIAGNOSIS — I509 Heart failure, unspecified: Secondary | ICD-10-CM | POA: Diagnosis not present

## 2020-05-30 DIAGNOSIS — J45909 Unspecified asthma, uncomplicated: Secondary | ICD-10-CM | POA: Diagnosis not present

## 2020-06-07 ENCOUNTER — Other Ambulatory Visit: Payer: Self-pay

## 2020-06-07 ENCOUNTER — Ambulatory Visit
Admission: RE | Admit: 2020-06-07 | Discharge: 2020-06-07 | Disposition: A | Discharge status: Home / Self Care | Payer: Medicare HMO | Source: Ambulatory Visit | Attending: Family Medicine | Admitting: Family Medicine

## 2020-06-07 ENCOUNTER — Other Ambulatory Visit: Payer: Self-pay | Admitting: Family Medicine

## 2020-06-07 DIAGNOSIS — M79662 Pain in left lower leg: Secondary | ICD-10-CM | POA: Diagnosis not present

## 2020-06-07 DIAGNOSIS — I10 Essential (primary) hypertension: Secondary | ICD-10-CM | POA: Diagnosis not present

## 2020-06-07 DIAGNOSIS — R69 Illness, unspecified: Secondary | ICD-10-CM | POA: Diagnosis not present

## 2020-06-07 DIAGNOSIS — R252 Cramp and spasm: Secondary | ICD-10-CM

## 2020-06-07 DIAGNOSIS — E876 Hypokalemia: Secondary | ICD-10-CM | POA: Diagnosis not present

## 2020-06-07 DIAGNOSIS — M79605 Pain in left leg: Secondary | ICD-10-CM | POA: Diagnosis not present

## 2020-06-07 DIAGNOSIS — R5383 Other fatigue: Secondary | ICD-10-CM | POA: Diagnosis not present

## 2020-06-07 DIAGNOSIS — D649 Anemia, unspecified: Secondary | ICD-10-CM | POA: Diagnosis not present

## 2020-06-08 ENCOUNTER — Inpatient Hospital Stay: Payer: Medicare HMO

## 2020-06-08 ENCOUNTER — Inpatient Hospital Stay: Payer: Medicare HMO | Attending: Hematology and Oncology

## 2020-06-08 DIAGNOSIS — R5383 Other fatigue: Secondary | ICD-10-CM | POA: Insufficient documentation

## 2020-06-08 DIAGNOSIS — I110 Hypertensive heart disease with heart failure: Secondary | ICD-10-CM | POA: Diagnosis not present

## 2020-06-08 DIAGNOSIS — Z7901 Long term (current) use of anticoagulants: Secondary | ICD-10-CM | POA: Insufficient documentation

## 2020-06-08 DIAGNOSIS — R42 Dizziness and giddiness: Secondary | ICD-10-CM | POA: Diagnosis not present

## 2020-06-08 DIAGNOSIS — I2699 Other pulmonary embolism without acute cor pulmonale: Secondary | ICD-10-CM | POA: Insufficient documentation

## 2020-06-08 DIAGNOSIS — I509 Heart failure, unspecified: Secondary | ICD-10-CM | POA: Diagnosis not present

## 2020-06-08 DIAGNOSIS — R69 Illness, unspecified: Secondary | ICD-10-CM | POA: Diagnosis not present

## 2020-06-08 DIAGNOSIS — R0602 Shortness of breath: Secondary | ICD-10-CM | POA: Insufficient documentation

## 2020-06-08 DIAGNOSIS — R079 Chest pain, unspecified: Secondary | ICD-10-CM | POA: Diagnosis not present

## 2020-06-08 DIAGNOSIS — M79606 Pain in leg, unspecified: Secondary | ICD-10-CM | POA: Insufficient documentation

## 2020-06-08 DIAGNOSIS — M79605 Pain in left leg: Secondary | ICD-10-CM | POA: Diagnosis not present

## 2020-06-08 DIAGNOSIS — Z9114 Patient's other noncompliance with medication regimen: Secondary | ICD-10-CM | POA: Insufficient documentation

## 2020-06-08 DIAGNOSIS — M549 Dorsalgia, unspecified: Secondary | ICD-10-CM | POA: Insufficient documentation

## 2020-06-08 DIAGNOSIS — N920 Excessive and frequent menstruation with regular cycle: Secondary | ICD-10-CM | POA: Insufficient documentation

## 2020-06-08 DIAGNOSIS — Z6841 Body Mass Index (BMI) 40.0 and over, adult: Secondary | ICD-10-CM | POA: Insufficient documentation

## 2020-06-08 DIAGNOSIS — Z8249 Family history of ischemic heart disease and other diseases of the circulatory system: Secondary | ICD-10-CM | POA: Insufficient documentation

## 2020-06-08 DIAGNOSIS — R0789 Other chest pain: Secondary | ICD-10-CM | POA: Diagnosis not present

## 2020-06-08 DIAGNOSIS — Z86711 Personal history of pulmonary embolism: Secondary | ICD-10-CM | POA: Insufficient documentation

## 2020-06-08 DIAGNOSIS — M7989 Other specified soft tissue disorders: Secondary | ICD-10-CM | POA: Diagnosis not present

## 2020-06-08 DIAGNOSIS — Z888 Allergy status to other drugs, medicaments and biological substances status: Secondary | ICD-10-CM | POA: Insufficient documentation

## 2020-06-08 DIAGNOSIS — R791 Abnormal coagulation profile: Secondary | ICD-10-CM | POA: Diagnosis not present

## 2020-06-08 DIAGNOSIS — Z836 Family history of other diseases of the respiratory system: Secondary | ICD-10-CM | POA: Insufficient documentation

## 2020-06-08 DIAGNOSIS — Z8379 Family history of other diseases of the digestive system: Secondary | ICD-10-CM | POA: Insufficient documentation

## 2020-06-08 DIAGNOSIS — R7989 Other specified abnormal findings of blood chemistry: Secondary | ICD-10-CM | POA: Insufficient documentation

## 2020-06-08 DIAGNOSIS — Z818 Family history of other mental and behavioral disorders: Secondary | ICD-10-CM | POA: Insufficient documentation

## 2020-06-08 DIAGNOSIS — E538 Deficiency of other specified B group vitamins: Secondary | ICD-10-CM | POA: Insufficient documentation

## 2020-06-08 DIAGNOSIS — Z833 Family history of diabetes mellitus: Secondary | ICD-10-CM | POA: Insufficient documentation

## 2020-06-08 DIAGNOSIS — D500 Iron deficiency anemia secondary to blood loss (chronic): Secondary | ICD-10-CM | POA: Insufficient documentation

## 2020-06-08 DIAGNOSIS — D6851 Activated protein C resistance: Secondary | ICD-10-CM | POA: Insufficient documentation

## 2020-06-08 DIAGNOSIS — Z79899 Other long term (current) drug therapy: Secondary | ICD-10-CM | POA: Insufficient documentation

## 2020-06-09 ENCOUNTER — Encounter: Payer: Self-pay | Admitting: Nurse Practitioner

## 2020-06-09 ENCOUNTER — Inpatient Hospital Stay (HOSPITAL_BASED_OUTPATIENT_CLINIC_OR_DEPARTMENT_OTHER): Payer: Medicare HMO | Admitting: Nurse Practitioner

## 2020-06-09 ENCOUNTER — Inpatient Hospital Stay: Payer: Medicare HMO

## 2020-06-09 ENCOUNTER — Ambulatory Visit: Payer: Medicare HMO

## 2020-06-09 ENCOUNTER — Other Ambulatory Visit: Payer: Self-pay | Admitting: Pulmonary Disease

## 2020-06-09 ENCOUNTER — Other Ambulatory Visit: Payer: Self-pay

## 2020-06-09 VITALS — BP 119/72 | HR 103 | Temp 97.2°F | Wt 383.6 lb

## 2020-06-09 DIAGNOSIS — Z888 Allergy status to other drugs, medicaments and biological substances status: Secondary | ICD-10-CM | POA: Diagnosis not present

## 2020-06-09 DIAGNOSIS — Z79899 Other long term (current) drug therapy: Secondary | ICD-10-CM | POA: Diagnosis not present

## 2020-06-09 DIAGNOSIS — Z818 Family history of other mental and behavioral disorders: Secondary | ICD-10-CM | POA: Diagnosis not present

## 2020-06-09 DIAGNOSIS — Z6841 Body Mass Index (BMI) 40.0 and over, adult: Secondary | ICD-10-CM | POA: Diagnosis not present

## 2020-06-09 DIAGNOSIS — D500 Iron deficiency anemia secondary to blood loss (chronic): Secondary | ICD-10-CM | POA: Diagnosis not present

## 2020-06-09 DIAGNOSIS — I2699 Other pulmonary embolism without acute cor pulmonale: Secondary | ICD-10-CM | POA: Diagnosis not present

## 2020-06-09 DIAGNOSIS — Z8379 Family history of other diseases of the digestive system: Secondary | ICD-10-CM | POA: Diagnosis not present

## 2020-06-09 DIAGNOSIS — R5383 Other fatigue: Secondary | ICD-10-CM | POA: Diagnosis not present

## 2020-06-09 DIAGNOSIS — Z8249 Family history of ischemic heart disease and other diseases of the circulatory system: Secondary | ICD-10-CM | POA: Diagnosis not present

## 2020-06-09 DIAGNOSIS — N920 Excessive and frequent menstruation with regular cycle: Secondary | ICD-10-CM | POA: Diagnosis not present

## 2020-06-09 DIAGNOSIS — Z86711 Personal history of pulmonary embolism: Secondary | ICD-10-CM | POA: Diagnosis not present

## 2020-06-09 DIAGNOSIS — R079 Chest pain, unspecified: Secondary | ICD-10-CM | POA: Diagnosis not present

## 2020-06-09 DIAGNOSIS — D6851 Activated protein C resistance: Secondary | ICD-10-CM | POA: Diagnosis not present

## 2020-06-09 DIAGNOSIS — Z9114 Patient's other noncompliance with medication regimen: Secondary | ICD-10-CM | POA: Diagnosis not present

## 2020-06-09 DIAGNOSIS — E538 Deficiency of other specified B group vitamins: Secondary | ICD-10-CM

## 2020-06-09 DIAGNOSIS — M549 Dorsalgia, unspecified: Secondary | ICD-10-CM | POA: Diagnosis not present

## 2020-06-09 DIAGNOSIS — R7989 Other specified abnormal findings of blood chemistry: Secondary | ICD-10-CM | POA: Diagnosis not present

## 2020-06-09 DIAGNOSIS — Z7901 Long term (current) use of anticoagulants: Secondary | ICD-10-CM | POA: Diagnosis not present

## 2020-06-09 DIAGNOSIS — R0602 Shortness of breath: Secondary | ICD-10-CM | POA: Diagnosis not present

## 2020-06-09 DIAGNOSIS — R42 Dizziness and giddiness: Secondary | ICD-10-CM | POA: Diagnosis not present

## 2020-06-09 DIAGNOSIS — Z836 Family history of other diseases of the respiratory system: Secondary | ICD-10-CM | POA: Diagnosis not present

## 2020-06-09 DIAGNOSIS — Z833 Family history of diabetes mellitus: Secondary | ICD-10-CM | POA: Diagnosis not present

## 2020-06-09 DIAGNOSIS — M79606 Pain in leg, unspecified: Secondary | ICD-10-CM | POA: Diagnosis not present

## 2020-06-09 MED ORDER — CYANOCOBALAMIN 1000 MCG/ML IJ SOLN
1000.0000 ug | Freq: Once | INTRAMUSCULAR | Status: AC
  Administered 2020-06-09: 1000 ug via INTRAMUSCULAR

## 2020-06-09 NOTE — Telephone Encounter (Signed)
She needs a refill on Symbicort 160/4.5, 2 inhalations twice a day do not give more than 1 refill she needs to be seen.  Recommend also prednisone taper prednisone 10 mg number 21 tablet pack use as directed in pack.

## 2020-06-09 NOTE — Patient Instructions (Signed)

## 2020-06-09 NOTE — Progress Notes (Signed)
Increase SOB and dizziness with standing and moving around.

## 2020-06-09 NOTE — Telephone Encounter (Addendum)
Called and spoke to pt, who reports of increased sob with exertion, non prod cough, dizziness and wheezing x2w.  Using albuterol HFA Q3H with some relief in sx.  Pt not currently on maintenance inhaler, due to running out of medication. Pt has been without symbicort for 58mo.   Pt feels that sx are related to heat.  Denies fever, chills or sweats.  Dr. Jayme Cloud, please advise. Thanks

## 2020-06-09 NOTE — Telephone Encounter (Signed)
ATC x2-- unable to leave vm due to mailbox being full.  

## 2020-06-09 NOTE — Telephone Encounter (Signed)
Pt states she is also sob due to the heat and feels pressure in chest
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
## 2020-06-09 NOTE — Telephone Encounter (Signed)
ATC pt- unable to leave vm due to mailbox being full.  

## 2020-06-10 ENCOUNTER — Inpatient Hospital Stay: Payer: Medicare HMO

## 2020-06-10 DIAGNOSIS — R079 Chest pain, unspecified: Secondary | ICD-10-CM | POA: Diagnosis not present

## 2020-06-10 DIAGNOSIS — N920 Excessive and frequent menstruation with regular cycle: Secondary | ICD-10-CM | POA: Diagnosis not present

## 2020-06-10 DIAGNOSIS — E538 Deficiency of other specified B group vitamins: Secondary | ICD-10-CM | POA: Diagnosis not present

## 2020-06-10 DIAGNOSIS — R5383 Other fatigue: Secondary | ICD-10-CM | POA: Diagnosis not present

## 2020-06-10 DIAGNOSIS — B372 Candidiasis of skin and nail: Secondary | ICD-10-CM | POA: Diagnosis not present

## 2020-06-10 DIAGNOSIS — D649 Anemia, unspecified: Secondary | ICD-10-CM | POA: Diagnosis not present

## 2020-06-10 DIAGNOSIS — R0602 Shortness of breath: Secondary | ICD-10-CM | POA: Diagnosis not present

## 2020-06-10 DIAGNOSIS — R519 Headache, unspecified: Secondary | ICD-10-CM | POA: Diagnosis not present

## 2020-06-10 DIAGNOSIS — R42 Dizziness and giddiness: Secondary | ICD-10-CM | POA: Diagnosis not present

## 2020-06-10 DIAGNOSIS — M79606 Pain in leg, unspecified: Secondary | ICD-10-CM | POA: Diagnosis not present

## 2020-06-10 DIAGNOSIS — R7989 Other specified abnormal findings of blood chemistry: Secondary | ICD-10-CM | POA: Diagnosis not present

## 2020-06-10 DIAGNOSIS — M549 Dorsalgia, unspecified: Secondary | ICD-10-CM | POA: Diagnosis not present

## 2020-06-10 DIAGNOSIS — D500 Iron deficiency anemia secondary to blood loss (chronic): Secondary | ICD-10-CM | POA: Diagnosis not present

## 2020-06-10 LAB — COMPREHENSIVE METABOLIC PANEL
ALT: 10 U/L (ref 0–44)
AST: 16 U/L (ref 15–41)
Albumin: 4.1 g/dL (ref 3.5–5.0)
Alkaline Phosphatase: 74 U/L (ref 38–126)
Anion gap: 10 (ref 5–15)
BUN: 11 mg/dL (ref 6–20)
CO2: 27 mmol/L (ref 22–32)
Calcium: 9.2 mg/dL (ref 8.9–10.3)
Chloride: 104 mmol/L (ref 98–111)
Creatinine, Ser: 0.97 mg/dL (ref 0.44–1.00)
GFR calc Af Amer: >60 mL/min (ref >60)
GFR calc non Af Amer: >60 mL/min (ref >60)
Glucose, Bld: 130 mg/dL — ABNORMAL HIGH (ref 70–99)
Potassium: 3.4 mmol/L — ABNORMAL LOW (ref 3.5–5.1)
Sodium: 141 mmol/L (ref 135–145)
Total Bilirubin: 0.4 mg/dL (ref 0.3–1.2)
Total Protein: 8 g/dL (ref 6.5–8.1)

## 2020-06-10 LAB — CBC WITH DIFFERENTIAL/PLATELET
Abs Immature Granulocytes: 0.01 10*3/uL (ref 0.00–0.07)
Basophils Absolute: 0.1 10*3/uL (ref 0.0–0.1)
Basophils Relative: 1 %
Eosinophils Absolute: 0.1 10*3/uL (ref 0.0–0.5)
Eosinophils Relative: 2 %
HCT: 39.5 % (ref 36.0–46.0)
Hemoglobin: 12.7 g/dL (ref 12.0–15.0)
Immature Granulocytes: 0 %
Lymphocytes Relative: 49 %
Lymphs Abs: 2.6 10*3/uL (ref 0.7–4.0)
MCH: 27.3 pg (ref 26.0–34.0)
MCHC: 32.2 g/dL (ref 30.0–36.0)
MCV: 84.9 fL (ref 80.0–100.0)
Monocytes Absolute: 0.4 10*3/uL (ref 0.1–1.0)
Monocytes Relative: 7 %
Neutro Abs: 2.2 10*3/uL (ref 1.7–7.7)
Neutrophils Relative %: 41 %
Platelets: 263 10*3/uL (ref 150–400)
RBC: 4.65 MIL/uL (ref 3.87–5.11)
RDW: 14.1 % (ref 11.5–15.5)
WBC: 5.3 10*3/uL (ref 4.0–10.5)
nRBC: 0 % (ref 0.0–0.2)

## 2020-06-10 LAB — IRON AND TIBC
Iron: 58 ug/dL (ref 28–170)
Saturation Ratios: 16 % (ref 10.4–31.8)
TIBC: 367 ug/dL (ref 250–450)
UIBC: 309 ug/dL

## 2020-06-10 LAB — FERRITIN: Ferritin: 29 ng/mL (ref 11–307)

## 2020-06-10 LAB — FOLATE: Folate: 8.9 ng/mL (ref >5.9)

## 2020-06-10 MED ORDER — ALBUTEROL SULFATE 108 (90 BASE) MCG/ACT IN AEPB: 2.0000 | INHALATION_SPRAY | Freq: Four times a day (QID) | RESPIRATORY_TRACT | 1 refills | Status: DC | PRN

## 2020-06-10 MED ORDER — BUDESONIDE-FORMOTEROL FUMARATE 160-4.5 MCG/ACT IN AERO: 2.0000 | INHALATION_SPRAY | Freq: Two times a day (BID) | RESPIRATORY_TRACT | 1 refills | Status: DC

## 2020-06-10 MED ORDER — PREDNISONE 10 MG (21) PO TBPK
ORAL_TABLET | ORAL | 0 refills | Status: DC
Start: 2020-06-10 — End: 2020-07-16

## 2020-06-10 NOTE — Telephone Encounter (Signed)
Okay to prescribe per Dr. Jayme Cloud.  Rx sent to preferred pharmacy.  Nothing further is needed at this time.

## 2020-06-10 NOTE — Telephone Encounter (Signed)
Pt is aware of below message and voiced her understanding.  Both symbicort and prednisone show reactions of swelling, palpations and sob. Pt stated that she has taken both prednisone and symbicort previously without reaction.   Dr. Jayme Cloud please advise if okay to order?

## 2020-06-11 ENCOUNTER — Encounter: Payer: Self-pay | Admitting: *Deleted

## 2020-06-14 ENCOUNTER — Ambulatory Visit: Payer: Medicare HMO | Admitting: Cardiology

## 2020-06-16 ENCOUNTER — Telehealth: Payer: Self-pay | Admitting: *Deleted

## 2020-06-16 DIAGNOSIS — I509 Heart failure, unspecified: Secondary | ICD-10-CM | POA: Diagnosis not present

## 2020-06-16 NOTE — Telephone Encounter (Signed)
Left vm for patient to return call to discuss lab results.

## 2020-06-18 ENCOUNTER — Ambulatory Visit: Payer: Medicare HMO | Admitting: Cardiology

## 2020-06-18 DIAGNOSIS — I509 Heart failure, unspecified: Secondary | ICD-10-CM | POA: Diagnosis not present

## 2020-06-21 ENCOUNTER — Encounter: Payer: Self-pay | Admitting: Cardiology

## 2020-06-21 DIAGNOSIS — I509 Heart failure, unspecified: Secondary | ICD-10-CM | POA: Diagnosis not present

## 2020-06-22 NOTE — Progress Notes (Deleted)
Subjective:    Patient ID: Andrea Miller, female    DOB: Aug 27, 1986, 34 y.o.   MRN: [id number]  HPI  Andrea Miller is a 34 y/o female with a history of asthma, HTN, SVT, obesity, bipolar, anxiety, depression, fibromyalgia, seizures, bulging disc and chronic heart failure.   Echo report from 09/16/18 reviewed and showed an EF of 55%. Echo report from 07/04/18 reviewed and showed an EF of 73%. Echo report from 04/15/17 reviewed and showed an EF of 60-65%.  Was in the ED 06/08/20 due to shortness of breath. V/Q scan negative for PE. Patient was released.   She presents today for a follow-up visit with a chief complaint of   Past Medical History:  Diagnosis Date  . Anxiety   . Asthma   . Bipolar disorder (HCC)   . Chest pain    and angina  . CHF (congestive heart failure) (HCC)   . Depression   . Fibromyalgia   . Hypertension   . 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 Obesity   . Pre-diabetes   . Pulmonary embolus (HCC)   . Seizures (HCC)    secondary to anxiety  last seizure 01/25/18  . SVT (supraventricular tachycardia) (HCC)    with hx of syncope; managed by [facility]   Past Surgical History:  Procedure Laterality Date  . CHOLECYSTECTOMY N/A 02/22/2018   Procedure: LAPAROSCOPIC CHOLECYSTECTOMY;  Surgeon: Ancil Linsey, MD;  Location: [facility] ORS;  Service: General;  Laterality: N/A;  . TONSILLECTOMY AND ADENOIDECTOMY N/A 09/05/2016   Procedure: TONSILLECTOMY AND ADENOIDECTOMY;  Surgeon: Linus Salmons, MD;  Location: [facility] ORS;  Service: ENT;  Laterality: N/A;   Family History  Problem Relation Age of Onset  . Diabetes Mother   . Hypertension Mother   . Asthma Mother   . Gallstones Mother   . Diabetes Father   . Hypertension Father   . Gallstones Father   . Bipolar disorder Sister   . COPD Brother   . Schizophrenia Brother   . COPD Brother   . Hypertension Brother    Social History   Tobacco Use  . Smoking status: Never Smoker  .  Smokeless tobacco: Never Used  Substance Use Topics  . Alcohol use: No   Allergies  Allergen Reactions  . Cortizone-10 [Hydrocortisone] Shortness Of Breath and Swelling  . Garlic Anaphylaxis  . Prednisone Swelling    Tongue swelling  . Contrast Media [Iodinated Diagnostic Agents]     Pt reports that "every time" she receives contrast she "can't breathe" for a short time. At time of arrival to room after CT scan, she is breathing normally and VS WNL.  Pt states that she thought not breathing after contrast for a little while was normal. When asked if allergic or has any reaction to IV contrast patient stated no. LP   . Corticosteroids Palpitations      Review of Systems  Constitutional: Positive for fatigue. Negative for appetite change.  HENT: Negative for congestion, postnasal drip and sore throat.   Eyes: Positive for visual disturbance (blurry vision right eye).  Respiratory: Positive for chest tightness and shortness of breath ("better"). Negative for cough.   Cardiovascular: Positive for chest pain (intermittent) and leg swelling ("better"). Negative for palpitations.  Gastrointestinal: Negative for abdominal distention and abdominal pain.  Endocrine: Negative.   Genitourinary: Negative.   Musculoskeletal: Positive for arthralgias (leg pain) and back pain (due to bulging disc).  Skin: Negative.  Allergic/Immunologic: Negative.   Neurological: Positive for light-headedness. Negative for dizziness and headaches.  Hematological: Negative for adenopathy. Does not bruise/bleed easily.  Psychiatric/Behavioral: Positive for dysphoric mood. Negative for sleep disturbance (wearing CPAP at night). The patient is not nervous/anxious.      Objective:   Physical Exam Vitals and nursing note reviewed.  Constitutional:      Appearance: She is well-developed.  HENT:     Head: Normocephalic and atraumatic.  Neck:     Vascular: No JVD.  Cardiovascular:     Rate and Rhythm: Normal  rate and regular rhythm.  Pulmonary:     Effort: Pulmonary effort is normal. No respiratory distress.     Breath sounds: No wheezing or rales.  Abdominal:     General: There is no distension.     Palpations: Abdomen is soft.  Musculoskeletal:     Cervical back: Normal range of motion and neck supple.     Right lower leg: No tenderness. No edema.     Left lower leg: No tenderness. No edema.  Skin:    General: Skin is warm and dry.  Neurological:     Mental Status: She is alert and oriented to person, place, and time.  Psychiatric:        Behavior: Behavior normal.       Assessment & Plan:   1: Chronic heart failure with preserved ejection fraction- - NYHA class II - euvolemic today - not weighing daily as she's waiting on her scale from [facility] to arrive; reminded to call for an overnight weight gain of >2 pounds or a weekly weight gain of >5 pounds - weight 357.2 pounds from last visit here 7 months ago - not adding salt and occasionally reads food labels; reviewed the importance of closely following a 2000mg  sodium diet  - says that she's eating "a lot" of ice - saw cardiology ) 01/14/20 - drinking ~ 64 ounces of fluid daily - BNP 03/12/20 was 18.0   2: HTN- - BP  - follows with PCP (Aycock) at 03/14/20  - BMP 06/10/20 reviewed and showed sodium 141, potassium 3.4, creatinine 0.97 and GFR >60  3: Obstructive sleep apnea- - wearing CPAP nightly and feels like she's sleeping well - saw pulmonologist 06/12/20) 09/23/2019  4: Lymphedema- - stage 2 - resolved - continues to wear compression boots on occasion   Patient did not bring her medications nor a list and does not know what she's taking. Says that her medications are bubble packed and she was instructed to bring them with her next week when she returns.

## 2020-06-23 ENCOUNTER — Telehealth: Payer: Self-pay | Admitting: Family

## 2020-06-23 ENCOUNTER — Ambulatory Visit: Payer: Medicare HMO | Admitting: Family

## 2020-06-23 DIAGNOSIS — S8002XA Contusion of left knee, initial encounter: Secondary | ICD-10-CM | POA: Diagnosis not present

## 2020-06-23 DIAGNOSIS — G629 Polyneuropathy, unspecified: Secondary | ICD-10-CM | POA: Diagnosis not present

## 2020-06-23 NOTE — Telephone Encounter (Signed)
Patient did not show for her [facility] appointment on 06/23/20. Will attempt to reschedule.

## 2020-06-24 DIAGNOSIS — Z87898 Personal history of other specified conditions: Secondary | ICD-10-CM | POA: Diagnosis not present

## 2020-06-24 DIAGNOSIS — R55 Syncope and collapse: Secondary | ICD-10-CM | POA: Diagnosis not present

## 2020-06-25 DIAGNOSIS — M4716 Other spondylosis with myelopathy, lumbar region: Secondary | ICD-10-CM | POA: Diagnosis not present

## 2020-06-25 DIAGNOSIS — G8929 Other chronic pain: Secondary | ICD-10-CM | POA: Diagnosis not present

## 2020-06-25 DIAGNOSIS — G894 Chronic pain syndrome: Secondary | ICD-10-CM | POA: Diagnosis not present

## 2020-06-25 DIAGNOSIS — I509 Heart failure, unspecified: Secondary | ICD-10-CM | POA: Diagnosis not present

## 2020-06-25 DIAGNOSIS — M545 Low back pain: Secondary | ICD-10-CM | POA: Diagnosis not present

## 2020-06-25 DIAGNOSIS — M5126 Other intervertebral disc displacement, lumbar region: Secondary | ICD-10-CM | POA: Diagnosis not present

## 2020-06-25 DIAGNOSIS — M5416 Radiculopathy, lumbar region: Secondary | ICD-10-CM | POA: Diagnosis not present

## 2020-06-28 DIAGNOSIS — I509 Heart failure, unspecified: Secondary | ICD-10-CM | POA: Diagnosis not present

## 2020-06-30 DIAGNOSIS — I509 Heart failure, unspecified: Secondary | ICD-10-CM | POA: Diagnosis not present

## 2020-06-30 DIAGNOSIS — J45909 Unspecified asthma, uncomplicated: Secondary | ICD-10-CM | POA: Diagnosis not present

## 2020-07-03 DIAGNOSIS — I509 Heart failure, unspecified: Secondary | ICD-10-CM | POA: Diagnosis not present

## 2020-07-05 DIAGNOSIS — I509 Heart failure, unspecified: Secondary | ICD-10-CM | POA: Diagnosis not present

## 2020-07-06 ENCOUNTER — Inpatient Hospital Stay: Payer: Medicare HMO | Attending: Hematology and Oncology

## 2020-07-07 ENCOUNTER — Inpatient Hospital Stay: Payer: Medicare HMO

## 2020-07-08 ENCOUNTER — Ambulatory Visit: Payer: Medicare HMO | Admitting: Pulmonary Disease

## 2020-07-08 ENCOUNTER — Inpatient Hospital Stay: Payer: Medicare HMO

## 2020-07-09 DIAGNOSIS — I509 Heart failure, unspecified: Secondary | ICD-10-CM | POA: Diagnosis not present

## 2020-07-10 DIAGNOSIS — I509 Heart failure, unspecified: Secondary | ICD-10-CM | POA: Diagnosis not present

## 2020-07-16 ENCOUNTER — Encounter: Payer: Self-pay | Admitting: Primary Care

## 2020-07-16 ENCOUNTER — Other Ambulatory Visit: Payer: Self-pay | Admitting: Pulmonary Disease

## 2020-07-16 ENCOUNTER — Other Ambulatory Visit: Payer: Self-pay

## 2020-07-16 ENCOUNTER — Ambulatory Visit (INDEPENDENT_AMBULATORY_CARE_PROVIDER_SITE_OTHER): Payer: Medicare HMO | Admitting: Primary Care

## 2020-07-16 DIAGNOSIS — J4521 Mild intermittent asthma with (acute) exacerbation: Secondary | ICD-10-CM

## 2020-07-16 DIAGNOSIS — R69 Illness, unspecified: Secondary | ICD-10-CM | POA: Diagnosis not present

## 2020-07-16 MED ORDER — MONTELUKAST SODIUM 10 MG PO TABS: 10.0000 mg | ORAL_TABLET | Freq: Every day | ORAL | 1 refills | Status: AC

## 2020-07-16 MED ORDER — BUDESONIDE-FORMOTEROL FUMARATE 160-4.5 MCG/ACT IN AERO: 2.0000 | INHALATION_SPRAY | Freq: Two times a day (BID) | RESPIRATORY_TRACT | 3 refills | Status: DC

## 2020-07-16 MED ORDER — SPIRIVA RESPIMAT 1.25 MCG/ACT IN AERS: 2.0000 | INHALATION_SPRAY | Freq: Every day | RESPIRATORY_TRACT | 0 refills | Status: DC

## 2020-07-16 MED ORDER — FLUTICASONE PROPIONATE 50 MCG/ACT NA SUSP: 2.0000 | Freq: Every day | NASAL | 1 refills | Status: AC | PRN

## 2020-07-16 NOTE — Progress Notes (Signed)
Virtual Visit via Telephone Note  I connected with Andrea Miller on 07/16/20 at  3:30 PM EDT by telephone and verified that I am speaking with the correct person using two identifiers.  Location: Patient: Home Provider: Office   I discussed the limitations, risks, security and privacy concerns of performing an evaluation and management service by telephone and the availability of in person appointments. I also discussed with the patient that there may be a patient responsible charge related to this service. The patient expressed understanding and agreed to proceed.   History of Present Illness: 34 year old female, never smoked.  Past medical history significant for asthma, bipolar disorder, CHF (EF 55 to 60% in 2019) hypertension, obesity, PE (on Xarelto, not compliant recently), seizures and SVT.Andrea Miller  Patient of Dr. Jayme Cloud, last seen in office on 09/23/2019.  She was recently seen at [facility] emergency department on 06/08/2020 for shortness of breath.  VQ scan was negative. Dopplers negative for DVT. Patient was discharged home with close follow-up with PCP.   07/16/2020 Patient contacted today for follow-up televisit. She reports increased shortness of breath and wheezing with heat/humidity. She reports compliance with Symbicort 160 two puffs twice daily and Singulair. She uses her albuterol hfa/nebulizer as needed 2-3 times a day. She needs medications refilled today. She has allergy to oral prednisone. Denies fever, chills, productive cough.    Observations/Objective:  - Able to speak in full sentences without overt shortness of breath, wheezing or cough   Assessment and Plan:  Asthma: - Increased shortness of breath and wheezing with heat/humidity  - Spirometry in 2018 showed severe restriction. - Continue Symbicort 160 two puffs twice daily; Singulair 10mg  at bedtime and prn albuterol q 6 hrs - Trial Spiriva 1.30mcg two puffs once daily  - Advised patient to stay indoors on hot days with  [facility] and windows closed if possible  - Holding of on oral steriods d/t reports allergy, if needed would recommend lower dose   Follow Up Instructions:   - FU in 1-2 weeks if not improvement  I discussed the assessment and treatment plan with the patient. The patient was provided an opportunity to ask questions and all were answered. The patient agreed with the plan and demonstrated an understanding of the instructions.   The patient was advised to call back or seek an in-person evaluation if the symptoms worsen or if the condition fails to improve as anticipated.  I provided 18 minutes of non-face-to-face time during this encounter.   [facility], NP

## 2020-07-16 NOTE — Patient Instructions (Addendum)
Recommend: Symbicort 160 two puffs twice daily (risne mouth after use) Trial Spiriva 1.58mcg -  Two puffs once daily in morning  Refill Flonase   Follow-up: 1-2 weeks if symptoms do not improve

## 2020-07-19 ENCOUNTER — Other Ambulatory Visit: Payer: Self-pay | Admitting: Pulmonary Disease

## 2020-07-19 ENCOUNTER — Ambulatory Visit: Payer: Self-pay | Admitting: Dermatology

## 2020-07-31 DIAGNOSIS — J45909 Unspecified asthma, uncomplicated: Secondary | ICD-10-CM | POA: Diagnosis not present

## 2020-08-02 DIAGNOSIS — I509 Heart failure, unspecified: Secondary | ICD-10-CM | POA: Diagnosis not present

## 2020-08-03 ENCOUNTER — Ambulatory Visit
Admission: EM | Admit: 2020-08-03 | Discharge: 2020-08-03 | Disposition: A | Discharge status: Home / Self Care | Payer: Medicare HMO | Attending: Family Medicine | Admitting: Family Medicine

## 2020-08-03 ENCOUNTER — Encounter: Payer: Self-pay | Admitting: Emergency Medicine

## 2020-08-03 ENCOUNTER — Other Ambulatory Visit: Payer: Self-pay

## 2020-08-03 ENCOUNTER — Ambulatory Visit (INDEPENDENT_AMBULATORY_CARE_PROVIDER_SITE_OTHER): Payer: Medicare HMO

## 2020-08-03 DIAGNOSIS — Z20822 Contact with and (suspected) exposure to covid-19: Secondary | ICD-10-CM | POA: Insufficient documentation

## 2020-08-03 DIAGNOSIS — J029 Acute pharyngitis, unspecified: Secondary | ICD-10-CM | POA: Diagnosis not present

## 2020-08-03 DIAGNOSIS — R0602 Shortness of breath: Secondary | ICD-10-CM | POA: Diagnosis not present

## 2020-08-03 DIAGNOSIS — R112 Nausea with vomiting, unspecified: Secondary | ICD-10-CM | POA: Diagnosis not present

## 2020-08-03 DIAGNOSIS — R52 Pain, unspecified: Secondary | ICD-10-CM

## 2020-08-03 DIAGNOSIS — I517 Cardiomegaly: Secondary | ICD-10-CM | POA: Diagnosis not present

## 2020-08-03 LAB — CBC WITH DIFFERENTIAL/PLATELET
Abs Immature Granulocytes: 0.01 10*3/uL (ref 0.00–0.07)
Basophils Absolute: 0 10*3/uL (ref 0.0–0.1)
Basophils Relative: 1 %
Eosinophils Absolute: 0.1 10*3/uL (ref 0.0–0.5)
Eosinophils Relative: 2 %
HCT: 40.4 % (ref 36.0–46.0)
Hemoglobin: 12.7 g/dL (ref 12.0–15.0)
Immature Granulocytes: 0 %
Lymphocytes Relative: 46 %
Lymphs Abs: 2 10*3/uL (ref 0.7–4.0)
MCH: 26.7 pg (ref 26.0–34.0)
MCHC: 31.4 g/dL (ref 30.0–36.0)
MCV: 84.9 fL (ref 80.0–100.0)
Monocytes Absolute: 0.4 10*3/uL (ref 0.1–1.0)
Monocytes Relative: 9 %
Neutro Abs: 1.8 10*3/uL (ref 1.7–7.7)
Neutrophils Relative %: 42 %
Platelets: 226 10*3/uL (ref 150–400)
RBC: 4.76 MIL/uL (ref 3.87–5.11)
RDW: 14 % (ref 11.5–15.5)
WBC: 4.2 10*3/uL (ref 4.0–10.5)
nRBC: 0 % (ref 0.0–0.2)

## 2020-08-03 LAB — COMPREHENSIVE METABOLIC PANEL
ALT: 9 U/L (ref 0–44)
AST: 15 U/L (ref 15–41)
Albumin: 3.7 g/dL (ref 3.5–5.0)
Alkaline Phosphatase: 67 U/L (ref 38–126)
Anion gap: 11 (ref 5–15)
BUN: 8 mg/dL (ref 6–20)
CO2: 25 mmol/L (ref 22–32)
Calcium: 8.9 mg/dL (ref 8.9–10.3)
Chloride: 105 mmol/L (ref 98–111)
Creatinine, Ser: 0.74 mg/dL (ref 0.44–1.00)
GFR calc Af Amer: >60 mL/min (ref >60)
GFR calc non Af Amer: >60 mL/min (ref >60)
Glucose, Bld: 105 mg/dL — ABNORMAL HIGH (ref 70–99)
Potassium: 3.7 mmol/L (ref 3.5–5.1)
Sodium: 141 mmol/L (ref 135–145)
Total Bilirubin: 0.2 mg/dL — ABNORMAL LOW (ref 0.3–1.2)
Total Protein: 7.2 g/dL (ref 6.5–8.1)

## 2020-08-03 MED ORDER — ONDANSETRON 8 MG PO TBDP
8.0000 mg | ORAL_TABLET | Freq: Once | ORAL | Status: AC
  Administered 2020-08-03: 8 mg via ORAL

## 2020-08-03 MED ORDER — ONDANSETRON 8 MG PO TBDP: 8.0000 mg | ORAL_TABLET | Freq: Three times a day (TID) | ORAL | 0 refills | Status: DC | PRN

## 2020-08-03 MED ORDER — KETOROLAC TROMETHAMINE 60 MG/2ML IM SOLN
60.0000 mg | Freq: Once | INTRAMUSCULAR | Status: AC
  Administered 2020-08-03: 60 mg via INTRAMUSCULAR

## 2020-08-03 NOTE — ED Triage Notes (Signed)
Patient in today c/o nasal congestion, sore throat, sob, nausea, generalized body aches and swelling in ankles and feet x 2 days. Patient has had the [employer] vaccine, but isn't going to take the 2nd one due to side effects. Patient has taken OTC Ibuprofen yesterday.

## 2020-08-03 NOTE — ED Provider Notes (Signed)
[facility]    CSN: [id number] Arrival date & time: 08/03/20  1351      History   Chief Complaint Chief Complaint  Patient presents with  . Nasal Congestion  . Sore Throat  . Shortness of Breath  . Nausea  . Generalized Body Aches    HPI Andrea Miller is a 34 y.o. female who presents with nose congestion, SOB with activity, ST, nausea, body aches, her lower legs feel tight and swollen. Has missed school and needs a note. Has not been checking her glucose. Has been taking her BP meds qd. Has vomited x 6 times today. Her body aches are so bad, that she cant hardly move.  She only received one  the Pfyzer covid injection due to side effects.  LMP- 1 week ago  Past Medical History:  Diagnosis Date  . Anxiety   . Asthma   . Bipolar disorder (HCC)   . Chest pain    and angina  . CHF (congestive heart failure) (HCC)   . Depression   . Fibromyalgia   . Hypertension   . 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 Obesity   . Pre-diabetes   . Pulmonary embolus (HCC)   . Seizures (HCC)    secondary to anxiety  last seizure 01/25/18  . SVT (supraventricular tachycardia) (HCC)    with hx of syncope; managed by [facility]    Patient Active Problem List   Diagnosis Date Noted  . Chronic anticoagulation 05/07/2019  . Iron deficiency anemia due to chronic blood loss 10/15/2018  . B12 deficiency 10/13/2018  . Symptomatic anemia 10/11/2018  . Multiple subsegmental pulmonary emboli without acute cor pulmonale (HCC) 09/15/2018  . Morbid obesity with BMI of 50.0-59.9, adult (HCC) 09/15/2018  . Chronic diastolic heart failure (HCC) 07/01/2018  . Lymphedema 07/01/2018  . Chronic cholecystitis   . Edema 02/13/2018  . Diabetes mellitus type 2, uncomplicated (HCC) 02/13/2018  . Hypertension 11/23/2017  . Asthma exacerbation 04/14/2017  . Asthma without status asthmaticus 01/16/2017  . Bipolar affective (HCC) 01/16/2017  . Coronary artery  disease 01/16/2017  . Syncope 12/12/2016  . Fibromyalgia 04/27/2014  . OSA (obstructive sleep apnea) 04/27/2014  . Seizure-like activity (HCC) 04/27/2014  . Migraine without status migrainosus, not intractable 04/27/2014  . Obesity 04/15/2014  . Sleep disorder 04/15/2014  . Neck pain 04/15/2014  . Headache 04/15/2014  . Restless legs syndrome 04/15/2014  . Supraventricular tachycardia (HCC) 07/13/2009    Past Surgical History:  Procedure Laterality Date  . CHOLECYSTECTOMY N/A 02/22/2018   Procedure: LAPAROSCOPIC CHOLECYSTECTOMY;  Surgeon: Ancil Linsey, MD;  Location: [facility] ORS;  Service: General;  Laterality: N/A;  . TONSILLECTOMY AND ADENOIDECTOMY N/A 09/05/2016   Procedure: TONSILLECTOMY AND ADENOIDECTOMY;  Surgeon: Linus Salmons, MD;  Location: [facility] ORS;  Service: ENT;  Laterality: N/A;    OB History    Gravida  0   Para  0   Term  0   Preterm  0   AB  0   Living  0     SAB  0   TAB  0   Ectopic  0   Multiple  0   Live Births               Home Medications    Prior to Admission medications   Medication Sig Start Date End Date Taking? Authorizing Provider  Adapalene 0.3 % gel Apply 1 application topically at bedtime.  10/20/19  Yes [provider]  albuterol (PROVENTIL) (2.5 MG/3ML) 0.083% nebulizer solution Take 3 mLs (2.5 mg total) by nebulization every 4 (four) hours as needed for wheezing or shortness of breath. 09/23/19 08/03/20 Yes Salena Saner, MD  budesonide-formoterol [facility]) 160-4.5 MCG/ACT inhaler Inhale 2 puffs into the lungs 2 (two) times daily. 07/16/20 08/15/20 Yes Glenford Bayley, NP  buprenorphine (SUBUTEX) 2 MG SUBL SL tablet SMARTSIG:0.5 Tablet(s) Sublingual Twice Daily PRN 03/12/20  Yes [provider]  busPIRone (BUSPAR) 10 MG tablet Take 10 mg by mouth 2 (two) times daily.  10/22/19  Yes [provider]  Calcium-Magnesium 500-250 MG TABS Take 1 tablet by mouth 2 (two) times daily.    Yes  [provider]  clonazePAM (KLONOPIN) 1 MG tablet Take 1 tablet by mouth 2 (two) times a day. 04/02/19  Yes [provider]  cyanocobalamin (,VITAMIN B-12,) 1000 MCG/ML injection Inject 1,000 mcg into the muscle every 30 (thirty) days.   Yes [provider]  Dexlansoprazole (DEXILANT) 30 MG capsule Take 30 mg by mouth daily.   Yes [provider]  diclofenac Sodium (VOLTAREN) 1 % GEL Apply 2 g topically 4 (four) times daily. 03/12/20  Yes Dartha Lodge, PA-C  diltiazem (CARDIZEM CD) 360 MG 24 hr capsule Take 360 mg by mouth every morning. 12/18/18  Yes [provider]  ELIDEL 1 % cream Apply to aa's eczema QD-BID PRN 01/20/20  Yes [provider]  escitalopram (LEXAPRO) 20 MG tablet Take 20 mg by mouth daily.   Yes [provider]  fluticasone (FLONASE) 50 MCG/ACT nasal spray Place 2 sprays into both nostrils daily as needed for allergies. 07/16/20  Yes Glenford Bayley, NP  folic acid (FOLVITE) 1 MG tablet Take 1 mg by mouth daily.  12/15/16  Yes [provider]  furosemide (LASIX) 20 MG tablet Take 40 mg by mouth 2 (two) times daily.    Yes [provider]  gabapentin (NEURONTIN) 400 MG capsule Take 400 mg by mouth 3 (three) times daily.   Yes [provider]  hydroxypropyl methylcellulose / hypromellose (ISOPTO TEARS / GONIOVISC) 2.5 % ophthalmic solution Place 1 drop into both eyes 3 (three) times daily as needed for dry eyes.   Yes [provider]  hydrOXYzine (ATARAX/VISTARIL) 10 MG tablet Take 10 mg by mouth 2 (two) times daily. 11/18/19  Yes [provider]  Iron-Vitamin C 65-125 MG TABS Take 1 tablet by mouth 2 (two) times daily. 09/19/19  Yes Corcoran, Ferdie Ping, MD  isosorbide mononitrate (IMDUR) 30 MG 24 hr tablet Take 30 mg by mouth daily. 03/08/20  Yes [provider]  ketoconazole (NIZORAL) 2 % shampoo Apply 1 application topically every 30 (thirty) days.  10/01/18  Yes  [provider]  lactulose (CHRONULAC) 10 GM/15ML solution Take 30 g by mouth daily as needed for mild constipation.  08/28/18  Yes [provider]  lamoTRIgine (LAMICTAL) 200 MG tablet Take 1 tablet by mouth 2 (two) times a day. 05/20/19  Yes [provider]  linaclotide (LINZESS) 290 MCG CAPS capsule Take 290 mcg by mouth daily before breakfast.   Yes [provider]  montelukast (SINGULAIR) 10 MG tablet Take 1 tablet (10 mg total) by mouth at bedtime. 07/16/20  Yes Glenford Bayley, NP  Potassium Chloride ER 20 MEQ TBCR Take 20 mEq by mouth 2 (two) times daily.    Yes [provider]  PROAIR RESPICLICK 108 (90 Base) MCG/ACT AEPB INHALE 2 PUFFS  INTO THE LUNGS EVERY 6 HOURS AS NEEDED. 07/16/20  Yes Salena Saner, MD  propranolol (INDERAL) 40 MG tablet Take 2 tablets (80 mg total) by mouth 3 (three) times daily. 10/13/18  Yes Salary, Montell D, MD  risperiDONE (RISPERDAL) 3 MG tablet Take 3 mg by mouth at bedtime.    Yes [provider]  rivaroxaban (XARELTO) 20 MG TABS tablet Take 1 tablet (20 mg total) by mouth every morning. 04/29/20  Yes Corcoran, Ferdie Ping, MD  rosuvastatin (CRESTOR) 20 MG tablet Take 20 mg by mouth daily.   Yes [provider]  Tiotropium Bromide Monohydrate (SPIRIVA RESPIMAT) 1.25 MCG/ACT AERS Inhale 2 puffs into the lungs daily. 07/16/20 08/18/20 Yes Glenford Bayley, NP  Topiramate ER (TROKENDI XR) 200 MG CP24 Take 1 capsule by mouth daily.    Yes [provider]  vitamin C (VITAMIN C) 250 MG tablet Take 1 tablet (250 mg total) by mouth 2 (two) times daily. 10/13/18  Yes Salary, Evelena Asa, MD  ondansetron (ZOFRAN ODT) 8 MG disintegrating tablet Take 1 tablet (8 mg total) by mouth every 8 (eight) hours as needed for nausea or vomiting. 08/03/20   Rodriguez-Southworth, Nettie Elm, PA-C  dicyclomine (BENTYL) 10 MG capsule Take 1 capsule (10 mg total) by mouth every 6 (six) hours as needed for up to 7 days for  spasms (abdominal pain). 04/18/20 08/03/20  Sharman Cheek, MD    Family History Family History  Problem Relation Age of Onset  . Diabetes Mother   . Hypertension Mother   . Asthma Mother   . Gallstones Mother   . Diabetes Father   . Hypertension Father   . Gallstones Father   . Bipolar disorder Sister   . COPD Brother   . Schizophrenia Brother   . COPD Brother   . Hypertension Brother     Social History Social History   Tobacco Use  . Smoking status: Never Smoker  . Smokeless tobacco: Never Used  Vaping Use  . Vaping Use: Never used  Substance Use Topics  . Alcohol use: No  . Drug use: No     Allergies   Cortizone-10 [hydrocortisone], Garlic, Prednisone, Contrast media [iodinated diagnostic agents], and Corticosteroids   Review of Systems Review of Systems  Constitutional: Positive for activity change, appetite change, diaphoresis and fatigue. Negative for fever.  HENT: Positive for congestion, ear pain, postnasal drip and rhinorrhea. Negative for ear discharge and sore throat.   Eyes: Negative for discharge.  Respiratory: Positive for cough, shortness of breath and wheezing.   Gastrointestinal: Positive for nausea and vomiting.  Musculoskeletal: Positive for myalgias.  Skin: Negative for rash.  Neurological: Positive for weakness and headaches.     Physical Exam Triage Vital Signs ED Triage Vitals  Enc Vitals Group     BP 08/03/20 1422 (!) 163/105     Pulse Rate 08/03/20 1422 81     Resp 08/03/20 1422 18     Temp 08/03/20 1422 99.1 F (37.3 C)     Temp Source 08/03/20 1422 Oral     SpO2 08/03/20 1422 100 %     Weight 08/03/20 1424 (!) 320 lb (145.2 kg)     Height 08/03/20 1424  (1.778 m)     Head Circumference --      Peak Flow --      Pain Score 08/03/20 1422 10     Pain Loc --      Pain Edu? --      Excl.  in GC? --    No data found.  Updated Vital Signs BP (!) 163/105 (BP Location: Right Arm) Comment (BP Location): lower forearm   Pulse 81   Temp 99.1 F (37.3 C) (Oral)   Resp 18   Ht 5\' 10"  (1.778 m)   Wt (!) 320 lb (145.2 kg)   LMP 07/27/2020 (Approximate)   SpO2 99% Comment: with ambulation  BMI 45.92 kg/m   Visual Acuity Right Eye Distance:   Left Eye Distance:   Bilateral Distance:    Right Eye Near:   Left Eye Near:    Bilateral Near:     Physical Exam Constitutional:      Appearance: She is obese. She is not toxic-appearing or diaphoretic.    Physical Exam Vitals signs and nursing note reviewed.  Constitutional:      General: She looks like she does not fell well    Appearance: Normal appearance.    Head: Normocephalic.     Right Ear: Tympanic membrane, ear canal and external ear normal.     Left Ear: Tympanic membrane, ear canal and external ear normal.     Nose: Nose normal.     Mouth/Throat: clear    Mouth: Mucous membranes are moist.  Eyes:     General: No scleral icterus.       Right eye: No discharge.        Left eye: No discharge.     Conjunctiva/sclera: Conjunctivae normal.  Neck:     Musculoskeletal: Neck supple. No neck rigidity.  Cardiovascular:     Rate and Rhythm: Normal rate and regular rhythm.     Heart sounds: No murmur.  Pulmonary:     Effort: Pulmonary effort is normal.     Breath sounds: Normal breath sounds.  Abdominal:     General: Bowel sounds are normal. There is no distension.     Palpations: Abdomen is soft. There is no mass.     Tenderness: There is no abdominal tenderness. There is no guarding or rebound.     Hernia: No hernia is present.  Musculoskeletal: Normal range of motion. No pitting edema noted.  Lymphadenopathy:     Cervical: No cervical adenopathy.  Skin:    General: Skin is warm and dry.     Coloration: Skin is not jaundiced.     Findings: No rash.  Neurological:     Mental Status: She is alert and oriented to person, place, and time.     Gait: Gait normal.  Psychiatric:        Mood and Affect: Mood normal.        Behavior: Behavior  normal.        Thought Content: Thought content normal.        Judgment: Judgment normal.   UC Treatments / Results  Labs (all labs ordered are listed, but only abnormal results are displayed) Labs Reviewed  COMPREHENSIVE METABOLIC PANEL - Abnormal; Notable for the following components:      Result Value   Glucose, Bld 105 (*)    Total Bilirubin 0.2 (*)    All other components within normal limits  SARS CORONAVIRUS 2 (TAT 6-24 HRS)  CBC WITH DIFFERENTIAL/PLATELET    EKG   Radiology DG Chest 2 View  Result Date: 08/03/2020 CLINICAL DATA:  Shortness of breath, sore throat, and body aches. EXAM: CHEST - 2 VIEW COMPARISON:  Chest x-ray dated Apr 18, 2020. FINDINGS: Mild cardiomegaly. Low lung volumes are present, causing crowding of the pulmonary  vasculature. No focal consolidation, pleural effusion, or pneumothorax. No acute osseous abnormality. IMPRESSION: 1. Mild cardiomegaly and low lung volumes. No active cardiopulmonary disease. Electronically Signed   By: Obie Dredge M.D.   On: 08/03/2020 15:26    Procedures Procedures (including critical care time)  Medications Ordered in UC Medications  ketorolac (TORADOL) injection 60 mg (60 mg Intramuscular Given 08/03/20 1546)  ondansetron (ZOFRAN-ODT) disintegrating tablet 8 mg (8 mg Oral Given 08/03/20 1532)    Initial Impression / Assessment and Plan / UC Course  I have reviewed the triage vital signs and the nursing notes. I suspect she has viral illness.  Ambulatory pulse ox was 99%. She was given Toradol 60 mg IM for body aches which helped her move better,  Covid test is pending. She improved with Zoran dose given here, and more sent for home.  Pertinent labs & imaging results that were available during my care of the patient were reviewed by me and considered in my medical decision making (see chart for details). Final Clinical Impressions(s) / UC Diagnoses   Final diagnoses:  Suspected COVID-19 virus infection   Intractable vomiting with nausea, unspecified vomiting type     Discharge Instructions     Alternate Tylenol 1000 mg with Ibuprofen 800 mg  every 8 hours as needed for body aches.  Make sure you drink plenty of sugar free all sports or gatoraid( clear or yellow)   If your Covid test ends up positive you may take the following supplements to help your immune system be stronger to fight this viral infection Take Quarcetin 500 mg three times a day x 7 days with Zinc 50 mg ones a day x 7 days. The quarcetin is an antiviral and anti-inflammatory supplement which helps open the zinc channels in the cell to absorb Zinc. Zinc helps decrease the virus load in your body. Take Melatonin 6-10 mg at bed time which also helps support your immune system.  Also make sure to take Vit D 5,000 IU per day with a fatty meal and Vit C 1000 mg a day until you are completely better. Stay on Vitamin D 2,000  and C the rest of the season.  Don't lay around, keep active and walk as much as you are able to to prevent worsening of your symptoms.  Follow up with your family Dr next week.  If you get short of breath and you are able to check  your oxygen with a pulse oxygen meter, if it gets to 92% or less, you need to go to the hospital to be admitted. If you dont have one, come back here and we will assess you.      ED Prescriptions    Medication Sig Dispense Auth. Provider   ondansetron (ZOFRAN ODT) 8 MG disintegrating tablet Take 1 tablet (8 mg total) by mouth every 8 (eight) hours as needed for nausea or vomiting. 20 tablet Rodriguez-Southworth, Nettie Elm, PA-C     PDMP not reviewed this encounter.   Garey Ham, PA-C 08/03/20 1729

## 2020-08-03 NOTE — Discharge Instructions (Addendum)
Alternate Tylenol 1000 mg with Ibuprofen 800 mg  every 8 hours as needed for body aches.  Make sure you drink plenty of sugar free all sports or gatoraid( clear or yellow)   If your Covid test ends up positive you may take the following supplements to help your immune system be stronger to fight this viral infection Take Quarcetin 500 mg three times a day x 7 days with Zinc 50 mg ones a day x 7 days. The quarcetin is an antiviral and anti-inflammatory supplement which helps open the zinc channels in the cell to absorb Zinc. Zinc helps decrease the virus load in your body. Take Melatonin 6-10 mg at bed time which also helps support your immune system.  Also make sure to take Vit D 5,000 IU per day with a fatty meal and Vit C 1000 mg a day until you are completely better. Stay on Vitamin D 2,000  and C the rest of the season.  Don't lay around, keep active and walk as much as you are able to to prevent worsening of your symptoms.  Follow up with your family Dr next week.  If you get short of breath and you are able to check  your oxygen with a pulse oxygen meter, if it gets to 92% or less, you need to go to the hospital to be admitted. If you dont have one, come back here and we will assess you.

## 2020-08-04 ENCOUNTER — Inpatient Hospital Stay: Payer: Medicare HMO | Attending: Hematology and Oncology

## 2020-08-04 DIAGNOSIS — J069 Acute upper respiratory infection, unspecified: Secondary | ICD-10-CM | POA: Diagnosis not present

## 2020-08-04 DIAGNOSIS — I509 Heart failure, unspecified: Secondary | ICD-10-CM | POA: Diagnosis not present

## 2020-08-04 LAB — SARS CORONAVIRUS 2 (TAT 6-24 HRS): SARS Coronavirus 2: NEGATIVE

## 2020-08-05 ENCOUNTER — Inpatient Hospital Stay: Payer: Medicare HMO

## 2020-08-06 ENCOUNTER — Telehealth: Payer: Self-pay | Admitting: *Deleted

## 2020-08-06 DIAGNOSIS — I509 Heart failure, unspecified: Secondary | ICD-10-CM | POA: Diagnosis not present

## 2020-08-06 NOTE — Telephone Encounter (Signed)
Can you set one aside for her to pick up next week. Tell her to let us know if sample helps her breathing

## 2020-08-06 NOTE — Telephone Encounter (Signed)
PA form received from [facility] that insurance will not covere the spiriva respimat.  Pt must try and fail incruse and atrovent HFA.   Please advise. Thanks    CMM KEY:  [id number]

## 2020-08-06 NOTE — Telephone Encounter (Signed)
Do we have sample of Spiriva respimat 1.25 in [place]?

## 2020-08-06 NOTE — Telephone Encounter (Signed)
We do have a few yes

## 2020-08-07 DIAGNOSIS — I509 Heart failure, unspecified: Secondary | ICD-10-CM | POA: Diagnosis not present

## 2020-08-09 DIAGNOSIS — I509 Heart failure, unspecified: Secondary | ICD-10-CM | POA: Diagnosis not present

## 2020-08-10 ENCOUNTER — Other Ambulatory Visit: Payer: Self-pay

## 2020-08-10 MED ORDER — KETOCONAZOLE 2 % EX CREA: 1.0000 "application " | TOPICAL_CREAM | Freq: Every day | CUTANEOUS | 0 refills | Status: AC

## 2020-08-10 MED ORDER — PIMECROLIMUS 1 % EX CREA: TOPICAL_CREAM | Freq: Two times a day (BID) | CUTANEOUS | 0 refills | Status: DC

## 2020-08-10 MED ORDER — KETOCONAZOLE 2 % EX SHAM: 1.0000 "application " | MEDICATED_SHAMPOO | Freq: Once | CUTANEOUS | 0 refills | Status: AC

## 2020-08-10 NOTE — Progress Notes (Signed)
RX RFs

## 2020-08-10 NOTE — Telephone Encounter (Signed)
PA for the Spiriva has been approved by her insurance from 12/29/2019-11/26/2020.

## 2020-08-11 ENCOUNTER — Other Ambulatory Visit: Payer: Self-pay | Admitting: Primary Care

## 2020-08-11 DIAGNOSIS — I509 Heart failure, unspecified: Secondary | ICD-10-CM | POA: Diagnosis not present

## 2020-08-13 DIAGNOSIS — I509 Heart failure, unspecified: Secondary | ICD-10-CM | POA: Diagnosis not present

## 2020-08-13 NOTE — Progress Notes (Signed)
Agree with the details of the visit as noted by Elizabeth Walsh, NP.  C. Laura Tonye Tancredi, MD [facility] 

## 2020-08-14 DIAGNOSIS — I509 Heart failure, unspecified: Secondary | ICD-10-CM | POA: Diagnosis not present

## 2020-08-16 DIAGNOSIS — Z20822 Contact with and (suspected) exposure to covid-19: Secondary | ICD-10-CM | POA: Diagnosis not present

## 2020-08-16 DIAGNOSIS — J4540 Moderate persistent asthma, uncomplicated: Secondary | ICD-10-CM | POA: Diagnosis not present

## 2020-08-16 DIAGNOSIS — J0100 Acute maxillary sinusitis, unspecified: Secondary | ICD-10-CM | POA: Diagnosis not present

## 2020-08-16 DIAGNOSIS — K047 Periapical abscess without sinus: Secondary | ICD-10-CM | POA: Diagnosis not present

## 2020-08-18 DIAGNOSIS — I509 Heart failure, unspecified: Secondary | ICD-10-CM | POA: Diagnosis not present

## 2020-08-18 DIAGNOSIS — Z20822 Contact with and (suspected) exposure to covid-19: Secondary | ICD-10-CM | POA: Diagnosis not present

## 2020-08-25 DIAGNOSIS — M5416 Radiculopathy, lumbar region: Secondary | ICD-10-CM | POA: Diagnosis not present

## 2020-08-25 DIAGNOSIS — M5126 Other intervertebral disc displacement, lumbar region: Secondary | ICD-10-CM | POA: Diagnosis not present

## 2020-08-25 DIAGNOSIS — G8929 Other chronic pain: Secondary | ICD-10-CM | POA: Diagnosis not present

## 2020-08-25 DIAGNOSIS — M4716 Other spondylosis with myelopathy, lumbar region: Secondary | ICD-10-CM | POA: Diagnosis not present

## 2020-08-31 ENCOUNTER — Other Ambulatory Visit: Payer: Self-pay | Admitting: Primary Care

## 2020-08-31 ENCOUNTER — Inpatient Hospital Stay: Payer: Medicare HMO | Attending: Hematology and Oncology

## 2020-08-31 ENCOUNTER — Other Ambulatory Visit: Payer: Self-pay

## 2020-08-31 DIAGNOSIS — E538 Deficiency of other specified B group vitamins: Secondary | ICD-10-CM | POA: Insufficient documentation

## 2020-08-31 DIAGNOSIS — D500 Iron deficiency anemia secondary to blood loss (chronic): Secondary | ICD-10-CM | POA: Insufficient documentation

## 2020-08-31 LAB — CBC WITH DIFFERENTIAL/PLATELET
Abs Immature Granulocytes: 0.01 10*3/uL (ref 0.00–0.07)
Basophils Absolute: 0 10*3/uL (ref 0.0–0.1)
Basophils Relative: 1 %
Eosinophils Absolute: 0.1 10*3/uL (ref 0.0–0.5)
Eosinophils Relative: 1 %
HCT: 40.5 % (ref 36.0–46.0)
Hemoglobin: 13.2 g/dL (ref 12.0–15.0)
Immature Granulocytes: 0 %
Lymphocytes Relative: 44 %
Lymphs Abs: 2.3 10*3/uL (ref 0.7–4.0)
MCH: 26.8 pg (ref 26.0–34.0)
MCHC: 32.6 g/dL (ref 30.0–36.0)
MCV: 82.3 fL (ref 80.0–100.0)
Monocytes Absolute: 0.5 10*3/uL (ref 0.1–1.0)
Monocytes Relative: 9 %
Neutro Abs: 2.4 10*3/uL (ref 1.7–7.7)
Neutrophils Relative %: 45 %
Platelets: 235 10*3/uL (ref 150–400)
RBC: 4.92 MIL/uL (ref 3.87–5.11)
RDW: 14.1 % (ref 11.5–15.5)
WBC: 5.2 10*3/uL (ref 4.0–10.5)
nRBC: 0 % (ref 0.0–0.2)

## 2020-08-31 LAB — FERRITIN: Ferritin: 50 ng/mL (ref 11–307)

## 2020-08-31 LAB — IRON AND TIBC
Iron: 105 ug/dL (ref 28–170)
Saturation Ratios: 27 % (ref 10.4–31.8)
TIBC: 391 ug/dL (ref 250–450)
UIBC: 286 ug/dL

## 2020-08-31 LAB — VITAMIN B12: Vitamin B-12: 398 pg/mL (ref 180–914)

## 2020-08-31 NOTE — Progress Notes (Signed)
[facility]  [address], [address] [place], [place] [address] Phone: [phone]  Fax: [phone]   Telemedicine Office Visit:  09/01/2020  Referring physician: Emogene Morgan, MD  I connected with Andrea Miller on 09/01/2020 at 2:46 PM by videoconferencing and verified that I was speaking with the correct person using 2 identifiers.  The patient was at home.  I discussed the limitations, risk, security and privacy concerns of performing an evaluation and management service by videoconferencing and the availability of in person appointments.  I also discussed with the patient that there may be a patient responsible charge related to this service.  The patient expressed understanding and agreed to proceed.   Chief Complaint: Andrea Miller is a 34 y.o. female with pulmonary embolism, iron deficiency anemia secondary to menorrhagia,andB12 deficiency who is seen for 3 month assessment.   HPI: The patient was last seen in the hematology clinic on 06/09/2020 via telemedicine by Consuello Masse, NP. At that time, she complained of shortness of breath, lightheadedness and dizziness, and chest pressure intermittently over the past few weeks. She denied pica. She received a Vitamin B12 injection.  Labs on 06/10/2020 revealed a hematocrit of 39.5, hemoglobin 12.7, platelets 263,000, WBC 5,300. Potassium was 3.4. Ferritin was 29 with an iron saturation of 16% and a TIBC of 367. Folate was 8.9.  She saw Ames Dura, NP on 07/16/2020 via telemedicine. She reported increased shortness of breath and wheezing. She was using Symbicort two puffs BID and Singulair. She was using her albuterol nebulizer prn 2-3 x daily. She was given a Spiriva trial.  The patient was seen in the ER on 08/03/2020 for vomiting, body aches, lower leg tightness/swelling, and shortness of breath. COVID-19 test was negative. CXR showed mild cardiomegaly and low lung volumes. There was no active  cardiopulmonary disease. She received Zofran and Toradol.  During the interim, she has been "ok".  She does not feel well today. She reports shortness of breath, tingling hands, dull neck pain, mild chest pain, and dizziness. She states that she feels "stuck."  She went to bed early yesterday because she was "shaking" and felt like she was going to have a seizure. She has not missed any doses of her seizure medication. Her inhalers are not helping her shortness of breath. She denies fevers, numbness, weakness, balance or coordination problems. She is still on her blood thinner. She does not think she has COVID-19.  I recommended that the patient be seen today by Urgent Care or the ER. She does not want to go to the hospital and is going to try to "sleep it off" instead.   Past Medical History:  Diagnosis Date  . Anxiety   . Asthma   . Bipolar disorder (HCC)   . Chest pain    and angina  . CHF (congestive heart failure) (HCC)   . Depression   . Fibromyalgia   . Hypertension   . 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 Obesity   . Pre-diabetes   . Pulmonary embolus (HCC)   . Seizures (HCC)    secondary to anxiety  last seizure 01/25/18  . SVT (supraventricular tachycardia) (HCC)    with hx of syncope; managed by [facility]    Past Surgical History:  Procedure Laterality Date  . CHOLECYSTECTOMY N/A 02/22/2018   Procedure: LAPAROSCOPIC CHOLECYSTECTOMY;  Surgeon: Ancil Linsey, MD;  Location: [facility] ORS;  Service: General;  Laterality: N/A;  .  TONSILLECTOMY AND ADENOIDECTOMY N/A 09/05/2016   Procedure: TONSILLECTOMY AND ADENOIDECTOMY;  Surgeon: Linus Salmons, MD;  Location: [facility] ORS;  Service: ENT;  Laterality: N/A;    Family History  Problem Relation Age of Onset  . Diabetes Mother   . Hypertension Mother   . Asthma Mother   . Gallstones Mother   . Diabetes Father   . Hypertension Father   . Gallstones Father   . Bipolar disorder Sister   .  COPD Brother   . Schizophrenia Brother   . COPD Brother   . Hypertension Brother     Social History:  reports that she has never smoked. She has never used smokeless tobacco. She reports that she does not drink alcohol and does not use drugs.  No tobacco, or illegal subatance use. Prior to her back issues, patient was employed full time at [employer]. The patient is alone today.  Participants in the patient's visit and their role in the encounter included the patient, and Bronwen Betters, CMA, today.  The intake visit was provided by Bronwen Betters, CMA.  Allergies:  Allergies  Allergen Reactions  . Cortizone-10 [Hydrocortisone] Shortness Of Breath and Swelling  . Garlic Anaphylaxis  . Prednisone Swelling    Tongue swelling  . Contrast Media [Iodinated Diagnostic Agents]     Pt reports that "every time" she receives contrast she "can't breathe" for a short time. At time of arrival to room after CT scan, she is breathing normally and VS WNL.  Pt states that she thought not breathing after contrast for a little while was normal. When asked if allergic or has any reaction to IV contrast patient stated no. LP   . Corticosteroids Palpitations    Current Medications: Current Outpatient Medications  Medication Sig Dispense Refill  . albuterol (PROVENTIL) (2.5 MG/3ML) 0.083% nebulizer solution Take 3 mLs (2.5 mg total) by nebulization every 4 (four) hours as needed for wheezing or shortness of breath. 360 mL 0  . budesonide-formoterol (SYMBICORT) 160-4.5 MCG/ACT inhaler Inhale 2 puffs into the lungs 2 (two) times daily. 10.2 g 3  . buprenorphine (SUBUTEX) 2 MG SUBL SL tablet SMARTSIG:0.5 Tablet(s) Sublingual Twice Daily PRN    . busPIRone (BUSPAR) 10 MG tablet Take 10 mg by mouth 2 (two) times daily.     . Calcium-Magnesium 500-250 MG TABS Take 1 tablet by mouth 2 (two) times daily.     . clonazePAM (KLONOPIN) 1 MG tablet Take 1 tablet by mouth 2 (two) times a day.    . cyanocobalamin  (,VITAMIN B-12,) 1000 MCG/ML injection Inject 1,000 mcg into the muscle every 30 (thirty) days.    Marland Kitchen Dexlansoprazole (DEXILANT) 30 MG capsule Take 30 mg by mouth daily.    Marland Kitchen diltiazem (CARDIZEM CD) 360 MG 24 hr capsule Take 360 mg by mouth every morning.    . escitalopram (LEXAPRO) 20 MG tablet Take 20 mg by mouth daily.    . folic acid (FOLVITE) 1 MG tablet Take 1 mg by mouth daily.   10  . furosemide (LASIX) 20 MG tablet Take 40 mg by mouth 2 (two) times daily.     Marland Kitchen gabapentin (NEURONTIN) 400 MG capsule Take 400 mg by mouth 3 (three) times daily.    . hydroxypropyl methylcellulose / hypromellose (ISOPTO TEARS / GONIOVISC) 2.5 % ophthalmic solution Place 1 drop into both eyes 3 (three) times daily as needed for dry eyes.    . hydrOXYzine (ATARAX/VISTARIL) 10 MG tablet Take 10 mg by mouth 2 (two) times  daily.    . Iron-Vitamin C 65-125 MG TABS Take 1 tablet by mouth 2 (two) times daily. 60 tablet 1  . isosorbide mononitrate (IMDUR) 30 MG 24 hr tablet Take 30 mg by mouth daily.    Marland Kitchen lamoTRIgine (LAMICTAL) 200 MG tablet Take 1 tablet by mouth 2 (two) times a day.    . linaclotide (LINZESS) 290 MCG CAPS capsule Take 290 mcg by mouth daily before breakfast.    . montelukast (SINGULAIR) 10 MG tablet Take 1 tablet (10 mg total) by mouth at bedtime. 90 tablet 1  . pimecrolimus (ELIDEL) 1 % cream Apply topically 2 (two) times daily. To areas on face, arms and abdomen 30 g 0  . Potassium Chloride ER 20 MEQ TBCR Take 20 mEq by mouth 2 (two) times daily.     Marland Kitchen PROAIR RESPICLICK 108 (90 Base) MCG/ACT AEPB INHALE 2 PUFFS INTO THE LUNGS EVERY 6 HOURS AS NEEDED. 1 each 1  . propranolol (INDERAL) 40 MG tablet Take 2 tablets (80 mg total) by mouth 3 (three) times daily. 90 tablet 0  . risperiDONE (RISPERDAL) 3 MG tablet Take 3 mg by mouth at bedtime.     . rivaroxaban (XARELTO) 20 MG TABS tablet Take 1 tablet (20 mg total) by mouth every morning. 30 tablet 2  . rosuvastatin (CRESTOR) 20 MG tablet Take 20 mg by  mouth daily.    Marland Kitchen SPIRIVA RESPIMAT 1.25 MCG/ACT AERS INHALE 2 PUFFS INTO THE LUNGS DAILY. 4 g 5  . Topiramate ER (TROKENDI XR) 200 MG CP24 Take 1 capsule by mouth daily.     . vitamin C (VITAMIN C) 250 MG tablet Take 1 tablet (250 mg total) by mouth 2 (two) times daily. 60 tablet 0  . Adapalene 0.3 % gel Apply 1 application topically at bedtime.  (Patient not taking: Reported on 09/01/2020)    . diclofenac Sodium (VOLTAREN) 1 % GEL Apply 2 g topically 4 (four) times daily. (Patient not taking: Reported on 09/01/2020) 100 g 0  . ELIDEL 1 % cream Apply to aa's eczema QD-BID PRN (Patient not taking: Reported on 09/01/2020)    . fluticasone (FLONASE) 50 MCG/ACT nasal spray Place 2 sprays into both nostrils daily as needed for allergies. (Patient not taking: Reported on 09/01/2020) 16 g 1  . ketoconazole (NIZORAL) 2 % cream Apply 1 application topically daily. (Patient not taking: Reported on 09/01/2020) 30 g 0  . ketoconazole (NIZORAL) 2 % shampoo Apply 1 application topically every 30 (thirty) days.  (Patient not taking: Reported on 09/01/2020)  1  . lactulose (CHRONULAC) 10 GM/15ML solution Take 30 g by mouth daily as needed for mild constipation.  (Patient not taking: Reported on 09/01/2020)  5  . ondansetron (ZOFRAN ODT) 8 MG disintegrating tablet Take 1 tablet (8 mg total) by mouth every 8 (eight) hours as needed for nausea or vomiting. (Patient not taking: Reported on 09/01/2020) 20 tablet 0   No current facility-administered medications for this visit.    Review of Systems  Constitutional: Positive for malaise/fatigue. Negative for chills, diaphoresis, fever and weight loss.       Feels "not well" today.  HENT: Negative for congestion, ear discharge, ear pain, hearing loss, nosebleeds, sinus pain, sore throat and tinnitus.   Eyes: Negative for blurred vision.  Respiratory: Positive for shortness of breath. Negative for cough, hemoptysis and sputum production.   Cardiovascular: Positive for chest  pain (mild). Negative for palpitations and leg swelling.  Gastrointestinal: Negative for abdominal pain, blood in  stool, constipation, diarrhea, heartburn, melena, nausea and vomiting.  Genitourinary: Negative for dysuria, frequency, hematuria and urgency.  Musculoskeletal: Positive for neck pain (dull). Negative for back pain, joint pain and myalgias.  Skin: Negative for itching and rash.  Neurological: Positive for dizziness and tingling (hands). Negative for sensory change, weakness and headaches.  Endo/Heme/Allergies: Does not bruise/bleed easily.  Psychiatric/Behavioral: Negative for depression and memory loss. The patient is not nervous/anxious and does not have insomnia.   All other systems reviewed and are negative.  Performance status (ECOG): 1  Vital Signs: There were no vitals taken for this visit.  Physical Exam Vitals and nursing note reviewed.  Constitutional:      General: She is not in acute distress.    Appearance: She is not diaphoretic.  Eyes:     General: No scleral icterus.    Conjunctiva/sclera: Conjunctivae normal.     Comments: Brown eyes.  Neurological:     Mental Status: She is alert.  Psychiatric:        Thought Content: Thought content normal.        Judgment: Judgment normal.    Appointment on 08/31/2020  Component Date Value Ref Range Status  . WBC 08/31/2020 5.2  4.0 - 10.5 K/uL Final  . RBC 08/31/2020 4.92  3.87 - 5.11 MIL/uL Final  . Hemoglobin 08/31/2020 13.2  12.0 - 15.0 g/dL Final  . HCT 16/08/9603 40.5  36 - 46 % Final  . MCV 08/31/2020 82.3  80.0 - 100.0 fL Final  . MCH 08/31/2020 26.8  26.0 - 34.0 pg Final  . MCHC 08/31/2020 32.6  30.0 - 36.0 g/dL Final  . RDW 54/07/8118 14.1  11.5 - 15.5 % Final  . Platelets 08/31/2020 235  150 - 400 K/uL Final  . nRBC 08/31/2020 0.0  0.0 - 0.2 % Final  . Neutrophils Relative % 08/31/2020 45  % Final  . Neutro Abs 08/31/2020 2.4  1.7 - 7.7 K/uL Final  . Lymphocytes Relative 08/31/2020 44  % Final   . Lymphs Abs 08/31/2020 2.3  0.7 - 4.0 K/uL Final  . Monocytes Relative 08/31/2020 9  % Final  . Monocytes Absolute 08/31/2020 0.5  0 - 1 K/uL Final  . Eosinophils Relative 08/31/2020 1  % Final  . Eosinophils Absolute 08/31/2020 0.1  0 - 0 K/uL Final  . Basophils Relative 08/31/2020 1  % Final  . Basophils Absolute 08/31/2020 0.0  0 - 0 K/uL Final  . Immature Granulocytes 08/31/2020 0  % Final  . Abs Immature Granulocytes 08/31/2020 0.01  0.00 - 0.07 K/uL Final   Performed at [facility], [address], [place], [place] [address]  . Iron 08/31/2020 105  28 - 170 ug/dL Final  . TIBC 95/62/1308 391  250 - 450 ug/dL Final  . Saturation Ratios 08/31/2020 27  10.4 - 31.8 % Final  . UIBC 08/31/2020 286  ug/dL Final   Performed at [facility], [address], [place], [place] [address]  . Ferritin 08/31/2020 50  11 - 307 ng/mL Final   Performed at [facility], [address] [place] [place], [place] [address]  . Vitamin B-12 08/31/2020 398  180 - 914 pg/mL Final   Comment: (NOTE) This assay is not validated for testing neonatal or myeloproliferative syndrome specimens for Vitamin B12 levels. Performed at [facility], [address], [place], [place] [address]     Assessment:  Andrea Miller is a 34 y.o. female with small bilateral  pulmonary emboli. Risk factors for thrombosis include obesity and birth control pills.  Chest CT angiogramon 09/15/2018 revealed small bilateral pulmonary emboli and mild cardiomegaly. Bilateral lower extremity duplexon 09/16/2018 revealed no evidence of lower extremity DVT.   Hypercoagulable work-upon 10/11/2018 revealed the following negative studies: factor V Leiden, prothrombin gene mutation, anti-cardiolipin antibodies, and beta-2 glycoprotein antibodies. Lupus anticoagulant testing was positive (on Xarelto).Protein C, protein S, and ATIII were normal on 01/15/2019. Lupus anticoagulant testing was negative on  04/24/2019.  She has iron deficiency anemia.She notes a recent history of extremely heavy mensesx 3 weeks (none in past 2 weeks). Hemoglobin was 10.5 on 10/29/2019and 6.5 on 10/10/2018. Work-up on 10/10/2018 revealed a ferritinof 4, iron saturation 3%. She hasice pica.She received 3 units of PRBCs. She is on oral iron.  She received Venofer on 10/07/2019, 10/20/2019, and 03/19/2020.  Ferritinhas been followed: 4 on 10/11/2018,7 on 01/15/2019, 11 on 04/24/2019, 10 on 09/18/2019, 31 on 11/26/2019, 23 on 03/17/2020, and 50 on 08/31/2020.  She has B12 deficiency. B12 was 242 on 10/10/2018. She began B12 injectionson 10/13/2018 (last07/14/2021). Folate was 20.1 on 11/26/2019.  Diet has been protein shakes and Weight Watcher's.  She had her COVID-19 vaccine in 02/2020. She only received one dose due to the side effects.  Symptomatically, she does not feel well today. She reports shortness of breath, tingling hands, dull neck pain, mild chest pain, and dizziness. She denies any seizure activity.  She denies fevers, numbness, weakness, balance or coordination problems. She remains on Xarelto.  Plan: 1.   Review labs from 08/31/2020. 2.   Iron deficiency anemia Hematocrit 40.5.  Hemoglobin 13.2. MCV 82.3.  Ferritin 50 with an iron saturation of 27% and a TIBC 391. Ferritin goal is 100.    Patient to receive Venofer if ferritin < 30. No Venofer needed. 3. B12 deficiency Continue monthly B12 (last 06/09/2020).  Patient late for injection.  RTC for B12 (next available) and monthly x 6.  Check folate annually. 4. Menorrhagia Patient has heavy menses. Suggest follow-up with [facility] [phone]). 5. Pulmonary embolism Patienton Xarelto. Patient denies any bleeding. Etiology of thrombosis feltsecondary to weight and birth control pills Repeat lupus anticoagulant testing was negative. 6.   Patient told to go to ER for evaluation. 7.   RTC  in 3 months for MD assessment, labs (CBC with diff, CMP, ferritin-day before) and +/- Venofer.  I discussed the assessment and treatment plan with the patient.  The patient was provided an opportunity to ask questions and all were answered.  The patient agreed with the plan and demonstrated an understanding of the instructions.  The patient was advised to call back if the symptoms worsen or if the condition fails to improve as anticipated.  I provided 8 minutes (2:45 PM - 2:53 PM) of face-to-face video visit time during this this encounter and > 50% was spent counseling as documented under my assessment and plan. An additional 7 minutes were spent reviewing her chart (Epic and Care Everywhere) including notes, labs, and imaging studies.  I provided these services from the [facility] office.   Rosey Bath, MD, PhD    09/01/2020, 2:44 PM  I, Danella Penton Tufford, am acting as Neurosurgeon for [employer]. Merlene Pulling, MD, PhD.  I, Khailee Mick C. Merlene Pulling, MD, have reviewed the above documentation for accuracy and completeness, and I agree with the above.

## 2020-09-01 ENCOUNTER — Inpatient Hospital Stay (HOSPITAL_BASED_OUTPATIENT_CLINIC_OR_DEPARTMENT_OTHER): Payer: Medicare HMO | Admitting: Hematology and Oncology

## 2020-09-01 ENCOUNTER — Other Ambulatory Visit: Payer: Self-pay

## 2020-09-01 ENCOUNTER — Inpatient Hospital Stay: Payer: Medicare HMO

## 2020-09-01 ENCOUNTER — Encounter: Payer: Self-pay | Admitting: Hematology and Oncology

## 2020-09-01 ENCOUNTER — Telehealth: Payer: Self-pay

## 2020-09-01 DIAGNOSIS — I2694 Multiple subsegmental pulmonary emboli without acute cor pulmonale: Secondary | ICD-10-CM | POA: Diagnosis not present

## 2020-09-01 DIAGNOSIS — Z7901 Long term (current) use of anticoagulants: Secondary | ICD-10-CM | POA: Diagnosis not present

## 2020-09-01 DIAGNOSIS — D500 Iron deficiency anemia secondary to blood loss (chronic): Secondary | ICD-10-CM

## 2020-09-01 DIAGNOSIS — G8929 Other chronic pain: Secondary | ICD-10-CM | POA: Diagnosis not present

## 2020-09-01 DIAGNOSIS — Z6841 Body Mass Index (BMI) 40.0 and over, adult: Secondary | ICD-10-CM | POA: Diagnosis not present

## 2020-09-01 DIAGNOSIS — J45909 Unspecified asthma, uncomplicated: Secondary | ICD-10-CM | POA: Diagnosis not present

## 2020-09-01 DIAGNOSIS — I10 Essential (primary) hypertension: Secondary | ICD-10-CM | POA: Diagnosis not present

## 2020-09-01 DIAGNOSIS — I471 Supraventricular tachycardia: Secondary | ICD-10-CM | POA: Diagnosis not present

## 2020-09-01 DIAGNOSIS — R69 Illness, unspecified: Secondary | ICD-10-CM | POA: Diagnosis not present

## 2020-09-01 DIAGNOSIS — G40909 Epilepsy, unspecified, not intractable, without status epilepticus: Secondary | ICD-10-CM | POA: Diagnosis not present

## 2020-09-01 DIAGNOSIS — E538 Deficiency of other specified B group vitamins: Secondary | ICD-10-CM | POA: Diagnosis not present

## 2020-09-01 DIAGNOSIS — G4730 Sleep apnea, unspecified: Secondary | ICD-10-CM | POA: Diagnosis not present

## 2020-09-01 NOTE — Patient Instructions (Signed)
-   Please go to the ER for evaluation

## 2020-09-01 NOTE — Progress Notes (Signed)
The patient c/o increase SOB( she is unaware when this started she states she has not been paying attention). The patient Name and DOB has been verified by phone today.

## 2020-09-01 NOTE — Telephone Encounter (Signed)
Called patients mother. Mother states that they do not have a car to get to the ED or urgent care. I asked her if there was anyone they could call to take patient and she states that she thought the patient was going to call 911. I advised her to do so because Dr Merlene Pulling was worried about the patient and reported SOB. Mother states she will "keep an eye on her"

## 2020-09-02 NOTE — Telephone Encounter (Signed)
  Please call patient today and see how she is doing.  M

## 2020-09-06 ENCOUNTER — Ambulatory Visit: Payer: Medicare HMO | Admitting: Cardiology

## 2020-09-07 ENCOUNTER — Encounter: Payer: Self-pay | Admitting: Cardiology

## 2020-09-26 ENCOUNTER — Emergency Department
Admission: EM | Admit: 2020-09-26 | Discharge: 2020-09-26 | Disposition: A | Discharge status: Home / Self Care | Payer: Medicare HMO | Attending: Emergency Medicine | Admitting: Emergency Medicine

## 2020-09-26 ENCOUNTER — Other Ambulatory Visit: Payer: Self-pay

## 2020-09-26 DIAGNOSIS — M79602 Pain in left arm: Secondary | ICD-10-CM | POA: Diagnosis not present

## 2020-09-26 DIAGNOSIS — Z79899 Other long term (current) drug therapy: Secondary | ICD-10-CM | POA: Insufficient documentation

## 2020-09-26 DIAGNOSIS — L731 Pseudofolliculitis barbae: Secondary | ICD-10-CM | POA: Diagnosis not present

## 2020-09-26 DIAGNOSIS — E119 Type 2 diabetes mellitus without complications: Secondary | ICD-10-CM | POA: Diagnosis not present

## 2020-09-26 DIAGNOSIS — J45909 Unspecified asthma, uncomplicated: Secondary | ICD-10-CM | POA: Diagnosis not present

## 2020-09-26 DIAGNOSIS — Z7901 Long term (current) use of anticoagulants: Secondary | ICD-10-CM | POA: Diagnosis not present

## 2020-09-26 DIAGNOSIS — Z86711 Personal history of pulmonary embolism: Secondary | ICD-10-CM | POA: Diagnosis not present

## 2020-09-26 DIAGNOSIS — I2510 Atherosclerotic heart disease of native coronary artery without angina pectoris: Secondary | ICD-10-CM | POA: Diagnosis not present

## 2020-09-26 DIAGNOSIS — Z794 Long term (current) use of insulin: Secondary | ICD-10-CM | POA: Diagnosis not present

## 2020-09-26 DIAGNOSIS — I110 Hypertensive heart disease with heart failure: Secondary | ICD-10-CM | POA: Diagnosis not present

## 2020-09-26 DIAGNOSIS — I5032 Chronic diastolic (congestive) heart failure: Secondary | ICD-10-CM | POA: Insufficient documentation

## 2020-09-26 NOTE — ED Triage Notes (Signed)
Pt states knot under her left arm causing left arm pain. Thinks lymph node swelling.

## 2020-09-26 NOTE — ED Provider Notes (Signed)
[facility] Emergency Department Provider Note  ____________________________________________  Time seen: Approximately 10:15 AM  I have reviewed the triage vital signs and the nursing notes.   HISTORY  Chief Complaint knot under arm    HPI Andrea Miller is a 34 y.o. female that presents to the emergency department for evaluation of a "knot" to the left axilla for 1 week.  Area is nontender.  Patient was unsure whether it is a lymph node.  She came to the emergency department today because her brother was also coming to be evaluated.  She also would like to note that she did develop a rash to the same area in her axilla after receiving her Covid vaccine a couple of months ago.  That rash did resolve.   Past Medical History:  Diagnosis Date   Anxiety    Asthma    Bipolar disorder (HCC)    Chest pain    and angina   CHF (congestive heart failure) (HCC)    Depression    Fibromyalgia    Hypertension    Left lumbar radiculopathy since 08/2014   secondary to work accident   Obesity    Pre-diabetes    Pulmonary embolus (HCC)    Seizures (HCC)    secondary to anxiety  last seizure 01/25/18   SVT (supraventricular tachycardia) (HCC)    with hx of syncope; managed by [facility]    Patient Active Problem List   Diagnosis Date Noted   Chronic anticoagulation 05/07/2019   Iron deficiency anemia due to chronic blood loss 10/15/2018   B12 deficiency 10/13/2018   Symptomatic anemia 10/11/2018   Multiple subsegmental pulmonary emboli without acute cor pulmonale (HCC) 09/15/2018   Morbid obesity with BMI of 50.0-59.9, adult (HCC) 09/15/2018   Chronic diastolic heart failure (HCC) 07/01/2018   Lymphedema 07/01/2018   Chronic cholecystitis    Edema 02/13/2018   Diabetes mellitus type 2, uncomplicated (HCC) 02/13/2018   Hypertension 11/23/2017   Asthma exacerbation 04/14/2017   Asthma without status asthmaticus 01/16/2017    Bipolar affective (HCC) 01/16/2017   Coronary artery disease 01/16/2017   Syncope 12/12/2016   Fibromyalgia 04/27/2014   OSA (obstructive sleep apnea) 04/27/2014   Seizure-like activity (HCC) 04/27/2014   Migraine without status migrainosus, not intractable 04/27/2014   Obesity 04/15/2014   Sleep disorder 04/15/2014   Neck pain 04/15/2014   Headache 04/15/2014   Restless legs syndrome 04/15/2014   Supraventricular tachycardia (HCC) 07/13/2009    Past Surgical History:  Procedure Laterality Date   CHOLECYSTECTOMY N/A 02/22/2018   Procedure: LAPAROSCOPIC CHOLECYSTECTOMY;  Surgeon: Ancil Linsey, MD;  Location: [facility] ORS;  Service: General;  Laterality: N/A;   TONSILLECTOMY AND ADENOIDECTOMY N/A 09/05/2016   Procedure: TONSILLECTOMY AND ADENOIDECTOMY;  Surgeon: Linus Salmons, MD;  Location: [facility] ORS;  Service: ENT;  Laterality: N/A;    Prior to Admission medications   Medication Sig Start Date End Date Taking? Authorizing Provider  Adapalene 0.3 % gel Apply 1 application topically at bedtime.  Patient not taking: Reported on 09/01/2020 10/20/19   [provider]  albuterol (PROVENTIL) (2.5 MG/3ML) 0.083% nebulizer solution Take 3 mLs (2.5 mg total) by nebulization every 4 (four) hours as needed for wheezing or shortness of breath. 09/23/19 09/01/20  Salena Saner, MD  budesonide-formoterol [facility]) 160-4.5 MCG/ACT inhaler Inhale 2 puffs into the lungs 2 (two) times daily. 07/16/20 09/01/20  Glenford Bayley, NP  buprenorphine (SUBUTEX) 2 MG SUBL SL tablet SMARTSIG:0.5 Tablet(s) Sublingual Twice Daily PRN  03/12/20   [provider]  busPIRone (BUSPAR) 10 MG tablet Take 10 mg by mouth 2 (two) times daily.  10/22/19   [provider]  Calcium-Magnesium 500-250 MG TABS Take 1 tablet by mouth 2 (two) times daily.     [provider]  clonazePAM (KLONOPIN) 1 MG tablet Take 1 tablet by mouth 2 (two) times a day. 04/02/19   [provider]  cyanocobalamin (,VITAMIN B-12,) 1000 MCG/ML injection Inject 1,000 mcg into the muscle every 30 (thirty) days.    [provider]  Dexlansoprazole (DEXILANT) 30 MG capsule Take 30 mg by mouth daily.    [provider]  diclofenac Sodium (VOLTAREN) 1 % GEL Apply 2 g topically 4 (four) times daily. Patient not taking: Reported on 09/01/2020 03/12/20   Dartha Lodge, PA-C  diltiazem (CARDIZEM CD) 360 MG 24 hr capsule Take 360 mg by mouth every morning. 12/18/18   [provider]  ELIDEL 1 % cream Apply to aa's eczema QD-BID PRN Patient not taking: Reported on 09/01/2020 01/20/20   [provider]  escitalopram (LEXAPRO) 20 MG tablet Take 20 mg by mouth daily.    [provider]  fluticasone (FLONASE) 50 MCG/ACT nasal spray Place 2 sprays into both nostrils daily as needed for allergies. Patient not taking: Reported on 09/01/2020 07/16/20   Glenford Bayley, NP  folic acid (FOLVITE) 1 MG tablet Take 1 mg by mouth daily.  12/15/16   [provider]  furosemide (LASIX) 20 MG tablet Take 40 mg by mouth 2 (two) times daily.     [provider]  gabapentin (NEURONTIN) 400 MG capsule Take 400 mg by mouth 3 (three) times daily.    [provider]  hydroxypropyl methylcellulose / hypromellose (ISOPTO TEARS / GONIOVISC) 2.5 % ophthalmic solution Place 1 drop into both eyes 3 (three) times daily as needed for dry eyes.    [provider]  hydrOXYzine (ATARAX/VISTARIL) 10 MG tablet Take 10 mg by mouth 2 (two) times daily. 11/18/19   [provider]  Iron-Vitamin C 65-125 MG TABS Take 1 tablet by mouth 2 (two) times daily. 09/19/19   Rosey Bath, MD  isosorbide mononitrate (IMDUR) 30 MG 24 hr tablet Take 30 mg by mouth daily. 03/08/20   [provider]  ketoconazole (NIZORAL) 2 % shampoo Apply 1 application topically every 30 (thirty) days.  Patient not taking: Reported on 09/01/2020 10/01/18    [provider]  lactulose (CHRONULAC) 10 GM/15ML solution Take 30 g by mouth daily as needed for mild constipation.  Patient not taking: Reported on 09/01/2020 08/28/18   [provider]  lamoTRIgine (LAMICTAL) 200 MG tablet Take 1 tablet by mouth 2 (two) times a day. 05/20/19   [provider]  linaclotide (LINZESS) 290 MCG CAPS capsule Take 290 mcg by mouth daily before breakfast.    [provider]  montelukast (SINGULAIR) 10 MG tablet Take 1 tablet (10 mg total) by mouth at bedtime. 07/16/20   Glenford Bayley, NP  ondansetron (ZOFRAN ODT) 8 MG disintegrating tablet Take 1 tablet (8 mg total) by mouth every 8 (eight) hours as needed for nausea or vomiting. Patient not taking: Reported on 09/01/2020 08/03/20   Rodriguez-Southworth, Nettie Elm, PA-C  pimecrolimus (ELIDEL) 1 % cream Apply topically 2 (two) times daily. To areas on face, arms and abdomen 08/10/20   Willeen Niece, MD  Potassium Chloride ER 20 MEQ TBCR Take 20 mEq by mouth 2 (two) times daily.  [provider]  PROAIR RESPICLICK 108 [phone] Base) MCG/ACT AEPB INHALE 2 PUFFS INTO THE LUNGS EVERY 6 HOURS AS NEEDED. 07/16/20   Salena Saner, MD  propranolol (INDERAL) 40 MG tablet Take 2 tablets (80 mg total) by mouth 3 (three) times daily. 10/13/18   Salary, Evelena Asa, MD  risperiDONE (RISPERDAL) 3 MG tablet Take 3 mg by mouth at bedtime.     [provider]  rivaroxaban (XARELTO) 20 MG TABS tablet Take 1 tablet (20 mg total) by mouth every morning. 04/29/20   Rosey Bath, MD  rosuvastatin (CRESTOR) 20 MG tablet Take 20 mg by mouth daily.    [provider]  SPIRIVA RESPIMAT 1.25 MCG/ACT AERS INHALE 2 PUFFS INTO THE LUNGS DAILY. 08/31/20   Glenford Bayley, NP  Topiramate ER (TROKENDI XR) 200 MG CP24 Take 1 capsule by mouth daily.     [provider]  vitamin C (VITAMIN C) 250 MG tablet Take 1 tablet (250 mg total) by mouth 2 (two) times daily. 10/13/18   Salary,  Evelena Asa, MD  dicyclomine (BENTYL) 10 MG capsule Take 1 capsule (10 mg total) by mouth every 6 (six) hours as needed for up to 7 days for spasms (abdominal pain). 04/18/20 08/03/20  Sharman Cheek, MD    Allergies Cortizone-10 [hydrocortisone], Garlic, Prednisone, Contrast media [iodinated diagnostic agents], and Corticosteroids  Family History  Problem Relation Age of Onset   Diabetes Mother    Hypertension Mother    Asthma Mother    Gallstones Mother    Diabetes Father    Hypertension Father    Gallstones Father    Bipolar disorder Sister    COPD Brother    Schizophrenia Brother    COPD Brother    Hypertension Brother     Social History Social History   Tobacco Use   Smoking status: Never Smoker   Smokeless tobacco: Never Used  Building services engineer Use: Never used  Substance Use Topics   Alcohol use: No   Drug use: No     Review of Systems  Constitutional: No fever/chills Cardiovascular: No chest pain. Respiratory: No SOB. Gastrointestinal: No abdominal pain.  No nausea, no vomiting.  Musculoskeletal: Negative for musculoskeletal pain. Skin: Negative for abrasions, lacerations, ecchymosis. Positive for rash. Neurological: Negative for headaches   ____________________________________________   PHYSICAL EXAM:  VITAL SIGNS: ED Triage Vitals  Enc Vitals Group     BP 09/26/20 0847 (!) 146/81     Pulse Rate 09/26/20 0846 98     Resp 09/26/20 0846 18     Temp 09/26/20 0847 99.7 F (37.6 C)     Temp Source 09/26/20 0847 Oral     SpO2 09/26/20 0846 100 %     Weight 09/26/20 0846 (!) 330 lb (149.7 kg)     Height 09/26/20 0846 5\' 10"  (1.778 m)     Head Circumference --      Peak Flow --      Pain Score 09/26/20 0846 9     Pain Loc --      Pain Edu? --      Excl. in GC? --      Constitutional: Alert and oriented. Well appearing and in no acute distress. Eyes: Conjunctivae are normal. PERRL. EOMI. Head: Atraumatic. ENT:      Ears:       Nose: No congestion/rhinnorhea.      Mouth/Throat: Mucous membranes are moist.  Neck: No stridor.  Cardiovascular: Normal rate, regular  rhythm.  Good peripheral circulation. Respiratory: Normal respiratory effort without tachypnea or retractions. Lungs CTAB. Good air entry to the bases with no decreased or absent breath sounds. Gastrointestinal: Bowel sounds 4 quadrants. Soft and nontender to palpation. No guarding or rigidity. No palpable masses. No distention.  Musculoskeletal: Full range of motion to all extremities. No gross deformities appreciated. Neurologic:  Normal speech and language. No gross focal neurologic deficits are appreciated.  Skin:  Skin is warm, dry.  1/4 cm x 1/4 cm small area of induration surrounding a hair follicle to the left axilla.  Some minimal overlying skin peeling.  No erythema.  Nontender.  No drainage.  No surrounding rash Psychiatric: Mood and affect are normal. Speech and behavior are normal. Patient exhibits appropriate insight and judgement.   ____________________________________________   LABS (all labs ordered are listed, but only abnormal results are displayed)  Labs Reviewed - No data to display ____________________________________________  EKG   ____________________________________________  RADIOLOGY   No results found.  ____________________________________________    PROCEDURES  Procedure(s) performed:    Procedures    Medications - No data to display   ____________________________________________   INITIAL IMPRESSION / ASSESSMENT AND PLAN / ED COURSE  Pertinent labs & imaging results that were available during my care of the patient were reviewed by me and considered in my medical decision making (see chart for details).  Review of the [place] CSRS was performed in accordance of the [facility] prior to dispensing any controlled drugs.    Patient's diagnosis is consistent with ingrown hair. Vital signs and exam are  reassuring. No indication of bacterial infection. Patient is to follow up with primary care as directed. Patient is given ED precautions to return to the ED for any worsening or new symptoms.  Andrea Miller was evaluated in Emergency Department on 09/26/2020 for the symptoms described in the history of present illness. She was evaluated in the context of the global COVID-19 pandemic, which necessitated consideration that the patient might be at risk for infection with the SARS-CoV-2 virus that causes COVID-19. Institutional protocols and algorithms that pertain to the evaluation of patients at risk for COVID-19 are in a state of rapid change based on information released by regulatory bodies including the CDC and federal and state organizations. These policies and algorithms were followed during the patient's care in the ED.   ____________________________________________  FINAL CLINICAL IMPRESSION(S) / ED DIAGNOSES  Final diagnoses:  Ingrown hair      NEW MEDICATIONS STARTED DURING THIS VISIT:  ED Discharge Orders    None          This chart was dictated using voice recognition software/Dragon. Despite best efforts to proofread, errors can occur which can change the meaning. Any change was purely unintentional.    Enid Derry, PA-C 09/26/20 1533    Sharyn Creamer, MD 09/28/20 [phone]

## 2020-09-27 DIAGNOSIS — I509 Heart failure, unspecified: Secondary | ICD-10-CM | POA: Diagnosis not present

## 2020-09-29 DIAGNOSIS — I509 Heart failure, unspecified: Secondary | ICD-10-CM | POA: Diagnosis not present

## 2020-10-01 DIAGNOSIS — I509 Heart failure, unspecified: Secondary | ICD-10-CM | POA: Diagnosis not present

## 2020-10-02 DIAGNOSIS — I509 Heart failure, unspecified: Secondary | ICD-10-CM | POA: Diagnosis not present

## 2020-10-04 DIAGNOSIS — I509 Heart failure, unspecified: Secondary | ICD-10-CM | POA: Diagnosis not present

## 2020-10-06 DIAGNOSIS — I509 Heart failure, unspecified: Secondary | ICD-10-CM | POA: Diagnosis not present

## 2020-10-08 DIAGNOSIS — F411 Generalized anxiety disorder: Secondary | ICD-10-CM | POA: Diagnosis not present

## 2020-10-08 DIAGNOSIS — F3162 Bipolar disorder, current episode mixed, moderate: Secondary | ICD-10-CM | POA: Diagnosis not present

## 2020-10-08 DIAGNOSIS — F900 Attention-deficit hyperactivity disorder, predominantly inattentive type: Secondary | ICD-10-CM | POA: Diagnosis not present

## 2020-10-08 DIAGNOSIS — R69 Illness, unspecified: Secondary | ICD-10-CM | POA: Diagnosis not present

## 2020-10-08 DIAGNOSIS — F4312 Post-traumatic stress disorder, chronic: Secondary | ICD-10-CM | POA: Diagnosis not present

## 2020-10-08 DIAGNOSIS — F321 Major depressive disorder, single episode, moderate: Secondary | ICD-10-CM | POA: Diagnosis not present

## 2020-10-11 DIAGNOSIS — I509 Heart failure, unspecified: Secondary | ICD-10-CM | POA: Diagnosis not present

## 2020-10-18 DIAGNOSIS — Z6841 Body Mass Index (BMI) 40.0 and over, adult: Secondary | ICD-10-CM | POA: Diagnosis not present

## 2020-10-18 DIAGNOSIS — I5030 Unspecified diastolic (congestive) heart failure: Secondary | ICD-10-CM | POA: Diagnosis not present

## 2020-10-18 DIAGNOSIS — I509 Heart failure, unspecified: Secondary | ICD-10-CM | POA: Diagnosis not present

## 2020-10-18 DIAGNOSIS — I471 Supraventricular tachycardia: Secondary | ICD-10-CM | POA: Diagnosis not present

## 2020-10-20 DIAGNOSIS — I509 Heart failure, unspecified: Secondary | ICD-10-CM | POA: Diagnosis not present

## 2020-10-22 DIAGNOSIS — I509 Heart failure, unspecified: Secondary | ICD-10-CM | POA: Diagnosis not present

## 2020-10-23 DIAGNOSIS — I509 Heart failure, unspecified: Secondary | ICD-10-CM | POA: Diagnosis not present

## 2020-10-26 ENCOUNTER — Other Ambulatory Visit: Payer: Self-pay

## 2020-10-26 ENCOUNTER — Ambulatory Visit
Admission: EM | Admit: 2020-10-26 | Discharge: 2020-10-26 | Disposition: A | Discharge status: Home / Self Care | Payer: Medicare HMO | Attending: Family Medicine | Admitting: Family Medicine

## 2020-10-26 ENCOUNTER — Encounter: Payer: Self-pay | Admitting: Emergency Medicine

## 2020-10-26 DIAGNOSIS — N179 Acute kidney failure, unspecified: Secondary | ICD-10-CM | POA: Diagnosis not present

## 2020-10-26 DIAGNOSIS — Z6841 Body Mass Index (BMI) 40.0 and over, adult: Secondary | ICD-10-CM | POA: Diagnosis not present

## 2020-10-26 DIAGNOSIS — I471 Supraventricular tachycardia: Secondary | ICD-10-CM | POA: Diagnosis not present

## 2020-10-26 DIAGNOSIS — R1031 Right lower quadrant pain: Secondary | ICD-10-CM

## 2020-10-26 DIAGNOSIS — F319 Bipolar disorder, unspecified: Secondary | ICD-10-CM | POA: Diagnosis not present

## 2020-10-26 DIAGNOSIS — I110 Hypertensive heart disease with heart failure: Secondary | ICD-10-CM | POA: Diagnosis not present

## 2020-10-26 DIAGNOSIS — R110 Nausea: Secondary | ICD-10-CM | POA: Diagnosis not present

## 2020-10-26 DIAGNOSIS — J4540 Moderate persistent asthma, uncomplicated: Secondary | ICD-10-CM | POA: Diagnosis not present

## 2020-10-26 DIAGNOSIS — K5732 Diverticulitis of large intestine without perforation or abscess without bleeding: Secondary | ICD-10-CM | POA: Diagnosis not present

## 2020-10-26 DIAGNOSIS — R509 Fever, unspecified: Secondary | ICD-10-CM | POA: Diagnosis not present

## 2020-10-26 DIAGNOSIS — I5032 Chronic diastolic (congestive) heart failure: Secondary | ICD-10-CM | POA: Diagnosis not present

## 2020-10-26 DIAGNOSIS — Z20822 Contact with and (suspected) exposure to covid-19: Secondary | ICD-10-CM | POA: Diagnosis not present

## 2020-10-26 DIAGNOSIS — R69 Illness, unspecified: Secondary | ICD-10-CM | POA: Diagnosis not present

## 2020-10-26 NOTE — ED Triage Notes (Signed)
Patient c/o right side pain that started 3 days ago, decreased appetite.

## 2020-10-26 NOTE — ED Provider Notes (Signed)
[facility]    CSN: [id number] Arrival date & time: 10/26/20  1901      History   Chief Complaint Chief Complaint  Patient presents with  . Flank Pain     HPI   34 year old female presents with the above complaint.  Patient reports that she has had right side pain for the past 3 days.  Associated anorexia.  Subjective fever.  Temperature currently 99.4.  Patient denies any respiratory symptoms.  She states that her pain is worse with activity and movement.  Pain currently 10/10 in severity.  No relieving factors.  No other reported symptoms.  No other complaints.  Past Medical History:  Diagnosis Date  . Anxiety   . Asthma   . Bipolar disorder (HCC)   . Chest pain    and angina  . CHF (congestive heart failure) (HCC)   . Depression   . Fibromyalgia   . Hypertension   . 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 Obesity   . Pre-diabetes   . Pulmonary embolus (HCC)   . Seizures (HCC)    secondary to anxiety  last seizure 01/25/18  . SVT (supraventricular tachycardia) (HCC)    with hx of syncope; managed by [facility]    Patient Active Problem List   Diagnosis Date Noted  . Chronic anticoagulation 05/07/2019  . Iron deficiency anemia due to chronic blood loss 10/15/2018  . B12 deficiency 10/13/2018  . Symptomatic anemia 10/11/2018  . Multiple subsegmental pulmonary emboli without acute cor pulmonale (HCC) 09/15/2018  . Morbid obesity with BMI of 50.0-59.9, adult (HCC) 09/15/2018  . Chronic diastolic heart failure (HCC) 07/01/2018  . Lymphedema 07/01/2018  . Chronic cholecystitis   . Edema 02/13/2018  . Diabetes mellitus type 2, uncomplicated (HCC) 02/13/2018  . Hypertension 11/23/2017  . Asthma exacerbation 04/14/2017  . Asthma without status asthmaticus 01/16/2017  . Bipolar affective (HCC) 01/16/2017  . Coronary artery disease 01/16/2017  . Syncope 12/12/2016  . Fibromyalgia 04/27/2014  . OSA (obstructive sleep  apnea) 04/27/2014  . Seizure-like activity (HCC) 04/27/2014  . Migraine without status migrainosus, not intractable 04/27/2014  . Obesity 04/15/2014  . Sleep disorder 04/15/2014  . Neck pain 04/15/2014  . Headache 04/15/2014  . Restless legs syndrome 04/15/2014  . Supraventricular tachycardia (HCC) 07/13/2009    Past Surgical History:  Procedure Laterality Date  . CHOLECYSTECTOMY N/A 02/22/2018   Procedure: LAPAROSCOPIC CHOLECYSTECTOMY;  Surgeon: Ancil Linsey, MD;  Location: [facility] ORS;  Service: General;  Laterality: N/A;  . TONSILLECTOMY AND ADENOIDECTOMY N/A 09/05/2016   Procedure: TONSILLECTOMY AND ADENOIDECTOMY;  Surgeon: Linus Salmons, MD;  Location: [facility] ORS;  Service: ENT;  Laterality: N/A;    OB History    Gravida  0   Para  0   Term  0   Preterm  0   AB  0   Living  0     SAB  0   TAB  0   Ectopic  0   Multiple  0   Live Births               Home Medications    Prior to Admission medications   Medication Sig Start Date End Date Taking? Authorizing Provider  albuterol (PROVENTIL) (2.5 MG/3ML) 0.083% nebulizer solution Take 3 mLs (2.5 mg total) by nebulization every 4 (four) hours as needed for wheezing or shortness of breath. 09/23/19 10/26/20 Yes Salena Saner, MD  budesonide-formoterol [facility]) 160-4.5 MCG/ACT inhaler  Inhale 2 puffs into the lungs 2 (two) times daily. 07/16/20 10/26/20 Yes Glenford Bayley, NP  buprenorphine (SUBUTEX) 2 MG SUBL SL tablet SMARTSIG:0.5 Tablet(s) Sublingual Twice Daily PRN 03/12/20  Yes [provider]  busPIRone (BUSPAR) 10 MG tablet Take 10 mg by mouth 2 (two) times daily.  10/22/19  Yes [provider]  Calcium-Magnesium 500-250 MG TABS Take 1 tablet by mouth 2 (two) times daily.    Yes [provider]  clonazePAM (KLONOPIN) 1 MG tablet Take 1 tablet by mouth 2 (two) times a day. 04/02/19  Yes [provider]  cyanocobalamin (,VITAMIN B-12,) 1000 MCG/ML injection  Inject 1,000 mcg into the muscle every 30 (thirty) days.   Yes [provider]  Dexlansoprazole (DEXILANT) 30 MG capsule Take 30 mg by mouth daily.   Yes [provider]  diltiazem (CARDIZEM CD) 360 MG 24 hr capsule Take 360 mg by mouth every morning. 12/18/18  Yes [provider]  ELIDEL 1 % cream Apply to aa's eczema QD-BID PRN 01/20/20  Yes [provider]  escitalopram (LEXAPRO) 20 MG tablet Take 20 mg by mouth daily.   Yes [provider]  fluticasone (FLONASE) 50 MCG/ACT nasal spray Place 2 sprays into both nostrils daily as needed for allergies. 07/16/20  Yes Glenford Bayley, NP  folic acid (FOLVITE) 1 MG tablet Take 1 mg by mouth daily.  12/15/16  Yes [provider]  furosemide (LASIX) 20 MG tablet Take 40 mg by mouth 2 (two) times daily.    Yes [provider]  gabapentin (NEURONTIN) 400 MG capsule Take 400 mg by mouth 3 (three) times daily.   Yes [provider]  hydroxypropyl methylcellulose / hypromellose (ISOPTO TEARS / GONIOVISC) 2.5 % ophthalmic solution Place 1 drop into both eyes 3 (three) times daily as needed for dry eyes.   Yes [provider]  hydrOXYzine (ATARAX/VISTARIL) 10 MG tablet Take 10 mg by mouth 2 (two) times daily. 11/18/19  Yes [provider]  Iron-Vitamin C 65-125 MG TABS Take 1 tablet by mouth 2 (two) times daily. 09/19/19  Yes Corcoran, Ferdie Ping, MD  isosorbide mononitrate (IMDUR) 30 MG 24 hr tablet Take 30 mg by mouth daily. 03/08/20  Yes [provider]  lamoTRIgine (LAMICTAL) 200 MG tablet Take 1 tablet by mouth 2 (two) times a day. 05/20/19  Yes [provider]  linaclotide (LINZESS) 290 MCG CAPS capsule Take 290 mcg by mouth daily before breakfast.   Yes [provider]  montelukast (SINGULAIR) 10 MG tablet Take 1 tablet (10 mg total) by mouth at bedtime. 07/16/20  Yes Glenford Bayley, NP  Potassium Chloride ER 20 MEQ TBCR Take 20 mEq by mouth  2 (two) times daily.    Yes [provider]  PROAIR RESPICLICK 108 (90 Base) MCG/ACT AEPB INHALE 2 PUFFS INTO THE LUNGS EVERY 6 HOURS AS NEEDED. 07/16/20  Yes Salena Saner, MD  propranolol (INDERAL) 40 MG tablet Take 2 tablets (80 mg total) by mouth 3 (three) times daily. 10/13/18  Yes Salary, Montell D, MD  risperiDONE (RISPERDAL) 3 MG tablet Take 3 mg by mouth at bedtime.    Yes [provider]  rivaroxaban (XARELTO) 20 MG TABS tablet Take 1 tablet (20 mg total) by mouth every morning. 04/29/20  Yes Corcoran, Ferdie Ping, MD  rosuvastatin (CRESTOR) 20 MG tablet Take 20 mg by mouth daily.   Yes [provider]  SPIRIVA RESPIMAT 1.25 MCG/ACT AERS INHALE 2 PUFFS INTO  THE LUNGS DAILY. 08/31/20  Yes Glenford Bayley, NP  Topiramate ER (TROKENDI XR) 200 MG CP24 Take 1 capsule by mouth daily.    Yes [provider]  vitamin C (VITAMIN C) 250 MG tablet Take 1 tablet (250 mg total) by mouth 2 (two) times daily. 10/13/18  Yes Salary, Montell D, MD  Adapalene 0.3 % gel Apply 1 application topically at bedtime.  Patient not taking: Reported on 09/01/2020 10/20/19   [provider]  diclofenac Sodium (VOLTAREN) 1 % GEL Apply 2 g topically 4 (four) times daily. Patient not taking: Reported on 09/01/2020 03/12/20   Dartha Lodge, PA-C  ketoconazole (NIZORAL) 2 % shampoo Apply 1 application topically every 30 (thirty) days.  Patient not taking: Reported on 09/01/2020 10/01/18   [provider]  lactulose (CHRONULAC) 10 GM/15ML solution Take 30 g by mouth daily as needed for mild constipation.  Patient not taking: Reported on 09/01/2020 08/28/18   [provider]  ondansetron (ZOFRAN ODT) 8 MG disintegrating tablet Take 1 tablet (8 mg total) by mouth every 8 (eight) hours as needed for nausea or vomiting. Patient not taking: Reported on 09/01/2020 08/03/20   Rodriguez-Southworth, Nettie Elm, PA-C  pimecrolimus (ELIDEL) 1 % cream Apply topically 2 (two) times  daily. To areas on face, arms and abdomen 08/10/20   Willeen Niece, MD  dicyclomine (BENTYL) 10 MG capsule Take 1 capsule (10 mg total) by mouth every 6 (six) hours as needed for up to 7 days for spasms (abdominal pain). 04/18/20 08/03/20  Sharman Cheek, MD    Family History Family History  Problem Relation Age of Onset  . Diabetes Mother   . Hypertension Mother   . Asthma Mother   . Gallstones Mother   . Diabetes Father   . Hypertension Father   . Gallstones Father   . Bipolar disorder Sister   . COPD Brother   . Schizophrenia Brother   . COPD Brother   . Hypertension Brother     Social History Social History   Tobacco Use  . Smoking status: Never Smoker  . Smokeless tobacco: Never Used  Vaping Use  . Vaping Use: Never used  Substance Use Topics  . Alcohol use: No  . Drug use: No     Allergies   Cortizone-10 [hydrocortisone], Garlic, Prednisone, Contrast media [iodinated diagnostic agents], and Corticosteroids   Review of Systems Review of Systems  Constitutional: Positive for appetite change.  Gastrointestinal: Positive for abdominal pain.   Physical Exam Triage Vital Signs ED Triage Vitals  Enc Vitals Group     BP 10/26/20 2052 (!) 143/111     Pulse Rate 10/26/20 2052 95     Resp 10/26/20 2052 18     Temp 10/26/20 2052 99.4 F (37.4 C)     Temp Source 10/26/20 2052 Oral     SpO2 10/26/20 2052 100 %     Weight 10/26/20 2049 (!) 330 lb (149.7 kg)     Height 10/26/20 2049 5\' 10"  (1.778 m)     Head Circumference --      Peak Flow --      Pain Score 10/26/20 2049 10     Pain Loc --      Pain Edu? --      Excl. in GC? --    Updated Vital Signs BP (!) 143/111 (BP Location: Right Arm)   Pulse 95   Temp 99.4 F (37.4 C) (Oral)   Resp 18   Ht 5\' 10"  (1.778  m)   Wt (!) 149.7 kg   LMP 10/05/2020   SpO2 100%   BMI 47.35 kg/m   Visual Acuity Right Eye Distance:   Left Eye Distance:   Bilateral Distance:    Right Eye Near:   Left Eye Near:     Bilateral Near:     Physical Exam Vitals and nursing note reviewed.  Constitutional:      General: She is not in acute distress.    Appearance: She is obese. She is not ill-appearing.  HENT:     Head: Normocephalic and atraumatic.  Cardiovascular:     Rate and Rhythm: Normal rate and regular rhythm.  Pulmonary:     Effort: Pulmonary effort is normal.     Breath sounds: Normal breath sounds. No wheezing, rhonchi or rales.  Abdominal:     Palpations: Abdomen is soft.     Comments: Exquisite RLQ tenderness to palpation.   Neurological:     Mental Status: She is alert.  Psychiatric:        Mood and Affect: Mood normal.        Behavior: Behavior normal.    UC Treatments / Results  Labs (all labs ordered are listed, but only abnormal results are displayed) Labs Reviewed - No data to display  EKG   Radiology No results found.  Procedures Procedures (including critical care time)  Medications Ordered in UC Medications - No data to display  Initial Impression / Assessment and Plan / UC Course  I have reviewed the triage vital signs and the nursing notes.  Pertinent labs & imaging results that were available during my care of the patient were reviewed by me and considered in my medical decision making (see chart for details).    34 year old female presents with acute onset right lower quadrant pain.  Exquisitely tender on exam.  Concern for acute appendicitis.  Patient is going directly to the ER for further evaluation and management.  Final Clinical Impressions(s) / UC Diagnoses   Final diagnoses:  RLQ abdominal pain     Discharge Instructions     Go straight to the ER. Concern for appendicitis.  Dr. Adriana Simas    ED Prescriptions    None     PDMP not reviewed this encounter.   Tommie Sams, [place] 10/26/20 2115

## 2020-10-26 NOTE — Discharge Instructions (Signed)
Go straight to the ER. Concern for appendicitis.  Dr. Adriana Simas

## 2020-10-27 DIAGNOSIS — F410 Panic disorder [episodic paroxysmal anxiety] without agoraphobia: Secondary | ICD-10-CM | POA: Insufficient documentation

## 2020-10-27 DIAGNOSIS — I5030 Unspecified diastolic (congestive) heart failure: Secondary | ICD-10-CM | POA: Insufficient documentation

## 2020-10-27 DIAGNOSIS — Z86711 Personal history of pulmonary embolism: Secondary | ICD-10-CM | POA: Diagnosis not present

## 2020-10-27 DIAGNOSIS — R1031 Right lower quadrant pain: Secondary | ICD-10-CM | POA: Diagnosis not present

## 2020-10-27 DIAGNOSIS — R69 Illness, unspecified: Secondary | ICD-10-CM | POA: Diagnosis not present

## 2020-10-27 DIAGNOSIS — R112 Nausea with vomiting, unspecified: Secondary | ICD-10-CM | POA: Diagnosis not present

## 2020-10-27 DIAGNOSIS — R197 Diarrhea, unspecified: Secondary | ICD-10-CM | POA: Diagnosis not present

## 2020-10-27 DIAGNOSIS — R933 Abnormal findings on diagnostic imaging of other parts of digestive tract: Secondary | ICD-10-CM | POA: Diagnosis not present

## 2020-10-27 DIAGNOSIS — K5732 Diverticulitis of large intestine without perforation or abscess without bleeding: Secondary | ICD-10-CM | POA: Insufficient documentation

## 2020-10-27 DIAGNOSIS — I471 Supraventricular tachycardia: Secondary | ICD-10-CM | POA: Diagnosis not present

## 2020-10-27 DIAGNOSIS — I110 Hypertensive heart disease with heart failure: Secondary | ICD-10-CM | POA: Diagnosis not present

## 2020-10-27 DIAGNOSIS — K37 Unspecified appendicitis: Secondary | ICD-10-CM | POA: Diagnosis not present

## 2020-10-27 DIAGNOSIS — R590 Localized enlarged lymph nodes: Secondary | ICD-10-CM | POA: Diagnosis not present

## 2020-10-27 DIAGNOSIS — J4540 Moderate persistent asthma, uncomplicated: Secondary | ICD-10-CM | POA: Diagnosis not present

## 2020-10-27 DIAGNOSIS — M797 Fibromyalgia: Secondary | ICD-10-CM | POA: Diagnosis not present

## 2020-10-27 DIAGNOSIS — Z6841 Body Mass Index (BMI) 40.0 and over, adult: Secondary | ICD-10-CM | POA: Diagnosis not present

## 2020-10-27 DIAGNOSIS — K5900 Constipation, unspecified: Secondary | ICD-10-CM | POA: Diagnosis not present

## 2020-10-28 DIAGNOSIS — J4540 Moderate persistent asthma, uncomplicated: Secondary | ICD-10-CM | POA: Diagnosis not present

## 2020-10-28 DIAGNOSIS — M797 Fibromyalgia: Secondary | ICD-10-CM | POA: Diagnosis not present

## 2020-10-28 DIAGNOSIS — R109 Unspecified abdominal pain: Secondary | ICD-10-CM | POA: Diagnosis not present

## 2020-10-28 DIAGNOSIS — I5030 Unspecified diastolic (congestive) heart failure: Secondary | ICD-10-CM | POA: Diagnosis not present

## 2020-10-28 DIAGNOSIS — K5752 Diverticulitis of both small and large intestine without perforation or abscess without bleeding: Secondary | ICD-10-CM | POA: Diagnosis not present

## 2020-10-28 DIAGNOSIS — I110 Hypertensive heart disease with heart failure: Secondary | ICD-10-CM | POA: Diagnosis not present

## 2020-10-28 DIAGNOSIS — R933 Abnormal findings on diagnostic imaging of other parts of digestive tract: Secondary | ICD-10-CM | POA: Diagnosis not present

## 2020-10-28 DIAGNOSIS — N179 Acute kidney failure, unspecified: Secondary | ICD-10-CM | POA: Diagnosis not present

## 2020-10-28 DIAGNOSIS — R0902 Hypoxemia: Secondary | ICD-10-CM | POA: Diagnosis not present

## 2020-10-28 DIAGNOSIS — I471 Supraventricular tachycardia: Secondary | ICD-10-CM | POA: Diagnosis not present

## 2020-10-28 DIAGNOSIS — D72829 Elevated white blood cell count, unspecified: Secondary | ICD-10-CM | POA: Diagnosis not present

## 2020-10-29 DIAGNOSIS — M797 Fibromyalgia: Secondary | ICD-10-CM | POA: Diagnosis not present

## 2020-10-29 DIAGNOSIS — G4733 Obstructive sleep apnea (adult) (pediatric): Secondary | ICD-10-CM | POA: Diagnosis not present

## 2020-10-29 DIAGNOSIS — N179 Acute kidney failure, unspecified: Secondary | ICD-10-CM | POA: Diagnosis not present

## 2020-10-29 DIAGNOSIS — I471 Supraventricular tachycardia: Secondary | ICD-10-CM | POA: Diagnosis not present

## 2020-10-29 DIAGNOSIS — J4540 Moderate persistent asthma, uncomplicated: Secondary | ICD-10-CM | POA: Diagnosis not present

## 2020-10-29 DIAGNOSIS — I110 Hypertensive heart disease with heart failure: Secondary | ICD-10-CM | POA: Diagnosis not present

## 2020-10-29 DIAGNOSIS — K5752 Diverticulitis of both small and large intestine without perforation or abscess without bleeding: Secondary | ICD-10-CM | POA: Diagnosis not present

## 2020-10-29 DIAGNOSIS — I5030 Unspecified diastolic (congestive) heart failure: Secondary | ICD-10-CM | POA: Diagnosis not present

## 2020-11-01 DIAGNOSIS — I509 Heart failure, unspecified: Secondary | ICD-10-CM | POA: Diagnosis not present

## 2020-11-03 DIAGNOSIS — I509 Heart failure, unspecified: Secondary | ICD-10-CM | POA: Diagnosis not present

## 2020-11-04 DIAGNOSIS — G4733 Obstructive sleep apnea (adult) (pediatric): Secondary | ICD-10-CM | POA: Diagnosis not present

## 2020-11-04 DIAGNOSIS — J4540 Moderate persistent asthma, uncomplicated: Secondary | ICD-10-CM | POA: Diagnosis not present

## 2020-11-04 DIAGNOSIS — K5792 Diverticulitis of intestine, part unspecified, without perforation or abscess without bleeding: Secondary | ICD-10-CM | POA: Diagnosis not present

## 2020-11-05 DIAGNOSIS — F4312 Post-traumatic stress disorder, chronic: Secondary | ICD-10-CM | POA: Diagnosis not present

## 2020-11-05 DIAGNOSIS — F410 Panic disorder [episodic paroxysmal anxiety] without agoraphobia: Secondary | ICD-10-CM | POA: Diagnosis not present

## 2020-11-05 DIAGNOSIS — F411 Generalized anxiety disorder: Secondary | ICD-10-CM | POA: Diagnosis not present

## 2020-11-05 DIAGNOSIS — I509 Heart failure, unspecified: Secondary | ICD-10-CM | POA: Diagnosis not present

## 2020-11-05 DIAGNOSIS — R69 Illness, unspecified: Secondary | ICD-10-CM | POA: Diagnosis not present

## 2020-11-06 DIAGNOSIS — I509 Heart failure, unspecified: Secondary | ICD-10-CM | POA: Diagnosis not present

## 2020-11-08 DIAGNOSIS — I509 Heart failure, unspecified: Secondary | ICD-10-CM | POA: Diagnosis not present

## 2020-11-09 DIAGNOSIS — J45909 Unspecified asthma, uncomplicated: Secondary | ICD-10-CM | POA: Diagnosis not present

## 2020-11-10 DIAGNOSIS — I509 Heart failure, unspecified: Secondary | ICD-10-CM | POA: Diagnosis not present

## 2020-11-12 DIAGNOSIS — I509 Heart failure, unspecified: Secondary | ICD-10-CM | POA: Diagnosis not present

## 2020-11-13 DIAGNOSIS — I509 Heart failure, unspecified: Secondary | ICD-10-CM | POA: Diagnosis not present

## 2020-11-15 DIAGNOSIS — I509 Heart failure, unspecified: Secondary | ICD-10-CM | POA: Diagnosis not present

## 2020-11-17 DIAGNOSIS — I509 Heart failure, unspecified: Secondary | ICD-10-CM | POA: Diagnosis not present

## 2020-11-19 DIAGNOSIS — I509 Heart failure, unspecified: Secondary | ICD-10-CM | POA: Diagnosis not present

## 2020-11-22 DIAGNOSIS — I509 Heart failure, unspecified: Secondary | ICD-10-CM | POA: Diagnosis not present

## 2020-11-24 DIAGNOSIS — M5416 Radiculopathy, lumbar region: Secondary | ICD-10-CM | POA: Diagnosis not present

## 2020-11-24 DIAGNOSIS — G8929 Other chronic pain: Secondary | ICD-10-CM | POA: Diagnosis not present

## 2020-11-24 DIAGNOSIS — M4716 Other spondylosis with myelopathy, lumbar region: Secondary | ICD-10-CM | POA: Diagnosis not present

## 2020-11-24 DIAGNOSIS — M5126 Other intervertebral disc displacement, lumbar region: Secondary | ICD-10-CM | POA: Diagnosis not present

## 2020-11-26 DIAGNOSIS — Z0100 Encounter for examination of eyes and vision without abnormal findings: Secondary | ICD-10-CM | POA: Diagnosis not present

## 2020-12-01 DIAGNOSIS — I509 Heart failure, unspecified: Secondary | ICD-10-CM | POA: Diagnosis not present

## 2020-12-03 DIAGNOSIS — I509 Heart failure, unspecified: Secondary | ICD-10-CM | POA: Diagnosis not present

## 2020-12-10 DIAGNOSIS — I509 Heart failure, unspecified: Secondary | ICD-10-CM | POA: Diagnosis not present

## 2020-12-10 DIAGNOSIS — J45909 Unspecified asthma, uncomplicated: Secondary | ICD-10-CM | POA: Diagnosis not present

## 2020-12-15 ENCOUNTER — Encounter: Payer: Self-pay | Admitting: Internal Medicine

## 2020-12-15 DIAGNOSIS — F3162 Bipolar disorder, current episode mixed, moderate: Secondary | ICD-10-CM | POA: Diagnosis not present

## 2020-12-15 DIAGNOSIS — R69 Illness, unspecified: Secondary | ICD-10-CM | POA: Diagnosis not present

## 2020-12-15 DIAGNOSIS — I509 Heart failure, unspecified: Secondary | ICD-10-CM | POA: Diagnosis not present

## 2020-12-15 DIAGNOSIS — F411 Generalized anxiety disorder: Secondary | ICD-10-CM | POA: Diagnosis not present

## 2020-12-15 DIAGNOSIS — F4312 Post-traumatic stress disorder, chronic: Secondary | ICD-10-CM | POA: Diagnosis not present

## 2020-12-15 DIAGNOSIS — F410 Panic disorder [episodic paroxysmal anxiety] without agoraphobia: Secondary | ICD-10-CM | POA: Diagnosis not present

## 2020-12-15 DIAGNOSIS — G4733 Obstructive sleep apnea (adult) (pediatric): Secondary | ICD-10-CM

## 2020-12-16 NOTE — Procedures (Signed)
[facility]  Polysomnogram Report Part I                                                               Phone: [phone] Fax: [phone]  Patient Name: Andrea Miller, Andrea Miller Acquisition Number: [id number]  Date of Birth: [birth year] Acquisition Date: 12/15/2020  Referring Physician: Emogene Morgan, MD     History: The patient is a 35 year old female who was referred for reevaluation of obstructive sleep apnea. Medical History: obesity, fibromyalgia, obstructive sleep apnea, anxiety, borderline diabetes, asthma.  Medications: Ventolin HFA, ondansetron, amoxicillin.  Procedure: This routine overnight polysomnogram was performed on the Alice 5 using the standard diagnostic protocol. This included 6 channels of EEG, 2 channels of EOG, chin EMG, bilateral anterior tibialis EMG, nasal/oral thermistor, PTAF (nasal pressure transducer), chest and abdominal wall movements, EKG, and pulse oximetry.  Description: The total recording time was 379.3 minutes. The total sleep time was 334.3 minutes. There were a total of 43.5 minutes of wakefulness after sleep onset for a?slightly reduced???sleep efficiency of 88.1%. The latency to sleep onset was short at 1.5 minutes. The R sleep onset latency was within normal limits at 78.5 minutes.?? Sleep parameters, as a percentage of the total sleep time, demonstrated 2.7% of sleep was in N1 sleep, 62.5% N2, 17.9% N3 and 16.9% R sleep. There were a total of 22 arousals for an arousal index of 3.9 arousals per hour of sleep that was normal.???  Respiratory monitoring demonstrated frequent moderate to severe degree of snoring in all positions. There were 149 apneas and hypopneas for an Apnea Hypopnea Index of 26.7 apneas and hypopneas per hour of sleep. The REM related apnea hypopnea index was 121.1/hr of REM sleep compared to a NREM AHI of 7.6/hr. The Respiratory Disturbance Index, which includes 1 respiratory effort related arousals (RERAs), was  26.9 respiratory events per hour of sleep.  The average duration of the respiratory events was 17.2 seconds with a maximum duration of 51.0 seconds. The respiratory events occurred in all positions, however the respiratory events were more frequent in the supine position with and AHI of 34.5. The respiratory events were associated with peripheral oxygen desaturations on the average to 83%. The lowest oxygen desaturation associated with a respiratory event was 69%. Additionally, the baseline oxygen saturation during wakefulness was 94%, during NREM sleep averaged 95%, and during REM sleep averaged 91%. The total duration of oxygen < 90% was 19.8 minutes and <80% was 2.2 minutes.  Cardiac monitoring- did not demonstrate transient cardiac decelerations associated with the apneas. There were no significant cardiac rhythm irregularities.   Periodic limb movement monitoring- did not demonstrate periodic limb movements.   Impression: ???This routine overnight polysomnogram confirmed the presence of significant, REM-related obstructive sleep apnea with an overall Apnea Hypopnea Index of 26.7 apneas and hypopneas per hour of sleep, which increased to 121.0 in REM sleep. As REM percentage was reduced, the findings likely underestimate the severity of the sleep apnea. Most REM occurred in the supine position.  There was a slightly reduced sleep efficiency with a reduced percentage of REM sleep.? These findings would appear to be due to the obstructive sleep apnea.???????  ??Recommendations:    1. A CPAP  titration would be recommended due to the severity of the sleep apnea. Some supine sleep should be ensured to optimize the titration. 2. Additionally, would recommend weight loss in a patient with a BMI of 47.3.     Yevonne Pax, MD, [facility] Diplomate ABMS-Pulmonary, Critical Care and Sleep Medicine  Electronically reviewed and digitally signed          [facility] Polysomnogram  Report Part II  Phone: [phone] Fax: [phone]  Patient last name Donaghy Neck Size 16.0 in. Acquisition [id number]  Patient first name Andrea Miller Weight 330.0 lbs. Started 12/15/2020 at 11:02:36 PM  Birth date 03-Aug-1986 Height 70.0 in. Stopped 12/16/2020 at 5:27:54 AM  Age 63 BMI 47.3 lb/in2 Duration 379.3  Study Type Adult      Report generated by:  Hampton Abbot, RPSGT Sleep Data: Lights Out: 11:06:36 PM Sleep Onset: 11:08:06 PM  Lights On: 5:25:54 AM Sleep Efficiency: 88.1 %  Total Recording Time: 379.3 min Sleep Latency (from Lights Off) 1.5 min  Total Sleep Time (TST): 334.3 min R Latency (from Sleep Onset): 78.5 min  Sleep Period Time: 377.8 min Total number of awakenings: 12  Wake during sleep: 43.5 min Wake After Sleep Onset (WASO): 43.5 min   Sleep Data:         Arousal Summary: Stage  Latency from lights out (min) Latency from sleep onset (min) Duration (min) % Total Sleep Time  Normal values  N 1 1.5 0.0 9.0 2.7 (5%)  N 2 2.5 1.0 208.8 62.5 (50%)  N 3 18.0 16.5 60.0 17.9 (20%)  R 80.0 78.5 56.5 16.9 (25%)    Number Index  Spontaneous 27 4.8  Apneas & Hypopneas 15 2.7  RERAs 1 0.2       (Apneas & Hypopneas & RERAs)  (16) (2.9)  Limb Movement 0 0.0  Snore 0 0.0  TOTAL 43 7.7      Respiratory Data:  CA OA MA Apnea Hypopnea* A+ H RERA Total  Number 0 47 0 47 102 149 1 150  Mean Dur (sec) 0.0 13.3 0.0 13.3 18.9 17.2 23.5 17.2  Max Dur (sec) 0.0 32.0 0.0 32.0 51.0 51.0 23.5 51.0  Total Dur (min) 0.0 10.4 0.0 10.4 32.2 42.6 0.4 43.0  % of TST 0.0 3.1 0.0 3.1 9.6 12.7 0.1 12.9  Index (#/h TST) 0.0 8.4 0.0 8.4 18.3 26.7 0.2 26.9  *Hypopneas scored based on 4% or greater desaturation.  Sleep Stage:        REM NREM TST  AHI 121.1 7.6 26.7  RDI 121.1 7.8 26.9            Body Position Data:  Sleep (min) TST (%) REM (min) NREM (min) CA (#) OA (#) MA (#) HYP (#) AHI (#/h) RERA (#) RDI (#/h) Desat (#)  Supine 179.3 53.63 40.5 138.8 0 36 0 67  34.5 0 34.5 120  Non-Supine 155.00 46.37 16.00 139.00 0.00 11.00 0.00 35.00 17.81 1.00 18.19 57.00  Left: 98.5 29.46 11.0 87.5 0 10 0 17 16.4 0 16.4 34  Prone: 54.5 16.30 5.0 49.5 0 1 0 18 20.9 1 22.0 23  Right: 2.0 0.60 0.0 2.0 0 0 0 0 0.0 0 0.00 0  UP: 0.0 0.00 0.0 0.0 0 0 0 0 0.0 0 0.00 0     Snoring: Total number of snoring episodes  0  Total time with snoring    min (   % of sleep)   Oximetry Distribution:  WK REM NREM TOTAL  Average (%)   94 91 95 94  < 90% 0.7 18.2 0.9 19.8  < 80% 0.6 1.6 0.0 2.2  < 70% 0.6 0.0 0.0 0.6  # of Desaturations* 5 119 53 177  Desat Index (#/hour) 9.0 126.4 11.4 31.8  Desat Max (%) 10 22 13 22   Desat Max Dur (sec) 15.0 35.0 99.0 99.0  Approx Min O2 during sleep 69  Approx min O2 during a respiratory event 69  Was Oxygen added (Y/N) and final rate No:   0 LPM  *Desaturations based on 3% or greater drop from baseline.   Cheyne Stokes Breathing: None Present   Heart Rate Summary:  Average Heart Rate During Sleep 84.0 bpm      Highest Heart Rate During Sleep (95th %) 92.0 bpm      Highest Heart Rate During Sleep 146 bpm      Highest Heart Rate During Recording (TIB) 186 bpm (artifact)   Heart Rate Observations: Event Type # Events   Bradycardia 0 Lowest HR Scored: N/A  Sinus Tachycardia During Sleep 0 Highest HR Scored: N/A  Narrow Complex Tachycardia 0 Highest HR Scored: N/A  Wide Complex Tachycardia 0 Highest HR Scored: N/A  Asystole 0 Longest Pause: N/A  Atrial Fibrillation 0 Duration Longest Event: N/A  Other Arrythmias  No Type:    Periodic Limb Movement Data: (Primary legs unless otherwise noted) Total # Limb Movement 0 Limb Movement Index 0.0  Total # PLMS    PLMS Index     Total # PLMS Arousals    PLMS Arousal Index     Percentage Sleep Time with PLMS   min (   % sleep)  Mean Duration limb movements (secs)

## 2020-12-17 DIAGNOSIS — I509 Heart failure, unspecified: Secondary | ICD-10-CM | POA: Diagnosis not present

## 2020-12-17 DIAGNOSIS — Z20822 Contact with and (suspected) exposure to covid-19: Secondary | ICD-10-CM | POA: Diagnosis not present

## 2020-12-18 DIAGNOSIS — I509 Heart failure, unspecified: Secondary | ICD-10-CM | POA: Diagnosis not present

## 2020-12-20 DIAGNOSIS — I509 Heart failure, unspecified: Secondary | ICD-10-CM | POA: Diagnosis not present

## 2020-12-22 DIAGNOSIS — I509 Heart failure, unspecified: Secondary | ICD-10-CM | POA: Diagnosis not present

## 2020-12-24 DIAGNOSIS — I509 Heart failure, unspecified: Secondary | ICD-10-CM | POA: Diagnosis not present

## 2020-12-25 DIAGNOSIS — I509 Heart failure, unspecified: Secondary | ICD-10-CM | POA: Diagnosis not present

## 2020-12-27 DIAGNOSIS — I509 Heart failure, unspecified: Secondary | ICD-10-CM | POA: Diagnosis not present

## 2020-12-29 ENCOUNTER — Other Ambulatory Visit: Payer: Self-pay

## 2020-12-29 ENCOUNTER — Ambulatory Visit (INDEPENDENT_AMBULATORY_CARE_PROVIDER_SITE_OTHER): Payer: Medicare Other | Admitting: Dermatology

## 2020-12-29 DIAGNOSIS — L209 Atopic dermatitis, unspecified: Secondary | ICD-10-CM

## 2020-12-29 DIAGNOSIS — L304 Erythema intertrigo: Secondary | ICD-10-CM | POA: Diagnosis not present

## 2020-12-29 DIAGNOSIS — L219 Seborrheic dermatitis, unspecified: Secondary | ICD-10-CM | POA: Diagnosis not present

## 2020-12-29 DIAGNOSIS — L700 Acne vulgaris: Secondary | ICD-10-CM

## 2020-12-29 MED ORDER — PIMECROLIMUS 1 % EX CREA: TOPICAL_CREAM | CUTANEOUS | 3 refills | Status: DC

## 2020-12-29 MED ORDER — TRETINOIN 0.05 % EX CREA: TOPICAL_CREAM | CUTANEOUS | 3 refills | Status: DC

## 2020-12-29 MED ORDER — KETOCONAZOLE 2 % EX CREA: TOPICAL_CREAM | CUTANEOUS | 3 refills | Status: DC

## 2020-12-29 MED ORDER — MOMETASONE FUROATE 0.1 % EX CREA: TOPICAL_CREAM | CUTANEOUS | 2 refills | Status: DC

## 2020-12-29 MED ORDER — KETOCONAZOLE 2 % EX SHAM: MEDICATED_SHAMPOO | CUTANEOUS | 5 refills | Status: DC

## 2020-12-29 NOTE — Progress Notes (Signed)
Follow-Up Visit   Subjective  Andrea Miller is a 35 y.o. female who presents for the following: Follow-up (Patient has a history of eczema of the face, trunk, and extremities. She has used Elidel Cream and ketoconazole 2% shampoo in the past, but has run out. She also has used adapalene 0.3% gel to the face for acne in the past, but needs a refill.)  Adapalene didn't really help.  Her eczema meds do work   The following portions of the chart were reviewed this encounter and updated as appropriate:       Review of Systems:  No other skin or systemic complaints except as noted in HPI or Assessment and Plan.  Objective  Well appearing patient in no apparent distress; mood and affect are within normal limits.  A focused examination was performed including face, trunk, extremities. Relevant physical exam findings are noted in the Assessment and Plan.  Objective  neck, face, trunk: Mild erythema of anterior neck, hyperpigmented xerotic patch on lower back- itching in these areas  Objective  face: Enlarged pores on malar cheeks, few comedones.  Objective  Right Inframammary: Hyperpigmented patch of the right inframammary.  Objective  Scalp, upper lip/peri nasal alar: Pink patches with greasy scale.    Assessment & Plan  Atopic dermatitis, unspecified type neck, face, trunk  Cont Elidel Cream Apply qd/bid to AAs eczema. Ok to use on face and in creases. Start mometasone cream Apply to more severe areas including back qd/bid until rash improved. Avoid face, groin, axilla.  VaniCream product samples given.   Topical steroids (such as triamcinolone, fluocinolone, fluocinonide, mometasone, clobetasol, halobetasol, betamethasone, hydrocortisone) can cause thinning and lightening of the skin if they are used for too long in the same area. Your physician has selected the right strength medicine for your problem and area affected on the body. Please use your medication only as  directed by your physician to prevent side effects.     mometasone (ELOCON) 0.1 % cream - neck, face, trunk  Acne vulgaris face  Start tretinoin 0.05% cream Apply a pea-sized amount to face qhs as tolerated.  Topical retinoid medications like tretinoin/Retin-A, adapalene/Differin, tazarotene/Fabior, and Epiduo/Epiduo Forte can cause dryness and irritation when first started. Only apply a pea-sized amount to the entire affected area. Avoid applying it around the eyes, edges of mouth and creases at the nose. If you experience irritation, use a good moisturizer first and/or apply the medicine less often. If you are doing well with the medicine, you can increase how often you use it until you are applying every night. Be careful with sun protection while using this medication as it can make you sensitive to the sun. This medicine should not be used by pregnant women.    tretinoin (RETIN-A) 0.05 % cream - face  Erythema intertrigo Right Inframammary  Start Ketoconazole cream qd/bid Aas until rash improved. Start Elidel cream Apply qd/bid AA until rash improved.  ketoconazole (NIZORAL) 2 % cream - Right Inframammary  Seborrheic dermatitis Scalp, upper lip/peri nasal alar  Continue ketoconazole 2% shampoo Apply to scalp and let sit several minutes before rinsing. Start Ketoconazole 2% cream Apply qd/bid AA face until improved. Start Elidel cream qd/bid if not improving with ketoconazole cream.  Return in about 2 months (around 02/26/2021) for eczema, seb derm, also check keloid.   Wendee Beavers, CMA, am acting as scribe for Willeen Niece, MD .  Documentation: I have reviewed the above documentation for accuracy and completeness, and I  agree with the above.  Willeen Niece MD

## 2020-12-29 NOTE — Patient Instructions (Addendum)
Eczema (Atopic Dermatitis) Elidel cream - Apply 1-2 times a day as needed to face/body. Mometasone cream - Apply to more severe areas eczema 1-2 times a day as needed, including lower back. Avoid face, groin, underarms.  Seborrheic Dermatitis (scaling of face and scalp) Ketoconazole shampoo - Apply to scalp when shampooing. Let sit several minutes before rinsing. Ketoconazole cream - Apply to scaly areas on face/around nose 1-2 times a day as needed. Elidel Cream - May apply to areas on face if not improving with ketoconazole cream.  Acne  Tretinoin cream - Apply a pea-sized amount to face at night as tolered.  Intertrigo (rash under breasts) Ketoconazole cream - apply 1-2 times a day as needed. Elidel cream - apply 1-2 times a day as needed.

## 2021-01-10 ENCOUNTER — Telehealth: Payer: Self-pay | Admitting: Hematology and Oncology

## 2021-01-10 ENCOUNTER — Ambulatory Visit: Payer: Medicare Other | Admitting: Primary Care

## 2021-01-10 NOTE — Telephone Encounter (Signed)
01/10/2021 Returned pts call regarding r/s lab appt. New appt for 01/13/21 @ 2:45 in [place], pt confirmed  SRW

## 2021-01-13 ENCOUNTER — Other Ambulatory Visit: Payer: Medicare Other

## 2021-01-13 ENCOUNTER — Inpatient Hospital Stay: Payer: Medicare Other | Attending: Hematology and Oncology

## 2021-01-14 ENCOUNTER — Other Ambulatory Visit: Payer: Self-pay

## 2021-01-14 ENCOUNTER — Encounter: Payer: Medicare Other | Admitting: Primary Care

## 2021-01-14 ENCOUNTER — Telehealth: Payer: Self-pay | Admitting: Primary Care

## 2021-01-14 ENCOUNTER — Ambulatory Visit: Payer: Medicare Other | Admitting: Primary Care

## 2021-01-14 NOTE — Progress Notes (Signed)
 Err

## 2021-01-14 NOTE — Telephone Encounter (Signed)
Patient was scheduled for a phone visit with Beth NP at 3pm. I tried to call twice and was able to leave a VM this first time, second call stated VM was ful. Waynetta Sandy is aware.

## 2021-01-17 ENCOUNTER — Ambulatory Visit: Payer: Medicare HMO | Admitting: Gastroenterology

## 2021-01-21 ENCOUNTER — Encounter (INDEPENDENT_AMBULATORY_CARE_PROVIDER_SITE_OTHER): Payer: Medicare Other | Admitting: Internal Medicine

## 2021-01-21 DIAGNOSIS — G4733 Obstructive sleep apnea (adult) (pediatric): Secondary | ICD-10-CM | POA: Diagnosis not present

## 2021-01-23 NOTE — Procedures (Signed)
[facility]  Polysomnogram Report Part I  Phone: [phone] Fax: [phone]  Patient Name: Andrea Miller, Andrea Miller Acquisition Number: [id number]  Date of Birth: 1986-03-05 Acquisition Date: 01/21/2021  Referring Physician: Karie Fetch, MD     History: The patient is a 35 year old female with obstructive sleep apnea for CPAP titration. Medical History: ???obesity, fibromyalgia, sleep apnea, anxiety, borderline diabetes, asthma.??  Medications: ??Ventolin HFA, Ondansetron, amoxicillin.???  Procedure: This routine overnight polysomnogram was performed on the [facility] 5 using the standard CPAP protocol. This included 6 channels of EEG, 2 channels of EOG, chin EMG, bilateral anterior tibialis EMG, nasal/oral thermistor, PTAF (nasal pressure transducer), chest and abdominal wall movements, EKG, and pulse oximetry.  Description: The total recording time was 361.7 minutes. The total sleep time was 241.0 minutes. There were a total of 120.6 minutes of wakefulness after sleep onset for a reduced????sleep efficiency of 66.6%. The latency to sleep onset was short?at 0.1 minutes. The R sleep onset latency was short at 54.0 minutes.??? Sleep parameters, as a percentage of the total sleep time, demonstrated 0.8% of sleep was in N1 sleep, 52.3% N2, 31.3% N3 and 15.6% R sleep. There were a total of 7 arousals for an arousal index of 1.7 arousals per hour of sleep that was normal.???  Overall, there were a total of 93 respiratory events for a respiratory disturbance index, which includes apneas, hypopneas and RERAs (increased respiratory effort) of 23.2 respiratory events per hour of sleep during the pressure titration. CPAP was initiated at 6 cm H2O at lights out, 11:17 p.m. It was titrated in 1-2 cm increments for events during or near REM to the final pressure of 13 cm H2O. The apnea was controlled at pressures of 6-13 cm H2O in most of non-REM sleep, however the events persisted at the  final pressure in REM.  Additionally, the baseline oxygen saturation during wakefulness was 97%, during NREM sleep averaged 94%, and during REM sleep averaged 93%. The total duration of oxygen < 90% was 12.6 minutes and <80% was 3.1 minutes.  Cardiac monitoring- There were no significant cardiac rhythm irregularities.   Periodic limb movement monitoring- did not demonstrate periodic limb movements.   Impression: This patient's obstructive sleep apnea demonstrated significant improvement with the utilization of nasal CPAP in non-REM sleep however the apnea was not controlled at the final pressure in REM. All sleep was in the supine position.      Recommendations: 1. Would recommend utilization of auto-adjusting CPAP at 6-20 cm of H2O.      2. A [employer] Simplus small mask was used. Chin strap used during study- no. Humidifier used during study- yes.     Yevonne Pax, MD, [facility] Diplomate ABMS-Pulmonary, Critical Care and Sleep Medicine  Electronically reviewed and digitally signed     [facility] CPAP/BIPAP Polysomnogram Report Part II Phone: [phone] Fax: [phone]  Patient last name R[place]mann Neck Size 16.0 in. Acquisition [phone]  Patient first name Andrea Miller Weight 330.0 lbs. Started 01/21/2021 at 11:13:57 PM  Birth date February 21, 1986 Height 70.0 in. Stopped 01/22/2021 at 5:21:27 AM  Age 35      Type Adult BMI 47.3 lb/in2 Duration 361.7  Report generated by: Hampton Abbot, RPSGT Sleep Data: Lights Out: 11:17:51 PM Sleep Onset: 11:17:57 PM  Lights On: 5:19:33 AM Sleep Efficiency: 66.6 %  Total Recording Time: 361.7 min Sleep Latency (from Lights Off) 0.1 min  Total Sleep Time (TST): 241.0 min R Latency (from Sleep Onset): 54.0 min  Sleep Period Time: 243.5 min Total number of awakenings: 3  Wake during sleep: 2.5 min Wake After Sleep Onset (WASO): 120.6 min   Sleep Data:         Arousal Summary: Stage  Latency from lights out (min) Latency from sleep  onset (min) Duration (min) % Total Sleep Time  Normal values  N 1 0.1 0.0 2.0 0.8 (5%)  N 2 0.6 0.5 126.0 52.3 (50%)  N 3 11.6 11.5 75.5 31.3 (20%)  R 54.1 54.0 37.5 15.6 (25%)    Number Index  Spontaneous 10 2.5  Apneas & Hypopneas 7 1.7  RERAs 0 0.0       (Apneas & Hypopneas & RERAs)  (7) (1.7)  Limb Movement 0 0.0  Snore 0 0.0  TOTAL 17 4.2      Respiratory Data:  CA OA MA Apnea Hypopnea* A+ H RERA Total  Number 0 23 0 23 70 93 0 93  Mean Dur (sec) 0.0 14.2 0.0 14.2 27.6 24.3 0.0 24.3  Max Dur (sec) 0.0 28.0 0.0 28.0 49.5 49.5 0.0 49.5  Total Dur (min) 0.0 5.4 0.0 5.4 32.2 37.6 0.0 37.6  % of TST 0.0 2.3 0.0 2.3 13.4 15.6 0.0 15.6  Index (#/h TST) 0.0 5.7 0.0 5.7 17.4 23.2 0.0 23.2  *Hypopneas scored based on 4% or greater desaturation.  Sleep Stage:         REM NREM TST  AHI 80.0 12.4 23.2  RDI 80.0 12.4 23.2    Sleep (min) TST (%) REM (min) NREM (min) CA (#) OA (#) MA (#) HYP (#) AHI (#/h) RERA (#) RDI (#/h) Desat (#)  Supine 241.0 100.00 37.5 203.5 0 23 0 70 23.2 0 23.2 109  Non-Supine 0.00 0.00 0.00 0.00 0.00 0.00 0.00 0.00 0.00 0 0.00 0.00     Snoring: Total number of snoring episodes  0  Total time with snoring    min (   % of sleep)   Oximetry Distribution:             WK REM NREM TOTAL  Average (%)   97 93 94 95  < 90% 2.5 6.9 3.2 12.6  < 80% 1.3 1.3 0.5 3.1  < 70% 0.9 0.0 0.0 0.9  # of Desaturations* 3 54 53 110  Desat Index (#/hour) 1.7 86.4 15.6 27.4  Desat Max (%) 9 28 21 28   Desat Max Dur (sec) 15.0 53.0 98.0 98.0  Approx Min O2 during sleep 68  Approx min O2 during a respiratory event 68  Was Oxygen added (Y/N) and final rate No:   0 LPM  *Desaturations based on 3% or greater drop from baseline.   Cheyne Stokes Breathing: None Present   Heart Rate Summary:  Average Heart Rate During Sleep 80.5 bpm      Highest Heart Rate During Sleep (95th %) 87.0 bpm      Highest Heart Rate During Sleep 102 bpm      Highest Heart Rate  During Recording (TIB) 135 bpm       Heart Rate Observations: Event Type # Events   Bradycardia 0 Lowest HR Scored: N/A  Sinus Tachycardia During Sleep 0 Highest HR Scored: N/A  Narrow Complex Tachycardia 0 Highest HR Scored: N/A  Wide Complex Tachycardia 0 Highest HR Scored: N/A  Asystole 0 Longest Pause: N/A  Atrial Fibrillation 0 Duration Longest Event: N/A  Other Arrythmias  No Type:   Periodic Limb Movement Data: (Primary legs unless otherwise noted) Total # Limb Movement 0 Limb Movement Index 0.0  Total # PLMS    PLMS Index     Total # PLMS Arousals    PLMS Arousal Index     Percentage Sleep Time with PLMS   min (   % sleep)  Mean Duration limb movements (secs)       IPAP Level (cmH2O) EPAP Level (cmH2O) Total Duration (min) Sleep Duration (min) Sleep (%) REM (%) CA  #) OA # MA # HYP #) AHI (#/hr) RERAs # RERAs (#/hr) RDI (#/hr)  5 5 27.5 27.5 100.0 0.0 0 0 0 0 0.0 0 0.0 0.0  6 6 29.0 29.0 100.0 10.0 0 6 0 11 35.2 0 0.0 35.2  8 8  67.0 66.0 98.5 26.6 0 2 0 19 19.1 0 0.0 19.1  9 9  24.4 24.4 100.0 0.0 0 0 0 0 0.0 0 0.0 0.0  10 10 28.0 28.0 100.0 18.6 0 11 0 8 40.7 0 0.0 40.7  12 12  52.7 51.2 97.2 20.9 0 4 0 31 41.0 0 0.0 41.0  13 13 13.0 13.0 100.0 0.0 0 0 0 0 0.0 0 0.0 0.0

## 2021-01-31 NOTE — Progress Notes (Signed)
[facility] [address] [place], [place] [address]  Pulmonary Sleep Medicine   Office Visit Note  Patient Name: Andrea Miller DOB: Mar 04, 1986 MRN [id number]  I connected with  Andrea Miller on 02/01/21 by a video enabled telemedicine application and verified that I am speaking with the correct person using two identifiers.   I discussed the limitations of evaluation and management by telemedicine. The patient expressed understanding and agreed to proceed.   Chief Complaint: Obstructive Sleep Apnea visit  Brief History:  Andrea Miller is seen today for Sleep study results. She has an extensive medical history. She will be started on Auto adjusting 6-20cm H2O. Pt reports that she has a lot of daytime sleepiness and the Epworth Sleepiness Score is 23 out of 24. The patient does take naps. She knows she needs to be careful with daily activities based on sleepiness. She avoids driving for this reason. In the past she has pulled over while driving to take a nap if she begins to feel sleepy, but now avoids driving. She is hopeful that the machine will help with this daytime sleepiness.  ROS  General: (-) fever, (-) chills, (-) night sweat Nose and Sinuses: (-) nasal stuffiness or itchiness, (-) postnasal drip, (-) nosebleeds, (-) sinus trouble. Mouth and Throat: (-) sore throat, (-) hoarseness. Neck: (-) swollen glands, (-) enlarged thyroid, (-) neck pain. Respiratory: - cough, + shortness of breath, - wheezing. Neurologic: + numbness, + tingling. Psychiatric: + anxiety, + depression   Current Medication: Outpatient Encounter Medications as of 02/01/2021  Medication Sig  . diltiazem (TIAZAC) 360 MG 24 hr capsule Take by mouth.  . Adapalene 0.3 % gel Apply 1 application topically at bedtime.  Marland Kitchen albuterol (PROVENTIL) (2.5 MG/3ML) 0.083% nebulizer solution Take 3 mLs (2.5 mg total) by nebulization every 4 (four) hours as needed for wheezing or shortness of breath.  .  amphetamine-dextroamphetamine (ADDERALL XR) 15 MG 24 hr capsule TAKE 1 CAPSULE BY MOUTH EACH MORNING  . budesonide-formoterol (SYMBICORT) 160-4.5 MCG/ACT inhaler Inhale 2 puffs into the lungs 2 (two) times daily.  . buprenorphine (BUTRANS) 15 MCG/HR Place onto the skin.  . buprenorphine (SUBUTEX) 2 MG SUBL SL tablet SMARTSIG:0.5 Tablet(s) Sublingual Twice Daily PRN  . busPIRone (BUSPAR) 10 MG tablet Take 10 mg by mouth 2 (two) times daily.  (Patient not taking: Reported on 12/29/2020)  . Calcium-Magnesium 500-250 MG TABS Take 1 tablet by mouth 2 (two) times daily.   . clonazePAM (KLONOPIN) 1 MG tablet Take 1 tablet by mouth 2 (two) times a day.  . cyanocobalamin (,VITAMIN B-12,) 1000 MCG/ML injection Inject 1,000 mcg into the muscle every 30 (thirty) days.  Marland Kitchen Dexlansoprazole 30 MG capsule Take 30 mg by mouth daily.  . diclofenac Sodium (VOLTAREN) 1 % GEL Apply 2 g topically 4 (four) times daily. (Patient not taking: No sig reported)  . diltiazem (CARDIZEM CD) 360 MG 24 hr capsule Take 360 mg by mouth every morning.  Marland Kitchen ELIDEL 1 % cream Apply to aa's eczema QD-BID PRN  . escitalopram (LEXAPRO) 20 MG tablet Take 20 mg by mouth daily.  . fluticasone (FLONASE) 50 MCG/ACT nasal spray Place 2 sprays into both nostrils daily as needed for allergies.  . folic acid (FOLVITE) 1 MG tablet Take 1 mg by mouth daily.   . furosemide (LASIX) 20 MG tablet Take 40 mg by mouth 2 (two) times daily.   Marland Kitchen gabapentin (NEURONTIN) 400 MG capsule Take 400 mg by mouth 3 (three) times daily.  Marland Kitchen  hydroxypropyl methylcellulose / hypromellose (ISOPTO TEARS / GONIOVISC) 2.5 % ophthalmic solution Place 1 drop into both eyes 3 (three) times daily as needed for dry eyes.  . hydrOXYzine (ATARAX/VISTARIL) 10 MG tablet Take 10 mg by mouth 2 (two) times daily. (Patient not taking: Reported on 12/29/2020)  . Iron-Vitamin C 65-125 MG TABS Take 1 tablet by mouth 2 (two) times daily.  . isosorbide mononitrate (IMDUR) 30 MG 24 hr tablet Take 30  mg by mouth daily.  Marland Kitchen ketoconazole (NIZORAL) 2 % cream Apply to affected areas rash on face and under breasts 1-2 times a day as needed.  Marland Kitchen ketoconazole (NIZORAL) 2 % shampoo Apply to scalp when shampooing and let sit several minutes before rinsing.  . lactulose (CHRONULAC) 10 GM/15ML solution Take 30 g by mouth daily as needed for mild constipation.  Marland Kitchen lamoTRIgine (LAMICTAL) 200 MG tablet Take 1 tablet by mouth 2 (two) times a day.  Marland Kitchen LATUDA 20 MG TABS tablet TAKE 1 TABLET BY MOUTH EACH EVENING WITH FOOD.  Marland Kitchen linaclotide (LINZESS) 290 MCG CAPS capsule Take 290 mcg by mouth daily before breakfast.  . mometasone (ELOCON) 0.1 % cream Apply to more severe areas eczema 1-2 times as directed. Avoid face, groin, underarms.  . montelukast (SINGULAIR) 10 MG tablet Take 1 tablet (10 mg total) by mouth at bedtime.  . ondansetron (ZOFRAN ODT) 8 MG disintegrating tablet Take 1 tablet (8 mg total) by mouth every 8 (eight) hours as needed for nausea or vomiting.  . pimecrolimus (ELIDEL) 1 % cream Apply 1-2 times a day to affected areas rash on face/body as directed.  . Potassium Chloride ER 20 MEQ TBCR Take 20 mEq by mouth 2 (two) times daily.   Marland Kitchen PROAIR RESPICLICK 108 (90 Base) MCG/ACT AEPB INHALE 2 PUFFS INTO THE LUNGS EVERY 6 HOURS AS NEEDED.  Marland Kitchen propranolol (INDERAL) 40 MG tablet Take 2 tablets (80 mg total) by mouth 3 (three) times daily.  . risperiDONE (RISPERDAL) 3 MG tablet Take 3 mg by mouth at bedtime.   . rivaroxaban (XARELTO) 20 MG TABS tablet Take 1 tablet (20 mg total) by mouth every morning.  . rosuvastatin (CRESTOR) 20 MG tablet Take 20 mg by mouth daily.  Marland Kitchen SPIRIVA RESPIMAT 1.25 MCG/ACT AERS INHALE 2 PUFFS INTO THE LUNGS DAILY.  Marland Kitchen Topiramate ER (TROKENDI XR) 200 MG CP24 Take 1 capsule by mouth daily.   Marland Kitchen tretinoin (RETIN-A) 0.05 % cream Apply a pea-sized amount to face every night as tolerated for acne.  . vitamin C (VITAMIN C) 250 MG tablet Take 1 tablet (250 mg total) by mouth 2 (two) times  daily.  . [DISCONTINUED] dicyclomine (BENTYL) 10 MG capsule Take 1 capsule (10 mg total) by mouth every 6 (six) hours as needed for up to 7 days for spasms (abdominal pain).   No facility-administered encounter medications on file as of 02/01/2021.    Surgical History: Past Surgical History:  Procedure Laterality Date  . CHOLECYSTECTOMY N/A 02/22/2018   Procedure: LAPAROSCOPIC CHOLECYSTECTOMY;  Surgeon: Ancil Linsey, MD;  Location: [facility] ORS;  Service: General;  Laterality: N/A;  . TONSILLECTOMY AND ADENOIDECTOMY N/A 09/05/2016   Procedure: TONSILLECTOMY AND ADENOIDECTOMY;  Surgeon: Linus Salmons, MD;  Location: [facility] ORS;  Service: ENT;  Laterality: N/A;    Medical History: Past Medical History:  Diagnosis Date  . Anxiety   . Asthma   . Bipolar disorder (HCC)   . Chest pain    and angina  . CHF (congestive heart failure) (HCC)   .  Depression   . Fibromyalgia   . Hypertension   . 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 Obesity   . Pre-diabetes   . Pulmonary embolus (HCC)   . Seizures (HCC)    secondary to anxiety  last seizure 01/25/18  . SVT (supraventricular tachycardia) (HCC)    with hx of syncope; managed by [facility]    Family History: Non contributory to the present illness  Social History: Social History   Socioeconomic History  . Marital status: Divorced    Spouse name: Not on file  . Number of children: Not on file  . Years of education: Not on file  . Highest education level: Not on file  Occupational History  . Not on file  Tobacco Use  . Smoking status: Never Smoker  . Smokeless tobacco: Never Used  Vaping Use  . Vaping Use: Never used  Substance and Sexual Activity  . Alcohol use: No  . Drug use: No  . Sexual activity: Yes    Birth control/protection: None  Other Topics Concern  . Not on file  Social History Narrative  . Not on file   Social Determinants of Health   Financial Resource Strain: Not on file   Food Insecurity: Not on file  Transportation Needs: Not on file  Physical Activity: Not on file  Stress: Not on file  Social Connections: Not on file  Intimate Partner Violence: Not on file    Vital Signs: There were no vitals taken for this visit.  Examination: General Appearance: The patient is well-developed, well-nourished, and in no distress. Neck Circumference:  Skin: Gross inspection of skin unremarkable. Head: normocephalic, no gross deformities. Eyes: no gross deformities noted. ENT: ears appear grossly normal Neurologic: Alert and oriented. No involuntary movements.    EPWORTH SLEEPINESS SCALE:  Scale:  (0)= no chance of dozing; (1)= slight chance of dozing; (2)= moderate chance of dozing; (3)= high chance of dozing  Chance  Situtation    Sitting and reading: 3    Watching TV: 3    Sitting Inactive in public: 3    As a passenger in car: 3      Lying down to rest: 3    Sitting and talking: 2    Sitting quielty after lunch: 3    In a car, stopped in traffic: 3   TOTAL SCORE:   23 out of 24    SLEEP STUDIES:  1. PSG 12/15/20 AHI 27 Spo89min 95%           LABS: No results found for this or any previous visit (from the past 2160 hour(s)).  Radiology: No results found.  No results found.  No results found.    Assessment and Plan: Patient Active Problem List   Diagnosis Date Noted  . Chronic anticoagulation 05/07/2019  . Iron deficiency anemia due to chronic blood loss 10/15/2018  . B12 deficiency 10/13/2018  . Symptomatic anemia 10/11/2018  . Multiple subsegmental pulmonary emboli without acute cor pulmonale (HCC) 09/15/2018  . Morbid obesity with BMI of 50.0-59.9, adult (HCC) 09/15/2018  . Chronic diastolic heart failure (HCC) 07/01/2018  . Lymphedema 07/01/2018  . Chronic cholecystitis   . Edema 02/13/2018  . Diabetes mellitus type 2, uncomplicated (HCC) 02/13/2018  . Hypertension 11/23/2017  . Asthma exacerbation  04/14/2017  . Asthma without status asthmaticus 01/16/2017  . Bipolar affective (HCC) 01/16/2017  . Coronary artery disease 01/16/2017  . Syncope 12/12/2016  . Fibromyalgia 04/27/2014  .  OSA (obstructive sleep apnea) 04/27/2014  . Seizure-like activity (HCC) 04/27/2014  . Migraine without status migrainosus, not intractable 04/27/2014  . Obesity 04/15/2014  . Sleep disorder 04/15/2014  . Neck pain 04/15/2014  . Headache 04/15/2014  . Restless legs syndrome 04/15/2014  . Supraventricular tachycardia (HCC) 07/13/2009       1. OSA (obstructive sleep apnea) Will be set up on auto-adjusting CPAP 6-20 cm of H2O. Follow up 30+ days after set up.  2. Primary hypertension Continue current medications.  3. Supraventricular tachycardia (HCC) Followed by cardiology.  4. Chronic diastolic heart failure (HCC) Followed by cardiology, EF 55-60% on echo in 2019.  5. Mild intermittent asthma with exacerbation Continue inhalers as prescribed and f/u with pulmonology.  6. Bipolar affective disorder, remission status unspecified (HCC) Continue current medications and f/u with PCP/psych.   General Counseling: I have discussed the findings of the evaluation and examination with [place].  I have also discussed any further diagnostic evaluation thatmay be needed or ordered today. Itsel verbalizes understanding of the findings of todays visit. We also reviewed her medications today and discussed drug interactions and side effects including but not limited excessive drowsiness and altered mental states. We also discussed that there is always a risk not just to her but also people around her. she has been encouraged to call the office with any questions or concerns that should arise related to todays visit.  No orders of the defined types were placed in this encounter.      I have personally obtained a history, examined the patient, evaluated laboratory and imaging results, formulated the  assessment and plan and placed orders.  This patient was seen by Lynn Ito, PA-C in collaboration with Dr. Freda Munro as a part of collaborative care agreement.   Valentino Hue Sol Blazing, PhD, FAASM  Diplomate, American Board of Sleep Medicine    Yevonne Pax, MD [facility] Diplomate ABMS Pulmonary and Critical Care Medicine Sleep medicine

## 2021-02-01 ENCOUNTER — Ambulatory Visit (INDEPENDENT_AMBULATORY_CARE_PROVIDER_SITE_OTHER): Payer: Medicare Other | Admitting: Internal Medicine

## 2021-02-01 DIAGNOSIS — G4733 Obstructive sleep apnea (adult) (pediatric): Secondary | ICD-10-CM

## 2021-02-01 DIAGNOSIS — I471 Supraventricular tachycardia: Secondary | ICD-10-CM

## 2021-02-01 DIAGNOSIS — I10 Essential (primary) hypertension: Secondary | ICD-10-CM

## 2021-02-01 DIAGNOSIS — F319 Bipolar disorder, unspecified: Secondary | ICD-10-CM

## 2021-02-01 DIAGNOSIS — J4521 Mild intermittent asthma with (acute) exacerbation: Secondary | ICD-10-CM

## 2021-02-01 DIAGNOSIS — I5032 Chronic diastolic (congestive) heart failure: Secondary | ICD-10-CM

## 2021-02-03 ENCOUNTER — Other Ambulatory Visit: Payer: Self-pay

## 2021-02-03 ENCOUNTER — Emergency Department
Admission: EM | Admit: 2021-02-03 | Discharge: 2021-02-04 | Disposition: A | Discharge status: Home / Self Care | Payer: Medicare Other | Attending: Emergency Medicine | Admitting: Emergency Medicine

## 2021-02-03 ENCOUNTER — Emergency Department: Payer: Medicare Other

## 2021-02-03 DIAGNOSIS — M25512 Pain in left shoulder: Secondary | ICD-10-CM | POA: Diagnosis not present

## 2021-02-03 DIAGNOSIS — R202 Paresthesia of skin: Secondary | ICD-10-CM | POA: Insufficient documentation

## 2021-02-03 DIAGNOSIS — Z5321 Procedure and treatment not carried out due to patient leaving prior to being seen by health care provider: Secondary | ICD-10-CM | POA: Diagnosis not present

## 2021-02-03 LAB — COMPREHENSIVE METABOLIC PANEL
ALT: 9 U/L (ref 0–44)
AST: 13 U/L — ABNORMAL LOW (ref 15–41)
Albumin: 4 g/dL (ref 3.5–5.0)
Alkaline Phosphatase: 66 U/L (ref 38–126)
Anion gap: 5 (ref 5–15)
BUN: 9 mg/dL (ref 6–20)
CO2: 28 mmol/L (ref 22–32)
Calcium: 9 mg/dL (ref 8.9–10.3)
Chloride: 105 mmol/L (ref 98–111)
Creatinine, Ser: 0.63 mg/dL (ref 0.44–1.00)
GFR, Estimated: >60 mL/min (ref >60)
Glucose, Bld: 97 mg/dL (ref 70–99)
Potassium: 3.2 mmol/L — ABNORMAL LOW (ref 3.5–5.1)
Sodium: 138 mmol/L (ref 135–145)
Total Bilirubin: 0.6 mg/dL (ref 0.3–1.2)
Total Protein: 7.4 g/dL (ref 6.5–8.1)

## 2021-02-03 LAB — CBC WITH DIFFERENTIAL/PLATELET
Abs Immature Granulocytes: 0.01 10*3/uL (ref 0.00–0.07)
Basophils Absolute: 0 10*3/uL (ref 0.0–0.1)
Basophils Relative: 1 %
Eosinophils Absolute: 0.1 10*3/uL (ref 0.0–0.5)
Eosinophils Relative: 1 %
HCT: 37.5 % (ref 36.0–46.0)
Hemoglobin: 11.7 g/dL — ABNORMAL LOW (ref 12.0–15.0)
Immature Granulocytes: 0 %
Lymphocytes Relative: 47 %
Lymphs Abs: 2.5 10*3/uL (ref 0.7–4.0)
MCH: 26.5 pg (ref 26.0–34.0)
MCHC: 31.2 g/dL (ref 30.0–36.0)
MCV: 85 fL (ref 80.0–100.0)
Monocytes Absolute: 0.4 10*3/uL (ref 0.1–1.0)
Monocytes Relative: 8 %
Neutro Abs: 2.3 10*3/uL (ref 1.7–7.7)
Neutrophils Relative %: 43 %
Platelets: 258 10*3/uL (ref 150–400)
RBC: 4.41 MIL/uL (ref 3.87–5.11)
RDW: 14.4 % (ref 11.5–15.5)
WBC: 5.3 10*3/uL (ref 4.0–10.5)
nRBC: 0 % (ref 0.0–0.2)

## 2021-02-03 LAB — TROPONIN I (HIGH SENSITIVITY): Troponin I (High Sensitivity): 3 ng/L (ref <18)

## 2021-02-03 NOTE — ED Triage Notes (Signed)
abd pain with hx of diverticulitis and states pain feels the same. L side facial numbness onset either yesterday evening or this AM per pt report. Reports numbness initially onset after cream usage around L lip and has now spread to rest of L side of face. Reports h/a and dizziness as well. Pupils equal and reactive. Patient alert and oriented following commands appropriately. Patient also reporting R knee pain.

## 2021-02-03 NOTE — ED Triage Notes (Signed)
EMS brings pt in from home for c/o numbness to left side of face and pain to left shoulder since 8pm; neurocheck WNL and pt ambulatory

## 2021-02-04 NOTE — ED Notes (Signed)
Pt declines 2nd troponin, st leaving now due to wait time

## 2021-02-07 ENCOUNTER — Ambulatory Visit
Admission: EM | Admit: 2021-02-07 | Discharge: 2021-02-07 | Disposition: A | Discharge status: Home / Self Care | Payer: Medicare Other | Attending: Physician Assistant | Admitting: Physician Assistant

## 2021-02-07 ENCOUNTER — Ambulatory Visit (INDEPENDENT_AMBULATORY_CARE_PROVIDER_SITE_OTHER): Payer: Medicare Other

## 2021-02-07 ENCOUNTER — Encounter: Payer: Self-pay | Admitting: Emergency Medicine

## 2021-02-07 ENCOUNTER — Other Ambulatory Visit: Payer: Self-pay

## 2021-02-07 DIAGNOSIS — K047 Periapical abscess without sinus: Secondary | ICD-10-CM | POA: Insufficient documentation

## 2021-02-07 DIAGNOSIS — M25512 Pain in left shoulder: Secondary | ICD-10-CM | POA: Insufficient documentation

## 2021-02-07 DIAGNOSIS — K121 Other forms of stomatitis: Secondary | ICD-10-CM | POA: Insufficient documentation

## 2021-02-07 DIAGNOSIS — M25561 Pain in right knee: Secondary | ICD-10-CM

## 2021-02-07 DIAGNOSIS — R1011 Right upper quadrant pain: Secondary | ICD-10-CM

## 2021-02-07 LAB — URINALYSIS, COMPLETE (UACMP) WITH MICROSCOPIC
Bilirubin Urine: NEGATIVE
Glucose, UA: NEGATIVE mg/dL
Hgb urine dipstick: NEGATIVE
Ketones, ur: NEGATIVE mg/dL
Leukocytes,Ua: NEGATIVE
Nitrite: NEGATIVE
Protein, ur: NEGATIVE mg/dL
Specific Gravity, Urine: 1.025 (ref 1.005–1.030)
pH: 5.5 (ref 5.0–8.0)

## 2021-02-07 LAB — CBC WITH DIFFERENTIAL/PLATELET
Abs Immature Granulocytes: 0.02 10*3/uL (ref 0.00–0.07)
Basophils Absolute: 0 10*3/uL (ref 0.0–0.1)
Basophils Relative: 1 %
Eosinophils Absolute: 0 10*3/uL (ref 0.0–0.5)
Eosinophils Relative: 1 %
HCT: 44.5 % (ref 36.0–46.0)
Hemoglobin: 13.8 g/dL (ref 12.0–15.0)
Immature Granulocytes: 1 %
Lymphocytes Relative: 42 %
Lymphs Abs: 1.8 10*3/uL (ref 0.7–4.0)
MCH: 26.5 pg (ref 26.0–34.0)
MCHC: 31 g/dL (ref 30.0–36.0)
MCV: 85.4 fL (ref 80.0–100.0)
Monocytes Absolute: 0.3 10*3/uL (ref 0.1–1.0)
Monocytes Relative: 8 %
Neutro Abs: 2.1 10*3/uL (ref 1.7–7.7)
Neutrophils Relative %: 47 %
Platelets: 256 10*3/uL (ref 150–400)
RBC: 5.21 MIL/uL — ABNORMAL HIGH (ref 3.87–5.11)
RDW: 14.6 % (ref 11.5–15.5)
WBC: 4.3 10*3/uL (ref 4.0–10.5)
nRBC: 0 % (ref 0.0–0.2)

## 2021-02-07 LAB — PREGNANCY, URINE: Preg Test, Ur: NEGATIVE

## 2021-02-07 MED ORDER — AMOXICILLIN-POT CLAVULANATE 875-125 MG PO TABS: 1.0000 | ORAL_TABLET | Freq: Two times a day (BID) | ORAL | 0 refills | Status: AC

## 2021-02-07 NOTE — ED Triage Notes (Addendum)
Patient in today c/o right knee pain, back pain and left shoulder pain after falling 2-3 weeks ago.  Patient also c/o a dental abscess x 2 days. Patient states she had fever (99) when she went to the ED 2-3 days ago.  Patient also c/o abdominal pain x 2-3 days.

## 2021-02-07 NOTE — Discharge Instructions (Addendum)
DENTAL INFECTION: Follow up with dentist at the next available appointment.  In the meantime, we will cover for dental infection with Augmentin.  Take Tylenol for pain relief. Consider Orajel also. May ice the area. Follow up with dentist in the next few days. In some cases, the tooth may needed to be extracted. Follow up with [place] or ER sooner if the condition worsens before the dental appointment. If they develop a fever, significant soft tissue swelling, or worse pain, go to ER  ABDOMINAL PAIN: You may take Tylenol for pain relief. Use medications as directed including antiemetics and antidiarrheal medications if suggested or prescribed. You should increase fluids and electrolytes as well as rest over these next several days. If you have any questions or concerns, or if your symptoms are not improving or if especially if they acutely worsen, please call or stop back to the clinic immediately and we will be happy to help you or go to the ER   ABDOMINAL PAIN RED FLAGS: Seek immediate further care if: symptoms remain the same or worsen over the next 3-7 days, you are unable to keep fluids down, you see blood or mucus in your stool, you vomit black or dark red material, you have a fever of 101.F or higher, you have localized and/or persistent abdominal pain    MUSCULOSKELETAL PAIN: Your x-rays are normal.  You have been given a sling and an ACE wrap for your knee.  Ice these areas and take Tylenol for pain relief.  Follow-up with orthopedics if you are not improving over the next 7 to 10 days.

## 2021-02-07 NOTE — ED Provider Notes (Addendum)
[facility]    CSN: [id number] Arrival date & time: 02/07/21  1339      History   Chief Complaint Chief Complaint  Patient presents with  . Fall  . Knee Pain    right  . Back Pain  . Shoulder Pain    left  . Abscess    oral    HPI Andrea Miller is a 35 y.o. female presenting with multiple complaints.  First she states that she has had right knee pain and left shoulder pain for about 2 to 3 weeks since she fell.  She says that she slipped and fell.  Patient states she initially had left-sided knee pain as well but that has resolved.  She does have full range of motion of the shoulder and says that it does hurt when she moves it though.  She does have full range of motion of the knee but says it does hurt when she stretches it out.  Admits to some pain on weightbearing.  Denies any numbness, weakness or tingling.  Patient has chronic back pain that is not worse since falling.  No radiation of pain down the extremities.  Patient requesting x-rays today.  Additionally, she says that she has had pain of the right third molar in the gums surrounding it.  She also has an ulceration in her mouth she says this has been going on about 2 to 3 days and she has not been eating or drinking very much because of the pain.  She says she has a Education officer, community, but they do not sedate there and so she was advised to go somewhere else and she has had anxiety with dental procedures.  Next, patient complains of right upper quadrant pain for about 2 to 3 days.  She says she went to the ED for this a couple of days ago and left due to the wait time.  She says that the pain is remained steady since then.  She says it is located in the same area as when she had a flareup of diverticulitis in December 2021.  She went to the ER and was admitted for this.  She has felt nauseous but not any vomiting or diarrhea.  Patient has been taking NSAIDs and Tylenol for the dental pain and abdominal pain.  She says it does  not seem to help that much.  She denies any lower abdominal pain, urinary frequency or urgency.  No vaginal discharge or concern for STIs.  No additional complaints or concerns today.  Patient does have a complicated past medical history significant for type 2 diabetes, CHF, previous history of PE and currently taking Xarelto, obesity, fibromyalgia, depression, anxiety, bipolar disorder, asthma, OSA, hypertension.  Surgical history significant for cholecystectomy and tonsillectomy.  HPI  Past Medical History:  Diagnosis Date  . Anxiety   . Asthma   . Bipolar disorder (HCC)   . Chest pain    and angina  . CHF (congestive heart failure) (HCC)   . Depression   . Fibromyalgia   . Hypertension   . 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 Obesity   . Pre-diabetes   . Pulmonary embolus (HCC)   . Seizures (HCC)    secondary to anxiety  last seizure 01/25/18  . SVT (supraventricular tachycardia) (HCC)    with hx of syncope; managed by [facility]    Patient Active Problem List   Diagnosis Date Noted  . Chronic anticoagulation  05/07/2019  . Iron deficiency anemia due to chronic blood loss 10/15/2018  . B12 deficiency 10/13/2018  . Symptomatic anemia 10/11/2018  . Multiple subsegmental pulmonary emboli without acute cor pulmonale (HCC) 09/15/2018  . Morbid obesity with BMI of 50.0-59.9, adult (HCC) 09/15/2018  . Chronic diastolic heart failure (HCC) 07/01/2018  . Lymphedema 07/01/2018  . Chronic cholecystitis   . Edema 02/13/2018  . Diabetes mellitus type 2, uncomplicated (HCC) 02/13/2018  . Hypertension 11/23/2017  . Asthma exacerbation 04/14/2017  . Asthma without status asthmaticus 01/16/2017  . Bipolar affective (HCC) 01/16/2017  . Coronary artery disease 01/16/2017  . Syncope 12/12/2016  . Fibromyalgia 04/27/2014  . OSA (obstructive sleep apnea) 04/27/2014  . Seizure-like activity (HCC) 04/27/2014  . Migraine without status migrainosus, not  intractable 04/27/2014  . Obesity 04/15/2014  . Sleep disorder 04/15/2014  . Neck pain 04/15/2014  . Headache 04/15/2014  . Restless legs syndrome 04/15/2014  . Supraventricular tachycardia (HCC) 07/13/2009    Past Surgical History:  Procedure Laterality Date  . CHOLECYSTECTOMY N/A 02/22/2018   Procedure: LAPAROSCOPIC CHOLECYSTECTOMY;  Surgeon: Ancil Linsey, MD;  Location: [facility] ORS;  Service: General;  Laterality: N/A;  . TONSILLECTOMY AND ADENOIDECTOMY N/A 09/05/2016   Procedure: TONSILLECTOMY AND ADENOIDECTOMY;  Surgeon: Linus Salmons, MD;  Location: [facility] ORS;  Service: ENT;  Laterality: N/A;    OB History    Gravida  0   Para  0   Term  0   Preterm  0   AB  0   Living  0     SAB  0   IAB  0   Ectopic  0   Multiple  0   Live Births               Home Medications    Prior to Admission medications   Medication Sig Start Date End Date Taking? Authorizing Provider  albuterol (PROVENTIL) (2.5 MG/3ML) 0.083% nebulizer solution Take 3 mLs (2.5 mg total) by nebulization every 4 (four) hours as needed for wheezing or shortness of breath. 09/23/19 10/26/20 Yes Salena Saner, MD  amphetamine-dextroamphetamine (ADDERALL XR) 15 MG 24 hr capsule TAKE 1 CAPSULE BY MOUTH EACH MORNING 12/15/20  Yes [provider]  budesonide-formoterol (SYMBICORT) 160-4.5 MCG/ACT inhaler Inhale 2 puffs into the lungs 2 (two) times daily. 07/16/20 10/26/20 Yes Glenford Bayley, NP  buprenorphine Lavera Guise) 15 MCG/HR Place onto the skin. 10/01/20  Yes [provider]  Calcium-Magnesium 500-250 MG TABS Take 1 tablet by mouth 2 (two) times daily.    Yes [provider]  cyanocobalamin (,VITAMIN B-12,) 1000 MCG/ML injection Inject 1,000 mcg into the muscle every 30 (thirty) days.   Yes [provider]  Dexlansoprazole 30 MG capsule Take 30 mg by mouth daily.   Yes [provider]  diltiazem (CARDIZEM CD) 360 MG 24 hr capsule Take 360 mg by  mouth every morning. 12/18/18  Yes [provider]  ELIDEL 1 % cream Apply to aa's eczema QD-BID PRN 01/20/20  Yes [provider]  fluticasone (FLONASE) 50 MCG/ACT nasal spray Place 2 sprays into both nostrils daily as needed for allergies. 07/16/20  Yes Glenford Bayley, NP  furosemide (LASIX) 20 MG tablet Take 40 mg by mouth 2 (two) times daily.    Yes [provider]  gabapentin (NEURONTIN) 400 MG capsule Take 400 mg by mouth 3 (three) times daily.   Yes [provider]  hydroxypropyl methylcellulose / hypromellose (ISOPTO TEARS / GONIOVISC) 2.5 % ophthalmic  solution Place 1 drop into both eyes 3 (three) times daily as needed for dry eyes.   Yes [provider]  hydrOXYzine (ATARAX/VISTARIL) 10 MG tablet Take 10 mg by mouth 2 (two) times daily. 11/18/19  Yes [provider]  Iron-Vitamin C 65-125 MG TABS Take 1 tablet by mouth 2 (two) times daily. 09/19/19  Yes Corcoran, Ferdie Ping, MD  ketoconazole (NIZORAL) 2 % cream Apply to affected areas rash on face and under breasts 1-2 times a day as needed. 12/29/20  Yes Willeen Niece, MD  ketoconazole (NIZORAL) 2 % shampoo Apply to scalp when shampooing and let sit several minutes before rinsing. 12/29/20  Yes Willeen Niece, MD  lactulose [facility]) 10 GM/15ML solution Take 30 g by mouth daily as needed for mild constipation. 08/28/18  Yes [provider]  lamoTRIgine (LAMICTAL) 200 MG tablet Take 1 tablet by mouth 2 (two) times a day. 05/20/19  Yes [provider]  LATUDA 20 MG TABS tablet TAKE 1 TABLET BY MOUTH EACH EVENING WITH FOOD. 12/15/20  Yes [provider]  linaclotide (LINZESS) 290 MCG CAPS capsule Take 290 mcg by mouth daily before breakfast.   Yes [provider]  mometasone (ELOCON) 0.1 % cream Apply to more severe areas eczema 1-2 times as directed. Avoid face, groin, underarms. 12/29/20  Yes Willeen Niece, MD  montelukast (SINGULAIR) 10 MG tablet Take 1 tablet  (10 mg total) by mouth at bedtime. 07/16/20  Yes Glenford Bayley, NP  ondansetron (ZOFRAN ODT) 8 MG disintegrating tablet Take 1 tablet (8 mg total) by mouth every 8 (eight) hours as needed for nausea or vomiting. 08/03/20  Yes Rodriguez-Southworth, Nettie Elm, PA-C  pimecrolimus (ELIDEL) 1 % cream Apply 1-2 times a day to affected areas rash on face/body as directed. 12/29/20  Yes Willeen Niece, MD  Potassium Chloride ER 20 MEQ TBCR Take 20 mEq by mouth 2 (two) times daily.    Yes [provider]  PROAIR RESPICLICK 108 (90 Base) MCG/ACT AEPB INHALE 2 PUFFS INTO THE LUNGS EVERY 6 HOURS AS NEEDED. 07/16/20  Yes Salena Saner, MD  propranolol (INDERAL) 40 MG tablet Take 2 tablets (80 mg total) by mouth 3 (three) times daily. 10/13/18  Yes Salary, Evelena Asa, MD  rivaroxaban (XARELTO) 20 MG TABS tablet Take 1 tablet (20 mg total) by mouth every morning. 04/29/20  Yes Corcoran, Ferdie Ping, MD  rosuvastatin (CRESTOR) 20 MG tablet Take 20 mg by mouth daily.   Yes [provider]  SPIRIVA RESPIMAT 1.25 MCG/ACT AERS INHALE 2 PUFFS INTO THE LUNGS DAILY. 08/31/20  Yes Glenford Bayley, NP  Topiramate ER (TROKENDI XR) 200 MG CP24 Take 1 capsule by mouth daily.    Yes [provider]  tretinoin (RETIN-A) 0.05 % cream Apply a pea-sized amount to face every night as tolerated for acne. 12/29/20  Yes Willeen Niece, MD  vitamin C (VITAMIN C) 250 MG tablet Take 1 tablet (250 mg total) by mouth 2 (two) times daily. 10/13/18  Yes Salary, Montell D, MD  Adapalene 0.3 % gel Apply 1 application topically at bedtime. 10/20/19   [provider]  buprenorphine (SUBUTEX) 2 MG SUBL SL tablet SMARTSIG:0.5 Tablet(s) Sublingual Twice Daily PRN 03/12/20   [provider]  busPIRone (BUSPAR) 10 MG tablet Take 10 mg by mouth 2 (two) times daily.  Patient not taking: No sig reported 10/22/19   [provider]  clonazePAM (KLONOPIN) 1 MG tablet Take 1 tablet by mouth 2 (two) times a day.  04/02/19   [provider]  diclofenac Sodium (VOLTAREN) 1 % GEL Apply 2 g topically 4 (four) times daily. Patient not taking: No sig reported 03/12/20   Dartha Lodge, PA-C  diltiazem [facility]) 360 MG 24 hr capsule Take by mouth. 10/18/20   [provider]  escitalopram (LEXAPRO) 20 MG tablet Take 20 mg by mouth daily.    [provider]  folic acid (FOLVITE) 1 MG tablet Take 1 mg by mouth daily.  12/15/16   [provider]  isosorbide mononitrate (IMDUR) 30 MG 24 hr tablet Take 30 mg by mouth daily. 03/08/20   [provider]  risperiDONE (RISPERDAL) 3 MG tablet Take 3 mg by mouth at bedtime.     [provider]  dicyclomine (BENTYL) 10 MG capsule Take 1 capsule (10 mg total) by mouth every 6 (six) hours as needed for up to 7 days for spasms (abdominal pain). 04/18/20 08/03/20  Sharman Cheek, MD    Family History Family History  Problem Relation Age of Onset  . Diabetes Mother   . Hypertension Mother   . Asthma Mother   . Gallstones Mother   . Diabetes Father   . Hypertension Father   . Gallstones Father   . Bipolar disorder Sister   . COPD Brother   . Schizophrenia Brother   . COPD Brother   . Hypertension Brother     Social History Social History   Tobacco Use  . Smoking status: Never Smoker  . Smokeless tobacco: Never Used  Vaping Use  . Vaping Use: Never used  Substance Use Topics  . Alcohol use: Never  . Drug use: No     Allergies   Cortizone-10 [hydrocortisone], Garlic, Prednisone, Contrast media [iodinated diagnostic agents], and Corticosteroids   Review of Systems Review of Systems  Constitutional: Positive for appetite change. Negative for fatigue and fever.  HENT: Positive for dental problem. Negative for congestion and facial swelling.   Respiratory: Negative for cough and shortness of breath.   Cardiovascular: Negative for chest pain.  Gastrointestinal: Positive for abdominal pain and nausea. Negative  for blood in stool, constipation, diarrhea and vomiting.  Genitourinary: Negative for dysuria, frequency and vaginal discharge.  Musculoskeletal: Positive for arthralgias and back pain. Negative for joint swelling.  Skin: Negative for rash.  Neurological: Negative for weakness and headaches.     Physical Exam Triage Vital Signs ED Triage Vitals  Enc Vitals Group     BP 02/07/21 1425 133/72     Pulse Rate 02/07/21 1425 83     Resp 02/07/21 1425 18     Temp 02/07/21 1425 98.3 F (36.8 C)     Temp Source 02/07/21 1425 Oral     SpO2 02/07/21 1425 100 %     Weight 02/07/21 1426 (!) 330 lb (149.7 kg)     Height 02/07/21 1426 5\' 10"  (1.778 m)     Head Circumference --      Peak Flow --      Pain Score 02/07/21 1424 9     Pain Loc --      Pain Edu? --      Excl. in GC? --    No data found.  Updated Vital Signs BP 133/72 (BP Location: Left Arm)   Pulse 83   Temp 98.3 F (36.8 C) (Oral)   Resp 18   Ht 5\' 10"  (1.778 m)   Wt (!) 330 lb (149.7 kg)   LMP 02/03/2021 (Exact Date)   SpO2  100%   BMI 47.35 kg/m       Physical Exam Vitals and nursing note reviewed.  Constitutional:      General: She is not in acute distress.    Appearance: Normal appearance. She is not ill-appearing or toxic-appearing.  HENT:     Head: Normocephalic and atraumatic.     Nose: Nose normal.     Mouth/Throat:     Mouth: Mucous membranes are moist.     Pharynx: Oropharynx is clear.      Comments: In the area noted above there is a large ulceration with surrounding erythema.  There is also erythema and swelling surrounding the third more.  Tooth is tender to touch and is decayed. Eyes:     General: No scleral icterus.       Right eye: No discharge.        Left eye: No discharge.     Conjunctiva/sclera: Conjunctivae normal.  Cardiovascular:     Rate and Rhythm: Normal rate and regular rhythm.     Heart sounds: Normal heart sounds.  Pulmonary:     Effort: Pulmonary effort is normal. No  respiratory distress.     Breath sounds: Normal breath sounds.  Abdominal:     Palpations: Abdomen is soft.     Tenderness: There is abdominal tenderness (RUQ). There is no right CVA tenderness, left CVA tenderness, guarding or rebound.  Musculoskeletal:     Cervical back: Neck supple.     Comments: Left shoulder: Poorly localized and diffuse tenderness throughout the shoulder with most of the tenderness being throughout the posterior scapular region.  Full range of motion of the shoulder but says she does have pain with full flexion and abduction.  Right knee: Poorly localized and diffuse tenderness throughout the anterior knee and over the patella.  Forage motion of any but says she does have pain with full flexion and extension.  Good strength and sensation.  Skin:    General: Skin is dry.  Neurological:     General: No focal deficit present.     Mental Status: She is alert. Mental status is at baseline.     Motor: No weakness.     Gait: Gait normal.  Psychiatric:        Mood and Affect: Mood normal.        Behavior: Behavior normal.        Thought Content: Thought content normal.      UC Treatments / Results  Labs (all labs ordered are listed, but only abnormal results are displayed) Labs Reviewed  URINALYSIS, COMPLETE (UACMP) WITH MICROSCOPIC - Abnormal; Notable for the following components:      Result Value   Bacteria, UA MANY (*)    All other components within normal limits  CBC WITH DIFFERENTIAL/PLATELET - Abnormal; Notable for the following components:   RBC 5.21 (*)    All other components within normal limits  PREGNANCY, URINE    EKG   Radiology DG Shoulder Left  Result Date: 02/07/2021 CLINICAL DATA:  Persistent pain after fall EXAM: LEFT SHOULDER - 2+ VIEW COMPARISON:  None. FINDINGS: There is no evidence of fracture or dislocation. There is no evidence of arthropathy or other focal bone abnormality. Soft tissues are unremarkable. IMPRESSION: Negative left  shoulder radiograph. Electronically Signed   By: Maudry Mayhew MD   On: 02/07/2021 16:51   DG Knee Complete 4 Views Right  Result Date: 02/07/2021 CLINICAL DATA:  Fall 2-3 weeks ago persistent right knee pain. EXAM:  RIGHT KNEE - COMPLETE 4+ VIEW COMPARISON:  None. FINDINGS: No evidence of fracture, dislocation, or joint effusion. No evidence of arthropathy or other focal bone abnormality. Soft tissues are unremarkable. IMPRESSION: No acute osseous abnormality. Electronically Signed   By: Maudry Mayhew MD   On: 02/07/2021 16:50    Procedures Procedures (including critical care time)  Medications Ordered in UC Medications - No data to display  Initial Impression / Assessment and Plan / UC Course  I have reviewed the triage vital signs and the nursing notes.  Pertinent labs & imaging results that were available during my care of the patient were reviewed by me and considered in my medical decision making (see chart for details).   1.  Dental pain/infection/oral ulcer: Treating with Augmentin at this time.  Advised increased rest and fluids.  Discussed good oral care.  Tylenol for pain.  Advised to contact dentist ASAP as she is going to likely need this tooth removed.  Go to ED for any fever, worsening swelling or pain in the mouth.  2.  Left shoulder and right knee pain: Imaging is negative for any acute abnormality.  Reviewed results with patient.  Advised supportive care at this time with RICE and Tylenol for pain only.  Advised that she can use the shoulder sling as needed and she was also given an Ace wrap for the knee.  She is to follow-up with orthopedics if she is not improving over the next 7 to 10 days that she may need physical therapy.  3.  Abdominal pain: Review of lab work from ER 4 days ago shows no elevated white blood cell count.  Repeat CBC today is normal.  A urinalysis is normal.  Unable to get enough blood for a CMP.  She had slightly low potassium a few days ago.  Advised  her to follow-up with PCP regarding this.  It is possible she could be having another flareup of her diverticulitis.  I did review previous office notes which show that she did have diverticulitis in this region.  Augmentin should cover mild diverticulitis.  She should go to the ED if she develops any increased abdominal pain or fever, in which case she may require more antibiotics or observation.  Patient understanding and agreeable.   Final Clinical Impressions(s) / UC Diagnoses   Final diagnoses:  Dental infection  Oral ulcer  Acute pain of left shoulder  Acute pain of right knee  Abdominal pain, right upper quadrant     Discharge Instructions     DENTAL INFECTION: Follow up with dentist at the next available appointment.  In the meantime, we will cover for dental infection with Augmentin.  Take Tylenol for pain relief. Consider Orajel also. May ice the area. Follow up with dentist in the next few days. In some cases, the tooth may needed to be extracted. Follow up with [place] or ER sooner if the condition worsens before the dental appointment. If they develop a fever, significant soft tissue swelling, or worse pain, go to ER  ABDOMINAL PAIN: You may take Tylenol for pain relief. Use medications as directed including antiemetics and antidiarrheal medications if suggested or prescribed. You should increase fluids and electrolytes as well as rest over these next several days. If you have any questions or concerns, or if your symptoms are not improving or if especially if they acutely worsen, please call or stop back to the clinic immediately and we will be happy to help you or go to  the ER   ABDOMINAL PAIN RED FLAGS: Seek immediate further care if: symptoms remain the same or worsen over the next 3-7 days, you are unable to keep fluids down, you see blood or mucus in your stool, you vomit black or dark red material, you have a fever of 101.F or higher, you have localized and/or persistent abdominal  pain    MUSCULOSKELETAL PAIN: Your x-rays are normal.  You have been given a sling and an ACE wrap for your knee.  Ice these areas and take Tylenol for pain relief.  Follow-up with orthopedics if you are not improving over the next 7 to 10 days.    ED Prescriptions    None     I have reviewed the PDMP during this encounter.   Shirlee LatchEaves, Mikhail Hallenbeck B, PA-C 02/07/21 1712    Shirlee LatchEaves, Quadasia Newsham B, PA-C 02/07/21 1712

## 2021-02-22 ENCOUNTER — Other Ambulatory Visit: Payer: Self-pay

## 2021-02-24 ENCOUNTER — Other Ambulatory Visit: Payer: Medicare Other

## 2021-02-24 ENCOUNTER — Inpatient Hospital Stay: Payer: Medicare Other | Attending: Hematology and Oncology

## 2021-02-24 DIAGNOSIS — D509 Iron deficiency anemia, unspecified: Secondary | ICD-10-CM | POA: Insufficient documentation

## 2021-02-24 DIAGNOSIS — I2694 Multiple subsegmental pulmonary emboli without acute cor pulmonale: Secondary | ICD-10-CM | POA: Insufficient documentation

## 2021-02-24 DIAGNOSIS — Z7901 Long term (current) use of anticoagulants: Secondary | ICD-10-CM

## 2021-02-24 DIAGNOSIS — E538 Deficiency of other specified B group vitamins: Secondary | ICD-10-CM | POA: Diagnosis not present

## 2021-02-24 DIAGNOSIS — D500 Iron deficiency anemia secondary to blood loss (chronic): Secondary | ICD-10-CM

## 2021-02-24 LAB — CBC WITH DIFFERENTIAL/PLATELET
Abs Immature Granulocytes: 0.01 10*3/uL (ref 0.00–0.07)
Basophils Absolute: 0 10*3/uL (ref 0.0–0.1)
Basophils Relative: 1 %
Eosinophils Absolute: 0.1 10*3/uL (ref 0.0–0.5)
Eosinophils Relative: 1 %
HCT: 37.7 % (ref 36.0–46.0)
Hemoglobin: 12 g/dL (ref 12.0–15.0)
Immature Granulocytes: 0 %
Lymphocytes Relative: 43 %
Lymphs Abs: 1.8 10*3/uL (ref 0.7–4.0)
MCH: 26.5 pg (ref 26.0–34.0)
MCHC: 31.8 g/dL (ref 30.0–36.0)
MCV: 83.4 fL (ref 80.0–100.0)
Monocytes Absolute: 0.3 10*3/uL (ref 0.1–1.0)
Monocytes Relative: 7 %
Neutro Abs: 2 10*3/uL (ref 1.7–7.7)
Neutrophils Relative %: 48 %
Platelets: 246 10*3/uL (ref 150–400)
RBC: 4.52 MIL/uL (ref 3.87–5.11)
RDW: 14.5 % (ref 11.5–15.5)
WBC: 4.1 10*3/uL (ref 4.0–10.5)
nRBC: 0 % (ref 0.0–0.2)

## 2021-02-24 LAB — COMPREHENSIVE METABOLIC PANEL
ALT: 9 U/L (ref 0–44)
AST: 19 U/L (ref 15–41)
Albumin: 3.7 g/dL (ref 3.5–5.0)
Alkaline Phosphatase: 64 U/L (ref 38–126)
Anion gap: 9 (ref 5–15)
BUN: 7 mg/dL (ref 6–20)
CO2: 26 mmol/L (ref 22–32)
Calcium: 8.8 mg/dL — ABNORMAL LOW (ref 8.9–10.3)
Chloride: 103 mmol/L (ref 98–111)
Creatinine, Ser: 0.52 mg/dL (ref 0.44–1.00)
GFR, Estimated: >60 mL/min (ref >60)
Glucose, Bld: 144 mg/dL — ABNORMAL HIGH (ref 70–99)
Potassium: 3.4 mmol/L — ABNORMAL LOW (ref 3.5–5.1)
Sodium: 138 mmol/L (ref 135–145)
Total Bilirubin: 0.3 mg/dL (ref 0.3–1.2)
Total Protein: 6.9 g/dL (ref 6.5–8.1)

## 2021-02-24 LAB — FERRITIN: Ferritin: 22 ng/mL (ref 11–307)

## 2021-02-24 NOTE — Progress Notes (Signed)
[facility]  [address], [address] [place], [place] [address] Phone: [phone]  Fax: [phone]   Clinic day:  02/28/2021  Referring physician: Emogene MorganAycock, Ngwe A, MD  Chief Complaint: Laure KidneyLatiana R Treanor is a 35 y.o. female with pulmonary embolism, iron deficiency anemia secondary to menorrhagia,andB12 deficiency who is seen for 3 month assessment.   HPI: The patient was last seen in the hematology clinic on 09/01/2020 via telemedicine. At that time, she did not feel well. She reported shortness of breath, tingling hands, dull neck pain, mild chest pain, and dizziness. She denied any seizure activity.  She denied fevers, numbness, weakness, balance or coordination problems. She remained on Xarelto. Hematocrit was 40.5, hemoglobin 13.2, platelets 235,000, WBC 5,200. Ferritin was 50 with an iron saturation of 27% and a TIBC of 391. Vitamin B12 was 398.  We discussed ER evaluation.  Patient was [facility] from 10/27/2020 - 10/29/2020 with cecal diverticulitis. She presented with 4 days of progressively worsening RLQ pain. She was started on ceftriaxone and metronidazole. She developed leukocytosis the next morning (WBC 13,400) and was transitioned to ertapenem. Her diet was slowly advanced and she tolerated a regular diet on the day of discharge. She was transitioned from ertapenem to PO ciprofloxacin and metronidazole for a completion of a 5 day course.  Sleep study on 12/15/2020 revealed significant, REM-related obstructive sleep apnea.  [facility] for a dental infection. She was treated with Augmentin and was instructed to see her dentist. She is seeing her dentist this week.  During the interim, she has been "ok." Her energy is low. Her breathing is not great due to asthma. She has chest tightness that worsens with exertion. She has occasional dizziness. Her finger tingling comes and goes. Her neck pain has resolved. She  has fibromyalgia and back pain.  She had a fall and hurt her right knee. She has an MRI coming up.  She is off of antibiotics. She is still taking Xarelto. She uses a CPAP machine for sleep apnea.  She would like IV iron today. She is interested in doing vitamin B12 injections at home.   Past Medical History:  Diagnosis Date  . Anxiety   . Asthma   . Bipolar disorder (HCC)   . Chest pain    and angina  . CHF (congestive heart failure) (HCC)   . Depression   . Fibromyalgia   . Hypertension   . 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 Obesity   . Pre-diabetes   . Pulmonary embolus (HCC)   . Seizures (HCC)    secondary to anxiety  last seizure 01/25/18  . SVT (supraventricular tachycardia) (HCC)    with hx of syncope; [facility]    Past Surgical History:  Procedure Laterality Date  . CHOLECYSTECTOMY N/A 02/22/2018   Procedure: LAPAROSCOPIC CHOLECYSTECTOMY;  Surgeon: Ancil Linseyavis, Jason Evan, MD;  Location: [facility] ORS;  Service: General;  Laterality: N/A;  . TONSILLECTOMY AND ADENOIDECTOMY N/A 09/05/2016   Procedure: TONSILLECTOMY AND ADENOIDECTOMY;  Surgeon: Linus Salmonshapman McQueen, MD;  Location: [facility] ORS;  Service: ENT;  Laterality: N/A;    Family History  Problem Relation Age of Onset  . Diabetes Mother   . Hypertension Mother   . Asthma Mother   . Gallstones Mother   . Diabetes Father   . Hypertension Father   . Gallstones Father   . Bipolar disorder Sister   .  COPD Brother   . Schizophrenia Brother   . COPD Brother   . Hypertension Brother     Social History:  reports that she has never smoked. She has never used smokeless tobacco. She reports that she does not drink alcohol and does not use drugs.  No tobacco, or illegal subatance use. Prior to her back issues, patient was employed full time at [employer]. The patient is alone today.  Allergies:  Allergies  Allergen Reactions  . Cortizone-10 [Hydrocortisone] Shortness Of Breath and  Swelling  . Garlic Anaphylaxis  . Prednisone Swelling    Tongue swelling  . Contrast Media [Iodinated Diagnostic Agents]     Pt reports that "every time" she receives contrast she "can't breathe" for a short time. At time of arrival to room after CT scan, she is breathing normally and VS WNL.  Pt states that she thought not breathing after contrast for a little while was normal. When asked if allergic or has any reaction to IV contrast patient stated no. LP   . Corticosteroids Palpitations  . Penicillins Other (See Comments) and Rash    Has patient had a PCN reaction causing immediate rash, facial/tongue/throat swelling, SOB or lightheadedness with hypotension:Yes Has patient had a PCN reaction causing severe rash involving mucus membranes or skin necrosis:No Has patient had a PCN reaction that required hospitalization:No Has patient had a PCN reaction occurring within the last 10 years:Yes. If all of the above answers are "NO", then may proceed with Cephalosporin use.  Has patient had a PCN reaction causing immediate rash, facial/tongue/throat swelling, SOB or lightheadedness with hypotension:Yes Has patient had a PCN reaction causing severe rash involving mucus membranes or skin necrosis:No Has patient had a PCN reaction that required hospitalization:No Has patient had a PCN reaction occurring within the last 10 years:Yes. If all of the above answers are "NO", then may proceed with Cephalosporin use.   Patient does not remember Patient does not remember    Current Medications: Current Outpatient Medications  Medication Sig Dispense Refill  . Adapalene 0.3 % gel Apply 1 application topically at bedtime.    Marland Kitchen amphetamine-dextroamphetamine (ADDERALL XR) 15 MG 24 hr capsule TAKE 1 CAPSULE BY MOUTH EACH MORNING    . budesonide-formoterol (SYMBICORT) 160-4.5 MCG/ACT inhaler Inhale 2 puffs into the lungs 2 (two) times daily. 10.2 g 3  . buprenorphine (BUTRANS) 15 MCG/HR Place onto the  skin.    . Calcium-Magnesium 500-250 MG TABS Take 1 tablet by mouth 2 (two) times daily.     . cyanocobalamin (,VITAMIN B-12,) 1000 MCG/ML injection Inject 1,000 mcg into the muscle every 30 (thirty) days.    Marland Kitchen Dexlansoprazole 30 MG capsule Take 30 mg by mouth daily.    . diclofenac Sodium (VOLTAREN) 1 % GEL Apply 2 g topically 4 (four) times daily. 100 g 0  . diltiazem (CARDIZEM CD) 360 MG 24 hr capsule Take 360 mg by mouth every morning.    Marland Kitchen ELIDEL 1 % cream Apply to aa's eczema QD-BID PRN    . escitalopram (LEXAPRO) 20 MG tablet Take 20 mg by mouth daily.    . fluticasone (FLONASE) 50 MCG/ACT nasal spray Place 2 sprays into both nostrils daily as needed for allergies. 16 g 1  . furosemide (LASIX) 20 MG tablet Take 40 mg by mouth 2 (two) times daily.     Marland Kitchen gabapentin (NEURONTIN) 400 MG capsule Take 400 mg by mouth 3 (three) times daily.    . hydroxypropyl methylcellulose / hypromellose (ISOPTO  TEARS / GONIOVISC) 2.5 % ophthalmic solution Place 1 drop into both eyes 3 (three) times daily as needed for dry eyes.    . hydrOXYzine (ATARAX/VISTARIL) 10 MG tablet Take 10 mg by mouth 2 (two) times daily.    . Iron-Vitamin C 65-125 MG TABS Take 1 tablet by mouth 2 (two) times daily. 60 tablet 1  . isosorbide mononitrate (IMDUR) 30 MG 24 hr tablet Take 30 mg by mouth daily.    Marland Kitchen ketoconazole (NIZORAL) 2 % cream Apply to affected areas rash on face and under breasts 1-2 times a day as needed. 60 g 3  . ketoconazole (NIZORAL) 2 % shampoo Apply to scalp when shampooing and let sit several minutes before rinsing. 120 mL 5  . lactulose (CHRONULAC) 10 GM/15ML solution Take 30 g by mouth daily as needed for mild constipation.  5  . lamoTRIgine (LAMICTAL) 200 MG tablet Take 1 tablet by mouth 2 (two) times a day.    Marland Kitchen LATUDA 20 MG TABS tablet TAKE 1 TABLET BY MOUTH EACH EVENING WITH FOOD.    Marland Kitchen linaclotide (LINZESS) 290 MCG CAPS capsule Take 290 mcg by mouth daily before breakfast.    . mometasone (ELOCON) 0.1  % cream Apply to more severe areas eczema 1-2 times as directed. Avoid face, groin, underarms. 45 g 2  . montelukast (SINGULAIR) 10 MG tablet Take 1 tablet (10 mg total) by mouth at bedtime. 90 tablet 1  . ondansetron (ZOFRAN ODT) 8 MG disintegrating tablet Take 1 tablet (8 mg total) by mouth every 8 (eight) hours as needed for nausea or vomiting. 20 tablet 0  . Potassium Chloride ER 20 MEQ TBCR Take 20 mEq by mouth 2 (two) times daily.     Marland Kitchen PROAIR RESPICLICK 108 (90 Base) MCG/ACT AEPB INHALE 2 PUFFS INTO THE LUNGS EVERY 6 HOURS AS NEEDED. 1 each 1  . propranolol (INDERAL) 40 MG tablet Take 2 tablets (80 mg total) by mouth 3 (three) times daily. 90 tablet 0  . rivaroxaban (XARELTO) 20 MG TABS tablet Take 1 tablet (20 mg total) by mouth every morning. 30 tablet 2  . rosuvastatin (CRESTOR) 20 MG tablet Take 20 mg by mouth daily.    Marland Kitchen SPIRIVA RESPIMAT 1.25 MCG/ACT AERS INHALE 2 PUFFS INTO THE LUNGS DAILY. 4 g 5  . Topiramate ER (TROKENDI XR) 200 MG CP24 Take 1 capsule by mouth daily.     Marland Kitchen tretinoin (RETIN-A) 0.05 % cream Apply a pea-sized amount to face every night as tolerated for acne. 45 g 3  . vitamin C (VITAMIN C) 250 MG tablet Take 1 tablet (250 mg total) by mouth 2 (two) times daily. 60 tablet 0  . albuterol (PROVENTIL) (2.5 MG/3ML) 0.083% nebulizer solution Take 3 mLs (2.5 mg total) by nebulization every 4 (four) hours as needed for wheezing or shortness of breath. 360 mL 0   No current facility-administered medications for this visit.    Review of Systems  Constitutional: Positive for malaise/fatigue (low energy). Negative for chills, diaphoresis, fever and weight loss (up 34 lbs since 02/07/2021).       Feels "okay."  HENT: Negative for congestion, ear discharge, ear pain, hearing loss, nosebleeds, sinus pain, sore throat and tinnitus.   Eyes: Negative for blurred vision.  Respiratory: Positive for shortness of breath. Negative for cough, hemoptysis and sputum production.        Sleep  apnea. Uses CPAP.  Cardiovascular: Negative for chest pain (tightness, worse with exertion), palpitations and leg swelling.  Gastrointestinal: Negative for abdominal pain, blood in stool, constipation, diarrhea, heartburn, melena, nausea and vomiting.  Genitourinary: Negative for dysuria, frequency, hematuria and urgency.  Musculoskeletal: Positive for back pain, falls, joint pain (right knee s/p fall) and myalgias (fibromyalgia). Negative for neck pain.  Skin: Negative for itching and rash.  Neurological: Positive for dizziness and tingling (hands, comes and goes). Negative for sensory change, weakness and headaches.  Endo/Heme/Allergies: Does not bruise/bleed easily.  Psychiatric/Behavioral: Negative for depression and memory loss. The patient is not nervous/anxious and does not have insomnia.   All other systems reviewed and are negative.  Performance status (ECOG): 1  Vital Signs: Blood pressure 137/78, pulse (!) 54, temperature (!) 97.2 F (36.2 C), temperature source Tympanic, resp. rate 20, weight (!) 374 lb 9 oz (169.9 kg), last menstrual period 02/03/2021, SpO2 97 %.  Physical Exam Vitals and nursing note reviewed.  Constitutional:      General: She is not in acute distress.    Appearance: She is not diaphoretic.  HENT:     Head: Normocephalic and atraumatic.     Mouth/Throat:     Mouth: Mucous membranes are moist.     Pharynx: Oropharynx is clear.  Eyes:     General: No scleral icterus.    Extraocular Movements: Extraocular movements intact.     Conjunctiva/sclera: Conjunctivae normal.     Pupils: Pupils are equal, round, and reactive to light.     Comments: Brown eyes.  Cardiovascular:     Rate and Rhythm: Normal rate and regular rhythm.     Heart sounds: Normal heart sounds. No murmur heard.   Pulmonary:     Effort: Pulmonary effort is normal. No respiratory distress.     Breath sounds: Normal breath sounds. No wheezing or rales.  Chest:     Chest wall: No  tenderness.  Breasts:     Right: No axillary adenopathy or supraclavicular adenopathy.     Left: No axillary adenopathy or supraclavicular adenopathy.    Abdominal:     General: Bowel sounds are normal. There is no distension.     Palpations: Abdomen is soft. There is no mass.     Tenderness: There is no abdominal tenderness. There is no guarding or rebound.  Musculoskeletal:        General: Tenderness (BLE, lower lumbar/sacral spine) present. No swelling. Normal range of motion.     Cervical back: Normal range of motion and neck supple.     Comments: Shoulder soreness due to fibromyalgia  Lymphadenopathy:     Head:     Right side of head: No preauricular, posterior auricular or occipital adenopathy.     Left side of head: No preauricular, posterior auricular or occipital adenopathy.     Cervical: No cervical adenopathy.     Upper Body:     Right upper body: No supraclavicular or axillary adenopathy.     Left upper body: No supraclavicular or axillary adenopathy.     Lower Body: No right inguinal adenopathy. No left inguinal adenopathy.  Skin:    General: Skin is warm and dry.  Neurological:     Mental Status: She is alert and oriented to person, place, and time.  Psychiatric:        Behavior: Behavior normal.        Thought Content: Thought content normal.        Judgment: Judgment normal.    No visits with results within 3 Day(s) from this visit.  Latest known visit with results  is:  Appointment on 02/24/2021  Component Date Value Ref Range Status  . Ferritin 02/24/2021 22  11 - 307 ng/mL Final   Performed at [facility], [address] [place], [place] [address]  . Sodium 02/24/2021 138  135 - 145 mmol/L Final  . Potassium 02/24/2021 3.4* 3.5 - 5.1 mmol/L Final  . Chloride 02/24/2021 103  98 - 111 mmol/L Final  . CO2 02/24/2021 26  22 - 32 mmol/L Final  . Glucose, Bld 02/24/2021 144* 70 - 99 mg/dL Final   Glucose reference range applies only to samples taken  after fasting for at least 8 hours.  . BUN 02/24/2021 7  6 - 20 mg/dL Final  . Creatinine, Ser 02/24/2021 0.52  0.44 - 1.00 mg/dL Final  . Calcium 33/82/5053 8.8* 8.9 - 10.3 mg/dL Final  . Total Protein 02/24/2021 6.9  6.5 - 8.1 g/dL Final  . Albumin 97/67/3419 3.7  3.5 - 5.0 g/dL Final  . AST 37/90/2409 19  15 - 41 U/L Final  . ALT 02/24/2021 9  0 - 44 U/L Final  . Alkaline Phosphatase 02/24/2021 64  38 - 126 U/L Final  . Total Bilirubin 02/24/2021 0.3  0.3 - 1.2 mg/dL Final  . GFR, Estimated 02/24/2021 >60  >60 mL/min Final   Comment: (NOTE) Calculated using the CKD-EPI Creatinine Equation (2021)   . Anion gap 02/24/2021 9  5 - 15 Final   Performed at [facility], [address] [place], [place] [address]  . WBC 02/24/2021 4.1  4.0 - 10.5 K/uL Final  . RBC 02/24/2021 4.52  3.87 - 5.11 MIL/uL Final  . Hemoglobin 02/24/2021 12.0  12.0 - 15.0 g/dL Final  . HCT 99/24/2683 37.7  36.0 - 46.0 % Final  . MCV 02/24/2021 83.4  80.0 - 100.0 fL Final  . MCH 02/24/2021 26.5  26.0 - 34.0 pg Final  . MCHC 02/24/2021 31.8  30.0 - 36.0 g/dL Final  . RDW 41/96/2229 14.5  11.5 - 15.5 % Final  . Platelets 02/24/2021 246  150 - 400 K/uL Final  . nRBC 02/24/2021 0.0  0.0 - 0.2 % Final  . Neutrophils Relative % 02/24/2021 48  % Final  . Neutro Abs 02/24/2021 2.0  1.7 - 7.7 K/uL Final  . Lymphocytes Relative 02/24/2021 43  % Final  . Lymphs Abs 02/24/2021 1.8  0.7 - 4.0 K/uL Final  . Monocytes Relative 02/24/2021 7  % Final  . Monocytes Absolute 02/24/2021 0.3  0.1 - 1.0 K/uL Final  . Eosinophils Relative 02/24/2021 1  % Final  . Eosinophils Absolute 02/24/2021 0.1  0.0 - 0.5 K/uL Final  . Basophils Relative 02/24/2021 1  % Final  . Basophils Absolute 02/24/2021 0.0  0.0 - 0.1 K/uL Final  . Immature Granulocytes 02/24/2021 0  % Final  . Abs Immature Granulocytes 02/24/2021 0.01  0.00 - 0.07 K/uL Final   Performed at [facility], [address] [place], [place] [address]     Assessment:  MEKALA WINGER is a 35 y.o. female with small bilateral pulmonary emboli. Risk factors for thrombosis include obesity and birth control pills.  Chest CT angiogramon 09/15/2018 revealed small bilateral pulmonary emboli and mild cardiomegaly. Bilateral lower extremity duplexon 09/16/2018 revealed no evidence of lower extremity DVT.   Hypercoagulable work-upon 10/11/2018 revealed the following negative studies: factor V Leiden, prothrombin gene mutation, anti-cardiolipin antibodies, and beta-2 glycoprotein antibodies. Lupus anticoagulant testing was positive (on Xarelto).Protein C, protein S, and ATIII were normal  on 01/15/2019. Lupus anticoagulant testing was negative on 04/24/2019.  She has iron deficiency anemia.She notes a recent history of extremely heavy mensesx 3 weeks (none in past 2 weeks). Hemoglobin was 10.5 on 10/29/2019and 6.5 on 10/10/2018. Work-up on 10/10/2018 revealed a ferritinof 4, iron saturation 3%. She hasice pica.She received 3 units of PRBCs. She is on oral iron.  She received Venofer on 10/07/2019, 10/20/2019, and 03/19/2020.  Ferritinhas been followed: 4 on 10/11/2018,7 on 01/15/2019, 11 on 04/24/2019, 10 on 09/18/2019, 31 on 11/26/2019, 23 on 03/17/2020, and 50 on 08/31/2020.  She has B12 deficiency. B12 was 242 on 10/10/2018. She began B12 injectionson 10/13/2018 (last07/14/2021). Folate was 20.1 on 11/26/2019.  Diet has been protein shakes and Weight Watcher's.  She had her COVID-19 vaccine in 02/2020. She only received one dose due to the side effects.  Symptomatically, her energy is low. Her breathing is not great due to asthma. She has chest tightness that worsens with exertion. She has occasional dizziness. Her finger tingling comes and goes. Her neck pain has resolved. She has fibromyalgia and back pain.  Exam is stable.  Plan: 1.   Review labs from 02/24/2021. 2.   Iron deficiency anemia Hematocrit 37.7.   Hemoglobin 12.0. MCV 83.4 on 02/24/2021.  Ferritin 22. Ferritin goal is 100.    Patient receives Venofer if ferritin < 30. Venofer today and weekly x 1 (total 2). 3. B12 deficiency Continue B12 monthly.  Patient wishes to begin monthly B12 injections at home.  Check folate annually. 4. Menorrhagia Patient has heavy menses. Follow-up with [facility] [phone]). 5. Pulmonary embolism Patientremains Xarelto. Patient denies any bleeding. Etiology of thrombosis feltsecondary to weight and birth control pills Repeat lupus anticoagulant testing was negative. 6.   Venofer today and weekly x 1 (total 2). 7.   B12 today. 8.   RN: Please provide teaching for B12 injections and provide Rx. 9.   RTC in 3 months for labs (CBC with diff, ferritin, folate-day before) and +/- Venofer. 10.   RTC in 6 months for MD assessment, labs (CBC with diff, CMP, ferritin-day before) and +/- Venofer.  I discussed the assessment and treatment plan with the patient.  The patient was provided an opportunity to ask questions and all were answered.  The patient agreed with the plan and demonstrated an understanding of the instructions.  The patient was advised to call back if the symptoms worsen or if the condition fails to improve as anticipated.  I provided 17 minutes of face-to-face time during this this encounter and > 50% was spent counseling as documented under my assessment and plan.  An additional 10 minutes were spent reviewing her chart (Epic and Care Everywhere) including notes, labs, and imaging studies.    Rosey Bath, MD, PhD    02/28/2021, 2:19 PM  I, Danella Penton Tufford, am acting as Neurosurgeon for [employer]. Merlene Pulling, MD, PhD.  I, Brigitte Soderberg C. Merlene Pulling, MD, have reviewed the above documentation for accuracy and completeness, and I agree with the above.

## 2021-02-25 ENCOUNTER — Telehealth: Payer: Self-pay | Admitting: *Deleted

## 2021-02-25 NOTE — Telephone Encounter (Signed)
Patient contacted. Patient made aware that her potassium level is slightly low. She is on a potassium supplement. Pt instructed to increase her diet in potassium this week. She was instructed to keep her apts as scheduled on Monday 02/28/21.

## 2021-02-25 NOTE — Telephone Encounter (Signed)
-----   Message from Rosey Bath, MD sent at 02/24/2021  2:45 PM EDT ----- Regarding: Please call patient  Potassium is a little low.  She is on a diuretic.  M   ----- Message ----- From: Leory Plowman, Lab In [place] Sent: 02/24/2021  12:17 PM EDT To: Rosey Bath, MD

## 2021-02-28 ENCOUNTER — Inpatient Hospital Stay: Payer: Medicare Other

## 2021-02-28 ENCOUNTER — Inpatient Hospital Stay: Payer: Medicare Other | Attending: Hematology and Oncology | Admitting: Hematology and Oncology

## 2021-02-28 ENCOUNTER — Encounter: Payer: Self-pay | Admitting: Hematology and Oncology

## 2021-02-28 ENCOUNTER — Other Ambulatory Visit: Payer: Self-pay

## 2021-02-28 VITALS — BP 137/78 | HR 54 | Temp 97.2°F | Resp 20 | Wt 374.6 lb

## 2021-02-28 DIAGNOSIS — E538 Deficiency of other specified B group vitamins: Secondary | ICD-10-CM | POA: Insufficient documentation

## 2021-02-28 DIAGNOSIS — I2694 Multiple subsegmental pulmonary emboli without acute cor pulmonale: Secondary | ICD-10-CM | POA: Diagnosis not present

## 2021-02-28 DIAGNOSIS — D509 Iron deficiency anemia, unspecified: Secondary | ICD-10-CM | POA: Diagnosis present

## 2021-02-28 DIAGNOSIS — Z7901 Long term (current) use of anticoagulants: Secondary | ICD-10-CM | POA: Diagnosis not present

## 2021-02-28 DIAGNOSIS — D500 Iron deficiency anemia secondary to blood loss (chronic): Secondary | ICD-10-CM | POA: Diagnosis not present

## 2021-02-28 LAB — PREGNANCY, URINE: Preg Test, Ur: NEGATIVE

## 2021-02-28 MED ORDER — "BD SYRINGE/NEEDLE SLIP TIP 25G X 5/8"" 1 ML MISC": 1.0000 mL | 3 refills | Status: DC

## 2021-02-28 MED ORDER — CYANOCOBALAMIN 1000 MCG/ML IJ SOLN: 1000.0000 ug | INTRAMUSCULAR | 12 refills | Status: DC

## 2021-02-28 MED ORDER — CYANOCOBALAMIN 1000 MCG/ML IJ SOLN
1000.0000 ug | Freq: Once | INTRAMUSCULAR | Status: AC
Start: 2021-02-28 — End: 2021-02-28
  Administered 2021-02-28: 1000 ug via INTRAMUSCULAR
  Filled 2021-02-28: qty 1

## 2021-02-28 MED ORDER — RIVAROXABAN 20 MG PO TABS: 20.0000 mg | ORAL_TABLET | Freq: Every morning | ORAL | 2 refills | Status: DC

## 2021-02-28 NOTE — Progress Notes (Signed)
Patient tolerated Vitamin B injection today, no concerns voiced. Patient taught and demonstrated how to given injection to self. Patient was able to teach back RN. Patient aware suppiles will be sent to pharmacy. All questions answered.  Patient discharged, stable.

## 2021-03-01 ENCOUNTER — Inpatient Hospital Stay: Payer: Medicare Other | Attending: Hematology and Oncology

## 2021-03-01 NOTE — Progress Notes (Signed)
Unable to obtain IV access. Patient stuck 4 times by two RNs. Patient to reschedule appointment. MD made aware

## 2021-03-03 ENCOUNTER — Inpatient Hospital Stay: Payer: Medicare Other

## 2021-03-07 ENCOUNTER — Ambulatory Visit: Payer: Self-pay | Admitting: Urology

## 2021-03-07 ENCOUNTER — Inpatient Hospital Stay: Payer: Medicare Other

## 2021-03-14 ENCOUNTER — Encounter: Payer: Self-pay | Admitting: Urology

## 2021-03-14 ENCOUNTER — Ambulatory Visit: Payer: Self-pay | Admitting: Urology

## 2021-03-15 ENCOUNTER — Other Ambulatory Visit: Payer: Self-pay

## 2021-03-15 ENCOUNTER — Ambulatory Visit (INDEPENDENT_AMBULATORY_CARE_PROVIDER_SITE_OTHER): Payer: Medicare Other | Admitting: Dermatology

## 2021-03-15 DIAGNOSIS — L219 Seborrheic dermatitis, unspecified: Secondary | ICD-10-CM

## 2021-03-15 DIAGNOSIS — L700 Acne vulgaris: Secondary | ICD-10-CM | POA: Diagnosis not present

## 2021-03-15 DIAGNOSIS — L209 Atopic dermatitis, unspecified: Secondary | ICD-10-CM | POA: Diagnosis not present

## 2021-03-15 DIAGNOSIS — L304 Erythema intertrigo: Secondary | ICD-10-CM

## 2021-03-15 MED ORDER — KETOCONAZOLE 2 % EX CREA: TOPICAL_CREAM | CUTANEOUS | 11 refills | Status: DC

## 2021-03-15 MED ORDER — ELIDEL 1 % EX CREA: TOPICAL_CREAM | CUTANEOUS | 11 refills | Status: DC

## 2021-03-15 MED ORDER — MOMETASONE FUROATE 0.1 % EX CREA: TOPICAL_CREAM | CUTANEOUS | 3 refills | Status: DC

## 2021-03-15 MED ORDER — KETOCONAZOLE 2 % EX SHAM: MEDICATED_SHAMPOO | CUTANEOUS | 11 refills | Status: DC

## 2021-03-15 MED ORDER — HYDROCORTISONE 2.5 % EX CREA: TOPICAL_CREAM | CUTANEOUS | 2 refills | Status: DC

## 2021-03-15 MED ORDER — TRETINOIN 0.05 % EX CREA: TOPICAL_CREAM | CUTANEOUS | 11 refills | Status: DC

## 2021-03-15 NOTE — Progress Notes (Signed)
Follow-Up Visit   Subjective  Andrea Miller is a 35 y.o. female who presents for the following: Follow-up (Patient here for 2 month follow-up.). Atopic Dermatitis of the face, trunk, extremities. Currently using Elidel Cream and mometasone cream. Seborrheic dermatitis of the scalp and face, using ketoconazole shampoo and cream, Elidel cream. Intertrigo of the inframammary. Currently using ketoconazole cream and Elidel cream. Acne of the face, using tretinoin 0.05% cream. Patient is mostly controlled, but has a recent eczema flare on the face.  She has been applying alcohol to itchy area on cheeks.   The following portions of the chart were reviewed this encounter and updated as appropriate:       Review of Systems:  No other skin or systemic complaints except as noted in HPI or Assessment and Plan.  Objective  Well appearing patient in no apparent distress; mood and affect are within normal limits.  A focused examination was performed including face, trunk, extremities. Relevant physical exam findings are noted in the Assessment and Plan.  Objective  face: Few open and closed comedones mid face  Objective  face, trunk: Hyperpigmented patches  with mild xerosis BL cheeks  Objective  Right Inframammary: Improved per patient, not examined today.  Objective  Scalp: Mild scaling scalp, face clear   Assessment & Plan  Acne vulgaris face  Controlled.   Continue tretinoin 0.05% cream qhs face as tolerated.  Topical retinoid medications like tretinoin/Retin-A, adapalene/Differin, tazarotene/Fabior, and Epiduo/Epiduo Forte can cause dryness and irritation when first started. Only apply a pea-sized amount to the entire affected area. Avoid applying it around the eyes, edges of mouth and creases at the nose. If you experience irritation, use a good moisturizer first and/or apply the medicine less often. If you are doing well with the medicine, you can increase how often you use  it until you are applying every night. Be careful with sun protection while using this medication as it can make you sensitive to the sun. This medicine should not be used by pregnant women.    Reordered Medications tretinoin (RETIN-A) 0.05 % cream  Atopic dermatitis, unspecified type face, trunk  Overall controlled, recent flare on face  Start Hydrocortisone 2.5% Cream Apply to AA face qd/bid prn D/C use of alcohol on face Continue Aveeno eczema cream  Continue Elidel Cream Apply to AAs face/body qd/bid prn.  Continue mometasone cream Apply qd/bid more severe areas body dsp 45g 3Rf. Avoid face, groin, axilla.   Topical steroids (such as triamcinolone, fluocinolone, fluocinonide, mometasone, clobetasol, halobetasol, betamethasone, hydrocortisone) can cause thinning and lightening of the skin if they are used for too long in the same area. Your physician has selected the right strength medicine for your problem and area affected on the body. Please use your medication only as directed by your physician to prevent side effects.   Atopic dermatitis (eczema) is a chronic, relapsing, pruritic condition that can significantly affect quality of life. It is often associated with allergic rhinitis and/or asthma and can require treatment with topical medications, phototherapy, or in severe cases a biologic medication called Dupixent in older children and adults.     hydrocortisone 2.5 % cream - face, trunk  Reordered Medications mometasone (ELOCON) 0.1 % cream  Erythema intertrigo Right Inframammary  Improved Continue ketoconazole 2% cream qd/bid prn flares.  Intertrigo is a chronic recurrent rash that occurs in skin fold areas that may be associated with friction; heat; moisture; yeast; fungus; and bacteria.  It is exacerbated by increased movement /  activity; sweating; and higher atmospheric temperature.   Reordered Medications ketoconazole (NIZORAL) 2 % cream  Seborrheic  dermatitis Scalp  Controlled.  Continue Ketoconazole 2% shampoo Apply to scalp and let sit several minutes before rinsing 1 yr rf.  Continue Ketoconazole 2% cream Apply qd/bid to AAs face 60g 1 yr rf.  Continue Elidel Cream Apply to Aas qd/bid prn.  Seborrheic Dermatitis  -  is a chronic persistent rash characterized by pinkness and scaling most commonly of the mid face but also can occur on the scalp (dandruff), ears; mid chest and mid back. It tends to be exacerbated by stress and cooler weather.  People who have neurologic disease may experience new onset or exacerbation of existing seborrheic dermatitis.  The condition is not curable but treatable and can be controlled.     Return in about 6 months (around 09/14/2021) for AD, Seb Derm, Acne, Intertrigo.   ICherlyn Labella, CMA, am acting as scribe for Willeen Niece, MD .  Documentation: I have reviewed the above documentation for accuracy and completeness, and I agree with the above.  Willeen Niece MD

## 2021-03-15 NOTE — Patient Instructions (Signed)

## 2021-03-28 NOTE — Progress Notes (Signed)
No show for appointment. Office will call to reschedule.  

## 2021-03-30 ENCOUNTER — Ambulatory Visit: Payer: Medicare Other | Admitting: Internal Medicine

## 2021-04-07 ENCOUNTER — Other Ambulatory Visit: Payer: Self-pay | Admitting: Dermatology

## 2021-04-07 DIAGNOSIS — L304 Erythema intertrigo: Secondary | ICD-10-CM

## 2021-05-12 ENCOUNTER — Other Ambulatory Visit: Payer: Self-pay

## 2021-05-12 ENCOUNTER — Other Ambulatory Visit: Payer: Self-pay | Admitting: Primary Care

## 2021-05-12 NOTE — Progress Notes (Signed)
Erroneous encounter

## 2021-05-13 ENCOUNTER — Other Ambulatory Visit: Payer: Self-pay | Admitting: Primary Care

## 2021-05-13 NOTE — Progress Notes (Unsigned)
No show for appointment. Office will call to reschedule.  

## 2021-05-17 ENCOUNTER — Ambulatory Visit: Payer: Medicare Other

## 2021-05-31 ENCOUNTER — Other Ambulatory Visit: Payer: Self-pay

## 2021-05-31 ENCOUNTER — Emergency Department
Admission: EM | Admit: 2021-05-31 | Discharge: 2021-05-31 | Disposition: A | Discharge status: Home / Self Care | Payer: Medicare Other | Attending: Emergency Medicine | Admitting: Emergency Medicine

## 2021-05-31 DIAGNOSIS — Z7901 Long term (current) use of anticoagulants: Secondary | ICD-10-CM | POA: Diagnosis not present

## 2021-05-31 DIAGNOSIS — I5032 Chronic diastolic (congestive) heart failure: Secondary | ICD-10-CM | POA: Insufficient documentation

## 2021-05-31 DIAGNOSIS — M25561 Pain in right knee: Secondary | ICD-10-CM | POA: Diagnosis present

## 2021-05-31 DIAGNOSIS — I2510 Atherosclerotic heart disease of native coronary artery without angina pectoris: Secondary | ICD-10-CM | POA: Insufficient documentation

## 2021-05-31 DIAGNOSIS — I110 Hypertensive heart disease with heart failure: Secondary | ICD-10-CM | POA: Diagnosis not present

## 2021-05-31 DIAGNOSIS — J45901 Unspecified asthma with (acute) exacerbation: Secondary | ICD-10-CM | POA: Insufficient documentation

## 2021-05-31 DIAGNOSIS — E119 Type 2 diabetes mellitus without complications: Secondary | ICD-10-CM | POA: Insufficient documentation

## 2021-05-31 DIAGNOSIS — Z79899 Other long term (current) drug therapy: Secondary | ICD-10-CM | POA: Diagnosis not present

## 2021-05-31 MED ORDER — OXYCODONE HCL 5 MG PO TABS: 5.0000 mg | ORAL_TABLET | Freq: Three times a day (TID) | ORAL | 0 refills | Status: DC | PRN

## 2021-05-31 MED ORDER — OXYCODONE-ACETAMINOPHEN 5-325 MG PO TABS
1.0000 | ORAL_TABLET | Freq: Once | ORAL | Status: AC
  Administered 2021-05-31: 1 via ORAL
  Filled 2021-05-31: qty 1

## 2021-05-31 NOTE — ED Provider Notes (Signed)
[facility] EMERGENCY DEPARTMENT Provider Note   CSN: [id number] Arrival date & time: 05/31/21  1448     History Chief Complaint  Patient presents with   Knee Pain    CHERLYNN POPIEL is a 35 y.o. female presents to the emergency department evaluation of right knee pain.  Back in March she injured her knee, had x-rays that were negative for acute bony abnormality.  She later underwent MRI recently at [facility] where she was told she had a lateral meniscus tear.  She complains of lateral right knee pain along the joint line with catching locking swelling and tightness.  She denies any new trauma or injury.  She states she is unable to be seen until next month.  She has been doing a lot of walking recently.  She denies any calf pain.  Pain is located throughout the knee and along the lateral joint line.  She is not using any assistive devices.  HPI     Past Medical History:  Diagnosis Date   Anxiety    Asthma    Bipolar disorder (HCC)    Chest pain    and angina   CHF (congestive heart failure) (HCC)    Depression    Fibromyalgia    Hypertension    Left lumbar radiculopathy since 08/2014   secondary to work accident   Obesity    Pre-diabetes    Pulmonary embolus (HCC)    Seizures (HCC)    secondary to anxiety  last seizure 01/25/18   SVT (supraventricular tachycardia) (HCC)    with hx of syncope; managed by [facility]    Patient Active Problem List   Diagnosis Date Noted   Chronic anticoagulation 05/07/2019   Iron deficiency anemia due to chronic blood loss 10/15/2018   B12 deficiency 10/13/2018   Symptomatic anemia 10/11/2018   Multiple subsegmental pulmonary emboli without acute cor pulmonale (HCC) 09/15/2018   Morbid obesity with BMI of 50.0-59.9, adult (HCC) 09/15/2018   Chronic diastolic heart failure (HCC) 07/01/2018   Lymphedema 07/01/2018   Chronic cholecystitis    Edema 02/13/2018   Diabetes mellitus type 2, uncomplicated (HCC)  02/13/2018   Hypertension 11/23/2017   Asthma exacerbation 04/14/2017   Asthma without status asthmaticus 01/16/2017   Bipolar affective (HCC) 01/16/2017   Coronary artery disease 01/16/2017   Syncope 12/12/2016   Fibromyalgia 04/27/2014   OSA (obstructive sleep apnea) 04/27/2014   Seizure-like activity (HCC) 04/27/2014   Migraine without status migrainosus, not intractable 04/27/2014   Obesity 04/15/2014   Sleep disorder 04/15/2014   Neck pain 04/15/2014   Headache 04/15/2014   Restless legs syndrome 04/15/2014   Supraventricular tachycardia (HCC) 07/13/2009    Past Surgical History:  Procedure Laterality Date   CHOLECYSTECTOMY N/A 02/22/2018   Procedure: LAPAROSCOPIC CHOLECYSTECTOMY;  Surgeon: Ancil Linsey, MD;  Location: [facility] ORS;  Service: General;  Laterality: N/A;   TONSILLECTOMY AND ADENOIDECTOMY N/A 09/05/2016   Procedure: TONSILLECTOMY AND ADENOIDECTOMY;  Surgeon: Linus Salmons, MD;  Location: [facility] ORS;  Service: ENT;  Laterality: N/A;     OB History     Gravida  0   Para  0   Term  0   Preterm  0   AB  0   Living  0      SAB  0   IAB  0   Ectopic  0   Multiple  0   Live Births  Family History  Problem Relation Age of Onset   Diabetes Mother    Hypertension Mother    Asthma Mother    Gallstones Mother    Diabetes Father    Hypertension Father    Gallstones Father    Bipolar disorder Sister    COPD Brother    Schizophrenia Brother    COPD Brother    Hypertension Brother     Social History   Tobacco Use   Smoking status: Never   Smokeless tobacco: Never  Vaping Use   Vaping Use: Never used  Substance Use Topics   Alcohol use: Never   Drug use: No    Home Medications Prior to Admission medications   Medication Sig Start Date End Date Taking? Authorizing Provider  oxyCODONE (ROXICODONE) 5 MG immediate release tablet Take 1 tablet (5 mg total) by mouth every 8 (eight) hours as needed for breakthrough pain.  05/31/21 05/31/22 Yes Evon Slack, PA-C  Adapalene 0.3 % gel Apply 1 application topically at bedtime. 10/20/19   [provider]  albuterol (PROVENTIL) (2.5 MG/3ML) 0.083% nebulizer solution Take 3 mLs (2.5 mg total) by nebulization every 4 (four) hours as needed for wheezing or shortness of breath. 09/23/19 10/26/20  Salena Saner, MD  amphetamine-dextroamphetamine (ADDERALL XR) 15 MG 24 hr capsule TAKE 1 CAPSULE BY MOUTH [facility] MORNING 12/15/20   [provider]  budesonide-formoterol (SYMBICORT) 160-4.5 MCG/ACT inhaler Inhale 2 puffs into the lungs 2 (two) times daily. 07/16/20 10/26/20  Glenford Bayley, NP  buprenorphine Lavera Guise) 15 MCG/HR Place onto the skin. 10/01/20   [provider]  Calcium-Magnesium 500-250 MG TABS Take 1 tablet by mouth 2 (two) times daily.     [provider]  cyanocobalamin (,VITAMIN B-12,) 1000 MCG/ML injection Inject 1,000 mcg into the muscle every 30 (thirty) days.    [provider]  cyanocobalamin (,VITAMIN B-12,) 1000 MCG/ML injection Inject 1 mL (1,000 mcg total) into the muscle every 30 (thirty) days. 02/28/21   Rosey Bath, MD  Dexlansoprazole 30 MG capsule Take 30 mg by mouth daily.    [provider]  diclofenac Sodium (VOLTAREN) 1 % GEL Apply 2 g topically 4 (four) times daily. 03/12/20   Dartha Lodge, PA-C  diltiazem (CARDIZEM CD) 360 MG 24 hr capsule Take 360 mg by mouth every morning. 12/18/18   [provider]  escitalopram (LEXAPRO) 20 MG tablet Take 20 mg by mouth daily.    [provider]  fluticasone (FLONASE) 50 MCG/ACT nasal spray Place 2 sprays into both nostrils daily as needed for allergies. 07/16/20   Glenford Bayley, NP  furosemide (LASIX) 20 MG tablet Take 40 mg by mouth 2 (two) times daily.     [provider]  gabapentin (NEURONTIN) 400 MG capsule Take 400 mg by mouth 3 (three) times daily.    [provider]  hydrocortisone 2.5 % cream  Apply to eczema on face 1-2 times a day as needed until rash improved. 03/15/21   Willeen Niece, MD  hydroxypropyl methylcellulose / hypromellose (ISOPTO TEARS / GONIOVISC) 2.5 % ophthalmic solution Place 1 drop into both eyes 3 (three) times daily as needed for dry eyes.    [provider]  hydrOXYzine (ATARAX/VISTARIL) 10 MG tablet Take 10 mg by mouth 2 (two) times daily. 11/18/19   [provider]  Iron-Vitamin C 65-125 MG TABS Take 1 tablet by mouth 2 (two) times daily. 09/19/19   Rosey Bath, MD  isosorbide mononitrate (  IMDUR) 30 MG 24 hr tablet Take 30 mg by mouth daily. 03/08/20   [provider]  ketoconazole (NIZORAL) 2 % cream Qd to bid aa itchy rash face, scalp, under breast until clear, then prn flares 04/07/21   Willeen Niece, MD  ketoconazole (NIZORAL) 2 % shampoo Wash scalp 1-2 times a week, let sit 5 minutes and rinse out 04/07/21   Willeen Niece, MD  lactulose [facility]) 10 GM/15ML solution Take 30 g by mouth daily as needed for mild constipation. 08/28/18   [provider]  lamoTRIgine (LAMICTAL) 200 MG tablet Take 1 tablet by mouth 2 (two) times a day. 05/20/19   [provider]  LATUDA 20 MG TABS tablet TAKE 1 TABLET BY MOUTH EACH EVENING WITH FOOD. 12/15/20   [provider]  linaclotide (LINZESS) 290 MCG CAPS capsule Take 290 mcg by mouth daily before breakfast.    [provider]  mometasone (ELOCON) 0.1 % cream Apply to more severe areas eczema 1-2 times as directed. Avoid face, groin, underarms. 03/15/21   Willeen Niece, MD  montelukast (SINGULAIR) 10 MG tablet Take 1 tablet (10 mg total) by mouth at bedtime. 07/16/20   Glenford Bayley, NP  ondansetron (ZOFRAN ODT) 8 MG disintegrating tablet Take 1 tablet (8 mg total) by mouth every 8 (eight) hours as needed for nausea or vomiting. 08/03/20   Rodriguez-Southworth, Nettie Elm, PA-C  pimecrolimus (ELIDEL) 1 % cream APPLY TOPICALLY TO AREAS ON FACE, ARMS AND ABDOMEN TWICE  DAILY 04/07/21   Willeen Niece, MD  Potassium Chloride ER 20 MEQ TBCR Take 20 mEq by mouth 2 (two) times daily.     [provider]  PROAIR RESPICLICK 108 [phone] Base) MCG/ACT AEPB INHALE 2 PUFFS INTO THE LUNGS EVERY 6 HOURS AS NEEDED. 07/16/20   Salena Saner, MD  propranolol (INDERAL) 40 MG tablet Take 2 tablets (80 mg total) by mouth 3 (three) times daily. 10/13/18   Salary, Evelena Asa, MD  rivaroxaban (XARELTO) 20 MG TABS tablet Take 1 tablet (20 mg total) by mouth every morning. 02/28/21   Rosey Bath, MD  rosuvastatin (CRESTOR) 20 MG tablet Take 20 mg by mouth daily.    [provider]  SPIRIVA RESPIMAT 1.25 MCG/ACT AERS INHALE 2 PUFFS INTO THE LUNGS DAILY. 05/13/21   Glenford Bayley, NP  SYRINGE/NEEDLE, DISP, 1 ML (B-D 1CC SLIP TIP SYR 25GX5/8") 25G X 5/8" 1 ML MISC Inject 1 mL into the muscle once a week. 02/28/21   Rosey Bath, MD  Topiramate ER (TROKENDI XR) 200 MG CP24 Take 1 capsule by mouth daily.     [provider]  tretinoin (RETIN-A) 0.05 % cream Apply a pea-sized amount to face every night as tolerated for acne. 03/15/21   Willeen Niece, MD  vitamin C (VITAMIN C) 250 MG tablet Take 1 tablet (250 mg total) by mouth 2 (two) times daily. 10/13/18   Salary, Evelena Asa, MD  dicyclomine (BENTYL) 10 MG capsule Take 1 capsule (10 mg total) by mouth every 6 (six) hours as needed for up to 7 days for spasms (abdominal pain). 04/18/20 08/03/20  Sharman Cheek, MD    Allergies    Cortizone-10 [hydrocortisone], Garlic, Prednisone, Contrast media [iodinated diagnostic agents], Corticosteroids, and Penicillins  Review of Systems   Review of Systems  Constitutional:  Negative for fever.  Respiratory:  Negative for cough and shortness of breath.   Gastrointestinal:  Negative for nausea and vomiting.  Musculoskeletal:  Positive for arthralgias, joint swelling and  myalgias.  Skin:  Negative for rash and wound.   Physical Exam Updated Vital Signs BP (!)  141/92   Pulse 75   Temp 98.8 F (37.1 C)   Resp 19   Ht 5\' 10"  (1.778 m)   Wt (!) 169 kg   LMP 05/24/2021   SpO2 97%   BMI 53.46 kg/m   Physical Exam Constitutional:      Appearance: She is well-developed.  HENT:     Head: Normocephalic and atraumatic.  Eyes:     Conjunctiva/sclera: Conjunctivae normal.  Cardiovascular:     Rate and Rhythm: Normal rate.  Pulmonary:     Effort: Pulmonary effort is normal. No respiratory distress.  Musculoskeletal:        General: Normal range of motion.     Cervical back: Normal range of motion.     Comments: Negative logroll test.  Right knee with no noticeable effusion, she has significant morbid obesity and is hard to distinguish an effusion.  Knee sizes are symmetrical.  There is no warmth or redness.  She is able to actively straight leg raise and flex the knee to 90 degrees.  No ligamentous laxity with valgus varus stress testing.  Negative Homans' sign with normal dorsalis pedis pulses in right lower extremity  Skin:    Findings: No rash.     Comments: No swelling warmth redness or edema throughout the right lower extremity.  Neurological:     Mental Status: She is alert and oriented to person, place, and time.  Psychiatric:        Behavior: Behavior normal.        Thought Content: Thought content normal.    ED Results / Procedures / Treatments   Labs (all labs ordered are listed, but only abnormal results are displayed) Labs Reviewed - No data to display  EKG None  Radiology No results found.  Procedures Procedures   Medications Ordered in ED Medications  oxyCODONE-acetaminophen (PERCOCET/ROXICET) 5-325 MG per tablet 1 tablet (1 tablet Oral Given 05/31/21 1650)    ED Course  I have reviewed the triage vital signs and the nursing notes.  Pertinent labs & imaging results that were available during my care of the patient were reviewed by me and considered in my medical decision making (see chart for details).    MDM  Rules/Calculators/A&P                          35 year old female with acute right knee pain that developed back in March 2022, x-rays were negative.  She reports of recent MRIs showing right knee lateral meniscus tear. Her symptoms are consistent with lateral meniscus tear. No new injury. No signs of DVT. She is educated on conservative treatment. She will take oxycodone for pain and use a Demarcus for ambulation. She understands signs and symptoms to return to the ER for.     Final Clinical Impression(s) / ED Diagnoses Final diagnoses:  Mechanical knee pain, right    Rx / DC Orders ED Discharge Orders          Ordered    oxyCODONE (ROXICODONE) 5 MG immediate release tablet  Every 8 hours PRN        05/31/21 1638             Ronnette Juniper 05/31/21 1704    Sharman Cheek, MD 05/31/21 2123

## 2021-05-31 NOTE — Discharge Instructions (Addendum)
Please call your orthopedic office tomorrow to see if he can get a quicker follow-up appointment.  In the meantime please rest ice and elevate right lower extremity and use a Rozman for ambulation.  You may use oxycodone for breakthrough pain.  Return to the ER for any worsening symptoms or urgent changes in your health

## 2021-05-31 NOTE — ED Triage Notes (Signed)
Pt comes with c/o right knee pain and some back pain. Pt states pain in knee has gotten worse. Pt states she had MRI performed on knee awhile back and not sure if it is the same issue.

## 2021-06-06 ENCOUNTER — Other Ambulatory Visit: Payer: Self-pay

## 2021-06-06 ENCOUNTER — Other Ambulatory Visit: Payer: Medicare Other

## 2021-06-06 DIAGNOSIS — D500 Iron deficiency anemia secondary to blood loss (chronic): Secondary | ICD-10-CM

## 2021-06-07 ENCOUNTER — Ambulatory Visit: Payer: Medicare Other

## 2021-06-07 ENCOUNTER — Inpatient Hospital Stay: Payer: Medicare Other | Attending: Internal Medicine

## 2021-06-07 ENCOUNTER — Ambulatory Visit: Payer: Medicare Other | Admitting: Nurse Practitioner

## 2021-06-08 ENCOUNTER — Ambulatory Visit: Payer: Medicare Other | Admitting: Oncology

## 2021-06-08 ENCOUNTER — Ambulatory Visit: Payer: Medicare Other

## 2021-06-09 ENCOUNTER — Other Ambulatory Visit: Payer: Self-pay

## 2021-06-09 MED ORDER — ELIDEL 1 % EX CREA: TOPICAL_CREAM | CUTANEOUS | 1 refills | Status: DC

## 2021-06-14 ENCOUNTER — Other Ambulatory Visit: Payer: Self-pay | Admitting: Primary Care

## 2021-06-15 ENCOUNTER — Ambulatory Visit: Payer: Medicare Other

## 2021-06-15 ENCOUNTER — Ambulatory Visit: Payer: Medicare Other | Admitting: Oncology

## 2021-06-21 ENCOUNTER — Inpatient Hospital Stay: Payer: Medicare Other | Attending: Oncology

## 2021-06-21 DIAGNOSIS — D509 Iron deficiency anemia, unspecified: Secondary | ICD-10-CM | POA: Insufficient documentation

## 2021-06-21 DIAGNOSIS — E538 Deficiency of other specified B group vitamins: Secondary | ICD-10-CM | POA: Insufficient documentation

## 2021-06-21 DIAGNOSIS — D500 Iron deficiency anemia secondary to blood loss (chronic): Secondary | ICD-10-CM

## 2021-06-21 LAB — CBC WITH DIFFERENTIAL/PLATELET
Abs Immature Granulocytes: 0.01 10*3/uL (ref 0.00–0.07)
Basophils Absolute: 0 10*3/uL (ref 0.0–0.1)
Basophils Relative: 1 %
Eosinophils Absolute: 0.1 10*3/uL (ref 0.0–0.5)
Eosinophils Relative: 1 %
HCT: 38.8 % (ref 36.0–46.0)
Hemoglobin: 12.4 g/dL (ref 12.0–15.0)
Immature Granulocytes: 0 %
Lymphocytes Relative: 49 %
Lymphs Abs: 2.7 10*3/uL (ref 0.7–4.0)
MCH: 26.8 pg (ref 26.0–34.0)
MCHC: 32 g/dL (ref 30.0–36.0)
MCV: 84 fL (ref 80.0–100.0)
Monocytes Absolute: 0.4 10*3/uL (ref 0.1–1.0)
Monocytes Relative: 7 %
Neutro Abs: 2.3 10*3/uL (ref 1.7–7.7)
Neutrophils Relative %: 42 %
Platelets: 225 10*3/uL (ref 150–400)
RBC: 4.62 MIL/uL (ref 3.87–5.11)
RDW: 14.4 % (ref 11.5–15.5)
WBC: 5.4 10*3/uL (ref 4.0–10.5)
nRBC: 0 % (ref 0.0–0.2)

## 2021-06-21 LAB — FOLATE: Folate: 15.4 ng/mL (ref >5.9)

## 2021-06-21 LAB — FERRITIN: Ferritin: 34 ng/mL (ref 11–307)

## 2021-06-22 ENCOUNTER — Ambulatory Visit: Payer: Medicare Other

## 2021-06-22 ENCOUNTER — Ambulatory Visit: Payer: Medicare Other | Admitting: Oncology

## 2021-06-23 ENCOUNTER — Inpatient Hospital Stay: Payer: Medicare Other

## 2021-06-23 ENCOUNTER — Other Ambulatory Visit: Payer: Self-pay

## 2021-06-23 VITALS — BP 136/84 | HR 84 | Temp 97.4°F | Resp 20

## 2021-06-23 DIAGNOSIS — E538 Deficiency of other specified B group vitamins: Secondary | ICD-10-CM

## 2021-06-23 DIAGNOSIS — D500 Iron deficiency anemia secondary to blood loss (chronic): Secondary | ICD-10-CM

## 2021-06-23 DIAGNOSIS — D509 Iron deficiency anemia, unspecified: Secondary | ICD-10-CM | POA: Diagnosis not present

## 2021-06-23 LAB — PREGNANCY, URINE: Preg Test, Ur: NEGATIVE

## 2021-06-23 MED ORDER — SODIUM CHLORIDE 0.9 % IV SOLN
Freq: Once | INTRAVENOUS | Status: AC
  Filled 2021-06-23: qty 250

## 2021-06-23 MED ORDER — IRON SUCROSE 20 MG/ML IV SOLN
200.0000 mg | Freq: Once | INTRAVENOUS | Status: AC
  Administered 2021-06-23: 200 mg via INTRAVENOUS

## 2021-06-23 MED ORDER — SODIUM CHLORIDE 0.9 % IV SOLN
Freq: Once | INTRAVENOUS | Status: DC
  Filled 2021-06-23: qty 250

## 2021-06-23 MED ORDER — IRON SUCROSE 20 MG/ML IV SOLN: 200.0000 mg | Freq: Once | INTRAVENOUS | Status: AC

## 2021-06-23 MED ORDER — SODIUM CHLORIDE 0.9 % IV SOLN: 200.0000 mg | Freq: Once | INTRAVENOUS | Status: DC

## 2021-06-24 ENCOUNTER — Telehealth: Payer: Self-pay | Admitting: Primary Care

## 2021-06-24 NOTE — Telephone Encounter (Signed)
Lm for patient.   Patient has been seeing Dr. Welton Flakes.

## 2021-06-27 MED ORDER — PROAIR RESPICLICK 108 (90 BASE) MCG/ACT IN AEPB: 2.0000 | INHALATION_SPRAY | Freq: Four times a day (QID) | RESPIRATORY_TRACT | 0 refills | Status: AC | PRN

## 2021-06-27 NOTE — Telephone Encounter (Signed)
Last OV 06/2020 with pending OV for 07/07/2021. One month supply of ventolin has been sent to preferred pharmacy. Patient is aware and voiced her understanding.  Nothing further needed at this time.

## 2021-06-29 ENCOUNTER — Other Ambulatory Visit: Payer: Self-pay | Admitting: *Deleted

## 2021-06-29 ENCOUNTER — Encounter: Payer: Self-pay | Admitting: Oncology

## 2021-06-29 ENCOUNTER — Inpatient Hospital Stay: Payer: Medicare Other | Attending: Oncology | Admitting: Oncology

## 2021-06-29 ENCOUNTER — Other Ambulatory Visit: Payer: Self-pay

## 2021-06-29 VITALS — BP 138/87 | HR 88 | Temp 99.3°F | Resp 16 | Ht 70.0 in | Wt 369.0 lb

## 2021-06-29 DIAGNOSIS — Z836 Family history of other diseases of the respiratory system: Secondary | ICD-10-CM | POA: Diagnosis not present

## 2021-06-29 DIAGNOSIS — Z79899 Other long term (current) drug therapy: Secondary | ICD-10-CM | POA: Diagnosis not present

## 2021-06-29 DIAGNOSIS — R5383 Other fatigue: Secondary | ICD-10-CM | POA: Insufficient documentation

## 2021-06-29 DIAGNOSIS — Z8249 Family history of ischemic heart disease and other diseases of the circulatory system: Secondary | ICD-10-CM | POA: Insufficient documentation

## 2021-06-29 DIAGNOSIS — I509 Heart failure, unspecified: Secondary | ICD-10-CM | POA: Insufficient documentation

## 2021-06-29 DIAGNOSIS — I110 Hypertensive heart disease with heart failure: Secondary | ICD-10-CM | POA: Insufficient documentation

## 2021-06-29 DIAGNOSIS — Z880 Allergy status to penicillin: Secondary | ICD-10-CM | POA: Insufficient documentation

## 2021-06-29 DIAGNOSIS — D509 Iron deficiency anemia, unspecified: Secondary | ICD-10-CM

## 2021-06-29 DIAGNOSIS — F319 Bipolar disorder, unspecified: Secondary | ICD-10-CM | POA: Insufficient documentation

## 2021-06-29 DIAGNOSIS — Z9049 Acquired absence of other specified parts of digestive tract: Secondary | ICD-10-CM | POA: Insufficient documentation

## 2021-06-29 DIAGNOSIS — Z86711 Personal history of pulmonary embolism: Secondary | ICD-10-CM | POA: Diagnosis present

## 2021-06-29 DIAGNOSIS — Z888 Allergy status to other drugs, medicaments and biological substances status: Secondary | ICD-10-CM | POA: Insufficient documentation

## 2021-06-29 DIAGNOSIS — M7989 Other specified soft tissue disorders: Secondary | ICD-10-CM | POA: Insufficient documentation

## 2021-06-29 DIAGNOSIS — Z8379 Family history of other diseases of the digestive system: Secondary | ICD-10-CM | POA: Insufficient documentation

## 2021-06-29 DIAGNOSIS — Z833 Family history of diabetes mellitus: Secondary | ICD-10-CM | POA: Insufficient documentation

## 2021-06-29 DIAGNOSIS — E538 Deficiency of other specified B group vitamins: Secondary | ICD-10-CM | POA: Insufficient documentation

## 2021-06-29 DIAGNOSIS — Z818 Family history of other mental and behavioral disorders: Secondary | ICD-10-CM | POA: Insufficient documentation

## 2021-06-29 DIAGNOSIS — E669 Obesity, unspecified: Secondary | ICD-10-CM | POA: Diagnosis not present

## 2021-06-29 MED ORDER — CYANOCOBALAMIN 1000 MCG/ML IJ SOLN: 1000.0000 ug | INTRAMUSCULAR | 11 refills | Status: DC

## 2021-06-29 MED ORDER — RIVAROXABAN 20 MG PO TABS: 20.0000 mg | ORAL_TABLET | Freq: Every morning | ORAL | 2 refills | Status: DC

## 2021-06-29 NOTE — Progress Notes (Signed)
Hematology/Oncology Consult note [facility]  Telephone:([phone] [phone]  Patient Care Team: Emogene Morgan, MD as PCP - General (Family Medicine) Cassell Clement, MD as Referring Physician (Obstetrics and Gynecology) Rosey Bath, MD as Referring Physician (Hematology and Oncology)   Name of the patient: Andrea Miller  [id number]  [birth year]   Date of visit: 06/29/21  Diagnosis-history of pulmonary embolism and iron deficiency and B12 deficiency anemia  Chief complaint/ Reason for visit-routine follow-up of anemia  Heme/Onc history: Patient is a 35 year old African-American female with a history of bilateral pulmonary emboli that was diagnosed in October 2019.  Lower extremity Doppler at that time did not reveal any evidence of DVT.Hypercoagulable work-up on 10/11/2018 revealed the following negative studies: factor V Leiden, prothrombin gene mutation, anti-cardiolipin antibodies, and beta-2 glycoprotein antibodies.  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  Protein C, protein S, and ATIII were normal on 01/15/2019.  Lupus anticoagulant testing was negative on 04/24/2019.  Obesity and birth control pills were considered to be putative risk factors.  Patient is currently on Xarelto for this  She also has a history of iron and B12 deficiency anemia.  Etiology of iron deficiency has been attributed to heavy menstrual bleeding.  Interval history-patient reports chronic bilateral lower extremity swelling for which she is On Lasix.  She is taking her Xarelto regularly.  ECOG PS- 1 Pain scale- 0   Review of systems- Review of Systems  Constitutional:  Positive for malaise/fatigue. Negative for chills, fever and weight loss.  HENT:  Negative for congestion, ear discharge and nosebleeds.   Eyes:  Negative for blurred vision.  Respiratory:  Negative for cough, hemoptysis, sputum production, shortness of breath and wheezing.    Cardiovascular:  Positive for leg swelling. Negative for chest pain, palpitations, orthopnea and claudication.  Gastrointestinal:  Negative for abdominal pain, blood in stool, constipation, diarrhea, heartburn, melena, nausea and vomiting.  Genitourinary:  Negative for dysuria, flank pain, frequency, hematuria and urgency.  Musculoskeletal:  Negative for back pain, joint pain and myalgias.  Skin:  Negative for rash.  Neurological:  Negative for dizziness, tingling, focal weakness, seizures, weakness and headaches.  Endo/Heme/Allergies:  Does not bruise/bleed easily.  Psychiatric/Behavioral:  Negative for depression and suicidal ideas. The patient does not have insomnia.       Allergies  Allergen Reactions   Cortizone-10 [Hydrocortisone] Shortness Of Breath and Swelling   Garlic Anaphylaxis   Prednisone Swelling    Tongue swelling   Contrast Media [Iodinated Diagnostic Agents]     Pt reports that "every time" she receives contrast she "can't breathe" for a short time. At time of arrival to room after CT scan, she is breathing normally and VS WNL.  Pt states that she thought not breathing after contrast for a little while was normal. When asked if allergic or has any reaction to IV contrast patient stated no. LP    Corticosteroids Palpitations   Penicillins Other (See Comments) and Rash    Has patient had a PCN reaction causing immediate rash, facial/tongue/throat swelling, SOB or lightheadedness with hypotension:Yes Has patient had a PCN reaction causing severe rash involving mucus membranes or skin necrosis:No Has patient had a PCN reaction that required hospitalization:No Has patient had a PCN reaction occurring within the last 10 years:Yes. If all of the above answers are "NO", then may proceed with Cephalosporin use.  Has patient had a PCN reaction causing immediate rash, facial/tongue/throat swelling, SOB or lightheadedness with hypotension:Yes Has  patient had a PCN reaction  causing severe rash involving mucus membranes or skin necrosis:No Has patient had a PCN reaction that required hospitalization:No Has patient had a PCN reaction occurring within the last 10 years:Yes. If all of the above answers are "NO", then may proceed with Cephalosporin use.   Patient does not remember Patient does not remember     Past Medical History:  Diagnosis Date   Anxiety    Asthma    Bipolar disorder (HCC)    Chest pain    and angina   CHF (congestive heart failure) (HCC)    Depression    Fibromyalgia    Hypertension    Left lumbar radiculopathy since 08/2014   secondary to work accident   Obesity    Pre-diabetes    Pulmonary embolus (HCC)    Seizures (HCC)    secondary to anxiety  last seizure 01/25/18   SVT (supraventricular tachycardia) (HCC)    with hx of syncope; managed by [facility]     Past Surgical History:  Procedure Laterality Date   CHOLECYSTECTOMY N/A 02/22/2018   Procedure: LAPAROSCOPIC CHOLECYSTECTOMY;  Surgeon: Ancil Linsey, MD;  Location: [facility] ORS;  Service: General;  Laterality: N/A;   TONSILLECTOMY AND ADENOIDECTOMY N/A 09/05/2016   Procedure: TONSILLECTOMY AND ADENOIDECTOMY;  Surgeon: Linus Salmons, MD;  Location: [facility] ORS;  Service: ENT;  Laterality: N/A;    Social History   Socioeconomic History   Marital status: Divorced    Spouse name: Not on file   Number of children: Not on file   Years of education: Not on file   Highest education level: Not on file  Occupational History   Not on file  Tobacco Use   Smoking status: Never   Smokeless tobacco: Never  Vaping Use   Vaping Use: Never used  Substance and Sexual Activity   Alcohol use: Never   Drug use: No   Sexual activity: Yes    Birth control/protection: None  Other Topics Concern   Not on file  Social History Narrative   Not on file   Social Determinants of Health   Financial Resource Strain: Not on file  Food Insecurity: Not on file  Transportation Needs:  Not on file  Physical Activity: Not on file  Stress: Not on file  Social Connections: Not on file  Intimate Partner Violence: Not on file    Family History  Problem Relation Age of Onset   Diabetes Mother    Hypertension Mother    Asthma Mother    Gallstones Mother    Diabetes Father    Hypertension Father    Gallstones Father    Bipolar disorder Sister    COPD Brother    Schizophrenia Brother    COPD Brother    Hypertension Brother      Current Outpatient Medications:    Adapalene 0.3 % gel, Apply 1 application topically at bedtime., Disp: , Rfl:    albuterol (PROVENTIL) (2.5 MG/3ML) 0.083% nebulizer solution, Take 3 mLs (2.5 mg total) by nebulization every 4 (four) hours as needed for wheezing or shortness of breath., Disp: 360 mL, Rfl: 0   Albuterol Sulfate (PROAIR RESPICLICK) 108 (90 Base) MCG/ACT AEPB, Inhale 2 puffs into the lungs every 6 (six) hours as needed., Disp: 1 each, Rfl: 0   amphetamine-dextroamphetamine (ADDERALL XR) 15 MG 24 hr capsule, TAKE 1 CAPSULE BY MOUTH EACH MORNING, Disp: , Rfl:    budesonide-formoterol (SYMBICORT) 160-4.5 MCG/ACT inhaler, Inhale 2 puffs into the lungs 2 (  two) times daily., Disp: 10.2 g, Rfl: 3   buprenorphine (BUTRANS) 15 MCG/HR, Place onto the skin., Disp: , Rfl:    Calcium-Magnesium 500-250 MG TABS, Take 1 tablet by mouth 2 (two) times daily. , Disp: , Rfl:    cyanocobalamin (,VITAMIN B-12,) 1000 MCG/ML injection, Inject 1,000 mcg into the muscle every 30 (thirty) days., Disp: , Rfl:    cyanocobalamin (,VITAMIN B-12,) 1000 MCG/ML injection, Inject 1 mL (1,000 mcg total) into the muscle every 30 (thirty) days., Disp: 1 mL, Rfl: 12   Dexlansoprazole 30 MG capsule, Take 30 mg by mouth daily., Disp: , Rfl:    diclofenac Sodium (VOLTAREN) 1 % GEL, Apply 2 g topically 4 (four) times daily., Disp: 100 g, Rfl: 0   diltiazem (CARDIZEM CD) 360 MG 24 hr capsule, Take 360 mg by mouth every morning., Disp: , Rfl:    ELIDEL 1 % cream, Apply  topically to areas on face, arms, and abdomenm, Disp: 30 g, Rfl: 1   escitalopram (LEXAPRO) 20 MG tablet, Take 20 mg by mouth daily., Disp: , Rfl:    fluticasone (FLONASE) 50 MCG/ACT nasal spray, Place 2 sprays into both nostrils daily as needed for allergies., Disp: 16 g, Rfl: 1   furosemide (LASIX) 20 MG tablet, Take 40 mg by mouth 2 (two) times daily. , Disp: , Rfl:    gabapentin (NEURONTIN) 400 MG capsule, Take 400 mg by mouth 3 (three) times daily., Disp: , Rfl:    hydrocortisone 2.5 % cream, Apply to eczema on face 1-2 times a day as needed until rash improved., Disp: 30 g, Rfl: 2   hydroxypropyl methylcellulose / hypromellose (ISOPTO TEARS / GONIOVISC) 2.5 % ophthalmic solution, Place 1 drop into both eyes 3 (three) times daily as needed for dry eyes., Disp: , Rfl:    hydrOXYzine (ATARAX/VISTARIL) 10 MG tablet, Take 10 mg by mouth 2 (two) times daily., Disp: , Rfl:    Iron-Vitamin C 65-125 MG TABS, Take 1 tablet by mouth 2 (two) times daily., Disp: 60 tablet, Rfl: 1   isosorbide mononitrate (IMDUR) 30 MG 24 hr tablet, Take 30 mg by mouth daily., Disp: , Rfl:    ketoconazole (NIZORAL) 2 % cream, Qd to bid aa itchy rash face, scalp, under breast until clear, then prn flares, Disp: 60 g, Rfl: 2   ketoconazole (NIZORAL) 2 % shampoo, Wash scalp 1-2 times a week, let sit 5 minutes and rinse out, Disp: 120 mL, Rfl: 11   lactulose (CHRONULAC) 10 GM/15ML solution, Take 30 g by mouth daily as needed for mild constipation., Disp: , Rfl: 5   lamoTRIgine (LAMICTAL) 200 MG tablet, Take 1 tablet by mouth 2 (two) times a day., Disp: , Rfl:    LATUDA 20 MG TABS tablet, TAKE 1 TABLET BY MOUTH EACH EVENING WITH FOOD., Disp: , Rfl:    linaclotide (LINZESS) 290 MCG CAPS capsule, Take 290 mcg by mouth daily before breakfast., Disp: , Rfl:    mometasone (ELOCON) 0.1 % cream, Apply to more severe areas eczema 1-2 times as directed. Avoid face, groin, underarms., Disp: 45 g, Rfl: 3   montelukast (SINGULAIR) 10 MG  tablet, Take 1 tablet (10 mg total) by mouth at bedtime., Disp: 90 tablet, Rfl: 1   ondansetron (ZOFRAN ODT) 8 MG disintegrating tablet, Take 1 tablet (8 mg total) by mouth every 8 (eight) hours as needed for nausea or vomiting., Disp: 20 tablet, Rfl: 0   oxyCODONE (ROXICODONE) 5 MG immediate release tablet, Take 1 tablet (5 mg  total) by mouth every 8 (eight) hours as needed for breakthrough pain., Disp: 15 tablet, Rfl: 0   Potassium Chloride ER 20 MEQ TBCR, Take 20 mEq by mouth 2 (two) times daily. , Disp: , Rfl:    propranolol (INDERAL) 40 MG tablet, Take 2 tablets (80 mg total) by mouth 3 (three) times daily., Disp: 90 tablet, Rfl: 0   rivaroxaban (XARELTO) 20 MG TABS tablet, Take 1 tablet (20 mg total) by mouth every morning., Disp: 30 tablet, Rfl: 2   rosuvastatin (CRESTOR) 20 MG tablet, Take 20 mg by mouth daily., Disp: , Rfl:    SPIRIVA RESPIMAT 1.25 MCG/ACT AERS, INHALE 2 PUFFS INTO THE LUNGS DAILY., Disp: 4 g, Rfl: 0   SYRINGE/NEEDLE, DISP, 1 ML (B-D 1CC SLIP TIP SYR 25GX5/8") 25G X 5/8" 1 ML MISC, Inject 1 mL into the muscle once a week., Disp: 1 each, Rfl: 3   Topiramate ER (TROKENDI XR) 200 MG CP24, Take 1 capsule by mouth daily. , Disp: , Rfl:    tretinoin (RETIN-A) 0.05 % cream, Apply a pea-sized amount to face every night as tolerated for acne., Disp: 45 g, Rfl: 11   vitamin C (VITAMIN C) 250 MG tablet, Take 1 tablet (250 mg total) by mouth 2 (two) times daily., Disp: 60 tablet, Rfl: 0 No current facility-administered medications for this visit.  Facility-Administered Medications Ordered in Other Visits:    0.9 %  sodium chloride infusion, , Intravenous, Once, Corcoran, Melissa C, MD   iron sucrose (VENOFER) injection 200 mg, 200 mg, Intravenous, Once, Creig Hinesao, Jakyia Gaccione C, MD  Physical exam:  Vitals:   06/29/21 1323 06/29/21 1324  BP:  138/87  Pulse:  88  Resp:  16  Temp:  99.3 F (37.4 C)  TempSrc:  Oral  Weight: (!) 369 lb (167.4 kg) (!) 369 lb (167.4 kg)  Height: 5\' 10"   (1.778 m) 5\' 10"  (1.778 m)   Physical Exam Constitutional:      Appearance: She is obese.  Cardiovascular:     Rate and Rhythm: Normal rate and regular rhythm.     Heart sounds: Normal heart sounds.  Pulmonary:     Effort: Pulmonary effort is normal.     Breath sounds: Normal breath sounds.  Abdominal:     General: Bowel sounds are normal.     Palpations: Abdomen is soft.  Musculoskeletal:     Comments: Trace bilateral edema  Skin:    General: Skin is warm and dry.  Neurological:     Mental Status: She is alert and oriented to person, place, and time.     CMP Latest Ref Rng & Units 02/24/2021  Glucose 70 - 99 mg/dL 161(W144(H)  BUN 6 - 20 mg/dL 7  Creatinine 9.600.44 - 4.541.00 mg/dL 0.980.52  Sodium 119135 - 147145 mmol/L 138  Potassium 3.5 - 5.1 mmol/L 3.4(L)  Chloride 98 - 111 mmol/L 103  CO2 22 - 32 mmol/L 26  Calcium 8.9 - 10.3 mg/dL 8.2(N8.8(L)  Total Protein 6.5 - 8.1 g/dL 6.9  Total Bilirubin 0.3 - 1.2 mg/dL 0.3  Alkaline Phos 38 - 126 U/L 64  AST 15 - 41 U/L 19  ALT 0 - 44 U/L 9   CBC Latest Ref Rng & Units 06/21/2021  WBC 4.0 - 10.5 K/uL 5.4  Hemoglobin 12.0 - 15.0 g/dL 56.212.4  Hematocrit 13.036.0 - 46.0 % 38.8  Platelets 150 - 400 K/uL 225     Assessment and plan- Patient is a 35 y.o. female with history of  small bilateral pulmonary emboli in October 2019 as well as iron and B12 deficiency anemia here for routine follow-up  History of pulmonary embolism: Possibly thought to be secondary to birth control pills and obesity.  Hypercoagulable work-up negative.  This was diagnosed in 2019 and patient is on Xarelto since then.  We discussed potentially stopping her Xarelto at some point if her D-dimer levels were to be less than 250.  Patient is hesitant to stop anticoagulation given her ongoing bilateral lower extremity swelling and the fact that obesity remains a risk factor.  She will therefore continue Xarelto for now but we will check a D-dimer level in 3 months.    History of iron B12  deficiency:CBC done on 06/21/2021 did not show any evidence of anemia.  H&H normal at 12.4/38.8.  Ferritin levels normal at 34.  Folate levels normal at 15.4.  Patient continues to be on B12 shots at home and we will be renewing that prescription today.  Repeat CBC ferritin and iron studies in 3 in 6 months and I will see her back in 6 months.  Also check B12 level in 3 months.check d dimer in 3 months   Visit Diagnosis 1. History of pulmonary embolism   2. Iron deficiency anemia, unspecified iron deficiency anemia type      Dr. Owens Shark, MD, MPH [facility] at [facility] [phone] 06/29/2021 4:39 PM

## 2021-06-29 NOTE — Progress Notes (Signed)
Per pt. She got 90 day supply of xarelto so she has more. She is tired all the time. She needs dentist note to get teeth fixed by dentist. She is needing knee replacement from a fall

## 2021-07-07 ENCOUNTER — Telehealth (INDEPENDENT_AMBULATORY_CARE_PROVIDER_SITE_OTHER): Payer: Medicare Other | Admitting: Primary Care

## 2021-07-07 ENCOUNTER — Other Ambulatory Visit: Payer: Self-pay

## 2021-07-07 ENCOUNTER — Encounter: Payer: Self-pay | Admitting: Primary Care

## 2021-07-07 DIAGNOSIS — F4310 Post-traumatic stress disorder, unspecified: Secondary | ICD-10-CM | POA: Diagnosis not present

## 2021-07-07 DIAGNOSIS — R609 Edema, unspecified: Secondary | ICD-10-CM

## 2021-07-07 DIAGNOSIS — I471 Supraventricular tachycardia: Secondary | ICD-10-CM | POA: Diagnosis not present

## 2021-07-07 DIAGNOSIS — F319 Bipolar disorder, unspecified: Secondary | ICD-10-CM

## 2021-07-07 MED ORDER — BUDESONIDE-FORMOTEROL FUMARATE 160-4.5 MCG/ACT IN AERO: 2.0000 | INHALATION_SPRAY | Freq: Two times a day (BID) | RESPIRATORY_TRACT | 6 refills | Status: AC
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
## 2021-07-08 ENCOUNTER — Encounter: Payer: Self-pay | Admitting: Hematology and Oncology

## 2021-07-14 IMAGING — CR DG KNEE COMPLETE 4+V*R*
4 series · 5 of 5 positions shown · non-contrast
Comparison: None.

CLINICAL DATA: Fall 2-3 weeks ago persistent right knee pain.

EXAM:
RIGHT KNEE - COMPLETE 4+ VIEW

[knee ap]
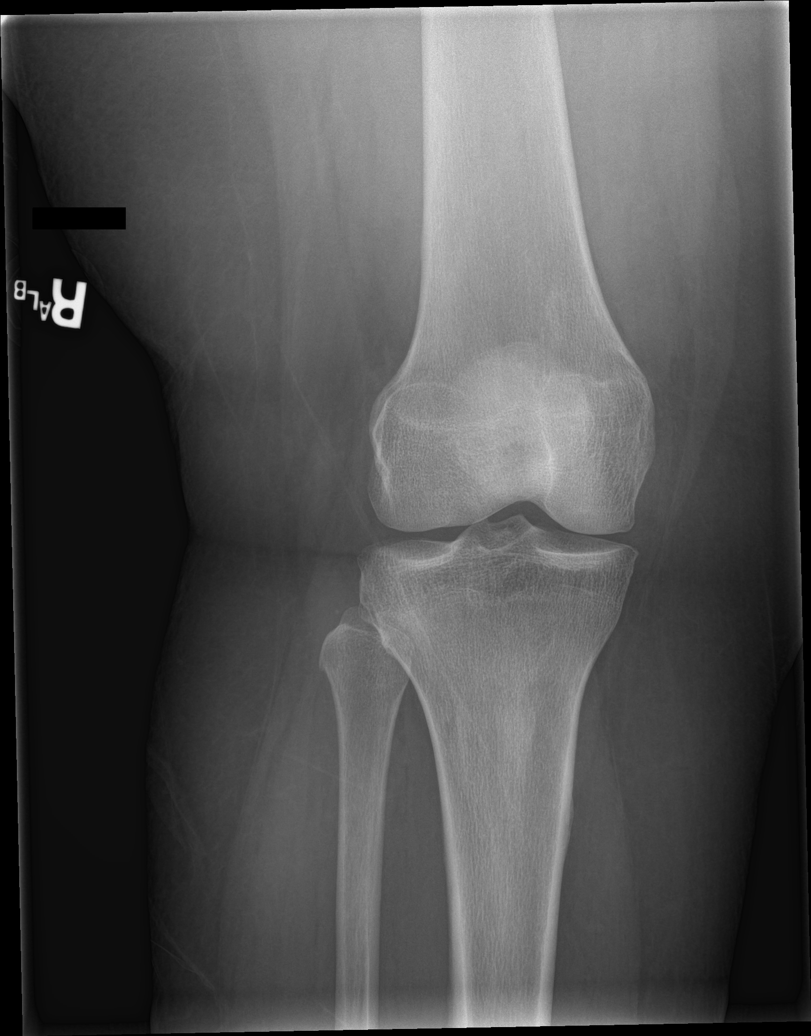

[Series 2: knee lat · 0.14mm/px · 2 of 2 slices shown]
[im 1/2]
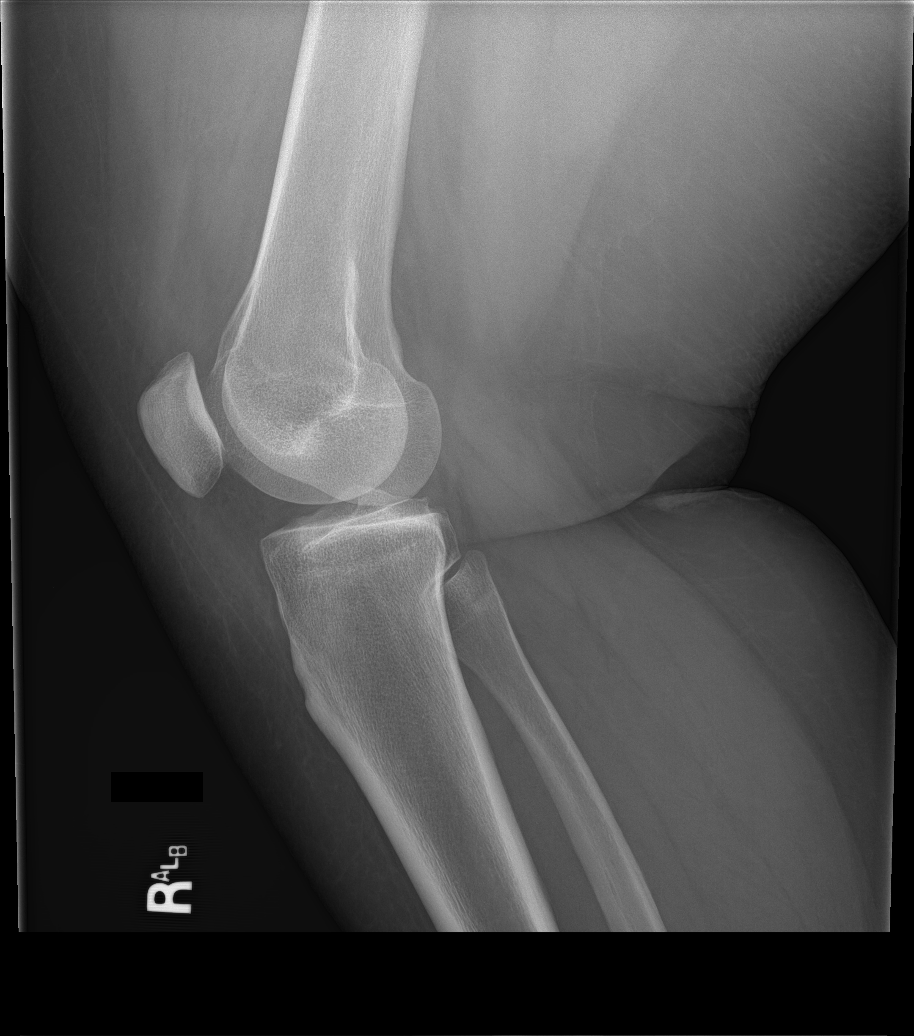
[im 2/2]
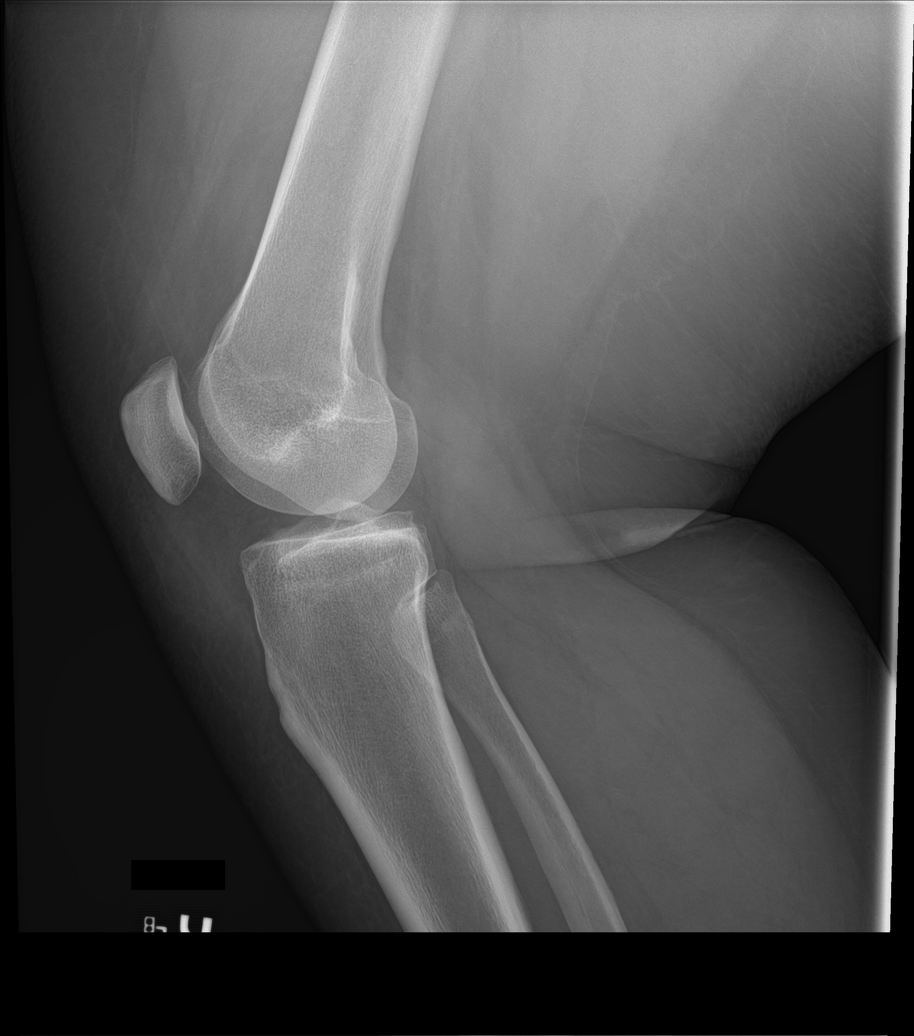

[tunnel]
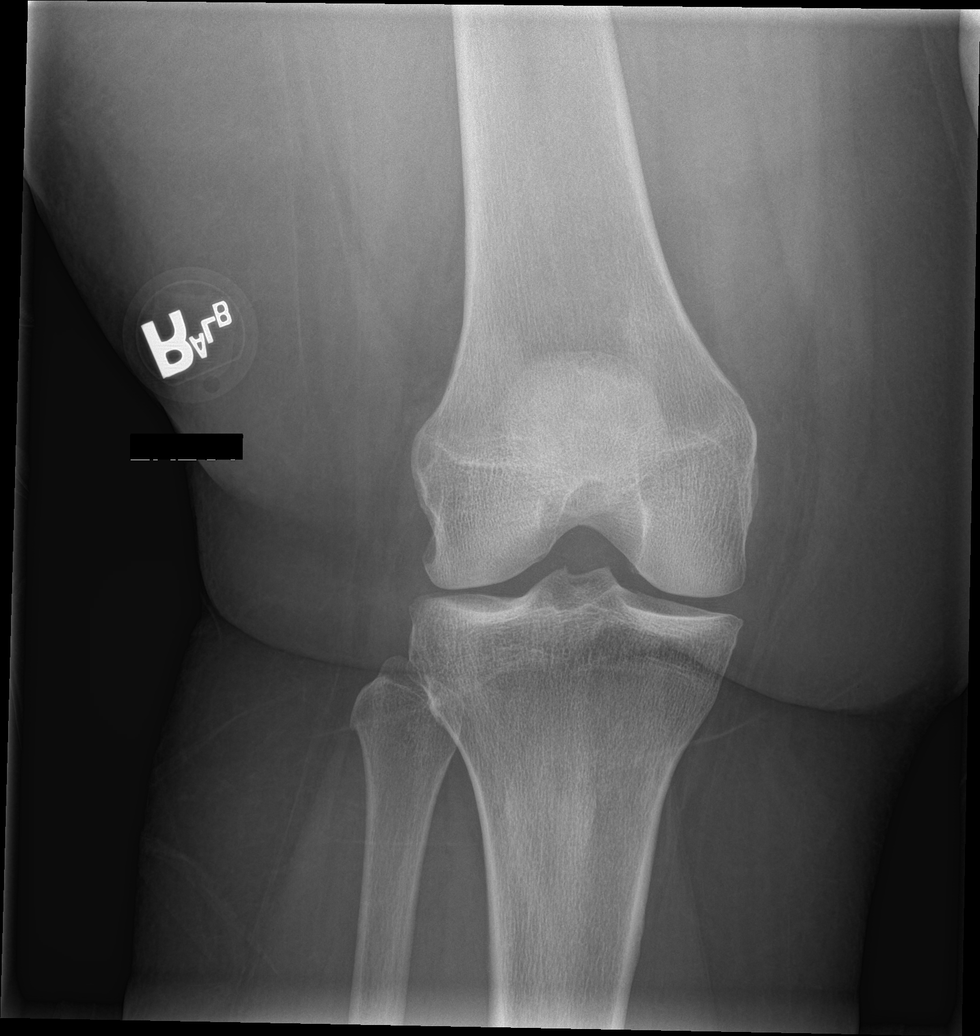

[patella skyline]
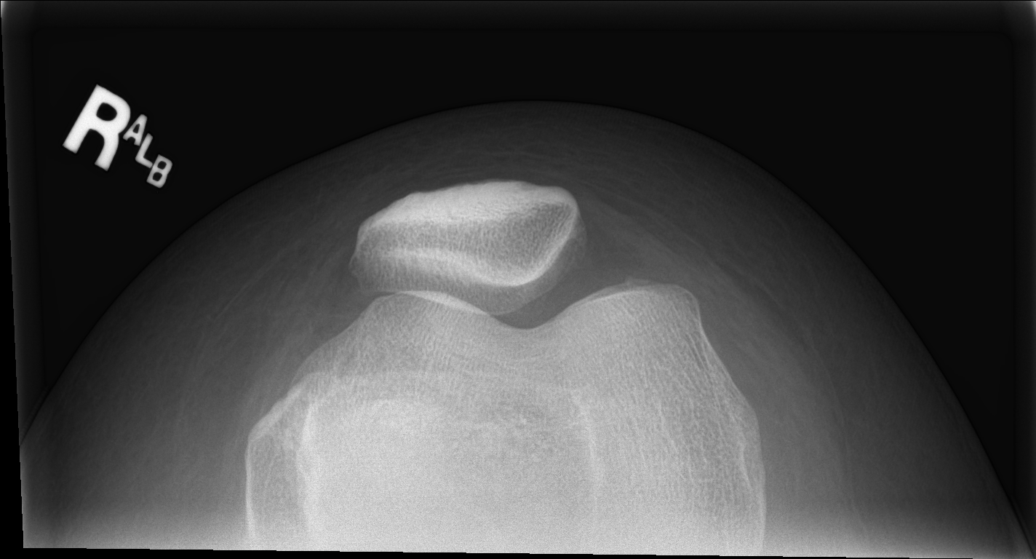

[5 of 5 positions shown; findings below may reference images not displayed]

FINDINGS: No evidence of fracture, dislocation, or joint effusion. No evidence
of arthropathy or other focal bone abnormality. Soft tissues are
unremarkable.
IMPRESSION: No acute osseous abnormality.

## 2021-07-19 ENCOUNTER — Telehealth: Payer: Self-pay | Admitting: Oncology

## 2021-07-19 NOTE — Telephone Encounter (Signed)
Pt called again. She stated the letter needs to have the following questions answered within the letter.  How long can patient be off medication before and after tooth extraction? Why is the patient taking specific medication? What are the side effects of medication and side effects of being off medication?   Wants letter faxed to [phone] and addressed to [facility].  If you need to be in contact with the office directly, [phone].  Patient would also like a copy of the letter sent to her personally.

## 2021-07-19 NOTE — Telephone Encounter (Signed)
We can send a letter to dentist to hold xarelto 2 days prior to tooth extraction and start day of or day after the procedure

## 2021-07-19 NOTE — Telephone Encounter (Signed)
Pt left VM stating she needs a letter sent to her dentist for tooth extraction approval and approval to be off blood thinners. She would like call back to discuss if this is an option for her.

## 2021-07-20 ENCOUNTER — Other Ambulatory Visit: Payer: Self-pay | Admitting: *Deleted

## 2021-07-20 NOTE — Telephone Encounter (Signed)
I have made a letter and will fax in am when I get to work

## 2021-07-20 NOTE — Progress Notes (Signed)
Letter sent.

## 2021-07-21 ENCOUNTER — Telehealth: Payer: Self-pay | Admitting: *Deleted

## 2021-07-21 NOTE — Telephone Encounter (Signed)
Called patient today to let her know that we have sent a clearance letter and faxed it to the dentistry office.  The patient herself did ask for a copy so we have printed it and sent it in the mail to her.  Patient does not know than date of her tooth extraction because the dentist office would not give her a date until she had the letter for them.  Told her hopefully they would call her in the next day or 2 because the letter went through through the fax today at 145.

## 2021-07-22 ENCOUNTER — Encounter: Payer: Self-pay | Admitting: Cardiology

## 2021-07-22 ENCOUNTER — Other Ambulatory Visit: Payer: Self-pay

## 2021-07-22 ENCOUNTER — Ambulatory Visit (INDEPENDENT_AMBULATORY_CARE_PROVIDER_SITE_OTHER): Payer: Medicare Other | Admitting: Cardiology

## 2021-07-22 VITALS — BP 132/88 | HR 91 | Ht 70.0 in | Wt 369.0 lb

## 2021-07-22 DIAGNOSIS — I10 Essential (primary) hypertension: Secondary | ICD-10-CM | POA: Diagnosis not present

## 2021-07-22 DIAGNOSIS — Z6841 Body Mass Index (BMI) 40.0 and over, adult: Secondary | ICD-10-CM | POA: Diagnosis not present

## 2021-07-22 DIAGNOSIS — I471 Supraventricular tachycardia: Secondary | ICD-10-CM | POA: Diagnosis not present

## 2021-07-22 NOTE — Progress Notes (Signed)
Cardiology Office Note:    Date:  07/22/2021   ID:  Andrea Miller, DOB [birth year], MRN [id number]  PCP:  Emogene Morgan, MD   [facility] Providers Cardiologist:  None     Referring MD: Emogene Morgan, MD   Chief Complaint  Patient presents with   New Patient (Initial Visit)    Referred by PCP for SVT, Edema, and Chest pain that comes and goes. Meds reviewed verbally with patient.     History of Present Illness:    Andrea Miller is a 35 y.o. female with a hx of asthma, anxiety, HFpEF, paroxysmal SVT, hypertension, obesity, PE on Xarelto who presents to establish care due to history of SVT.  She has a history of SVT, diagnosed after work-up for prior syncopal episodes.  Cardiac monitor showed paroxysmal SVTs.  Was started on cardizem and propanolol for this.  Has been on Cardizem and propanolol for a long time now.  Denies palpitations.  Last seen from a cardiac perspective at [facility].  Previously seen at [facility].  Missed some appointments and was discharged from the practice.  Takes Lasix for lower extremity edema.  Was interested in having bariatric surgery to help with weight loss, went through nutrition appointments for about a year, subsequently developed PE while she was on contraceptives and weight loss surgery was held.  Doing okay from a cardiac perspective, has no concerns at this time.  Takes medications as prescribed.    Past Medical History:  Diagnosis Date   Anxiety    Asthma    Bipolar disorder (HCC)    Chest pain    and angina   CHF (congestive heart failure) (HCC)    Depression    Fibromyalgia    Hypertension    Left lumbar radiculopathy since 08/2014   secondary to work accident   Obesity    Pre-diabetes    Pulmonary embolus (HCC)    Seizures (HCC)    secondary to anxiety  last seizure 01/25/18   SVT (supraventricular tachycardia) (HCC)    with hx of syncope; managed by [facility]    Past Surgical History:   Procedure Laterality Date   CHOLECYSTECTOMY N/A 02/22/2018   Procedure: LAPAROSCOPIC CHOLECYSTECTOMY;  Surgeon: Ancil Linsey, MD;  Location: [facility] ORS;  Service: General;  Laterality: N/A;   TONSILLECTOMY AND ADENOIDECTOMY N/A 09/05/2016   Procedure: TONSILLECTOMY AND ADENOIDECTOMY;  Surgeon: Linus Salmons, MD;  Location: [facility] ORS;  Service: ENT;  Laterality: N/A;    Current Medications: Current Meds  Medication Sig   Adapalene 0.3 % gel Apply 1 application topically at bedtime.   albuterol (PROVENTIL) (2.5 MG/3ML) 0.083% nebulizer solution Take 3 mLs (2.5 mg total) by nebulization every 4 (four) hours as needed for wheezing or shortness of breath.   Albuterol Sulfate (PROAIR RESPICLICK) 108 (90 Base) MCG/ACT AEPB Inhale 2 puffs into the lungs every 6 (six) hours as needed.   amphetamine-dextroamphetamine (ADDERALL XR) 15 MG 24 hr capsule TAKE 1 CAPSULE BY MOUTH EACH MORNING   budesonide-formoterol (SYMBICORT) 160-4.5 MCG/ACT inhaler Inhale 2 puffs into the lungs 2 (two) times daily.   budesonide-formoterol (SYMBICORT) 160-4.5 MCG/ACT inhaler Inhale 2 puffs into the lungs in the morning and at bedtime.   buprenorphine (BUTRANS) 15 MCG/HR Place 1 patch onto the skin once a week.   Calcium-Magnesium 500-250 MG TABS Take 1 tablet by mouth 2 (two) times daily.    cyanocobalamin (,VITAMIN B-12,) 1000 MCG/ML injection Inject 1 mL (1,000  mcg total) into the muscle every 30 (thirty) days for 12 doses.   Dexlansoprazole 30 MG capsule Take 30 mg by mouth daily.   diclofenac Sodium (VOLTAREN) 1 % GEL Apply 2 g topically 4 (four) times daily.   diltiazem (CARDIZEM CD) 360 MG 24 hr capsule Take 360 mg by mouth every morning.   ELIDEL 1 % cream Apply topically to areas on face, arms, and abdomenm   escitalopram (LEXAPRO) 20 MG tablet Take 20 mg by mouth daily.   fluticasone (FLONASE) 50 MCG/ACT nasal spray Place 2 sprays into both nostrils daily as needed for allergies.   furosemide (LASIX) 20 MG  tablet Take 40 mg by mouth daily.   gabapentin (NEURONTIN) 800 MG tablet Take 800 mg by mouth 3 (three) times daily.   hydrocortisone 2.5 % cream Apply to eczema on face 1-2 times a day as needed until rash improved.   hydroxypropyl methylcellulose / hypromellose (ISOPTO TEARS / GONIOVISC) 2.5 % ophthalmic solution Place 1 drop into both eyes 3 (three) times daily as needed for dry eyes.   isosorbide mononitrate (IMDUR) 30 MG 24 hr tablet Take 30 mg by mouth daily.   ketoconazole (NIZORAL) 2 % cream Qd to bid aa itchy rash face, scalp, under breast until clear, then prn flares   ketoconazole (NIZORAL) 2 % shampoo Wash scalp 1-2 times a week, let sit 5 minutes and rinse out   lactulose (CHRONULAC) 10 GM/15ML solution Take 30 g by mouth daily as needed for mild constipation.   lamoTRIgine (LAMICTAL) 200 MG tablet Take 1 tablet by mouth 2 (two) times a day.   linaclotide (LINZESS) 290 MCG CAPS capsule Take 290 mcg by mouth daily before breakfast.   Lurasidone HCl (LATUDA) 60 MG TABS Take by mouth.   mometasone (ELOCON) 0.1 % cream Apply to more severe areas eczema 1-2 times as directed. Avoid face, groin, underarms.   montelukast (SINGULAIR) 10 MG tablet Take 1 tablet (10 mg total) by mouth at bedtime.   Potassium Chloride ER 20 MEQ TBCR Take 20 mEq by mouth 2 (two) times daily.    propranolol (INDERAL) 80 MG tablet Take 80 mg by mouth 3 (three) times daily.   rivaroxaban (XARELTO) 20 MG TABS tablet Take 1 tablet (20 mg total) by mouth every morning.   rosuvastatin (CRESTOR) 20 MG tablet Take 20 mg by mouth daily.   SPIRIVA RESPIMAT 1.25 MCG/ACT AERS INHALE 2 PUFFS INTO THE LUNGS DAILY.   SYRINGE/NEEDLE, DISP, 1 ML (B-D 1CC SLIP TIP SYR 25GX5/8") 25G X 5/8" 1 ML MISC Inject 1 mL into the muscle once a week.   Topiramate ER (TROKENDI XR) 200 MG CP24 Take 1 capsule by mouth daily.    tretinoin (RETIN-A) 0.05 % cream Apply a pea-sized amount to face every night as tolerated for acne.     Allergies:    Cortizone-10 [hydrocortisone], Garlic, Prednisone, Contrast media [iodinated diagnostic agents], Corticosteroids, and Penicillins   Social History   Socioeconomic History   Marital status: Divorced    Spouse name: Not on file   Number of children: Not on file   Years of education: Not on file   Highest education level: Not on file  Occupational History   Not on file  Tobacco Use   Smoking status: Never   Smokeless tobacco: Never  Vaping Use   Vaping Use: Never used  Substance and Sexual Activity   Alcohol use: Not Currently   Drug use: No   Sexual activity: Yes  Birth control/protection: None  Other Topics Concern   Not on file  Social History Narrative   Not on file   Social Determinants of Health   Financial Resource Strain: Not on file  Food Insecurity: Not on file  Transportation Needs: Not on file  Physical Activity: Not on file  Stress: Not on file  Social Connections: Not on file     Family History: The patient's family history includes Asthma in her mother; Bipolar disorder in her sister; COPD in her brother and brother; Diabetes in her father and mother; Gallstones in her father and mother; Hypertension in her brother, father, and mother; Schizophrenia in her brother.  ROS:   Please see the history of present illness.     All other systems reviewed and are negative.  EKGs/Labs/Other Studies Reviewed:    The following studies were reviewed today:   EKG:  EKG is  ordered today.  The ekg ordered today demonstrates normal sinus rhythm, normal ECG.  Recent Labs: 02/24/2021: ALT 9; BUN 7; Creatinine, Ser 0.52; Potassium 3.4; Sodium 138 06/21/2021: Hemoglobin 12.4; Platelets 225  Recent Lipid Panel    Component Value Date/Time   CHOL 226 (H) 08/30/2017 1222   TRIG 80 08/30/2017 1222   HDL 72 08/30/2017 1222   CHOLHDL 3.1 08/30/2017 1222   VLDL 16 08/30/2017 1222   LDLCALC 138 (H) 08/30/2017 1222     Risk Assessment/Calculations:           Physical Exam:    VS:  BP 132/88 (BP Location: Right Arm, Patient Position: Sitting, Cuff Size: Large)   Pulse 91   Ht 5\' 10"  (1.778 m)   Wt (!) 369 lb (167.4 kg)   SpO2 97%   BMI 52.95 kg/m     Wt Readings from Last 3 Encounters:  07/22/21 (!) 369 lb (167.4 kg)  06/29/21 (!) 369 lb (167.4 kg)  05/31/21 (!) 372 lb 9.2 oz (169 kg)     GEN:  Well nourished, well developed in no acute distress HEENT: Normal NECK: No JVD; No carotid bruits LYMPHATICS: No lymphadenopathy CARDIAC: RRR, no murmurs, rubs, gallops RESPIRATORY:  Clear to auscultation without rales, wheezing or rhonchi  ABDOMEN: Soft, non-tender, distended MUSCULOSKELETAL:  No edema; No deformity  SKIN: Warm and dry NEUROLOGIC:  Alert and oriented x 3 PSYCHIATRIC:  Normal affect   ASSESSMENT:    1. Paroxysmal SVT (supraventricular tachycardia) (HCC)   2. Primary hypertension   3. Morbid obesity with BMI of 50.0-59.9, adult (HCC)    PLAN:    In order of problems listed above:  History of paroxysmal SVT EKG showing sinus rhythm.  Continue Cardizem, propranolol. Hypertension, blood pressure controlled on Cardizem and propanolol.  Continue medications. Morbid obesity, low-calorie diet, low-salt diet, graduated exercise advised.  Continue Lasix for lower extremity edema.  Follow-up in 6 months     Medication Adjustments/Labs and Tests Ordered: Current medicines are reviewed at length with the patient today.  Concerns regarding medicines are outlined above.  No orders of the defined types were placed in this encounter.  No orders of the defined types were placed in this encounter.   Patient Instructions  Medication Instructions:  Your physician recommends that you continue on your current medications as directed. Please refer to the Current Medication list given to you today.  *If you need a refill on your cardiac medications before your next appointment, please call your pharmacy*   Lab Work: None  ordered If you have labs (blood work) drawn today  and your tests are completely normal, you will receive your results only by: MyChart Message (if you have MyChart) OR A paper copy in the mail If you have any lab test that is abnormal or we need to change your treatment, we will call you to review the results.   Testing/Procedures: None ordered   Follow-Up: At [facility], you and your health needs are our priority.  As part of our continuing mission to provide you with exceptional heart care, we have created designated Provider Care Teams.  These Care Teams include your primary Cardiologist (physician) and Advanced Practice Providers (APPs -  Physician Assistants and Nurse Practitioners) who all work together to provide you with the care you need, when you need it.  We recommend signing up for the patient portal called "MyChart".  Sign up information is provided on this After Visit Summary.  MyChart is used to connect with patients for Virtual Visits (Telemedicine).  Patients are able to view lab/test results, encounter notes, upcoming appointments, etc.  Non-urgent messages can be sent to your provider as well.   To learn more about what you can do with MyChart, go to [email or web address].    Your next appointment:   6 month(s)  The format for your next appointment:   In Person  Provider:   You may see Dr. Georgiana Spinner or one of the following Advanced Practice Providers on your designated Care Team:   Nicolasa Ducking, NP Eula Listen, PA-C Marisue Ivan, PA-C Cadence [place], [place]   Other Instructions     Signed, Debbe Odea, MD  07/22/2021 4:58 PM    [facility]

## 2021-07-22 NOTE — Patient Instructions (Signed)
Medication Instructions:  Your physician recommends that you continue on your current medications as directed. Please refer to the Current Medication list given to you today.  *If you need a refill on your cardiac medications before your next appointment, please call your pharmacy*   Lab Work: None ordered If you have labs (blood work) drawn today and your tests are completely normal, you will receive your results only by: MyChart Message (if you have MyChart) OR A paper copy in the mail If you have any lab test that is abnormal or we need to change your treatment, we will call you to review the results.   Testing/Procedures: None ordered   Follow-Up: At [facility], you and your health needs are our priority.  As part of our continuing mission to provide you with exceptional heart care, we have created designated Provider Care Teams.  These Care Teams include your primary Cardiologist (physician) and Advanced Practice Providers (APPs -  Physician Assistants and Nurse Practitioners) who all work together to provide you with the care you need, when you need it.  We recommend signing up for the patient portal called "MyChart".  Sign up information is provided on this After Visit Summary.  MyChart is used to connect with patients for Virtual Visits (Telemedicine).  Patients are able to view lab/test results, encounter notes, upcoming appointments, etc.  Non-urgent messages can be sent to your provider as well.   To learn more about what you can do with MyChart, go to [email or web address].    Your next appointment:   6 month(s)  The format for your next appointment:   In Person  Provider:   You may see Dr. Georgiana Spinner or one of the following Advanced Practice Providers on your designated Care Team:   Nicolasa Ducking, NP Eula Listen, PA-C Marisue Ivan, PA-C Cadence [place], [place]   Other Instructions

## 2021-07-27 ENCOUNTER — Telehealth: Payer: Self-pay | Admitting: Family

## 2021-07-27 ENCOUNTER — Ambulatory Visit: Payer: Medicare Other | Admitting: Family

## 2021-07-27 NOTE — Telephone Encounter (Signed)
Patient did not show for her [facility] Failure [facility] appointment on 07/27/21. Will attempt to reschedule.

## 2021-07-27 NOTE — Progress Notes (Deleted)
Subjective:    Patient ID: Andrea Miller, female    DOB: 06/04/1986, 35 y.o.   MRN: [id number]  HPI  Andrea Miller is a 35 y/o female with a history of asthma, HTN, SVT, obesity, bipolar, anxiety, depression, fibromyalgia, seizures, bulging disc and chronic heart failure.   Echo report from 09/16/18 reviewed and showed an EF of 55%. Echo report from 07/04/18 reviewed and showed an EF of 73%. Echo report from 04/15/17 reviewed and showed an EF of 60-65%.  Was in the ED 05/31/21 due to right knee pain. Evaluated and released.   She presents today for a follow-up visit although hasn't been seen since 2020. She presents with a chief complaint of   Past Medical History:  Diagnosis Date   Anxiety    Asthma    Bipolar disorder (HCC)    Chest pain    and angina   CHF (congestive heart failure) (HCC)    Depression    Fibromyalgia    Hypertension    Left lumbar radiculopathy since 08/2014   secondary to work accident   Obesity    Pre-diabetes    Pulmonary embolus (HCC)    Seizures (HCC)    secondary to anxiety  last seizure 01/25/18   SVT (supraventricular tachycardia) (HCC)    with hx of syncope; managed by [facility]   Past Surgical History:  Procedure Laterality Date   CHOLECYSTECTOMY N/A 02/22/2018   Procedure: LAPAROSCOPIC CHOLECYSTECTOMY;  Surgeon: Ancil Linsey, MD;  Location: [facility] ORS;  Service: General;  Laterality: N/A;   TONSILLECTOMY AND ADENOIDECTOMY N/A 09/05/2016   Procedure: TONSILLECTOMY AND ADENOIDECTOMY;  Surgeon: Linus Salmons, MD;  Location: [facility] ORS;  Service: ENT;  Laterality: N/A;   Family History  Problem Relation Age of Onset   Diabetes Mother    Hypertension Mother    Asthma Mother    Gallstones Mother    Diabetes Father    Hypertension Father    Gallstones Father    Bipolar disorder Sister    COPD Brother    Schizophrenia Brother    COPD Brother    Hypertension Brother    Social History   Tobacco Use   Smoking status: Never   Smokeless  tobacco: Never  Substance Use Topics   Alcohol use: Not Currently   Allergies  Allergen Reactions   Cortizone-10 [Hydrocortisone] Shortness Of Breath and Swelling   Garlic Anaphylaxis   Prednisone Swelling    Tongue swelling   Contrast Media [Iodinated Diagnostic Agents]     Pt reports that "every time" she receives contrast she "can't breathe" for a short time. At time of arrival to room after CT scan, she is breathing normally and VS WNL.  Pt states that she thought not breathing after contrast for a little while was normal. When asked if allergic or has any reaction to IV contrast patient stated no. LP    Corticosteroids Palpitations   Penicillins Other (See Comments) and Rash    Has patient had a PCN reaction causing immediate rash, facial/tongue/throat swelling, SOB or lightheadedness with hypotension:Yes Has patient had a PCN reaction causing severe rash involving mucus membranes or skin necrosis:No Has patient had a PCN reaction that required hospitalization:No Has patient had a PCN reaction occurring within the last 10 years:Yes. If all of the above answers are "NO", then may proceed with Cephalosporin use.  Has patient had a PCN reaction causing immediate rash, facial/tongue/throat swelling, SOB or lightheadedness with hypotension:Yes Has patient had a PCN reaction  causing severe rash involving mucus membranes or skin necrosis:No Has patient had a PCN reaction that required hospitalization:No Has patient had a PCN reaction occurring within the last 10 years:Yes. If all of the above answers are "NO", then may proceed with Cephalosporin use.   Patient does not remember Patient does not remember      Review of Systems  Constitutional:  Positive for fatigue. Negative for appetite change.  HENT:  Negative for congestion, postnasal drip and sore throat.   Eyes:  Positive for visual disturbance (blurry vision right eye).  Respiratory:  Positive for chest tightness and  shortness of breath ("better"). Negative for cough.   Cardiovascular:  Positive for chest pain (intermittent) and leg swelling ("better"). Negative for palpitations.  Gastrointestinal:  Negative for abdominal distention and abdominal pain.  Endocrine: Negative.   Genitourinary: Negative.   Musculoskeletal:  Positive for arthralgias (leg pain) and back pain (due to bulging disc).  Skin: Negative.   Allergic/Immunologic: Negative.   Neurological:  Positive for light-headedness. Negative for dizziness and headaches.  Hematological:  Negative for adenopathy. Does not bruise/bleed easily.  Psychiatric/Behavioral:  Positive for dysphoric mood. Negative for sleep disturbance (wearing CPAP at night). The patient is not nervous/anxious.      Objective:   Physical Exam Vitals and nursing note reviewed.  Constitutional:      Appearance: She is well-developed.  HENT:     Head: Normocephalic and atraumatic.  Neck:     Vascular: No JVD.  Cardiovascular:     Rate and Rhythm: Normal rate and regular rhythm.  Pulmonary:     Effort: Pulmonary effort is normal. No respiratory distress.     Breath sounds: No wheezing or rales.  Abdominal:     General: There is no distension.     Palpations: Abdomen is soft.  Musculoskeletal:     Cervical back: Normal range of motion and neck supple.     Right lower leg: No tenderness. No edema.     Left lower leg: No tenderness. No edema.  Skin:    General: Skin is warm and dry.  Neurological:     Mental Status: She is alert and oriented to person, place, and time.  Psychiatric:        Behavior: Behavior normal.      Assessment & Plan:   1: Chronic heart failure with preserved ejection fraction- - NYHA class II - euvolemic today - not weighing daily as she's waiting on her scale from [facility] to arrive; reminded to call for an overnight weight gain of >2 pounds or a weekly weight gain of >5 pounds - not adding salt and occasionally reads food labels;  reviewed the importance of closely following a 2000mg  sodium diet  - says that she's eating "a lot" of ice - saw cardiology (Agbor-Etang) 07/22/21 - drinking ~ 64 ounces of fluid daily - BNP 03/12/20 was 18.0   2: HTN- - BP - follows with PCP (Aycock) at 03/14/20  - BMP 02/24/21 reviewed and showed sodium 138, potassium 3.4, creatinine 0.52 and GFR >60  3: Obstructive sleep apnea- - wearing CPAP nightly and feels like she's sleeping well - saw pulmonologist 02/26/21) 02/01/21  4: SVT- -

## 2021-08-11 ENCOUNTER — Ambulatory Visit: Payer: Medicare Other | Admitting: Psychology

## 2021-08-18 ENCOUNTER — Other Ambulatory Visit: Payer: Self-pay | Admitting: *Deleted

## 2021-08-18 ENCOUNTER — Encounter: Payer: Self-pay | Admitting: Family

## 2021-08-18 ENCOUNTER — Other Ambulatory Visit: Payer: Self-pay

## 2021-08-18 ENCOUNTER — Ambulatory Visit: Payer: Medicare Other | Attending: Family | Admitting: Family

## 2021-08-18 VITALS — BP 117/67 | HR 83 | Resp 20 | Ht 70.0 in | Wt 372.1 lb

## 2021-08-18 DIAGNOSIS — Z9049 Acquired absence of other specified parts of digestive tract: Secondary | ICD-10-CM | POA: Insufficient documentation

## 2021-08-18 DIAGNOSIS — Z8249 Family history of ischemic heart disease and other diseases of the circulatory system: Secondary | ICD-10-CM | POA: Diagnosis not present

## 2021-08-18 DIAGNOSIS — I5032 Chronic diastolic (congestive) heart failure: Secondary | ICD-10-CM | POA: Insufficient documentation

## 2021-08-18 DIAGNOSIS — Z880 Allergy status to penicillin: Secondary | ICD-10-CM | POA: Diagnosis not present

## 2021-08-18 DIAGNOSIS — Z888 Allergy status to other drugs, medicaments and biological substances status: Secondary | ICD-10-CM | POA: Diagnosis not present

## 2021-08-18 DIAGNOSIS — Z7951 Long term (current) use of inhaled steroids: Secondary | ICD-10-CM | POA: Diagnosis not present

## 2021-08-18 DIAGNOSIS — R0789 Other chest pain: Secondary | ICD-10-CM | POA: Insufficient documentation

## 2021-08-18 DIAGNOSIS — I110 Hypertensive heart disease with heart failure: Secondary | ICD-10-CM | POA: Diagnosis not present

## 2021-08-18 DIAGNOSIS — G4733 Obstructive sleep apnea (adult) (pediatric): Secondary | ICD-10-CM | POA: Insufficient documentation

## 2021-08-18 DIAGNOSIS — I890 Lymphedema, not elsewhere classified: Secondary | ICD-10-CM | POA: Insufficient documentation

## 2021-08-18 DIAGNOSIS — I10 Essential (primary) hypertension: Secondary | ICD-10-CM

## 2021-08-18 NOTE — Patient Instructions (Addendum)
Resume weighing daily and call for an overnight weight gain of > 2 pounds or a weekly weight gain of >5 pounds. 

## 2021-08-18 NOTE — Progress Notes (Signed)
Subjective:    Patient ID: Andrea Miller, female    DOB: 10/30/1986, 35 y.o.   MRN: [id number]  HPI  Ms Jhaveri is a 35 y/o female with a history of asthma, HTN, SVT, obesity, bipolar, anxiety, depression, fibromyalgia, seizures, bulging disc and chronic heart failure.   Echo report from 09/16/18 reviewed and showed an EF of 55%. Echo report from 07/04/18 reviewed and showed an EF of 73%. Echo report from 04/15/17 reviewed and showed an EF of 60-65%.  Was in the ED 05/31/21 due to right knee pain. Evaluated and released.   She presents today for a follow-up visit although hasn't been seen since 2020. She presents with a chief complaint of moderate fatigue upon minimal exertion. She describes this as chronic in nature having been present for several years. She has associated chest tightness, shortness of breath, pedal edema, difficulty sleeping, light-headedness and right knee pain along with this. She denies any abdominal distention, palpitations, chest pain, cough or weight gain.   She has been intermittent wearing compression socks or wrapping her lower legs with ACE bandages.   Past Medical History:  Diagnosis Date   Anxiety    Asthma    Bipolar disorder (HCC)    Chest pain    and angina   CHF (congestive heart failure) (HCC)    Depression    Fibromyalgia    Hypertension    Left lumbar radiculopathy since 08/2014   secondary to work accident   Obesity    Pre-diabetes    Pulmonary embolus (HCC)    Seizures (HCC)    secondary to anxiety  last seizure 01/25/18   SVT (supraventricular tachycardia) (HCC)    with hx of syncope; managed by [facility]   Past Surgical History:  Procedure Laterality Date   CHOLECYSTECTOMY N/A 02/22/2018   Procedure: LAPAROSCOPIC CHOLECYSTECTOMY;  Surgeon: Ancil Linsey, MD;  Location: [facility] ORS;  Service: General;  Laterality: N/A;   TONSILLECTOMY AND ADENOIDECTOMY N/A 09/05/2016   Procedure: TONSILLECTOMY AND ADENOIDECTOMY;  Surgeon: Linus Salmons, MD;  Location: [facility] ORS;  Service: ENT;  Laterality: N/A;   Family History  Problem Relation Age of Onset   Diabetes Mother    Hypertension Mother    Asthma Mother    Gallstones Mother    Diabetes Father    Hypertension Father    Gallstones Father    Bipolar disorder Sister    COPD Brother    Schizophrenia Brother    COPD Brother    Hypertension Brother    Social History   Tobacco Use   Smoking status: Never   Smokeless tobacco: Never  Substance Use Topics   Alcohol use: Not Currently   Allergies  Allergen Reactions   Cortizone-10 [Hydrocortisone] Shortness Of Breath and Swelling   Garlic Anaphylaxis   Prednisone Swelling    Tongue swelling   Contrast Media [Iodinated Diagnostic Agents]     Pt reports that "every time" she receives contrast she "can't breathe" for a short time. At time of arrival to room after CT scan, she is breathing normally and VS WNL.  Pt states that she thought not breathing after contrast for a little while was normal. When asked if allergic or has any reaction to IV contrast patient stated no. LP    Corticosteroids Palpitations   Penicillins Other (See Comments) and Rash    Has patient had a PCN reaction causing immediate rash, facial/tongue/throat swelling, SOB or lightheadedness with hypotension:Yes Has patient had a PCN reaction  causing severe rash involving mucus membranes or skin necrosis:No Has patient had a PCN reaction that required hospitalization:No Has patient had a PCN reaction occurring within the last 10 years:Yes. If all of the above answers are "NO", then may proceed with Cephalosporin use.  Has patient had a PCN reaction causing immediate rash, facial/tongue/throat swelling, SOB or lightheadedness with hypotension:Yes Has patient had a PCN reaction causing severe rash involving mucus membranes or skin necrosis:No Has patient had a PCN reaction that required hospitalization:No Has patient had a PCN reaction occurring  within the last 10 years:Yes. If all of the above answers are "NO", then may proceed with Cephalosporin use.   Patient does not remember Patient does not remember   Prior to Admission medications   Medication Sig Start Date End Date Taking? Authorizing Provider  Adapalene 0.3 % gel Apply 1 application topically at bedtime. 10/20/19  Yes [provider]  albuterol (PROVENTIL) (2.5 MG/3ML) 0.083% nebulizer solution Take 3 mLs (2.5 mg total) by nebulization every 4 (four) hours as needed for wheezing or shortness of breath. 09/23/19  Yes Salena Saner, MD  Albuterol Sulfate (PROAIR RESPICLICK) 108 (90 Base) MCG/ACT AEPB Inhale 2 puffs into the lungs every 6 (six) hours as needed. 06/27/21  Yes Glenford Bayley, NP  amphetamine-dextroamphetamine (ADDERALL XR) 15 MG 24 hr capsule TAKE 1 CAPSULE BY MOUTH EACH MORNING 12/15/20  Yes [provider]  budesonide-formoterol (SYMBICORT) 160-4.5 MCG/ACT inhaler Inhale 2 puffs into the lungs 2 (two) times daily. 07/16/20  Yes Glenford Bayley, NP  budesonide-formoterol [facility]) 160-4.5 MCG/ACT inhaler Inhale 2 puffs into the lungs in the morning and at bedtime. 07/07/21  Yes Glenford Bayley, NP  buprenorphine Lavera Guise) 15 MCG/HR Place 1 patch onto the skin once a week. 10/01/20  Yes [provider]  Calcium-Magnesium 500-250 MG TABS Take 1 tablet by mouth 2 (two) times daily.    Yes [provider]  cyanocobalamin (,VITAMIN B-12,) 1000 MCG/ML injection Inject 1 mL (1,000 mcg total) into the muscle every 30 (thirty) days for 12 doses. 06/29/21 05/26/22 Yes Creig Hines, MD  Dexlansoprazole 30 MG capsule Take 30 mg by mouth daily.   Yes [provider]  diclofenac Sodium (VOLTAREN) 1 % GEL Apply 2 g topically 4 (four) times daily. 03/12/20  Yes Dartha Lodge, PA-C  diltiazem (CARDIZEM CD) 360 MG 24 hr capsule Take 360 mg by mouth every morning. 12/18/18  Yes [provider]  ELIDEL 1 % cream Apply  topically to areas on face, arms, and abdomenm 06/09/21  Yes Willeen Niece, MD  escitalopram (LEXAPRO) 20 MG tablet Take 20 mg by mouth daily.   Yes [provider]  fluticasone (FLONASE) 50 MCG/ACT nasal spray Place 2 sprays into both nostrils daily as needed for allergies. 07/16/20  Yes Glenford Bayley, NP  furosemide (LASIX) 20 MG tablet Take 40 mg by mouth daily.   Yes [provider]  gabapentin (NEURONTIN) 800 MG tablet Take 800 mg by mouth 3 (three) times daily.   Yes [provider]  hydrocortisone 2.5 % cream Apply to eczema on face 1-2 times a day as needed until rash improved. 03/15/21  Yes Willeen Niece, MD  hydroxypropyl methylcellulose / hypromellose (ISOPTO TEARS / GONIOVISC) 2.5 % ophthalmic solution Place 1 drop into both eyes 3 (three) times daily as needed for dry eyes.   Yes [provider]  isosorbide mononitrate (IMDUR) 30 MG 24 hr tablet Take 30 mg by mouth daily. 03/08/20  Yes [provider]  ketoconazole (NIZORAL) 2 % cream Qd to bid aa itchy rash face, scalp, under breast until clear, then prn flares 04/07/21  Yes Willeen Niece, MD  ketoconazole (NIZORAL) 2 % shampoo Wash scalp 1-2 times a week, let sit 5 minutes and rinse out 04/07/21  Yes Willeen Niece, MD  lactulose [facility]) 10 GM/15ML solution Take 30 g by mouth daily as needed for mild constipation. 08/28/18  Yes [provider]  lamoTRIgine (LAMICTAL) 200 MG tablet Take 1 tablet by mouth 2 (two) times a day. 05/20/19  Yes [provider]  linaclotide (LINZESS) 290 MCG CAPS capsule Take 290 mcg by mouth daily before breakfast.   Yes [provider]  Lurasidone HCl (LATUDA) 60 MG TABS Take by mouth.   Yes [provider]  mometasone (ELOCON) 0.1 % cream Apply to more severe areas eczema 1-2 times as directed. Avoid face, groin, underarms. 03/15/21  Yes Willeen Niece, MD  montelukast (SINGULAIR) 10 MG tablet Take 1 tablet (10 mg total) by mouth  at bedtime. 07/16/20  Yes Glenford Bayley, NP  Potassium Chloride ER 20 MEQ TBCR Take 20 mEq by mouth 2 (two) times daily.    Yes [provider]  propranolol (INDERAL) 80 MG tablet Take 80 mg by mouth 3 (three) times daily.   Yes [provider]  rivaroxaban (XARELTO) 20 MG TABS tablet Take 1 tablet (20 mg total) by mouth every morning. 06/29/21  Yes Creig Hines, MD  rosuvastatin (CRESTOR) 20 MG tablet Take 20 mg by mouth daily.   Yes [provider]  SPIRIVA RESPIMAT 1.25 MCG/ACT AERS INHALE 2 PUFFS INTO THE LUNGS DAILY. 05/13/21  Yes Glenford Bayley, NP  SYRINGE/NEEDLE, DISP, 1 ML (B-D 1CC SLIP TIP SYR 25GX5/8") 25G X 5/8" 1 ML MISC Inject 1 mL into the muscle once a week. 02/28/21  Yes Corcoran, Ferdie Ping, MD  Topiramate ER (TROKENDI XR) 200 MG CP24 Take 1 capsule by mouth daily.    Yes [provider]  tretinoin (RETIN-A) 0.05 % cream Apply a pea-sized amount to face every night as tolerated for acne. 03/15/21  Yes Willeen Niece, MD  dicyclomine (BENTYL) 10 MG capsule Take 1 capsule (10 mg total) by mouth every 6 (six) hours as needed for up to 7 days for spasms (abdominal pain). 04/18/20 08/03/20  Sharman Cheek, MD   Review of Systems  Constitutional:  Positive for fatigue (easily). Negative for appetite change.  HENT:  Negative for congestion, postnasal drip and sore throat.   Eyes: Negative.   Respiratory:  Positive for chest tightness and shortness of breath. Negative for cough.   Cardiovascular:  Positive for leg swelling ("better"). Negative for chest pain and palpitations.  Gastrointestinal:  Negative for abdominal distention and abdominal pain.  Endocrine: Negative.   Genitourinary: Negative.   Musculoskeletal:  Positive for arthralgias (right knee) and back pain (due to bulging disc).  Skin: Negative.   Allergic/Immunologic: Negative.   Neurological:  Positive for light-headedness. Negative for dizziness and headaches.  Hematological:   Negative for adenopathy. Does not bruise/bleed easily.  Psychiatric/Behavioral:  Positive for dysphoric mood and sleep disturbance (fitful sleeping; wearing CPAP at night). The patient is not nervous/anxious.    Vitals:   08/18/21 1346  BP: 117/67  Pulse: 83  Resp: 20  SpO2: 99%  Weight: (!) 372 lb 2 oz (168.8 kg)  Height: 5\' 10"  (1.778 m)   Wt Readings from Last 3 Encounters:  08/18/21 (!) 372 lb  2 oz (168.8 kg)  07/22/21 (!) 369 lb (167.4 kg)  06/29/21 (!) 369 lb (167.4 kg)   Lab Results  Component Value Date   CREATININE 0.52 02/24/2021   CREATININE 0.63 02/03/2021   CREATININE 0.74 08/03/2020   Objective:    Physical Exam Vitals and nursing note reviewed.  Constitutional:      Appearance: She is well-developed.  HENT:     Head: Normocephalic and atraumatic.  Neck:     Vascular: No JVD.  Cardiovascular:     Rate and Rhythm: Normal rate and regular rhythm.  Pulmonary:     Effort: Pulmonary effort is normal. No respiratory distress.     Breath sounds: No wheezing or rales.  Abdominal:     General: There is no distension.     Palpations: Abdomen is soft.  Musculoskeletal:        General: No tenderness.     Cervical back: Normal range of motion and neck supple.     Right lower leg: No tenderness. Edema (1+ pitting) present.     Left lower leg: No tenderness. Edema (1+ pitting) present.  Skin:    General: Skin is warm and dry.  Neurological:     Mental Status: She is alert and oriented to person, place, and time.  Psychiatric:        Behavior: Behavior normal.      Assessment & Plan:   1: Chronic heart failure with preserved ejection fraction- - NYHA class III - euvolemic today - not weighing daily but does have scales; reminded to call for an overnight weight gain of >2 pounds or a weekly weight gain of >5 pounds - not adding salt and occasionally reads food labels; reviewed the importance of closely following a 2000mg  sodium diet  - saw cardiology  (Agbor-Etang) 07/22/21 & then [facility] cardiology on 07/27/21; says that she's looking for someone that will listen to her and review here chart - due for echo with [facility] in a few weeks - was released from cardiology Khan's office due to numerious NS - drinking ~ 64 ounces of fluid daily - cardiac rehab referral made - BNP 03/12/20 was 18.0  2: HTN- - BP - follows with PCP (Aycock) at 03/14/20  - BMP 02/24/21 reviewed and showed sodium 138, potassium 3.4, creatinine 0.52 and GFR >60  3: Obstructive sleep apnea- - wearing CPAP nightly and feels like she's sleeping well - had video visit with pulmonology 02/26/21) 07/07/21  4: Lymphedema- - stage 2 - wrapping her legs or wearing compression socks although not daily; encouraged to do this daily - instructed to elevate her legs when sitting for long periods of time - limited in her ability to exercise due to shortness of breath - consider compression boots if edema persists   Patient did not bring her medications nor a list. Each medication was verbally reviewed with the patient and she was encouraged to bring the bottles to every visit to confirm accuracy of list.   Return in 1 month or sooner for any questions/problems before then.

## 2021-08-25 DIAGNOSIS — Z139 Encounter for screening, unspecified: Secondary | ICD-10-CM

## 2021-08-25 LAB — GLUCOSE, POCT (MANUAL RESULT ENTRY): POC Glucose: 135 mg/dl — AB (ref 70–99)

## 2021-08-25 NOTE — Congregational Nurse Program (Signed)
  Dept: [phone]   Congregational Nurse Program Note  Date of Encounter: 08/25/2021  Past Medical History: Past Medical History:  Diagnosis Date   Anxiety    Asthma    Bipolar disorder (HCC)    Chest pain    and angina   CHF (congestive heart failure) (HCC)    Depression    Fibromyalgia    Hypertension    Left lumbar radiculopathy since 08/2014   secondary to work accident   Obesity    Pre-diabetes    Pulmonary embolus (HCC)    Seizures (HCC)    secondary to anxiety  last seizure 01/25/18   SVT (supraventricular tachycardia) (HCC)    with hx of syncope; managed by [facility]    Encounter Details:  CNP Questionnaire - 08/25/21 0852       Questionnaire   Do you give verbal consent to treat you today? 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 Client requests BP and glucose screening at nurse only clinic at food bank. Agrees to visit. Confidentiality reviewed. PCP is Dr. Garth Schlatter at [facility]. BP 135/89 with large cuff; nonfasting glucose 135. Co re: need for recheck. Co re decreasing sweets, adding protein when eating carbs; replacing at least 1 sweet drink/ day with water; reading labels to assure serving size. Verbalizes understanding. Agrees to rescreen in 1 week. Rhermann, RN

## 2021-08-30 ENCOUNTER — Other Ambulatory Visit: Payer: Medicare Other

## 2021-08-31 ENCOUNTER — Ambulatory Visit: Payer: Medicare Other

## 2021-08-31 ENCOUNTER — Ambulatory Visit: Payer: Medicare Other | Admitting: Oncology

## 2021-09-05 ENCOUNTER — Ambulatory Visit (INDEPENDENT_AMBULATORY_CARE_PROVIDER_SITE_OTHER): Payer: Medicare Other | Admitting: Psychology

## 2021-09-05 DIAGNOSIS — F603 Borderline personality disorder: Secondary | ICD-10-CM | POA: Diagnosis not present

## 2021-09-05 DIAGNOSIS — F909 Attention-deficit hyperactivity disorder, unspecified type: Secondary | ICD-10-CM | POA: Diagnosis not present

## 2021-09-05 DIAGNOSIS — F319 Bipolar disorder, unspecified: Secondary | ICD-10-CM

## 2021-09-05 DIAGNOSIS — F4310 Post-traumatic stress disorder, unspecified: Secondary | ICD-10-CM | POA: Diagnosis not present

## 2021-09-05 DIAGNOSIS — F419 Anxiety disorder, unspecified: Secondary | ICD-10-CM

## 2021-09-06 ENCOUNTER — Encounter: Payer: Self-pay | Admitting: Hematology and Oncology

## 2021-09-13 ENCOUNTER — Ambulatory Visit (INDEPENDENT_AMBULATORY_CARE_PROVIDER_SITE_OTHER): Payer: Medicare Other | Admitting: Dermatology

## 2021-09-13 ENCOUNTER — Other Ambulatory Visit: Payer: Self-pay

## 2021-09-13 DIAGNOSIS — L304 Erythema intertrigo: Secondary | ICD-10-CM | POA: Diagnosis not present

## 2021-09-13 DIAGNOSIS — L700 Acne vulgaris: Secondary | ICD-10-CM

## 2021-09-13 DIAGNOSIS — L219 Seborrheic dermatitis, unspecified: Secondary | ICD-10-CM

## 2021-09-13 DIAGNOSIS — L2089 Other atopic dermatitis: Secondary | ICD-10-CM

## 2021-09-13 DIAGNOSIS — L209 Atopic dermatitis, unspecified: Secondary | ICD-10-CM

## 2021-09-13 MED ORDER — KETOCONAZOLE 2 % EX SHAM: MEDICATED_SHAMPOO | CUTANEOUS | 11 refills | Status: DC

## 2021-09-13 MED ORDER — TRETINOIN 0.025 % EX CREA: TOPICAL_CREAM | CUTANEOUS | 5 refills | Status: DC

## 2021-09-13 MED ORDER — MOMETASONE FUROATE 0.1 % EX CREA: TOPICAL_CREAM | CUTANEOUS | 3 refills | Status: DC

## 2021-09-13 MED ORDER — PIMECROLIMUS 1 % EX CREA: TOPICAL_CREAM | CUTANEOUS | 5 refills | Status: DC

## 2021-09-13 MED ORDER — HYDROCORTISONE 2.5 % EX CREA: TOPICAL_CREAM | CUTANEOUS | 5 refills | Status: DC

## 2021-09-13 MED ORDER — KETOCONAZOLE 2 % EX CREA: TOPICAL_CREAM | CUTANEOUS | 11 refills | Status: DC

## 2021-09-13 NOTE — Progress Notes (Signed)
Follow-Up Visit   Subjective  Andrea Miller is a 35 y.o. female who presents for the following: Follow-up.  Needs rfs of meds.  Patient presents for 6 month follow-up. Acne is controlled using tretinoin 0.05% cream nightly. Intertrigo controlled with ketoconazole 2% cream as needed. Seborrheic dermatitis of the face and scalp controlled with ketoconazole 2% shampoo once weekly, ketoconazole cream and Elidel cream to the face. Atopic Dermatitis of the face has worsened recently due to a new Cetaphil Eczema wash she used. She uses hydrocortisone 2.5% cream, Elidel Cream, and mometasone cream as needed.    The following portions of the chart were reviewed this encounter and updated as appropriate:       Review of Systems:  No other skin or systemic complaints except as noted in HPI or Assessment and Plan.  Objective  Well appearing patient in no apparent distress; mood and affect are within normal limits.  A focused examination was performed including face, trunk, extremeties. Relevant physical exam findings are noted in the Assessment and Plan.  face Few resolving inflammatory papules of the left jaw, right cheek.  Scalp Mild scaling   inframammary Clear today.   face, body Hyperpigmented scaly patches on the cheeks, lower back.   Assessment & Plan  Acne vulgaris face  Due to recent facial eczema flare, Decrease strength Retin-A 0.0025% cream Apply to face qhs as tolerated dsp 45g 5Rf.  Topical retinoid medications like tretinoin/Retin-A, adapalene/Differin, tazarotene/Fabior, and Epiduo/Epiduo Forte can cause dryness and irritation when first started. Only apply a pea-sized amount to the entire affected area. Avoid applying it around the eyes, edges of mouth and creases at the nose. If you experience irritation, use a good moisturizer first and/or apply the medicine less often. If you are doing well with the medicine, you can increase how often you use it until you are  applying every night. Be careful with sun protection while using this medication as it can make you sensitive to the sun. This medicine should not be used by pregnant women.    tretinoin (RETIN-A) 0.025 % cream - face Apply to face nightly as tolerated.  Seborrheic dermatitis Scalp  Seborrheic Dermatitis  -  is a chronic persistent rash characterized by pinkness and scaling most commonly of the mid face but also can occur on the scalp (dandruff), ears; mid chest, mid back and groin.  It tends to be exacerbated by stress and cooler weather.  People who have neurologic disease may experience new onset or exacerbation of existing seborrheic dermatitis.  The condition is not curable but treatable and can be controlled.  Controlled.   Continue Ketoconazole 2% shampoo Apply to scalp and let sit several minutes before rinsing 1 yr rf.   Continue Ketoconazole 2% cream Apply qd/bid to AAs face 60g 1 yr rf.   Continue Elidel Cream Apply to Aas qd/bid prn 100g 5Rf.    Intertrigo inframammary  Improved Continue ketoconazole 2% cream qd/bid prn flares.  Intertrigo is a chronic recurrent rash that occurs in skin fold areas that may be associated with friction; heat; moisture; yeast; fungus; and bacteria.  It is exacerbated by increased movement / activity; sweating; and higher atmospheric temperature.   Other atopic dermatitis face, body  Recent flare on face, overall improved.  Atopic dermatitis (eczema) is a chronic, relapsing, pruritic condition that can significantly affect quality of life. It is often associated with allergic rhinitis and/or asthma and can require treatment with topical medications, phototherapy, or in severe cases  a biologic medication called Dupixent in children and adults.   Continue Hydrocortisone 2.5% Cream Apply to AA face qd/bid prn Continue Elidel Cream Apply to AAs face/body qd/bid prn.  Continue mometasone cream Apply qd/bid more severe areas body prn flares.  May use up to 1 week on face. Avoid face, groin, axilla.  Recommend mild soap and moisturizing cream 1-2 times daily.  Samples given  Atopic dermatitis, unspecified type  Related Medications hydrocortisone 2.5 % cream Apply to eczema on face 1-2 times a day as needed until rash improved.  mometasone (ELOCON) 0.1 % cream Apply to more severe areas eczema 1-2 times as directed. May use on face up to 1 week at a time. Avoid groin, underarms.  Erythema intertrigo  Related Medications ketoconazole (NIZORAL) 2 % cream Apply 1-2 times a day to itchy rash face, scalp, under breast until clear, then prn flares.  Return in about 6 months (around 03/14/2022) for AD, intertrigo, SD, Acne.  ICherlyn Labella, CMA, am acting as scribe for Willeen Niece, MD . Documentation: I have reviewed the above documentation for accuracy and completeness, and I agree with the above.  Willeen Niece MD

## 2021-09-13 NOTE — Patient Instructions (Addendum)
Gentle Skin Care Guide  1. Bathe no more than once a day.  2. Avoid bathing in hot water  3. Use a mild soap like Dove, Vanicream, Cetaphil, CeraVe. Can use Lever 2000 or Cetaphil antibacterial soap  4. Use soap only where you need it. On most days, use it under your arms, between your legs, and on your feet. Let the water rinse other areas unless visibly dirty.  5. When you get out of the bath/shower, use a towel to gently blot your skin dry, don't rub it.  6. While your skin is still a little damp, apply a moisturizing cream such as Vanicream, CeraVe, Cetaphil, Eucerin, Sarna lotion or plain Vaseline Jelly. For hands apply Neutrogena [place] Hand Cream or Excipial Hand Cream.  7. Reapply moisturizer any time you start to itch or feel dry.  8. Sometimes using free and clear laundry detergents can be helpful. Fabric softener sheets should be avoided. Downy Free & Gentle liquid, or any liquid fabric softener that is free of dyes and perfumes, it acceptable to use  9. If your doctor has given you prescription creams you may apply moisturizers over them    Topical steroids (such as triamcinolone, fluocinolone, fluocinonide, mometasone, clobetasol, halobetasol, betamethasone, hydrocortisone) can cause thinning and lightening of the skin if they are used for too long in the same area. Your physician has selected the right strength medicine for your problem and area affected on the body. Please use your medication only as directed by your physician to prevent side effects.   Topical retinoid medications like tretinoin/Retin-A, adapalene/Differin, tazarotene/Fabior, and Epiduo/Epiduo Forte can cause dryness and irritation when first started. Only apply a pea-sized amount to the entire affected area. Avoid applying it around the eyes, edges of mouth and creases at the nose. If you experience irritation, use a good moisturizer first and/or apply the medicine less often. If you are doing well with the  medicine, you can increase how often you use it until you are applying every night. Be careful with sun protection while using this medication as it can make you sensitive to the sun. This medicine should not be used by pregnant women.     If you have any questions or concerns for your doctor, please call our main line at [phone] and press option 4 to reach your doctor's medical assistant. If no one answers, please leave a voicemail as directed and we will return your call as soon as possible. Messages left after 4 pm will be answered the following business day.   You may also send [place] a message via MyChart. We typically respond to MyChart messages within 1-2 business days.  For prescription refills, please ask your pharmacy to contact our office. Our fax number is [phone].  If you have an urgent issue when the clinic is closed that cannot wait until the next business day, you can page your doctor at the number below.    Please note that while we do our best to be available for urgent issues outside of office hours, we are not available 24/7.   If you have an urgent issue and are unable to reach [place], you may choose to seek medical care at your doctor's office, retail clinic, urgent care center, or emergency room.  If you have a medical emergency, please immediately call 911 or go to the emergency department.  Pager Numbers  - Dr. Gwen Pounds: [phone]  - Dr. Neale Burly: [phone]  - Dr. Roseanne Reno: [phone]  In the event  of inclement weather, please call our main line at [phone] for an update on the status of any delays or closures.  Dermatology Medication Tips: Please keep the boxes that topical medications come in in order to help keep track of the instructions about where and how to use these. Pharmacies typically print the medication instructions only on the boxes and not directly on the medication tubes.   If your medication is too expensive, please contact our office  at [phone] option 4 or send [place] a message through MyChart.   We are unable to tell what your co-pay for medications will be in advance as this is different depending on your insurance coverage. However, we may be able to find a substitute medication at lower cost or fill out paperwork to get insurance to cover a needed medication.   If a prior authorization is required to get your medication covered by your insurance company, please allow [place] 1-2 business days to complete this process.  Drug prices often vary depending on where the prescription is filled and some pharmacies may offer cheaper prices.  The website www.goodrx.com contains coupons for medications through different pharmacies. The prices here do not account for what the cost may be with help from insurance (it may be cheaper with your insurance), but the website can give you the price if you did not use any insurance.  - You can print the associated coupon and take it with your prescription to the pharmacy.  - You may also stop by our office during regular business hours and pick up a GoodRx coupon card.  - If you need your prescription sent electronically to a different pharmacy, notify our office through [facility] or by phone at [phone] option 4.

## 2021-09-14 NOTE — Progress Notes (Deleted)
Subjective:    Patient ID: Andrea Miller, female    DOB: 04-May-1986, 35 y.o.   MRN: [id number]  HPI  Andrea Miller is a 35 y/o female with a history of asthma, HTN, SVT, obesity, bipolar, anxiety, depression, fibromyalgia, seizures, bulging disc and chronic heart failure.   Echo report from 09/16/18 reviewed and showed an EF of 55%. Echo report from 07/04/18 reviewed and showed an EF of 73%. Echo report from 04/15/17 reviewed and showed an EF of 60-65%.  Was in the ED 05/31/21 due to right knee pain. Evaluated and released.   She presents today for a follow-up visit with a chief complaint of   Past Medical History:  Diagnosis Date   Anxiety    Asthma    Bipolar disorder (HCC)    Chest pain    and angina   CHF (congestive heart failure) (HCC)    Depression    Fibromyalgia    Hypertension    Left lumbar radiculopathy since 08/2014   secondary to work accident   Obesity    Pre-diabetes    Pulmonary embolus (HCC)    Seizures (HCC)    secondary to anxiety  last seizure 01/25/18   SVT (supraventricular tachycardia) (HCC)    with hx of syncope; managed by [facility]   Past Surgical History:  Procedure Laterality Date   CHOLECYSTECTOMY N/A 02/22/2018   Procedure: LAPAROSCOPIC CHOLECYSTECTOMY;  Surgeon: Ancil Linsey, MD;  Location: [facility] ORS;  Service: General;  Laterality: N/A;   TONSILLECTOMY AND ADENOIDECTOMY N/A 09/05/2016   Procedure: TONSILLECTOMY AND ADENOIDECTOMY;  Surgeon: Linus Salmons, MD;  Location: [facility] ORS;  Service: ENT;  Laterality: N/A;   Family History  Problem Relation Age of Onset   Diabetes Mother    Hypertension Mother    Asthma Mother    Gallstones Mother    Diabetes Father    Hypertension Father    Gallstones Father    Bipolar disorder Sister    COPD Brother    Schizophrenia Brother    COPD Brother    Hypertension Brother    Social History   Tobacco Use   Smoking status: Never   Smokeless tobacco: Never  Substance Use Topics   Alcohol use:  Not Currently   Allergies  Allergen Reactions   Cortizone-10 [Hydrocortisone] Shortness Of Breath and Swelling   Garlic Anaphylaxis   Prednisone Swelling    Tongue swelling   Contrast Media [Iodinated Diagnostic Agents]     Pt reports that "every time" she receives contrast she "can't breathe" for a short time. At time of arrival to room after CT scan, she is breathing normally and VS WNL.  Pt states that she thought not breathing after contrast for a little while was normal. When asked if allergic or has any reaction to IV contrast patient stated no. LP    Corticosteroids Palpitations   Penicillins Other (See Comments) and Rash    Has patient had a PCN reaction causing immediate rash, facial/tongue/throat swelling, SOB or lightheadedness with hypotension:Yes Has patient had a PCN reaction causing severe rash involving mucus membranes or skin necrosis:No Has patient had a PCN reaction that required hospitalization:No Has patient had a PCN reaction occurring within the last 10 years:Yes. If all of the above answers are "NO", then may proceed with Cephalosporin use.  Has patient had a PCN reaction causing immediate rash, facial/tongue/throat swelling, SOB or lightheadedness with hypotension:Yes Has patient had a PCN reaction causing severe rash involving mucus membranes or skin  necrosis:No Has patient had a PCN reaction that required hospitalization:No Has patient had a PCN reaction occurring within the last 10 years:Yes. If all of the above answers are "NO", then may proceed with Cephalosporin use.   Patient does not remember Patient does not remember    Review of Systems  Constitutional:  Positive for fatigue (easily). Negative for appetite change.  HENT:  Negative for congestion, postnasal drip and sore throat.   Eyes: Negative.   Respiratory:  Positive for chest tightness and shortness of breath. Negative for cough.   Cardiovascular:  Positive for leg swelling ("better").  Negative for chest pain and palpitations.  Gastrointestinal:  Negative for abdominal distention and abdominal pain.  Endocrine: Negative.   Genitourinary: Negative.   Musculoskeletal:  Positive for arthralgias (right knee) and back pain (due to bulging disc).  Skin: Negative.   Allergic/Immunologic: Negative.   Neurological:  Positive for light-headedness. Negative for dizziness and headaches.  Hematological:  Negative for adenopathy. Does not bruise/bleed easily.  Psychiatric/Behavioral:  Positive for dysphoric mood and sleep disturbance (fitful sleeping; wearing CPAP at night). The patient is not nervous/anxious.      Objective:    Physical Exam Vitals and nursing note reviewed.  Constitutional:      Appearance: She is well-developed.  HENT:     Head: Normocephalic and atraumatic.  Neck:     Vascular: No JVD.  Cardiovascular:     Rate and Rhythm: Normal rate and regular rhythm.  Pulmonary:     Effort: Pulmonary effort is normal. No respiratory distress.     Breath sounds: No wheezing or rales.  Abdominal:     General: There is no distension.     Palpations: Abdomen is soft.  Musculoskeletal:        General: No tenderness.     Cervical back: Normal range of motion and neck supple.     Right lower leg: No tenderness. Edema (1+ pitting) present.     Left lower leg: No tenderness. Edema (1+ pitting) present.  Skin:    General: Skin is warm and dry.  Neurological:     Mental Status: She is alert and oriented to person, place, and time.  Psychiatric:        Behavior: Behavior normal.      Assessment & Plan:   1: Chronic heart failure with preserved ejection fraction- - NYHA class III - euvolemic today - not weighing daily but does have scales; reminded to call for an overnight weight gain of >2 pounds or a weekly weight gain of >5 pounds - weight 372.2 from last visit here 1 month ago - not adding salt and occasionally reads food labels; reviewed the importance of  closely following a 2000mg  sodium diet  - saw cardiology (Agbor-Etang) 07/22/21 & then [facility] cardiology on 07/27/21; says that she's looking for someone that will listen to her and review here chart - due for echo with [facility] in a few weeks - was released from cardiology [facility]'s office due to numerious NS - drinking ~ 64 ounces of fluid daily - cardiac rehab referral made - BNP 03/12/20 was 18.0  2: HTN- - BP - follows with PCP (Aycock) at 03/14/20  - BMP 02/24/21 reviewed and showed sodium 138, potassium 3.4, creatinine 0.52 and GFR >60  3: Obstructive sleep apnea- - wearing CPAP nightly and feels like she's sleeping well - had video visit with pulmonology 02/26/21) 07/07/21  4: Lymphedema- - stage 2 - wrapping her legs or wearing compression  socks although not daily; encouraged to do this daily - instructed to elevate her legs when sitting for long periods of time - limited in her ability to exercise due to shortness of breath - consider compression boots if edema persists   Patient did not bring her medications nor a list. Each medication was verbally reviewed with the patient and she was encouraged to bring the bottles to every visit to confirm accuracy of list.

## 2021-09-15 ENCOUNTER — Telehealth: Payer: Self-pay | Admitting: Family

## 2021-09-15 ENCOUNTER — Ambulatory Visit: Payer: Medicare Other | Admitting: Family

## 2021-09-15 NOTE — Telephone Encounter (Signed)
Even though patient's mother confirmed appointment yesterday, she did not show for her [facility] appointment today, 09/15/21. Will attempt to reschedule.

## 2021-09-19 ENCOUNTER — Ambulatory Visit
Admission: EM | Admit: 2021-09-19 | Discharge: 2021-09-19 | Disposition: A | Discharge status: Home / Self Care | Payer: Medicare Other | Attending: Physician Assistant | Admitting: Physician Assistant

## 2021-09-19 ENCOUNTER — Other Ambulatory Visit: Payer: Self-pay

## 2021-09-19 ENCOUNTER — Ambulatory Visit (INDEPENDENT_AMBULATORY_CARE_PROVIDER_SITE_OTHER): Payer: Medicare Other

## 2021-09-19 DIAGNOSIS — M25572 Pain in left ankle and joints of left foot: Secondary | ICD-10-CM

## 2021-09-19 DIAGNOSIS — J069 Acute upper respiratory infection, unspecified: Secondary | ICD-10-CM | POA: Insufficient documentation

## 2021-09-19 DIAGNOSIS — Z888 Allergy status to other drugs, medicaments and biological substances status: Secondary | ICD-10-CM | POA: Diagnosis not present

## 2021-09-19 DIAGNOSIS — W010XXA Fall on same level from slipping, tripping and stumbling without subsequent striking against object, initial encounter: Secondary | ICD-10-CM | POA: Insufficient documentation

## 2021-09-19 DIAGNOSIS — M79672 Pain in left foot: Secondary | ICD-10-CM | POA: Diagnosis not present

## 2021-09-19 DIAGNOSIS — R051 Acute cough: Secondary | ICD-10-CM | POA: Diagnosis present

## 2021-09-19 DIAGNOSIS — W19XXXA Unspecified fall, initial encounter: Secondary | ICD-10-CM | POA: Diagnosis not present

## 2021-09-19 DIAGNOSIS — S93402A Sprain of unspecified ligament of left ankle, initial encounter: Secondary | ICD-10-CM | POA: Insufficient documentation

## 2021-09-19 DIAGNOSIS — Z20822 Contact with and (suspected) exposure to covid-19: Secondary | ICD-10-CM | POA: Insufficient documentation

## 2021-09-19 DIAGNOSIS — Z880 Allergy status to penicillin: Secondary | ICD-10-CM | POA: Insufficient documentation

## 2021-09-19 MED ORDER — IPRATROPIUM BROMIDE 0.06 % NA SOLN
2.0000 | Freq: Four times a day (QID) | NASAL | 0 refills | Status: DC
Start: 2021-09-19 — End: 2024-02-07

## 2021-09-19 MED ORDER — PSEUDOEPH-BROMPHEN-DM 30-2-10 MG/5ML PO SYRP: 10.0000 mL | ORAL_SOLUTION | Freq: Four times a day (QID) | ORAL | 0 refills | Status: AC | PRN

## 2021-09-19 NOTE — ED Provider Notes (Signed)
[facility]    CSN: [id number] Arrival date & time: 09/19/21  1422      History   Chief Complaint Chief Complaint  Patient presents with   Ankle Pain    Left ankle pain    HPI Andrea Miller is a 35 y.o. female presenting for multiple complaints.  First, patient states that she has had any nasal congestion, sinus pressure, ear pain and pressure as well as cough for the past 2 to 3 days.  Patient reports similar symptoms in her mother and uncle.  She has not had any fevers but has felt feverish.  She has had chills and them also admits to body aches.  No shortness of breath, vomiting or diarrhea.  Patient has taken NyQuil and Tylenol over-the-counter.  Reports concerns that she may need antibiotics.  No known COVID exposure but reports that her family members have not been tested and she has not tested herself either.  Additionally, patient reports left ankle and foot pain.  Patient says she was getting ready to come to urgent care and she excellently slipped and twisted her ankle.  She says her pain is 15 out of 10.  She has not applied ice or taken anything for pain relief.  She admits to difficulty putting weight on the foot/ankle due to significant pain.  No numbness or tingling or weakness reported.  No mention of previous fractures.  No other injuries or complaints.  HPI  Past Medical History:  Diagnosis Date   Anxiety    Asthma    Bipolar disorder (HCC)    Chest pain    and angina   CHF (congestive heart failure) (HCC)    Depression    Fibromyalgia    Hypertension    Left lumbar radiculopathy since 08/2014   secondary to work accident   Obesity    Pre-diabetes    Pulmonary embolus (HCC)    Seizures (HCC)    secondary to anxiety  last seizure 01/25/18   SVT (supraventricular tachycardia) (HCC)    with hx of syncope; managed by [facility]    Patient Active Problem List   Diagnosis Date Noted   Chronic anticoagulation 05/07/2019   Iron deficiency  anemia due to chronic blood loss 10/15/2018   B12 deficiency 10/13/2018   Symptomatic anemia 10/11/2018   Multiple subsegmental pulmonary emboli without acute cor pulmonale (HCC) 09/15/2018   Morbid obesity with BMI of 50.0-59.9, adult (HCC) 09/15/2018   Chronic diastolic heart failure (HCC) 07/01/2018   Lymphedema 07/01/2018   Chronic cholecystitis    Edema 02/13/2018   Diabetes mellitus type 2, uncomplicated (HCC) 02/13/2018   Hypertension 11/23/2017   Asthma exacerbation 04/14/2017   Asthma without status asthmaticus 01/16/2017   Bipolar affective (HCC) 01/16/2017   Coronary artery disease 01/16/2017   Syncope 12/12/2016   Fibromyalgia 04/27/2014   OSA (obstructive sleep apnea) 04/27/2014   Seizure-like activity (HCC) 04/27/2014   Migraine without status migrainosus, not intractable 04/27/2014   Obesity 04/15/2014   Sleep disorder 04/15/2014   Neck pain 04/15/2014   Headache 04/15/2014   Restless legs syndrome 04/15/2014   Supraventricular tachycardia (HCC) 07/13/2009    Past Surgical History:  Procedure Laterality Date   CHOLECYSTECTOMY N/A 02/22/2018   Procedure: LAPAROSCOPIC CHOLECYSTECTOMY;  Surgeon: Ancil Linsey, MD;  Location: [facility] OR[facility];  Service: General;  Laterality: N/A;   TONSILLECTOMY AND ADENOIDECTOMY N/A 09/05/2016   Procedure: TONSILLECTOMY AND ADENOIDECTOMY;  Surgeon: Linus Salmons, MD;  Location: [facility] OR[facility];  Service: ENT;  Laterality: N/A;    OB History     Gravida  0   Para  0   Term  0   Preterm  0   AB  0   Living  0      SAB  0   IAB  0   Ectopic  0   Multiple  0   Live Births               Home Medications    Prior to Admission medications   Medication Sig Start Date End Date Taking? Authorizing Provider  Adapalene 0.3 % gel Apply 1 application topically at bedtime. 10/20/19  Yes [provider]  albuterol (PROVENTIL) (2.5 MG/3ML) 0.083% nebulizer solution Take 3 mLs (2.5 mg total) by nebulization every 4  (four) hours as needed for wheezing or shortness of breath. 09/23/19  Yes Salena Saner, MD  Albuterol Sulfate (PROAIR RESPICLICK) 108 (90 Base) MCG/ACT AEPB Inhale 2 puffs into the lungs every 6 (six) hours as needed. 06/27/21  Yes Glenford Bayley, NP  amphetamine-dextroamphetamine (ADDERALL XR) 15 MG 24 hr capsule TAKE 1 CAPSULE BY MOUTH EACH MORNING 12/15/20  Yes [provider]  brompheniramine-pseudoephedrine-DM 30-2-10 MG/5ML syrup Take 10 mLs by mouth 4 (four) times daily as needed for up to 7 days. 09/19/21 09/26/21 Yes Shirlee Latch, PA-C  budesonide-formoterol (SYMBICORT) 160-4.5 MCG/ACT inhaler Inhale 2 puffs into the lungs 2 (two) times daily. 07/16/20  Yes Glenford Bayley, NP  budesonide-formoterol [facility]) 160-4.5 MCG/ACT inhaler Inhale 2 puffs into the lungs in the morning and at bedtime. 07/07/21  Yes Glenford Bayley, NP  buprenorphine Laver[facility] Guise) 15 MCG/HR Place 1 patch onto the skin once a week. 10/01/20  Yes [provider]  Calcium-Magnesium 500-250 MG TABS Take 1 tablet by mouth 2 (two) times daily.    Yes [provider]  cyanocobalamin (,VITAMIN B-12,) 1000 MCG/ML injection Inject 1 mL (1,000 mcg total) into the muscle every 30 (thirty) days for 12 doses. 06/29/21 05/26/22 Yes Creig Hines, MD  Dexlansoprazole 30 MG capsule Take 30 mg by mouth daily.   Yes [provider]  diclofenac Sodium (VOLTAREN) 1 % GEL Apply 2 g topically 4 (four) times daily. 03/12/20  Yes Dartha Lodge, PA-C  diltiazem (CARDIZEM CD) 360 MG 24 hr capsule Take 360 mg by mouth every morning. 12/18/18  Yes [provider]  escitalopram (LEXAPRO) 20 MG tablet Take 20 mg by mouth daily.   Yes [provider]  fluticasone (FLONASE) 50 MCG/ACT nasal spray Place 2 sprays into both nostrils daily as needed for allergies. 07/16/20  Yes Glenford Bayley, NP  furosemide (LASIX) 20 MG tablet Take 40 mg by mouth daily.   Yes [provider]   gabapentin (NEURONTIN) 800 MG tablet Take 800 mg by mouth 3 (three) times daily.   Yes [provider]  hydrocortisone 2.5 % cream Apply to eczema on face 1-2 times a day as needed until rash improved. 09/13/21  Yes Willeen Niece, MD  hydroxypropyl methylcellulose / hypromellose (ISOPTO TEARS / GONIOVISC) 2.5 % ophthalmic solution Place 1 drop into both eyes 3 (three) times daily as needed for dry eyes.   Yes [provider]  ipratropium (ATROVENT) 0.06 % nasal spray Place 2 sprays into both nostrils 4 (four) times daily. 09/19/21  Yes Eusebio Friendly B, PA-C  isosorbide mononitrate (IMDUR) 30 MG 24 hr tablet Take 30 mg by mouth daily. 03/08/20  Yes [provider]  ketoconazole (NIZORAL) 2 % cream Apply 1-2 times a day to itchy rash face, scalp, under breast until clear, then prn flares. 09/13/21  Yes Willeen Niece, MD  ketoconazole (NIZORAL) 2 % shampoo Wash scalp 1-2 times a week, let sit 5 minutes and rinse out 09/13/21  Yes Willeen Niece, MD  lactulose [facility]) 10 GM/15ML solution Take 30 g by mouth daily as needed for mild constipation. 08/28/18  Yes [provider]  lamoTRIgine (LAMICTAL) 200 MG tablet Take 1 tablet by mouth 2 (two) times a day. 05/20/19  Yes [provider]  linaclotide (LINZESS) 290 MCG CAPS capsule Take 290 mcg by mouth daily before breakfast.   Yes [provider]  Lurasidone HCl (LATUDA) 60 MG TABS Take by mouth.   Yes [provider]  mometasone (ELOCON) 0.1 % cream Apply to more severe areas eczema 1-2 times as directed. May use on face up to 1 week at a time. Avoid groin, underarms. 09/13/21  Yes Willeen Niece, MD  montelukast (SINGULAIR) 10 MG tablet Take 1 tablet (10 mg total) by mouth at bedtime. 07/16/20  Yes Glenford Bayley, NP  pimecrolimus (ELIDEL) 1 % cream Apply topically to areas on face, body, body folds. 09/13/21  Yes Willeen Niece, MD  Potassium Chloride ER 20 MEQ TBCR Take 20 mEq by mouth 2  (two) times daily.    Yes [provider]  propranolol (INDERAL) 80 MG tablet Take 80 mg by mouth 3 (three) times daily.   Yes [provider]  rivaroxaban (XARELTO) 20 MG TABS tablet Take 1 tablet (20 mg total) by mouth every morning. 06/29/21  Yes Creig Hines, MD  rosuvastatin (CRESTOR) 20 MG tablet Take 20 mg by mouth daily.   Yes [provider]  SPIRIVA RESPIMAT 1.25 MCG/ACT AERS INHALE 2 PUFFS INTO THE LUNGS DAILY. 05/13/21  Yes Glenford Bayley, NP  SYRINGE/NEEDLE, DISP, 1 ML (B-D 1CC SLIP TIP SYR 25GX5/8") 25G X 5/8" 1 ML MISC Inject 1 mL into the muscle once a week. 02/28/21  Yes Corcoran, Ferdie Ping, MD  Topiramate ER (TROKENDI XR) 200 MG CP24 Take 1 capsule by mouth daily.    Yes [provider]  tretinoin (RETIN-A) 0.025 % cream Apply to face nightly as tolerated. 09/13/21  Yes Willeen Niece, MD  dicyclomine (BENTYL) 10 MG capsule Take 1 capsule (10 mg total) by mouth every 6 (six) hours as needed for up to 7 days for spasms (abdominal pain). 04/18/20 08/03/20  Sharman Cheek, MD    Family History Family History  Problem Relation Age of Onset   Diabetes Mother    Hypertension Mother    Asthma Mother    Gallstones Mother    Diabetes Father    Hypertension Father    Gallstones Father    Bipolar disorder Sister    COPD Brother    Schizophrenia Brother    COPD Brother    Hypertension Brother     Social History Social History   Tobacco Use   Smoking status: Never   Smokeless tobacco: Never  Vaping Use   Vaping Use: Never used  Substance Use Topics   Alcohol use: Not Currently   Drug use: No     Allergies   Cortizone-10 [hydrocortisone], Garlic, Prednisone, Contrast media [iodinated diagnostic agents], Corticosteroids, and Penicillins   Review of Systems Review of Systems  Constitutional:  Positive for chills and fatigue. Negative for diaphoresis and fever.  HENT:  Positive for congestion, ear pain, rhinorrhea and sinus  pressure. Negative for sinus pain and sore throat.   Respiratory:  Positive for cough. Negative for shortness of breath.   Cardiovascular:  Negative for chest pain.  Gastrointestinal:  Negative for abdominal pain, nausea and vomiting.  Musculoskeletal:  Positive for arthralgias and myalgias. Negative for gait problem and joint swelling.  Skin:  Negative for rash.  Neurological:  Positive for headaches. Negative for weakness.  Hematological:  Negative for adenopathy.    Physical Exam Triage Vital Signs ED Triage Vitals  Enc Vitals Group     BP 09/19/21 1522 (!) 140/93     Pulse Rate 09/19/21 1522 81     Resp 09/19/21 1522 16     Temp 09/19/21 1522 98.7 F (37.1 C)     Temp Source 09/19/21 1522 Oral     SpO2 09/19/21 1522 99 %     Weight 09/19/21 1520 (!) 302 lb (137 kg)     Height 09/19/21 1520 5\' 10"  (1.778 m)     Head Circumference --      Peak Flow --      Pain Score 09/19/21 1520 10     Pain Loc --      Pain Edu? --      Excl. in GC? --    No data found.  Updated Vital Signs BP (!) 140/93 (BP Location: Left Arm)   Pulse 81   Temp 98.7 F (37.1 C) (Oral) Comment: taking nyquil  Resp 16   Ht 5\' 10"  (1.778 m)   Wt (!) 302 lb (137 kg)   LMP 09/12/2021   SpO2 99%   BMI 43.33 kg/m     Physical Exam Vitals and nursing note reviewed.  Constitutional:      General: She is not in acute distress.    Appearance: Normal appearance. She is not ill-appearing or toxic-appearing.  HENT:     Head: Normocephalic and atraumatic.     Right Ear: Tympanic membrane, ear canal and external ear normal.     Left Ear: Tympanic membrane, ear canal and external ear normal.     Nose: Congestion present.     Mouth/Throat:     Mouth: Mucous membranes are moist.     Pharynx: Oropharynx is clear.  Eyes:     General: No scleral icterus.       Right eye: No discharge.        Left eye: No discharge.     Conjunctiva/sclera: Conjunctivae normal.  Cardiovascular:     Rate and Rhythm:  Normal rate and regular rhythm.     Heart sounds: Normal heart sounds.  Pulmonary:     Effort: Pulmonary effort is normal. No respiratory distress.     Breath sounds: Normal breath sounds.  Musculoskeletal:     Cervical back: Neck supple.     Left ankle: Swelling (moderate lateral ankle swelling) present. Tenderness present over the lateral malleolus, ATF ligament and base of 5th metatarsal. Decreased range of motion.     Left foot: Decreased range of motion. Swelling and tenderness (base of 5th metatarsal) present.  Skin:    General: Skin is dry.  Neurological:     General: No focal deficit present.     Mental Status: She is alert. Mental status is at baseline.     Motor: No weakness.     Gait: Gait abnormal.  Psychiatric:        Mood and Affect: Mood normal.        Behavior: Behavior normal.  Thought Content: Thought content normal.     UC Treatments / Results  Labs (all labs ordered are listed, but only abnormal results are displayed) Labs Reviewed  SARS CORONAVIRUS 2 (TAT 6-24 HRS)    EKG   Radiology DG Ankle Complete Left  Result Date: 09/19/2021 CLINICAL DATA:  At 35 year old female presents after fall with lateral malleolar pain. EXAM: LEFT ANKLE COMPLETE - 3+ VIEW COMPARISON:  July 22, 2019. FINDINGS: W soft tissue swelling is present about the ankle. No fracture or dislocation. IMPRESSION: Soft tissue swelling without acute osseous abnormality. Electronically Signed   By: Donzetta Kohut M.D.   On: 09/19/2021 16:24   DG Foot Complete Left  Result Date: 09/19/2021 CLINICAL DATA:  A 35 year old female presents with foot and ankle pain following fall. EXAM: LEFT FOOT - COMPLETE 3+ VIEW COMPARISON:  Ankle evaluation of the same date. FINDINGS: Soft tissue swelling about the hindfoot and ankle. No sign of fracture or dislocation. No acute bony abnormality. Mild plantar enthesopathy and Achilles enthesopathy. IMPRESSION: Soft tissue swelling without acute fracture  or dislocation. Electronically Signed   By: Donzetta Kohut M.D.   On: 09/19/2021 16:28    Procedures Procedures (including critical care time)  Medications Ordered in UC Medications - No data to display  Initial Impression / Assessment and Plan / UC Course  I have reviewed the triage vital signs and the nursing notes.  Pertinent labs & imaging results that were available during my care of the patient were reviewed by me and considered in my medical decision making (see chart for details).  35 year old female presenting for multiple complaints.  First she complains of cough, congestion, sinus pressure, ear pain, body aches and chills.  Symptom onset 2 to 3 days ago.  Exposed to sick family members.  No known COVID exposure.  Vitals are stable.  She is overall well-appearing.  She does have nasal congestion on exam.  Chest clear to auscultation heart regular rate and rhythm.  PCR COVID test obtained.  Current CDC guidelines, isolation protocol and ED precautions reviewed with patient.  Advised her symptoms are consistent with viral URI.  Encouraged increasing rest and fluids.  Sent Bromfed-DM to pharmacy as well as Atrovent nasal spray.  Additionally she complains of left foot and ankle pain and swelling due to fall injury today.  X-rays obtained of foot and ankle and independently viewed by me.  X-rays are negative for any fractures.  Discussed with patient.  Patient concerned that she may need a boot since she has a lot of pain with weightbearing.  Patient given cam walking boot.  Advised on RICE guidelines.  Patient is taking anticoagulants I have advised her to use topical diclofenac and Tylenol extra strength as well as making sure to ice and elevate the foot to help with pain relief.  Reviewed following up with PCP if no improvement in the next 7 to 10 days.   Final Clinical Impressions(s) / UC Diagnoses   Final diagnoses:  Sprain of left ankle, unspecified ligament, initial encounter   Acute left ankle pain  Left foot pain  Acute upper respiratory infection  Acute cough     Discharge Instructions      SPRAIN: x-rays are negative for fractures! Stressed avoiding painful activities . Reviewed RICE guidelines. Use medications as directed, including topical diclofenac (anti inflammatory) May use Tylenol for pain relief.  If no improvement in the next 1-2 weeks, f/u with PCP or return to our office for reexamination, and please  feel free to call or return at any time for any questions or concerns you may have and we will be happy to help you!      URI/COLD SYMPTOMS: Your exam today is consistent with a viral illness. Antibiotics are not indicated at this time. Use medications as directed, including cough syrup, nasal saline, and decongestants. Your symptoms should improve over the next few days and resolve within 7-10 days. Increase rest and fluids. F/u if symptoms worsen or predominate such as sore throat, ear pain, productive cough, shortness of breath, or if you develop high fevers or worsening fatigue over the next several days.    I do have suspicion for COVID-19.  If your COVID test is positive we will give you a call.  If you are positive yet to be isolated 5 days from symptom onset and wear mask for 5 days.  Care is supportive.  I have sent cough medication to the pharmacy and a nasal spray.  Make sure to increase rest and fluids.  If you have any uncontrollable fever, worsening or severe fatigue, chest pain, breathing difficulty, please go to emergency department.     ED Prescriptions     Medication Sig Dispense Auth. Provider   brompheniramine-pseudoephedrine-DM 30-2-10 MG/5ML syrup Take 10 mLs by mouth 4 (four) times daily as needed for up to 7 days. 150 mL Eusebio Friendly B, PA-C   ipratropium (ATROVENT) 0.06 % nasal spray Place 2 sprays into both nostrils 4 (four) times daily. 15 mL Shirlee Latch, PA-C      PDMP not reviewed this encounter.   Shirlee Latch,  PA-C 09/19/21 1715

## 2021-09-19 NOTE — ED Triage Notes (Signed)
Left ankle pain after falling this morning, also c/o ear pain, congestion, headache, sinus pressure x 2 days.

## 2021-09-19 NOTE — Discharge Instructions (Signed)
SPRAIN: x-rays are negative for fractures! Stressed avoiding painful activities . Reviewed RICE guidelines. Use medications as directed, including topical diclofenac (anti inflammatory) May use Tylenol for pain relief.  If no improvement in the next 1-2 weeks, f/u with PCP or return to our office for reexamination, and please feel free to call or return at any time for any questions or concerns you may have and we will be happy to help you!      URI/COLD SYMPTOMS: Your exam today is consistent with a viral illness. Antibiotics are not indicated at this time. Use medications as directed, including cough syrup, nasal saline, and decongestants. Your symptoms should improve over the next few days and resolve within 7-10 days. Increase rest and fluids. F/u if symptoms worsen or predominate such as sore throat, ear pain, productive cough, shortness of breath, or if you develop high fevers or worsening fatigue over the next several days.    I do have suspicion for COVID-19.  If your COVID test is positive we will give you a call.  If you are positive yet to be isolated 5 days from symptom onset and wear mask for 5 days.  Care is supportive.  I have sent cough medication to the pharmacy and a nasal spray.  Make sure to increase rest and fluids.  If you have any uncontrollable fever, worsening or severe fatigue, chest pain, breathing difficulty, please go to emergency department.

## 2021-09-20 LAB — SARS CORONAVIRUS 2 (TAT 6-24 HRS): SARS Coronavirus 2: NEGATIVE

## 2021-09-28 ENCOUNTER — Other Ambulatory Visit: Payer: Self-pay | Admitting: Oncology

## 2021-09-29 ENCOUNTER — Other Ambulatory Visit: Payer: Medicare Other

## 2021-10-05 ENCOUNTER — Other Ambulatory Visit: Payer: Self-pay

## 2021-10-05 ENCOUNTER — Encounter: Payer: Self-pay | Admitting: *Deleted

## 2021-10-05 ENCOUNTER — Encounter: Payer: Medicare Other | Attending: Cardiology | Admitting: *Deleted

## 2021-10-05 DIAGNOSIS — I5032 Chronic diastolic (congestive) heart failure: Secondary | ICD-10-CM

## 2021-10-05 NOTE — Progress Notes (Signed)
Initial telephone orientation completed. Diagnosis can be found in [facility] 9/22. EP orientation scheduled for Wednesday 11/23 at 11am.

## 2021-10-13 ENCOUNTER — Ambulatory Visit: Payer: Medicare Other | Admitting: Psychology

## 2021-10-19 ENCOUNTER — Ambulatory Visit: Payer: Medicare Other

## 2021-11-10 ENCOUNTER — Telehealth: Payer: Self-pay | Admitting: Oncology

## 2021-11-10 NOTE — Telephone Encounter (Signed)
Pt called to make an appt to have lab work. Call back at [phone]

## 2021-11-14 ENCOUNTER — Encounter: Payer: Medicare Other | Attending: Cardiology | Admitting: *Deleted

## 2021-11-14 ENCOUNTER — Other Ambulatory Visit: Payer: Self-pay

## 2021-11-14 VITALS — Ht 69.0 in | Wt 381.3 lb

## 2021-11-14 DIAGNOSIS — I5032 Chronic diastolic (congestive) heart failure: Secondary | ICD-10-CM | POA: Diagnosis present

## 2021-11-14 NOTE — Progress Notes (Signed)
Pulmonary Individual Treatment Plan  Patient Details  Name: Andrea Miller MRN: [id number] Date of Birth: 08/02/1986 Referring Provider:   Flowsheet Row Pulmonary Rehab from 11/14/2021 in [facility] Cardiac and Pulmonary Rehab  Referring Provider Agbor-Etang       Initial Encounter Date:  Flowsheet Row Pulmonary Rehab from 11/14/2021 in [facility] Cardiac and Pulmonary Rehab  Date 11/14/21       Visit Diagnosis: Heart failure, diastolic, chronic (HCC)  Patient's Home Medications on Admission:  Current Outpatient Medications:    Adapalene 0.3 % gel, Apply 1 application topically at bedtime., Disp: , Rfl:    albuterol (PROVENTIL) (2.5 MG/3ML) 0.083% nebulizer solution, Take 3 mLs (2.5 mg total) by nebulization every 4 (four) hours as needed for wheezing or shortness of breath., Disp: 360 mL, Rfl: 0   Albuterol Sulfate (PROAIR RESPICLICK) 108 (90 Base) MCG/ACT AEPB, Inhale 2 puffs into the lungs every 6 (six) hours as needed., Disp: 1 each, Rfl: 0   amphetamine-dextroamphetamine (ADDERALL XR) 15 MG 24 hr capsule, TAKE 1 CAPSULE BY MOUTH EACH MORNING, Disp: , Rfl:    budesonide-formoterol (SYMBICORT) 160-4.5 MCG/ACT inhaler, Inhale 2 puffs into the lungs 2 (two) times daily., Disp: 10.2 g, Rfl: 3   budesonide-formoterol (SYMBICORT) 160-4.5 MCG/ACT inhaler, Inhale 2 puffs into the lungs in the morning and at bedtime., Disp: 1 each, Rfl: 6   buprenorphine (BUTRANS) 15 MCG/HR, Place 1 patch onto the skin once a week., Disp: , Rfl:    Calcium-Magnesium 500-250 MG TABS, Take 1 tablet by mouth 2 (two) times daily. , Disp: , Rfl:    cyanocobalamin (,VITAMIN B-12,) 1000 MCG/ML injection, Inject 1 mL (1,000 mcg total) into the muscle every 30 (thirty) days for 12 doses., Disp: 1 mL, Rfl: 11   Dexlansoprazole 30 MG capsule, Take 30 mg by mouth daily., Disp: , Rfl:    diclofenac Sodium (VOLTAREN) 1 % GEL, Apply 2 g topically 4 (four) times daily., Disp: 100 g, Rfl: 0   diltiazem (CARDIZEM CD) 360 MG 24 hr  capsule, Take 360 mg by mouth every morning., Disp: , Rfl:    escitalopram (LEXAPRO) 20 MG tablet, Take 20 mg by mouth daily., Disp: , Rfl:    fluticasone (FLONASE) 50 MCG/ACT nasal spray, Place 2 sprays into both nostrils daily as needed for allergies., Disp: 16 g, Rfl: 1   furosemide (LASIX) 20 MG tablet, Take 40 mg by mouth daily., Disp: , Rfl:    gabapentin (NEURONTIN) 800 MG tablet, Take 800 mg by mouth 3 (three) times daily., Disp: , Rfl:    hydrocortisone 2.5 % cream, Apply to eczema on face 1-2 times a day as needed until rash improved., Disp: 30 g, Rfl: 5   hydroxypropyl methylcellulose / hypromellose (ISOPTO TEARS / GONIOVISC) 2.5 % ophthalmic solution, Place 1 drop into both eyes 3 (three) times daily as needed for dry eyes., Disp: , Rfl:    ipratropium (ATROVENT) 0.06 % nasal spray, Place 2 sprays into both nostrils 4 (four) times daily., Disp: 15 mL, Rfl: 0   isosorbide mononitrate (IMDUR) 30 MG 24 hr tablet, Take 30 mg by mouth daily., Disp: , Rfl:    ketoconazole (NIZORAL) 2 % cream, Apply 1-2 times a day to itchy rash face, scalp, under breast until clear, then prn flares., Disp: 60 g, Rfl: 11   ketoconazole (NIZORAL) 2 % shampoo, Wash scalp 1-2 times a week, let sit 5 minutes and rinse out, Disp: 120 mL, Rfl: 11   lactulose (CHRONULAC) 10 GM/15ML solution, Take  30 g by mouth daily as needed for mild constipation., Disp: , Rfl: 5   lamoTRIgine (LAMICTAL) 200 MG tablet, Take 1 tablet by mouth 2 (two) times a day., Disp: , Rfl:    linaclotide (LINZESS) 290 MCG CAPS capsule, Take 290 mcg by mouth daily before breakfast., Disp: , Rfl:    Lurasidone HCl (LATUDA) 60 MG TABS, Take by mouth., Disp: , Rfl:    mometasone (ELOCON) 0.1 % cream, Apply to more severe areas eczema 1-2 times as directed. May use on face up to 1 week at a time. Avoid groin, underarms., Disp: 45 g, Rfl: 3   montelukast (SINGULAIR) 10 MG tablet, Take 1 tablet (10 mg total) by mouth at bedtime., Disp: 90 tablet, Rfl: 1    pimecrolimus (ELIDEL) 1 % cream, Apply topically to areas on face, body, body folds., Disp: 100 g, Rfl: 5   Potassium Chloride ER 20 MEQ TBCR, Take 20 mEq by mouth 2 (two) times daily. , Disp: , Rfl:    propranolol (INDERAL) 80 MG tablet, Take 80 mg by mouth 3 (three) times daily., Disp: , Rfl:    rosuvastatin (CRESTOR) 20 MG tablet, Take 20 mg by mouth daily., Disp: , Rfl:    SPIRIVA RESPIMAT 1.25 MCG/ACT AERS, INHALE 2 PUFFS INTO THE LUNGS DAILY., Disp: 4 g, Rfl: 0   SYRINGE/NEEDLE, DISP, 1 ML (B-D 1CC SLIP TIP SYR 25GX5/8") 25G X 5/8" 1 ML MISC, Inject 1 mL into the muscle once a week., Disp: 1 each, Rfl: 3   Topiramate ER (TROKENDI XR) 200 MG CP24, Take 1 capsule by mouth daily. , Disp: , Rfl:    tretinoin (RETIN-A) 0.025 % cream, Apply to face nightly as tolerated., Disp: 45 g, Rfl: 5   XARELTO 20 MG TABS tablet, TAKE 1 TABLET BY MOUTH EVERY DAY IN THE MORNING, Disp: 30 tablet, Rfl: 2 No current facility-administered medications for this visit.  Facility-Administered Medications Ordered in Other Visits:    0.9 %  sodium chloride infusion, , Intravenous, Once, Corcoran, Melissa C, MD   iron sucrose (VENOFER) injection 200 mg, 200 mg, Intravenous, Once, Creig Hines, MD  Past Medical History: Past Medical History:  Diagnosis Date   Anxiety    Asthma    Bipolar disorder (HCC)    Chest pain    and angina   CHF (congestive heart failure) (HCC)    Depression    Fibromyalgia    Hypertension    Left lumbar radiculopathy since 08/2014   secondary to work accident   Obesity    Pre-diabetes    Pulmonary embolus (HCC)    Seizures (HCC)    secondary to anxiety  last seizure 01/25/18   SVT (supraventricular tachycardia) (HCC)    with hx of syncope; managed by [facility]    Tobacco Use: Social History   Tobacco Use  Smoking Status Never  Smokeless Tobacco Never    Labs: Recent Review Flowsheet Data     Labs for ITP Cardiac and Pulmonary Rehab Latest Ref Rng & Units 08/30/2017  09/15/2018   Cholestrol 0 - 200 mg/dL 993(Z) -   LDLCALC 0 - 99 mg/dL 169(C) -   HDL >78 mg/dL 72 -   Trlycerides <938 mg/dL 80 -   Hemoglobin B0F 4.8 - 5.6 % - 5.9(H)        Pulmonary Assessment Scores:  Pulmonary Assessment Scores     Row Name 11/14/21 1614         ADL UCSD   SOB  Score total 106     Rest 3     Walk 5     Stairs 4     Bath 3     Dress 5     Shop 5       CAT Score   CAT Score 17       mMRC Score   mMRC Score 4              UCSD: Self-administered rating of dyspnea associated with activities of daily living (ADLs) 6-point scale (0 = "not at all" to 5 = "maximal or unable to do because of breathlessness")  Scoring Scores range from 0 to 120.  Minimally important difference is 5 units  CAT: CAT can identify the health impairment of COPD patients and is better correlated with disease progression.  CAT has a scoring range of zero to 40. The CAT score is classified into four groups of low (less than 10), medium (10 - 20), high (21-30) and very high (31-40) based on the impact level of disease on health status. A CAT score over 10 suggests significant symptoms.  A worsening CAT score could be explained by an exacerbation, poor medication adherence, poor inhaler technique, or progression of COPD or comorbid conditions.  CAT MCID is 2 points  mMRC: mMRC (Modified Medical Research Council) Dyspnea Scale is used to assess the degree of baseline functional disability in patients of respiratory disease due to dyspnea. No minimal important difference is established. A decrease in score of 1 point or greater is considered a positive change.   Pulmonary Function Assessment:   Exercise Target Goals: Exercise Program Goal: Individual exercise prescription set using results from initial 6 min walk test and THRR while considering  patients activity barriers and safety.   Exercise Prescription Goal: Initial exercise prescription builds to 30-45 minutes a day  of aerobic activity, 2-3 days per week.  Home exercise guidelines will be given to patient during program as part of exercise prescription that the participant will acknowledge.  Education: Aerobic Exercise: - Group verbal and visual presentation on the components of exercise prescription. Introduces F.I.T.T principle from ACSM for exercise prescriptions.  Reviews F.I.T.T. principles of aerobic exercise including progression. Written material given at graduation.   Education: Resistance Exercise: - Group verbal and visual presentation on the components of exercise prescription. Introduces F.I.T.T principle from ACSM for exercise prescriptions  Reviews F.I.T.T. principles of resistance exercise including progression. Written material given at graduation.    Education: Exercise & Equipment Safety: - Individual verbal instruction and demonstration of equipment use and safety with use of the equipment. Flowsheet Row Pulmonary Rehab from 11/14/2021 in [facility] Cardiac and Pulmonary Rehab  Date 11/14/21  Educator [facility]  Instruction Review Code 1- Verbalizes Understanding       Education: Exercise Physiology & General Exercise Guidelines: - Group verbal and written instruction with models to review the exercise physiology of the cardiovascular system and associated critical values. Provides general exercise guidelines with specific guidelines to those with heart or lung disease.    Education: Flexibility, Balance, Mind/Body Relaxation: - Group verbal and visual presentation with interactive activity on the components of exercise prescription. Introduces F.I.T.T principle from ACSM for exercise prescriptions. Reviews F.I.T.T. principles of flexibility and balance exercise training including progression. Also discusses the mind body connection.  Reviews various relaxation techniques to help reduce and manage stress (i.e. Deep breathing, progressive muscle relaxation, and visualization). Balance handout  provided to take home. Written material given at graduation.  Activity Barriers & Risk Stratification:  Activity Barriers & Cardiac Risk Stratification - 10/05/21 1121       Activity Barriers & Cardiac Risk Stratification   Activity Barriers Arthritis;Back Problems;History of Falls;Deconditioning;Other (comment);Joint Problems    Comments Back, Right knee, and left ankle hurt daily             6 Minute Walk:  6 Minute Walk     Row Name 11/14/21 1556         6 Minute Walk   Phase Initial     Distance 765 feet     Walk Time 4.75 minutes     # of Rest Breaks 1     MPH 1.83     METS 2.5     RPE 19     Perceived Dyspnea  4     VO2 Peak 8.65     Symptoms Yes (comment)     Comments dizzy/SOB - relieved with rest     Resting HR 88 bpm     Resting BP 144/74     Resting Oxygen Saturation  98 %     Exercise Oxygen Saturation  during 6 min walk 93 %     Max Ex. HR 109 bpm     Max Ex. BP 148/70     2 Minute Post BP 128/74       Interval HR   1 Minute HR 105     2 Minute HR 109     3 Minute HR 98     4 Minute HR 85     5 Minute HR 87     6 Minute HR 109     2 Minute Post HR 91     Interval Heart Rate? Yes       Interval Oxygen   Interval Oxygen? Yes     Baseline Oxygen Saturation % 98 %     1 Minute Oxygen Saturation % 93 %     1 Minute Liters of Oxygen 0 L     2 Minute Oxygen Saturation % 94 %     2 Minute Liters of Oxygen 0 L     3 Minute Oxygen Saturation % 95 %     3 Minute Liters of Oxygen 0 L     4 Minute Oxygen Saturation % 96 %     4 Minute Liters of Oxygen 0 L     5 Minute Oxygen Saturation % 97 %     5 Minute Liters of Oxygen 0 L     6 Minute Oxygen Saturation % 95 %     6 Minute Liters of Oxygen 0 L     2 Minute Post Oxygen Saturation % 95 %     2 Minute Post Liters of Oxygen 0 L             Oxygen Initial Assessment:  Oxygen Initial Assessment - 10/05/21 1122       Home Oxygen   Home Oxygen Device None    Sleep Oxygen Prescription CPAP     Home Exercise Oxygen Prescription None    Home Resting Oxygen Prescription None      Intervention   Short Term Goals To learn and understand importance of monitoring SPO2 with pulse oximeter and demonstrate accurate use of the pulse oximeter.;To learn and demonstrate proper pursed lip breathing techniques or other breathing techniques. ;To learn and understand importance of maintaining oxygen saturations>88%;To learn and demonstrate proper use of respiratory medications  Long  Term Goals Verbalizes importance of monitoring SPO2 with pulse oximeter and return demonstration;Maintenance of O2 saturations>88%;Exhibits proper breathing techniques, such as pursed lip breathing or other method taught during program session;Compliance with respiratory medication;Demonstrates proper use of MDIs             Oxygen Re-Evaluation:   Oxygen Discharge (Final Oxygen Re-Evaluation):   Initial Exercise Prescription:  Initial Exercise Prescription - 11/14/21 1600       Date of Initial Exercise RX and Referring Provider   Date 11/14/21    Referring Provider Agbor-Etang      Oxygen   Maintain Oxygen Saturation 88% or higher      NuStep   Level 1    SPM 80    Minutes 15    METs 2.5      Arm Ergometer   Level 1    RPM 25    Minutes 15    METs 2.5      Biostep-RELP   Level 1    SPM 50    Minutes 15    METs 2      Track   Laps 10    Minutes 15    METs 1.5      Prescription Details   Frequency (times per week) 3    Duration Progress to 30 minutes of continuous aerobic without signs/symptoms of physical distress      Intensity   THRR 40-80% of Max Heartrate 128-168    Ratings of Perceived Exertion 11-15    Perceived Dyspnea 0-4      Resistance Training   Training Prescription Yes    Weight 3 lb    Reps 10-15             Perform Capillary Blood Glucose checks as needed.  Exercise Prescription Changes:   Exercise Prescription Changes     Row Name 11/14/21  1600             Response to Exercise   Blood Pressure (Admit) 144/74       Blood Pressure (Exercise) 148/70       Blood Pressure (Exit) 128/74       Heart Rate (Admit) 88 bpm       Heart Rate (Exercise) 109 bpm       Heart Rate (Exit) 91 bpm       Oxygen Saturation (Admit) 98 %       Oxygen Saturation (Exercise) 93 %       Oxygen Saturation (Exit) 95 %       Rating of Perceived Exertion (Exercise) 19       Perceived Dyspnea (Exercise) 4       Symptoms SOB/dizziness                Exercise Comments:   Exercise Goals and Review:   Exercise Goals     Row Name 11/14/21 1608             Exercise Goals   Increase Physical Activity Yes       Intervention Provide advice, education, support and counseling about physical activity/exercise needs.;Develop an individualized exercise prescription for aerobic and resistive training based on initial evaluation findings, risk stratification, comorbidities and participant's personal goals.       Expected Outcomes Short Term: Attend rehab on a regular basis to increase amount of physical activity.;Long Term: Add in home exercise to make exercise part of routine and to increase amount of physical activity.;Long Term: Exercising regularly at least  3-5 days a week.       Increase Strength and Stamina Yes       Intervention Provide advice, education, support and counseling about physical activity/exercise needs.;Develop an individualized exercise prescription for aerobic and resistive training based on initial evaluation findings, risk stratification, comorbidities and participant's personal goals.       Expected Outcomes Short Term: Increase workloads from initial exercise prescription for resistance, speed, and METs.;Short Term: Perform resistance training exercises routinely during rehab and add in resistance training at home;Long Term: Improve cardiorespiratory fitness, muscular endurance and strength as measured by increased METs and  functional capacity ( )       Able to understand and use rate of perceived exertion (RPE) scale Yes       Intervention Provide education and explanation on how to use RPE scale       Expected Outcomes Short Term: Able to use RPE daily in rehab to express subjective intensity level;Long Term:  Able to use RPE to guide intensity level when exercising independently       Able to understand and use Dyspnea scale Yes       Intervention Provide education and explanation on how to use Dyspnea scale       Expected Outcomes Short Term: Able to use Dyspnea scale daily in rehab to express subjective sense of shortness of breath during exertion;Long Term: Able to use Dyspnea scale to guide intensity level when exercising independently       Knowledge and understanding of Target Heart Rate Range (THRR) Yes       Intervention Provide education and explanation of THRR including how the numbers were predicted and where they are located for reference       Expected Outcomes Short Term: Able to state/look up THRR;Short Term: Able to use daily as guideline for intensity in rehab;Long Term: Able to use THRR to govern intensity when exercising independently       Able to check pulse independently Yes       Intervention Provide education and demonstration on how to check pulse in carotid and radial arteries.;Review the importance of being able to check your own pulse for safety during independent exercise       Expected Outcomes Short Term: Able to explain why pulse checking is important during independent exercise;Long Term: Able to check pulse independently and accurately       Understanding of Exercise Prescription Yes       Intervention Provide education, explanation, and written materials on patient's individual exercise prescription       Expected Outcomes Short Term: Able to explain program exercise prescription;Long Term: Able to explain home exercise prescription to exercise independently                 Exercise Goals Re-Evaluation :   Discharge Exercise Prescription (Final Exercise Prescription Changes):  Exercise Prescription Changes - 11/14/21 1600       Response to Exercise   Blood Pressure (Admit) 144/74    Blood Pressure (Exercise) 148/70    Blood Pressure (Exit) 128/74    Heart Rate (Admit) 88 bpm    Heart Rate (Exercise) 109 bpm    Heart Rate (Exit) 91 bpm    Oxygen Saturation (Admit) 98 %    Oxygen Saturation (Exercise) 93 %    Oxygen Saturation (Exit) 95 %    Rating of Perceived Exertion (Exercise) 19    Perceived Dyspnea (Exercise) 4    Symptoms SOB/dizziness  Nutrition:  Target Goals: Understanding of nutrition guidelines, daily intake of sodium 1500mg , cholesterol 200mg , calories 30% from fat and 7% or less from saturated fats, daily to have 5 or more servings of fruits and vegetables.  Education: All About Nutrition: -Group instruction provided by verbal, written material, interactive activities, discussions, models, and posters to present general guidelines for heart healthy nutrition including fat, fiber, MyPlate, the role of sodium in heart healthy nutrition, utilization of the nutrition label, and utilization of this knowledge for meal planning. Follow up email sent as well. Written material given at graduation. Flowsheet Row Pulmonary Rehab from 11/14/2021 in [facility] Cardiac and Pulmonary Rehab  Education need identified 11/14/21       Biometrics:  Pre Biometrics - 11/14/21 1609       Pre Biometrics   Height 5\' 9"  (1.753 m)    Weight 381 lb 4.8 oz (173 kg)    BMI (Calculated) 56.28              Nutrition Therapy Plan and Nutrition Goals:   Nutrition Assessments:  MEDIFICTS Score Key: ?70 Need to make dietary changes  40-70 Heart Healthy Diet ? 40 Therapeutic Level Cholesterol Diet  Flowsheet Row Pulmonary Rehab from 11/14/2021 in [facility] Cardiac and Pulmonary Rehab  Picture Your Plate Total Score on Admission 57       Picture Your Plate Scores: [facility] Unhealthy dietary pattern with much room for improvement. 41-50 Dietary pattern unlikely to meet recommendations for good health and room for improvement. 51-60 More healthful dietary pattern, with some room for improvement.  >60 Healthy dietary pattern, although there may be some specific behaviors that could be improved.   Nutrition Goals Re-Evaluation:   Nutrition Goals Discharge (Final Nutrition Goals Re-Evaluation):   Psychosocial: Target Goals: Acknowledge presence or absence of significant depression and/or stress, maximize coping skills, provide positive support system. Participant is able to verbalize types and ability to use techniques and skills needed for reducing stress and depression.   Education: Stress, Anxiety, and Depression - Group verbal and visual presentation to define topics covered.  Reviews how body is impacted by stress, anxiety, and depression.  Also discusses healthy ways to reduce stress and to treat/manage anxiety and depression.  Written material given at graduation.   Education: Sleep Hygiene -Provides group verbal and written instruction about how sleep can affect your health.  Define sleep hygiene, discuss sleep cycles and impact of sleep habits. Review good sleep hygiene tips.    Initial Review & Psychosocial Screening:  Initial Psych Review & Screening - 10/05/21 1124       Initial Review   Current issues with Current Depression;Current Anxiety/Panic;Current Sleep Concerns;Current Psychotropic Meds      Family Dynamics   Good Support System? Yes   mother     Barriers   Psychosocial barriers to participate in program There are no identifiable barriers or psychosocial needs.      Screening Interventions   Interventions Encouraged to exercise;Provide feedback about the scores to participant;To provide support and resources with identified psychosocial needs    Expected Outcomes Short Term goal: Utilizing  psychosocial counselor, staff and physician to assist with identification of specific Stressors or current issues interfering with healing process. Setting desired goal for each stressor or current issue identified.;Short Term goal: Identification and review with participant of any Quality of Life or Depression concerns found by scoring the questionnaire.;Long Term Goal: Stressors or current issues are controlled or eliminated.;Long Term goal: The participant improves quality of Life  and PHQ9 Scores as seen by post scores and/or verbalization of changes             Quality of Life Scores:  Scores of 19 and below usually indicate a poorer quality of life in these areas.  A difference of  2-3 points is a clinically meaningful difference.  A difference of 2-3 points in the total score of the Quality of Life Index has been associated with significant improvement in overall quality of life, self-image, physical symptoms, and general health in studies assessing change in quality of life.  PHQ-9: Recent Review Flowsheet Data     Depression screen [facility] 2/9 11/14/2021   Decreased Interest 3   Down, Depressed, Hopeless 3   PHQ - 2 Score 6   Altered sleeping 3   Tired, decreased energy 3   Change in appetite 3   Feeling bad or failure about yourself  3   Trouble concentrating 3   Moving slowly or fidgety/restless 3   Suicidal thoughts 3    PHQ-9 Score 27   Difficult doing work/chores Extremely dIfficult       Interpretation of Total Score  Total Score Depression Severity:  1-4 = Minimal depression, 5-9 = Mild depression, 10-14 = Moderate depression, 15-19 = Moderately severe depression, 20-27 = Severe depression   Psychosocial Evaluation and Intervention:  Psychosocial Evaluation - 10/05/21 1126       Psychosocial Evaluation & Interventions   Interventions Encouraged to exercise with the program and follow exercise prescription;Stress management education;Relaxation education    Comments  Ms. Flaim has multiple health issues including asthma and sleep apnea as well as heart failure. She was diagnosed with Bipolar affective disorder in 2011 and reports personaility changes. She is on medication and in good communication with her doctor to help manage her symptoms. She does not sleep well due to PTSD and medication only works sometimes. She has had an ongoing history of anxiety and depression, with the anxiety causing seizures in the past. She has fallen multiple times in the last year due to light headedness which is brought on by an increased heart rate. She is wanting to try the program to help with her qaulity of life and help manage her symptoms.    Expected Outcomes Short: attend pulmonary rehab for education and exercise. Long: develop and maintain positive self care habits.    Continue Psychosocial Services  Follow up required by staff             Psychosocial Re-Evaluation:   Psychosocial Discharge (Final Psychosocial Re-Evaluation):   Education: Education Goals: Education classes will be provided on a weekly basis, covering required topics. Participant will state understanding/return demonstration of topics presented.  Learning Barriers/Preferences:  Learning Barriers/Preferences - 10/05/21 1124       Learning Barriers/Preferences   Learning Barriers None    Learning Preferences None             General Pulmonary Education Topics:  Infection Prevention: - Provides verbal and written material to individual with discussion of infection control including proper hand washing and proper equipment cleaning during exercise session. Flowsheet Row Pulmonary Rehab from 11/14/2021 in [facility] Cardiac and Pulmonary Rehab  Date 11/14/21  Educator [facility]  Instruction Review Code 1- Verbalizes Understanding       Falls Prevention: - Provides verbal and written material to individual with discussion of falls prevention and safety. Flowsheet Row Pulmonary Rehab from  11/14/2021 in [facility] Cardiac and Pulmonary Rehab  Date 11/14/21  Educator  [facility]  Instruction Review Code 1- Verbalizes Understanding       Chronic Lung Disease Review: - Group verbal instruction with posters, models, PowerPoint presentations and videos,  to review new updates, new respiratory medications, new advancements in procedures and treatments. Providing information on websites and "800" numbers for continued self-education. Includes information about supplement oxygen, available portable oxygen systems, continuous and intermittent flow rates, oxygen safety, concentrators, and Medicare reimbursement for oxygen. Explanation of Pulmonary Drugs, including class, frequency, complications, importance of spacers, rinsing mouth after steroid MDI's, and proper cleaning methods for nebulizers. Review of basic lung anatomy and physiology related to function, structure, and complications of lung disease. Review of risk factors. Discussion about methods for diagnosing sleep apnea and types of masks and machines for OSA. Includes a review of the use of types of environmental controls: home humidity, furnaces, filters, dust mite/pet prevention, HEPA vacuums. Discussion about weather changes, air quality and the benefits of nasal washing. Instruction on Warning signs, infection symptoms, calling MD promptly, preventive modes, and value of vaccinations. Review of effective airway clearance, coughing and/or vibration techniques. Emphasizing that all should Create an Action Plan. Written material given at graduation. Flowsheet Row Pulmonary Rehab from 11/14/2021 in [facility] Cardiac and Pulmonary Rehab  Education need identified 11/14/21       AED/CPR: - Group verbal and written instruction with the use of models to demonstrate the basic use of the AED with the basic ABC's of resuscitation.    Anatomy and Cardiac Procedures: - Group verbal and visual presentation and models provide information about basic cardiac  anatomy and function. Reviews the testing methods done to diagnose heart disease and the outcomes of the test results. Describes the treatment choices: Medical Management, Angioplasty, or Coronary Bypass Surgery for treating various heart conditions including Myocardial Infarction, Angina, Valve Disease, and Cardiac Arrhythmias.  Written material given at graduation.   Medication Safety: - Group verbal and visual instruction to review commonly prescribed medications for heart and lung disease. Reviews the medication, class of the drug, and side effects. Includes the steps to properly store meds and maintain the prescription regimen.  Written material given at graduation.   Other: -Provides group and verbal instruction on various topics (see comments)   Knowledge Questionnaire Score:  Knowledge Questionnaire Score - 11/14/21 1611       Knowledge Questionnaire Score   Pre Score 10/18              Core Components/Risk Factors/Patient Goals at Admission:  Personal Goals and Risk Factors at Admission - 11/14/21 1610       Core Components/Risk Factors/Patient Goals on Admission    Weight Management Yes;Weight Loss    Intervention Weight Management: Provide education and appropriate resources to help participant work on and attain dietary goals.;Weight Management/Obesity: Establish reasonable short term and long term weight goals.;Weight Management: Develop a combined nutrition and exercise program designed to reach desired caloric intake, while maintaining appropriate intake of nutrient and fiber, sodium and fats, and appropriate energy expenditure required for the weight goal.    Expected Outcomes Short Term: Continue to assess and modify interventions until short term weight is achieved;Weight Loss: Understanding of general recommendations for a balanced deficit meal plan, which promotes 1-2 lb weight loss per week and includes a negative energy balance of [phone] kcal/d;Understanding  recommendations for meals to include 15-35% energy as protein, 25-35% energy from fat, 35-60% energy from carbohydrates, less than 200mg  of dietary cholesterol, 20-35 gm of total fiber daily;Understanding of distribution  of calorie intake throughout the day with the consumption of 4-5 meals/snacks;Long Term: Adherence to nutrition and physical activity/exercise program aimed toward attainment of established weight goal    Increase knowledge of respiratory medications and ability to use respiratory devices properly  Yes    Intervention Provide education and demonstration as needed of appropriate use of medications, inhalers, and oxygen therapy.    Expected Outcomes Short Term: Achieves understanding of medications use. Understands that oxygen is a medication prescribed by physician. Demonstrates appropriate use of inhaler and oxygen therapy.    Hypertension Yes    Intervention Provide education on lifestyle modifcations including regular physical activity/exercise, weight management, moderate sodium restriction and increased consumption of fresh fruit, vegetables, and low fat dairy, alcohol moderation, and smoking cessation.;Monitor prescription use compliance.    Expected Outcomes Short Term: Continued assessment and intervention until BP is < 140/23mm HG in hypertensive participants. < 130/22mm HG in hypertensive participants with diabetes, heart failure or chronic kidney disease.;Long Term: Maintenance of blood pressure at goal levels.    Lipids Yes    Intervention Provide education and support for participant on nutrition & aerobic/resistive exercise along with prescribed medications to achieve LDL 70mg , HDL >40mg .    Expected Outcomes Short Term: Participant states understanding of desired cholesterol values and is compliant with medications prescribed. Participant is following exercise prescription and nutrition guidelines.;Long Term: Cholesterol controlled with medications as prescribed, with  individualized exercise RX and with personalized nutrition plan. Value goals: LDL < , HDL > 40 mg.             Education:Diabetes - Individual verbal and written instruction to review signs/symptoms of diabetes, desired ranges of glucose level fasting, after meals and with exercise. Acknowledge that pre and post exercise glucose checks will be done for 3 sessions at entry of program.   Know Your Numbers and Heart Failure: - Group verbal and visual instruction to discuss disease risk factors for cardiac and pulmonary disease and treatment options.  Reviews associated critical values for Overweight/Obesity, Hypertension, Cholesterol, and Diabetes.  Discusses basics of heart failure: signs/symptoms and treatments.  Introduces Heart Failure Zone chart for action plan for heart failure.  Written material given at graduation.   Core Components/Risk Factors/Patient Goals Review:    Core Components/Risk Factors/Patient Goals at Discharge (Final Review):    ITP Comments:  ITP Comments     Row Name 10/05/21 1116           ITP Comments Initial telephone orientation completed. Diagnosis can be found in [facility] 9/22. EP orientation scheduled for Wednesday 11/23 at 11am.                Comments: initial ITP

## 2021-11-14 NOTE — Patient Instructions (Signed)
Patient Instructions  Patient Details  Name: Andrea Miller MRN: [id number] Date of Birth: 11/22/1986 Referring Provider:  Debbe Odea, MD  Below are your personal goals for exercise, nutrition, and risk factors. Our goal is to help you stay on track towards obtaining and maintaining these goals. We will be discussing your progress on these goals with you throughout the program.  Initial Exercise Prescription:  Initial Exercise Prescription - 11/14/21 1600       Date of Initial Exercise RX and Referring Provider   Date 11/14/21    Referring Provider Agbor-Etang      Oxygen   Maintain Oxygen Saturation 88% or higher      NuStep   Level 1    SPM 80    Minutes 15    METs 2.5      Arm Ergometer   Level 1    RPM 25    Minutes 15    METs 2.5      Biostep-RELP   Level 1    SPM 50    Minutes 15    METs 2      Track   Laps 10    Minutes 15    METs 1.5      Prescription Details   Frequency (times per week) 3    Duration Progress to 30 minutes of continuous aerobic without signs/symptoms of physical distress      Intensity   THRR 40-80% of Max Heartrate 128-168    Ratings of Perceived Exertion 11-15    Perceived Dyspnea 0-4      Resistance Training   Training Prescription Yes    Weight 3 lb    Reps 10-15             Exercise Goals: Frequency: Be able to perform aerobic exercise two to three times per week in program working toward 2-5 days per week of home exercise.  Intensity: Work with a perceived exertion of 11 (fairly light) - 15 (hard) while following your exercise prescription.  We will make changes to your prescription with you as you progress through the program.   Duration: Be able to do 30 to 45 minutes of continuous aerobic exercise in addition to a 5 minute warm-up and a 5 minute cool-down routine.   Nutrition Goals: Your personal nutrition goals will be established when you do your nutrition analysis with the dietician.  The following  are general nutrition guidelines to follow: Cholesterol < 200mg /day Sodium < 1500mg /day Fiber: Women under 50 yrs - 25 grams per day  Personal Goals:  Personal Goals and Risk Factors at Admission - 11/14/21 1610       Core Components/Risk Factors/Patient Goals on Admission    Weight Management Yes;Weight Loss    Intervention Weight Management: Provide education and appropriate resources to help participant work on and attain dietary goals.;Weight Management/Obesity: Establish reasonable short term and long term weight goals.;Weight Management: Develop a combined nutrition and exercise program designed to reach desired caloric intake, while maintaining appropriate intake of nutrient and fiber, sodium and fats, and appropriate energy expenditure required for the weight goal.    Expected Outcomes Short Term: Continue to assess and modify interventions until short term weight is achieved;Weight Loss: Understanding of general recommendations for a balanced deficit meal plan, which promotes 1-2 lb weight loss per week and includes a negative energy balance of [phone] kcal/d;Understanding recommendations for meals to include 15-35% energy as protein, 25-35% energy from fat, 35-60% energy from carbohydrates, less than 200mg   of dietary cholesterol, 20-35 gm of total fiber daily;Understanding of distribution of calorie intake throughout the day with the consumption of 4-5 meals/snacks;Long Term: Adherence to nutrition and physical activity/exercise program aimed toward attainment of established weight goal    Increase knowledge of respiratory medications and ability to use respiratory devices properly  Yes    Intervention Provide education and demonstration as needed of appropriate use of medications, inhalers, and oxygen therapy.    Expected Outcomes Short Term: Achieves understanding of medications use. Understands that oxygen is a medication prescribed by physician. Demonstrates appropriate use of inhaler  and oxygen therapy.    Hypertension Yes    Intervention Provide education on lifestyle modifcations including regular physical activity/exercise, weight management, moderate sodium restriction and increased consumption of fresh fruit, vegetables, and low fat dairy, alcohol moderation, and smoking cessation.;Monitor prescription use compliance.    Expected Outcomes Short Term: Continued assessment and intervention until BP is < 140/66mm HG in hypertensive participants. < 130/33mm HG in hypertensive participants with diabetes, heart failure or chronic kidney disease.;Long Term: Maintenance of blood pressure at goal levels.    Lipids Yes    Intervention Provide education and support for participant on nutrition & aerobic/resistive exercise along with prescribed medications to achieve LDL 70mg , HDL >40mg .    Expected Outcomes Short Term: Participant states understanding of desired cholesterol values and is compliant with medications prescribed. Participant is following exercise prescription and nutrition guidelines.;Long Term: Cholesterol controlled with medications as prescribed, with individualized exercise RX and with personalized nutrition plan. Value goals: LDL < 70mg , HDL > 40 mg.             Tobacco Use Initial Evaluation: Social History   Tobacco Use  Smoking Status Never  Smokeless Tobacco Never    Exercise Goals and Review:  Exercise Goals     Row Name 11/14/21 1608             Exercise Goals   Increase Physical Activity Yes       Intervention Provide advice, education, support and counseling about physical activity/exercise needs.;Develop an individualized exercise prescription for aerobic and resistive training based on initial evaluation findings, risk stratification, comorbidities and participant's personal goals.       Expected Outcomes Short Term: Attend rehab on a regular basis to increase amount of physical activity.;Long Term: Add in home exercise to make exercise  part of routine and to increase amount of physical activity.;Long Term: Exercising regularly at least 3-5 days a week.       Increase Strength and Stamina Yes       Intervention Provide advice, education, support and counseling about physical activity/exercise needs.;Develop an individualized exercise prescription for aerobic and resistive training based on initial evaluation findings, risk stratification, comorbidities and participant's personal goals.       Expected Outcomes Short Term: Increase workloads from initial exercise prescription for resistance, speed, and METs.;Short Term: Perform resistance training exercises routinely during rehab and add in resistance training at home;Long Term: Improve cardiorespiratory fitness, muscular endurance and strength as measured by increased METs and functional capacity (11/16/21)       Able to understand and use rate of perceived exertion (RPE) scale Yes       Intervention Provide education and explanation on how to use RPE scale       Expected Outcomes Short Term: Able to use RPE daily in rehab to express subjective intensity level;Long Term:  Able to use RPE to guide intensity level when exercising independently  Able to understand and use Dyspnea scale Yes       Intervention Provide education and explanation on how to use Dyspnea scale       Expected Outcomes Short Term: Able to use Dyspnea scale daily in rehab to express subjective sense of shortness of breath during exertion;Long Term: Able to use Dyspnea scale to guide intensity level when exercising independently       Knowledge and understanding of Target Heart Rate Range (THRR) Yes       Intervention Provide education and explanation of THRR including how the numbers were predicted and where they are located for reference       Expected Outcomes Short Term: Able to state/look up THRR;Short Term: Able to use daily as guideline for intensity in rehab;Long Term: Able to use THRR to govern intensity when  exercising independently       Able to check pulse independently Yes       Intervention Provide education and demonstration on how to check pulse in carotid and radial arteries.;Review the importance of being able to check your own pulse for safety during independent exercise       Expected Outcomes Short Term: Able to explain why pulse checking is important during independent exercise;Long Term: Able to check pulse independently and accurately       Understanding of Exercise Prescription Yes       Intervention Provide education, explanation, and written materials on patient's individual exercise prescription       Expected Outcomes Short Term: Able to explain program exercise prescription;Long Term: Able to explain home exercise prescription to exercise independently                Copy of goals given to participant.

## 2021-11-23 ENCOUNTER — Encounter: Payer: Self-pay | Admitting: *Deleted

## 2021-11-23 ENCOUNTER — Ambulatory Visit: Payer: Medicare Other

## 2021-11-23 DIAGNOSIS — I5032 Chronic diastolic (congestive) heart failure: Secondary | ICD-10-CM

## 2021-11-23 NOTE — Progress Notes (Signed)
Pulmonary Individual Treatment Plan  Patient Details  Name: Andrea Miller MRN: [id number] Date of Birth: 08/02/1986 Referring Provider:   Flowsheet Row Pulmonary Rehab from 11/14/2021 in [facility] Cardiac and Pulmonary Rehab  Referring Provider Agbor-Etang       Initial Encounter Date:  Flowsheet Row Pulmonary Rehab from 11/14/2021 in [facility] Cardiac and Pulmonary Rehab  Date 11/14/21       Visit Diagnosis: Heart failure, diastolic, chronic (HCC)  Patient's Home Medications on Admission:  Current Outpatient Medications:    Adapalene 0.3 % gel, Apply 1 application topically at bedtime., Disp: , Rfl:    albuterol (PROVENTIL) (2.5 MG/3ML) 0.083% nebulizer solution, Take 3 mLs (2.5 mg total) by nebulization every 4 (four) hours as needed for wheezing or shortness of breath., Disp: 360 mL, Rfl: 0   Albuterol Sulfate (PROAIR RESPICLICK) 108 (90 Base) MCG/ACT AEPB, Inhale 2 puffs into the lungs every 6 (six) hours as needed., Disp: 1 each, Rfl: 0   amphetamine-dextroamphetamine (ADDERALL XR) 15 MG 24 hr capsule, TAKE 1 CAPSULE BY MOUTH EACH MORNING, Disp: , Rfl:    budesonide-formoterol (SYMBICORT) 160-4.5 MCG/ACT inhaler, Inhale 2 puffs into the lungs 2 (two) times daily., Disp: 10.2 g, Rfl: 3   budesonide-formoterol (SYMBICORT) 160-4.5 MCG/ACT inhaler, Inhale 2 puffs into the lungs in the morning and at bedtime., Disp: 1 each, Rfl: 6   buprenorphine (BUTRANS) 15 MCG/HR, Place 1 patch onto the skin once a week., Disp: , Rfl:    Calcium-Magnesium 500-250 MG TABS, Take 1 tablet by mouth 2 (two) times daily. , Disp: , Rfl:    cyanocobalamin (,VITAMIN B-12,) 1000 MCG/ML injection, Inject 1 mL (1,000 mcg total) into the muscle every 30 (thirty) days for 12 doses., Disp: 1 mL, Rfl: 11   Dexlansoprazole 30 MG capsule, Take 30 mg by mouth daily., Disp: , Rfl:    diclofenac Sodium (VOLTAREN) 1 % GEL, Apply 2 g topically 4 (four) times daily., Disp: 100 g, Rfl: 0   diltiazem (CARDIZEM CD) 360 MG 24 hr  capsule, Take 360 mg by mouth every morning., Disp: , Rfl:    escitalopram (LEXAPRO) 20 MG tablet, Take 20 mg by mouth daily., Disp: , Rfl:    fluticasone (FLONASE) 50 MCG/ACT nasal spray, Place 2 sprays into both nostrils daily as needed for allergies., Disp: 16 g, Rfl: 1   furosemide (LASIX) 20 MG tablet, Take 40 mg by mouth daily., Disp: , Rfl:    gabapentin (NEURONTIN) 800 MG tablet, Take 800 mg by mouth 3 (three) times daily., Disp: , Rfl:    hydrocortisone 2.5 % cream, Apply to eczema on face 1-2 times a day as needed until rash improved., Disp: 30 g, Rfl: 5   hydroxypropyl methylcellulose / hypromellose (ISOPTO TEARS / GONIOVISC) 2.5 % ophthalmic solution, Place 1 drop into both eyes 3 (three) times daily as needed for dry eyes., Disp: , Rfl:    ipratropium (ATROVENT) 0.06 % nasal spray, Place 2 sprays into both nostrils 4 (four) times daily., Disp: 15 mL, Rfl: 0   isosorbide mononitrate (IMDUR) 30 MG 24 hr tablet, Take 30 mg by mouth daily., Disp: , Rfl:    ketoconazole (NIZORAL) 2 % cream, Apply 1-2 times a day to itchy rash face, scalp, under breast until clear, then prn flares., Disp: 60 g, Rfl: 11   ketoconazole (NIZORAL) 2 % shampoo, Wash scalp 1-2 times a week, let sit 5 minutes and rinse out, Disp: 120 mL, Rfl: 11   lactulose (CHRONULAC) 10 GM/15ML solution, Take  30 g by mouth daily as needed for mild constipation., Disp: , Rfl: 5   lamoTRIgine (LAMICTAL) 200 MG tablet, Take 1 tablet by mouth 2 (two) times a day., Disp: , Rfl:    linaclotide (LINZESS) 290 MCG CAPS capsule, Take 290 mcg by mouth daily before breakfast., Disp: , Rfl:    Lurasidone HCl (LATUDA) 60 MG TABS, Take by mouth., Disp: , Rfl:    mometasone (ELOCON) 0.1 % cream, Apply to more severe areas eczema 1-2 times as directed. May use on face up to 1 week at a time. Avoid groin, underarms., Disp: 45 g, Rfl: 3   montelukast (SINGULAIR) 10 MG tablet, Take 1 tablet (10 mg total) by mouth at bedtime., Disp: 90 tablet, Rfl: 1    pimecrolimus (ELIDEL) 1 % cream, Apply topically to areas on face, body, body folds., Disp: 100 g, Rfl: 5   Potassium Chloride ER 20 MEQ TBCR, Take 20 mEq by mouth 2 (two) times daily. , Disp: , Rfl:    propranolol (INDERAL) 80 MG tablet, Take 80 mg by mouth 3 (three) times daily., Disp: , Rfl:    rosuvastatin (CRESTOR) 20 MG tablet, Take 20 mg by mouth daily., Disp: , Rfl:    SPIRIVA RESPIMAT 1.25 MCG/ACT AERS, INHALE 2 PUFFS INTO THE LUNGS DAILY., Disp: 4 g, Rfl: 0   SYRINGE/NEEDLE, DISP, 1 ML (B-D 1CC SLIP TIP SYR 25GX5/8") 25G X 5/8" 1 ML MISC, Inject 1 mL into the muscle once a week., Disp: 1 each, Rfl: 3   Topiramate ER (TROKENDI XR) 200 MG CP24, Take 1 capsule by mouth daily. , Disp: , Rfl:    tretinoin (RETIN-A) 0.025 % cream, Apply to face nightly as tolerated., Disp: 45 g, Rfl: 5   XARELTO 20 MG TABS tablet, TAKE 1 TABLET BY MOUTH EVERY DAY IN THE MORNING, Disp: 30 tablet, Rfl: 2 No current facility-administered medications for this visit.  Facility-Administered Medications Ordered in Other Visits:    0.9 %  sodium chloride infusion, , Intravenous, Once, Corcoran, Melissa C, MD   iron sucrose (VENOFER) injection 200 mg, 200 mg, Intravenous, Once, Creig Hines, MD  Past Medical History: Past Medical History:  Diagnosis Date   Anxiety    Asthma    Bipolar disorder (HCC)    Chest pain    and angina   CHF (congestive heart failure) (HCC)    Depression    Fibromyalgia    Hypertension    Left lumbar radiculopathy since 08/2014   secondary to work accident   Obesity    Pre-diabetes    Pulmonary embolus (HCC)    Seizures (HCC)    secondary to anxiety  last seizure 01/25/18   SVT (supraventricular tachycardia) (HCC)    with hx of syncope; managed by [facility]    Tobacco Use: Social History   Tobacco Use  Smoking Status Never  Smokeless Tobacco Never    Labs: Recent Review Flowsheet Data     Labs for ITP Cardiac and Pulmonary Rehab Latest Ref Rng & Units 08/30/2017  09/15/2018   Cholestrol 0 - 200 mg/dL 993(Z) -   LDLCALC 0 - 99 mg/dL 169(C) -   HDL >78 mg/dL 72 -   Trlycerides <938 mg/dL 80 -   Hemoglobin B0F 4.8 - 5.6 % - 5.9(H)        Pulmonary Assessment Scores:  Pulmonary Assessment Scores     Row Name 11/14/21 1614         ADL UCSD   SOB  Score total 106     Rest 3     Walk 5     Stairs 4     Bath 3     Dress 5     Shop 5       CAT Score   CAT Score 17       mMRC Score   mMRC Score 4              UCSD: Self-administered rating of dyspnea associated with activities of daily living (ADLs) 6-point scale (0 = "not at all" to 5 = "maximal or unable to do because of breathlessness")  Scoring Scores range from 0 to 120.  Minimally important difference is 5 units  CAT: CAT can identify the health impairment of COPD patients and is better correlated with disease progression.  CAT has a scoring range of zero to 40. The CAT score is classified into four groups of low (less than 10), medium (10 - 20), high (21-30) and very high (31-40) based on the impact level of disease on health status. A CAT score over 10 suggests significant symptoms.  A worsening CAT score could be explained by an exacerbation, poor medication adherence, poor inhaler technique, or progression of COPD or comorbid conditions.  CAT MCID is 2 points  mMRC: mMRC (Modified Medical Research Council) Dyspnea Scale is used to assess the degree of baseline functional disability in patients of respiratory disease due to dyspnea. No minimal important difference is established. A decrease in score of 1 point or greater is considered a positive change.   Pulmonary Function Assessment:   Exercise Target Goals: Exercise Program Goal: Individual exercise prescription set using results from initial 6 min walk test and THRR while considering  patients activity barriers and safety.   Exercise Prescription Goal: Initial exercise prescription builds to 30-45 minutes a day  of aerobic activity, 2-3 days per week.  Home exercise guidelines will be given to patient during program as part of exercise prescription that the participant will acknowledge.  Education: Aerobic Exercise: - Group verbal and visual presentation on the components of exercise prescription. Introduces F.I.T.T principle from ACSM for exercise prescriptions.  Reviews F.I.T.T. principles of aerobic exercise including progression. Written material given at graduation.   Education: Resistance Exercise: - Group verbal and visual presentation on the components of exercise prescription. Introduces F.I.T.T principle from ACSM for exercise prescriptions  Reviews F.I.T.T. principles of resistance exercise including progression. Written material given at graduation.    Education: Exercise & Equipment Safety: - Individual verbal instruction and demonstration of equipment use and safety with use of the equipment. Flowsheet Row Pulmonary Rehab from 11/14/2021 in [facility] Cardiac and Pulmonary Rehab  Date 11/14/21  Educator [facility]  Instruction Review Code 1- Verbalizes Understanding       Education: Exercise Physiology & General Exercise Guidelines: - Group verbal and written instruction with models to review the exercise physiology of the cardiovascular system and associated critical values. Provides general exercise guidelines with specific guidelines to those with heart or lung disease.    Education: Flexibility, Balance, Mind/Body Relaxation: - Group verbal and visual presentation with interactive activity on the components of exercise prescription. Introduces F.I.T.T principle from ACSM for exercise prescriptions. Reviews F.I.T.T. principles of flexibility and balance exercise training including progression. Also discusses the mind body connection.  Reviews various relaxation techniques to help reduce and manage stress (i.e. Deep breathing, progressive muscle relaxation, and visualization). Balance handout  provided to take home. Written material given at graduation.  Activity Barriers & Risk Stratification:  Activity Barriers & Cardiac Risk Stratification - 10/05/21 1121       Activity Barriers & Cardiac Risk Stratification   Activity Barriers Arthritis;Back Problems;History of Falls;Deconditioning;Other (comment);Joint Problems    Comments Back, Right knee, and left ankle hurt daily             6 Minute Walk:  6 Minute Walk     Row Name 11/14/21 1556         6 Minute Walk   Phase Initial     Distance 765 feet     Walk Time 4.75 minutes     # of Rest Breaks 1     MPH 1.83     METS 2.5     RPE 19     Perceived Dyspnea  4     VO2 Peak 8.65     Symptoms Yes (comment)     Comments dizzy/SOB - relieved with rest     Resting HR 88 bpm     Resting BP 144/74     Resting Oxygen Saturation  98 %     Exercise Oxygen Saturation  during 6 min walk 93 %     Max Ex. HR 109 bpm     Max Ex. BP 148/70     2 Minute Post BP 128/74       Interval HR   1 Minute HR 105     2 Minute HR 109     3 Minute HR 98     4 Minute HR 85     5 Minute HR 87     6 Minute HR 109     2 Minute Post HR 91     Interval Heart Rate? Yes       Interval Oxygen   Interval Oxygen? Yes     Baseline Oxygen Saturation % 98 %     1 Minute Oxygen Saturation % 93 %     1 Minute Liters of Oxygen 0 L     2 Minute Oxygen Saturation % 94 %     2 Minute Liters of Oxygen 0 L     3 Minute Oxygen Saturation % 95 %     3 Minute Liters of Oxygen 0 L     4 Minute Oxygen Saturation % 96 %     4 Minute Liters of Oxygen 0 L     5 Minute Oxygen Saturation % 97 %     5 Minute Liters of Oxygen 0 L     6 Minute Oxygen Saturation % 95 %     6 Minute Liters of Oxygen 0 L     2 Minute Post Oxygen Saturation % 95 %     2 Minute Post Liters of Oxygen 0 L             Oxygen Initial Assessment:  Oxygen Initial Assessment - 11/14/21 1637       Home Oxygen   Home Oxygen Device None    Sleep Oxygen Prescription CPAP     Home Exercise Oxygen Prescription None    Home Resting Oxygen Prescription None      Intervention   Short Term Goals To learn and understand importance of monitoring SPO2 with pulse oximeter and demonstrate accurate use of the pulse oximeter.;To learn and demonstrate proper pursed lip breathing techniques or other breathing techniques. ;To learn and understand importance of maintaining oxygen saturations>88%;To learn and demonstrate proper use of respiratory medications  Long  Term Goals Verbalizes importance of monitoring SPO2 with pulse oximeter and return demonstration;Maintenance of O2 saturations>88%;Exhibits proper breathing techniques, such as pursed lip breathing or other method taught during program session;Compliance with respiratory medication;Demonstrates proper use of MDIs             Oxygen Re-Evaluation:   Oxygen Discharge (Final Oxygen Re-Evaluation):   Initial Exercise Prescription:  Initial Exercise Prescription - 11/14/21 1600       Date of Initial Exercise RX and Referring Provider   Date 11/14/21    Referring Provider Agbor-Etang      Oxygen   Maintain Oxygen Saturation 88% or higher      NuStep   Level 1    SPM 80    Minutes 15    METs 2.5      Arm Ergometer   Level 1    RPM 25    Minutes 15    METs 2.5      Biostep-RELP   Level 1    SPM 50    Minutes 15    METs 2      Track   Laps 10    Minutes 15    METs 1.5      Prescription Details   Frequency (times per week) 3    Duration Progress to 30 minutes of continuous aerobic without signs/symptoms of physical distress      Intensity   THRR 40-80% of Max Heartrate 128-168    Ratings of Perceived Exertion 11-15    Perceived Dyspnea 0-4      Resistance Training   Training Prescription Yes    Weight 3 lb    Reps 10-15             Perform Capillary Blood Glucose checks as needed.  Exercise Prescription Changes:   Exercise Prescription Changes     Row Name 11/14/21  1600             Response to Exercise   Blood Pressure (Admit) 144/74       Blood Pressure (Exercise) 148/70       Blood Pressure (Exit) 128/74       Heart Rate (Admit) 88 bpm       Heart Rate (Exercise) 109 bpm       Heart Rate (Exit) 91 bpm       Oxygen Saturation (Admit) 98 %       Oxygen Saturation (Exercise) 93 %       Oxygen Saturation (Exit) 95 %       Rating of Perceived Exertion (Exercise) 19       Perceived Dyspnea (Exercise) 4       Symptoms SOB/dizziness                Exercise Comments:   Exercise Goals and Review:   Exercise Goals     Row Name 11/14/21 1608             Exercise Goals   Increase Physical Activity Yes       Intervention Provide advice, education, support and counseling about physical activity/exercise needs.;Develop an individualized exercise prescription for aerobic and resistive training based on initial evaluation findings, risk stratification, comorbidities and participant's personal goals.       Expected Outcomes Short Term: Attend rehab on a regular basis to increase amount of physical activity.;Long Term: Add in home exercise to make exercise part of routine and to increase amount of physical activity.;Long Term: Exercising regularly at least  3-5 days a week.       Increase Strength and Stamina Yes       Intervention Provide advice, education, support and counseling about physical activity/exercise needs.;Develop an individualized exercise prescription for aerobic and resistive training based on initial evaluation findings, risk stratification, comorbidities and participant's personal goals.       Expected Outcomes Short Term: Increase workloads from initial exercise prescription for resistance, speed, and METs.;Short Term: Perform resistance training exercises routinely during rehab and add in resistance training at home;Long Term: Improve cardiorespiratory fitness, muscular endurance and strength as measured by increased METs and  functional capacity ( )       Able to understand and use rate of perceived exertion (RPE) scale Yes       Intervention Provide education and explanation on how to use RPE scale       Expected Outcomes Short Term: Able to use RPE daily in rehab to express subjective intensity level;Long Term:  Able to use RPE to guide intensity level when exercising independently       Able to understand and use Dyspnea scale Yes       Intervention Provide education and explanation on how to use Dyspnea scale       Expected Outcomes Short Term: Able to use Dyspnea scale daily in rehab to express subjective sense of shortness of breath during exertion;Long Term: Able to use Dyspnea scale to guide intensity level when exercising independently       Knowledge and understanding of Target Heart Rate Range (THRR) Yes       Intervention Provide education and explanation of THRR including how the numbers were predicted and where they are located for reference       Expected Outcomes Short Term: Able to state/look up THRR;Short Term: Able to use daily as guideline for intensity in rehab;Long Term: Able to use THRR to govern intensity when exercising independently       Able to check pulse independently Yes       Intervention Provide education and demonstration on how to check pulse in carotid and radial arteries.;Review the importance of being able to check your own pulse for safety during independent exercise       Expected Outcomes Short Term: Able to explain why pulse checking is important during independent exercise;Long Term: Able to check pulse independently and accurately       Understanding of Exercise Prescription Yes       Intervention Provide education, explanation, and written materials on patient's individual exercise prescription       Expected Outcomes Short Term: Able to explain program exercise prescription;Long Term: Able to explain home exercise prescription to exercise independently                 Exercise Goals Re-Evaluation :   Discharge Exercise Prescription (Final Exercise Prescription Changes):  Exercise Prescription Changes - 11/14/21 1600       Response to Exercise   Blood Pressure (Admit) 144/74    Blood Pressure (Exercise) 148/70    Blood Pressure (Exit) 128/74    Heart Rate (Admit) 88 bpm    Heart Rate (Exercise) 109 bpm    Heart Rate (Exit) 91 bpm    Oxygen Saturation (Admit) 98 %    Oxygen Saturation (Exercise) 93 %    Oxygen Saturation (Exit) 95 %    Rating of Perceived Exertion (Exercise) 19    Perceived Dyspnea (Exercise) 4    Symptoms SOB/dizziness  Nutrition:  Target Goals: Understanding of nutrition guidelines, daily intake of sodium 1500mg , cholesterol 200mg , calories 30% from fat and 7% or less from saturated fats, daily to have 5 or more servings of fruits and vegetables.  Education: All About Nutrition: -Group instruction provided by verbal, written material, interactive activities, discussions, models, and posters to present general guidelines for heart healthy nutrition including fat, fiber, MyPlate, the role of sodium in heart healthy nutrition, utilization of the nutrition label, and utilization of this knowledge for meal planning. Follow up email sent as well. Written material given at graduation. Flowsheet Row Pulmonary Rehab from 11/14/2021 in [facility] Cardiac and Pulmonary Rehab  Education need identified 11/14/21       Biometrics:  Pre Biometrics - 11/14/21 1609       Pre Biometrics   Height 5\' 9"  (1.753 m)    Weight 381 lb 4.8 oz (173 kg)    BMI (Calculated) 56.28              Nutrition Therapy Plan and Nutrition Goals:   Nutrition Assessments:  MEDIFICTS Score Key: ?70 Need to make dietary changes  40-70 Heart Healthy Diet ? 40 Therapeutic Level Cholesterol Diet  Flowsheet Row Pulmonary Rehab from 11/14/2021 in [facility] Cardiac and Pulmonary Rehab  Picture Your Plate Total Score on Admission 57       Picture Your Plate Scores: [facility] Unhealthy dietary pattern with much room for improvement. 41-50 Dietary pattern unlikely to meet recommendations for good health and room for improvement. 51-60 More healthful dietary pattern, with some room for improvement.  >60 Healthy dietary pattern, although there may be some specific behaviors that could be improved.   Nutrition Goals Re-Evaluation:   Nutrition Goals Discharge (Final Nutrition Goals Re-Evaluation):   Psychosocial: Target Goals: Acknowledge presence or absence of significant depression and/or stress, maximize coping skills, provide positive support system. Participant is able to verbalize types and ability to use techniques and skills needed for reducing stress and depression.   Education: Stress, Anxiety, and Depression - Group verbal and visual presentation to define topics covered.  Reviews how body is impacted by stress, anxiety, and depression.  Also discusses healthy ways to reduce stress and to treat/manage anxiety and depression.  Written material given at graduation.   Education: Sleep Hygiene -Provides group verbal and written instruction about how sleep can affect your health.  Define sleep hygiene, discuss sleep cycles and impact of sleep habits. Review good sleep hygiene tips.    Initial Review & Psychosocial Screening:  Initial Psych Review & Screening - 10/05/21 1124       Initial Review   Current issues with Current Depression;Current Anxiety/Panic;Current Sleep Concerns;Current Psychotropic Meds      Family Dynamics   Good Support System? Yes   mother     Barriers   Psychosocial barriers to participate in program There are no identifiable barriers or psychosocial needs.      Screening Interventions   Interventions Encouraged to exercise;Provide feedback about the scores to participant;To provide support and resources with identified psychosocial needs    Expected Outcomes Short Term goal: Utilizing  psychosocial counselor, staff and physician to assist with identification of specific Stressors or current issues interfering with healing process. Setting desired goal for each stressor or current issue identified.;Short Term goal: Identification and review with participant of any Quality of Life or Depression concerns found by scoring the questionnaire.;Long Term Goal: Stressors or current issues are controlled or eliminated.;Long Term goal: The participant improves quality of Life  and PHQ9 Scores as seen by post scores and/or verbalization of changes             Quality of Life Scores:  Scores of 19 and below usually indicate a poorer quality of life in these areas.  A difference of  2-3 points is a clinically meaningful difference.  A difference of 2-3 points in the total score of the Quality of Life Index has been associated with significant improvement in overall quality of life, self-image, physical symptoms, and general health in studies assessing change in quality of life.  PHQ-9: Recent Review Flowsheet Data     Depression screen [facility] 2/9 11/14/2021   Decreased Interest 3   Down, Depressed, Hopeless 3   PHQ - 2 Score 6   Altered sleeping 3   Tired, decreased energy 3   Change in appetite 3   Feeling bad or failure about yourself  3   Trouble concentrating 3   Moving slowly or fidgety/restless 3   Suicidal thoughts 3    PHQ-9 Score 27   Difficult doing work/chores Extremely dIfficult       Interpretation of Total Score  Total Score Depression Severity:  1-4 = Minimal depression, 5-9 = Mild depression, 10-14 = Moderate depression, 15-19 = Moderately severe depression, 20-27 = Severe depression   Psychosocial Evaluation and Intervention:  Psychosocial Evaluation - 10/05/21 1126       Psychosocial Evaluation & Interventions   Interventions Encouraged to exercise with the program and follow exercise prescription;Stress management education;Relaxation education    Comments  Ms. Flaim has multiple health issues including asthma and sleep apnea as well as heart failure. She was diagnosed with Bipolar affective disorder in 2011 and reports personaility changes. She is on medication and in good communication with her doctor to help manage her symptoms. She does not sleep well due to PTSD and medication only works sometimes. She has had an ongoing history of anxiety and depression, with the anxiety causing seizures in the past. She has fallen multiple times in the last year due to light headedness which is brought on by an increased heart rate. She is wanting to try the program to help with her qaulity of life and help manage her symptoms.    Expected Outcomes Short: attend pulmonary rehab for education and exercise. Long: develop and maintain positive self care habits.    Continue Psychosocial Services  Follow up required by staff             Psychosocial Re-Evaluation:   Psychosocial Discharge (Final Psychosocial Re-Evaluation):   Education: Education Goals: Education classes will be provided on a weekly basis, covering required topics. Participant will state understanding/return demonstration of topics presented.  Learning Barriers/Preferences:  Learning Barriers/Preferences - 10/05/21 1124       Learning Barriers/Preferences   Learning Barriers None    Learning Preferences None             General Pulmonary Education Topics:  Infection Prevention: - Provides verbal and written material to individual with discussion of infection control including proper hand washing and proper equipment cleaning during exercise session. Flowsheet Row Pulmonary Rehab from 11/14/2021 in [facility] Cardiac and Pulmonary Rehab  Date 11/14/21  Educator [facility]  Instruction Review Code 1- Verbalizes Understanding       Falls Prevention: - Provides verbal and written material to individual with discussion of falls prevention and safety. Flowsheet Row Pulmonary Rehab from  11/14/2021 in [facility] Cardiac and Pulmonary Rehab  Date 11/14/21  Educator  [facility]  Instruction Review Code 1- Verbalizes Understanding       Chronic Lung Disease Review: - Group verbal instruction with posters, models, PowerPoint presentations and videos,  to review new updates, new respiratory medications, new advancements in procedures and treatments. Providing information on websites and "800" numbers for continued self-education. Includes information about supplement oxygen, available portable oxygen systems, continuous and intermittent flow rates, oxygen safety, concentrators, and Medicare reimbursement for oxygen. Explanation of Pulmonary Drugs, including class, frequency, complications, importance of spacers, rinsing mouth after steroid MDI's, and proper cleaning methods for nebulizers. Review of basic lung anatomy and physiology related to function, structure, and complications of lung disease. Review of risk factors. Discussion about methods for diagnosing sleep apnea and types of masks and machines for OSA. Includes a review of the use of types of environmental controls: home humidity, furnaces, filters, dust mite/pet prevention, HEPA vacuums. Discussion about weather changes, air quality and the benefits of nasal washing. Instruction on Warning signs, infection symptoms, calling MD promptly, preventive modes, and value of vaccinations. Review of effective airway clearance, coughing and/or vibration techniques. Emphasizing that all should Create an Action Plan. Written material given at graduation. Flowsheet Row Pulmonary Rehab from 11/14/2021 in [facility] Cardiac and Pulmonary Rehab  Education need identified 11/14/21       AED/CPR: - Group verbal and written instruction with the use of models to demonstrate the basic use of the AED with the basic ABC's of resuscitation.    Anatomy and Cardiac Procedures: - Group verbal and visual presentation and models provide information about basic cardiac  anatomy and function. Reviews the testing methods done to diagnose heart disease and the outcomes of the test results. Describes the treatment choices: Medical Management, Angioplasty, or Coronary Bypass Surgery for treating various heart conditions including Myocardial Infarction, Angina, Valve Disease, and Cardiac Arrhythmias.  Written material given at graduation.   Medication Safety: - Group verbal and visual instruction to review commonly prescribed medications for heart and lung disease. Reviews the medication, class of the drug, and side effects. Includes the steps to properly store meds and maintain the prescription regimen.  Written material given at graduation.   Other: -Provides group and verbal instruction on various topics (see comments)   Knowledge Questionnaire Score:  Knowledge Questionnaire Score - 11/14/21 1611       Knowledge Questionnaire Score   Pre Score 10/18              Core Components/Risk Factors/Patient Goals at Admission:  Personal Goals and Risk Factors at Admission - 11/14/21 1610       Core Components/Risk Factors/Patient Goals on Admission    Weight Management Yes;Weight Loss    Intervention Weight Management: Provide education and appropriate resources to help participant work on and attain dietary goals.;Weight Management/Obesity: Establish reasonable short term and long term weight goals.;Weight Management: Develop a combined nutrition and exercise program designed to reach desired caloric intake, while maintaining appropriate intake of nutrient and fiber, sodium and fats, and appropriate energy expenditure required for the weight goal.    Expected Outcomes Short Term: Continue to assess and modify interventions until short term weight is achieved;Weight Loss: Understanding of general recommendations for a balanced deficit meal plan, which promotes 1-2 lb weight loss per week and includes a negative energy balance of [phone] kcal/d;Understanding  recommendations for meals to include 15-35% energy as protein, 25-35% energy from fat, 35-60% energy from carbohydrates, less than 200mg  of dietary cholesterol, 20-35 gm of total fiber daily;Understanding of distribution  of calorie intake throughout the day with the consumption of 4-5 meals/snacks;Long Term: Adherence to nutrition and physical activity/exercise program aimed toward attainment of established weight goal    Increase knowledge of respiratory medications and ability to use respiratory devices properly  Yes    Intervention Provide education and demonstration as needed of appropriate use of medications, inhalers, and oxygen therapy.    Expected Outcomes Short Term: Achieves understanding of medications use. Understands that oxygen is a medication prescribed by physician. Demonstrates appropriate use of inhaler and oxygen therapy.    Hypertension Yes    Intervention Provide education on lifestyle modifcations including regular physical activity/exercise, weight management, moderate sodium restriction and increased consumption of fresh fruit, vegetables, and low fat dairy, alcohol moderation, and smoking cessation.;Monitor prescription use compliance.    Expected Outcomes Short Term: Continued assessment and intervention until BP is < 140/57mm HG in hypertensive participants. < 130/66mm HG in hypertensive participants with diabetes, heart failure or chronic kidney disease.;Long Term: Maintenance of blood pressure at goal levels.    Lipids Yes    Intervention Provide education and support for participant on nutrition & aerobic/resistive exercise along with prescribed medications to achieve LDL 70mg , HDL >40mg .    Expected Outcomes Short Term: Participant states understanding of desired cholesterol values and is compliant with medications prescribed. Participant is following exercise prescription and nutrition guidelines.;Long Term: Cholesterol controlled with medications as prescribed, with  individualized exercise RX and with personalized nutrition plan. Value goals: LDL < 70mg , HDL > 40 mg.             Education:Diabetes - Individual verbal and written instruction to review signs/symptoms of diabetes, desired ranges of glucose level fasting, after meals and with exercise. Acknowledge that pre and post exercise glucose checks will be done for 3 sessions at entry of program.   Know Your Numbers and Heart Failure: - Group verbal and visual instruction to discuss disease risk factors for cardiac and pulmonary disease and treatment options.  Reviews associated critical values for Overweight/Obesity, Hypertension, Cholesterol, and Diabetes.  Discusses basics of heart failure: signs/symptoms and treatments.  Introduces Heart Failure Zone chart for action plan for heart failure.  Written material given at graduation.   Core Components/Risk Factors/Patient Goals Review:    Core Components/Risk Factors/Patient Goals at Discharge (Final Review):    ITP Comments:  ITP Comments     Row Name 10/05/21 1116 11/14/21 1620 11/23/21 1122       ITP Comments Initial telephone orientation completed. Diagnosis can be found in [facility] 9/22. EP orientation scheduled for Wednesday 11/23 at 11am. Completed 12/23 and gym orientation. Initial ITP created and sent for review to Dr. , Medical Director. 30 Day review completed. Medical Director ITP review done, changes made as directed, and signed approval by Medical Director.    New to program              Comments:

## 2021-11-28 ENCOUNTER — Encounter: Payer: Self-pay | Admitting: Hematology and Oncology

## 2021-11-30 ENCOUNTER — Ambulatory Visit: Payer: Medicare Other

## 2021-12-01 ENCOUNTER — Telehealth: Payer: Self-pay

## 2021-12-01 NOTE — Telephone Encounter (Signed)
No answer or voice mail

## 2021-12-05 ENCOUNTER — Ambulatory Visit: Payer: Medicare Other

## 2021-12-07 ENCOUNTER — Ambulatory Visit: Payer: Medicare Other

## 2021-12-08 ENCOUNTER — Encounter: Payer: Self-pay | Admitting: Hematology and Oncology

## 2021-12-12 ENCOUNTER — Ambulatory Visit: Payer: Medicare Other

## 2021-12-14 ENCOUNTER — Ambulatory Visit: Payer: Medicare Other

## 2021-12-19 ENCOUNTER — Ambulatory Visit: Payer: Medicare Other

## 2021-12-20 ENCOUNTER — Encounter: Payer: Self-pay | Admitting: *Deleted

## 2021-12-20 DIAGNOSIS — I5032 Chronic diastolic (congestive) heart failure: Secondary | ICD-10-CM

## 2021-12-20 NOTE — Progress Notes (Signed)
Pulmonary Individual Treatment Plan  Patient Details  Name: Andrea Miller MRN: [id number] Date of Birth: 08/02/1986 Referring Provider:   Flowsheet Row Pulmonary Rehab from 11/14/2021 in [facility] Cardiac and Pulmonary Rehab  Referring Provider Agbor-Etang       Initial Encounter Date:  Flowsheet Row Pulmonary Rehab from 11/14/2021 in [facility] Cardiac and Pulmonary Rehab  Date 11/14/21       Visit Diagnosis: Heart failure, diastolic, chronic (HCC)  Patient's Home Medications on Admission:  Current Outpatient Medications:    Adapalene 0.3 % gel, Apply 1 application topically at bedtime., Disp: , Rfl:    albuterol (PROVENTIL) (2.5 MG/3ML) 0.083% nebulizer solution, Take 3 mLs (2.5 mg total) by nebulization every 4 (four) hours as needed for wheezing or shortness of breath., Disp: 360 mL, Rfl: 0   Albuterol Sulfate (PROAIR RESPICLICK) 108 (90 Base) MCG/ACT AEPB, Inhale 2 puffs into the lungs every 6 (six) hours as needed., Disp: 1 each, Rfl: 0   amphetamine-dextroamphetamine (ADDERALL XR) 15 MG 24 hr capsule, TAKE 1 CAPSULE BY MOUTH EACH MORNING, Disp: , Rfl:    budesonide-formoterol (SYMBICORT) 160-4.5 MCG/ACT inhaler, Inhale 2 puffs into the lungs 2 (two) times daily., Disp: 10.2 g, Rfl: 3   budesonide-formoterol (SYMBICORT) 160-4.5 MCG/ACT inhaler, Inhale 2 puffs into the lungs in the morning and at bedtime., Disp: 1 each, Rfl: 6   buprenorphine (BUTRANS) 15 MCG/HR, Place 1 patch onto the skin once a week., Disp: , Rfl:    Calcium-Magnesium 500-250 MG TABS, Take 1 tablet by mouth 2 (two) times daily. , Disp: , Rfl:    cyanocobalamin (,VITAMIN B-12,) 1000 MCG/ML injection, Inject 1 mL (1,000 mcg total) into the muscle every 30 (thirty) days for 12 doses., Disp: 1 mL, Rfl: 11   Dexlansoprazole 30 MG capsule, Take 30 mg by mouth daily., Disp: , Rfl:    diclofenac Sodium (VOLTAREN) 1 % GEL, Apply 2 g topically 4 (four) times daily., Disp: 100 g, Rfl: 0   diltiazem (CARDIZEM CD) 360 MG 24 hr  capsule, Take 360 mg by mouth every morning., Disp: , Rfl:    escitalopram (LEXAPRO) 20 MG tablet, Take 20 mg by mouth daily., Disp: , Rfl:    fluticasone (FLONASE) 50 MCG/ACT nasal spray, Place 2 sprays into both nostrils daily as needed for allergies., Disp: 16 g, Rfl: 1   furosemide (LASIX) 20 MG tablet, Take 40 mg by mouth daily., Disp: , Rfl:    gabapentin (NEURONTIN) 800 MG tablet, Take 800 mg by mouth 3 (three) times daily., Disp: , Rfl:    hydrocortisone 2.5 % cream, Apply to eczema on face 1-2 times a day as needed until rash improved., Disp: 30 g, Rfl: 5   hydroxypropyl methylcellulose / hypromellose (ISOPTO TEARS / GONIOVISC) 2.5 % ophthalmic solution, Place 1 drop into both eyes 3 (three) times daily as needed for dry eyes., Disp: , Rfl:    ipratropium (ATROVENT) 0.06 % nasal spray, Place 2 sprays into both nostrils 4 (four) times daily., Disp: 15 mL, Rfl: 0   isosorbide mononitrate (IMDUR) 30 MG 24 hr tablet, Take 30 mg by mouth daily., Disp: , Rfl:    ketoconazole (NIZORAL) 2 % cream, Apply 1-2 times a day to itchy rash face, scalp, under breast until clear, then prn flares., Disp: 60 g, Rfl: 11   ketoconazole (NIZORAL) 2 % shampoo, Wash scalp 1-2 times a week, let sit 5 minutes and rinse out, Disp: 120 mL, Rfl: 11   lactulose (CHRONULAC) 10 GM/15ML solution, Take  30 g by mouth daily as needed for mild constipation., Disp: , Rfl: 5   lamoTRIgine (LAMICTAL) 200 MG tablet, Take 1 tablet by mouth 2 (two) times a day., Disp: , Rfl:    linaclotide (LINZESS) 290 MCG CAPS capsule, Take 290 mcg by mouth daily before breakfast., Disp: , Rfl:    Lurasidone HCl (LATUDA) 60 MG TABS, Take by mouth., Disp: , Rfl:    mometasone (ELOCON) 0.1 % cream, Apply to more severe areas eczema 1-2 times as directed. May use on face up to 1 week at a time. Avoid groin, underarms., Disp: 45 g, Rfl: 3   montelukast (SINGULAIR) 10 MG tablet, Take 1 tablet (10 mg total) by mouth at bedtime., Disp: 90 tablet, Rfl: 1    pimecrolimus (ELIDEL) 1 % cream, Apply topically to areas on face, body, body folds., Disp: 100 g, Rfl: 5   Potassium Chloride ER 20 MEQ TBCR, Take 20 mEq by mouth 2 (two) times daily. , Disp: , Rfl:    propranolol (INDERAL) 80 MG tablet, Take 80 mg by mouth 3 (three) times daily., Disp: , Rfl:    rosuvastatin (CRESTOR) 20 MG tablet, Take 20 mg by mouth daily., Disp: , Rfl:    SPIRIVA RESPIMAT 1.25 MCG/ACT AERS, INHALE 2 PUFFS INTO THE LUNGS DAILY., Disp: 4 g, Rfl: 0   SYRINGE/NEEDLE, DISP, 1 ML (B-D 1CC SLIP TIP SYR 25GX5/8") 25G X 5/8" 1 ML MISC, Inject 1 mL into the muscle once a week., Disp: 1 each, Rfl: 3   Topiramate ER (TROKENDI XR) 200 MG CP24, Take 1 capsule by mouth daily. , Disp: , Rfl:    tretinoin (RETIN-A) 0.025 % cream, Apply to face nightly as tolerated., Disp: 45 g, Rfl: 5   XARELTO 20 MG TABS tablet, TAKE 1 TABLET BY MOUTH EVERY DAY IN THE MORNING, Disp: 30 tablet, Rfl: 2 No current facility-administered medications for this visit.  Facility-Administered Medications Ordered in Other Visits:    0.9 %  sodium chloride infusion, , Intravenous, Once, Corcoran, Melissa C, MD   iron sucrose (VENOFER) injection 200 mg, 200 mg, Intravenous, Once, Creig Hines, MD  Past Medical History: Past Medical History:  Diagnosis Date   Anxiety    Asthma    Bipolar disorder (HCC)    Chest pain    and angina   CHF (congestive heart failure) (HCC)    Depression    Fibromyalgia    Hypertension    Left lumbar radiculopathy since 08/2014   secondary to work accident   Obesity    Pre-diabetes    Pulmonary embolus (HCC)    Seizures (HCC)    secondary to anxiety  last seizure 01/25/18   SVT (supraventricular tachycardia) (HCC)    with hx of syncope; managed by [facility]    Tobacco Use: Social History   Tobacco Use  Smoking Status Never  Smokeless Tobacco Never    Labs: Recent Review Flowsheet Data     Labs for ITP Cardiac and Pulmonary Rehab Latest Ref Rng & Units 08/30/2017  09/15/2018   Cholestrol 0 - 200 mg/dL 993(Z) -   LDLCALC 0 - 99 mg/dL 169(C) -   HDL >78 mg/dL 72 -   Trlycerides <938 mg/dL 80 -   Hemoglobin B0F 4.8 - 5.6 % - 5.9(H)        Pulmonary Assessment Scores:  Pulmonary Assessment Scores     Row Name 11/14/21 1614         ADL UCSD   SOB  Score total 106     Rest 3     Walk 5     Stairs 4     Bath 3     Dress 5     Shop 5       CAT Score   CAT Score 17       mMRC Score   mMRC Score 4              UCSD: Self-administered rating of dyspnea associated with activities of daily living (ADLs) 6-point scale (0 = "not at all" to 5 = "maximal or unable to do because of breathlessness")  Scoring Scores range from 0 to 120.  Minimally important difference is 5 units  CAT: CAT can identify the health impairment of COPD patients and is better correlated with disease progression.  CAT has a scoring range of zero to 40. The CAT score is classified into four groups of low (less than 10), medium (10 - 20), high (21-30) and very high (31-40) based on the impact level of disease on health status. A CAT score over 10 suggests significant symptoms.  A worsening CAT score could be explained by an exacerbation, poor medication adherence, poor inhaler technique, or progression of COPD or comorbid conditions.  CAT MCID is 2 points  mMRC: mMRC (Modified Medical Research Council) Dyspnea Scale is used to assess the degree of baseline functional disability in patients of respiratory disease due to dyspnea. No minimal important difference is established. A decrease in score of 1 point or greater is considered a positive change.   Pulmonary Function Assessment:   Exercise Target Goals: Exercise Program Goal: Individual exercise prescription set using results from initial 6 min walk test and THRR while considering  patients activity barriers and safety.   Exercise Prescription Goal: Initial exercise prescription builds to 30-45 minutes a day  of aerobic activity, 2-3 days per week.  Home exercise guidelines will be given to patient during program as part of exercise prescription that the participant will acknowledge.  Education: Aerobic Exercise: - Group verbal and visual presentation on the components of exercise prescription. Introduces F.I.T.T principle from ACSM for exercise prescriptions.  Reviews F.I.T.T. principles of aerobic exercise including progression. Written material given at graduation.   Education: Resistance Exercise: - Group verbal and visual presentation on the components of exercise prescription. Introduces F.I.T.T principle from ACSM for exercise prescriptions  Reviews F.I.T.T. principles of resistance exercise including progression. Written material given at graduation.    Education: Exercise & Equipment Safety: - Individual verbal instruction and demonstration of equipment use and safety with use of the equipment. Flowsheet Row Pulmonary Rehab from 11/14/2021 in [facility] Cardiac and Pulmonary Rehab  Date 11/14/21  Educator [facility]  Instruction Review Code 1- Verbalizes Understanding       Education: Exercise Physiology & General Exercise Guidelines: - Group verbal and written instruction with models to review the exercise physiology of the cardiovascular system and associated critical values. Provides general exercise guidelines with specific guidelines to those with heart or lung disease.    Education: Flexibility, Balance, Mind/Body Relaxation: - Group verbal and visual presentation with interactive activity on the components of exercise prescription. Introduces F.I.T.T principle from ACSM for exercise prescriptions. Reviews F.I.T.T. principles of flexibility and balance exercise training including progression. Also discusses the mind body connection.  Reviews various relaxation techniques to help reduce and manage stress (i.e. Deep breathing, progressive muscle relaxation, and visualization). Balance handout  provided to take home. Written material given at graduation.  Activity Barriers & Risk Stratification:  Activity Barriers & Cardiac Risk Stratification - 10/05/21 1121       Activity Barriers & Cardiac Risk Stratification   Activity Barriers Arthritis;Back Problems;History of Falls;Deconditioning;Other (comment);Joint Problems    Comments Back, Right knee, and left ankle hurt daily             6 Minute Walk:  6 Minute Walk     Row Name 11/14/21 1556         6 Minute Walk   Phase Initial     Distance 765 feet     Walk Time 4.75 minutes     # of Rest Breaks 1     MPH 1.83     METS 2.5     RPE 19     Perceived Dyspnea  4     VO2 Peak 8.65     Symptoms Yes (comment)     Comments dizzy/SOB - relieved with rest     Resting HR 88 bpm     Resting BP 144/74     Resting Oxygen Saturation  98 %     Exercise Oxygen Saturation  during 6 min walk 93 %     Max Ex. HR 109 bpm     Max Ex. BP 148/70     2 Minute Post BP 128/74       Interval HR   1 Minute HR 105     2 Minute HR 109     3 Minute HR 98     4 Minute HR 85     5 Minute HR 87     6 Minute HR 109     2 Minute Post HR 91     Interval Heart Rate? Yes       Interval Oxygen   Interval Oxygen? Yes     Baseline Oxygen Saturation % 98 %     1 Minute Oxygen Saturation % 93 %     1 Minute Liters of Oxygen 0 L     2 Minute Oxygen Saturation % 94 %     2 Minute Liters of Oxygen 0 L     3 Minute Oxygen Saturation % 95 %     3 Minute Liters of Oxygen 0 L     4 Minute Oxygen Saturation % 96 %     4 Minute Liters of Oxygen 0 L     5 Minute Oxygen Saturation % 97 %     5 Minute Liters of Oxygen 0 L     6 Minute Oxygen Saturation % 95 %     6 Minute Liters of Oxygen 0 L     2 Minute Post Oxygen Saturation % 95 %     2 Minute Post Liters of Oxygen 0 L             Oxygen Initial Assessment:  Oxygen Initial Assessment - 11/14/21 1637       Home Oxygen   Home Oxygen Device None    Sleep Oxygen Prescription CPAP     Home Exercise Oxygen Prescription None    Home Resting Oxygen Prescription None      Intervention   Short Term Goals To learn and understand importance of monitoring SPO2 with pulse oximeter and demonstrate accurate use of the pulse oximeter.;To learn and demonstrate proper pursed lip breathing techniques or other breathing techniques. ;To learn and understand importance of maintaining oxygen saturations>88%;To learn and demonstrate proper use of respiratory medications  Long  Term Goals Verbalizes importance of monitoring SPO2 with pulse oximeter and return demonstration;Maintenance of O2 saturations>88%;Exhibits proper breathing techniques, such as pursed lip breathing or other method taught during program session;Compliance with respiratory medication;Demonstrates proper use of MDIs             Oxygen Re-Evaluation:   Oxygen Discharge (Final Oxygen Re-Evaluation):   Initial Exercise Prescription:  Initial Exercise Prescription - 11/14/21 1600       Date of Initial Exercise RX and Referring Provider   Date 11/14/21    Referring Provider Agbor-Etang      Oxygen   Maintain Oxygen Saturation 88% or higher      NuStep   Level 1    SPM 80    Minutes 15    METs 2.5      Arm Ergometer   Level 1    RPM 25    Minutes 15    METs 2.5      Biostep-RELP   Level 1    SPM 50    Minutes 15    METs 2      Track   Laps 10    Minutes 15    METs 1.5      Prescription Details   Frequency (times per week) 3    Duration Progress to 30 minutes of continuous aerobic without signs/symptoms of physical distress      Intensity   THRR 40-80% of Max Heartrate 128-168    Ratings of Perceived Exertion 11-15    Perceived Dyspnea 0-4      Resistance Training   Training Prescription Yes    Weight 3 lb    Reps 10-15             Perform Capillary Blood Glucose checks as needed.  Exercise Prescription Changes:   Exercise Prescription Changes     Row Name 11/14/21  1600             Response to Exercise   Blood Pressure (Admit) 144/74       Blood Pressure (Exercise) 148/70       Blood Pressure (Exit) 128/74       Heart Rate (Admit) 88 bpm       Heart Rate (Exercise) 109 bpm       Heart Rate (Exit) 91 bpm       Oxygen Saturation (Admit) 98 %       Oxygen Saturation (Exercise) 93 %       Oxygen Saturation (Exit) 95 %       Rating of Perceived Exertion (Exercise) 19       Perceived Dyspnea (Exercise) 4       Symptoms SOB/dizziness                Exercise Comments:   Exercise Goals and Review:   Exercise Goals     Row Name 11/14/21 1608             Exercise Goals   Increase Physical Activity Yes       Intervention Provide advice, education, support and counseling about physical activity/exercise needs.;Develop an individualized exercise prescription for aerobic and resistive training based on initial evaluation findings, risk stratification, comorbidities and participant's personal goals.       Expected Outcomes Short Term: Attend rehab on a regular basis to increase amount of physical activity.;Long Term: Add in home exercise to make exercise part of routine and to increase amount of physical activity.;Long Term: Exercising regularly at least  3-5 days a week.       Increase Strength and Stamina Yes       Intervention Provide advice, education, support and counseling about physical activity/exercise needs.;Develop an individualized exercise prescription for aerobic and resistive training based on initial evaluation findings, risk stratification, comorbidities and participant's personal goals.       Expected Outcomes Short Term: Increase workloads from initial exercise prescription for resistance, speed, and METs.;Short Term: Perform resistance training exercises routinely during rehab and add in resistance training at home;Long Term: Improve cardiorespiratory fitness, muscular endurance and strength as measured by increased METs and  functional capacity ( )       Able to understand and use rate of perceived exertion (RPE) scale Yes       Intervention Provide education and explanation on how to use RPE scale       Expected Outcomes Short Term: Able to use RPE daily in rehab to express subjective intensity level;Long Term:  Able to use RPE to guide intensity level when exercising independently       Able to understand and use Dyspnea scale Yes       Intervention Provide education and explanation on how to use Dyspnea scale       Expected Outcomes Short Term: Able to use Dyspnea scale daily in rehab to express subjective sense of shortness of breath during exertion;Long Term: Able to use Dyspnea scale to guide intensity level when exercising independently       Knowledge and understanding of Target Heart Rate Range (THRR) Yes       Intervention Provide education and explanation of THRR including how the numbers were predicted and where they are located for reference       Expected Outcomes Short Term: Able to state/look up THRR;Short Term: Able to use daily as guideline for intensity in rehab;Long Term: Able to use THRR to govern intensity when exercising independently       Able to check pulse independently Yes       Intervention Provide education and demonstration on how to check pulse in carotid and radial arteries.;Review the importance of being able to check your own pulse for safety during independent exercise       Expected Outcomes Short Term: Able to explain why pulse checking is important during independent exercise;Long Term: Able to check pulse independently and accurately       Understanding of Exercise Prescription Yes       Intervention Provide education, explanation, and written materials on patient's individual exercise prescription       Expected Outcomes Short Term: Able to explain program exercise prescription;Long Term: Able to explain home exercise prescription to exercise independently                 Exercise Goals Re-Evaluation :   Discharge Exercise Prescription (Final Exercise Prescription Changes):  Exercise Prescription Changes - 11/14/21 1600       Response to Exercise   Blood Pressure (Admit) 144/74    Blood Pressure (Exercise) 148/70    Blood Pressure (Exit) 128/74    Heart Rate (Admit) 88 bpm    Heart Rate (Exercise) 109 bpm    Heart Rate (Exit) 91 bpm    Oxygen Saturation (Admit) 98 %    Oxygen Saturation (Exercise) 93 %    Oxygen Saturation (Exit) 95 %    Rating of Perceived Exertion (Exercise) 19    Perceived Dyspnea (Exercise) 4    Symptoms SOB/dizziness  Nutrition:  Target Goals: Understanding of nutrition guidelines, daily intake of sodium 1500mg , cholesterol 200mg , calories 30% from fat and 7% or less from saturated fats, daily to have 5 or more servings of fruits and vegetables.  Education: All About Nutrition: -Group instruction provided by verbal, written material, interactive activities, discussions, models, and posters to present general guidelines for heart healthy nutrition including fat, fiber, MyPlate, the role of sodium in heart healthy nutrition, utilization of the nutrition label, and utilization of this knowledge for meal planning. Follow up email sent as well. Written material given at graduation. Flowsheet Row Pulmonary Rehab from 11/14/2021 in [facility] Cardiac and Pulmonary Rehab  Education need identified 11/14/21       Biometrics:  Pre Biometrics - 11/14/21 1609       Pre Biometrics   Height 5\' 9"  (1.753 m)    Weight 381 lb 4.8 oz (173 kg)    BMI (Calculated) 56.28              Nutrition Therapy Plan and Nutrition Goals:   Nutrition Assessments:  MEDIFICTS Score Key: ?70 Need to make dietary changes  40-70 Heart Healthy Diet ? 40 Therapeutic Level Cholesterol Diet  Flowsheet Row Pulmonary Rehab from 11/14/2021 in [facility] Cardiac and Pulmonary Rehab  Picture Your Plate Total Score on Admission 57       Picture Your Plate Scores: [facility] Unhealthy dietary pattern with much room for improvement. 41-50 Dietary pattern unlikely to meet recommendations for good health and room for improvement. 51-60 More healthful dietary pattern, with some room for improvement.  >60 Healthy dietary pattern, although there may be some specific behaviors that could be improved.   Nutrition Goals Re-Evaluation:   Nutrition Goals Discharge (Final Nutrition Goals Re-Evaluation):   Psychosocial: Target Goals: Acknowledge presence or absence of significant depression and/or stress, maximize coping skills, provide positive support system. Participant is able to verbalize types and ability to use techniques and skills needed for reducing stress and depression.   Education: Stress, Anxiety, and Depression - Group verbal and visual presentation to define topics covered.  Reviews how body is impacted by stress, anxiety, and depression.  Also discusses healthy ways to reduce stress and to treat/manage anxiety and depression.  Written material given at graduation.   Education: Sleep Hygiene -Provides group verbal and written instruction about how sleep can affect your health.  Define sleep hygiene, discuss sleep cycles and impact of sleep habits. Review good sleep hygiene tips.    Initial Review & Psychosocial Screening:  Initial Psych Review & Screening - 10/05/21 1124       Initial Review   Current issues with Current Depression;Current Anxiety/Panic;Current Sleep Concerns;Current Psychotropic Meds      Family Dynamics   Good Support System? Yes   mother     Barriers   Psychosocial barriers to participate in program There are no identifiable barriers or psychosocial needs.      Screening Interventions   Interventions Encouraged to exercise;Provide feedback about the scores to participant;To provide support and resources with identified psychosocial needs    Expected Outcomes Short Term goal: Utilizing  psychosocial counselor, staff and physician to assist with identification of specific Stressors or current issues interfering with healing process. Setting desired goal for each stressor or current issue identified.;Short Term goal: Identification and review with participant of any Quality of Life or Depression concerns found by scoring the questionnaire.;Long Term Goal: Stressors or current issues are controlled or eliminated.;Long Term goal: The participant improves quality of Life  and PHQ9 Scores as seen by post scores and/or verbalization of changes             Quality of Life Scores:  Scores of 19 and below usually indicate a poorer quality of life in these areas.  A difference of  2-3 points is a clinically meaningful difference.  A difference of 2-3 points in the total score of the Quality of Life Index has been associated with significant improvement in overall quality of life, self-image, physical symptoms, and general health in studies assessing change in quality of life.  PHQ-9: Recent Review Flowsheet Data     Depression screen [facility] 2/9 11/14/2021   Decreased Interest 3   Down, Depressed, Hopeless 3   PHQ - 2 Score 6   Altered sleeping 3   Tired, decreased energy 3   Change in appetite 3   Feeling bad or failure about yourself  3   Trouble concentrating 3   Moving slowly or fidgety/restless 3   Suicidal thoughts 3    PHQ-9 Score 27   Difficult doing work/chores Extremely dIfficult       Interpretation of Total Score  Total Score Depression Severity:  1-4 = Minimal depression, 5-9 = Mild depression, 10-14 = Moderate depression, 15-19 = Moderately severe depression, 20-27 = Severe depression   Psychosocial Evaluation and Intervention:  Psychosocial Evaluation - 10/05/21 1126       Psychosocial Evaluation & Interventions   Interventions Encouraged to exercise with the program and follow exercise prescription;Stress management education;Relaxation education    Comments  Ms. Flaim has multiple health issues including asthma and sleep apnea as well as heart failure. She was diagnosed with Bipolar affective disorder in 2011 and reports personaility changes. She is on medication and in good communication with her doctor to help manage her symptoms. She does not sleep well due to PTSD and medication only works sometimes. She has had an ongoing history of anxiety and depression, with the anxiety causing seizures in the past. She has fallen multiple times in the last year due to light headedness which is brought on by an increased heart rate. She is wanting to try the program to help with her qaulity of life and help manage her symptoms.    Expected Outcomes Short: attend pulmonary rehab for education and exercise. Long: develop and maintain positive self care habits.    Continue Psychosocial Services  Follow up required by staff             Psychosocial Re-Evaluation:   Psychosocial Discharge (Final Psychosocial Re-Evaluation):   Education: Education Goals: Education classes will be provided on a weekly basis, covering required topics. Participant will state understanding/return demonstration of topics presented.  Learning Barriers/Preferences:  Learning Barriers/Preferences - 10/05/21 1124       Learning Barriers/Preferences   Learning Barriers None    Learning Preferences None             General Pulmonary Education Topics:  Infection Prevention: - Provides verbal and written material to individual with discussion of infection control including proper hand washing and proper equipment cleaning during exercise session. Flowsheet Row Pulmonary Rehab from 11/14/2021 in [facility] Cardiac and Pulmonary Rehab  Date 11/14/21  Educator [facility]  Instruction Review Code 1- Verbalizes Understanding       Falls Prevention: - Provides verbal and written material to individual with discussion of falls prevention and safety. Flowsheet Row Pulmonary Rehab from  11/14/2021 in [facility] Cardiac and Pulmonary Rehab  Date 11/14/21  Educator  [facility]  Instruction Review Code 1- Verbalizes Understanding       Chronic Lung Disease Review: - Group verbal instruction with posters, models, PowerPoint presentations and videos,  to review new updates, new respiratory medications, new advancements in procedures and treatments. Providing information on websites and "800" numbers for continued self-education. Includes information about supplement oxygen, available portable oxygen systems, continuous and intermittent flow rates, oxygen safety, concentrators, and Medicare reimbursement for oxygen. Explanation of Pulmonary Drugs, including class, frequency, complications, importance of spacers, rinsing mouth after steroid MDI's, and proper cleaning methods for nebulizers. Review of basic lung anatomy and physiology related to function, structure, and complications of lung disease. Review of risk factors. Discussion about methods for diagnosing sleep apnea and types of masks and machines for OSA. Includes a review of the use of types of environmental controls: home humidity, furnaces, filters, dust mite/pet prevention, HEPA vacuums. Discussion about weather changes, air quality and the benefits of nasal washing. Instruction on Warning signs, infection symptoms, calling MD promptly, preventive modes, and value of vaccinations. Review of effective airway clearance, coughing and/or vibration techniques. Emphasizing that all should Create an Action Plan. Written material given at graduation. Flowsheet Row Pulmonary Rehab from 11/14/2021 in [facility] Cardiac and Pulmonary Rehab  Education need identified 11/14/21       AED/CPR: - Group verbal and written instruction with the use of models to demonstrate the basic use of the AED with the basic ABC's of resuscitation.    Anatomy and Cardiac Procedures: - Group verbal and visual presentation and models provide information about basic cardiac  anatomy and function. Reviews the testing methods done to diagnose heart disease and the outcomes of the test results. Describes the treatment choices: Medical Management, Angioplasty, or Coronary Bypass Surgery for treating various heart conditions including Myocardial Infarction, Angina, Valve Disease, and Cardiac Arrhythmias.  Written material given at graduation.   Medication Safety: - Group verbal and visual instruction to review commonly prescribed medications for heart and lung disease. Reviews the medication, class of the drug, and side effects. Includes the steps to properly store meds and maintain the prescription regimen.  Written material given at graduation.   Other: -Provides group and verbal instruction on various topics (see comments)   Knowledge Questionnaire Score:  Knowledge Questionnaire Score - 11/14/21 1611       Knowledge Questionnaire Score   Pre Score 10/18              Core Components/Risk Factors/Patient Goals at Admission:  Personal Goals and Risk Factors at Admission - 11/14/21 1610       Core Components/Risk Factors/Patient Goals on Admission    Weight Management Yes;Weight Loss    Intervention Weight Management: Provide education and appropriate resources to help participant work on and attain dietary goals.;Weight Management/Obesity: Establish reasonable short term and long term weight goals.;Weight Management: Develop a combined nutrition and exercise program designed to reach desired caloric intake, while maintaining appropriate intake of nutrient and fiber, sodium and fats, and appropriate energy expenditure required for the weight goal.    Expected Outcomes Short Term: Continue to assess and modify interventions until short term weight is achieved;Weight Loss: Understanding of general recommendations for a balanced deficit meal plan, which promotes 1-2 lb weight loss per week and includes a negative energy balance of [phone] kcal/d;Understanding  recommendations for meals to include 15-35% energy as protein, 25-35% energy from fat, 35-60% energy from carbohydrates, less than 200mg  of dietary cholesterol, 20-35 gm of total fiber daily;Understanding of distribution  of calorie intake throughout the day with the consumption of 4-5 meals/snacks;Long Term: Adherence to nutrition and physical activity/exercise program aimed toward attainment of established weight goal    Increase knowledge of respiratory medications and ability to use respiratory devices properly  Yes    Intervention Provide education and demonstration as needed of appropriate use of medications, inhalers, and oxygen therapy.    Expected Outcomes Short Term: Achieves understanding of medications use. Understands that oxygen is a medication prescribed by physician. Demonstrates appropriate use of inhaler and oxygen therapy.    Hypertension Yes    Intervention Provide education on lifestyle modifcations including regular physical activity/exercise, weight management, moderate sodium restriction and increased consumption of fresh fruit, vegetables, and low fat dairy, alcohol moderation, and smoking cessation.;Monitor prescription use compliance.    Expected Outcomes Short Term: Continued assessment and intervention until BP is < 140/66mm HG in hypertensive participants. < 130/58mm HG in hypertensive participants with diabetes, heart failure or chronic kidney disease.;Long Term: Maintenance of blood pressure at goal levels.    Lipids Yes    Intervention Provide education and support for participant on nutrition & aerobic/resistive exercise along with prescribed medications to achieve LDL 70mg , HDL >40mg .    Expected Outcomes Short Term: Participant states understanding of desired cholesterol values and is compliant with medications prescribed. Participant is following exercise prescription and nutrition guidelines.;Long Term: Cholesterol controlled with medications as prescribed, with  individualized exercise RX and with personalized nutrition plan. Value goals: LDL < 70mg , HDL > 40 mg.             Education:Diabetes - Individual verbal and written instruction to review signs/symptoms of diabetes, desired ranges of glucose level fasting, after meals and with exercise. Acknowledge that pre and post exercise glucose checks will be done for 3 sessions at entry of program.   Know Your Numbers and Heart Failure: - Group verbal and visual instruction to discuss disease risk factors for cardiac and pulmonary disease and treatment options.  Reviews associated critical values for Overweight/Obesity, Hypertension, Cholesterol, and Diabetes.  Discusses basics of heart failure: signs/symptoms and treatments.  Introduces Heart Failure Zone chart for action plan for heart failure.  Written material given at graduation.   Core Components/Risk Factors/Patient Goals Review:    Core Components/Risk Factors/Patient Goals at Discharge (Final Review):    ITP Comments:  ITP Comments     Row Name 10/05/21 1116 11/14/21 1620 11/23/21 1122 12/20/21 0804     ITP Comments Initial telephone orientation completed. Diagnosis can be found in [facility] 9/22. EP orientation scheduled for Wednesday 11/23 at 11am. Completed 12/23 and gym orientation. Initial ITP created and sent for review to Dr. , Medical Director. 30 Day review completed. Medical Director ITP review done, changes made as directed, and signed approval by Medical Director.    New to program Pt never returned after orientation nor returned contact attempts.  We will discharge at this time.             Comments: Discharge ITP

## 2021-12-21 ENCOUNTER — Ambulatory Visit: Payer: Medicare Other

## 2021-12-21 ENCOUNTER — Encounter: Payer: Self-pay | Admitting: *Deleted

## 2021-12-21 DIAGNOSIS — I5032 Chronic diastolic (congestive) heart failure: Secondary | ICD-10-CM

## 2021-12-21 NOTE — Progress Notes (Signed)
Pulmonary Individual Treatment Plan  Patient Details  Name: Andrea Miller MRN: [id number] Date of Birth: 08/02/1986 Referring Provider:   Flowsheet Row Pulmonary Rehab from 11/14/2021 in [facility] Cardiac and Pulmonary Rehab  Referring Provider Agbor-Etang       Initial Encounter Date:  Flowsheet Row Pulmonary Rehab from 11/14/2021 in [facility] Cardiac and Pulmonary Rehab  Date 11/14/21       Visit Diagnosis: Heart failure, diastolic, chronic (HCC)  Patient's Home Medications on Admission:  Current Outpatient Medications:    Adapalene 0.3 % gel, Apply 1 application topically at bedtime., Disp: , Rfl:    albuterol (PROVENTIL) (2.5 MG/3ML) 0.083% nebulizer solution, Take 3 mLs (2.5 mg total) by nebulization every 4 (four) hours as needed for wheezing or shortness of breath., Disp: 360 mL, Rfl: 0   Albuterol Sulfate (PROAIR RESPICLICK) 108 (90 Base) MCG/ACT AEPB, Inhale 2 puffs into the lungs every 6 (six) hours as needed., Disp: 1 each, Rfl: 0   amphetamine-dextroamphetamine (ADDERALL XR) 15 MG 24 hr capsule, TAKE 1 CAPSULE BY MOUTH EACH MORNING, Disp: , Rfl:    budesonide-formoterol (SYMBICORT) 160-4.5 MCG/ACT inhaler, Inhale 2 puffs into the lungs 2 (two) times daily., Disp: 10.2 g, Rfl: 3   budesonide-formoterol (SYMBICORT) 160-4.5 MCG/ACT inhaler, Inhale 2 puffs into the lungs in the morning and at bedtime., Disp: 1 each, Rfl: 6   buprenorphine (BUTRANS) 15 MCG/HR, Place 1 patch onto the skin once a week., Disp: , Rfl:    Calcium-Magnesium 500-250 MG TABS, Take 1 tablet by mouth 2 (two) times daily. , Disp: , Rfl:    cyanocobalamin (,VITAMIN B-12,) 1000 MCG/ML injection, Inject 1 mL (1,000 mcg total) into the muscle every 30 (thirty) days for 12 doses., Disp: 1 mL, Rfl: 11   Dexlansoprazole 30 MG capsule, Take 30 mg by mouth daily., Disp: , Rfl:    diclofenac Sodium (VOLTAREN) 1 % GEL, Apply 2 g topically 4 (four) times daily., Disp: 100 g, Rfl: 0   diltiazem (CARDIZEM CD) 360 MG 24 hr  capsule, Take 360 mg by mouth every morning., Disp: , Rfl:    escitalopram (LEXAPRO) 20 MG tablet, Take 20 mg by mouth daily., Disp: , Rfl:    fluticasone (FLONASE) 50 MCG/ACT nasal spray, Place 2 sprays into both nostrils daily as needed for allergies., Disp: 16 g, Rfl: 1   furosemide (LASIX) 20 MG tablet, Take 40 mg by mouth daily., Disp: , Rfl:    gabapentin (NEURONTIN) 800 MG tablet, Take 800 mg by mouth 3 (three) times daily., Disp: , Rfl:    hydrocortisone 2.5 % cream, Apply to eczema on face 1-2 times a day as needed until rash improved., Disp: 30 g, Rfl: 5   hydroxypropyl methylcellulose / hypromellose (ISOPTO TEARS / GONIOVISC) 2.5 % ophthalmic solution, Place 1 drop into both eyes 3 (three) times daily as needed for dry eyes., Disp: , Rfl:    ipratropium (ATROVENT) 0.06 % nasal spray, Place 2 sprays into both nostrils 4 (four) times daily., Disp: 15 mL, Rfl: 0   isosorbide mononitrate (IMDUR) 30 MG 24 hr tablet, Take 30 mg by mouth daily., Disp: , Rfl:    ketoconazole (NIZORAL) 2 % cream, Apply 1-2 times a day to itchy rash face, scalp, under breast until clear, then prn flares., Disp: 60 g, Rfl: 11   ketoconazole (NIZORAL) 2 % shampoo, Wash scalp 1-2 times a week, let sit 5 minutes and rinse out, Disp: 120 mL, Rfl: 11   lactulose (CHRONULAC) 10 GM/15ML solution, Take  30 g by mouth daily as needed for mild constipation., Disp: , Rfl: 5   lamoTRIgine (LAMICTAL) 200 MG tablet, Take 1 tablet by mouth 2 (two) times a day., Disp: , Rfl:    linaclotide (LINZESS) 290 MCG CAPS capsule, Take 290 mcg by mouth daily before breakfast., Disp: , Rfl:    Lurasidone HCl (LATUDA) 60 MG TABS, Take by mouth., Disp: , Rfl:    mometasone (ELOCON) 0.1 % cream, Apply to more severe areas eczema 1-2 times as directed. May use on face up to 1 week at a time. Avoid groin, underarms., Disp: 45 g, Rfl: 3   montelukast (SINGULAIR) 10 MG tablet, Take 1 tablet (10 mg total) by mouth at bedtime., Disp: 90 tablet, Rfl: 1    pimecrolimus (ELIDEL) 1 % cream, Apply topically to areas on face, body, body folds., Disp: 100 g, Rfl: 5   Potassium Chloride ER 20 MEQ TBCR, Take 20 mEq by mouth 2 (two) times daily. , Disp: , Rfl:    propranolol (INDERAL) 80 MG tablet, Take 80 mg by mouth 3 (three) times daily., Disp: , Rfl:    rosuvastatin (CRESTOR) 20 MG tablet, Take 20 mg by mouth daily., Disp: , Rfl:    SPIRIVA RESPIMAT 1.25 MCG/ACT AERS, INHALE 2 PUFFS INTO THE LUNGS DAILY., Disp: 4 g, Rfl: 0   SYRINGE/NEEDLE, DISP, 1 ML (B-D 1CC SLIP TIP SYR 25GX5/8") 25G X 5/8" 1 ML MISC, Inject 1 mL into the muscle once a week., Disp: 1 each, Rfl: 3   Topiramate ER (TROKENDI XR) 200 MG CP24, Take 1 capsule by mouth daily. , Disp: , Rfl:    tretinoin (RETIN-A) 0.025 % cream, Apply to face nightly as tolerated., Disp: 45 g, Rfl: 5   XARELTO 20 MG TABS tablet, TAKE 1 TABLET BY MOUTH EVERY DAY IN THE MORNING, Disp: 30 tablet, Rfl: 2 No current facility-administered medications for this visit.  Facility-Administered Medications Ordered in Other Visits:    0.9 %  sodium chloride infusion, , Intravenous, Once, Corcoran, Melissa C, MD   iron sucrose (VENOFER) injection 200 mg, 200 mg, Intravenous, Once, Creig Hines, MD  Past Medical History: Past Medical History:  Diagnosis Date   Anxiety    Asthma    Bipolar disorder (HCC)    Chest pain    and angina   CHF (congestive heart failure) (HCC)    Depression    Fibromyalgia    Hypertension    Left lumbar radiculopathy since 08/2014   secondary to work accident   Obesity    Pre-diabetes    Pulmonary embolus (HCC)    Seizures (HCC)    secondary to anxiety  last seizure 01/25/18   SVT (supraventricular tachycardia) (HCC)    with hx of syncope; managed by [facility]    Tobacco Use: Social History   Tobacco Use  Smoking Status Never  Smokeless Tobacco Never    Labs: Recent Review Flowsheet Data     Labs for ITP Cardiac and Pulmonary Rehab Latest Ref Rng & Units 08/30/2017  09/15/2018   Cholestrol 0 - 200 mg/dL 993(Z) -   LDLCALC 0 - 99 mg/dL 169(C) -   HDL >78 mg/dL 72 -   Trlycerides <938 mg/dL 80 -   Hemoglobin B0F 4.8 - 5.6 % - 5.9(H)        Pulmonary Assessment Scores:  Pulmonary Assessment Scores     Row Name 11/14/21 1614         ADL UCSD   SOB  Score total 106     Rest 3     Walk 5     Stairs 4     Bath 3     Dress 5     Shop 5       CAT Score   CAT Score 17       mMRC Score   mMRC Score 4              UCSD: Self-administered rating of dyspnea associated with activities of daily living (ADLs) 6-point scale (0 = "not at all" to 5 = "maximal or unable to do because of breathlessness")  Scoring Scores range from 0 to 120.  Minimally important difference is 5 units  CAT: CAT can identify the health impairment of COPD patients and is better correlated with disease progression.  CAT has a scoring range of zero to 40. The CAT score is classified into four groups of low (less than 10), medium (10 - 20), high (21-30) and very high (31-40) based on the impact level of disease on health status. A CAT score over 10 suggests significant symptoms.  A worsening CAT score could be explained by an exacerbation, poor medication adherence, poor inhaler technique, or progression of COPD or comorbid conditions.  CAT MCID is 2 points  mMRC: mMRC (Modified Medical Research Council) Dyspnea Scale is used to assess the degree of baseline functional disability in patients of respiratory disease due to dyspnea. No minimal important difference is established. A decrease in score of 1 point or greater is considered a positive change.   Pulmonary Function Assessment:   Exercise Target Goals: Exercise Program Goal: Individual exercise prescription set using results from initial 6 min walk test and THRR while considering  patients activity barriers and safety.   Exercise Prescription Goal: Initial exercise prescription builds to 30-45 minutes a day  of aerobic activity, 2-3 days per week.  Home exercise guidelines will be given to patient during program as part of exercise prescription that the participant will acknowledge.  Education: Aerobic Exercise: - Group verbal and visual presentation on the components of exercise prescription. Introduces F.I.T.T principle from ACSM for exercise prescriptions.  Reviews F.I.T.T. principles of aerobic exercise including progression. Written material given at graduation.   Education: Resistance Exercise: - Group verbal and visual presentation on the components of exercise prescription. Introduces F.I.T.T principle from ACSM for exercise prescriptions  Reviews F.I.T.T. principles of resistance exercise including progression. Written material given at graduation.    Education: Exercise & Equipment Safety: - Individual verbal instruction and demonstration of equipment use and safety with use of the equipment. Flowsheet Row Pulmonary Rehab from 11/14/2021 in [facility] Cardiac and Pulmonary Rehab  Date 11/14/21  Educator [facility]  Instruction Review Code 1- Verbalizes Understanding       Education: Exercise Physiology & General Exercise Guidelines: - Group verbal and written instruction with models to review the exercise physiology of the cardiovascular system and associated critical values. Provides general exercise guidelines with specific guidelines to those with heart or lung disease.    Education: Flexibility, Balance, Mind/Body Relaxation: - Group verbal and visual presentation with interactive activity on the components of exercise prescription. Introduces F.I.T.T principle from ACSM for exercise prescriptions. Reviews F.I.T.T. principles of flexibility and balance exercise training including progression. Also discusses the mind body connection.  Reviews various relaxation techniques to help reduce and manage stress (i.e. Deep breathing, progressive muscle relaxation, and visualization). Balance handout  provided to take home. Written material given at graduation.  Activity Barriers & Risk Stratification:  Activity Barriers & Cardiac Risk Stratification - 10/05/21 1121       Activity Barriers & Cardiac Risk Stratification   Activity Barriers Arthritis;Back Problems;History of Falls;Deconditioning;Other (comment);Joint Problems    Comments Back, Right knee, and left ankle hurt daily             6 Minute Walk:  6 Minute Walk     Row Name 11/14/21 1556         6 Minute Walk   Phase Initial     Distance 765 feet     Walk Time 4.75 minutes     # of Rest Breaks 1     MPH 1.83     METS 2.5     RPE 19     Perceived Dyspnea  4     VO2 Peak 8.65     Symptoms Yes (comment)     Comments dizzy/SOB - relieved with rest     Resting HR 88 bpm     Resting BP 144/74     Resting Oxygen Saturation  98 %     Exercise Oxygen Saturation  during 6 min walk 93 %     Max Ex. HR 109 bpm     Max Ex. BP 148/70     2 Minute Post BP 128/74       Interval HR   1 Minute HR 105     2 Minute HR 109     3 Minute HR 98     4 Minute HR 85     5 Minute HR 87     6 Minute HR 109     2 Minute Post HR 91     Interval Heart Rate? Yes       Interval Oxygen   Interval Oxygen? Yes     Baseline Oxygen Saturation % 98 %     1 Minute Oxygen Saturation % 93 %     1 Minute Liters of Oxygen 0 L     2 Minute Oxygen Saturation % 94 %     2 Minute Liters of Oxygen 0 L     3 Minute Oxygen Saturation % 95 %     3 Minute Liters of Oxygen 0 L     4 Minute Oxygen Saturation % 96 %     4 Minute Liters of Oxygen 0 L     5 Minute Oxygen Saturation % 97 %     5 Minute Liters of Oxygen 0 L     6 Minute Oxygen Saturation % 95 %     6 Minute Liters of Oxygen 0 L     2 Minute Post Oxygen Saturation % 95 %     2 Minute Post Liters of Oxygen 0 L             Oxygen Initial Assessment:  Oxygen Initial Assessment - 11/14/21 1637       Home Oxygen   Home Oxygen Device None    Sleep Oxygen Prescription CPAP     Home Exercise Oxygen Prescription None    Home Resting Oxygen Prescription None      Intervention   Short Term Goals To learn and understand importance of monitoring SPO2 with pulse oximeter and demonstrate accurate use of the pulse oximeter.;To learn and demonstrate proper pursed lip breathing techniques or other breathing techniques. ;To learn and understand importance of maintaining oxygen saturations>88%;To learn and demonstrate proper use of respiratory medications  Long  Term Goals Verbalizes importance of monitoring SPO2 with pulse oximeter and return demonstration;Maintenance of O2 saturations>88%;Exhibits proper breathing techniques, such as pursed lip breathing or other method taught during program session;Compliance with respiratory medication;Demonstrates proper use of MDIs             Oxygen Re-Evaluation:   Oxygen Discharge (Final Oxygen Re-Evaluation):   Initial Exercise Prescription:  Initial Exercise Prescription - 11/14/21 1600       Date of Initial Exercise RX and Referring Provider   Date 11/14/21    Referring Provider Agbor-Etang      Oxygen   Maintain Oxygen Saturation 88% or higher      NuStep   Level 1    SPM 80    Minutes 15    METs 2.5      Arm Ergometer   Level 1    RPM 25    Minutes 15    METs 2.5      Biostep-RELP   Level 1    SPM 50    Minutes 15    METs 2      Track   Laps 10    Minutes 15    METs 1.5      Prescription Details   Frequency (times per week) 3    Duration Progress to 30 minutes of continuous aerobic without signs/symptoms of physical distress      Intensity   THRR 40-80% of Max Heartrate 128-168    Ratings of Perceived Exertion 11-15    Perceived Dyspnea 0-4      Resistance Training   Training Prescription Yes    Weight 3 lb    Reps 10-15             Perform Capillary Blood Glucose checks as needed.  Exercise Prescription Changes:   Exercise Prescription Changes     Row Name 11/14/21  1600             Response to Exercise   Blood Pressure (Admit) 144/74       Blood Pressure (Exercise) 148/70       Blood Pressure (Exit) 128/74       Heart Rate (Admit) 88 bpm       Heart Rate (Exercise) 109 bpm       Heart Rate (Exit) 91 bpm       Oxygen Saturation (Admit) 98 %       Oxygen Saturation (Exercise) 93 %       Oxygen Saturation (Exit) 95 %       Rating of Perceived Exertion (Exercise) 19       Perceived Dyspnea (Exercise) 4       Symptoms SOB/dizziness                Exercise Comments:   Exercise Goals and Review:   Exercise Goals     Row Name 11/14/21 1608             Exercise Goals   Increase Physical Activity Yes       Intervention Provide advice, education, support and counseling about physical activity/exercise needs.;Develop an individualized exercise prescription for aerobic and resistive training based on initial evaluation findings, risk stratification, comorbidities and participant's personal goals.       Expected Outcomes Short Term: Attend rehab on a regular basis to increase amount of physical activity.;Long Term: Add in home exercise to make exercise part of routine and to increase amount of physical activity.;Long Term: Exercising regularly at least  3-5 days a week.       Increase Strength and Stamina Yes       Intervention Provide advice, education, support and counseling about physical activity/exercise needs.;Develop an individualized exercise prescription for aerobic and resistive training based on initial evaluation findings, risk stratification, comorbidities and participant's personal goals.       Expected Outcomes Short Term: Increase workloads from initial exercise prescription for resistance, speed, and METs.;Short Term: Perform resistance training exercises routinely during rehab and add in resistance training at home;Long Term: Improve cardiorespiratory fitness, muscular endurance and strength as measured by increased METs and  functional capacity ( )       Able to understand and use rate of perceived exertion (RPE) scale Yes       Intervention Provide education and explanation on how to use RPE scale       Expected Outcomes Short Term: Able to use RPE daily in rehab to express subjective intensity level;Long Term:  Able to use RPE to guide intensity level when exercising independently       Able to understand and use Dyspnea scale Yes       Intervention Provide education and explanation on how to use Dyspnea scale       Expected Outcomes Short Term: Able to use Dyspnea scale daily in rehab to express subjective sense of shortness of breath during exertion;Long Term: Able to use Dyspnea scale to guide intensity level when exercising independently       Knowledge and understanding of Target Heart Rate Range (THRR) Yes       Intervention Provide education and explanation of THRR including how the numbers were predicted and where they are located for reference       Expected Outcomes Short Term: Able to state/look up THRR;Short Term: Able to use daily as guideline for intensity in rehab;Long Term: Able to use THRR to govern intensity when exercising independently       Able to check pulse independently Yes       Intervention Provide education and demonstration on how to check pulse in carotid and radial arteries.;Review the importance of being able to check your own pulse for safety during independent exercise       Expected Outcomes Short Term: Able to explain why pulse checking is important during independent exercise;Long Term: Able to check pulse independently and accurately       Understanding of Exercise Prescription Yes       Intervention Provide education, explanation, and written materials on patient's individual exercise prescription       Expected Outcomes Short Term: Able to explain program exercise prescription;Long Term: Able to explain home exercise prescription to exercise independently                 Exercise Goals Re-Evaluation :   Discharge Exercise Prescription (Final Exercise Prescription Changes):  Exercise Prescription Changes - 11/14/21 1600       Response to Exercise   Blood Pressure (Admit) 144/74    Blood Pressure (Exercise) 148/70    Blood Pressure (Exit) 128/74    Heart Rate (Admit) 88 bpm    Heart Rate (Exercise) 109 bpm    Heart Rate (Exit) 91 bpm    Oxygen Saturation (Admit) 98 %    Oxygen Saturation (Exercise) 93 %    Oxygen Saturation (Exit) 95 %    Rating of Perceived Exertion (Exercise) 19    Perceived Dyspnea (Exercise) 4    Symptoms SOB/dizziness  Nutrition:  Target Goals: Understanding of nutrition guidelines, daily intake of sodium 1500mg , cholesterol 200mg , calories 30% from fat and 7% or less from saturated fats, daily to have 5 or more servings of fruits and vegetables.  Education: All About Nutrition: -Group instruction provided by verbal, written material, interactive activities, discussions, models, and posters to present general guidelines for heart healthy nutrition including fat, fiber, MyPlate, the role of sodium in heart healthy nutrition, utilization of the nutrition label, and utilization of this knowledge for meal planning. Follow up email sent as well. Written material given at graduation. Flowsheet Row Pulmonary Rehab from 11/14/2021 in [facility] Cardiac and Pulmonary Rehab  Education need identified 11/14/21       Biometrics:  Pre Biometrics - 11/14/21 1609       Pre Biometrics   Height 5\' 9"  (1.753 m)    Weight 381 lb 4.8 oz (173 kg)    BMI (Calculated) 56.28              Nutrition Therapy Plan and Nutrition Goals:   Nutrition Assessments:  MEDIFICTS Score Key: ?70 Need to make dietary changes  40-70 Heart Healthy Diet ? 40 Therapeutic Level Cholesterol Diet  Flowsheet Row Pulmonary Rehab from 11/14/2021 in [facility] Cardiac and Pulmonary Rehab  Picture Your Plate Total Score on Admission 57       Picture Your Plate Scores: [facility] Unhealthy dietary pattern with much room for improvement. 41-50 Dietary pattern unlikely to meet recommendations for good health and room for improvement. 51-60 More healthful dietary pattern, with some room for improvement.  >60 Healthy dietary pattern, although there may be some specific behaviors that could be improved.   Nutrition Goals Re-Evaluation:   Nutrition Goals Discharge (Final Nutrition Goals Re-Evaluation):   Psychosocial: Target Goals: Acknowledge presence or absence of significant depression and/or stress, maximize coping skills, provide positive support system. Participant is able to verbalize types and ability to use techniques and skills needed for reducing stress and depression.   Education: Stress, Anxiety, and Depression - Group verbal and visual presentation to define topics covered.  Reviews how body is impacted by stress, anxiety, and depression.  Also discusses healthy ways to reduce stress and to treat/manage anxiety and depression.  Written material given at graduation.   Education: Sleep Hygiene -Provides group verbal and written instruction about how sleep can affect your health.  Define sleep hygiene, discuss sleep cycles and impact of sleep habits. Review good sleep hygiene tips.    Initial Review & Psychosocial Screening:  Initial Psych Review & Screening - 10/05/21 1124       Initial Review   Current issues with Current Depression;Current Anxiety/Panic;Current Sleep Concerns;Current Psychotropic Meds      Family Dynamics   Good Support System? Yes   mother     Barriers   Psychosocial barriers to participate in program There are no identifiable barriers or psychosocial needs.      Screening Interventions   Interventions Encouraged to exercise;Provide feedback about the scores to participant;To provide support and resources with identified psychosocial needs    Expected Outcomes Short Term goal: Utilizing  psychosocial counselor, staff and physician to assist with identification of specific Stressors or current issues interfering with healing process. Setting desired goal for each stressor or current issue identified.;Short Term goal: Identification and review with participant of any Quality of Life or Depression concerns found by scoring the questionnaire.;Long Term Goal: Stressors or current issues are controlled or eliminated.;Long Term goal: The participant improves quality of Life  and PHQ9 Scores as seen by post scores and/or verbalization of changes             Quality of Life Scores:  Scores of 19 and below usually indicate a poorer quality of life in these areas.  A difference of  2-3 points is a clinically meaningful difference.  A difference of 2-3 points in the total score of the Quality of Life Index has been associated with significant improvement in overall quality of life, self-image, physical symptoms, and general health in studies assessing change in quality of life.  PHQ-9: Recent Review Flowsheet Data     Depression screen [facility] 2/9 11/14/2021   Decreased Interest 3   Down, Depressed, Hopeless 3   PHQ - 2 Score 6   Altered sleeping 3   Tired, decreased energy 3   Change in appetite 3   Feeling bad or failure about yourself  3   Trouble concentrating 3   Moving slowly or fidgety/restless 3   Suicidal thoughts 3    PHQ-9 Score 27   Difficult doing work/chores Extremely dIfficult       Interpretation of Total Score  Total Score Depression Severity:  1-4 = Minimal depression, 5-9 = Mild depression, 10-14 = Moderate depression, 15-19 = Moderately severe depression, 20-27 = Severe depression   Psychosocial Evaluation and Intervention:  Psychosocial Evaluation - 10/05/21 1126       Psychosocial Evaluation & Interventions   Interventions Encouraged to exercise with the program and follow exercise prescription;Stress management education;Relaxation education    Comments  Ms. Flaim has multiple health issues including asthma and sleep apnea as well as heart failure. She was diagnosed with Bipolar affective disorder in 2011 and reports personaility changes. She is on medication and in good communication with her doctor to help manage her symptoms. She does not sleep well due to PTSD and medication only works sometimes. She has had an ongoing history of anxiety and depression, with the anxiety causing seizures in the past. She has fallen multiple times in the last year due to light headedness which is brought on by an increased heart rate. She is wanting to try the program to help with her qaulity of life and help manage her symptoms.    Expected Outcomes Short: attend pulmonary rehab for education and exercise. Long: develop and maintain positive self care habits.    Continue Psychosocial Services  Follow up required by staff             Psychosocial Re-Evaluation:   Psychosocial Discharge (Final Psychosocial Re-Evaluation):   Education: Education Goals: Education classes will be provided on a weekly basis, covering required topics. Participant will state understanding/return demonstration of topics presented.  Learning Barriers/Preferences:  Learning Barriers/Preferences - 10/05/21 1124       Learning Barriers/Preferences   Learning Barriers None    Learning Preferences None             General Pulmonary Education Topics:  Infection Prevention: - Provides verbal and written material to individual with discussion of infection control including proper hand washing and proper equipment cleaning during exercise session. Flowsheet Row Pulmonary Rehab from 11/14/2021 in [facility] Cardiac and Pulmonary Rehab  Date 11/14/21  Educator [facility]  Instruction Review Code 1- Verbalizes Understanding       Falls Prevention: - Provides verbal and written material to individual with discussion of falls prevention and safety. Flowsheet Row Pulmonary Rehab from  11/14/2021 in [facility] Cardiac and Pulmonary Rehab  Date 11/14/21  Educator  [facility]  Instruction Review Code 1- Verbalizes Understanding       Chronic Lung Disease Review: - Group verbal instruction with posters, models, PowerPoint presentations and videos,  to review new updates, new respiratory medications, new advancements in procedures and treatments. Providing information on websites and "800" numbers for continued self-education. Includes information about supplement oxygen, available portable oxygen systems, continuous and intermittent flow rates, oxygen safety, concentrators, and Medicare reimbursement for oxygen. Explanation of Pulmonary Drugs, including class, frequency, complications, importance of spacers, rinsing mouth after steroid MDI's, and proper cleaning methods for nebulizers. Review of basic lung anatomy and physiology related to function, structure, and complications of lung disease. Review of risk factors. Discussion about methods for diagnosing sleep apnea and types of masks and machines for OSA. Includes a review of the use of types of environmental controls: home humidity, furnaces, filters, dust mite/pet prevention, HEPA vacuums. Discussion about weather changes, air quality and the benefits of nasal washing. Instruction on Warning signs, infection symptoms, calling MD promptly, preventive modes, and value of vaccinations. Review of effective airway clearance, coughing and/or vibration techniques. Emphasizing that all should Create an Action Plan. Written material given at graduation. Flowsheet Row Pulmonary Rehab from 11/14/2021 in [facility] Cardiac and Pulmonary Rehab  Education need identified 11/14/21       AED/CPR: - Group verbal and written instruction with the use of models to demonstrate the basic use of the AED with the basic ABC's of resuscitation.    Anatomy and Cardiac Procedures: - Group verbal and visual presentation and models provide information about basic cardiac  anatomy and function. Reviews the testing methods done to diagnose heart disease and the outcomes of the test results. Describes the treatment choices: Medical Management, Angioplasty, or Coronary Bypass Surgery for treating various heart conditions including Myocardial Infarction, Angina, Valve Disease, and Cardiac Arrhythmias.  Written material given at graduation.   Medication Safety: - Group verbal and visual instruction to review commonly prescribed medications for heart and lung disease. Reviews the medication, class of the drug, and side effects. Includes the steps to properly store meds and maintain the prescription regimen.  Written material given at graduation.   Other: -Provides group and verbal instruction on various topics (see comments)   Knowledge Questionnaire Score:  Knowledge Questionnaire Score - 11/14/21 1611       Knowledge Questionnaire Score   Pre Score 10/18              Core Components/Risk Factors/Patient Goals at Admission:  Personal Goals and Risk Factors at Admission - 11/14/21 1610       Core Components/Risk Factors/Patient Goals on Admission    Weight Management Yes;Weight Loss    Intervention Weight Management: Provide education and appropriate resources to help participant work on and attain dietary goals.;Weight Management/Obesity: Establish reasonable short term and long term weight goals.;Weight Management: Develop a combined nutrition and exercise program designed to reach desired caloric intake, while maintaining appropriate intake of nutrient and fiber, sodium and fats, and appropriate energy expenditure required for the weight goal.    Expected Outcomes Short Term: Continue to assess and modify interventions until short term weight is achieved;Weight Loss: Understanding of general recommendations for a balanced deficit meal plan, which promotes 1-2 lb weight loss per week and includes a negative energy balance of [phone] kcal/d;Understanding  recommendations for meals to include 15-35% energy as protein, 25-35% energy from fat, 35-60% energy from carbohydrates, less than 200mg  of dietary cholesterol, 20-35 gm of total fiber daily;Understanding of distribution  of calorie intake throughout the day with the consumption of 4-5 meals/snacks;Long Term: Adherence to nutrition and physical activity/exercise program aimed toward attainment of established weight goal    Increase knowledge of respiratory medications and ability to use respiratory devices properly  Yes    Intervention Provide education and demonstration as needed of appropriate use of medications, inhalers, and oxygen therapy.    Expected Outcomes Short Term: Achieves understanding of medications use. Understands that oxygen is a medication prescribed by physician. Demonstrates appropriate use of inhaler and oxygen therapy.    Hypertension Yes    Intervention Provide education on lifestyle modifcations including regular physical activity/exercise, weight management, moderate sodium restriction and increased consumption of fresh fruit, vegetables, and low fat dairy, alcohol moderation, and smoking cessation.;Monitor prescription use compliance.    Expected Outcomes Short Term: Continued assessment and intervention until BP is < 140/45mm HG in hypertensive participants. < 130/97mm HG in hypertensive participants with diabetes, heart failure or chronic kidney disease.;Long Term: Maintenance of blood pressure at goal levels.    Lipids Yes    Intervention Provide education and support for participant on nutrition & aerobic/resistive exercise along with prescribed medications to achieve LDL 70mg , HDL >40mg .    Expected Outcomes Short Term: Participant states understanding of desired cholesterol values and is compliant with medications prescribed. Participant is following exercise prescription and nutrition guidelines.;Long Term: Cholesterol controlled with medications as prescribed, with  individualized exercise RX and with personalized nutrition plan. Value goals: LDL < , HDL > 40 mg.             Education:Diabetes - Individual verbal and written instruction to review signs/symptoms of diabetes, desired ranges of glucose level fasting, after meals and with exercise. Acknowledge that pre and post exercise glucose checks will be done for 3 sessions at entry of program.   Know Your Numbers and Heart Failure: - Group verbal and visual instruction to discuss disease risk factors for cardiac and pulmonary disease and treatment options.  Reviews associated critical values for Overweight/Obesity, Hypertension, Cholesterol, and Diabetes.  Discusses basics of heart failure: signs/symptoms and treatments.  Introduces Heart Failure Zone chart for action plan for heart failure.  Written material given at graduation.   Core Components/Risk Factors/Patient Goals Review:    Core Components/Risk Factors/Patient Goals at Discharge (Final Review):    ITP Comments:  ITP Comments     Row Name 10/05/21 1116 11/14/21 1620 11/23/21 1122 12/20/21 0804 12/21/21 0943   ITP Comments Initial telephone orientation completed. Diagnosis can be found in [facility] 9/22. EP orientation scheduled for Wednesday 11/23 at 11am. Completed and gym orientation. Initial ITP created and sent for review to Dr. Vida Rigger, Medical Director. 30 Day review completed. Medical Director ITP review done, changes made as directed, and signed approval by Medical Director.    New to program Pt never returned after orientation nor returned contact attempts.  We will discharge at this time. 30 Day review completed. Medical Director ITP review done, changes made as directed, and signed approval by Medical Director.   Discharged            Comments: Discharge ITP

## 2021-12-26 ENCOUNTER — Ambulatory Visit: Payer: Medicare Other

## 2021-12-28 ENCOUNTER — Ambulatory Visit: Payer: Medicare Other

## 2021-12-29 ENCOUNTER — Encounter: Payer: Self-pay | Admitting: Hematology and Oncology

## 2021-12-29 ENCOUNTER — Other Ambulatory Visit: Payer: Self-pay | Admitting: *Deleted

## 2021-12-29 DIAGNOSIS — D509 Iron deficiency anemia, unspecified: Secondary | ICD-10-CM

## 2021-12-29 DIAGNOSIS — E538 Deficiency of other specified B group vitamins: Secondary | ICD-10-CM

## 2021-12-29 DIAGNOSIS — Z86711 Personal history of pulmonary embolism: Secondary | ICD-10-CM

## 2022-01-02 ENCOUNTER — Ambulatory Visit: Payer: Medicare Other

## 2022-01-04 ENCOUNTER — Ambulatory Visit: Payer: Medicare Other

## 2022-01-04 ENCOUNTER — Other Ambulatory Visit: Payer: Medicare Other

## 2022-01-04 ENCOUNTER — Ambulatory Visit: Payer: Medicare Other | Admitting: Oncology

## 2022-01-05 ENCOUNTER — Inpatient Hospital Stay: Payer: Medicare Other | Attending: Oncology

## 2022-01-05 ENCOUNTER — Encounter: Payer: Self-pay | Admitting: Nurse Practitioner

## 2022-01-05 ENCOUNTER — Other Ambulatory Visit: Payer: Self-pay

## 2022-01-05 ENCOUNTER — Inpatient Hospital Stay (HOSPITAL_BASED_OUTPATIENT_CLINIC_OR_DEPARTMENT_OTHER): Payer: Medicare Other | Admitting: Nurse Practitioner

## 2022-01-05 VITALS — BP 130/87 | HR 92 | Temp 97.3°F | Resp 20 | Wt 383.4 lb

## 2022-01-05 DIAGNOSIS — Z86711 Personal history of pulmonary embolism: Secondary | ICD-10-CM

## 2022-01-05 DIAGNOSIS — D509 Iron deficiency anemia, unspecified: Secondary | ICD-10-CM

## 2022-01-05 DIAGNOSIS — Z8379 Family history of other diseases of the digestive system: Secondary | ICD-10-CM | POA: Diagnosis not present

## 2022-01-05 DIAGNOSIS — E669 Obesity, unspecified: Secondary | ICD-10-CM | POA: Insufficient documentation

## 2022-01-05 DIAGNOSIS — Z7901 Long term (current) use of anticoagulants: Secondary | ICD-10-CM | POA: Insufficient documentation

## 2022-01-05 DIAGNOSIS — I110 Hypertensive heart disease with heart failure: Secondary | ICD-10-CM | POA: Insufficient documentation

## 2022-01-05 DIAGNOSIS — Z880 Allergy status to penicillin: Secondary | ICD-10-CM | POA: Insufficient documentation

## 2022-01-05 DIAGNOSIS — Z79899 Other long term (current) drug therapy: Secondary | ICD-10-CM | POA: Diagnosis not present

## 2022-01-05 DIAGNOSIS — Z818 Family history of other mental and behavioral disorders: Secondary | ICD-10-CM | POA: Diagnosis not present

## 2022-01-05 DIAGNOSIS — I509 Heart failure, unspecified: Secondary | ICD-10-CM | POA: Diagnosis not present

## 2022-01-05 DIAGNOSIS — E538 Deficiency of other specified B group vitamins: Secondary | ICD-10-CM

## 2022-01-05 DIAGNOSIS — M7989 Other specified soft tissue disorders: Secondary | ICD-10-CM | POA: Diagnosis not present

## 2022-01-05 DIAGNOSIS — Z888 Allergy status to other drugs, medicaments and biological substances status: Secondary | ICD-10-CM | POA: Diagnosis not present

## 2022-01-05 DIAGNOSIS — F319 Bipolar disorder, unspecified: Secondary | ICD-10-CM | POA: Insufficient documentation

## 2022-01-05 DIAGNOSIS — Z833 Family history of diabetes mellitus: Secondary | ICD-10-CM | POA: Diagnosis not present

## 2022-01-05 DIAGNOSIS — D6862 Lupus anticoagulant syndrome: Secondary | ICD-10-CM | POA: Insufficient documentation

## 2022-01-05 DIAGNOSIS — Z9049 Acquired absence of other specified parts of digestive tract: Secondary | ICD-10-CM | POA: Insufficient documentation

## 2022-01-05 DIAGNOSIS — R5383 Other fatigue: Secondary | ICD-10-CM | POA: Insufficient documentation

## 2022-01-05 DIAGNOSIS — Z8249 Family history of ischemic heart disease and other diseases of the circulatory system: Secondary | ICD-10-CM | POA: Insufficient documentation

## 2022-01-05 DIAGNOSIS — Z836 Family history of other diseases of the respiratory system: Secondary | ICD-10-CM | POA: Diagnosis not present

## 2022-01-05 LAB — CBC WITH DIFFERENTIAL/PLATELET
Abs Immature Granulocytes: 0.01 10*3/uL (ref 0.00–0.07)
Basophils Absolute: 0 10*3/uL (ref 0.0–0.1)
Basophils Relative: 1 %
Eosinophils Absolute: 0 10*3/uL (ref 0.0–0.5)
Eosinophils Relative: 1 %
HCT: 40.8 % (ref 36.0–46.0)
Hemoglobin: 12.9 g/dL (ref 12.0–15.0)
Immature Granulocytes: 0 %
Lymphocytes Relative: 44 %
Lymphs Abs: 2.3 10*3/uL (ref 0.7–4.0)
MCH: 26.5 pg (ref 26.0–34.0)
MCHC: 31.6 g/dL (ref 30.0–36.0)
MCV: 84 fL (ref 80.0–100.0)
Monocytes Absolute: 0.4 10*3/uL (ref 0.1–1.0)
Monocytes Relative: 7 %
Neutro Abs: 2.5 10*3/uL (ref 1.7–7.7)
Neutrophils Relative %: 47 %
Platelets: 262 10*3/uL (ref 150–400)
RBC: 4.86 MIL/uL (ref 3.87–5.11)
RDW: 14.1 % (ref 11.5–15.5)
WBC: 5.2 10*3/uL (ref 4.0–10.5)
nRBC: 0 % (ref 0.0–0.2)

## 2022-01-05 LAB — IRON AND TIBC
Iron: 91 ug/dL (ref 28–170)
Saturation Ratios: 24 % (ref 10.4–31.8)
TIBC: 381 ug/dL (ref 250–450)
UIBC: 290 ug/dL

## 2022-01-05 LAB — D-DIMER, QUANTITATIVE: D-Dimer, Quant: 0.97 ug/mL-FEU — ABNORMAL HIGH (ref 0.00–0.50)

## 2022-01-05 LAB — FERRITIN: Ferritin: 29 ng/mL (ref 11–307)

## 2022-01-05 LAB — VITAMIN B12: Vitamin B-12: 450 pg/mL (ref 180–914)

## 2022-01-05 MED ORDER — RIVAROXABAN 20 MG PO TABS: 20.0000 mg | ORAL_TABLET | Freq: Every day | ORAL | 1 refills | Status: DC

## 2022-01-05 NOTE — Progress Notes (Signed)
No complaints at this time.  Would like refill on B12.

## 2022-01-05 NOTE — Progress Notes (Signed)
Hematology/Oncology Consult note [facility]  [phone] [phone]  Patient Care Team: Donnie Coffin, MD as PCP - General (Family Medicine) Elgie Collard, MD as Referring Physician (Obstetrics and Gynecology) Sindy Guadeloupe, MD as Medical Oncologist (Hematology and Oncology)   Name of the patient: Andrea Miller  IN:[id number]  09-24-1986   Date of visit: 01/05/22  Diagnosis-history of pulmonary embolism and iron deficiency and B12 deficiency anemia  Chief complaint/ Reason for visit-routine follow-up of anemia  Heme/Onc history: Patient is a 36 year old African-American female with a history of bilateral pulmonary emboli that was diagnosed in October 2019.  Lower extremity Doppler at that time did not reveal any evidence of DVT.Hypercoagulable work-up on 10/11/2018 revealed the following negative studies: factor V Leiden, prothrombin gene mutation, anti-cardiolipin antibodies, and beta-2 glycoprotein antibodies.  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  Protein C, protein S, and ATIII were normal on 01/15/2019.  Lupus anticoagulant testing was negative on 04/24/2019.  Obesity and birth control pills were considered to be putative risk factors.  Patient is currently on Xarelto for this  She also has a history of iron and B12 deficiency anemia.  Etiology of iron deficiency has been attributed to heavy menstrual bleeding.  Interval history-patient reports chronic bilateral lower extremity swelling. Continues lasix. Does not like compression stockings. Continues xarelto and denies missing doses. No new complaints.   ECOG PS- 1 Pain scale- 0   Review of systems- Review of Systems  Constitutional:  Positive for malaise/fatigue. Negative for chills, fever and weight loss.  HENT:  Negative for congestion, ear discharge and nosebleeds.   Eyes:  Negative for blurred vision.  Respiratory:  Negative for cough, hemoptysis, sputum  production, shortness of breath and wheezing.   Cardiovascular:  Positive for leg swelling. Negative for chest pain, palpitations, orthopnea and claudication.  Gastrointestinal:  Negative for abdominal pain, blood in stool, constipation, diarrhea, heartburn, melena, nausea and vomiting.  Genitourinary:  Negative for dysuria, flank pain, frequency, hematuria and urgency.  Musculoskeletal:  Negative for back pain, joint pain and myalgias.  Skin:  Negative for rash.  Neurological:  Negative for dizziness, tingling, focal weakness, seizures, weakness and headaches.  Endo/Heme/Allergies:  Does not bruise/bleed easily.  Psychiatric/Behavioral:  Negative for depression and suicidal ideas. The patient does not have insomnia.       Allergies  Allergen Reactions   Cortizone-10 [Hydrocortisone] Shortness Of Breath and Swelling   Garlic Anaphylaxis   Prednisone Swelling    Tongue swelling   Contrast Media [Iodinated Contrast Media]     Pt reports that "every time" she receives contrast she "can't breathe" for a short time. At time of arrival to room after CT scan, she is breathing normally and VS WNL.  Pt states that she thought not breathing after contrast for a little while was normal. When asked if allergic or has any reaction to IV contrast patient stated no. LP    Corticosteroids Palpitations   Penicillins Other (See Comments) and Rash    Has patient had a PCN reaction causing immediate rash, facial/tongue/throat swelling, SOB or lightheadedness with hypotension:Yes Has patient had a PCN reaction causing severe rash involving mucus membranes or skin necrosis:No Has patient had a PCN reaction that required hospitalization:No Has patient had a PCN reaction occurring within the last 10 years:Yes. If all of the above answers are "NO", then may proceed with Cephalosporin use.  Has patient had a PCN reaction causing immediate rash, facial/tongue/throat swelling, SOB or lightheadedness with  hypotension:Yes Has patient had a PCN reaction causing severe rash involving mucus membranes or skin necrosis:No Has patient had a PCN reaction that required hospitalization:No Has patient had a PCN reaction occurring within the last 10 years:Yes. If all of the above answers are "NO", then may proceed with Cephalosporin use.   Patient does not remember Patient does not remember     Past Medical History:  Diagnosis Date   Anxiety    Asthma    Bipolar disorder ([place])    Chest pain    and angina   CHF (congestive heart failure) (HCC)    Depression    Fibromyalgia    Hypertension    Left lumbar radiculopathy since 08/2014   secondary to work accident   Obesity    Pre-diabetes    Pulmonary embolus ([place])    Seizures ([place])    secondary to anxiety  last seizure 01/25/18   SVT (supraventricular tachycardia) (HCC)    with hx of syncope; managed by [facility]     Past Surgical History:  Procedure Laterality Date   CHOLECYSTECTOMY N/A 02/22/2018   Procedure: LAPAROSCOPIC CHOLECYSTECTOMY;  Surgeon: Vickie Epley, MD;  Location: [facility] ORS;  Service: General;  Laterality: N/A;   TONSILLECTOMY AND ADENOIDECTOMY N/A 09/05/2016   Procedure: TONSILLECTOMY AND ADENOIDECTOMY;  Surgeon: Beverly Gust, MD;  Location: [facility] ORS;  Service: ENT;  Laterality: N/A;    Social History   Socioeconomic History   Marital status: Divorced    Spouse name: Not on file   Number of children: Not on file   Years of education: Not on file   Highest education level: Not on file  Occupational History   Not on file  Tobacco Use   Smoking status: Never   Smokeless tobacco: Never  Vaping Use   Vaping Use: Never used  Substance and Sexual Activity   Alcohol use: Not Currently   Drug use: No   Sexual activity: Yes    Birth control/protection: None  Other Topics Concern   Not on file  Social History Narrative   Not on file   Social Determinants of Health   Financial Resource Strain: Not on file   Food Insecurity: Not on file  Transportation Needs: Not on file  Physical Activity: Not on file  Stress: Not on file  Social Connections: Not on file  Intimate Partner Violence: Not on file    Family History  Problem Relation Age of Onset   Diabetes Mother    Hypertension Mother    Asthma Mother    Gallstones Mother    Diabetes Father    Hypertension Father    Gallstones Father    Bipolar disorder Sister    COPD Brother    Schizophrenia Brother    COPD Brother    Hypertension Brother      Current Outpatient Medications:    Adapalene 0.3 % gel, Apply 1 application topically at bedtime., Disp: , Rfl:    albuterol (PROVENTIL) (2.5 MG/3ML) 0.083% nebulizer solution, Take 3 mLs (2.5 mg total) by nebulization every 4 (four) hours as needed for wheezing or shortness of breath., Disp: 360 mL, Rfl: 0   Albuterol Sulfate (PROAIR RESPICLICK) 123XX123 (90 Base) MCG/ACT AEPB, Inhale 2 puffs into the lungs every 6 (six) hours as needed., Disp: 1 each, Rfl: 0   amphetamine-dextroamphetamine (ADDERALL XR) 15 MG 24 hr capsule, TAKE 1 CAPSULE BY MOUTH EACH MORNING, Disp: , Rfl:    budesonide-formoterol (SYMBICORT) 160-4.5 MCG/ACT inhaler, Inhale 2 puffs into the  lungs 2 (two) times daily., Disp: 10.2 g, Rfl: 3   budesonide-formoterol (SYMBICORT) 160-4.5 MCG/ACT inhaler, Inhale 2 puffs into the lungs in the morning and at bedtime., Disp: 1 each, Rfl: 6   buprenorphine (BUTRANS) 15 MCG/HR, Place 1 patch onto the skin once a week., Disp: , Rfl:    Calcium-Magnesium 500-250 MG TABS, Take 1 tablet by mouth 2 (two) times daily. , Disp: , Rfl:    cyanocobalamin (,VITAMIN B-12,) 1000 MCG/ML injection, Inject 1 mL (1,000 mcg total) into the muscle every 30 (thirty) days for 12 doses., Disp: 1 mL, Rfl: 11   Dexlansoprazole 30 MG capsule, Take 30 mg by mouth daily., Disp: , Rfl:    diclofenac Sodium (VOLTAREN) 1 % GEL, Apply 2 g topically 4 (four) times daily., Disp: 100 g, Rfl: 0   diltiazem (CARDIZEM CD) 360  MG 24 hr capsule, Take 360 mg by mouth every morning., Disp: , Rfl:    escitalopram (LEXAPRO) 20 MG tablet, Take 20 mg by mouth daily., Disp: , Rfl:    fluticasone (FLONASE) 50 MCG/ACT nasal spray, Place 2 sprays into both nostrils daily as needed for allergies., Disp: 16 g, Rfl: 1   furosemide (LASIX) 20 MG tablet, Take 40 mg by mouth daily., Disp: , Rfl:    gabapentin (NEURONTIN) 800 MG tablet, Take 800 mg by mouth 3 (three) times daily., Disp: , Rfl:    hydrocortisone 2.5 % cream, Apply to eczema on face 1-2 times a day as needed until rash improved., Disp: 30 g, Rfl: 5   hydroxypropyl methylcellulose / hypromellose (ISOPTO TEARS / GONIOVISC) 2.5 % ophthalmic solution, Place 1 drop into both eyes 3 (three) times daily as needed for dry eyes., Disp: , Rfl:    ipratropium (ATROVENT) 0.06 % nasal spray, Place 2 sprays into both nostrils 4 (four) times daily., Disp: 15 mL, Rfl: 0   isosorbide mononitrate (IMDUR) 30 MG 24 hr tablet, Take 30 mg by mouth daily., Disp: , Rfl:    ketoconazole (NIZORAL) 2 % cream, Apply 1-2 times a day to itchy rash face, scalp, under breast until clear, then prn flares., Disp: 60 g, Rfl: 11   ketoconazole (NIZORAL) 2 % shampoo, Wash scalp 1-2 times a week, let sit 5 minutes and rinse out, Disp: 120 mL, Rfl: 11   lactulose (CHRONULAC) 10 GM/15ML solution, Take 30 g by mouth daily as needed for mild constipation., Disp: , Rfl: 5   lamoTRIgine (LAMICTAL) 200 MG tablet, Take 1 tablet by mouth 2 (two) times a day., Disp: , Rfl:    linaclotide (LINZESS) 290 MCG CAPS capsule, Take 290 mcg by mouth daily before breakfast., Disp: , Rfl:    Lurasidone HCl (LATUDA) 60 MG TABS, Take by mouth., Disp: , Rfl:    mometasone (ELOCON) 0.1 % cream, Apply to more severe areas eczema 1-2 times as directed. May use on face up to 1 week at a time. Avoid groin, underarms., Disp: 45 g, Rfl: 3   montelukast (SINGULAIR) 10 MG tablet, Take 1 tablet (10 mg total) by mouth at bedtime., Disp: 90 tablet,  Rfl: 1   pimecrolimus (ELIDEL) 1 % cream, Apply topically to areas on face, body, body folds., Disp: 100 g, Rfl: 5   Potassium Chloride ER 20 MEQ TBCR, Take 20 mEq by mouth 2 (two) times daily. , Disp: , Rfl:    propranolol (INDERAL) 80 MG tablet, Take 80 mg by mouth 3 (three) times daily., Disp: , Rfl:    rosuvastatin (CRESTOR) 20 MG  tablet, Take 20 mg by mouth daily., Disp: , Rfl:    SPIRIVA RESPIMAT 1.25 MCG/ACT AERS, INHALE 2 PUFFS INTO THE LUNGS DAILY., Disp: 4 g, Rfl: 0   SYRINGE/NEEDLE, DISP, 1 ML (B-D 1CC SLIP TIP SYR 25GX5/8") 25G X 5/8" 1 ML MISC, Inject 1 mL into the muscle once a week., Disp: 1 each, Rfl: 3   Topiramate ER (TROKENDI XR) 200 MG CP24, Take 1 capsule by mouth daily. , Disp: , Rfl:    tretinoin (RETIN-A) 0.025 % cream, Apply to face nightly as tolerated., Disp: 45 g, Rfl: 5   XARELTO 20 MG TABS tablet, TAKE 1 TABLET BY MOUTH EVERY DAY IN THE MORNING, Disp: 30 tablet, Rfl: 2 No current facility-administered medications for this visit.  Facility-Administered Medications Ordered in Other Visits:    0.9 %  sodium chloride infusion, , Intravenous, Once, Corcoran, Melissa C, MD   iron sucrose (VENOFER) injection 200 mg, 200 mg, Intravenous, Once, Sindy Guadeloupe, MD  Physical exam:  There were no vitals filed for this visit.  Physical Exam Constitutional:      Appearance: She is obese.  Cardiovascular:     Rate and Rhythm: Normal rate and regular rhythm.     Heart sounds: Normal heart sounds.  Pulmonary:     Effort: Pulmonary effort is normal.     Breath sounds: Normal breath sounds.  Abdominal:     General: Bowel sounds are normal.     Palpations: Abdomen is soft.  Musculoskeletal:     Comments: Trace bilateral edema  Skin:    General: Skin is warm and dry.  Neurological:     Mental Status: She is alert and oriented to person, place, and time.     CMP Latest Ref Rng & Units 02/24/2021  Glucose 70 - 99 mg/dL 144(H)  BUN 6 - 20 mg/dL 7  Creatinine 0.44 - 1.00  mg/dL 0.52  Sodium 135 - 145 mmol/L 138  Potassium 3.5 - 5.1 mmol/L 3.4(L)  Chloride 98 - 111 mmol/L 103  CO2 22 - 32 mmol/L 26  Calcium 8.9 - 10.3 mg/dL 8.8(L)  Total Protein 6.5 - 8.1 g/dL 6.9  Total Bilirubin 0.3 - 1.2 mg/dL 0.3  Alkaline Phos 38 - 126 U/L 64  AST 15 - 41 U/L 19  ALT 0 - 44 U/L 9   CBC Latest Ref Rng & Units 06/21/2021  WBC 4.0 - 10.5 K/uL 5.4  Hemoglobin 12.0 - 15.0 g/dL 12.4  Hematocrit 36.0 - 46.0 % 38.8  Platelets 150 - 400 K/uL 225     Assessment and plan- Patient is a 36 y.o. female with  History of small bilateral PE- October 2019. Thought to be secondary to OCPs and obesity. Hypercoagulable workup was negative. She has been on xarelto since 2019. Previously discussed stopping anticoagulation if her d dimer levels were less than 250. She was hesitant to stop d/t ongoing bilateral lower extremity edema and that she remains obese which is risk factor for dvt. Therefore, she will continue xarelto for now. D dimer levels pending at time of visit. Symptomatically, she is stable. Refill of xarelto provided.  B12 Deficiency- last b12 was 398 in 10/21. Today's level pending. She receives b12 injections monthly at home. Follow up based on result.  Iron Deficiency Anemia- Ferritin and iron studies are pending at time of visit.   6 mo- lab then day to week later see Dr. Janese Banks; patient can do virtual visit If preferred   Visit Diagnosis 1. Iron  deficiency anemia, unspecified iron deficiency anemia type   2. B12 deficiency   3. History of pulmonary embolism   4. Chronic anticoagulation    Beckey Rutter, DNP, AGNP-C [place] at [facility] [phone] (clinic) 01/05/2022

## 2022-01-06 NOTE — Addendum Note (Signed)
Addended by: Suzan Slick on: 01/06/2022 10:23 AM   Modules accepted: Orders

## 2022-01-09 ENCOUNTER — Telehealth: Payer: Self-pay | Admitting: *Deleted

## 2022-01-09 ENCOUNTER — Ambulatory Visit: Payer: Medicare Other

## 2022-01-09 NOTE — Telephone Encounter (Signed)
called the pt. And she wanted labs results. The d dimer is elevated. The hgb is normal , the ferritin is down 29,iron sat 24, b12  higher that usual but dr Smith Robert like it to be more than 512. She is on b12 inj. With her hgb being in normal range I will need to speak to Smith Robert to see if she needs IV iron. The pt. States that she is very fatigued . She would like to get more iron tx. I will speak to Smith Robert and let her know tom. Patient agreeable to this

## 2022-01-09 NOTE — Telephone Encounter (Signed)
done

## 2022-01-10 NOTE — Telephone Encounter (Signed)
See other email encounter

## 2022-01-11 ENCOUNTER — Ambulatory Visit: Payer: Medicare Other

## 2022-01-12 ENCOUNTER — Encounter: Payer: Self-pay | Admitting: *Deleted

## 2022-01-16 ENCOUNTER — Ambulatory Visit: Payer: Medicare Other

## 2022-01-18 ENCOUNTER — Ambulatory Visit: Payer: Medicare Other

## 2022-01-23 ENCOUNTER — Ambulatory Visit: Payer: Medicare Other

## 2022-01-23 ENCOUNTER — Ambulatory Visit: Payer: Medicare Other | Admitting: Cardiology

## 2022-01-24 ENCOUNTER — Encounter: Payer: Self-pay | Admitting: Cardiology

## 2022-01-25 ENCOUNTER — Ambulatory Visit: Payer: Medicare Other

## 2022-01-30 ENCOUNTER — Ambulatory Visit: Payer: Medicare Other

## 2022-02-01 ENCOUNTER — Ambulatory Visit: Payer: Medicare Other

## 2022-02-02 ENCOUNTER — Other Ambulatory Visit: Payer: Self-pay | Admitting: Dermatology

## 2022-02-02 DIAGNOSIS — L209 Atopic dermatitis, unspecified: Secondary | ICD-10-CM

## 2022-02-02 NOTE — Telephone Encounter (Signed)
Left message on voicemail to return my call. Is patient allergic to [facility] cream? Severe allergy alert popped up when trying to send in refills requested from the pharmacy.  ?

## 2022-02-03 ENCOUNTER — Other Ambulatory Visit: Payer: Self-pay | Admitting: Dermatology

## 2022-02-03 DIAGNOSIS — L209 Atopic dermatitis, unspecified: Secondary | ICD-10-CM

## 2022-02-06 ENCOUNTER — Ambulatory Visit: Payer: Medicare Other

## 2022-02-06 ENCOUNTER — Other Ambulatory Visit: Payer: Self-pay

## 2022-02-06 DIAGNOSIS — L700 Acne vulgaris: Secondary | ICD-10-CM

## 2022-02-06 DIAGNOSIS — L304 Erythema intertrigo: Secondary | ICD-10-CM

## 2022-02-06 DIAGNOSIS — L209 Atopic dermatitis, unspecified: Secondary | ICD-10-CM

## 2022-02-06 MED ORDER — MOMETASONE FUROATE 0.1 % EX CREA: TOPICAL_CREAM | CUTANEOUS | 3 refills | Status: DC

## 2022-02-06 MED ORDER — TRETINOIN 0.025 % EX CREA
TOPICAL_CREAM | CUTANEOUS | 5 refills | Status: DC
Start: 2022-02-06 — End: 2022-03-28

## 2022-02-06 MED ORDER — KETOCONAZOLE 2 % EX CREA: TOPICAL_CREAM | CUTANEOUS | 11 refills | Status: DC

## 2022-02-06 MED ORDER — PIMECROLIMUS 1 % EX CREA: TOPICAL_CREAM | CUTANEOUS | 5 refills | Status: DC

## 2022-02-06 NOTE — Progress Notes (Signed)
Patient requested refills of all medications that Dr. Nicole Kindred prescribes.  ?

## 2022-02-08 ENCOUNTER — Ambulatory Visit: Payer: Medicare Other

## 2022-02-13 ENCOUNTER — Ambulatory Visit: Payer: Medicare Other

## 2022-02-15 ENCOUNTER — Ambulatory Visit: Payer: Medicare Other

## 2022-02-20 ENCOUNTER — Ambulatory Visit: Payer: Medicare Other

## 2022-02-22 ENCOUNTER — Ambulatory Visit: Payer: Medicare Other

## 2022-02-27 ENCOUNTER — Ambulatory Visit: Payer: Medicare Other

## 2022-03-01 ENCOUNTER — Ambulatory Visit: Payer: Medicare Other

## 2022-03-06 ENCOUNTER — Ambulatory Visit: Payer: Medicare Other

## 2022-03-08 ENCOUNTER — Ambulatory Visit: Payer: Medicare Other

## 2022-03-13 ENCOUNTER — Ambulatory Visit: Payer: Medicare Other

## 2022-03-15 ENCOUNTER — Ambulatory Visit: Payer: Medicare Other

## 2022-03-16 ENCOUNTER — Other Ambulatory Visit: Payer: Self-pay | Admitting: Dermatology

## 2022-03-16 DIAGNOSIS — L700 Acne vulgaris: Secondary | ICD-10-CM

## 2022-03-21 ENCOUNTER — Ambulatory Visit: Payer: Medicare Other | Admitting: Dermatology

## 2022-03-24 ENCOUNTER — Other Ambulatory Visit: Payer: Self-pay | Admitting: Dermatology

## 2022-03-24 DIAGNOSIS — L700 Acne vulgaris: Secondary | ICD-10-CM

## 2022-03-28 ENCOUNTER — Other Ambulatory Visit: Payer: Self-pay | Admitting: Dermatology

## 2022-03-28 ENCOUNTER — Ambulatory Visit (INDEPENDENT_AMBULATORY_CARE_PROVIDER_SITE_OTHER): Payer: Medicare Other | Admitting: Dermatology

## 2022-03-28 DIAGNOSIS — L700 Acne vulgaris: Secondary | ICD-10-CM

## 2022-03-28 DIAGNOSIS — L219 Seborrheic dermatitis, unspecified: Secondary | ICD-10-CM | POA: Diagnosis not present

## 2022-03-28 DIAGNOSIS — L918 Other hypertrophic disorders of the skin: Secondary | ICD-10-CM | POA: Diagnosis not present

## 2022-03-28 DIAGNOSIS — L304 Erythema intertrigo: Secondary | ICD-10-CM | POA: Diagnosis not present

## 2022-03-28 DIAGNOSIS — R21 Rash and other nonspecific skin eruption: Secondary | ICD-10-CM

## 2022-03-28 DIAGNOSIS — L209 Atopic dermatitis, unspecified: Secondary | ICD-10-CM

## 2022-03-28 DIAGNOSIS — L820 Inflamed seborrheic keratosis: Secondary | ICD-10-CM

## 2022-03-28 MED ORDER — KETOCONAZOLE 2 % EX CREA
TOPICAL_CREAM | CUTANEOUS | 11 refills | Status: DC
Start: 2022-03-28 — End: 2022-05-16

## 2022-03-28 MED ORDER — ADAPALENE 0.3 % EX GEL: 1.0000 "application " | Freq: Every day | CUTANEOUS | 11 refills | Status: DC

## 2022-03-28 MED ORDER — KETOCONAZOLE 2 % EX SHAM: MEDICATED_SHAMPOO | CUTANEOUS | 11 refills | Status: DC

## 2022-03-28 MED ORDER — PIMECROLIMUS 1 % EX CREA: TOPICAL_CREAM | CUTANEOUS | 11 refills | Status: DC

## 2022-03-28 NOTE — Patient Instructions (Addendum)
Continue ketoconazole cream 2 % - apply 1 - 2 times daily to itchy rash at face, scalp under breast until clear and then prn for flares.  ? ?Start Elidel 1 % cream apply topically to areas on face, body and body folds for rash.  ? ? ? ? ? ?Start adapalene 0.3 % gel apply to face nightly for acne  ? ?Topical retinoid medications like tretinoin/Retin-A, adapalene/Differin, tazarotene/Fabior, and Epiduo/Epiduo Forte can cause dryness and irritation when first started. Only apply a pea-sized amount to the entire affected area. Avoid applying it around the eyes, edges of mouth and creases at the nose. If you experience irritation, use a good moisturizer first and/or apply the medicine less often. If you are doing well with the medicine, you can increase how often you use it until you are applying every night. Be careful with sun protection while using this medication as it can make you sensitive to the sun. This medicine should not be used by pregnant women.  ? ? ? ? ? ?Seborrheic Keratosis ? ?What causes seborrheic keratoses? ?Seborrheic keratoses are harmless, common skin growths that first appear during adult life.  As time goes by, more growths appear.  Some people may develop a large number of them.  Seborrheic keratoses appear on both covered and uncovered body parts.  They are not caused by sunlight.  The tendency to develop seborrheic keratoses can be inherited.  They vary in color from skin-colored to gray, brown, or even black.  They can be either smooth or have a rough, warty surface.   ?Seborrheic keratoses are superficial and look as if they were stuck on the skin.  Under the microscope this type of keratosis looks like layers upon layers of skin.  That is why at times the top layer may seem to fall off, but the rest of the growth remains and re-grows.   ? ?Treatment ?Seborrheic keratoses do not need to be treated, but can easily be removed in the office.  Seborrheic keratoses often cause symptoms when they  rub on clothing or jewelry.  Lesions can be in the way of shaving.  If they become inflamed, they can cause itching, soreness, or burning.  Removal of a seborrheic keratosis can be accomplished by freezing, burning, or surgery. ?If any spot bleeds, scabs, or grows rapidly, please return to have it checked, as these can be an indication of a skin cancer. ? ? ? ? ? ?If You Need Anything After Your Visit ? ?If you have any questions or concerns for your doctor, please call our main line at [phone] and press option 4 to reach your doctor's medical assistant. If no one answers, please leave a voicemail as directed and we will return your call as soon as possible. Messages left after 4 pm will be answered the following business day.  ? ?You may also send [place] a message via MyChart. We typically respond to MyChart messages within 1-2 business days. ? ?For prescription refills, please ask your pharmacy to contact our office. Our fax number is [phone]. ? ?If you have an urgent issue when the clinic is closed that cannot wait until the next business day, you can page your doctor at the number below.   ? ?Please note that while we do our best to be available for urgent issues outside of office hours, we are not available 24/7.  ? ?If you have an urgent issue and are unable to reach [place], you may choose to seek medical  care at your doctor's office, retail clinic, urgent care center, or emergency room. ? ?If you have a medical emergency, please immediately call 911 or go to the emergency department. ? ?Pager Numbers ? ?- Dr. Gwen Pounds: [phone] ? ?- Dr. Neale Burly: [phone] ? ?- Dr. Roseanne Reno: [phone] ? ?In the event of inclement weather, please call our main line at [phone] for an update on the status of any delays or closures. ? ?Dermatology Medication Tips: ?Please keep the boxes that topical medications come in in order to help keep track of the instructions about where and how to use these. Pharmacies  typically print the medication instructions only on the boxes and not directly on the medication tubes.  ? ?If your medication is too expensive, please contact our office at [phone] option 4 or send [place] a message through MyChart.  ? ?We are unable to tell what your co-pay for medications will be in advance as this is different depending on your insurance coverage. However, we may be able to find a substitute medication at lower cost or fill out paperwork to get insurance to cover a needed medication.  ? ?If a prior authorization is required to get your medication covered by your insurance company, please allow [place] 1-2 business days to complete this process. ? ?Drug prices often vary depending on where the prescription is filled and some pharmacies may offer cheaper prices. ? ?The website www.goodrx.com contains coupons for medications through different pharmacies. The prices here do not account for what the cost may be with help from insurance (it may be cheaper with your insurance), but the website can give you the price if you did not use any insurance.  ?- You can print the associated coupon and take it with your prescription to the pharmacy.  ?- You may also stop by our office during regular business hours and pick up a GoodRx coupon card.  ?- If you need your prescription sent electronically to a different pharmacy, notify our office through [facility] or by phone at [phone] option 4. ? ? ? ? ?Si Usted Necesita Algo Despu?s de Su Visita ? ?Tambi?n puede enviarnos un mensaje a trav?s de MyChart. Por lo general respondemos a los mensajes de MyChart en el transcurso de 1 a 2 d?as h?biles. ? ?Para renovar recetas, por favor pida a su farmacia que se ponga en contacto con nuestra oficina. Nuestro n?mero de fax es el [phone]. ? ?Si tiene un asunto urgente cuando la cl?nica est? cerrada y que no puede esperar hasta el siguiente d?a h?bil, puede llamar/localizar a su doctor(a) al n?mero que  aparece a continuaci?n.  ? ?Por favor, tenga en cuenta que aunque hacemos todo lo posible para estar disponibles para asuntos urgentes fuera del horario de oficina, no estamos disponibles las 24 horas del d?a, los 7 d?as de la semana.  ? ?Si tiene un problema urgente y no puede comunicarse con nosotros, puede optar por buscar atenci?n m?dica  en el consultorio de su doctor(a), en una cl?nica privada, en un centro de atenci?n urgente o en una sala de emergencias. ? ?Si tiene Radio broadcast assistant m?dica, por favor llame inmediatamente al 911 o vaya a la sala de emergencias. ? ?N?meros de b?per ? ?- Dr. Gwen Pounds: [phone] ? ?- Dra. Moye: [phone] ? ?- Dra. Roseanne Reno: [phone] ? ?En caso de inclemencias del tiempo, por favor llame a nuestra l?nea principal al [phone] para una actualizaci?n sobre el estado de cualquier retraso o cierre. ? ?Consejos para la medicaci?n  en dermatolog?a: ?Por favor, guarde las cajas en las que vienen los medicamentos de uso t?pico para ayudarle a seguir las instrucciones sobre d?nde y c?mo usarlos. Las farmacias generalmente imprimen las instrucciones del medicamento s?lo en las cajas y no directamente en los tubos del [place].  ? ?Si su medicamento es muy caro, por favor, p?ngase en contacto con Rolm Gala llamando al [phone] y presione la opci?n 4 o env?enos un mensaje a trav?s de MyChart.  ? ?No podemos decirle cu?l ser? su copago por los medicamentos por adelantado ya que esto es diferente dependiendo de la cobertura de su seguro. Sin embargo, es posible que podamos encontrar un medicamento sustituto a Audiological scientist un formulario para que el seguro cubra el medicamento que se considera necesario.  ? ?Si se requiere Neomia Dear autorizaci?n previa para que su compa??a de seguros [place] su medicamento, por favor perm?tanos de 1 a 2 d?as h?biles para completar este proceso. ? ?Los precios de los medicamentos var?an con frecuencia dependiendo del Environmental consultant de d?nde se surte  la receta y alguna farmacias pueden ofrecer precios m?s baratos. ? ?El sitio web www.goodrx.com tiene cupones para medicamentos de Health and safety inspector. Los precios aqu? no tienen en cuenta lo que podr?a costar co

## 2022-03-28 NOTE — Progress Notes (Signed)
? ?Follow-Up Visit ?  ?Subjective  ?LISET MCMONIGLE is a 36 y.o. female who presents for the following: Rash (Reports rash developing a rash after dental work. States given rx of amoxicillin and noticed rash at face, back, chest, and inner thighs. /) and Other (Patient would like to discuss refills for acne, seb derm, and intertrigo. ).  Pt has documented PCN allergy.  Rash has improved since stopping the amoxicillin. ? ? ?The following portions of the chart were reviewed this encounter and updated as appropriate:   ?  ? ?Review of Systems: No other skin or systemic complaints except as noted in HPI or Assessment and Plan. ? ? ?Objective  ?Well appearing patient in no apparent distress; mood and affect are within normal limits. ? ?A focused examination was performed including chest, back, face, lower abdomen, legs, scalp. Relevant physical exam findings are noted in the Assessment and Plan. ? ?cheeks, medial thigh, lower abodmen, lower back ?Hyperpigmented patches at cheeks, medial thigh, lower abdomen, and  lower back  some with mild scale ? ?face ?scattered comedones and hyperpigmented macules ? ?Scalp ?Pink patches with greasy scale. ? ? ?inframammary ?Mild erythema and hyperpigmentation ? ?Right Axilla (2) ?Fleshy, skin-colored pedunculated papules.  ? ? ?left cheek area ?Small hyperpigmented stuck-on, waxy papules ? ? ?Assessment & Plan  ?Rash and other nonspecific skin eruption ?cheeks, medial thigh, lower abodmen, lower back ? ?Resolving drug reaction to amoxicillin ?Avoid in future, added to allergy list ? ?Recommend Elidel cream - apply to aa face/ body qd/bid prn itchy rash ? ? ?pimecrolimus (ELIDEL) 1 % cream - cheeks, medial thigh, lower abodmen, lower back ?Apply topically to areas on face, body, body folds. ? ?Acne vulgaris ?face ? ?Start adapalene 0.3 % gel - apply to face nightly for acne  ?  ?Topical retinoid medications like tretinoin/Retin-A, adapalene/Differin, tazarotene/Fabior, and  Epiduo/Epiduo Forte can cause dryness and irritation when first started. Only apply a pea-sized amount to the entire affected area. Avoid applying it around the eyes, edges of mouth and creases at the nose. If you experience irritation, use a good moisturizer first and/or apply the medicine less often. If you are doing well with the medicine, you can increase how often you use it until you are applying every night. Be careful with sun protection while using this medication as it can make you sensitive to the sun. This medicine should not be used by pregnant women.  ? ?Adapalene (DIFFERIN) 0.3 % gel - face ?Apply 1 application. topically at bedtime. ? ?Seborrheic dermatitis ?Scalp ? ?Seborrheic Dermatitis  ?-  is a chronic persistent rash characterized by pinkness and scaling most commonly of the mid face but also can occur on the scalp (dandruff), ears; mid chest, mid back and groin.  It tends to be exacerbated by stress and cooler weather.  People who have neurologic disease may experience new onset or exacerbation of existing seborrheic dermatitis.  The condition is not curable but treatable and can be controlled. ? ?Continue Ketoconazole 2% shampoo Apply to scalp and let sit several minutes before rinsing 1 yr rf. ?  ?Continue Ketoconazole 2% cream Apply qd/bid to AAs face 60g 1 yr rf. ?  ?Continue Elidel Cream Apply to Aas face qd/bid prn 100g 5Rf ? ? ? ?ketoconazole (NIZORAL) 2 % shampoo - Scalp ?Apply to scalp and let sit several minutes before rinsing ? ?Erythema intertrigo ?inframammary ? ?Intertrigo is a chronic recurrent rash that occurs in skin fold areas that may be associated  with friction; heat; moisture; yeast; fungus; and bacteria.  It is exacerbated by increased movement / activity; sweating; and higher atmospheric temperature. ?  ?Improved ?Continue ketoconazole 2% cream qd/bid prn flares. ?  ? ? ?ketoconazole (NIZORAL) 2 % cream - inframammary ?Apply 1-2 times a day to itchy rash face, scalp,  under breast until clear, then prn flares. ? ?Atopic dermatitis, unspecified type ? ?Related Medications ?hydrocortisone 2.5 % cream ?APPLY TO ECZEMA ON FACE 1-2 TIMES A DAY AS NEEDED UNTIL RASH IMPROVED. ? ?mometasone (ELOCON) 0.1 % cream ?Apply to more severe areas eczema 1-2 times as directed. May use on face up to 1 week at a time. Avoid groin, underarms. ? ?pimecrolimus (ELIDEL) 1 % cream ?Apply topically to areas on face, body, body folds. ? ?Acrochordon (2) ?Right Axilla ? ?Procedure: Skin tag removal ?Informed consent:  Discussed risks (permanent scarring, infection, pain, bleeding, bruising, redness, and recurrence of the lesion) and benefits of the procedure, as well as the alternatives.  Patient is aware that skin tags are benign lesions, and their removal is often not considered medically necessary.  Informed consent was obtained. ?The area was prepared with isopropyl alcohol. ?Anesthesia:  lidocaine 1% with epinephrine was injected to achieve good local anesthesia ?Snip removal was performed.   ?Antibiotic ointment and a sterile dressing were applied.   ?The patient tolerated procedure well. ?The patient was instructed on post-op care.   ?Number of lesions removed:  2 at right axilla  ? ? ?Inflamed seborrheic keratosis ?left cheek area ? ?Benign, observe.   ? ?Discussed electrodesiccation treatment to symptomatic lesion at face. ? ?Patient deferred treatment at this time. ? ? ? ? ?Return if symptoms worsen or fail to improve. ?I, Asher Muir, CMA, am acting as scribe for Willeen Niece, MD. ? ?Documentation: I have reviewed the above documentation for accuracy and completeness, and I agree with the above. ? ?Willeen Niece MD  ? ?

## 2022-03-30 ENCOUNTER — Other Ambulatory Visit: Payer: Self-pay | Admitting: Dermatology

## 2022-03-30 DIAGNOSIS — L700 Acne vulgaris: Secondary | ICD-10-CM

## 2022-04-13 NOTE — Progress Notes (Signed)
Subjective:    Patient ID: Andrea Miller, female    DOB: [birth year], 36 y.o.   MRN: [id number]  HPI  Andrea Miller is a 36 y/o female with a history of asthma, HTN, SVT, obesity, bipolar, anxiety, depression, fibromyalgia, seizures, bulging disc and chronic heart failure.   Echo report from 09/16/18 reviewed and showed an EF of 55%. Echo report from 07/04/18 reviewed and showed an EF of 73%. Echo report from 04/15/17 reviewed and showed an EF of 60-65%.  Has not been admitted or been in the ED in the last 6 months.   Andrea Miller presents today for a follow-up visit with a chief complaint of moderate fatigue with minimal exertion. Andrea Miller has associated chest tightness, shortness of breath, pedal edema, abdominal distention, light-headedness and difficulty sleeping along with this.   Andrea Miller says that Andrea Miller's been out of propranolol for ~ 1 month. Has also been taking her mom's furosemide because Andrea Miller needed a new RX.   Past Medical History:  Diagnosis Date   Anxiety    Asthma    Bipolar disorder ([place])    Chest pain    and angina   CHF (congestive heart failure) (HCC)    Depression    Fibromyalgia    Hypertension    Left lumbar radiculopathy since 08/2014   secondary to work accident   Obesity    Pre-diabetes    Pulmonary embolus ([place])    Seizures ([place])    secondary to anxiety  last seizure 01/25/18   SVT (supraventricular tachycardia) ([place])    with hx of syncope; managed by [facility]   Past Surgical History:  Procedure Laterality Date   CHOLECYSTECTOMY N/A 02/22/2018   Procedure: LAPAROSCOPIC CHOLECYSTECTOMY;  Surgeon: Vickie Epley, MD;  Location: [facility] ORS;  Service: General;  Laterality: N/A;   TONSILLECTOMY AND ADENOIDECTOMY N/A 09/05/2016   Procedure: TONSILLECTOMY AND ADENOIDECTOMY;  Surgeon: Beverly Gust, MD;  Location: [facility] ORS;  Service: ENT;  Laterality: N/A;   Family History  Problem Relation Age of Onset   Diabetes Mother    Hypertension Mother    Asthma Mother     Gallstones Mother    Diabetes Father    Hypertension Father    Gallstones Father    Bipolar disorder Sister    COPD Brother    Schizophrenia Brother    COPD Brother    Hypertension Brother    Social History   Tobacco Use   Smoking status: Never   Smokeless tobacco: Never  Substance Use Topics   Alcohol use: Not Currently   Allergies  Allergen Reactions   Garlic Anaphylaxis   Prednisone Swelling    Tongue swelling   Contrast Media [Iodinated Contrast Media]     Pt reports that "every time" Andrea Miller receives contrast Andrea Miller "can't breathe" for a short time. At time of arrival to room after CT scan, Andrea Miller is breathing normally and VS WNL.  Pt states that Andrea Miller thought not breathing after contrast for a little while was normal. When asked if allergic or has any reaction to IV contrast patient stated no. LP    Amoxicillin Rash   Corticosteroids Palpitations   Penicillins Other (See Comments) and Rash    Has patient had a PCN reaction causing immediate rash, facial/tongue/throat swelling, SOB or lightheadedness with hypotension:Yes Has patient had a PCN reaction causing severe rash involving mucus membranes or skin necrosis:No Has patient had a PCN reaction that required hospitalization:No Has patient had a PCN reaction occurring within the last  10 years:Yes. If all of the above answers are "NO", then may proceed with Cephalosporin use.  Has patient had a PCN reaction causing immediate rash, facial/tongue/throat swelling, SOB or lightheadedness with hypotension:Yes Has patient had a PCN reaction causing severe rash involving mucus membranes or skin necrosis:No Has patient had a PCN reaction that required hospitalization:No Has patient had a PCN reaction occurring within the last 10 years:Yes. If all of the above answers are "NO", then may proceed with Cephalosporin use.   Patient does not remember Patient does not remember   Prior to Admission medications   Medication Sig Start Date End  Date Taking? Authorizing Provider  Adapalene (DIFFERIN) 0.3 % gel Apply 1 application. topically at bedtime. 03/28/22  Yes Willeen Niece, MD  albuterol (PROVENTIL) (2.5 MG/3ML) 0.083% nebulizer solution Take 3 mLs (2.5 mg total) by nebulization every 4 (four) hours as needed for wheezing or shortness of breath. 09/23/19  Yes Salena Saner, MD  Albuterol Sulfate (PROAIR RESPICLICK) 108 (90 Base) MCG/ACT AEPB Inhale 2 puffs into the lungs every 6 (six) hours as needed. 06/27/21  Yes Glenford Bayley, NP  amphetamine-dextroamphetamine (ADDERALL XR) 15 MG 24 hr capsule TAKE 1 CAPSULE BY MOUTH EACH MORNING 12/15/20  Yes [provider]  budesonide-formoterol (SYMBICORT) 160-4.5 MCG/ACT inhaler Inhale 2 puffs into the lungs 2 (two) times daily. 07/16/20  Yes Glenford Bayley, NP  budesonide-formoterol [facility]) 160-4.5 MCG/ACT inhaler Inhale 2 puffs into the lungs in the morning and at bedtime. 07/07/21  Yes Glenford Bayley, NP  buprenorphine Lavera Guise) 15 MCG/HR Place 1 patch onto the skin once a week. 10/01/20  Yes [provider]  Calcium-Magnesium 500-250 MG TABS Take 1 tablet by mouth 2 (two) times daily.    Yes [provider]  cyanocobalamin (,VITAMIN B-12,) 1000 MCG/ML injection Inject 1 mL (1,000 mcg total) into the muscle every 30 (thirty) days for 12 doses. 06/29/21 05/26/22 Yes Creig Hines, MD  Dexlansoprazole 30 MG capsule Take 30 mg by mouth daily.   Yes [provider]  diclofenac Sodium (VOLTAREN) 1 % GEL Apply 2 g topically 4 (four) times daily. 03/12/20  Yes Dartha Lodge, PA-C  diltiazem (CARDIZEM CD) 360 MG 24 hr capsule Take 360 mg by mouth every morning. 12/18/18  Yes [provider]  escitalopram (LEXAPRO) 20 MG tablet Take 20 mg by mouth daily.   Yes [provider]  fluticasone (FLONASE) 50 MCG/ACT nasal spray Place 2 sprays into both nostrils daily as needed for allergies. 07/16/20  Yes Glenford Bayley, NP  furosemide  (LASIX) 20 MG tablet Take 40 mg by mouth daily.   Yes [provider]  gabapentin (NEURONTIN) 800 MG tablet Take 800 mg by mouth 3 (three) times daily.   Yes [provider]  hydrocortisone 2.5 % cream APPLY TO ECZEMA ON FACE 1-2 TIMES A DAY AS NEEDED UNTIL RASH IMPROVED. 02/06/22  Yes Willeen Niece, MD  hydroxypropyl methylcellulose / hypromellose (ISOPTO TEARS / GONIOVISC) 2.5 % ophthalmic solution Place 1 drop into both eyes 3 (three) times daily as needed for dry eyes.   Yes [provider]  ipratropium (ATROVENT) 0.06 % nasal spray Place 2 sprays into both nostrils 4 (four) times daily. 09/19/21  Yes Eusebio Friendly B, PA-C  isosorbide mononitrate (IMDUR) 30 MG 24 hr tablet Take 30 mg by mouth daily. 03/08/20  Yes [provider]  ketoconazole (NIZORAL) 2 % cream Apply 1-2 times a day to itchy rash face, scalp, under breast  until clear, then prn flares. 03/28/22  Yes Brendolyn Patty, MD  ketoconazole (NIZORAL) 2 % shampoo Apply to scalp and let sit several minutes before rinsing 03/28/22  Yes Brendolyn Patty, MD  lactulose [facility]) 10 GM/15ML solution Take 30 g by mouth daily as needed for mild constipation. 08/28/18  Yes [provider]  lamoTRIgine (LAMICTAL) 200 MG tablet Take 1 tablet by mouth 2 (two) times a day. 05/20/19  Yes [provider]  linaclotide (LINZESS) 290 MCG CAPS capsule Take 290 mcg by mouth daily before breakfast.   Yes [provider]  Lurasidone HCl (LATUDA) 60 MG TABS Take by mouth.   Yes [provider]  mometasone (ELOCON) 0.1 % cream Apply to more severe areas eczema 1-2 times as directed. May use on face up to 1 week at a time. Avoid groin, underarms. 02/06/22  Yes Brendolyn Patty, MD  montelukast (SINGULAIR) 10 MG tablet Take 1 tablet (10 mg total) by mouth at bedtime. 07/16/20  Yes Martyn Ehrich, NP  pimecrolimus (ELIDEL) 1 % cream Apply topically to areas on face, body, body folds. 03/28/22  Yes Brendolyn Patty, MD  propranolol (INDERAL) 80 MG tablet Take 80 mg by mouth 3 (three) times daily.   Yes [provider]  rivaroxaban (XARELTO) 20 MG TABS tablet Take 1 tablet (20 mg total) by mouth daily with supper. 01/05/22  Yes Verlon Au, NP  rosuvastatin (CRESTOR) 20 MG tablet Take 20 mg by mouth daily.   Yes [provider]  SPIRIVA RESPIMAT 1.25 MCG/ACT AERS INHALE 2 PUFFS INTO THE LUNGS DAILY. 05/13/21  Yes Martyn Ehrich, NP  Topiramate ER (TROKENDI XR) 200 MG CP24 Take 1 capsule by mouth daily.    Yes [provider]  Potassium Chloride ER 20 MEQ TBCR Take 20 mEq by mouth 2 (two) times daily.  Patient not taking: Reported on 04/14/2022    [provider]  SYRINGE/NEEDLE, DISP, 1 ML (B-D 1CC SLIP TIP SYR 25GX5/8") 25G X 5/8" 1 ML MISC Inject 1 mL into the muscle once a week. 02/28/21   Lequita Asal, MD  dicyclomine (BENTYL) 10 MG capsule Take 1 capsule (10 mg total) by mouth every 6 (six) hours as needed for up to 7 days for spasms (abdominal pain). 04/18/20 08/03/20  Carrie Mew, MD    Review of Systems  Constitutional:  Positive for fatigue (easily). Negative for appetite change.  HENT:  Negative for congestion, postnasal drip and sore throat.   Eyes: Negative.   Respiratory:  Positive for chest tightness and shortness of breath. Negative for cough.   Cardiovascular:  Positive for leg swelling (worsening at times). Negative for chest pain and palpitations.  Gastrointestinal:  Positive for abdominal distention (at times). Negative for abdominal pain.  Endocrine: Negative.   Genitourinary: Negative.   Musculoskeletal:  Positive for arthralgias (right knee) and back pain (due to bulging disc).  Skin: Negative.   Allergic/Immunologic: Negative.   Neurological:  Positive for light-headedness. Negative for dizziness and headaches.  Hematological:  Negative for adenopathy. Does not bruise/bleed easily.  Psychiatric/Behavioral:  Positive for  dysphoric mood and sleep disturbance (sleeping on 1 pillow; wearing CPAP at night sometimes). The patient is not nervous/anxious.    Vitals:   04/14/22 1055  BP: (!) 156/69  Pulse: 84  Resp: 20  SpO2: 100%  Weight: (!) 398 lb 4 oz (180.6 kg)  Height: 5\' 10"  (1.778 m)   Wt Readings from Last 3 Encounters:  04/14/22 (!) 398  lb 4 oz (180.6 kg)  01/05/22 (!) 383 lb 6 oz (173.9 kg)  11/14/21 (!) 381 lb 4.8 oz (173 kg)   Lab Results  Component Value Date   CREATININE 0.52 02/24/2021   CREATININE 0.63 02/03/2021   CREATININE 0.74 08/03/2020    Physical Exam Vitals and nursing note reviewed.  Constitutional:      Appearance: Andrea Miller is well-developed.  HENT:     Head: Normocephalic and atraumatic.  Neck:     Vascular: No JVD.  Cardiovascular:     Rate and Rhythm: Normal rate and regular rhythm.  Pulmonary:     Effort: Pulmonary effort is normal. No respiratory distress.     Breath sounds: No wheezing or rales.  Abdominal:     General: There is no distension.     Palpations: Abdomen is soft.  Musculoskeletal:        General: No tenderness.     Cervical back: Normal range of motion and neck supple.     Right lower leg: No tenderness. Edema (1+ pitting) present.     Left lower leg: No tenderness. Edema (1+ pitting) present.  Skin:    General: Skin is warm and dry.  Neurological:     Mental Status: Andrea Miller is alert and oriented to person, place, and time.  Psychiatric:        Behavior: Behavior normal.      Assessment & Plan:   1: Chronic heart failure with preserved ejection fraction without structural changes- - NYHA class III - euvolemic today - weighing daily; reminded to call for an overnight weight gain of >2 pounds or a weekly weight gain of >5 pounds - weight up 26 pounds from last visit here 8 months ago - not adding salt and occasionally reads food labels; reviewed the importance of closely following a 2000mg  sodium diet  - saw cardiology ([facility]) 07/22/21 & then  [facility] cardiology on 07/27/21; says that Andrea Miller's continuing to look for someone that will listen to her and review her chart - was released from cardiology Khan's office due to numerious NS - drinking ~ 64 ounces of fluid daily - medication refills provided today - BNP 03/12/20 was 18.0  2: HTN- - BP mildly elevated 9156/69) - follows with PCP (Aycock) at [place] 02/24/21 reviewed and showed sodium 138, potassium 3.4, creatinine 0.52 and GFR >60  3: Obstructive sleep apnea- - wearing CPAP nightly and feels like Andrea Miller's sleeping well - had video visit with pulmonology Volanda Napoleon) 07/07/21  4: Lymphedema- - stage 2 - instructed to wear compression socks daily with removal at bedtime - instructed to elevate her legs when sitting for long periods of time - limited in her ability to exercise due to shortness of breath - consider compression boots if edema persists   Patient did not bring her medications nor a list. Each medication was verbally reviewed with the patient and Andrea Miller was encouraged to bring the bottles to every visit to confirm accuracy of list.   Return in 6 weeks or sooner for any questions/problems before then.

## 2022-04-14 ENCOUNTER — Ambulatory Visit: Payer: Medicare Other | Attending: Family | Admitting: Family

## 2022-04-14 ENCOUNTER — Other Ambulatory Visit: Payer: Self-pay | Admitting: Dermatology

## 2022-04-14 ENCOUNTER — Encounter: Payer: Self-pay | Admitting: Family

## 2022-04-14 VITALS — BP 156/69 | HR 84 | Resp 20 | Ht 70.0 in | Wt 398.2 lb

## 2022-04-14 DIAGNOSIS — J45909 Unspecified asthma, uncomplicated: Secondary | ICD-10-CM | POA: Insufficient documentation

## 2022-04-14 DIAGNOSIS — G4733 Obstructive sleep apnea (adult) (pediatric): Secondary | ICD-10-CM

## 2022-04-14 DIAGNOSIS — Z79891 Long term (current) use of opiate analgesic: Secondary | ICD-10-CM | POA: Insufficient documentation

## 2022-04-14 DIAGNOSIS — Z86711 Personal history of pulmonary embolism: Secondary | ICD-10-CM | POA: Insufficient documentation

## 2022-04-14 DIAGNOSIS — M5116 Intervertebral disc disorders with radiculopathy, lumbar region: Secondary | ICD-10-CM | POA: Diagnosis not present

## 2022-04-14 DIAGNOSIS — I5032 Chronic diastolic (congestive) heart failure: Secondary | ICD-10-CM | POA: Diagnosis present

## 2022-04-14 DIAGNOSIS — Z7901 Long term (current) use of anticoagulants: Secondary | ICD-10-CM | POA: Insufficient documentation

## 2022-04-14 DIAGNOSIS — I110 Hypertensive heart disease with heart failure: Secondary | ICD-10-CM | POA: Insufficient documentation

## 2022-04-14 DIAGNOSIS — F319 Bipolar disorder, unspecified: Secondary | ICD-10-CM | POA: Insufficient documentation

## 2022-04-14 DIAGNOSIS — Z9989 Dependence on other enabling machines and devices: Secondary | ICD-10-CM | POA: Diagnosis not present

## 2022-04-14 DIAGNOSIS — I890 Lymphedema, not elsewhere classified: Secondary | ICD-10-CM | POA: Diagnosis not present

## 2022-04-14 DIAGNOSIS — I10 Essential (primary) hypertension: Secondary | ICD-10-CM

## 2022-04-14 DIAGNOSIS — Z79899 Other long term (current) drug therapy: Secondary | ICD-10-CM | POA: Diagnosis not present

## 2022-04-14 DIAGNOSIS — Z7951 Long term (current) use of inhaled steroids: Secondary | ICD-10-CM | POA: Insufficient documentation

## 2022-04-14 DIAGNOSIS — L700 Acne vulgaris: Secondary | ICD-10-CM

## 2022-04-14 MED ORDER — POTASSIUM CHLORIDE ER 20 MEQ PO TBCR: 20.0000 meq | EXTENDED_RELEASE_TABLET | Freq: Two times a day (BID) | ORAL | 3 refills | Status: AC

## 2022-04-14 MED ORDER — FUROSEMIDE 40 MG PO TABS: 40.0000 mg | ORAL_TABLET | Freq: Every day | ORAL | 3 refills | Status: DC

## 2022-04-14 MED ORDER — DILTIAZEM HCL ER COATED BEADS 360 MG PO CP24: 360.0000 mg | ORAL_CAPSULE | Freq: Every morning | ORAL | 3 refills | Status: DC

## 2022-04-14 MED ORDER — PROPRANOLOL HCL 80 MG PO TABS: 80.0000 mg | ORAL_TABLET | Freq: Three times a day (TID) | ORAL | 3 refills | Status: DC

## 2022-04-14 NOTE — Patient Instructions (Addendum)
Continue weighing daily and call for an overnight weight gain of 3 pounds or more or a weekly weight gain of more than 5 pounds.   If you have voicemail, please make sure your mailbox is cleaned out so that we may leave a message and please make sure to listen to any voicemails.    Wear compression socks daily with elevation of legs

## 2022-04-18 ENCOUNTER — Other Ambulatory Visit: Payer: Self-pay | Admitting: Dermatology

## 2022-04-18 ENCOUNTER — Other Ambulatory Visit: Payer: Self-pay | Admitting: Oncology

## 2022-04-18 DIAGNOSIS — L700 Acne vulgaris: Secondary | ICD-10-CM

## 2022-04-19 ENCOUNTER — Encounter: Payer: Self-pay | Admitting: Hematology and Oncology

## 2022-04-26 ENCOUNTER — Other Ambulatory Visit: Payer: Self-pay

## 2022-04-26 ENCOUNTER — Telehealth: Payer: Self-pay | Admitting: Oncology

## 2022-04-26 MED ORDER — RIVAROXABAN 20 MG PO TABS: 20.0000 mg | ORAL_TABLET | Freq: Every day | ORAL | 1 refills | Status: DC

## 2022-04-26 NOTE — Telephone Encounter (Signed)
Pt called and asked if she could get a refill on her b12 and xarelto. I let her know that a clinical staff would give her a call back.

## 2022-05-04 ENCOUNTER — Encounter: Payer: Self-pay | Admitting: Hematology and Oncology

## 2022-05-10 ENCOUNTER — Other Ambulatory Visit: Payer: Self-pay | Admitting: *Deleted

## 2022-05-10 MED ORDER — CYANOCOBALAMIN 1000 MCG/ML IJ SOLN: 1000.0000 ug | INTRAMUSCULAR | 3 refills | Status: DC

## 2022-05-16 ENCOUNTER — Other Ambulatory Visit: Payer: Self-pay

## 2022-05-16 DIAGNOSIS — L209 Atopic dermatitis, unspecified: Secondary | ICD-10-CM

## 2022-05-16 DIAGNOSIS — R21 Rash and other nonspecific skin eruption: Secondary | ICD-10-CM

## 2022-05-16 DIAGNOSIS — L304 Erythema intertrigo: Secondary | ICD-10-CM

## 2022-05-16 DIAGNOSIS — L700 Acne vulgaris: Secondary | ICD-10-CM

## 2022-05-16 MED ORDER — ADAPALENE 0.3 % EX GEL: 1.0000 "application " | Freq: Every day | CUTANEOUS | 11 refills | Status: DC

## 2022-05-16 MED ORDER — PIMECROLIMUS 1 % EX CREA: TOPICAL_CREAM | CUTANEOUS | 11 refills | Status: DC

## 2022-05-16 MED ORDER — KETOCONAZOLE 2 % EX CREA: TOPICAL_CREAM | CUTANEOUS | 11 refills | Status: DC

## 2022-05-16 NOTE — Progress Notes (Signed)
[facility] faxed over refill request. Escripted back to them

## 2022-05-16 NOTE — Progress Notes (Signed)
[facility] faxed over refill request. Escripted over to them

## 2022-05-16 NOTE — Progress Notes (Signed)
[facility] refill request faxed to our office. Escripted over to them

## 2022-05-23 NOTE — Progress Notes (Signed)
Subjective:    Patient ID: [name], female    DOB: 1986/06/19, 36 y.o.   MRN: [id number]  HPI  Ms Dacey is a 36 y/o female with a history of asthma, HTN, SVT, obesity, bipolar, anxiety, depression, fibromyalgia, seizures, bulging disc and chronic heart failure.   Echo report from 09/16/18 reviewed and showed an EF of 55%. Echo report from 07/04/18 reviewed and showed an EF of 73%. Echo report from 04/15/17 reviewed and showed an EF of 60-65%.  Has not been admitted or been in the ED in the last 6 months.   She presents today for a follow-up visit with a chief complaint of moderate fatigue with minimal exertion. She describes this as chronic in nature. She has associated decreased appetite, blurry vision, chest tightness, shortness of breath, pedal edema, abdominal distention, chronic pain, light-headedness, depression and difficulty sleeping along with this. She denies any headaches, cough, chest pain or palpitations.   She says that she's been wearing compression socks but her leg edema has continued. Very little reduction even after having them elevated overnight.   Past Medical History:  Diagnosis Date   Anxiety    Asthma    Bipolar disorder (HCC)    Chest pain    and angina   CHF (congestive heart failure) (HCC)    Depression    Fibromyalgia    Hypertension    Left lumbar radiculopathy since 08/2014   secondary to work accident   Obesity    Pre-diabetes    Pulmonary embolus (HCC)    Seizures (HCC)    secondary to anxiety  last seizure 01/25/18   SVT (supraventricular tachycardia) (HCC)    with hx of syncope; managed by [facility]   Past Surgical History:  Procedure Laterality Date   CHOLECYSTECTOMY N/A 02/22/2018   Procedure: LAPAROSCOPIC CHOLECYSTECTOMY;  Surgeon: Ancil Linsey, MD;  Location: [facility] ORS;  Service: General;  Laterality: N/A;   TONSILLECTOMY AND ADENOIDECTOMY N/A 09/05/2016   Procedure: TONSILLECTOMY AND ADENOIDECTOMY;  Surgeon: Linus Salmons,  MD;  Location: [facility] ORS;  Service: ENT;  Laterality: N/A;   Family History  Problem Relation Age of Onset   Diabetes Mother    Hypertension Mother    Asthma Mother    Gallstones Mother    Diabetes Father    Hypertension Father    Gallstones Father    Bipolar disorder Sister    COPD Brother    Schizophrenia Brother    COPD Brother    Hypertension Brother    Social History   Tobacco Use   Smoking status: Never   Smokeless tobacco: Never  Substance Use Topics   Alcohol use: Not Currently   Allergies  Allergen Reactions   Garlic Anaphylaxis   Prednisone Swelling    Tongue swelling   Contrast Media [Iodinated Contrast Media]     Pt reports that "every time" she receives contrast she "can't breathe" for a short time. At time of arrival to room after CT scan, she is breathing normally and VS WNL.  Pt states that she thought not breathing after contrast for a little while was normal. When asked if allergic or has any reaction to IV contrast patient stated no. LP    Amoxicillin Rash   Corticosteroids Palpitations   Penicillins Other (See Comments) and Rash    Has patient had a PCN reaction causing immediate rash, facial/tongue/throat swelling, SOB or lightheadedness with hypotension:Yes Has patient had a PCN reaction causing severe rash involving mucus membranes or  skin necrosis:No Has patient had a PCN reaction that required hospitalization:No Has patient had a PCN reaction occurring within the last 10 years:Yes. If all of the above answers are "NO", then may proceed with Cephalosporin use.  Has patient had a PCN reaction causing immediate rash, facial/tongue/throat swelling, SOB or lightheadedness with hypotension:Yes Has patient had a PCN reaction causing severe rash involving mucus membranes or skin necrosis:No Has patient had a PCN reaction that required hospitalization:No Has patient had a PCN reaction occurring within the last 10 years:Yes. If all of the above answers are  "NO", then may proceed with Cephalosporin use.   Patient does not remember Patient does not remember   Prior to Admission medications   Medication Sig Start Date End Date Taking? Authorizing Provider  Adapalene (DIFFERIN) 0.3 % gel Apply 1 application  topically at bedtime. 05/16/22  Yes Willeen Niece, MD  Albuterol Sulfate (PROAIR RESPICLICK) 108 (90 Base) MCG/ACT AEPB Inhale 2 puffs into the lungs every 6 (six) hours as needed. 06/27/21  Yes Glenford Bayley, NP  amphetamine-dextroamphetamine (ADDERALL XR) 15 MG 24 hr capsule TAKE 1 CAPSULE BY MOUTH EACH MORNING 12/15/20  Yes [provider]  ARIPiprazole (ABILIFY) 20 MG tablet Take 1 tablet by mouth daily. 05/23/22  Yes [provider]  budesonide-formoterol (SYMBICORT) 160-4.5 MCG/ACT inhaler Inhale 2 puffs into the lungs in the morning and at bedtime. 07/07/21  Yes Glenford Bayley, NP  buprenorphine Lavera Guise) 15 MCG/HR Place 1 patch onto the skin once a week. 10/01/20  Yes [provider]  Calcium-Magnesium 500-250 MG TABS Take 1 tablet by mouth 2 (two) times daily.    Yes [provider]  cyanocobalamin (,VITAMIN B-12,) 1000 MCG/ML injection Inject 1 mL (1,000 mcg total) into the muscle every 30 (thirty) days for 12 doses. 05/10/22 04/06/23 Yes Creig Hines, MD  Dexlansoprazole 30 MG capsule Take 30 mg by mouth daily.   Yes [provider]  diclofenac Sodium (VOLTAREN) 1 % GEL Apply 2 g topically 4 (four) times daily. 03/12/20  Yes Dartha Lodge, PA-C  diltiazem (CARDIZEM CD) 360 MG 24 hr capsule Take 1 capsule (360 mg total) by mouth every morning. 04/14/22  Yes Azadeh Hyder, Inetta Fermo A, FNP  escitalopram (LEXAPRO) 20 MG tablet Take 20 mg by mouth daily.   Yes [provider]  fluticasone (FLONASE) 50 MCG/ACT nasal spray Place 2 sprays into both nostrils daily as needed for allergies. 07/16/20  Yes Glenford Bayley, NP  furosemide (LASIX) 40 MG tablet Take 1 tablet (40 mg total) by mouth daily.  04/14/22  Yes Adhvik Canady, Inetta Fermo A, FNP  gabapentin (NEURONTIN) 800 MG tablet Take 800 mg by mouth 3 (three) times daily.   Yes [provider]  hydrocortisone 2.5 % cream APPLY TO ECZEMA ON FACE 1-2 TIMES A DAY AS NEEDED UNTIL RASH IMPROVED. 02/06/22  Yes Willeen Niece, MD  hydroxypropyl methylcellulose / hypromellose (ISOPTO TEARS / GONIOVISC) 2.5 % ophthalmic solution Place 1 drop into both eyes 3 (three) times daily as needed for dry eyes.   Yes [provider]  isosorbide mononitrate (IMDUR) 30 MG 24 hr tablet Take 30 mg by mouth daily. 03/08/20  Yes [provider]  ketoconazole (NIZORAL) 2 % cream Apply 1-2 times a day to itchy rash face, scalp, under breast until clear, then prn flares. 05/16/22  Yes Willeen Niece, MD  ketoconazole (NIZORAL) 2 % shampoo Apply to scalp and let sit several minutes before rinsing 03/28/22  Yes Willeen Niece, MD  lactulose (CHRONULAC) 10 GM/15ML solution Take 30 g by mouth daily as needed for mild constipation. 08/28/18  Yes [provider]  lamoTRIgine (LAMICTAL) 200 MG tablet Take 1 tablet by mouth 2 (two) times a day. 05/20/19  Yes [provider]  linaclotide (LINZESS) 290 MCG CAPS capsule Take 290 mcg by mouth daily before breakfast.   Yes [provider]  Lurasidone HCl (LATUDA) 60 MG TABS Take 1 tablet by mouth in the morning.   Yes [provider]  mometasone (ELOCON) 0.1 % cream Apply to more severe areas eczema 1-2 times as directed. May use on face up to 1 week at a time. Avoid groin, underarms. 02/06/22  Yes Brendolyn Patty, MD  montelukast (SINGULAIR) 10 MG tablet Take 1 tablet (10 mg total) by mouth at bedtime. 07/16/20  Yes Martyn Ehrich, NP  olopatadine (PATANOL) 0.1 % ophthalmic solution 1 drop 2 (two) times daily. 05/23/22  Yes [provider]  pimecrolimus (ELIDEL) 1 % cream Apply topically to areas on face, body, body folds. 05/16/22  Yes Brendolyn Patty, MD  Potassium Chloride ER 20 MEQ  TBCR Take 20 mEq by mouth 2 (two) times daily. 04/14/22  Yes Alyah Boehning, Otila Kluver A, FNP  prazosin (MINIPRESS) 2 MG capsule Take 4 mg by mouth at bedtime. 04/18/22  Yes [provider]  propranolol (INDERAL) 80 MG tablet Take 1 tablet (80 mg total) by mouth 3 (three) times daily. 04/14/22  Yes Alisa Graff, FNP  rivaroxaban (XARELTO) 20 MG TABS tablet Take 1 tablet (20 mg total) by mouth daily with supper. 04/26/22  Yes Sindy Guadeloupe, MD  rosuvastatin (CRESTOR) 20 MG tablet Take 20 mg by mouth daily.   Yes [provider]  SPIRIVA RESPIMAT 1.25 MCG/ACT AERS INHALE 2 PUFFS INTO THE LUNGS DAILY. 05/13/21  Yes Martyn Ehrich, NP  SYRINGE/NEEDLE, DISP, 1 ML (B-D 1CC SLIP TIP SYR 25GX5/8") 25G X 5/8" 1 ML MISC Inject 1 mL into the muscle once a week. 02/28/21  Yes Corcoran, Drue Second, MD  tiZANidine (ZANAFLEX) 4 MG tablet Take 4 mg by mouth 3 (three) times daily. 05/23/22  Yes [provider]  Topiramate ER (TROKENDI XR) 200 MG CP24 Take 1 capsule by mouth daily.    Yes [provider]  venlafaxine XR (EFFEXOR-XR) 150 MG 24 hr capsule Take 150 mg by mouth daily. 03/01/22  Yes [provider]  albuterol (PROVENTIL) (2.5 MG/3ML) 0.083% nebulizer solution Take 3 mLs (2.5 mg total) by nebulization every 4 (four) hours as needed for wheezing or shortness of breath. Patient not taking: Reported on 05/24/2022 09/23/19   Tyler Pita, MD  ipratropium (ATROVENT) 0.06 % nasal spray Place 2 sprays into both nostrils 4 (four) times daily. Patient not taking: Reported on 05/24/2022 09/19/21   Laurene Footman B, PA-C  lidocaine (XYLOCAINE) 5 % ointment Apply 1 Application topically. 2-3 times per week 05/23/22   [provider]  LORazepam (ATIVAN) 1 MG tablet Take 1 tablet by mouth 2 (two) times daily. 05/23/22   [provider]  dicyclomine (BENTYL) 10 MG capsule Take 1 capsule (10 mg total) by mouth every 6 (six) hours as needed for up to 7 days for spasms  (abdominal pain). 04/18/20 08/03/20  Carrie Mew, MD    Review of Systems  Constitutional:  Positive for appetite change (decreased) and fatigue (easily).  HENT:  Negative for congestion, postnasal drip and sore throat.   Eyes:  Positive for visual disturbance (blurry vision).  Respiratory:  Positive for chest tightness and shortness of breath. Negative for cough.   Cardiovascular:  Positive for leg swelling (worsening at times). Negative for chest pain and palpitations.  Gastrointestinal:  Positive for abdominal distention (at times). Negative for abdominal pain.  Endocrine: Negative.   Genitourinary: Negative.   Musculoskeletal:  Positive for arthralgias ("all over") and back pain (due to bulging disc).  Skin: Negative.   Allergic/Immunologic: Negative.   Neurological:  Positive for light-headedness. Negative for dizziness and headaches.  Hematological:  Negative for adenopathy. Does not bruise/bleed easily.  Psychiatric/Behavioral:  Positive for dysphoric mood and sleep disturbance (sleeping on 1 pillow; wearing CPAP at night sometimes). The patient is not nervous/anxious.    Vitals:   05/24/22 1124  BP: 130/83  Pulse: 74  Resp: 18  SpO2: 100%  Weight: (!) 396 lb (179.6 kg)  Height: 5\' 9"  (1.753 m)   Wt Readings from Last 3 Encounters:  05/24/22 (!) 396 lb (179.6 kg)  04/14/22 (!) 398 lb 4 oz (180.6 kg)  01/05/22 (!) 383 lb 6 oz (173.9 kg)   Lab Results  Component Value Date   CREATININE 0.52 02/24/2021   CREATININE 0.63 02/03/2021   CREATININE 0.74 08/03/2020    Physical Exam Vitals and nursing note reviewed.  Constitutional:      Appearance: She is well-developed.  HENT:     Head: Normocephalic and atraumatic.  Neck:     Vascular: No JVD.  Cardiovascular:     Rate and Rhythm: Normal rate and regular rhythm.  Pulmonary:     Effort: Pulmonary effort is normal. No respiratory distress.     Breath sounds: No wheezing or rales.  Abdominal:     General: There is  no distension.     Palpations: Abdomen is soft.  Musculoskeletal:        General: No tenderness.     Cervical back: Normal range of motion and neck supple.     Right lower leg: No tenderness. Edema (2+ pitting) present.     Left lower leg: No tenderness. Edema (2+ pitting) present.  Skin:    General: Skin is warm and dry.  Neurological:     Mental Status: She is alert and oriented to person, place, and time.  Psychiatric:        Behavior: Behavior normal.      Assessment & Plan:   1: Chronic heart failure with preserved ejection fraction without structural changes- - NYHA class III - euvolemic today - not weighing daily but has working scales; encouraged to weigh daily and to call for an overnight weight gain of >2 pounds or a weekly weight gain of >5 pounds - weight down 2 pounds from last visit here 5 weeks ago - not adding salt and occasionally reads food labels; reviewed the importance of closely following a 2000mg  sodium diet; does say that she does eat french fries often from fast food places - saw cardiology ([facility]) 07/22/21 & then [facility] cardiology on 07/27/21; emphasized that she get a general cardiology appointment scheduled - echo order placed & scheduled for 06/19/22 - due to symptoms, advised her to take an extra furosemide 40mg  and extra 74meq potassium for the next 2 days - will check BMP today - was released from cardiology Khan's office due to numerous NS - drinking ~ 64 ounces of fluid daily - BNP 03/12/20 was 18.0 - PharmD reconciled medications with the patient  2: HTN- - BP mildly elevated (130/83) - follows with PCP Clide Deutscher) at [facility]  -  BMP 02/24/21 reviewed and showed sodium 138, potassium 3.4, creatinine 0.52 and GFR >60  3: Obstructive sleep apnea- - wearing CPAP nightly and feels like she's sleeping well - had video visit with pulmonology Volanda Napoleon) 07/07/21  4: Lymphedema- - stage 2 - wearing compression socks but no improvement in edema -  elevating her legs but without any improvement in edema - limited in her ability to exercise due to shortness of breath - will send in referral for compression boots   Patient did not bring her medications nor a list. Each medication was verbally reviewed with the patient and she was encouraged to bring the bottles to every visit to confirm accuracy of list.   Return in 1 month, sooner if needed.

## 2022-05-24 ENCOUNTER — Encounter: Payer: Self-pay | Admitting: Family

## 2022-05-24 ENCOUNTER — Ambulatory Visit: Payer: Medicare Other | Attending: Family | Admitting: Family

## 2022-05-24 VITALS — BP 130/83 | HR 74 | Resp 18 | Ht 69.0 in | Wt 396.0 lb

## 2022-05-24 DIAGNOSIS — I5032 Chronic diastolic (congestive) heart failure: Secondary | ICD-10-CM

## 2022-05-24 DIAGNOSIS — I110 Hypertensive heart disease with heart failure: Secondary | ICD-10-CM | POA: Insufficient documentation

## 2022-05-24 DIAGNOSIS — M797 Fibromyalgia: Secondary | ICD-10-CM | POA: Insufficient documentation

## 2022-05-24 DIAGNOSIS — I890 Lymphedema, not elsewhere classified: Secondary | ICD-10-CM

## 2022-05-24 DIAGNOSIS — E669 Obesity, unspecified: Secondary | ICD-10-CM | POA: Diagnosis not present

## 2022-05-24 DIAGNOSIS — J45909 Unspecified asthma, uncomplicated: Secondary | ICD-10-CM | POA: Insufficient documentation

## 2022-05-24 DIAGNOSIS — F419 Anxiety disorder, unspecified: Secondary | ICD-10-CM | POA: Insufficient documentation

## 2022-05-24 DIAGNOSIS — G4733 Obstructive sleep apnea (adult) (pediatric): Secondary | ICD-10-CM | POA: Insufficient documentation

## 2022-05-24 DIAGNOSIS — F319 Bipolar disorder, unspecified: Secondary | ICD-10-CM | POA: Insufficient documentation

## 2022-05-24 DIAGNOSIS — I10 Essential (primary) hypertension: Secondary | ICD-10-CM | POA: Diagnosis not present

## 2022-05-24 DIAGNOSIS — R5383 Other fatigue: Secondary | ICD-10-CM | POA: Diagnosis not present

## 2022-05-24 LAB — BASIC METABOLIC PANEL
Anion gap: 8 (ref 5–15)
BUN: 8 mg/dL (ref 6–20)
CO2: 27 mmol/L (ref 22–32)
Calcium: 9.3 mg/dL (ref 8.9–10.3)
Chloride: 104 mmol/L (ref 98–111)
Creatinine, Ser: 0.55 mg/dL (ref 0.44–1.00)
GFR, Estimated: >60 mL/min (ref >60)
Glucose, Bld: 100 mg/dL — ABNORMAL HIGH (ref 70–99)
Potassium: 3.6 mmol/L (ref 3.5–5.1)
Sodium: 139 mmol/L (ref 135–145)

## 2022-05-24 NOTE — Patient Instructions (Addendum)
Resume weighing daily and call for an overnight weight gain of 3 pounds or more or a weekly weight gain of more than 5 pounds.  If you have voicemail, please make sure your mailbox is cleaned out so that we may leave a message and please make sure to listen to any voicemails.   For today and tomorrow, take an extra furosemide and potassium.   Call cardiology to get an appointment scheduled.    I'll place a referral for the compression boots and they will call you

## 2022-05-29 ENCOUNTER — Other Ambulatory Visit: Payer: Self-pay | Admitting: Dermatology

## 2022-05-29 DIAGNOSIS — L209 Atopic dermatitis, unspecified: Secondary | ICD-10-CM

## 2022-06-19 ENCOUNTER — Ambulatory Visit: Admission: RE | Admit: 2022-06-19 | Payer: Medicare Other | Source: Ambulatory Visit

## 2022-06-22 ENCOUNTER — Ambulatory Visit: Payer: Medicare Other | Admitting: Family

## 2022-06-22 ENCOUNTER — Telehealth: Payer: Self-pay | Admitting: Family

## 2022-06-22 NOTE — Telephone Encounter (Signed)
Patient did not show for her [facility] appointment on 06/22/22. Will attempt to reschedule.

## 2022-06-22 NOTE — Progress Notes (Deleted)
Subjective:    Patient ID: Andrea Miller, female    DOB: 11/15/1986, 36 y.o.   MRN: [id number]  HPI  Ms Hoffart is a 36 y/o female with a history of asthma, HTN, SVT, obesity, bipolar, anxiety, depression, fibromyalgia, seizures, bulging disc and chronic heart failure.   Echo report from 09/16/18 reviewed and showed an EF of 55%. Echo report from 07/04/18 reviewed and showed an EF of 73%. Echo report from 04/15/17 reviewed and showed an EF of 60-65%.  Has not been admitted or been in the ED in the last 6 months.   She presents today for a follow-up visit with a chief complaint of   Past Medical History:  Diagnosis Date   Anxiety    Asthma    Bipolar disorder (HCC)    Chest pain    and angina   CHF (congestive heart failure) (HCC)    Depression    Fibromyalgia    Hypertension    Left lumbar radiculopathy since 08/2014   secondary to work accident   Obesity    Pre-diabetes    Pulmonary embolus (HCC)    Seizures (HCC)    secondary to anxiety  last seizure 01/25/18   SVT (supraventricular tachycardia) (HCC)    with hx of syncope; managed by [facility]   Past Surgical History:  Procedure Laterality Date   CHOLECYSTECTOMY N/A 02/22/2018   Procedure: LAPAROSCOPIC CHOLECYSTECTOMY;  Surgeon: Ancil Linsey, MD;  Location: [facility] ORS;  Service: General;  Laterality: N/A;   TONSILLECTOMY AND ADENOIDECTOMY N/A 09/05/2016   Procedure: TONSILLECTOMY AND ADENOIDECTOMY;  Surgeon: Linus Salmons, MD;  Location: [facility] ORS;  Service: ENT;  Laterality: N/A;   Family History  Problem Relation Age of Onset   Diabetes Mother    Hypertension Mother    Asthma Mother    Gallstones Mother    Diabetes Father    Hypertension Father    Gallstones Father    Bipolar disorder Sister    COPD Brother    Schizophrenia Brother    COPD Brother    Hypertension Brother    Social History   Tobacco Use   Smoking status: Never   Smokeless tobacco: Never  Substance Use Topics   Alcohol use: Not  Currently   Allergies  Allergen Reactions   Garlic Anaphylaxis   Prednisone Swelling    Tongue swelling   Contrast Media [Iodinated Contrast Media]     Pt reports that "every time" she receives contrast she "can't breathe" for a short time. At time of arrival to room after CT scan, she is breathing normally and VS WNL.  Pt states that she thought not breathing after contrast for a little while was normal. When asked if allergic or has any reaction to IV contrast patient stated no. LP    Amoxicillin Rash   Corticosteroids Palpitations   Penicillins Other (See Comments) and Rash    Has patient had a PCN reaction causing immediate rash, facial/tongue/throat swelling, SOB or lightheadedness with hypotension:Yes Has patient had a PCN reaction causing severe rash involving mucus membranes or skin necrosis:No Has patient had a PCN reaction that required hospitalization:No Has patient had a PCN reaction occurring within the last 10 years:Yes. If all of the above answers are "NO", then may proceed with Cephalosporin use.  Has patient had a PCN reaction causing immediate rash, facial/tongue/throat swelling, SOB or lightheadedness with hypotension:Yes Has patient had a PCN reaction causing severe rash involving mucus membranes or skin necrosis:No Has patient had  a PCN reaction that required hospitalization:No Has patient had a PCN reaction occurring within the last 10 years:Yes. If all of the above answers are "NO", then may proceed with Cephalosporin use.   Patient does not remember Patient does not remember     Review of Systems  Constitutional:  Positive for appetite change (decreased) and fatigue (easily).  HENT:  Negative for congestion, postnasal drip and sore throat.   Eyes:  Positive for visual disturbance (blurry vision).  Respiratory:  Positive for chest tightness and shortness of breath. Negative for cough.   Cardiovascular:  Positive for leg swelling (worsening at times).  Negative for chest pain and palpitations.  Gastrointestinal:  Positive for abdominal distention (at times). Negative for abdominal pain.  Endocrine: Negative.   Genitourinary: Negative.   Musculoskeletal:  Positive for arthralgias ("all over") and back pain (due to bulging disc).  Skin: Negative.   Allergic/Immunologic: Negative.   Neurological:  Positive for light-headedness. Negative for dizziness and headaches.  Hematological:  Negative for adenopathy. Does not bruise/bleed easily.  Psychiatric/Behavioral:  Positive for dysphoric mood and sleep disturbance (sleeping on 1 pillow; wearing CPAP at night sometimes). The patient is not nervous/anxious.       Physical Exam Vitals and nursing note reviewed.  Constitutional:      Appearance: She is well-developed.  HENT:     Head: Normocephalic and atraumatic.  Neck:     Vascular: No JVD.  Cardiovascular:     Rate and Rhythm: Normal rate and regular rhythm.  Pulmonary:     Effort: Pulmonary effort is normal. No respiratory distress.     Breath sounds: No wheezing or rales.  Abdominal:     General: There is no distension.     Palpations: Abdomen is soft.  Musculoskeletal:        General: No tenderness.     Cervical back: Normal range of motion and neck supple.     Right lower leg: No tenderness. Edema (2+ pitting) present.     Left lower leg: No tenderness. Edema (2+ pitting) present.  Skin:    General: Skin is warm and dry.  Neurological:     Mental Status: She is alert and oriented to person, place, and time.  Psychiatric:        Behavior: Behavior normal.      Assessment & Plan:   1: Chronic heart failure with preserved ejection fraction without structural changes- - NYHA class III - euvolemic today - not weighing daily but has working scales; encouraged to weigh daily and to call for an overnight weight gain of >2 pounds or a weekly weight gain of >5 pounds - weight 396 pounds from last visit here 1 month ago - not  adding salt and occasionally reads food labels; reviewed the importance of closely following a 2000mg  sodium diet; does say that she does eat french fries often from fast food places - saw cardiology ([facility]) 07/22/21 & then [facility] cardiology on 07/27/21; emphasized that she get a general cardiology appointment scheduled - NS for echo on 06/19/22 - was released from cardiology [facility]'s office due to numerous NS - drinking ~ 64 ounces of fluid daily - BNP 03/12/20 was 18.0  2: HTN- - BP  - follows with PCP (Aycock) at 03/14/20  - BMP 05/24/22 reviewed and showed sodium 139, potassium 3.6, creatinine 0.55 and GFR >60  3: Obstructive sleep apnea- - wearing CPAP nightly and feels like she's sleeping well - had video visit with pulmonology 05/26/22) 07/07/21  4: Lymphedema- - stage 2 - wearing compression socks but no improvement in edema - elevating her legs but without any improvement in edema - limited in her ability to exercise due to shortness of breath - will send in referral for compression boots   Patient did not bring her medications nor a list. Each medication was verbally reviewed with the patient and she was encouraged to bring the bottles to every visit to confirm accuracy of list.

## 2022-07-03 ENCOUNTER — Other Ambulatory Visit: Payer: Self-pay | Admitting: Dermatology

## 2022-07-03 DIAGNOSIS — L700 Acne vulgaris: Secondary | ICD-10-CM

## 2022-07-17 ENCOUNTER — Inpatient Hospital Stay: Payer: Medicare Other

## 2022-07-19 ENCOUNTER — Inpatient Hospital Stay: Payer: Medicare Other | Admitting: Oncology

## 2022-07-21 ENCOUNTER — Other Ambulatory Visit: Payer: Self-pay | Admitting: Dermatology

## 2022-07-21 DIAGNOSIS — L700 Acne vulgaris: Secondary | ICD-10-CM

## 2022-08-03 ENCOUNTER — Ambulatory Visit: Payer: Medicare Other | Admitting: Family

## 2022-08-18 ENCOUNTER — Other Ambulatory Visit: Payer: Self-pay | Admitting: Dermatology

## 2022-08-18 DIAGNOSIS — L209 Atopic dermatitis, unspecified: Secondary | ICD-10-CM

## 2022-08-20 NOTE — Progress Notes (Signed)
Subjective:    Patient ID: Andrea Miller, female    DOB: 1986/04/30, 36 y.o.   MRN: [id number]  HPI  Andrea Miller is a 36 y/o female with a history of asthma, HTN, SVT, obesity, bipolar, anxiety, depression, fibromyalgia, seizures, bulging disc and chronic heart failure.   Echo report from 09/16/18 reviewed and showed an EF of 55%. Echo report from 07/04/18 reviewed and showed an EF of 73%. Echo report from 04/15/17 reviewed and showed an EF of 60-65%.  Has not been admitted or been in the ED in the last 6 months.   She presents today for a follow-up visit with a chief complaint of moderate fatigue with minimal exertion. Describes this as chronic in nature having been present for several years. She has associated shortness of breath, pedal edema, abdominal distention, light-headedness, depression, difficulty sleeping, chronic pain and weight gain along with this.   She denies any palpitations, chest pain or cough.   Not wearing her CPAP nightly and not weighing herself daily. Is frustrated by her continued weight gain. Has tried a couple different supplements (Goli, essential oils) and some different diets (watermelon diet) and is on victoza but continues to gain weight. Says that she's doing "a little" bit of walking.   Past Medical History:  Diagnosis Date   Anxiety    Asthma    Bipolar disorder ([place])    Chest pain    and angina   CHF (congestive heart failure) (HCC)    Depression    Fibromyalgia    Hypertension    Left lumbar radiculopathy since 08/2014   secondary to work accident   Obesity    Pre-diabetes    Pulmonary embolus ([place])    Seizures ([place])    secondary to anxiety  last seizure 01/25/18   SVT (supraventricular tachycardia) ([place])    with hx of syncope; managed by [facility]   Past Surgical History:  Procedure Laterality Date   CHOLECYSTECTOMY N/A 02/22/2018   Procedure: LAPAROSCOPIC CHOLECYSTECTOMY;  Surgeon: Vickie Epley, MD;  Location: [facility] ORS;  Service:  General;  Laterality: N/A;   TONSILLECTOMY AND ADENOIDECTOMY N/A 09/05/2016   Procedure: TONSILLECTOMY AND ADENOIDECTOMY;  Surgeon: Beverly Gust, MD;  Location: [facility] ORS;  Service: ENT;  Laterality: N/A;   Family History  Problem Relation Age of Onset   Diabetes Mother    Hypertension Mother    Asthma Mother    Gallstones Mother    Diabetes Father    Hypertension Father    Gallstones Father    Bipolar disorder Sister    COPD Brother    Schizophrenia Brother    COPD Brother    Hypertension Brother    Social History   Tobacco Use   Smoking status: Never   Smokeless tobacco: Never  Substance Use Topics   Alcohol use: Not Currently   Allergies  Allergen Reactions   Garlic Anaphylaxis   Prednisone Swelling    Tongue swelling   Contrast Media [Iodinated Contrast Media]     Pt reports that "every time" she receives contrast she "can't breathe" for a short time. At time of arrival to room after CT scan, she is breathing normally and VS WNL.  Pt states that she thought not breathing after contrast for a little while was normal. When asked if allergic or has any reaction to IV contrast patient stated no. LP    Amoxicillin Rash   Corticosteroids Palpitations   Penicillins Other (See Comments) and Rash  Has patient had a PCN reaction causing immediate rash, facial/tongue/throat swelling, SOB or lightheadedness with hypotension:Yes Has patient had a PCN reaction causing severe rash involving mucus membranes or skin necrosis:No Has patient had a PCN reaction that required hospitalization:No Has patient had a PCN reaction occurring within the last 10 years:Yes. If all of the above answers are "NO", then may proceed with Cephalosporin use.  Has patient had a PCN reaction causing immediate rash, facial/tongue/throat swelling, SOB or lightheadedness with hypotension:Yes Has patient had a PCN reaction causing severe rash involving mucus membranes or skin necrosis:No Has patient had a  PCN reaction that required hospitalization:No Has patient had a PCN reaction occurring within the last 10 years:Yes. If all of the above answers are "NO", then may proceed with Cephalosporin use.   Patient does not remember Patient does not remember   Prior to Admission medications   Medication Sig Start Date End Date Taking? Authorizing Provider  Adapalene (DIFFERIN) 0.3 % gel Apply 1 application  topically at bedtime. 05/16/22  Yes Brendolyn Patty, MD  albuterol (PROVENTIL) (2.5 MG/3ML) 0.083% nebulizer solution Take 3 mLs (2.5 mg total) by nebulization every 4 (four) hours as needed for wheezing or shortness of breath. 09/23/19  Yes Tyler Pita, MD  Albuterol Sulfate (PROAIR RESPICLICK) 123XX123 (90 Base) MCG/ACT AEPB Inhale 2 puffs into the lungs every 6 (six) hours as needed. 06/27/21  Yes Martyn Ehrich, NP  amphetamine-dextroamphetamine (ADDERALL XR) 15 MG 24 hr capsule Patient says she is taking 25 mg daily now 12/15/20  Yes [provider]  ARIPiprazole (ABILIFY) 20 MG tablet Take 1 tablet by mouth daily. 05/23/22  Yes [provider]  budesonide-formoterol (SYMBICORT) 160-4.5 MCG/ACT inhaler Inhale 2 puffs into the lungs in the morning and at bedtime. 07/07/21  Yes Martyn Ehrich, NP  buprenorphine [facility]) 15 MCG/HR Place 1 patch onto the skin once a week. 10/01/20  Yes [provider]  Calcium-Magnesium 500-250 MG TABS Take 1 tablet by mouth 2 (two) times daily.    Yes [provider]  cyanocobalamin (,VITAMIN B-12,) 1000 MCG/ML injection Inject 1 mL (1,000 mcg total) into the muscle every 30 (thirty) days for 12 doses. 05/10/22 04/06/23 Yes Sindy Guadeloupe, MD  Dexlansoprazole 30 MG capsule Take 30 mg by mouth daily.   Yes [provider]  diclofenac Sodium (VOLTAREN) 1 % GEL Apply 2 g topically 4 (four) times daily. 03/12/20  Yes Jacqlyn Larsen, PA-C  diltiazem (CARDIZEM CD) 360 MG 24 hr capsule Take 1 capsule (360 mg total) by mouth every  morning. 04/14/22  Yes Liliahna Cudd, Otila Kluver A, FNP  escitalopram (LEXAPRO) 20 MG tablet Take 20 mg by mouth daily.   Yes [provider]  fluticasone (FLONASE) 50 MCG/ACT nasal spray Place 2 sprays into both nostrils daily as needed for allergies. 07/16/20  Yes Martyn Ehrich, NP  furosemide (LASIX) 40 MG tablet Take 1 tablet (40 mg total) by mouth daily. 04/14/22  Yes Aysia Lowder, Otila Kluver A, FNP  gabapentin (NEURONTIN) 800 MG tablet Take 800 mg by mouth 3 (three) times daily.   Yes [provider]  hydrocortisone 2.5 % cream APPLY TO ECZEMA ON FACE 1-2 TIMES A DAY AS NEEDED UNTIL RASH IMPROVED. 08/21/22  Yes Brendolyn Patty, MD  hydroxypropyl methylcellulose / hypromellose (ISOPTO TEARS / GONIOVISC) 2.5 % ophthalmic solution Place 1 drop into both eyes 3 (three) times daily as needed for dry eyes.   Yes [provider]  ipratropium (ATROVENT) 0.06 % nasal spray  Place 2 sprays into both nostrils 4 (four) times daily. 09/19/21  Yes Laurene Footman B, PA-C  isosorbide mononitrate (IMDUR) 30 MG 24 hr tablet Take 30 mg by mouth daily. 03/08/20  Yes [provider]  ketoconazole (NIZORAL) 2 % cream Apply 1-2 times a day to itchy rash face, scalp, under breast until clear, then prn flares. 05/16/22  Yes Brendolyn Patty, MD  ketoconazole (NIZORAL) 2 % shampoo Apply to scalp and let sit several minutes before rinsing 03/28/22  Yes Brendolyn Patty, MD  lactulose [facility]) 10 GM/15ML solution Take 30 g by mouth daily as needed for mild constipation. 08/28/18  Yes [provider]  lamoTRIgine (LAMICTAL) 200 MG tablet Take 1 tablet by mouth 2 (two) times a day. 05/20/19  Yes [provider]  lidocaine (XYLOCAINE) 5 % ointment Apply 1 Application topically. 2-3 times per week 05/23/22  Yes [provider]  linaclotide (LINZESS) 290 MCG CAPS capsule Take 290 mcg by mouth daily before breakfast.   Yes [provider]  LORazepam (ATIVAN) 1 MG tablet Take 1 tablet by mouth  2 (two) times daily. 05/23/22  Yes [provider]  Lurasidone HCl (LATUDA) 60 MG TABS Take 1 tablet by mouth in the morning.   Yes [provider]  mometasone (ELOCON) 0.1 % cream APPLY TO MORE SEVERE AREAS ECZEMA 1-2 TIMES AS DIRECTED. MAY USE ON FACE UP TO 1 WEEK AT A TIME. AVOID GROIN, UNDERARMS. 08/21/22  Yes Brendolyn Patty, MD  montelukast (SINGULAIR) 10 MG tablet Take 1 tablet (10 mg total) by mouth at bedtime. 07/16/20  Yes Martyn Ehrich, NP  olopatadine (PATANOL) 0.1 % ophthalmic solution 1 drop 2 (two) times daily. 05/23/22  Yes [provider]  pimecrolimus (ELIDEL) 1 % cream Apply topically to areas on face, body, body folds. 05/16/22  Yes Brendolyn Patty, MD  Potassium Chloride ER 20 MEQ TBCR Take 20 mEq by mouth 2 (two) times daily. 04/14/22  Yes Shaarav Ripple, Otila Kluver A, FNP  prazosin (MINIPRESS) 2 MG capsule Take 4 mg by mouth at bedtime. 04/18/22  Yes [provider]  propranolol (INDERAL) 80 MG tablet Take 1 tablet (80 mg total) by mouth 3 (three) times daily. 04/14/22  Yes Alisa Graff, FNP  rivaroxaban (XARELTO) 20 MG TABS tablet Take 1 tablet (20 mg total) by mouth daily with supper. 04/26/22  Yes Sindy Guadeloupe, MD  rosuvastatin (CRESTOR) 20 MG tablet Take 20 mg by mouth daily.   Yes [provider]  SPIRIVA RESPIMAT 1.25 MCG/ACT AERS INHALE 2 PUFFS INTO THE LUNGS DAILY. 05/13/21  Yes Martyn Ehrich, NP  SYRINGE/NEEDLE, DISP, 1 ML (B-D 1CC SLIP TIP SYR 25GX5/8") 25G X 5/8" 1 ML MISC Inject 1 mL into the muscle once a week. 02/28/21  Yes Corcoran, Drue Second, MD  tiZANidine (ZANAFLEX) 4 MG tablet Take 4 mg by mouth 3 (three) times daily. 05/23/22  Yes [provider]  Topiramate ER (TROKENDI XR) 200 MG CP24 Take 1 capsule by mouth daily.    Yes [provider]  venlafaxine XR (EFFEXOR-XR) 150 MG 24 hr capsule Take 150 mg by mouth daily. 03/01/22  Yes [provider]  VICTOZA 18 MG/3ML SOPN Inject 1.2 mg into the skin daily.  08/16/22  Yes [provider]  dicyclomine (BENTYL) 10 MG capsule Take 1 capsule (10 mg total) by mouth every 6 (six) hours as needed for up to 7 days for spasms (abdominal pain). 04/18/20 08/03/20  Carrie Mew, MD  Review of Systems  Constitutional:  Positive for fatigue (easily). Negative for appetite change.  HENT:  Negative for congestion, postnasal drip and sore throat.   Eyes:  Positive for visual disturbance (blurry vision).  Respiratory:  Positive for shortness of breath. Negative for cough and chest tightness.   Cardiovascular:  Positive for leg swelling (improving). Negative for chest pain and palpitations.  Gastrointestinal:  Positive for abdominal distention (at times). Negative for abdominal pain.  Endocrine: Negative.   Genitourinary: Negative.   Musculoskeletal:  Positive for arthralgias ("all over") and back pain (due to bulging disc).  Skin: Negative.   Allergic/Immunologic: Negative.   Neurological:  Positive for light-headedness. Negative for dizziness and headaches.  Hematological:  Negative for adenopathy. Does not bruise/bleed easily.  Psychiatric/Behavioral:  Positive for dysphoric mood and sleep disturbance (sleeping on 1 pillow; wearing CPAP at night sometimes). The patient is not nervous/anxious.    Vitals:   08/21/22 1010  BP: (!) 155/93  Pulse: 92  Resp: 20  SpO2: 99%  Weight: (!) 413 lb (187.3 kg)  Height: 5\' 10"  (1.778 m)   Wt Readings from Last 3 Encounters:  08/21/22 (!) 413 lb (187.3 kg)  05/24/22 (!) 396 lb (179.6 kg)  04/14/22 (!) 398 lb 4 oz (180.6 kg)   Lab Results  Component Value Date   CREATININE 0.55 05/24/2022   CREATININE 0.52 02/24/2021   CREATININE 0.63 02/03/2021    Physical Exam Vitals and nursing note reviewed.  Constitutional:      Appearance: She is well-developed.  HENT:     Head: Normocephalic and atraumatic.  Neck:     Vascular: No JVD.  Cardiovascular:     Rate and Rhythm: Normal rate and regular  rhythm.  Pulmonary:     Effort: Pulmonary effort is normal. No respiratory distress.     Breath sounds: No wheezing or rales.  Abdominal:     General: There is no distension.     Palpations: Abdomen is soft.  Musculoskeletal:        General: No tenderness.     Cervical back: Normal range of motion and neck supple.     Right lower leg: No tenderness. Edema (trace pitting) present.     Left lower leg: No tenderness. Edema (trace pitting) present.  Skin:    General: Skin is warm and dry.  Neurological:     Mental Status: She is alert and oriented to person, place, and time.  Psychiatric:        Behavior: Behavior normal.      Assessment & Plan:   1: Chronic heart failure with preserved ejection fraction without structural changes- - NYHA class III - euvolemic today - not weighing daily but has working scales; encouraged to weigh daily and to call for an overnight weight gain of >2 pounds or a weekly weight gain of >5 pounds - weight up 17 pounds from last visit here 3 months ago - not adding salt and occasionally reads food labels; reviewed the importance of closely following a 2000mg  sodium diet - admits to trying different weight loss aides (goli, oils) and trying different diets (watermelon) and walking some but continues to gain weight; encouraged to move more and try to balance out her diet - she may benefit from lifestyle center for nutrition counseling - saw cardiology ([facility]) 07/22/21 & then [facility] cardiology on 07/27/21; she says that she may have upcoming [facility] cardi[facility] appointmen but she's unsure - NS for echo on 06/19/22; this has been r/s for  09/13/22; emphasized that if she can't make this appointment that she call and cancel or r/s it - was released from cardiology Khan's office due to numerous NS - drinking too much fluid as she drinks 9-10 water bottles (16.9 oz) daily along with a lot of fruit; try to keep her daily fluid intake to 64 ounces - BNP 03/12/20 was  18.0  2: HTN- - BP elevated (155/93) but she hasn't taken any of her medications yet - follows with PCP Clide Deutscher) at [facility] and saw her last week and started victoza - BMP 05/24/22 reviewed and showed sodium 139, potassium 3.6, creatinine 0.55 and GFR >60  3: Obstructive sleep apnea- - wearing CPAP although not nightly; emphasized the importance of weighing it every night which would help with her feeling of fatigue - had video visit with pulmonology Volanda Napoleon) 07/07/21  4: Lymphedema- - stage 2 - wearing compression boots once daily with improvement of her leg swelling   Patient did not bring her medications nor a list. Each medication was verbally reviewed with the patient and she was encouraged to bring the bottles to every visit to confirm accuracy of list.   Return in 3 months, sooner if needed.

## 2022-08-21 ENCOUNTER — Ambulatory Visit: Payer: Medicare Other | Attending: Family | Admitting: Family

## 2022-08-21 ENCOUNTER — Encounter: Payer: Self-pay | Admitting: Family

## 2022-08-21 VITALS — BP 155/93 | HR 92 | Resp 20 | Ht 70.0 in | Wt >= 6400 oz

## 2022-08-21 DIAGNOSIS — G8929 Other chronic pain: Secondary | ICD-10-CM | POA: Insufficient documentation

## 2022-08-21 DIAGNOSIS — G4733 Obstructive sleep apnea (adult) (pediatric): Secondary | ICD-10-CM | POA: Diagnosis not present

## 2022-08-21 DIAGNOSIS — I110 Hypertensive heart disease with heart failure: Secondary | ICD-10-CM | POA: Diagnosis present

## 2022-08-21 DIAGNOSIS — R634 Abnormal weight loss: Secondary | ICD-10-CM | POA: Insufficient documentation

## 2022-08-21 DIAGNOSIS — J45909 Unspecified asthma, uncomplicated: Secondary | ICD-10-CM | POA: Diagnosis not present

## 2022-08-21 DIAGNOSIS — I10 Essential (primary) hypertension: Secondary | ICD-10-CM | POA: Diagnosis not present

## 2022-08-21 DIAGNOSIS — M7989 Other specified soft tissue disorders: Secondary | ICD-10-CM | POA: Diagnosis not present

## 2022-08-21 DIAGNOSIS — R42 Dizziness and giddiness: Secondary | ICD-10-CM | POA: Insufficient documentation

## 2022-08-21 DIAGNOSIS — I5032 Chronic diastolic (congestive) heart failure: Secondary | ICD-10-CM | POA: Diagnosis not present

## 2022-08-21 DIAGNOSIS — R140 Abdominal distension (gaseous): Secondary | ICD-10-CM | POA: Diagnosis not present

## 2022-08-21 DIAGNOSIS — F319 Bipolar disorder, unspecified: Secondary | ICD-10-CM | POA: Diagnosis not present

## 2022-08-21 DIAGNOSIS — I890 Lymphedema, not elsewhere classified: Secondary | ICD-10-CM | POA: Diagnosis not present

## 2022-08-21 DIAGNOSIS — R569 Unspecified convulsions: Secondary | ICD-10-CM | POA: Diagnosis not present

## 2022-08-21 DIAGNOSIS — I471 Supraventricular tachycardia: Secondary | ICD-10-CM | POA: Insufficient documentation

## 2022-08-21 DIAGNOSIS — F419 Anxiety disorder, unspecified: Secondary | ICD-10-CM | POA: Diagnosis not present

## 2022-08-21 DIAGNOSIS — E669 Obesity, unspecified: Secondary | ICD-10-CM | POA: Insufficient documentation

## 2022-08-21 DIAGNOSIS — M797 Fibromyalgia: Secondary | ICD-10-CM | POA: Diagnosis not present

## 2022-08-21 NOTE — Patient Instructions (Addendum)
Resume weighing daily and call for an overnight weight gain of 3 pounds or more or a weekly weight gain of more than 5 pounds.   If you have voicemail, please make sure your mailbox is cleaned out so that we may leave a message and please make sure to listen to any voicemails.    Please show up for your echo (heart ultrasound) next month. If you are unable to keep the appointment, please call and cancel it.

## 2022-08-24 ENCOUNTER — Encounter: Payer: Self-pay | Admitting: Hematology and Oncology

## 2022-08-31 ENCOUNTER — Encounter (INDEPENDENT_AMBULATORY_CARE_PROVIDER_SITE_OTHER): Payer: Medicare Other | Admitting: Internal Medicine

## 2022-09-04 ENCOUNTER — Ambulatory Visit: Payer: Medicare Other

## 2022-09-05 ENCOUNTER — Encounter (INDEPENDENT_AMBULATORY_CARE_PROVIDER_SITE_OTHER): Payer: Medicare Other | Admitting: Internal Medicine

## 2022-09-05 DIAGNOSIS — Z0289 Encounter for other administrative examinations: Secondary | ICD-10-CM

## 2022-09-13 ENCOUNTER — Ambulatory Visit: Payer: Medicare Other

## 2022-09-14 ENCOUNTER — Other Ambulatory Visit: Payer: Self-pay | Admitting: Dermatology

## 2022-09-14 DIAGNOSIS — L209 Atopic dermatitis, unspecified: Secondary | ICD-10-CM

## 2022-09-14 DIAGNOSIS — L219 Seborrheic dermatitis, unspecified: Secondary | ICD-10-CM

## 2022-10-05 ENCOUNTER — Ambulatory Visit: Admission: RE | Admit: 2022-10-05 | Payer: Medicare Other | Source: Ambulatory Visit

## 2022-10-23 ENCOUNTER — Telehealth: Payer: Self-pay | Admitting: Oncology

## 2022-10-23 NOTE — Telephone Encounter (Signed)
She will need to see one of the Nps this week and then get refill. She cancelled last appt

## 2022-10-23 NOTE — Telephone Encounter (Addendum)
Pt called and stated she would like to come in for labs, she also wants a 3 month refill for her B12 and Xarelto. Please advise.  I called the pt's number and got a voicemail. I left message that pt a message that she missed her appt in Aug. And Dr. Smith Robert would like to make a an appt for her and see a NP and then we will give a rx refill for xarelto and b12. Left my direct number to call me

## 2022-10-25 ENCOUNTER — Other Ambulatory Visit: Payer: Self-pay | Admitting: Dermatology

## 2022-10-25 DIAGNOSIS — L700 Acne vulgaris: Secondary | ICD-10-CM

## 2022-10-31 ENCOUNTER — Inpatient Hospital Stay: Payer: Medicare Other | Attending: Nurse Practitioner

## 2022-10-31 ENCOUNTER — Inpatient Hospital Stay: Payer: Medicare Other

## 2022-10-31 ENCOUNTER — Inpatient Hospital Stay (HOSPITAL_BASED_OUTPATIENT_CLINIC_OR_DEPARTMENT_OTHER): Payer: Medicare Other | Admitting: Nurse Practitioner

## 2022-10-31 ENCOUNTER — Encounter: Payer: Self-pay | Admitting: Nurse Practitioner

## 2022-10-31 VITALS — BP 146/96 | HR 82

## 2022-10-31 VITALS — BP 147/98 | HR 81 | Temp 98.3°F | Wt 399.0 lb

## 2022-10-31 DIAGNOSIS — Z880 Allergy status to penicillin: Secondary | ICD-10-CM | POA: Insufficient documentation

## 2022-10-31 DIAGNOSIS — Z833 Family history of diabetes mellitus: Secondary | ICD-10-CM | POA: Diagnosis not present

## 2022-10-31 DIAGNOSIS — Z7901 Long term (current) use of anticoagulants: Secondary | ICD-10-CM | POA: Insufficient documentation

## 2022-10-31 DIAGNOSIS — Z5181 Encounter for therapeutic drug level monitoring: Secondary | ICD-10-CM | POA: Diagnosis not present

## 2022-10-31 DIAGNOSIS — Z91041 Radiographic dye allergy status: Secondary | ICD-10-CM | POA: Diagnosis not present

## 2022-10-31 DIAGNOSIS — E538 Deficiency of other specified B group vitamins: Secondary | ICD-10-CM

## 2022-10-31 DIAGNOSIS — Z8249 Family history of ischemic heart disease and other diseases of the circulatory system: Secondary | ICD-10-CM | POA: Insufficient documentation

## 2022-10-31 DIAGNOSIS — D509 Iron deficiency anemia, unspecified: Secondary | ICD-10-CM

## 2022-10-31 DIAGNOSIS — Z9049 Acquired absence of other specified parts of digestive tract: Secondary | ICD-10-CM | POA: Insufficient documentation

## 2022-10-31 DIAGNOSIS — Z825 Family history of asthma and other chronic lower respiratory diseases: Secondary | ICD-10-CM | POA: Insufficient documentation

## 2022-10-31 DIAGNOSIS — Z888 Allergy status to other drugs, medicaments and biological substances status: Secondary | ICD-10-CM | POA: Insufficient documentation

## 2022-10-31 DIAGNOSIS — M7989 Other specified soft tissue disorders: Secondary | ICD-10-CM | POA: Insufficient documentation

## 2022-10-31 DIAGNOSIS — E611 Iron deficiency: Secondary | ICD-10-CM | POA: Diagnosis not present

## 2022-10-31 DIAGNOSIS — D6862 Lupus anticoagulant syndrome: Secondary | ICD-10-CM | POA: Insufficient documentation

## 2022-10-31 DIAGNOSIS — D519 Vitamin B12 deficiency anemia, unspecified: Secondary | ICD-10-CM | POA: Diagnosis not present

## 2022-10-31 DIAGNOSIS — Z818 Family history of other mental and behavioral disorders: Secondary | ICD-10-CM | POA: Insufficient documentation

## 2022-10-31 DIAGNOSIS — R5383 Other fatigue: Secondary | ICD-10-CM | POA: Insufficient documentation

## 2022-10-31 DIAGNOSIS — D500 Iron deficiency anemia secondary to blood loss (chronic): Secondary | ICD-10-CM | POA: Diagnosis not present

## 2022-10-31 DIAGNOSIS — Z79899 Other long term (current) drug therapy: Secondary | ICD-10-CM | POA: Diagnosis not present

## 2022-10-31 DIAGNOSIS — Z842 Family history of other diseases of the genitourinary system: Secondary | ICD-10-CM | POA: Diagnosis not present

## 2022-10-31 DIAGNOSIS — I509 Heart failure, unspecified: Secondary | ICD-10-CM | POA: Diagnosis not present

## 2022-10-31 DIAGNOSIS — Z86711 Personal history of pulmonary embolism: Secondary | ICD-10-CM | POA: Insufficient documentation

## 2022-10-31 LAB — CBC WITH DIFFERENTIAL/PLATELET
Abs Immature Granulocytes: 0.02 10*3/uL (ref 0.00–0.07)
Basophils Absolute: 0 10*3/uL (ref 0.0–0.1)
Basophils Relative: 1 %
Eosinophils Absolute: 0 10*3/uL (ref 0.0–0.5)
Eosinophils Relative: 1 %
HCT: 43 % (ref 36.0–46.0)
Hemoglobin: 13.5 g/dL (ref 12.0–15.0)
Immature Granulocytes: 1 %
Lymphocytes Relative: 49 %
Lymphs Abs: 2.2 10*3/uL (ref 0.7–4.0)
MCH: 26.4 pg (ref 26.0–34.0)
MCHC: 31.4 g/dL (ref 30.0–36.0)
MCV: 84 fL (ref 80.0–100.0)
Monocytes Absolute: 0.3 10*3/uL (ref 0.1–1.0)
Monocytes Relative: 8 %
Neutro Abs: 1.8 10*3/uL (ref 1.7–7.7)
Neutrophils Relative %: 40 %
Platelets: 257 10*3/uL (ref 150–400)
RBC: 5.12 MIL/uL — ABNORMAL HIGH (ref 3.87–5.11)
RDW: 13.8 % (ref 11.5–15.5)
WBC: 4.4 10*3/uL (ref 4.0–10.5)
nRBC: 0 % (ref 0.0–0.2)

## 2022-10-31 LAB — IRON AND TIBC
Iron: 67 ug/dL (ref 28–170)
Saturation Ratios: 17 % (ref 10.4–31.8)
TIBC: 388 ug/dL (ref 250–450)
UIBC: 321 ug/dL

## 2022-10-31 LAB — D-DIMER, QUANTITATIVE: D-Dimer, Quant: 0.36 ug/mL-FEU (ref 0.00–0.50)

## 2022-10-31 LAB — FERRITIN: Ferritin: 49 ng/mL (ref 11–307)

## 2022-10-31 LAB — VITAMIN B12: Vitamin B-12: 671 pg/mL (ref 180–914)

## 2022-10-31 MED ORDER — CYANOCOBALAMIN 1000 MCG/ML IJ SOLN: 1000.0000 ug | INTRAMUSCULAR | 1 refills | Status: DC

## 2022-10-31 MED ORDER — CYANOCOBALAMIN 1000 MCG/ML IJ SOLN
1000.0000 ug | Freq: Once | INTRAMUSCULAR | Status: AC
  Administered 2022-10-31: 1000 ug via INTRAMUSCULAR

## 2022-10-31 MED ORDER — "BD SYRINGE/NEEDLE SLIP TIP 25G X 5/8"" 1 ML MISC": 1 refills | Status: DC

## 2022-10-31 MED ORDER — RIVAROXABAN 20 MG PO TABS: 20.0000 mg | ORAL_TABLET | Freq: Every day | ORAL | 1 refills | Status: DC

## 2022-10-31 MED ORDER — SODIUM CHLORIDE 0.9 % IV SOLN
200.0000 mg | Freq: Once | INTRAVENOUS | Status: AC
  Administered 2022-10-31: 200 mg via INTRAVENOUS
  Filled 2022-10-31: qty 10

## 2022-10-31 MED ORDER — SODIUM CHLORIDE 0.9 % IV SOLN
Freq: Once | INTRAVENOUS | Status: AC
  Filled 2022-10-31: qty 250

## 2022-10-31 NOTE — Addendum Note (Signed)
Addended by: Alinda Dooms on: 10/31/2022 03:53 PM   Modules accepted: Orders

## 2022-10-31 NOTE — Progress Notes (Signed)
Hematology/Oncology Consult Note [facility]  [phone] [phone]  Patient Care Team: Emogene Morgan, MD as PCP - General (Family Medicine) Cassell Clement, MD as Referring Physician (Obstetrics and Gynecology) Creig Hines, MD as Medical Oncologist (Hematology and Oncology)   Name of the patient: Andrea Miller  [id number]  1986-01-06   Date of visit: 10/31/22  Diagnosis-history of pulmonary embolism and iron deficiency and B12 deficiency anemia  Chief complaint/ Reason for visit-routine follow-up of anemia  Heme/Onc history: Patient is a 36 year old African-American female with a history of bilateral pulmonary emboli that was diagnosed in October 2019.  Lower extremity Doppler at that time did not reveal any evidence of DVT.Hypercoagulable work-up on 10/11/2018 revealed the following negative studies: factor V Leiden, prothrombin gene mutation, anti-cardiolipin antibodies, and beta-2 glycoprotein antibodies.  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  Protein C, protein S, and ATIII were normal on 01/15/2019.  Lupus anticoagulant testing was negative on 04/24/2019.  Obesity and birth control pills were considered to be putative risk factors.  Patient is currently on Xarelto for this  She also has a history of iron and B12 deficiency anemia.  Etiology of iron deficiency has been attributed to heavy menstrual bleeding.  Interval history- Patient is 36 year old female with above history of PE and anemia. She continues home b12 injections. Reports increased fatigue. Has chronic lower extremity edema which is unchanged. Continues xarelto and denies missed doses or interval blood clots. She has restarted provera. Last received iv iron July 2022.   ECOG PS- 1 Pain scale- 0   Review of systems- Review of Systems  Constitutional:  Positive for malaise/fatigue. Negative for chills, fever and weight loss.  HENT:  Negative for  congestion, ear discharge and nosebleeds.   Eyes:  Negative for blurred vision.  Respiratory:  Negative for cough, hemoptysis, sputum production, shortness of breath and wheezing.   Cardiovascular:  Positive for leg swelling. Negative for chest pain, palpitations, orthopnea and claudication.  Gastrointestinal:  Negative for abdominal pain, blood in stool, constipation, diarrhea, heartburn, melena, nausea and vomiting.  Genitourinary:  Negative for dysuria, flank pain, frequency, hematuria and urgency.  Musculoskeletal:  Negative for back pain, joint pain and myalgias.  Skin:  Negative for rash.  Neurological:  Negative for dizziness, tingling, focal weakness, seizures, weakness and headaches.  Endo/Heme/Allergies:  Does not bruise/bleed easily.  Psychiatric/Behavioral:  Negative for depression and suicidal ideas. The patient does not have insomnia.      Allergies  Allergen Reactions   Garlic Anaphylaxis   Prednisone Swelling    Tongue swelling   Contrast Media [Iodinated Contrast Media]     Pt reports that "every time" she receives contrast she "can't breathe" for a short time. At time of arrival to room after CT scan, she is breathing normally and VS WNL.  Pt states that she thought not breathing after contrast for a little while was normal. When asked if allergic or has any reaction to IV contrast patient stated no. LP    Amoxicillin Rash   Corticosteroids Palpitations   Penicillins Other (See Comments) and Rash    Has patient had a PCN reaction causing immediate rash, facial/tongue/throat swelling, SOB or lightheadedness with hypotension:Yes Has patient had a PCN reaction causing severe rash involving mucus membranes or skin necrosis:No Has patient had a PCN reaction that required hospitalization:No Has patient had a PCN reaction occurring within the last 10 years:Yes. If all of the above answers are "NO", then  may proceed with Cephalosporin use.  Has patient had a PCN reaction  causing immediate rash, facial/tongue/throat swelling, SOB or lightheadedness with hypotension:Yes Has patient had a PCN reaction causing severe rash involving mucus membranes or skin necrosis:No Has patient had a PCN reaction that required hospitalization:No Has patient had a PCN reaction occurring within the last 10 years:Yes. If all of the above answers are "NO", then may proceed with Cephalosporin use.   Patient does not remember Patient does not remember     Past Medical History:  Diagnosis Date   Anxiety    Asthma    Bipolar disorder (HCC)    Chest pain    and angina   CHF (congestive heart failure) (HCC)    Depression    Fibromyalgia    Hypertension    Left lumbar radiculopathy since 08/2014   secondary to work accident   Obesity    Pre-diabetes    Pulmonary embolus (HCC)    Seizures (HCC)    secondary to anxiety  last seizure 01/25/18   SVT (supraventricular tachycardia)    with hx of syncope; managed by [facility]     Past Surgical History:  Procedure Laterality Date   CHOLECYSTECTOMY N/A 02/22/2018   Procedure: LAPAROSCOPIC CHOLECYSTECTOMY;  Surgeon: Ancil Linsey, MD;  Location: [facility] ORS;  Service: General;  Laterality: N/A;   TONSILLECTOMY AND ADENOIDECTOMY N/A 09/05/2016   Procedure: TONSILLECTOMY AND ADENOIDECTOMY;  Surgeon: Linus Salmons, MD;  Location: [facility] ORS;  Service: ENT;  Laterality: N/A;    Social History   Socioeconomic History   Marital status: Divorced    Spouse name: Not on file   Number of children: Not on file   Years of education: Not on file   Highest education level: Not on file  Occupational History   Not on file  Tobacco Use   Smoking status: Never   Smokeless tobacco: Never  Vaping Use   Vaping Use: Never used  Substance and Sexual Activity   Alcohol use: Not Currently   Drug use: No   Sexual activity: Yes    Birth control/protection: None  Other Topics Concern   Not on file  Social History Narrative   Not on file    Social Determinants of Health   Financial Resource Strain: Not on file  Food Insecurity: Not on file  Transportation Needs: Not on file  Physical Activity: Not on file  Stress: Not on file  Social Connections: Not on file  Intimate Partner Violence: Not on file    Family History  Problem Relation Age of Onset   Diabetes Mother    Hypertension Mother    Asthma Mother    Gallstones Mother    Diabetes Father    Hypertension Father    Gallstones Father    Bipolar disorder Sister    COPD Brother    Schizophrenia Brother    COPD Brother    Hypertension Brother      Current Outpatient Medications:    Adapalene (DIFFERIN) 0.3 % gel, Apply 1 application  topically at bedtime., Disp: 45 g, Rfl: 11   albuterol (PROVENTIL) (2.5 MG/3ML) 0.083% nebulizer solution, Take 3 mLs (2.5 mg total) by nebulization every 4 (four) hours as needed for wheezing or shortness of breath., Disp: 360 mL, Rfl: 0   Albuterol Sulfate (PROAIR RESPICLICK) 108 (90 Base) MCG/ACT AEPB, Inhale 2 puffs into the lungs every 6 (six) hours as needed., Disp: 1 each, Rfl: 0   amphetamine-dextroamphetamine (ADDERALL XR) 15 MG 24 hr  capsule, Patient says she is taking 25 mg daily now, Disp: , Rfl:    ARIPiprazole (ABILIFY) 20 MG tablet, Take 1 tablet by mouth daily., Disp: , Rfl:    benzonatate (TESSALON) 100 MG capsule, Take 100 mg by mouth 3 (three) times daily as needed., Disp: , Rfl:    budesonide-formoterol (SYMBICORT) 160-4.5 MCG/ACT inhaler, Inhale 2 puffs into the lungs in the morning and at bedtime., Disp: 1 each, Rfl: 6   buprenorphine (BUTRANS) 15 MCG/HR, Place 1 patch onto the skin once a week., Disp: , Rfl:    busPIRone (BUSPAR) 15 MG tablet, , Disp: , Rfl:    Calcium-Magnesium 500-250 MG TABS, Take 1 tablet by mouth 2 (two) times daily. , Disp: , Rfl:    cetirizine (ZYRTEC) 10 MG tablet, Take 10 mg by mouth daily as needed., Disp: , Rfl:    cyanocobalamin (,VITAMIN B-12,) 1000 MCG/ML injection, Inject 1 mL  (1,000 mcg total) into the muscle every 30 (thirty) days for 12 doses., Disp: 3 mL, Rfl: 3   Dexlansoprazole 30 MG capsule, Take 30 mg by mouth daily., Disp: , Rfl:    diazepam (VALIUM) 10 MG tablet, Take 10 mg by mouth daily as needed., Disp: , Rfl:    diclofenac Sodium (VOLTAREN) 1 % GEL, Apply 2 g topically 4 (four) times daily., Disp: 100 g, Rfl: 0   diltiazem (CARDIZEM CD) 360 MG 24 hr capsule, Take 1 capsule (360 mg total) by mouth every morning., Disp: 90 capsule, Rfl: 3   escitalopram (LEXAPRO) 20 MG tablet, Take 20 mg by mouth daily., Disp: , Rfl:    fluticasone (FLONASE) 50 MCG/ACT nasal spray, Place 2 sprays into both nostrils daily as needed for allergies., Disp: 16 g, Rfl: 1   folic acid (FOLVITE) 1 MG tablet, Take 1 mg by mouth daily., Disp: , Rfl:    furosemide (LASIX) 40 MG tablet, Take 1 tablet (40 mg total) by mouth daily., Disp: 90 tablet, Rfl: 3   gabapentin (NEURONTIN) 800 MG tablet, Take 800 mg by mouth 3 (three) times daily., Disp: , Rfl:    GUAIASORB DM 100-10 MG/5ML liquid, Take 5 mLs by mouth 4 (four) times daily as needed., Disp: , Rfl:    hydrocortisone 2.5 % cream, APPLY TO ECZEMA ON FACE 1-2 TIMES A DAY AS NEEDED UNTIL RASH IMPROVED., Disp: 28 g, Rfl: 0   hydroxypropyl methylcellulose / hypromellose (ISOPTO TEARS / GONIOVISC) 2.5 % ophthalmic solution, Place 1 drop into both eyes 3 (three) times daily as needed for dry eyes., Disp: , Rfl:    ipratropium (ATROVENT) 0.06 % nasal spray, Place 2 sprays into both nostrils 4 (four) times daily., Disp: 15 mL, Rfl: 0   isosorbide mononitrate (IMDUR) 30 MG 24 hr tablet, Take 30 mg by mouth daily., Disp: , Rfl:    ketoconazole (NIZORAL) 2 % cream, Apply 1-2 times a day to itchy rash face, scalp, under breast until clear, then prn flares., Disp: 60 g, Rfl: 11   ketoconazole (NIZORAL) 2 % shampoo, WASH SCALP 1-2 TIMES A WEEK, LET SIT 5 MINUTES AND RINSE OUT, Disp: 120 mL, Rfl: 3   [employer] PEN NEEDLES 31G X 6 MM MISC,  SMARTSIG:pre-filled pen syringe injection Daily, Disp: , Rfl:    lactulose (CHRONULAC) 10 GM/15ML solution, Take 30 g by mouth daily as needed for mild constipation., Disp: , Rfl: 5   lamoTRIgine (LAMICTAL) 200 MG tablet, Take 1 tablet by mouth 2 (two) times a day., Disp: , Rfl:  lidocaine (XYLOCAINE) 5 % ointment, Apply 1 Application topically. 2-3 times per week, Disp: , Rfl:    linaclotide (LINZESS) 290 MCG CAPS capsule, Take 290 mcg by mouth daily before breakfast., Disp: , Rfl:    LORazepam (ATIVAN) 1 MG tablet, Take 1 tablet by mouth 2 (two) times daily., Disp: , Rfl:    Lurasidone HCl (LATUDA) 60 MG TABS, Take 1 tablet by mouth in the morning., Disp: , Rfl:    meclizine (ANTIVERT) 25 MG tablet, , Disp: , Rfl:    medroxyPROGESTERone (PROVERA) 5 MG tablet, Take 5 mg by mouth daily., Disp: , Rfl:    mometasone (ELOCON) 0.1 % cream, APPLY TO MORE SEVERE AREAS ECZEMA 1-2 TIMES AS DIRECTED. MAY USE ON FACE UP TO 1 WEEK AT A TIME. AVOID GROIN, UNDERARMS., Disp: 45 g, Rfl: 3   montelukast (SINGULAIR) 10 MG tablet, Take 1 tablet (10 mg total) by mouth at bedtime., Disp: 90 tablet, Rfl: 1   nortriptyline (PAMELOR) 25 MG capsule, , Disp: , Rfl:    nystatin (MYCOSTATIN) 100000 UNIT/ML suspension, , Disp: , Rfl:    olopatadine (PATANOL) 0.1 % ophthalmic solution, 1 drop 2 (two) times daily., Disp: , Rfl:    ondansetron (ZOFRAN) 8 MG tablet, Take 8 mg by mouth 3 (three) times daily., Disp: , Rfl:    pimecrolimus (ELIDEL) 1 % cream, Apply topically to areas on face, body, body folds., Disp: 100 g, Rfl: 11   potassium chloride (KLOR-CON M) 10 MEQ tablet, Take 10 mEq by mouth 2 (two) times daily., Disp: , Rfl:    Potassium Chloride ER 20 MEQ TBCR, Take 20 mEq by mouth 2 (two) times daily., Disp: 180 tablet, Rfl: 3   prazosin (MINIPRESS) 2 MG capsule, Take 4 mg by mouth at bedtime., Disp: , Rfl:    propranolol (INDERAL) 80 MG tablet, Take 1 tablet (80 mg total) by mouth 3 (three) times daily., Disp: 270  tablet, Rfl: 3   rivaroxaban (XARELTO) 20 MG TABS tablet, Take 1 tablet (20 mg total) by mouth daily with supper., Disp: 90 tablet, Rfl: 1   rosuvastatin (CRESTOR) 20 MG tablet, Take 20 mg by mouth daily., Disp: , Rfl:    SPIRIVA RESPIMAT 1.25 MCG/ACT AERS, INHALE 2 PUFFS INTO THE LUNGS DAILY., Disp: 4 g, Rfl: 0   SYRINGE/NEEDLE, DISP, 1 ML (B-D 1CC SLIP TIP SYR 25GX5/8") 25G X 5/8" 1 ML MISC, Inject 1 mL into the muscle once a week., Disp: 1 each, Rfl: 3   tiZANidine (ZANAFLEX) 4 MG tablet, Take 4 mg by mouth 3 (three) times daily., Disp: , Rfl:    Topiramate ER (TROKENDI XR) 200 MG CP24, Take 1 capsule by mouth daily. , Disp: , Rfl:    tretinoin (RETIN-A) 0.025 % cream, Apply topically at bedtime as needed., Disp: , Rfl:    venlafaxine XR (EFFEXOR-XR) 150 MG 24 hr capsule, Take 150 mg by mouth daily., Disp: , Rfl:    VICTOZA 18 MG/3ML SOPN, Inject 1.2 mg into the skin daily., Disp: , Rfl:    WEGOVY 2.4 MG/0.75ML SOAJ, SMARTSIG:2.4 Milligram(s) SUB-Q Once a Week, Disp: , Rfl:  No current facility-administered medications for this visit.  Facility-Administered Medications Ordered in Other Visits:    0.9 %  sodium chloride infusion, , Intravenous, Once, Corcoran, Melissa C, MD   iron sucrose (VENOFER) injection 200 mg, 200 mg, Intravenous, Once, Creig Hines, MD  Physical exam:  Vitals:   10/31/22 1349  BP: (!) 147/98  Pulse: 81  Temp:  98.3 F (36.8 C)  TempSrc: Tympanic  Weight: (!) 399 lb (181 kg)    Physical Exam Constitutional:      Appearance: She is obese.  Cardiovascular:     Rate and Rhythm: Normal rate and regular rhythm.     Heart sounds: Normal heart sounds.  Pulmonary:     Effort: Pulmonary effort is normal.     Breath sounds: Normal breath sounds.  Abdominal:     Palpations: Abdomen is soft.  Musculoskeletal:     Comments: Trace bilateral edema  Skin:    General: Skin is warm and dry.     Coloration: Skin is not pale.  Neurological:     Mental Status: She is  alert and oriented to person, place, and time.  Psychiatric:        Mood and Affect: Mood normal.        Behavior: Behavior normal.         Latest Ref Rng & Units 05/24/2022   12:25 PM  CMP  Glucose 70 - 99 mg/dL 321   BUN 6 - 20 mg/dL 8   Creatinine 2.24 - 8.25 mg/dL 0.03   Sodium 704 - 888 mmol/L 139   Potassium 3.5 - 5.1 mmol/L 3.6   Chloride 98 - 111 mmol/L 104   CO2 22 - 32 mmol/L 27   Calcium 8.9 - 10.3 mg/dL 9.3       Latest Ref Rng & Units 10/31/2022    1:40 PM  CBC  WBC 4.0 - 10.5 K/uL 4.4   Hemoglobin 12.0 - 15.0 g/dL 91.6   Hematocrit 94.5 - 46.0 % 43.0   Platelets 150 - 400 K/uL 257    Iron/TIBC/Ferritin/ %Sat    Component Value Date/Time   IRON 67 10/31/2022 1340   TIBC 388 10/31/2022 1340   FERRITIN 49 10/31/2022 1340   IRONPCTSAT 17 10/31/2022 1340     Assessment and plan- Patient is a 36 y.o. female with  History of small bilateral PE- October 2019. Thought to be secondary to OCPs and obesity. Hypercoagulable workup was negative. She has been on xarelto since 2019. Previously discussed stopping anticoagulation if her d dimer levels were less than 250. She was hesitant to stop d/t ongoing bilateral lower extremity edema which I explained may be chronic, and that she remains obese. D Dimer has normalized. Continue xarelto. Symptomatically, she is stable. Refill of xarelto provided.  B12 Deficiency- last b12 was 450 in 01/05/22. Today's level pending. She receives b12 injections monthly at home. Continue. Refilled.  Iron Deficiency Anemia- Hemoglobin is normal. Hmg 13.5. Ferritin pending at time of visit. Improved, ferritin 49, iron sat 17%. Symptomatic. One dose of venofer today.    Disposition:  6 mo- lab then day to week later see Dr. Smith Robert; patient can do virtual visit If preferred   Visit Diagnosis 1. Iron deficiency anemia, unspecified iron deficiency anemia type   2. B12 deficiency   3. Anticoagulation management encounter    Consuello Masse, DNP,  AGNP-[facility] [phone] (clinic) 10/31/2022

## 2022-11-08 ENCOUNTER — Ambulatory Visit: Payer: Medicare Other | Admitting: Family

## 2022-11-10 ENCOUNTER — Ambulatory Visit: Payer: Medicare Other

## 2022-11-14 ENCOUNTER — Other Ambulatory Visit: Payer: Self-pay | Admitting: Dermatology

## 2022-11-14 DIAGNOSIS — L209 Atopic dermatitis, unspecified: Secondary | ICD-10-CM

## 2022-11-30 ENCOUNTER — Encounter: Payer: Self-pay | Admitting: Oncology

## 2022-12-07 ENCOUNTER — Ambulatory Visit: Payer: Medicaid Other

## 2022-12-21 ENCOUNTER — Other Ambulatory Visit: Payer: Self-pay | Admitting: Dermatology

## 2022-12-21 DIAGNOSIS — L209 Atopic dermatitis, unspecified: Secondary | ICD-10-CM

## 2022-12-31 ENCOUNTER — Encounter: Payer: Self-pay | Admitting: Oncology

## 2023-01-29 ENCOUNTER — Other Ambulatory Visit: Payer: Self-pay | Admitting: *Deleted

## 2023-01-29 DIAGNOSIS — D509 Iron deficiency anemia, unspecified: Secondary | ICD-10-CM

## 2023-01-29 DIAGNOSIS — E538 Deficiency of other specified B group vitamins: Secondary | ICD-10-CM

## 2023-01-30 ENCOUNTER — Other Ambulatory Visit: Payer: Self-pay | Admitting: Nurse Practitioner

## 2023-01-30 ENCOUNTER — Inpatient Hospital Stay: Payer: Medicare HMO | Admitting: Nurse Practitioner

## 2023-01-30 ENCOUNTER — Inpatient Hospital Stay: Payer: Medicare HMO

## 2023-01-30 DIAGNOSIS — E538 Deficiency of other specified B group vitamins: Secondary | ICD-10-CM

## 2023-01-30 DIAGNOSIS — D509 Iron deficiency anemia, unspecified: Secondary | ICD-10-CM

## 2023-01-31 ENCOUNTER — Other Ambulatory Visit: Payer: Self-pay | Admitting: *Deleted

## 2023-01-31 DIAGNOSIS — D509 Iron deficiency anemia, unspecified: Secondary | ICD-10-CM

## 2023-02-02 ENCOUNTER — Inpatient Hospital Stay: Payer: Medicare HMO | Admitting: Nurse Practitioner

## 2023-02-02 ENCOUNTER — Inpatient Hospital Stay: Payer: Medicare HMO

## 2023-02-02 ENCOUNTER — Encounter: Payer: Self-pay | Admitting: Nurse Practitioner

## 2023-02-05 ENCOUNTER — Encounter: Payer: Self-pay | Admitting: Nurse Practitioner

## 2023-02-05 ENCOUNTER — Inpatient Hospital Stay (HOSPITAL_BASED_OUTPATIENT_CLINIC_OR_DEPARTMENT_OTHER): Payer: Medicare HMO | Admitting: Nurse Practitioner

## 2023-02-05 ENCOUNTER — Inpatient Hospital Stay: Payer: Medicare HMO | Attending: Nurse Practitioner

## 2023-02-05 VITALS — BP 146/99 | HR 77 | Temp 97.5°F | Wt >= 6400 oz

## 2023-02-05 DIAGNOSIS — Z5181 Encounter for therapeutic drug level monitoring: Secondary | ICD-10-CM

## 2023-02-05 DIAGNOSIS — Z86711 Personal history of pulmonary embolism: Secondary | ICD-10-CM | POA: Insufficient documentation

## 2023-02-05 DIAGNOSIS — Z825 Family history of asthma and other chronic lower respiratory diseases: Secondary | ICD-10-CM | POA: Diagnosis not present

## 2023-02-05 DIAGNOSIS — E538 Deficiency of other specified B group vitamins: Secondary | ICD-10-CM

## 2023-02-05 DIAGNOSIS — M797 Fibromyalgia: Secondary | ICD-10-CM | POA: Diagnosis not present

## 2023-02-05 DIAGNOSIS — I10 Essential (primary) hypertension: Secondary | ICD-10-CM | POA: Diagnosis not present

## 2023-02-05 DIAGNOSIS — Z9049 Acquired absence of other specified parts of digestive tract: Secondary | ICD-10-CM | POA: Insufficient documentation

## 2023-02-05 DIAGNOSIS — D649 Anemia, unspecified: Secondary | ICD-10-CM | POA: Insufficient documentation

## 2023-02-05 DIAGNOSIS — Z833 Family history of diabetes mellitus: Secondary | ICD-10-CM | POA: Insufficient documentation

## 2023-02-05 DIAGNOSIS — Z880 Allergy status to penicillin: Secondary | ICD-10-CM | POA: Insufficient documentation

## 2023-02-05 DIAGNOSIS — Z8249 Family history of ischemic heart disease and other diseases of the circulatory system: Secondary | ICD-10-CM | POA: Diagnosis not present

## 2023-02-05 DIAGNOSIS — Z818 Family history of other mental and behavioral disorders: Secondary | ICD-10-CM | POA: Insufficient documentation

## 2023-02-05 DIAGNOSIS — D509 Iron deficiency anemia, unspecified: Secondary | ICD-10-CM

## 2023-02-05 DIAGNOSIS — Z7901 Long term (current) use of anticoagulants: Secondary | ICD-10-CM

## 2023-02-05 DIAGNOSIS — D500 Iron deficiency anemia secondary to blood loss (chronic): Secondary | ICD-10-CM

## 2023-02-05 DIAGNOSIS — G8929 Other chronic pain: Secondary | ICD-10-CM | POA: Diagnosis not present

## 2023-02-05 DIAGNOSIS — Z79899 Other long term (current) drug therapy: Secondary | ICD-10-CM | POA: Insufficient documentation

## 2023-02-05 DIAGNOSIS — Z888 Allergy status to other drugs, medicaments and biological substances status: Secondary | ICD-10-CM | POA: Insufficient documentation

## 2023-02-05 LAB — CBC WITH DIFFERENTIAL/PLATELET
Abs Immature Granulocytes: 0.01 10*3/uL (ref 0.00–0.07)
Basophils Absolute: 0 10*3/uL (ref 0.0–0.1)
Basophils Relative: 0 %
Eosinophils Absolute: 0.1 10*3/uL (ref 0.0–0.5)
Eosinophils Relative: 2 %
HCT: 40.9 % (ref 36.0–46.0)
Hemoglobin: 12.7 g/dL (ref 12.0–15.0)
Immature Granulocytes: 0 %
Lymphocytes Relative: 50 %
Lymphs Abs: 2.4 10*3/uL (ref 0.7–4.0)
MCH: 25.8 pg — ABNORMAL LOW (ref 26.0–34.0)
MCHC: 31.1 g/dL (ref 30.0–36.0)
MCV: 83 fL (ref 80.0–100.0)
Monocytes Absolute: 0.4 10*3/uL (ref 0.1–1.0)
Monocytes Relative: 7 %
Neutro Abs: 1.9 10*3/uL (ref 1.7–7.7)
Neutrophils Relative %: 41 %
Platelets: 258 10*3/uL (ref 150–400)
RBC: 4.93 MIL/uL (ref 3.87–5.11)
RDW: 14.3 % (ref 11.5–15.5)
WBC: 4.8 10*3/uL (ref 4.0–10.5)
nRBC: 0 % (ref 0.0–0.2)

## 2023-02-05 LAB — COMPREHENSIVE METABOLIC PANEL
ALT: 8 U/L (ref 0–44)
AST: 15 U/L (ref 15–41)
Albumin: 4.1 g/dL (ref 3.5–5.0)
Alkaline Phosphatase: 77 U/L (ref 38–126)
Anion gap: 7 (ref 5–15)
BUN: 9 mg/dL (ref 6–20)
CO2: 28 mmol/L (ref 22–32)
Calcium: 9.5 mg/dL (ref 8.9–10.3)
Chloride: 101 mmol/L (ref 98–111)
Creatinine, Ser: 0.72 mg/dL (ref 0.44–1.00)
GFR, Estimated: >60 mL/min (ref >60)
Glucose, Bld: 114 mg/dL — ABNORMAL HIGH (ref 70–99)
Potassium: 3.7 mmol/L (ref 3.5–5.1)
Sodium: 136 mmol/L (ref 135–145)
Total Bilirubin: 0.4 mg/dL (ref 0.3–1.2)
Total Protein: 8 g/dL (ref 6.5–8.1)

## 2023-02-05 LAB — IRON AND TIBC
Iron: 74 ug/dL (ref 28–170)
Saturation Ratios: 19 % (ref 10.4–31.8)
TIBC: 382 ug/dL (ref 250–450)
UIBC: 308 ug/dL

## 2023-02-05 LAB — VITAMIN B12: Vitamin B-12: 436 pg/mL (ref 180–914)

## 2023-02-05 LAB — FERRITIN: Ferritin: 79 ng/mL (ref 11–307)

## 2023-02-05 MED ORDER — "BD SAFETYGLIDE SYRINGE/NEEDLE 25G X 1"" 3 ML MISC": 1 refills | Status: DC

## 2023-02-05 MED ORDER — CYANOCOBALAMIN 1000 MCG/ML IJ SOLN: 1000.0000 ug | INTRAMUSCULAR | 3 refills | Status: AC

## 2023-02-05 NOTE — Progress Notes (Signed)
Hematology/Oncology Consult Note [facility]  Telephone:([phone] [phone]  Patient Care Team: Donnie Coffin, MD as PCP - General (Family Medicine) Elgie Collard, MD as Referring Physician (Obstetrics and Gynecology) Sindy Guadeloupe, MD as Medical Oncologist (Hematology and Oncology)   Name of the patient: Andrea Miller  IN:[id number]  04/09/1986   Date of visit: 02/05/23  Diagnosis-history of pulmonary embolism and iron deficiency and B12 deficiency anemia  Chief complaint/ Reason for visit-routine follow-up of anemia  Heme/Onc history: Patient is a 37 year old African-American female with a history of bilateral pulmonary emboli that was diagnosed in October 2019.  Lower extremity Doppler at that time did not reveal any evidence of DVT.Hypercoagulable work-up on 10/11/2018 revealed the following negative studies: factor V Leiden, prothrombin gene mutation, anti-cardiolipin antibodies, and beta-2 glycoprotein antibodies.  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  Protein C, protein S, and ATIII were normal on 01/15/2019.  Lupus anticoagulant testing was negative on 04/24/2019.  Obesity and birth control pills were considered to be putative risk factors.  Patient is currently on Xarelto for this  She also has a history of iron and B12 deficiency anemia.  Etiology of iron deficiency has been attributed to heavy menstrual bleeding.  Interval history- Patient is 37 year old female with above history of PE and anemia. She continues home b12 injections. Reports increased fatigue which is unchanged. Has ongoing issues with chronic pain and possibly needs surgery for knee and back. Has chronic lower extremity edema which is unchanged. Continues xarelto and denies missed doses or interval blood clots. She has restarted provera. Last received iv iron July 2022. Her continues to have some heavy periods but overall improved. No black or bloody  stools.   ECOG PS- 1 Pain scale- 5- back   Review of systems- Review of Systems  Constitutional:  Positive for malaise/fatigue. Negative for chills, fever and weight loss.  HENT:  Negative for congestion, ear discharge and nosebleeds.   Eyes:  Negative for blurred vision.  Respiratory:  Negative for cough, hemoptysis, sputum production, shortness of breath and wheezing.   Cardiovascular:  Positive for leg swelling. Negative for chest pain, palpitations, orthopnea and claudication.  Gastrointestinal:  Negative for abdominal pain, blood in stool, constipation, diarrhea, heartburn, melena, nausea and vomiting.  Genitourinary:  Negative for dysuria, flank pain, frequency, hematuria and urgency.  Musculoskeletal:  Positive for back pain. Negative for joint pain and myalgias.  Skin:  Negative for rash.  Neurological:  Negative for dizziness, tingling, focal weakness, seizures, weakness and headaches.  Endo/Heme/Allergies:  Does not bruise/bleed easily.  Psychiatric/Behavioral:  Negative for depression and suicidal ideas. The patient does not have insomnia.      Allergies  Allergen Reactions   Garlic Anaphylaxis   Prednisone Swelling    Tongue swelling   Contrast Media [Iodinated Contrast Media]     Pt reports that "every time" she receives contrast she "can't breathe" for a short time. At time of arrival to room after CT scan, she is breathing normally and VS WNL.  Pt states that she thought not breathing after contrast for a little while was normal. When asked if allergic or has any reaction to IV contrast patient stated no. LP    Amoxicillin Rash   Corticosteroids Palpitations   Penicillins Other (See Comments) and Rash    Has patient had a PCN reaction causing immediate rash, facial/tongue/throat swelling, SOB or lightheadedness with hypotension:Yes Has patient had a PCN reaction causing severe rash involving  mucus membranes or skin necrosis:No Has patient had a PCN reaction that  required hospitalization:No Has patient had a PCN reaction occurring within the last 10 years:Yes. If all of the above answers are "NO", then may proceed with Cephalosporin use.  Has patient had a PCN reaction causing immediate rash, facial/tongue/throat swelling, SOB or lightheadedness with hypotension:Yes Has patient had a PCN reaction causing severe rash involving mucus membranes or skin necrosis:No Has patient had a PCN reaction that required hospitalization:No Has patient had a PCN reaction occurring within the last 10 years:Yes. If all of the above answers are "NO", then may proceed with Cephalosporin use.   Patient does not remember Patient does not remember     Past Medical History:  Diagnosis Date   Anxiety    Asthma    Bipolar disorder ([place])    Chest pain    and angina   CHF (congestive heart failure) (HCC)    Depression    Fibromyalgia    Hypertension    Left lumbar radiculopathy since 08/2014   secondary to work accident   Obesity    Pre-diabetes    Pulmonary embolus ([place])    Seizures ([place])    secondary to anxiety  last seizure 01/25/18   SVT (supraventricular tachycardia)    with hx of syncope; managed by [facility]     Past Surgical History:  Procedure Laterality Date   CHOLECYSTECTOMY N/A 02/22/2018   Procedure: LAPAROSCOPIC CHOLECYSTECTOMY;  Surgeon: Vickie Epley, MD;  Location: [facility] ORS;  Service: General;  Laterality: N/A;   TONSILLECTOMY AND ADENOIDECTOMY N/A 09/05/2016   Procedure: TONSILLECTOMY AND ADENOIDECTOMY;  Surgeon: Beverly Gust, MD;  Location: [facility] ORS;  Service: ENT;  Laterality: N/A;    Social History   Socioeconomic History   Marital status: Divorced    Spouse name: Not on file   Number of children: Not on file   Years of education: Not on file   Highest education level: Not on file  Occupational History   Not on file  Tobacco Use   Smoking status: Never   Smokeless tobacco: Never  Vaping Use   Vaping Use: Never used   Substance and Sexual Activity   Alcohol use: Not Currently   Drug use: No   Sexual activity: Yes    Birth control/protection: None  Other Topics Concern   Not on file  Social History Narrative   Not on file   Social Determinants of Health   Financial Resource Strain: Not on file  Food Insecurity: Not on file  Transportation Needs: Not on file  Physical Activity: Not on file  Stress: Not on file  Social Connections: Not on file  Intimate Partner Violence: Not on file    Family History  Problem Relation Age of Onset   Diabetes Mother    Hypertension Mother    Asthma Mother    Gallstones Mother    Diabetes Father    Hypertension Father    Gallstones Father    Bipolar disorder Sister    COPD Brother    Schizophrenia Brother    COPD Brother    Hypertension Brother      Current Outpatient Medications:    Adapalene (DIFFERIN) 0.3 % gel, Apply 1 application  topically at bedtime., Disp: 45 g, Rfl: 11   albuterol (PROVENTIL) (2.5 MG/3ML) 0.083% nebulizer solution, Take 3 mLs (2.5 mg total) by nebulization every 4 (four) hours as needed for wheezing or shortness of breath., Disp: 360 mL, Rfl: 0  Albuterol Sulfate (PROAIR RESPICLICK) 123XX123 (90 Base) MCG/ACT AEPB, Inhale 2 puffs into the lungs every 6 (six) hours as needed., Disp: 1 each, Rfl: 0   amphetamine-dextroamphetamine (ADDERALL XR) 15 MG 24 hr capsule, Patient says she is taking 25 mg daily now, Disp: , Rfl:    ARIPiprazole (ABILIFY) 20 MG tablet, Take 1 tablet by mouth daily., Disp: , Rfl:    benzonatate (TESSALON) 100 MG capsule, Take 100 mg by mouth 3 (three) times daily as needed., Disp: , Rfl:    budesonide-formoterol (SYMBICORT) 160-4.5 MCG/ACT inhaler, Inhale 2 puffs into the lungs in the morning and at bedtime., Disp: 1 each, Rfl: 6   buprenorphine (BUTRANS) 15 MCG/HR, Place 1 patch onto the skin once a week., Disp: , Rfl:    busPIRone (BUSPAR) 15 MG tablet, , Disp: , Rfl:    Calcium-Magnesium 500-250 MG TABS,  Take 1 tablet by mouth 2 (two) times daily. , Disp: , Rfl:    cetirizine (ZYRTEC) 10 MG tablet, Take 10 mg by mouth daily as needed., Disp: , Rfl:    cyanocobalamin (VITAMIN B12) 1000 MCG/ML injection, Inject 1 mL (1,000 mcg total) into the muscle every 30 (thirty) days for 6 doses., Disp: 3 mL, Rfl: 1   Dexlansoprazole 30 MG capsule, Take 30 mg by mouth daily., Disp: , Rfl:    diazepam (VALIUM) 10 MG tablet, Take 10 mg by mouth daily as needed., Disp: , Rfl:    diclofenac Sodium (VOLTAREN) 1 % GEL, Apply 2 g topically 4 (four) times daily., Disp: 100 g, Rfl: 0   diltiazem (CARDIZEM CD) 360 MG 24 hr capsule, Take 1 capsule (360 mg total) by mouth every morning., Disp: 90 capsule, Rfl: 3   escitalopram (LEXAPRO) 20 MG tablet, Take 20 mg by mouth daily., Disp: , Rfl:    fluticasone (FLONASE) 50 MCG/ACT nasal spray, Place 2 sprays into both nostrils daily as needed for allergies., Disp: 16 g, Rfl: 1   folic acid (FOLVITE) 1 MG tablet, Take 1 mg by mouth daily., Disp: , Rfl:    furosemide (LASIX) 40 MG tablet, Take 1 tablet (40 mg total) by mouth daily., Disp: 90 tablet, Rfl: 3   gabapentin (NEURONTIN) 800 MG tablet, Take 800 mg by mouth 3 (three) times daily., Disp: , Rfl:    GUAIASORB DM 100-10 MG/5ML liquid, Take 5 mLs by mouth 4 (four) times daily as needed., Disp: , Rfl:    hydrocortisone 2.5 % cream, APPLY TO ECZEMA ON FACE 1-2 TIMES A DAY AS NEEDED UNTIL RASH IMPROVED., Disp: 28 g, Rfl: 0   hydroxypropyl methylcellulose / hypromellose (ISOPTO TEARS / GONIOVISC) 2.5 % ophthalmic solution, Place 1 drop into both eyes 3 (three) times daily as needed for dry eyes., Disp: , Rfl:    ipratropium (ATROVENT) 0.06 % nasal spray, Place 2 sprays into both nostrils 4 (four) times daily., Disp: 15 mL, Rfl: 0   isosorbide mononitrate (IMDUR) 30 MG 24 hr tablet, Take 30 mg by mouth daily., Disp: , Rfl:    ketoconazole (NIZORAL) 2 % cream, Apply 1-2 times a day to itchy rash face, scalp, under breast until clear,  then prn flares., Disp: 60 g, Rfl: 11   ketoconazole (NIZORAL) 2 % shampoo, WASH SCALP 1-2 TIMES A WEEK, LET SIT 5 MINUTES AND RINSE OUT, Disp: 120 mL, Rfl: 3   [employer] PEN NEEDLES 31G X 6 MM MISC, SMARTSIG:pre-filled pen syringe injection Daily, Disp: , Rfl:    lactulose (CHRONULAC) 10 GM/15ML solution, Take 30 g  by mouth daily as needed for mild constipation., Disp: , Rfl: 5   lamoTRIgine (LAMICTAL) 200 MG tablet, Take 1 tablet by mouth 2 (two) times a day., Disp: , Rfl:    lidocaine (XYLOCAINE) 5 % ointment, Apply 1 Application topically. 2-3 times per week, Disp: , Rfl:    linaclotide (LINZESS) 290 MCG CAPS capsule, Take 290 mcg by mouth daily before breakfast., Disp: , Rfl:    LORazepam (ATIVAN) 1 MG tablet, Take 1 tablet by mouth 2 (two) times daily., Disp: , Rfl:    Lurasidone HCl (LATUDA) 60 MG TABS, Take 1 tablet by mouth in the morning., Disp: , Rfl:    meclizine (ANTIVERT) 25 MG tablet, , Disp: , Rfl:    medroxyPROGESTERone (PROVERA) 5 MG tablet, Take 5 mg by mouth daily., Disp: , Rfl:    mometasone (ELOCON) 0.1 % cream, APPLY TO MORE SEVERE AREAS ECZEMA 1-2 TIMES AS DIRECTED. MAY USE ON FACE UP TO 1 WEEK AT A TIME. AVOID GROIN, UNDERARMS., Disp: 45 g, Rfl: 3   montelukast (SINGULAIR) 10 MG tablet, Take 1 tablet (10 mg total) by mouth at bedtime., Disp: 90 tablet, Rfl: 1   nortriptyline (PAMELOR) 25 MG capsule, , Disp: , Rfl:    nystatin (MYCOSTATIN) 100000 UNIT/ML suspension, , Disp: , Rfl:    olopatadine (PATANOL) 0.1 % ophthalmic solution, 1 drop 2 (two) times daily., Disp: , Rfl:    ondansetron (ZOFRAN) 8 MG tablet, Take 8 mg by mouth 3 (three) times daily., Disp: , Rfl:    pimecrolimus (ELIDEL) 1 % cream, Apply topically to areas on face, body, body folds., Disp: 100 g, Rfl: 11   potassium chloride (KLOR-CON M) 10 MEQ tablet, Take 10 mEq by mouth 2 (two) times daily., Disp: , Rfl:    Potassium Chloride ER 20 MEQ TBCR, Take 20 mEq by mouth 2 (two) times daily., Disp: 180 tablet, Rfl:  3   prazosin (MINIPRESS) 2 MG capsule, Take 4 mg by mouth at bedtime., Disp: , Rfl:    propranolol (INDERAL) 80 MG tablet, Take 1 tablet (80 mg total) by mouth 3 (three) times daily., Disp: 270 tablet, Rfl: 3   rivaroxaban (XARELTO) 20 MG TABS tablet, Take 1 tablet (20 mg total) by mouth daily with supper., Disp: 90 tablet, Rfl: 1   rosuvastatin (CRESTOR) 20 MG tablet, Take 20 mg by mouth daily., Disp: , Rfl:    SPIRIVA RESPIMAT 1.25 MCG/ACT AERS, INHALE 2 PUFFS INTO THE LUNGS DAILY., Disp: 4 g, Rfl: 0   SYRINGE/NEEDLE, DISP, 1 ML (B-D 1CC SLIP TIP SYR 25GX5/8") 25G X 5/8" 1 ML MISC, Use needle and syringe to administer 1 ml of b12 into muscle every 30 days., Disp: 3 each, Rfl: 1   tiZANidine (ZANAFLEX) 4 MG tablet, Take 4 mg by mouth 3 (three) times daily., Disp: , Rfl:    Topiramate ER (TROKENDI XR) 200 MG CP24, Take 1 capsule by mouth daily. , Disp: , Rfl:    tretinoin (RETIN-A) 0.025 % cream, FOR ACNE APPLY TO FACE NIGHTLY AS TOLERATED., Disp: 45 g, Rfl: 5   venlafaxine XR (EFFEXOR-XR) 150 MG 24 hr capsule, Take 150 mg by mouth daily., Disp: , Rfl:    VICTOZA 18 MG/3ML SOPN, Inject 1.2 mg into the skin daily., Disp: , Rfl:    WEGOVY 2.4 MG/0.75ML SOAJ, SMARTSIG:2.4 Milligram(s) SUB-Q Once a Week, Disp: , Rfl:  No current facility-administered medications for this visit.  Facility-Administered Medications Ordered in Other Visits:    iron sucrose (VENOFER)  injection 200 mg, 200 mg, Intravenous, Once, Sindy Guadeloupe, MD  Physical exam:  Vitals:   02/05/23 1455  BP: (!) 146/99  Pulse: 77  Temp: (!) 97.5 F (36.4 C)  TempSrc: Tympanic  Weight: (!) 405 lb (183.7 kg)    Physical Exam Constitutional:      Appearance: She is obese.  Cardiovascular:     Rate and Rhythm: Normal rate and regular rhythm.     Heart sounds: Normal heart sounds.  Pulmonary:     Effort: Pulmonary effort is normal.     Breath sounds: Normal breath sounds.  Abdominal:     Palpations: Abdomen is soft.   Musculoskeletal:     Comments: Trace bilateral edema  Skin:    General: Skin is warm and dry.     Coloration: Skin is not pale.  Neurological:     Mental Status: She is alert and oriented to person, place, and time.  Psychiatric:        Mood and Affect: Mood normal.        Behavior: Behavior normal.        Latest Ref Rng & Units 02/05/2023    2:37 PM  CMP  Glucose 70 - 99 mg/dL 114   BUN 6 - 20 mg/dL 9   Creatinine 0.44 - 1.00 mg/dL 0.72   Sodium 135 - 145 mmol/L 136   Potassium 3.5 - 5.1 mmol/L 3.7   Chloride 98 - 111 mmol/L 101   CO2 22 - 32 mmol/L 28   Calcium 8.9 - 10.3 mg/dL 9.5   Total Protein 6.5 - 8.1 g/dL 8.0   Total Bilirubin 0.3 - 1.2 mg/dL 0.4   Alkaline Phos 38 - 126 U/L 77   AST 15 - 41 U/L 15   ALT 0 - 44 U/L 8       Latest Ref Rng & Units 02/05/2023    2:37 PM  CBC  WBC 4.0 - 10.5 K/uL 4.8   Hemoglobin 12.0 - 15.0 g/dL 12.7   Hematocrit 36.0 - 46.0 % 40.9   Platelets 150 - 400 K/uL 258    Iron/TIBC/Ferritin/ %Sat    Component Value Date/Time   IRON 74 02/05/2023 1437   TIBC 382 02/05/2023 1437   FERRITIN 79 02/05/2023 1437   IRONPCTSAT 19 02/05/2023 1437     Assessment and plan- Patient is a 37 y.o. female with  History of small bilateral PE- October 2019. Thought to be secondary to OCPs and obesity. Hypercoagulable workup was negative. She has been on xarelto since 2019. Previously discussed stopping anticoagulation if her d dimer levels were less than 250. She was hesitant to stop d/t ongoing bilateral lower extremity edema which I explained may be chronic, and that she remains obese. D Dimer has normalized. Today we discussed her individual risk including ongoing obesity, sedentary lifestyle, use of hormonal treatments. She wishes to continue xarelto to minimize these risks though recommend reevaluating routinely. Continue xarelto.   B12 Deficiency- last b12 was 450 in 01/05/22. Today's level pending. She receives b12 injections monthly at home.  Continue. Refilled.  Iron Deficiency Anemia- Hemoglobin is normal. Hmg 12.7. Ferritin and iron sats were pending at time of visit but have resulted. Ferritin 79, iron sat 19%. Improved. No role for IV iron.     Disposition:  No iron 6 mo- labs (cbc, ferritin, iron studies, b12) Few days to week later see Dr Janese Banks virtually for f/u- la    Visit Diagnosis 1. Iron deficiency anemia due  to chronic blood loss   2. B12 deficiency   3. Anticoagulation management encounter    Beckey Rutter, [place], AGNP-C [place] [facility] [phone] (clinic) 02/05/2023

## 2023-02-16 ENCOUNTER — Other Ambulatory Visit: Payer: Self-pay | Admitting: Dermatology

## 2023-02-16 DIAGNOSIS — L209 Atopic dermatitis, unspecified: Secondary | ICD-10-CM

## 2023-02-26 ENCOUNTER — Encounter: Payer: Self-pay | Admitting: Neurology

## 2023-03-08 ENCOUNTER — Other Ambulatory Visit: Payer: Self-pay | Admitting: Dermatology

## 2023-03-08 DIAGNOSIS — L209 Atopic dermatitis, unspecified: Secondary | ICD-10-CM

## 2023-04-12 ENCOUNTER — Ambulatory Visit
Admission: EM | Admit: 2023-04-12 | Discharge: 2023-04-12 | Disposition: A | Discharge status: Home / Self Care | Payer: Medicare HMO | Attending: Emergency Medicine | Admitting: Emergency Medicine

## 2023-04-12 ENCOUNTER — Ambulatory Visit (INDEPENDENT_AMBULATORY_CARE_PROVIDER_SITE_OTHER): Payer: Medicare HMO

## 2023-04-12 DIAGNOSIS — S39012A Strain of muscle, fascia and tendon of lower back, initial encounter: Secondary | ICD-10-CM | POA: Diagnosis not present

## 2023-04-12 DIAGNOSIS — S29019A Strain of muscle and tendon of unspecified wall of thorax, initial encounter: Secondary | ICD-10-CM

## 2023-04-12 DIAGNOSIS — S161XXA Strain of muscle, fascia and tendon at neck level, initial encounter: Secondary | ICD-10-CM | POA: Diagnosis not present

## 2023-04-12 MED ORDER — DICLOFENAC SODIUM 1 % EX GEL: 2.0000 g | Freq: Four times a day (QID) | CUTANEOUS | 0 refills | Status: DC

## 2023-04-12 MED ORDER — BACLOFEN 10 MG PO TABS: 10.0000 mg | ORAL_TABLET | Freq: Three times a day (TID) | ORAL | 0 refills | Status: DC
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
## 2023-05-01 ENCOUNTER — Encounter: Payer: Self-pay | Admitting: Oncology

## 2023-05-08 ENCOUNTER — Other Ambulatory Visit: Payer: Medicare HMO

## 2023-05-11 ENCOUNTER — Telehealth: Payer: Medicare HMO | Admitting: Oncology

## 2023-05-11 ENCOUNTER — Ambulatory Visit: Payer: Medicare HMO | Admitting: Oncology

## 2023-05-17 ENCOUNTER — Other Ambulatory Visit: Payer: Self-pay | Admitting: Dermatology

## 2023-05-17 DIAGNOSIS — L304 Erythema intertrigo: Secondary | ICD-10-CM

## 2023-05-17 DIAGNOSIS — L700 Acne vulgaris: Secondary | ICD-10-CM

## 2023-05-17 DIAGNOSIS — R21 Rash and other nonspecific skin eruption: Secondary | ICD-10-CM

## 2023-05-17 DIAGNOSIS — L209 Atopic dermatitis, unspecified: Secondary | ICD-10-CM

## 2023-05-23 ENCOUNTER — Other Ambulatory Visit: Payer: Self-pay | Admitting: Dermatology

## 2023-05-23 DIAGNOSIS — L209 Atopic dermatitis, unspecified: Secondary | ICD-10-CM

## 2023-06-10 ENCOUNTER — Encounter: Payer: Self-pay | Admitting: Oncology

## 2023-06-11 ENCOUNTER — Ambulatory Visit: Payer: Medicare HMO | Admitting: Internal Medicine

## 2023-06-11 NOTE — Progress Notes (Unsigned)
Pt did not show for scheduled appointment.  

## 2023-06-18 ENCOUNTER — Other Ambulatory Visit: Payer: Self-pay | Admitting: Dermatology

## 2023-06-18 DIAGNOSIS — L209 Atopic dermatitis, unspecified: Secondary | ICD-10-CM

## 2023-06-18 DIAGNOSIS — R21 Rash and other nonspecific skin eruption: Secondary | ICD-10-CM

## 2023-06-18 DIAGNOSIS — L304 Erythema intertrigo: Secondary | ICD-10-CM

## 2023-06-18 DIAGNOSIS — L700 Acne vulgaris: Secondary | ICD-10-CM

## 2023-06-27 NOTE — Progress Notes (Deleted)
NEUROLOGY CONSULTATION NOTE  Andrea Miller MRN: [id number] DOB: 1986/07/29  Referring provider: Karie Fetch, MD Primary care provider: Karie Fetch, MD  Reason for consult:  headaches, seizures  Assessment/Plan:   ***   Subjective:  Andrea Miller is a 37 year old female with CHF, SVT, HTN, angina, asthma, Bipolar disorder, fibromyalgia, depression and anxiety and history of PE who presents for headaches and psychogenic nonepileptic seizures who presents for headaches and seizures.  History supplemented by ***     Headaches since ***.  Headaches are worse in the morning.  Feels like she is "not getting enough oxygen" when she wakes up.  She has diagnosed OSA but not using CPAP.    She reports concerns that she is having seizures.  ***.  She already carries a diagnosis of psychogenic nonepileptic seizures which was definitively diagnosed at the [facility] in June 2015 (semiology - briefly less responsive but able to follow commands and answer questions and with brief head shaking)..   MRI of brain without contrast on 05/16/2020 personally reviewed showed partially empty sella but otherwise negative.     Past NSAIDS/analgesics:  *** Past abortive triptans:  *** Past abortive ergotamine:  *** Past muscle relaxants:  tizanidine Past anti-emetic:  *** Past antihypertensive medications:  propranolol (asthma) Past antidepressant medications:  venlafaxine (side effect), nortriptyline (side effect) Past anticonvulsant medications:  topiramate (side effect), gabapentin (side effect), lamotrigine (side effect) Past anti-CGRP:  *** Past vitamins/Herbal/Supplements:  *** Past antihistamines/decongestants:  meclizine Other:  prednisone (severe reaction)  Current NSAIDS/analgesics:  Buprenorphine patch Current triptans:  none Current ergotamine:  none Current anti-emetic:  Zofran ODT 4mg  Current muscle relaxants:  none Current Antihypertensive medications:  diltiazem Current  Antidepressant medications:  none Current Anticonvulsant medications:  none Current anti-CGRP:  none Other therapy:  *** Birth control:  none Other medications:  dextroamphetamine-amphetamine, lorazeapm   Caffeine:  *** Alcohol:  *** Smoker:  *** Diet:  *** Exercise:  *** Depression:  ***; Anxiety:  *** Other pain:  *** Sleep hygiene:  *** Family history of headache:  ***   PAST MEDICAL HISTORY: Past Medical History:  Diagnosis Date   Anxiety    Asthma    Bipolar disorder (HCC)    Chest pain    and angina   CHF (congestive heart failure) (HCC)    Depression    Fibromyalgia    Hypertension    Left lumbar radiculopathy since 08/2014   secondary to work accident   Obesity    Pre-diabetes    Pulmonary embolus (HCC)    Seizures (HCC)    secondary to anxiety  last seizure 01/25/18   SVT (supraventricular tachycardia)    with hx of syncope; managed by [facility]    PAST SURGICAL HISTORY: Past Surgical History:  Procedure Laterality Date   CHOLECYSTECTOMY N/A 02/22/2018   Procedure: LAPAROSCOPIC CHOLECYSTECTOMY;  Surgeon: Ancil Linsey, MD;  Location: [facility] ORS;  Service: General;  Laterality: N/A;   TONSILLECTOMY AND ADENOIDECTOMY N/A 09/05/2016   Procedure: TONSILLECTOMY AND ADENOIDECTOMY;  Surgeon: Linus Salmons, MD;  Location: [facility] ORS;  Service: ENT;  Laterality: N/A;    MEDICATIONS: Current Outpatient Medications on File Prior to Visit  Medication Sig Dispense Refill   Adapalene 0.3 % gel APPLY 1 APPLICATION. TOPICALLY AT BEDTIME. 45 g 0   albuterol (PROVENTIL) (2.5 MG/3ML) 0.083% nebulizer solution Take 3 mLs (2.5 mg total) by nebulization every 4 (four) hours as needed for wheezing or shortness of breath. 360 mL  0   Albuterol Sulfate (PROAIR RESPICLICK) 108 (90 Base) MCG/ACT AEPB Inhale 2 puffs into the lungs every 6 (six) hours as needed. 1 each 0   amphetamine-dextroamphetamine (ADDERALL XR) 15 MG 24 hr capsule Patient says she is taking 25 mg daily now      ARIPiprazole (ABILIFY) 20 MG tablet Take 1 tablet by mouth daily.     baclofen (LIORESAL) 10 MG tablet Take 1 tablet (10 mg total) by mouth 3 (three) times daily. 30 each 0   benzonatate (TESSALON) 100 MG capsule Take 100 mg by mouth 3 (three) times daily as needed.     budesonide-formoterol (SYMBICORT) 160-4.5 MCG/ACT inhaler Inhale 2 puffs into the lungs in the morning and at bedtime. 1 each 6   buprenorphine (BUTRANS) 15 MCG/HR Place 1 patch onto the skin once a week.     busPIRone (BUSPAR) 15 MG tablet      Calcium-Magnesium 500-250 MG TABS Take 1 tablet by mouth 2 (two) times daily.      cetirizine (ZYRTEC) 10 MG tablet Take 10 mg by mouth daily as needed.     cyanocobalamin (VITAMIN B12) 1000 MCG/ML injection Inject 1 mL (1,000 mcg total) into the muscle every 30 (thirty) days for 12 doses. 3 mL 3   Dexlansoprazole 30 MG capsule Take 30 mg by mouth daily.     diazepam (VALIUM) 10 MG tablet Take 10 mg by mouth daily as needed.     diclofenac Sodium (VOLTAREN) 1 % GEL Apply 2 g topically 4 (four) times daily. 100 g 0   diltiazem (CARDIZEM CD) 360 MG 24 hr capsule Take 1 capsule (360 mg total) by mouth every morning. 90 capsule 3   escitalopram (LEXAPRO) 20 MG tablet Take 20 mg by mouth daily.     fluticasone (FLONASE) 50 MCG/ACT nasal spray Place 2 sprays into both nostrils daily as needed for allergies. 16 g 1   folic acid (FOLVITE) 1 MG tablet Take 1 mg by mouth daily.     furosemide (LASIX) 40 MG tablet Take 1 tablet (40 mg total) by mouth daily. 90 tablet 3   gabapentin (NEURONTIN) 800 MG tablet Take 800 mg by mouth 3 (three) times daily.     GUAIASORB DM 100-10 MG/5ML liquid Take 5 mLs by mouth 4 (four) times daily as needed.     hydrocortisone 2.5 % cream APPLY TO ECZEMA ON FACE 1-2 TIMES A DAY AS NEEDED UNTIL RASH IMPROVED. 28 g 0   hydroxypropyl methylcellulose / hypromellose (ISOPTO TEARS / GONIOVISC) 2.5 % ophthalmic solution Place 1 drop into both eyes 3 (three) times daily as  needed for dry eyes.     ipratropium (ATROVENT) 0.06 % nasal spray Place 2 sprays into both nostrils 4 (four) times daily. 15 mL 0   isosorbide mononitrate (IMDUR) 30 MG 24 hr tablet Take 30 mg by mouth daily.     ketoconazole (NIZORAL) 2 % cream APPLY 1-2 TIMES A DAY TO ITCHY RASH FACE, SCALP, UNDER BREAST UNTIL CLEAR, THEN AS NEEDED FOR FLARES 60 g 0   ketoconazole (NIZORAL) 2 % shampoo WASH SCALP 1-2 TIMES A WEEK, LET SIT 5 MINUTES AND RINSE OUT 120 mL 3   [employer] PEN NEEDLES 31G X 6 MM MISC SMARTSIG:pre-filled pen syringe injection Daily     lactulose (CHRONULAC) 10 GM/15ML solution Take 30 g by mouth daily as needed for mild constipation.  5   lamoTRIgine (LAMICTAL) 200 MG tablet Take 1 tablet by mouth 2 (two) times a day.  lidocaine (XYLOCAINE) 5 % ointment Apply 1 Application topically. 2-3 times per week     linaclotide (LINZESS) 290 MCG CAPS capsule Take 290 mcg by mouth daily before breakfast.     LORazepam (ATIVAN) 1 MG tablet Take 1 tablet by mouth 2 (two) times daily.     Lurasidone HCl (LATUDA) 60 MG TABS Take 1 tablet by mouth in the morning.     meclizine (ANTIVERT) 25 MG tablet      medroxyPROGESTERone (PROVERA) 5 MG tablet Take 5 mg by mouth daily.     mometasone (ELOCON) 0.1 % cream APPLY TO MORE SEVERE AREAS ECZEMA 1-2 TIMES AS DIRECTED. MAY USE ON FACE UP TO 1 WEEK AT A TIME. AVOID GROIN, UNDERARMS. 45 g 3   montelukast (SINGULAIR) 10 MG tablet Take 1 tablet (10 mg total) by mouth at bedtime. 90 tablet 1   nortriptyline (PAMELOR) 25 MG capsule      nystatin (MYCOSTATIN) 100000 UNIT/ML suspension      olopatadine (PATANOL) 0.1 % ophthalmic solution 1 drop 2 (two) times daily.     ondansetron (ZOFRAN) 8 MG tablet Take 8 mg by mouth 3 (three) times daily.     pimecrolimus (ELIDEL) 1 % cream APPLY TOPICALLY TO AREAS ON FACE, BODY, BODY FOLDS. 100 g 0   potassium chloride (KLOR-CON M) 10 MEQ tablet Take 10 mEq by mouth 2 (two) times daily.     Potassium Chloride ER 20 MEQ TBCR  Take 20 mEq by mouth 2 (two) times daily. 180 tablet 3   prazosin (MINIPRESS) 2 MG capsule Take 4 mg by mouth at bedtime.     propranolol (INDERAL) 80 MG tablet Take 1 tablet (80 mg total) by mouth 3 (three) times daily. 270 tablet 3   rivaroxaban (XARELTO) 20 MG TABS tablet Take 1 tablet (20 mg total) by mouth daily with supper. 90 tablet 1   rosuvastatin (CRESTOR) 20 MG tablet Take 20 mg by mouth daily.     SPIRIVA RESPIMAT 1.25 MCG/ACT AERS INHALE 2 PUFFS INTO THE LUNGS DAILY. 4 g 0   SYRINGE-NEEDLE, DISP, 3 ML (BD SAFETYGLIDE SYRINGE/NEEDLE) 25G X 1" 3 ML MISC Use the needle for M injection once a month. 6 each 1   Topiramate ER (TROKENDI XR) 200 MG CP24 Take 1 capsule by mouth daily.      tretinoin (RETIN-A) 0.025 % cream FOR ACNE APPLY TO FACE NIGHTLY AS TOLERATED. 45 g 5   venlafaxine XR (EFFEXOR-XR) 150 MG 24 hr capsule Take 150 mg by mouth daily.     VICTOZA 18 MG/3ML SOPN Inject 1.2 mg into the skin daily.     WEGOVY 2.4 MG/0.75ML SOAJ SMARTSIG:2.4 Milligram(s) SUB-Q Once a Week     [DISCONTINUED] dicyclomine (BENTYL) 10 MG capsule Take 1 capsule (10 mg total) by mouth every 6 (six) hours as needed for up to 7 days for spasms (abdominal pain). 20 capsule 0   Current Facility-Administered Medications on File Prior to Visit  Medication Dose Route Frequency Provider Last Rate Last Admin   iron sucrose (VENOFER) injection 200 mg  200 mg Intravenous Once Creig Hines, MD        ALLERGIES: Allergies  Allergen Reactions   Garlic Anaphylaxis   Prednisone Swelling    Tongue swelling   Contrast Media [Iodinated Contrast Media]     Pt reports that "every time" she receives contrast she "can't breathe" for a short time. At time of arrival to room after CT scan, she is breathing normally and  VS WNL.  Pt states that she thought not breathing after contrast for a little while was normal. When asked if allergic or has any reaction to IV contrast patient stated no. LP    Amoxicillin Rash    Corticosteroids Palpitations   Penicillins Other (See Comments) and Rash    Has patient had a PCN reaction causing immediate rash, facial/tongue/throat swelling, SOB or lightheadedness with hypotension:Yes  Has patient had a PCN reaction causing severe rash involving mucus membranes or skin necrosis:No  Has patient had a PCN reaction that required hospitalization:No  Has patient had a PCN reaction occurring within the last 10 years:Yes.  If all of the above answers are "NO", then may proceed with Cephalosporin use.  Patient does not remember  Has patient had a PCN reaction causing immediate rash, facial/tongue/throat swelling, SOB or lightheadedness with hypotension:Yes, Has patient had a PCN reaction causing severe rash involving mucus membranes or skin necrosis:No, Has patient had a PCN reaction that required hospitalization:No, Has patient had a PCN reaction occurring within the last 10 years:Yes., If all of the above answers are "NO", then may proceed with Cephalosporin use., , , Patient does not remember, Patient does not remember    FAMILY HISTORY: Family History  Problem Relation Age of Onset   Diabetes Mother    Hypertension Mother    Asthma Mother    Gallstones Mother    Diabetes Father    Hypertension Father    Gallstones Father    Bipolar disorder Sister    COPD Brother    Schizophrenia Brother    COPD Brother    Hypertension Brother     Objective:  *** General: No acute distress.  Patient appears well-groomed.   Head:  Normocephalic/atraumatic Eyes:  fundi examined but not visualized Neck: supple, no paraspinal tenderness, full range of motion Back: No paraspinal tenderness Heart: regular rate and rhythm Lungs: Clear to auscultation bilaterally. Vascular: No carotid bruits. Neurological Exam: Mental status: alert and oriented to person, place, and time, speech fluent and not dysarthric, language intact. Cranial nerves: CN I: not tested CN II: pupils equal,  round and reactive to light, visual fields intact CN III, IV, VI:  full range of motion, no nystagmus, no ptosis CN V: facial sensation intact. CN VII: upper and lower face symmetric CN VIII: hearing intact CN IX, X: gag intact, uvula midline CN XI: sternocleidomastoid and trapezius muscles intact CN XII: tongue midline Bulk & Tone: normal, no fasciculations. Motor:  muscle strength 5/5 throughout Sensation:  Pinprick, temperature and vibratory sensation intact. Deep Tendon Reflexes:  2+ throughout,  toes downgoing.   Finger to nose testing:  Without dysmetria.   Heel to shin:  Without dysmetria.   Gait:  Normal station and stride.  Romberg negative.    Thank you for allowing me to take part in the care of this patient.  Shon Millet, DO  CC: ***

## 2023-06-28 ENCOUNTER — Ambulatory Visit: Payer: Medicare HMO | Admitting: Neurology

## 2023-06-28 ENCOUNTER — Encounter: Payer: Self-pay | Admitting: Neurology

## 2023-07-03 ENCOUNTER — Other Ambulatory Visit: Payer: Self-pay | Admitting: Dermatology

## 2023-07-03 DIAGNOSIS — L209 Atopic dermatitis, unspecified: Secondary | ICD-10-CM

## 2023-07-03 DIAGNOSIS — L304 Erythema intertrigo: Secondary | ICD-10-CM

## 2023-07-03 DIAGNOSIS — R21 Rash and other nonspecific skin eruption: Secondary | ICD-10-CM

## 2023-07-03 DIAGNOSIS — L700 Acne vulgaris: Secondary | ICD-10-CM

## 2023-07-23 ENCOUNTER — Other Ambulatory Visit: Payer: Self-pay | Admitting: Dermatology

## 2023-07-23 DIAGNOSIS — L209 Atopic dermatitis, unspecified: Secondary | ICD-10-CM

## 2023-07-23 DIAGNOSIS — L700 Acne vulgaris: Secondary | ICD-10-CM

## 2023-07-23 DIAGNOSIS — L304 Erythema intertrigo: Secondary | ICD-10-CM

## 2023-07-23 DIAGNOSIS — R21 Rash and other nonspecific skin eruption: Secondary | ICD-10-CM

## 2023-08-08 ENCOUNTER — Other Ambulatory Visit: Payer: Self-pay

## 2023-08-08 DIAGNOSIS — D500 Iron deficiency anemia secondary to blood loss (chronic): Secondary | ICD-10-CM

## 2023-08-08 DIAGNOSIS — E538 Deficiency of other specified B group vitamins: Secondary | ICD-10-CM

## 2023-08-09 ENCOUNTER — Inpatient Hospital Stay: Payer: Medicare HMO | Attending: Oncology

## 2023-08-09 DIAGNOSIS — Z79899 Other long term (current) drug therapy: Secondary | ICD-10-CM | POA: Insufficient documentation

## 2023-08-09 DIAGNOSIS — E538 Deficiency of other specified B group vitamins: Secondary | ICD-10-CM | POA: Insufficient documentation

## 2023-08-09 DIAGNOSIS — D509 Iron deficiency anemia, unspecified: Secondary | ICD-10-CM | POA: Insufficient documentation

## 2023-08-10 ENCOUNTER — Inpatient Hospital Stay: Payer: Medicare HMO | Admitting: Oncology

## 2023-08-17 ENCOUNTER — Other Ambulatory Visit: Payer: Self-pay | Admitting: Dermatology

## 2023-08-17 ENCOUNTER — Other Ambulatory Visit: Payer: Self-pay | Admitting: Pulmonary Disease

## 2023-08-17 ENCOUNTER — Inpatient Hospital Stay: Payer: Medicare HMO

## 2023-08-17 DIAGNOSIS — D500 Iron deficiency anemia secondary to blood loss (chronic): Secondary | ICD-10-CM

## 2023-08-17 DIAGNOSIS — E538 Deficiency of other specified B group vitamins: Secondary | ICD-10-CM

## 2023-08-17 DIAGNOSIS — L209 Atopic dermatitis, unspecified: Secondary | ICD-10-CM

## 2023-08-17 DIAGNOSIS — L700 Acne vulgaris: Secondary | ICD-10-CM

## 2023-08-17 DIAGNOSIS — Z79899 Other long term (current) drug therapy: Secondary | ICD-10-CM | POA: Diagnosis not present

## 2023-08-17 DIAGNOSIS — D509 Iron deficiency anemia, unspecified: Secondary | ICD-10-CM | POA: Diagnosis present

## 2023-08-17 DIAGNOSIS — R21 Rash and other nonspecific skin eruption: Secondary | ICD-10-CM

## 2023-08-17 DIAGNOSIS — L304 Erythema intertrigo: Secondary | ICD-10-CM

## 2023-08-17 LAB — IRON AND TIBC
Iron: 112 ug/dL (ref 28–170)
Saturation Ratios: 30 % (ref 10.4–31.8)
TIBC: 372 ug/dL (ref 250–450)
UIBC: 260 ug/dL

## 2023-08-17 LAB — CBC (CANCER CENTER ONLY)
HCT: 39.6 % (ref 36.0–46.0)
Hemoglobin: 12.5 g/dL (ref 12.0–15.0)
MCH: 26.5 pg (ref 26.0–34.0)
MCHC: 31.6 g/dL (ref 30.0–36.0)
MCV: 84.1 fL (ref 80.0–100.0)
Platelet Count: 253 10*3/uL (ref 150–400)
RBC: 4.71 MIL/uL (ref 3.87–5.11)
RDW: 14 % (ref 11.5–15.5)
WBC Count: 4 10*3/uL (ref 4.0–10.5)
nRBC: 0 % (ref 0.0–0.2)

## 2023-08-17 LAB — VITAMIN B12: Vitamin B-12: 352 pg/mL (ref 180–914)

## 2023-08-17 LAB — FERRITIN: Ferritin: 62 ng/mL (ref 11–307)

## 2023-08-20 ENCOUNTER — Other Ambulatory Visit: Payer: Self-pay

## 2023-08-20 ENCOUNTER — Inpatient Hospital Stay (HOSPITAL_BASED_OUTPATIENT_CLINIC_OR_DEPARTMENT_OTHER): Payer: Medicare HMO | Admitting: Oncology

## 2023-08-20 DIAGNOSIS — E538 Deficiency of other specified B group vitamins: Secondary | ICD-10-CM

## 2023-08-20 DIAGNOSIS — D509 Iron deficiency anemia, unspecified: Secondary | ICD-10-CM | POA: Diagnosis not present

## 2023-08-20 DIAGNOSIS — Z7901 Long term (current) use of anticoagulants: Secondary | ICD-10-CM | POA: Diagnosis not present

## 2023-08-20 DIAGNOSIS — Z86711 Personal history of pulmonary embolism: Secondary | ICD-10-CM

## 2023-08-20 NOTE — Progress Notes (Signed)
I connected with Andrea Miller on 08/20/23 at  3:00 PM EDT by video enabled telemedicine visit and verified that I am speaking with the correct person using two identifiers.   I discussed the limitations, risks, security and privacy concerns of performing an evaluation and management service by telemedicine and the availability of in-person appointments. I also discussed with the patient that there may be a patient responsible charge related to this service. The patient expressed understanding and agreed to proceed.  Other persons participating in the visit and their role in the encounter:  none  Patient's location:  home Provider's location:  work  Diagnosis- history of pulmonary embolism and iron deficiency and B12 deficiency anemia   Chief complaint/ Reason for visit-routine follow-up of anemia and for history of PE  Heme/Onc history: Patient is a 37 year old African-American female with a history of bilateral pulmonary emboli that was diagnosed in October 2019.  Lower extremity Doppler at that time did not reveal any evidence of DVT.Hypercoagulable work-up on 10/11/2018 revealed the following negative studies: factor V Leiden, prothrombin gene mutation, anti-cardiolipin antibodies, and beta-2 glycoprotein antibodies.  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  Protein C, protein S, and ATIII were normal on 01/15/2019.  Lupus anticoagulant testing was negative on 04/24/2019.  Obesity and birth control pills were considered to be putative risk factors.  Patient is currently on Xarelto for this   She also has a history of iron and B12 deficiency anemia.  Etiology of iron deficiency has been attributed to heavy menstrual bleeding.    Interval history-tolerating Xarelto well without any significant side effects.  Menstrual cycles can be heavy at times.  She is on progestin only pills for the same.    Review of Systems  Constitutional:  Negative for chills, fever, malaise/fatigue  and weight loss.  HENT:  Negative for congestion, ear discharge and nosebleeds.   Eyes:  Negative for blurred vision.  Respiratory:  Negative for cough, hemoptysis, sputum production, shortness of breath and wheezing.   Cardiovascular:  Negative for chest pain, palpitations, orthopnea and claudication.  Gastrointestinal:  Negative for abdominal pain, blood in stool, constipation, diarrhea, heartburn, melena, nausea and vomiting.  Genitourinary:  Negative for dysuria, flank pain, frequency, hematuria and urgency.  Musculoskeletal:  Negative for back pain, joint pain and myalgias.  Skin:  Negative for rash.  Neurological:  Negative for dizziness, tingling, focal weakness, seizures, weakness and headaches.  Endo/Heme/Allergies:  Does not bruise/bleed easily.  Psychiatric/Behavioral:  Negative for depression and suicidal ideas. The patient does not have insomnia.     Allergies  Allergen Reactions   Garlic Anaphylaxis   Prednisone Swelling    Tongue swelling   Contrast Media [Iodinated Contrast Media]     Pt reports that "every time" she receives contrast she "can't breathe" for a short time. At time of arrival to room after CT scan, she is breathing normally and VS WNL.  Pt states that she thought not breathing after contrast for a little while was normal. When asked if allergic or has any reaction to IV contrast patient stated no. LP    Amoxicillin Rash   Corticosteroids Palpitations   Penicillins Other (See Comments) and Rash    Has patient had a PCN reaction causing immediate rash, facial/tongue/throat swelling, SOB or lightheadedness with hypotension:Yes  Has patient had a PCN reaction causing severe rash involving mucus membranes or skin necrosis:No  Has patient had a PCN reaction that required hospitalization:No  Has patient had a PCN reaction occurring  within the last 10 years:Yes.  If all of the above answers are "NO", then may proceed with Cephalosporin use.  Patient does not  remember  Has patient had a PCN reaction causing immediate rash, facial/tongue/throat swelling, SOB or lightheadedness with hypotension:Yes, Has patient had a PCN reaction causing severe rash involving mucus membranes or skin necrosis:No, Has patient had a PCN reaction that required hospitalization:No, Has patient had a PCN reaction occurring within the last 10 years:Yes., If all of the above answers are "NO", then may proceed with Cephalosporin use., , , Patient does not remember, Patient does not remember    Past Medical History:  Diagnosis Date   Anxiety    Asthma    Bipolar disorder (HCC)    Chest pain    and angina   CHF (congestive heart failure) (HCC)    Depression    Fibromyalgia    Hypertension    Left lumbar radiculopathy since 08/2014   secondary to work accident   Obesity    Pre-diabetes    Pulmonary embolus (HCC)    Seizures (HCC)    secondary to anxiety  last seizure 01/25/18   SVT (supraventricular tachycardia)    with hx of syncope; managed by [facility]    Past Surgical History:  Procedure Laterality Date   CHOLECYSTECTOMY N/A 02/22/2018   Procedure: LAPAROSCOPIC CHOLECYSTECTOMY;  Surgeon: Ancil Linsey, MD;  Location: [facility] ORS;  Service: General;  Laterality: N/A;   TONSILLECTOMY AND ADENOIDECTOMY N/A 09/05/2016   Procedure: TONSILLECTOMY AND ADENOIDECTOMY;  Surgeon: Linus Salmons, MD;  Location: [facility] ORS;  Service: ENT;  Laterality: N/A;    Social History   Socioeconomic History   Marital status: Divorced    Spouse name: Not on file   Number of children: Not on file   Years of education: Not on file   Highest education level: Not on file  Occupational History   Not on file  Tobacco Use   Smoking status: Never   Smokeless tobacco: Never  Vaping Use   Vaping status: Never Used  Substance and Sexual Activity   Alcohol use: Not Currently   Drug use: No   Sexual activity: Yes    Birth control/protection: None  Other Topics Concern   Not on file   Social History Narrative   Not on file   Social Determinants of Health   Financial Resource Strain: Low Risk  (10/27/2020)   Received from [facility], [facility]   Overall Financial Resource Strain (CARDIA)    Difficulty of Paying Living Expenses: Not hard at all  Food Insecurity: No Food Insecurity (10/27/2020)   Received from [facility], [facility]   Hunger Vital Sign    Worried About Running Out of Food in the Last Year: Never true    Ran Out of Food in the Last Year: Never true  Transportation Needs: No Transportation Needs (10/27/2020)   Received from [facility], [facility]   [facility] - Transportation    Lack of Transportation (Medical): No    Lack of Transportation (Non-Medical): No  Physical Activity: Not on file  Stress: Not on file  Social Connections: Not on file  Intimate Partner Violence: Not on file    Family History  Problem Relation Age of Onset   Diabetes Mother    Hypertension Mother    Asthma Mother    Gallstones Mother    Diabetes Father    Hypertension Father    Gallstones Father  Bipolar disorder Sister    COPD Brother    Schizophrenia Brother    COPD Brother    Hypertension Brother      Current Outpatient Medications:    Adapalene 0.3 % gel, APPLY 1 APPLICATION. TOPICALLY AT BEDTIME., Disp: 45 g, Rfl: 0   albuterol (PROVENTIL) (2.5 MG/3ML) 0.083% nebulizer solution, Take 3 mLs (2.5 mg total) by nebulization every 4 (four) hours as needed for wheezing or shortness of breath., Disp: 360 mL, Rfl: 0   Albuterol Sulfate (PROAIR RESPICLICK) 108 (90 Base) MCG/ACT AEPB, Inhale 2 puffs into the lungs every 6 (six) hours as needed., Disp: 1 each, Rfl: 0   amphetamine-dextroamphetamine (ADDERALL XR) 15 MG 24 hr capsule, Patient says she is taking 25 mg daily now, Disp: , Rfl:    ARIPiprazole (ABILIFY) 20 MG tablet, Take 1 tablet by mouth daily., Disp: , Rfl:    baclofen (LIORESAL) 10 MG tablet, Take 1 tablet (10 mg total) by  mouth 3 (three) times daily., Disp: 30 each, Rfl: 0   benzonatate (TESSALON) 100 MG capsule, Take 100 mg by mouth 3 (three) times daily as needed., Disp: , Rfl:    budesonide-formoterol (SYMBICORT) 160-4.5 MCG/ACT inhaler, Inhale 2 puffs into the lungs in the morning and at bedtime., Disp: 1 each, Rfl: 6   buprenorphine (BUTRANS) 15 MCG/HR, Place 1 patch onto the skin once a week., Disp: , Rfl:    busPIRone (BUSPAR) 15 MG tablet, , Disp: , Rfl:    Calcium-Magnesium 500-250 MG TABS, Take 1 tablet by mouth 2 (two) times daily. , Disp: , Rfl:    cetirizine (ZYRTEC) 10 MG tablet, Take 10 mg by mouth daily as needed., Disp: , Rfl:    cyanocobalamin (VITAMIN B12) 1000 MCG/ML injection, Inject 1 mL (1,000 mcg total) into the muscle every 30 (thirty) days for 12 doses., Disp: 3 mL, Rfl: 3   Dexlansoprazole 30 MG capsule, Take 30 mg by mouth daily., Disp: , Rfl:    diazepam (VALIUM) 10 MG tablet, Take 10 mg by mouth daily as needed., Disp: , Rfl:    diclofenac Sodium (VOLTAREN) 1 % GEL, Apply 2 g topically 4 (four) times daily., Disp: 100 g, Rfl: 0   diltiazem (CARDIZEM CD) 360 MG 24 hr capsule, Take 1 capsule (360 mg total) by mouth every morning., Disp: 90 capsule, Rfl: 3   escitalopram (LEXAPRO) 20 MG tablet, Take 20 mg by mouth daily., Disp: , Rfl:    fluticasone (FLONASE) 50 MCG/ACT nasal spray, Place 2 sprays into both nostrils daily as needed for allergies., Disp: 16 g, Rfl: 1   folic acid (FOLVITE) 1 MG tablet, Take 1 mg by mouth daily., Disp: , Rfl:    furosemide (LASIX) 40 MG tablet, Take 1 tablet (40 mg total) by mouth daily., Disp: 90 tablet, Rfl: 3   gabapentin (NEURONTIN) 800 MG tablet, Take 800 mg by mouth 3 (three) times daily., Disp: , Rfl:    hydrocortisone 2.5 % cream, APPLY TO ECZEMA ON FACE 1-2 TIMES A DAY AS NEEDED UNTIL RASH IMPROVED., Disp: 28 g, Rfl: 0   hydroxypropyl methylcellulose / hypromellose (ISOPTO TEARS / GONIOVISC) 2.5 % ophthalmic solution, Place 1 drop into both eyes 3  (three) times daily as needed for dry eyes., Disp: , Rfl:    ipratropium (ATROVENT) 0.06 % nasal spray, Place 2 sprays into both nostrils 4 (four) times daily., Disp: 15 mL, Rfl: 0   ketoconazole (NIZORAL) 2 % cream, APPLY 1-2 TIMES A DAY TO ITCHY RASH FACE,  SCALP, UNDER BREAST UNTIL CLEAR, THEN AS NEEDED FOR FLARES, Disp: 60 g, Rfl: 0   ketoconazole (NIZORAL) 2 % shampoo, WASH SCALP 1-2 TIMES A WEEK, LET SIT 5 MINUTES AND RINSE OUT, Disp: 120 mL, Rfl: 3   [employer] PEN NEEDLES 31G X 6 MM MISC, SMARTSIG:pre-filled pen syringe injection Daily, Disp: , Rfl:    lamoTRIgine (LAMICTAL) 200 MG tablet, Take 1 tablet by mouth 2 (two) times a day., Disp: , Rfl:    lidocaine (XYLOCAINE) 5 % ointment, Apply 1 Application topically. 2-3 times per week, Disp: , Rfl:    linaclotide (LINZESS) 290 MCG CAPS capsule, Take 290 mcg by mouth daily before breakfast., Disp: , Rfl:    LORazepam (ATIVAN) 1 MG tablet, Take 1 tablet by mouth 2 (two) times daily., Disp: , Rfl:    Lurasidone HCl (LATUDA) 60 MG TABS, Take 1 tablet by mouth in the morning., Disp: , Rfl:    medroxyPROGESTERone (PROVERA) 5 MG tablet, Take 5 mg by mouth daily., Disp: , Rfl:    mometasone (ELOCON) 0.1 % cream, APPLY TO MORE SEVERE AREAS ECZEMA 1-2 TIMES AS DIRECTED. MAY USE ON FACE UP TO 1 WEEK AT A TIME. AVOID GROIN, UNDERARMS., Disp: 45 g, Rfl: 3   montelukast (SINGULAIR) 10 MG tablet, Take 1 tablet (10 mg total) by mouth at bedtime., Disp: 90 tablet, Rfl: 1   nortriptyline (PAMELOR) 25 MG capsule, , Disp: , Rfl:    nystatin (MYCOSTATIN) 100000 UNIT/ML suspension, , Disp: , Rfl:    pimecrolimus (ELIDEL) 1 % cream, APPLY TOPICALLY TO AREAS ON FACE, BODY, BODY FOLDS., Disp: 100 g, Rfl: 0   Potassium Chloride ER 20 MEQ TBCR, Take 20 mEq by mouth 2 (two) times daily., Disp: 180 tablet, Rfl: 3   propranolol (INDERAL) 80 MG tablet, Take 1 tablet (80 mg total) by mouth 3 (three) times daily., Disp: 270 tablet, Rfl: 3   rivaroxaban (XARELTO) 20 MG TABS  tablet, Take 1 tablet (20 mg total) by mouth daily with supper., Disp: 90 tablet, Rfl: 1   rosuvastatin (CRESTOR) 20 MG tablet, Take 20 mg by mouth daily., Disp: , Rfl:    SPIRIVA RESPIMAT 1.25 MCG/ACT AERS, INHALE 2 PUFFS INTO THE LUNGS DAILY., Disp: 4 g, Rfl: 0   SYRINGE-NEEDLE, DISP, 3 ML (BD SAFETYGLIDE SYRINGE/NEEDLE) 25G X 1" 3 ML MISC, Use the needle for M injection once a month., Disp: 6 each, Rfl: 1   Topiramate ER (TROKENDI XR) 200 MG CP24, Take 1 capsule by mouth daily. , Disp: , Rfl:    tretinoin (RETIN-A) 0.025 % cream, FOR ACNE APPLY TO FACE NIGHTLY AS TOLERATED., Disp: 45 g, Rfl: 5   venlafaxine XR (EFFEXOR-XR) 150 MG 24 hr capsule, Take 150 mg by mouth daily., Disp: , Rfl:    GUAIASORB DM 100-10 MG/5ML liquid, Take 5 mLs by mouth 4 (four) times daily as needed. (Patient not taking: Reported on 08/20/2023), Disp: , Rfl:    isosorbide mononitrate (IMDUR) 30 MG 24 hr tablet, Take 30 mg by mouth daily. (Patient not taking: Reported on 08/20/2023), Disp: , Rfl:    lactulose (CHRONULAC) 10 GM/15ML solution, Take 30 g by mouth daily as needed for mild constipation. (Patient not taking: Reported on 08/20/2023), Disp: , Rfl: 5   meclizine (ANTIVERT) 25 MG tablet, , Disp: , Rfl:    olopatadine (PATANOL) 0.1 % ophthalmic solution, 1 drop 2 (two) times daily. (Patient not taking: Reported on 08/20/2023), Disp: , Rfl:    ondansetron (ZOFRAN) 8 MG tablet,  Take 8 mg by mouth 3 (three) times daily. (Patient not taking: Reported on 08/20/2023), Disp: , Rfl:    potassium chloride (KLOR-CON M) 10 MEQ tablet, Take 10 mEq by mouth 2 (two) times daily. (Patient not taking: Reported on 08/20/2023), Disp: , Rfl:    prazosin (MINIPRESS) 2 MG capsule, Take 4 mg by mouth at bedtime., Disp: , Rfl:    VICTOZA 18 MG/3ML SOPN, Inject 1.2 mg into the skin daily. (Patient not taking: Reported on 08/20/2023), Disp: , Rfl:    WEGOVY 2.4 MG/0.75ML SOAJ, SMARTSIG:2.4 Milligram(s) SUB-Q Once a Week (Patient not taking: Reported  on 08/20/2023), Disp: , Rfl:  No current facility-administered medications for this visit.  Facility-Administered Medications Ordered in Other Visits:    iron sucrose (VENOFER) injection 200 mg, 200 mg, Intravenous, Once, Creig Hines, MD  No results found.  No images are attached to the encounter.      Latest Ref Rng & Units 02/05/2023    2:37 PM  CMP  Glucose 70 - 99 mg/dL 284   BUN 6 - 20 mg/dL 9   Creatinine 1.32 - 4.40 mg/dL 1.02   Sodium 725 - 366 mmol/L 136   Potassium 3.5 - 5.1 mmol/L 3.7   Chloride 98 - 111 mmol/L 101   CO2 22 - 32 mmol/L 28   Calcium 8.9 - 10.3 mg/dL 9.5   Total Protein 6.5 - 8.1 g/dL 8.0   Total Bilirubin 0.3 - 1.2 mg/dL 0.4   Alkaline Phos 38 - 126 U/L 77   AST 15 - 41 U/L 15   ALT 0 - 44 U/L 8       Latest Ref Rng & Units 08/17/2023   11:21 AM  CBC  WBC 4.0 - 10.5 K/uL 4.0   Hemoglobin 12.0 - 15.0 g/dL 44.0   Hematocrit 34.7 - 46.0 % 39.6   Platelets 150 - 400 K/uL 253      Observation/objective: Appears in no acute distress over video visit today.  Breathing is nonlabored  Assessment and plan: Patient is a 37 year old female who is here for follow-up of following issues:  History of iron deficiency: She has not received IV iron for over a year now.  She is not presently anemic and iron studies are currently pending History of B12 deficiency: We will send her prescriptions for injections and syringes.  She self administers monthly B12 injections History of bilateral PE in October 2019.  Patient will continue Xarelto at this time since she is still on progestin only pills and also has high BMI  Follow-up instructions: I will see her back in 1 year with labs  I discussed the assessment and treatment plan with the patient. The patient was provided an opportunity to ask questions and all were answered. The patient agreed with the plan and demonstrated an understanding of the instructions.   The patient was advised to call back or seek an  in-person evaluation if the symptoms worsen or if the condition fails to improve as anticipated.  I provided 12 minutes of face-to-face video visit time during this encounter, and > 50% was spent counseling as documented under my assessment & plan.  Visit Diagnosis: 1. Iron deficiency anemia, unspecified iron deficiency anemia type   2. Current use of long term anticoagulation   3. B12 deficiency   4. History of pulmonary embolism     Dr. Owens Shark, MD, MPH [facility] Tel- [phone] 08/20/2023 4:13 PM

## 2023-08-24 ENCOUNTER — Telehealth: Payer: Medicare HMO | Admitting: Nurse Practitioner

## 2023-08-24 DIAGNOSIS — R3989 Other symptoms and signs involving the genitourinary system: Secondary | ICD-10-CM

## 2023-08-24 MED ORDER — NITROFURANTOIN MONOHYD MACRO 100 MG PO CAPS
100.0000 mg | ORAL_CAPSULE | Freq: Two times a day (BID) | ORAL | 0 refills | Status: AC
Start: 2023-08-24 — End: 2023-08-29

## 2023-08-24 MED ORDER — PHENAZOPYRIDINE HCL 100 MG PO TABS
100.0000 mg | ORAL_TABLET | Freq: Three times a day (TID) | ORAL | 0 refills | Status: AC | PRN
Start: 2023-08-24 — End: 2023-08-26

## 2023-08-24 NOTE — Progress Notes (Signed)
Virtual Visit Consent   Andrea Miller, you are scheduled for a virtual visit with a [facility] provider today. Just as with appointments in the office, your consent must be obtained to participate. Your consent will be active for this visit and any virtual visit you may have with one of our providers in the next 365 days. If you have a MyChart account, a copy of this consent can be sent to you electronically.  As this is a virtual visit, video technology does not allow for your provider to perform a traditional examination. This may limit your provider's ability to fully assess your condition. If your provider identifies any concerns that need to be evaluated in person or the need to arrange testing (such as labs, EKG, etc.), we will make arrangements to do so. Although advances in technology are sophisticated, we cannot ensure that it will always work on either your end or our end. If the connection with a video visit is poor, the visit may have to be switched to a telephone visit. With either a video or telephone visit, we are not always able to ensure that we have a secure connection.  By engaging in this virtual visit, you consent to the provision of healthcare and authorize for your insurance to be billed (if applicable) for the services provided during this visit. Depending on your insurance coverage, you may receive a charge related to this service.  I need to obtain your verbal consent now. Are you willing to proceed with your visit today? Andrea Miller has provided verbal consent on 08/24/2023 for a virtual visit (video or telephone). Viviano Simas, FNP  Date: 08/24/2023 12:42 PM  Virtual Visit via Video Note   I, Viviano Simas, connected with  Andrea Miller  ([id number], 08/24/1986) on 08/24/23 at 12:45 PM EDT by a video-enabled telemedicine application and verified that I am speaking with the correct person using two identifiers.  Location: Patient: Virtual Visit Location Patient:  Home Provider: Virtual Visit Location Provider: Home Office   I discussed the limitations of evaluation and management by telemedicine and the availability of in person appointments. The patient expressed understanding and agreed to proceed.    History of Present Illness: Andrea Miller is a 37 y.o. who identifies as a female who was assigned female at birth, and is being seen today for urinary pain and inability to full urinate  Feels similar to UTIs she has had in the past  Denies a fever today   She just finished her menstraul cycle   She has been drinking cranberry juice for relief    Problems:  Patient Active Problem List   Diagnosis Date Noted   Cecal diverticulitis 10/27/2020   Generalized anxiety disorder with panic attacks 10/27/2020   Heart failure with preserved ejection fraction (HCC) 10/27/2020   History of pulmonary embolism 10/27/2020   Low back pain 12/31/2019   Chronic anticoagulation 05/07/2019   Iron deficiency anemia 10/15/2018   B12 deficiency 10/13/2018   Symptomatic anemia 10/11/2018   Multiple subsegmental pulmonary emboli without acute cor pulmonale (HCC) 09/15/2018   Morbid obesity with BMI of 50.0-59.9, adult (HCC) 09/15/2018   Chronic diastolic heart failure (HCC) 07/01/2018   Lymphedema 07/01/2018   Chronic cholecystitis    Edema 02/13/2018   Diabetes mellitus type 2, uncomplicated (HCC) 02/13/2018   Hypertension 11/23/2017   Asthma exacerbation 04/14/2017   Asthma without status asthmaticus 01/16/2017   Bipolar affective (HCC) 01/16/2017   Coronary artery disease 01/16/2017  Syncope 12/12/2016   Fibromyalgia 04/27/2014   OSA (obstructive sleep apnea) 04/27/2014   Seizure-like activity (HCC) 04/27/2014   Migraine without status migrainosus, not intractable 04/27/2014   Obesity 04/15/2014   Sleep disorder 04/15/2014   Neck pain 04/15/2014   Headache 04/15/2014   Restless legs syndrome 04/15/2014   Supraventricular tachycardia 07/13/2009     Allergies:  Allergies  Allergen Reactions   Garlic Anaphylaxis   Prednisone Swelling    Tongue swelling   Contrast Media [Iodinated Contrast Media]     Pt reports that "every time" she receives contrast she "can't breathe" for a short time. At time of arrival to room after CT scan, she is breathing normally and VS WNL.  Pt states that she thought not breathing after contrast for a little while was normal. When asked if allergic or has any reaction to IV contrast patient stated no. LP    Amoxicillin Rash   Corticosteroids Palpitations   Penicillins Other (See Comments) and Rash    Has patient had a PCN reaction causing immediate rash, facial/tongue/throat swelling, SOB or lightheadedness with hypotension:Yes  Has patient had a PCN reaction causing severe rash involving mucus membranes or skin necrosis:No  Has patient had a PCN reaction that required hospitalization:No  Has patient had a PCN reaction occurring within the last 10 years:Yes.  If all of the above answers are "NO", then may proceed with Cephalosporin use.  Patient does not remember  Has patient had a PCN reaction causing immediate rash, facial/tongue/throat swelling, SOB or lightheadedness with hypotension:Yes, Has patient had a PCN reaction causing severe rash involving mucus membranes or skin necrosis:No, Has patient had a PCN reaction that required hospitalization:No, Has patient had a PCN reaction occurring within the last 10 years:Yes., If all of the above answers are "NO", then may proceed with Cephalosporin use., , , Patient does not remember, Patient does not remember   Medications:  Current Outpatient Medications:    Adapalene 0.3 % gel, APPLY 1 APPLICATION. TOPICALLY AT BEDTIME., Disp: 45 g, Rfl: 0   albuterol (PROVENTIL) (2.5 MG/3ML) 0.083% nebulizer solution, Take 3 mLs (2.5 mg total) by nebulization every 4 (four) hours as needed for wheezing or shortness of breath., Disp: 360 mL, Rfl: 0   Albuterol Sulfate  (PROAIR RESPICLICK) 108 (90 Base) MCG/ACT AEPB, Inhale 2 puffs into the lungs every 6 (six) hours as needed., Disp: 1 each, Rfl: 0   amphetamine-dextroamphetamine (ADDERALL XR) 15 MG 24 hr capsule, Patient says she is taking 25 mg daily now, Disp: , Rfl:    ARIPiprazole (ABILIFY) 20 MG tablet, Take 1 tablet by mouth daily., Disp: , Rfl:    baclofen (LIORESAL) 10 MG tablet, Take 1 tablet (10 mg total) by mouth 3 (three) times daily., Disp: 30 each, Rfl: 0   benzonatate (TESSALON) 100 MG capsule, Take 100 mg by mouth 3 (three) times daily as needed., Disp: , Rfl:    budesonide-formoterol (SYMBICORT) 160-4.5 MCG/ACT inhaler, Inhale 2 puffs into the lungs in the morning and at bedtime., Disp: 1 each, Rfl: 6   buprenorphine (BUTRANS) 15 MCG/HR, Place 1 patch onto the skin once a week., Disp: , Rfl:    busPIRone (BUSPAR) 15 MG tablet, , Disp: , Rfl:    Calcium-Magnesium 500-250 MG TABS, Take 1 tablet by mouth 2 (two) times daily. , Disp: , Rfl:    cetirizine (ZYRTEC) 10 MG tablet, Take 10 mg by mouth daily as needed., Disp: , Rfl:    cyanocobalamin (VITAMIN B12) 1000 MCG/ML  injection, Inject 1 mL (1,000 mcg total) into the muscle every 30 (thirty) days for 12 doses., Disp: 3 mL, Rfl: 3   Dexlansoprazole 30 MG capsule, Take 30 mg by mouth daily., Disp: , Rfl:    diazepam (VALIUM) 10 MG tablet, Take 10 mg by mouth daily as needed., Disp: , Rfl:    diclofenac Sodium (VOLTAREN) 1 % GEL, Apply 2 g topically 4 (four) times daily., Disp: 100 g, Rfl: 0   diltiazem (CARDIZEM CD) 360 MG 24 hr capsule, Take 1 capsule (360 mg total) by mouth every morning., Disp: 90 capsule, Rfl: 3   escitalopram (LEXAPRO) 20 MG tablet, Take 20 mg by mouth daily., Disp: , Rfl:    fluticasone (FLONASE) 50 MCG/ACT nasal spray, Place 2 sprays into both nostrils daily as needed for allergies., Disp: 16 g, Rfl: 1   folic acid (FOLVITE) 1 MG tablet, Take 1 mg by mouth daily., Disp: , Rfl:    furosemide (LASIX) 40 MG tablet, Take 1 tablet  (40 mg total) by mouth daily., Disp: 90 tablet, Rfl: 3   gabapentin (NEURONTIN) 800 MG tablet, Take 800 mg by mouth 3 (three) times daily., Disp: , Rfl:    GUAIASORB DM 100-10 MG/5ML liquid, Take 5 mLs by mouth 4 (four) times daily as needed. (Patient not taking: Reported on 08/20/2023), Disp: , Rfl:    hydrocortisone 2.5 % cream, APPLY TO ECZEMA ON FACE 1-2 TIMES A DAY AS NEEDED UNTIL RASH IMPROVED., Disp: 28 g, Rfl: 0   hydroxypropyl methylcellulose / hypromellose (ISOPTO TEARS / GONIOVISC) 2.5 % ophthalmic solution, Place 1 drop into both eyes 3 (three) times daily as needed for dry eyes., Disp: , Rfl:    ipratropium (ATROVENT) 0.06 % nasal spray, Place 2 sprays into both nostrils 4 (four) times daily., Disp: 15 mL, Rfl: 0   isosorbide mononitrate (IMDUR) 30 MG 24 hr tablet, Take 30 mg by mouth daily. (Patient not taking: Reported on 08/20/2023), Disp: , Rfl:    ketoconazole (NIZORAL) 2 % cream, APPLY 1-2 TIMES A DAY TO ITCHY RASH FACE, SCALP, UNDER BREAST UNTIL CLEAR, THEN AS NEEDED FOR FLARES, Disp: 60 g, Rfl: 0   ketoconazole (NIZORAL) 2 % shampoo, WASH SCALP 1-2 TIMES A WEEK, LET SIT 5 MINUTES AND RINSE OUT, Disp: 120 mL, Rfl: 3   [employer] PEN NEEDLES 31G X 6 MM MISC, SMARTSIG:pre-filled pen syringe injection Daily, Disp: , Rfl:    lactulose (CHRONULAC) 10 GM/15ML solution, Take 30 g by mouth daily as needed for mild constipation. (Patient not taking: Reported on 08/20/2023), Disp: , Rfl: 5   lamoTRIgine (LAMICTAL) 200 MG tablet, Take 1 tablet by mouth 2 (two) times a day., Disp: , Rfl:    lidocaine (XYLOCAINE) 5 % ointment, Apply 1 Application topically. 2-3 times per week, Disp: , Rfl:    linaclotide (LINZESS) 290 MCG CAPS capsule, Take 290 mcg by mouth daily before breakfast., Disp: , Rfl:    LORazepam (ATIVAN) 1 MG tablet, Take 1 tablet by mouth 2 (two) times daily., Disp: , Rfl:    Lurasidone HCl (LATUDA) 60 MG TABS, Take 1 tablet by mouth in the morning., Disp: , Rfl:    meclizine (ANTIVERT)  25 MG tablet, , Disp: , Rfl:    medroxyPROGESTERone (PROVERA) 5 MG tablet, Take 5 mg by mouth daily., Disp: , Rfl:    mometasone (ELOCON) 0.1 % cream, APPLY TO MORE SEVERE AREAS ECZEMA 1-2 TIMES AS DIRECTED. MAY USE ON FACE UP TO 1 WEEK AT A TIME.  AVOID GROIN, UNDERARMS., Disp: 45 g, Rfl: 3   montelukast (SINGULAIR) 10 MG tablet, Take 1 tablet (10 mg total) by mouth at bedtime., Disp: 90 tablet, Rfl: 1   nortriptyline (PAMELOR) 25 MG capsule, , Disp: , Rfl:    nystatin (MYCOSTATIN) 100000 UNIT/ML suspension, , Disp: , Rfl:    olopatadine (PATANOL) 0.1 % ophthalmic solution, 1 drop 2 (two) times daily. (Patient not taking: Reported on 08/20/2023), Disp: , Rfl:    ondansetron (ZOFRAN) 8 MG tablet, Take 8 mg by mouth 3 (three) times daily. (Patient not taking: Reported on 08/20/2023), Disp: , Rfl:    pimecrolimus (ELIDEL) 1 % cream, APPLY TOPICALLY TO AREAS ON FACE, BODY, BODY FOLDS., Disp: 100 g, Rfl: 0   potassium chloride (KLOR-CON M) 10 MEQ tablet, Take 10 mEq by mouth 2 (two) times daily. (Patient not taking: Reported on 08/20/2023), Disp: , Rfl:    Potassium Chloride ER 20 MEQ TBCR, Take 20 mEq by mouth 2 (two) times daily., Disp: 180 tablet, Rfl: 3   prazosin (MINIPRESS) 2 MG capsule, Take 4 mg by mouth at bedtime., Disp: , Rfl:    propranolol (INDERAL) 80 MG tablet, Take 1 tablet (80 mg total) by mouth 3 (three) times daily., Disp: 270 tablet, Rfl: 3   rivaroxaban (XARELTO) 20 MG TABS tablet, Take 1 tablet (20 mg total) by mouth daily with supper., Disp: 90 tablet, Rfl: 1   rosuvastatin (CRESTOR) 20 MG tablet, Take 20 mg by mouth daily., Disp: , Rfl:    SPIRIVA RESPIMAT 1.25 MCG/ACT AERS, INHALE 2 PUFFS INTO THE LUNGS DAILY., Disp: 4 g, Rfl: 0   SYRINGE-NEEDLE, DISP, 3 ML (BD SAFETYGLIDE SYRINGE/NEEDLE) 25G X 1" 3 ML MISC, Use the needle for M injection once a month., Disp: 6 each, Rfl: 1   Topiramate ER (TROKENDI XR) 200 MG CP24, Take 1 capsule by mouth daily. , Disp: , Rfl:    tretinoin  (RETIN-A) 0.025 % cream, FOR ACNE APPLY TO FACE NIGHTLY AS TOLERATED., Disp: 45 g, Rfl: 5   venlafaxine XR (EFFEXOR-XR) 150 MG 24 hr capsule, Take 150 mg by mouth daily., Disp: , Rfl:    VICTOZA 18 MG/3ML SOPN, Inject 1.2 mg into the skin daily. (Patient not taking: Reported on 08/20/2023), Disp: , Rfl:    WEGOVY 2.4 MG/0.75ML SOAJ, SMARTSIG:2.4 Milligram(s) SUB-Q Once a Week (Patient not taking: Reported on 08/20/2023), Disp: , Rfl:  No current facility-administered medications for this visit.  Facility-Administered Medications Ordered in Other Visits:    iron sucrose (VENOFER) injection 200 mg, 200 mg, Intravenous, Once, Creig Hines, MD  Observations/Objective: Patient is well-developed, well-nourished in no acute distress.  Resting comfortably  at home.  Head is normocephalic, atraumatic.  No labored breathing.  Speech is clear and coherent with logical content.  Patient is alert and oriented at baseline.    Assessment and Plan:  1. Suspected UTI   Push fluids encouraged water   Meds ordered this encounter  Medications   nitrofurantoin, macrocrystal-monohydrate, (MACROBID) 100 MG capsule    Sig: Take 1 capsule (100 mg total) by mouth 2 (two) times daily for 5 days.    Dispense:  10 capsule    Refill:  0   phenazopyridine (PYRIDIUM) 100 MG tablet    Sig: Take 1 tablet (100 mg total) by mouth 3 (three) times daily as needed for up to 2 days for pain.    Dispense:  6 tablet    Refill:  0     Follow Up  Instructions: I discussed the assessment and treatment plan with the patient. The patient was provided an opportunity to ask questions and all were answered. The patient agreed with the plan and demonstrated an understanding of the instructions.  A copy of instructions were sent to the patient via MyChart unless otherwise noted below.    The patient was advised to call back or seek an in-person evaluation if the symptoms worsen or if the condition fails to improve as  anticipated.  Time:  I spent 11 minutes with the patient via telehealth technology discussing the above problems/concerns.    Viviano Simas, FNP

## 2023-09-05 ENCOUNTER — Ambulatory Visit: Payer: Medicare HMO | Admitting: Dermatology

## 2023-09-12 ENCOUNTER — Other Ambulatory Visit: Payer: Self-pay | Admitting: Nurse Practitioner

## 2023-09-12 DIAGNOSIS — R3989 Other symptoms and signs involving the genitourinary system: Secondary | ICD-10-CM

## 2023-09-26 ENCOUNTER — Telehealth: Payer: Medicare HMO | Admitting: Nurse Practitioner

## 2023-09-26 DIAGNOSIS — K5900 Constipation, unspecified: Secondary | ICD-10-CM

## 2023-09-26 NOTE — Progress Notes (Signed)
No show for Video Visit  

## 2023-10-17 ENCOUNTER — Other Ambulatory Visit: Payer: Self-pay | Admitting: Nurse Practitioner

## 2023-10-17 ENCOUNTER — Other Ambulatory Visit: Payer: Self-pay | Admitting: Dermatology

## 2023-10-17 DIAGNOSIS — L219 Seborrheic dermatitis, unspecified: Secondary | ICD-10-CM

## 2023-10-17 NOTE — Telephone Encounter (Signed)
Patient needs appt for further refills.

## 2023-10-31 ENCOUNTER — Encounter: Payer: Self-pay | Admitting: Oncology

## 2023-11-14 ENCOUNTER — Other Ambulatory Visit: Payer: Self-pay | Admitting: Dermatology

## 2023-11-14 DIAGNOSIS — L219 Seborrheic dermatitis, unspecified: Secondary | ICD-10-CM

## 2023-11-19 ENCOUNTER — Other Ambulatory Visit: Payer: Self-pay | Admitting: Unknown Physician Specialty

## 2023-11-19 DIAGNOSIS — D489 Neoplasm of uncertain behavior, unspecified: Secondary | ICD-10-CM

## 2023-11-27 ENCOUNTER — Inpatient Hospital Stay: Admission: RE | Admit: 2023-11-27 | Payer: Medicare HMO | Source: Ambulatory Visit

## 2023-11-28 ENCOUNTER — Encounter: Payer: Self-pay | Admitting: Oncology

## 2023-11-30 ENCOUNTER — Inpatient Hospital Stay: Admission: RE | Admit: 2023-11-30 | Payer: Medicare HMO | Source: Ambulatory Visit

## 2023-12-14 ENCOUNTER — Encounter: Payer: Self-pay | Admitting: Oncology

## 2023-12-20 ENCOUNTER — Inpatient Hospital Stay: Admission: RE | Admit: 2023-12-20 | Payer: Medicare HMO | Source: Ambulatory Visit

## 2024-01-02 ENCOUNTER — Encounter: Payer: Self-pay | Admitting: Oncology

## 2024-01-19 ENCOUNTER — Other Ambulatory Visit: Payer: Self-pay | Admitting: Nurse Practitioner

## 2024-01-19 ENCOUNTER — Other Ambulatory Visit: Payer: Self-pay | Admitting: Dermatology

## 2024-01-19 DIAGNOSIS — R3989 Other symptoms and signs involving the genitourinary system: Secondary | ICD-10-CM

## 2024-01-21 ENCOUNTER — Encounter: Payer: Self-pay | Admitting: Oncology

## 2024-02-07 ENCOUNTER — Encounter: Payer: Self-pay | Admitting: Emergency Medicine

## 2024-02-07 ENCOUNTER — Ambulatory Visit
Admission: EM | Admit: 2024-02-07 | Discharge: 2024-02-07 | Disposition: A | Discharge status: Home / Self Care | Attending: Emergency Medicine | Admitting: Emergency Medicine

## 2024-02-07 ENCOUNTER — Ambulatory Visit (INDEPENDENT_AMBULATORY_CARE_PROVIDER_SITE_OTHER)

## 2024-02-07 DIAGNOSIS — J069 Acute upper respiratory infection, unspecified: Secondary | ICD-10-CM | POA: Diagnosis not present

## 2024-02-07 DIAGNOSIS — R051 Acute cough: Secondary | ICD-10-CM | POA: Insufficient documentation

## 2024-02-07 LAB — RESP PANEL BY RT-PCR (FLU A&B, COVID) ARPGX2
Influenza A by PCR: NEGATIVE
Influenza B by PCR: NEGATIVE
SARS Coronavirus 2 by RT PCR: NEGATIVE

## 2024-02-07 LAB — GROUP A STREP BY PCR: Group A Strep by PCR: NOT DETECTED

## 2024-02-07 MED ORDER — ONDANSETRON 4 MG PO TBDP
4.0000 mg | ORAL_TABLET | Freq: Once | ORAL | Status: AC
  Administered 2024-02-07: 4 mg via ORAL

## 2024-02-07 MED ORDER — IPRATROPIUM BROMIDE 0.06 % NA SOLN: 2.0000 | Freq: Four times a day (QID) | NASAL | 12 refills | Status: AC

## 2024-02-07 MED ORDER — BENZONATATE 100 MG PO CAPS: 200.0000 mg | ORAL_CAPSULE | Freq: Three times a day (TID) | ORAL | 0 refills | Status: DC

## 2024-02-07 MED ORDER — PROMETHAZINE-DM 6.25-15 MG/5ML PO SYRP
5.0000 mL | ORAL_SOLUTION | Freq: Four times a day (QID) | ORAL | 0 refills | Status: DC | PRN
Start: 2024-02-07 — End: 2024-03-25

## 2024-02-07 MED ORDER — ONDANSETRON 8 MG PO TBDP: 8.0000 mg | ORAL_TABLET | Freq: Three times a day (TID) | ORAL | 0 refills | Status: AC | PRN

## 2024-02-07 NOTE — Discharge Instructions (Signed)
 Your testing today was negative for strep, COVID, influenza, and your chest x-ray did not show any evidence of pneumonia.  I do believe you have a viral illness which is causing your symptoms.  Use over-the-counter Tylenol according the package instructions as needed for any fever or pain.  Use the Zofran every 8 hours as needed for nausea or vomiting.  Continue to orally rehydrate using water, Gatorade, Pedialyte, broth.  Use the Atrovent nasal spray, 2 squirts in each nostril every 6 hours, as needed for runny nose and postnasal drip.  Use the Tessalon Perles every 8 hours during the day.  Take them with a small sip of water.  They may give you some numbness to the base of your tongue or a metallic taste in your mouth, this is normal.  Use the Promethazine DM cough syrup at bedtime for cough and congestion.  It will make you drowsy so do not take it during the day.  Return for reevaluation or see your primary care provider for any new or worsening symptoms.

## 2024-02-07 NOTE — ED Triage Notes (Signed)
 Patient presents with c/o productive cough, nasal congestion, vomiting, generalized body aches and nasal congestion x 4 days. Patient states she is taking mucinex and cough medicine for the symptoms.

## 2024-02-07 NOTE — ED Provider Notes (Addendum)
 [facility]    CSN: [id number] Arrival date & time: 02/07/24  1041      History   Chief Complaint Chief Complaint  Patient presents with   Nasal Congestion   Cough   Generalized Body Aches   Emesis    HPI Andrea Miller is a 38 y.o. female.   HPI  38 year old female with a past medical history significant for congestive heart failure with preserved ejection fraction, SVT, anxiety, hypertension, asthma, obesity, depression, fibromyalgia, seizures, PE, bipolar disorder, and prediabetes presents for evaluation of respiratory symptoms that started 4 days ago with a fever, nasal congestion, and bodyaches.  She also endorses a sore throat.  When she went to the shelter there was another person there with respiratory issues and at that point she started to experience a worsening cough that is productive for green sputum, shortness breath, wheezing, and she is also been experiencing nausea and vomiting.  She reports she is unable to keep down fluids.  She denies nasal discharge, ear pain, or diarrhea.  Past Medical History:  Diagnosis Date   Anxiety    Asthma    Bipolar disorder (HCC)    Chest pain    and angina   CHF (congestive heart failure) (HCC)    Depression    Fibromyalgia    Hypertension    Left lumbar radiculopathy since 08/2014   secondary to work accident   Obesity    Pre-diabetes    Pulmonary embolus (HCC)    Seizures (HCC)    secondary to anxiety  last seizure 01/25/18   SVT (supraventricular tachycardia) (HCC)    with hx of syncope; managed by [facility]    Patient Active Problem List   Diagnosis Date Noted   Cecal diverticulitis 10/27/2020   Generalized anxiety disorder with panic attacks 10/27/2020   Heart failure with preserved ejection fraction (HCC) 10/27/2020   History of pulmonary embolism 10/27/2020   Low back pain 12/31/2019   Chronic anticoagulation 05/07/2019   Iron deficiency anemia 10/15/2018   B12 deficiency 10/13/2018    Symptomatic anemia 10/11/2018   Multiple subsegmental pulmonary emboli without acute cor pulmonale (HCC) 09/15/2018   Morbid obesity with BMI of 50.0-59.9, adult (HCC) 09/15/2018   Chronic diastolic heart failure (HCC) 07/01/2018   Lymphedema 07/01/2018   Chronic cholecystitis    Edema 02/13/2018   Diabetes mellitus type 2, uncomplicated (HCC) 02/13/2018   Hypertension 11/23/2017   Asthma exacerbation 04/14/2017   Asthma without status asthmaticus 01/16/2017   Bipolar affective (HCC) 01/16/2017   Coronary artery disease 01/16/2017   Syncope 12/12/2016   Fibromyalgia 04/27/2014   OSA (obstructive sleep apnea) 04/27/2014   Seizure-like activity (HCC) 04/27/2014   Migraine without status migrainosus, not intractable 04/27/2014   Obesity 04/15/2014   Sleep disorder 04/15/2014   Neck pain 04/15/2014   Headache 04/15/2014   Restless legs syndrome 04/15/2014   Supraventricular tachycardia (HCC) 07/13/2009    Past Surgical History:  Procedure Laterality Date   CHOLECYSTECTOMY N/A 02/22/2018   Procedure: LAPAROSCOPIC CHOLECYSTECTOMY;  Surgeon: Ancil Linsey, MD;  Location: [facility] ORS;  Service: General;  Laterality: N/A;   TONSILLECTOMY AND ADENOIDECTOMY N/A 09/05/2016   Procedure: TONSILLECTOMY AND ADENOIDECTOMY;  Surgeon: Linus Salmons, MD;  Location: [facility] ORS;  Service: ENT;  Laterality: N/A;    OB History     Gravida  0   Para  0   Term  0   Preterm  0   AB  0   Living  0  SAB  0   IAB  0   Ectopic  0   Multiple  0   Live Births               Home Medications    Prior to Admission medications   Medication Sig Start Date End Date Taking? Authorizing Provider  benzonatate (TESSALON) 100 MG capsule Take 2 capsules (200 mg total) by mouth every 8 (eight) hours. 02/07/24  Yes Becky Augusta, NP  cyanocobalamin (VITAMIN B12) 1000 MCG/ML injection Inject 1,000 mcg into the muscle every 30 (thirty) days. 11/22/23  Yes [provider]   ipratropium (ATROVENT) 0.06 % nasal spray Place 2 sprays into both nostrils 4 (four) times daily. 02/07/24  Yes Becky Augusta, NP  ondansetron (ZOFRAN-ODT) 8 MG disintegrating tablet Take 1 tablet (8 mg total) by mouth every 8 (eight) hours as needed for nausea or vomiting. 02/07/24  Yes Becky Augusta, NP  promethazine-dextromethorphan (PROMETHAZINE-DM) 6.25-15 MG/5ML syrup Take 5 mLs by mouth 4 (four) times daily as needed. 02/07/24  Yes Becky Augusta, NP  Adapalene 0.3 % gel APPLY 1 APPLICATION. TOPICALLY AT BEDTIME. 05/17/23   Willeen Niece, MD  albuterol (PROVENTIL) (2.5 MG/3ML) 0.083% nebulizer solution Take 3 mLs (2.5 mg total) by nebulization every 4 (four) hours as needed for wheezing or shortness of breath. 09/23/19   Salena Saner, MD  Albuterol Sulfate (PROAIR RESPICLICK) 108 (90 Base) MCG/ACT AEPB Inhale 2 puffs into the lungs every 6 (six) hours as needed. 06/27/21   Glenford Bayley, NP  amphetamine-dextroamphetamine (ADDERALL XR) 15 MG 24 hr capsule Patient says she is taking 25 mg daily now 12/15/20   [provider]  ARIPiprazole (ABILIFY) 20 MG tablet Take 1 tablet by mouth daily. 05/23/22   [provider]  baclofen (LIORESAL) 10 MG tablet Take 1 tablet (10 mg total) by mouth 3 (three) times daily. 04/12/23   Becky Augusta, NP  budesonide-formoterol [facility]) 160-4.5 MCG/ACT inhaler Inhale 2 puffs into the lungs in the morning and at bedtime. 07/07/21   Glenford Bayley, NP  buprenorphine Lavera Guise) 15 MCG/HR Place 1 patch onto the skin once a week. 10/01/20   [provider]  busPIRone (BUSPAR) 15 MG tablet  04/26/21   [provider]  Calcium-Magnesium 500-250 MG TABS Take 1 tablet by mouth 2 (two) times daily.     [provider]  diltiazem (CARDIZEM CD) 360 MG 24 hr capsule Take 1 capsule (360 mg total) by mouth every morning. 04/14/22   Delma Freeze, FNP  fluticasone (FLONASE) 50 MCG/ACT nasal spray Place 2 sprays into both nostrils  daily as needed for allergies. 07/16/20   Glenford Bayley, NP  folic acid (FOLVITE) 1 MG tablet Take 1 mg by mouth daily. 10/16/22   [provider]  furosemide (LASIX) 40 MG tablet Take 1 tablet (40 mg total) by mouth daily. 04/14/22   Delma Freeze, FNP  gabapentin (NEURONTIN) 800 MG tablet Take 800 mg by mouth 3 (three) times daily.    [provider]  hydrocortisone 2.5 % cream APPLY TO ECZEMA ON FACE 1-2 TIMES A DAY AS NEEDED UNTIL RASH IMPROVED. 12/21/22   Willeen Niece, MD  hydroxypropyl methylcellulose / hypromellose (ISOPTO TEARS / GONIOVISC) 2.5 % ophthalmic solution Place 1 drop into both eyes 3 (three) times daily as needed for dry eyes.    [provider]  ketoconazole (NIZORAL) 2 % cream APPLY 1-2 TIMES A DAY TO ITCHY RASH FACE, SCALP, UNDER BREAST UNTIL CLEAR,  THEN AS NEEDED FOR FLARES 05/17/23   Willeen Niece, MD  ketoconazole (NIZORAL) 2 % shampoo WASH SCALP 1-2 TIMES A WEEK, LET SIT 5 MINUTES AND RINSE OUT 10/17/23   Willeen Niece, MD  [employer] PEN NEEDLES 31G X 6 MM MISC SMARTSIG:pre-filled pen syringe injection Daily 08/16/22   [provider]  lactulose (CHRONULAC) 10 GM/15ML solution Take 30 g by mouth daily as needed for mild constipation. Patient not taking: Reported on 08/20/2023 08/28/18   [provider]  lamoTRIgine (LAMICTAL) 200 MG tablet Take 1 tablet by mouth 2 (two) times a day. 05/20/19   [provider]  lidocaine (XYLOCAINE) 5 % ointment Apply 1 Application topically. 2-3 times per week 05/23/22   [provider]  linaclotide (LINZESS) 290 MCG CAPS capsule Take 290 mcg by mouth daily before breakfast.    [provider]  LORazepam (ATIVAN) 1 MG tablet Take 1 tablet by mouth 2 (two) times daily. 05/23/22   [provider]  Lurasidone HCl (LATUDA) 60 MG TABS Take 1 tablet by mouth in the morning.    [provider]  meclizine (ANTIVERT) 25 MG tablet  07/14/21   [provider]   medroxyPROGESTERone (PROVERA) 5 MG tablet Take 5 mg by mouth daily. 10/16/22   [provider]  mometasone (ELOCON) 0.1 % cream APPLY TO MORE SEVERE AREAS ECZEMA 1-2 TIMES AS DIRECTED. MAY USE ON FACE UP TO 1 WEEK AT A TIME. AVOID GROIN, UNDERARMS. 02/19/23   Willeen Niece, MD  montelukast (SINGULAIR) 10 MG tablet Take 1 tablet (10 mg total) by mouth at bedtime. 07/16/20   Glenford Bayley, NP  nortriptyline (PAMELOR) 25 MG capsule  06/14/21   [provider]  nystatin (MYCOSTATIN) 100000 UNIT/ML suspension  05/18/21   [provider]  olopatadine (PATANOL) 0.1 % ophthalmic solution 1 drop 2 (two) times daily. Patient not taking: Reported on 08/20/2023 05/23/22   [provider]  ondansetron (ZOFRAN) 8 MG tablet Take 8 mg by mouth 3 (three) times daily. Patient not taking: Reported on 08/20/2023 10/03/22   [provider]  pimecrolimus (ELIDEL) 1 % cream APPLY TOPICALLY TO AREAS ON FACE, BODY, BODY FOLDS. 05/17/23   Willeen Niece, MD  potassium chloride (KLOR-CON M) 10 MEQ tablet Take 10 mEq by mouth 2 (two) times daily. Patient not taking: Reported on 08/20/2023 07/26/22   [provider]  Potassium Chloride ER 20 MEQ TBCR Take 20 mEq by mouth 2 (two) times daily. 04/14/22   Delma Freeze, FNP  prazosin (MINIPRESS) 2 MG capsule Take 4 mg by mouth at bedtime. 04/18/22   [provider]  propranolol (INDERAL) 80 MG tablet Take 1 tablet (80 mg total) by mouth 3 (three) times daily. 04/14/22   Delma Freeze, FNP  rivaroxaban (XARELTO) 20 MG TABS tablet TAKE 1 TABLET BY MOUTH DAILY WITH SUPPER. 10/31/23   Alinda Dooms, NP  rosuvastatin (CRESTOR) 20 MG tablet Take 20 mg by mouth daily.    [provider]  SPIRIVA RESPIMAT 1.25 MCG/ACT AERS INHALE 2 PUFFS INTO THE LUNGS DAILY. 05/13/21   Glenford Bayley, NP  SYRINGE-NEEDLE, DISP, 3 ML (B-D 3CC LUER-LOK SYR 25GX1") 25G X 1" 3 ML MISC USE THE NEEDLE FOR M INJECTION ONCE A MONTH. 01/21/24    Creig Hines, MD  Topiramate ER (TROKENDI XR) 200 MG CP24 Take 1 capsule by mouth daily.     [provider]  tretinoin (RETIN-A) 0.025 % cream FOR ACNE APPLY TO  FACE NIGHTLY AS TOLERATED. 10/17/23   Willeen Niece, MD  venlafaxine XR (EFFEXOR-XR) 150 MG 24 hr capsule Take 150 mg by mouth daily. 03/01/22   [provider]  VICTOZA 18 MG/3ML SOPN Inject 1.2 mg into the skin daily. Patient not taking: Reported on 08/20/2023 08/16/22   [provider]  WEGOVY 2.4 MG/0.75ML SOAJ SMARTSIG:2.4 Milligram(s) SUB-Q Once a Week Patient not taking: Reported on 08/20/2023 09/19/22   [provider]  dicyclomine (BENTYL) 10 MG capsule Take 1 capsule (10 mg total) by mouth every 6 (six) hours as needed for up to 7 days for spasms (abdominal pain). 04/18/20 08/03/20  Sharman Cheek, MD    Family History Family History  Problem Relation Age of Onset   Diabetes Mother    Hypertension Mother    Asthma Mother    Gallstones Mother    Diabetes Father    Hypertension Father    Gallstones Father    Bipolar disorder Sister    COPD Brother    Schizophrenia Brother    COPD Brother    Hypertension Brother     Social History Social History   Tobacco Use   Smoking status: Never   Smokeless tobacco: Never  Vaping Use   Vaping status: Never Used  Substance Use Topics   Alcohol use: Not Currently   Drug use: No     Allergies   Contrast media [iodinated contrast media], Garlic, Prednisone, Amoxicillin, Corticosteroids, and Penicillins   Review of Systems Review of Systems  Constitutional:  Positive for fever.  HENT:  Positive for congestion, rhinorrhea and sore throat. Negative for ear pain.   Respiratory:  Positive for cough, shortness of breath and wheezing.   Gastrointestinal:  Positive for nausea and vomiting. Negative for diarrhea.  Musculoskeletal:  Positive for arthralgias and myalgias.  Skin:  Negative for rash.  Neurological:  Positive for headaches.      Physical Exam Triage Vital Signs ED Triage Vitals  Encounter Vitals Group     BP 02/07/24 1115 118/84     Systolic BP Percentile --      Diastolic BP Percentile --      Pulse Rate 02/07/24 1115 81     Resp 02/07/24 1115 18     Temp 02/07/24 1115 98.6 F (37 C)     Temp Source 02/07/24 1115 Oral     SpO2 02/07/24 1115 94 %     Weight --      Height --      Head Circumference --      Peak Flow --      Pain Score 02/07/24 1109 8     Pain Loc --      Pain Education --      Exclude from Growth Chart --    No data found.  Updated Vital Signs BP 118/84 (BP Location: Right Arm)   Pulse 81   Temp 98.6 F (37 C) (Oral)   Resp 18   LMP 01/25/2024   SpO2 94%   Visual Acuity Right Eye Distance:   Left Eye Distance:   Bilateral Distance:    Right Eye Near:   Left Eye Near:    Bilateral Near:     Physical Exam Vitals and nursing note reviewed.  Constitutional:      Appearance: Normal appearance. She is not ill-appearing.  HENT:     Head: Normocephalic and atraumatic.     Right Ear: Tympanic membrane, ear canal and external ear normal. There is no impacted  cerumen.     Left Ear: Tympanic membrane, ear canal and external ear normal. There is no impacted cerumen.     Nose: Congestion and rhinorrhea present.     Comments: Nasal mucosa is edematous and erythematous with clear discharge in both nares.    Mouth/Throat:     Mouth: Mucous membranes are moist.     Pharynx: Oropharynx is clear. Posterior oropharyngeal erythema present. No oropharyngeal exudate.     Comments: Tonsillar pillars are 1+ edematous and erythematous but free of exudate. Cardiovascular:     Rate and Rhythm: Normal rate and regular rhythm.     Pulses: Normal pulses.     Heart sounds: Normal heart sounds. No murmur heard.    No friction rub. No gallop.  Pulmonary:     Effort: Pulmonary effort is normal.     Breath sounds: Normal breath sounds. No wheezing, rhonchi or rales.  Musculoskeletal:      Cervical back: Normal range of motion and neck supple. No tenderness.  Lymphadenopathy:     Cervical: No cervical adenopathy.  Skin:    General: Skin is warm and dry.     Capillary Refill: Capillary refill takes less than 2 seconds.     Findings: No rash.  Neurological:     General: No focal deficit present.     Mental Status: She is alert and oriented to person, place, and time.      UC Treatments / Results  Labs (all labs ordered are listed, but only abnormal results are displayed) Labs Reviewed  RESP PANEL BY RT-PCR (FLU A&B, COVID) [id number]  GROUP A STREP BY PCR    EKG   Radiology No results found.  Procedures Procedures (including critical care time)  Medications Ordered in UC Medications  ondansetron (ZOFRAN-ODT) disintegrating tablet 4 mg (4 mg Oral Given 02/07/24 1140)    Initial Impression / Assessment and Plan / UC Course  I have reviewed the triage vital signs and the nursing notes.  Pertinent labs & imaging results that were available during my care of the patient were reviewed by me and considered in my medical decision making (see chart for details).   Patient is a nontoxic-appearing 38 year old female presenting for evaluation of flulike symptoms as outlined HPI above.  Her physical exam does reveal inflammation of her upper respiratory tract as evidenced by inflamed nasal mucosa with clear nasal discharge.  Also edematous and erythematous tonsillar pillars without appreciable exudate.  No cervical lymphadenopathy appreciable exam.  Cardiopulmonary exam reveals good lung sounds in all fields.  Patient is requesting a chest x-ray so I will order a chest x-ray to evaluate for any acute cardiopulmonary process.  I will also order a COVID and flu PCR and strep PCR.  Additionally, I will staff administer 4 mg of Zofran ODT and attempted p.o. fluid challenge approximately 20 minutes after Zofran administration.  Chest x-ray independently reviewed and evaluated by me.   Impression: Lung fields are well aerated without evidence of infiltrate or effusion.  Cardiomediastinal silhouette reflects megaly.  Radiology overread is pending. Radiology impression states no acute chest findings.  Strep PCR is negative.  Respiratory panel is negative for COVID or influenza.  Patient is tolerating p.o. challenge and has had no further nausea or vomiting.  I will discharge patient with a diagnosis of viral URI with a cough with a prescription for Atrovent nasal spray to help with nasal congestion.  Tessalon Perles and Promethazine DM cough syrup for cough and congestion.  She  may use over-the-counter Tylenol as needed for fever or pain.  I will also prescribe Zofran that she continues every 8 hours as needed for nausea or vomiting.  Return precautions reviewed.   Final Clinical Impressions(s) / UC Diagnoses   Final diagnoses:  Acute cough  Viral URI with cough     Discharge Instructions      Your testing today was negative for strep, COVID, influenza, and your chest x-ray did not show any evidence of pneumonia.  I do believe you have a viral illness which is causing your symptoms.  Use over-the-counter Tylenol according the package instructions as needed for any fever or pain.  Use the Zofran every 8 hours as needed for nausea or vomiting.  Continue to orally rehydrate using water, Gatorade, Pedialyte, broth.  Use the Atrovent nasal spray, 2 squirts in each nostril every 6 hours, as needed for runny nose and postnasal drip.  Use the Tessalon Perles every 8 hours during the day.  Take them with a small sip of water.  They may give you some numbness to the base of your tongue or a metallic taste in your mouth, this is normal.  Use the Promethazine DM cough syrup at bedtime for cough and congestion.  It will make you drowsy so do not take it during the day.  Return for reevaluation or see your primary care provider for any new or worsening symptoms.      ED  Prescriptions     Medication Sig Dispense Auth. Provider   benzonatate (TESSALON) 100 MG capsule Take 2 capsules (200 mg total) by mouth every 8 (eight) hours. 21 capsule Becky Augusta, NP   ipratropium (ATROVENT) 0.06 % nasal spray Place 2 sprays into both nostrils 4 (four) times daily. 15 mL Becky Augusta, NP   promethazine-dextromethorphan (PROMETHAZINE-DM) 6.25-15 MG/5ML syrup Take 5 mLs by mouth 4 (four) times daily as needed. 118 mL Becky Augusta, NP   ondansetron (ZOFRAN-ODT) 8 MG disintegrating tablet Take 1 tablet (8 mg total) by mouth every 8 (eight) hours as needed for nausea or vomiting. 20 tablet Becky Augusta, NP      PDMP not reviewed this encounter.   Becky Augusta, NP 02/07/24 1219    Becky Augusta, NP 02/07/24 [id number]

## 2024-02-11 ENCOUNTER — Inpatient Hospital Stay: Payer: Medicare HMO | Attending: Oncology

## 2024-02-21 ENCOUNTER — Telehealth: Payer: Self-pay | Admitting: Cardiology

## 2024-02-21 NOTE — Telephone Encounter (Signed)
 Patient c/o Palpitations:  STAT if patient reporting lightheadedness, shortness of breath, or chest pain  How long have you had palpitations/irregular HR/ Afib? Are you having the symptoms now? Fluttering- been having SVT's  Are you currently experiencing lightheadedness, SOB or CP? Having chest pains and shortness of breath  Do you have a history of afib (atrial fibrillation) or irregular heart rhythm?   Have you checked your BP or HR? (document readings if available):   Are you experiencing any other symptoms? Feet are swollen-i

## 2024-02-21 NOTE — Telephone Encounter (Signed)
 Spoke with patient and reviewed her symptoms. She reports fast heart rates, chest pain, shortness of breath, and feet swollen. Discussed that the symptoms are concerning and that she should proceed to ED for further evaluation and treatment. She verbalized understanding.

## 2024-02-22 ENCOUNTER — Other Ambulatory Visit: Payer: Self-pay | Admitting: Nurse Practitioner

## 2024-02-27 ENCOUNTER — Encounter: Payer: Self-pay | Admitting: Oncology

## 2024-03-05 ENCOUNTER — Ambulatory Visit: Admitting: Dermatology

## 2024-03-18 NOTE — Progress Notes (Deleted)
   Cardiology Clinic Note   Date: 03/18/2024 ID: Andrea, Miller August 22, 1986, MRN [id number]  Primary Cardiologist:  None  Chief Complaint   Andrea Miller is a 38 y.o. female who presents to the clinic today for ***  Patient Profile   Andrea Miller is followed by Dr. Junnie Olives for the history outlined below.      Past medical history significant for: Chronic HFpEF. Echo 09/16/2018: EF 55 to 60%.  Borderline LVH.  No RWMA.  Normal diastolic parameters.  Mild RVH. Palpitations/SVT. Hypertension. Hyperlipidemia. PE. OSA. Asthma. T2DM. Fibromyalgia. Lymphedema.  In summary, patient was first evaluated by Dr. Junnie Olives on 07/22/2021 to establish care for SVT.  She had previously been worked up for syncopal episode.  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  She had previously been followed by [facility] for cardiac care.  She reported lower extremity edema managed with Lasix .  She was pursuing bariatric surgery which was unfortunately delayed secondary to development of PE while on contraceptives.  Her last echo was in October 2019 and showed normal LV function as detailed above.  No medication changes were made at the time of her visit.  She was not seen in follow-up.  Patient contacted the office on 02/21/2024 with complaints of palpitations, shortness of breath, lower extremity edema.  She was instructed to present to the ED for further evaluation.     History of Present Illness    Today, patient ***  Chronic HFpEF Echo October 2019 showed normal LV function, borderline LVH, no RWMA, normal diastolic parameters, mild RVH.  Patient*** Euvolemic and well compensated on exam. - Continue propranolol , Lasix .  Palpitations/PSVT Patient***EKG*** - Continue propranolol , diltiazem .***  Hypertension BP today*** Continue diltiazem , propranolol .  Hyperlipidemia No recent lipid panel in the system. - Lipid  panel and LFTs today*** - Continue rosuvastatin .  History of PE Patient***  ROS: All other systems reviewed and are otherwise negative except as noted in History of Present Illness.  EKGs/Labs Reviewed        No results found for requested labs within last 365 days.   08/17/2023: Hemoglobin 12.5; WBC Count 4.0   No results found for requested labs within last 365 days.   No results found for requested labs within last 365 days.  ***  Risk Assessment/Calculations    {Does this patient have ATRIAL FIBRILLATION?[phone]} No BP recorded.  {Refresh Note OR Click here to enter BP  :1}***        Physical Exam    VS:  There were no vitals taken for this visit. , BMI There is no height or weight on file to calculate BMI.  GEN: Well nourished, well developed, in no acute distress. Neck: No JVD or carotid bruits. Cardiac: *** RRR. No murmurs. No rubs or gallops.   Respiratory:  Respirations regular and unlabored. Clear to auscultation without rales, wheezing or rhonchi. GI: Soft, nontender, nondistended. Extremities: Radials/DP/PT 2+ and equal bilaterally. No clubbing or cyanosis. No edema ***  Skin: Warm and dry, no rash. Neuro: Strength intact.  Assessment & Plan   ***  Disposition: ***     {Are you ordering a CV Procedure (e.g. stress test, cath, DCCV, TEE, etc)?   Press F2        :[id number]}   Signed, Lonell Rives. Lissete Maestas, DNP, NP-C

## 2024-03-19 ENCOUNTER — Ambulatory Visit: Admitting: Dermatology

## 2024-03-20 ENCOUNTER — Ambulatory Visit: Attending: Student | Admitting: Student

## 2024-03-24 ENCOUNTER — Encounter: Payer: Self-pay | Admitting: Oncology

## 2024-03-24 NOTE — Progress Notes (Unsigned)
 Cardiology Clinic Note   Date: 03/25/2024 ID: Andrea Miller, DOB 1985-12-15, MRN [id number]  Primary Cardiologist:  Constancia Delton, MD  Chief Complaint   [name] is a 38 y.o. female who presents to the clinic today for evaluation of increased palpitations and shortness of breath.   Patient Profile   Andrea Miller is followed by Dr. Junnie Olives for the history outlined below.      Past medical history significant for: Chronic HFpEF. Echo 09/16/2018: EF 55 to 60%.  Borderline LVH.  No RWMA.  Normal diastolic parameters.  Mild RVH. Palpitations/SVT. Hypertension. Hyperlipidemia. PE. OSA. Asthma. T2DM. Fibromyalgia. Lymphedema.  In summary, patient was first evaluated by Dr. Junnie Olives on 07/22/2021 to establish care for SVT.  She had previously been worked up for syncopal episode.  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  She had previously been followed by [facility] for cardiac care.  She reported lower extremity edema managed with Lasix .  She was pursuing bariatric surgery which was unfortunately delayed secondary to development of PE while on contraceptives.  Her last echo was in October 2019 and showed normal LV function as detailed above.  No medication changes were made at the time of her visit.  She was not seen in follow-up.  Patient contacted the office on 02/21/2024 with complaints of palpitations, shortness of breath, lower extremity edema.  She was instructed to present to the ED for further evaluation.     History of Present Illness    Today, patient has a constellation of complaints. She reports chest pain, shortness of breath, palpitations, presyncope and fatigue. This has been continuous for years. She has been out of diltiazem  for 1-2 months. She felt she had better control of palpitations when she was taking a lower dose of diltiazem  twice a day. She describes chest pain as sharp and  beginning in the center of the chest and moving to the left with and without exertion lasting for minutes to hours and coming and going sometimes over several days.  Pain will sometimes occur with palpitations and be relieved with use of propranolol .  She can also experience pain without palpitations.  She experiences her heart racing at night and in the early morning.  She also has runs of SVT throughout the day.  She does not know how high her heart rate is getting.  She reports chronic lower extremity edema and takes daily Lasix  for management. She reports she was able to walk into the building today without difficulty but if she is having palpitations she can't walk more than a few steps without feeling dyspneic and presyncopal.      ROS: All other systems reviewed and are otherwise negative except as noted in History of Present Illness.  EKGs/Labs Reviewed    EKG Interpretation Date/Time:  Tuesday March 25 2024 10:22:23 EDT Ventricular Rate:  89 PR Interval:  162 QRS Duration:  84 QT Interval:  354 QTC Calculation: 430 R Axis:   40  Text Interpretation: Normal sinus rhythm Normal ECG When compared with ECG of 03-Feb-2021 21:44, No significant change was found Confirmed by Morey Ar [phone]) on 03/25/2024 10:39:07 AM   No results found for requested labs within last 365 days.   08/17/2023: Hemoglobin 12.5; WBC Count 4.0       Physical Exam    VS:  BP 126/84 (BP Location: Right Leg)   Ht 5\' 9"  (1.753 m)   Wt Andrea Miller)  397 lb 12.8 oz (180.4 kg)   BMI 58.74 kg/m  , BMI Body mass index is 58.74 kg/m.  GEN: Well nourished, well developed, in no acute distress. Neck: No JVD or carotid bruits. Cardiac:  RRR. No murmurs. No rubs or gallops.   Respiratory:  Respirations regular and unlabored. Clear to auscultation without rales, wheezing or rhonchi. GI: Soft, nontender, nondistended. Extremities: Radials/DP/PT 2+ and equal bilaterally. No clubbing or cyanosis. Trace edema bilateral  lower extremities.   Skin: Warm and dry, no rash. Neuro: Strength intact.  Assessment & Plan   Chronic HFpEF Echo October 2019 showed normal LV function, borderline LVH, no RWMA, normal diastolic parameters, mild RVH.  Patient reports chronic lower extremity edema she manages with lymphedema pumps and Lasix . Volume is difficult to ascertain secondary to body habitus. She has trace edema bilaterally today.  Euvolemic and well compensated on exam. - Continue propranolol . - Decrease Lasix  to 20 mg daily with extra dose as needed for increased edema.  - BMP today. - Update echo.   Palpitations/PSVT Patient reports increased palpitations with associated chest pain, shortness of breath, fatigue, and presyncope. She does not know how high her HR gets when she is having palpitations. She feels she had better control of palpitations when she was taking diltiazem  at a lower dose twice a day. The higher dose all at once makes her sleepy. She has been out of diltiazem  for 1-2 months. EKG today is NSR 89 bpm.  - Continue propranolol . - Decrease diltiazem  to 180 mg bid.  - 14 day zio.  - CBC today.   Chest pain Patient reports chest pain with and without exertion and occurring mainly when she has palpitations. Pain is described as sharp starting in the center of chest and radiating to the left. Pain is relieved with propranolol .  -Will defer ischemic evaluation at this time.   Hypertension BP today 126/84. Continue diltiazem , propranolol .  Hyperlipidemia No recent lipid panel in the system. - Lipid panel with direct LDL today.  - Continue rosuvastatin .  Disposition: CBC, CMP, lipid panel, direct LDL today. Echo. 14 day Zio. Return in 6-8 weeks or sooner as needed.          Signed, Andrea Miller. Andrea Wisehart, DNP, NP-C

## 2024-03-25 ENCOUNTER — Other Ambulatory Visit: Payer: Self-pay

## 2024-03-25 ENCOUNTER — Emergency Department

## 2024-03-25 ENCOUNTER — Ambulatory Visit: Attending: Student | Admitting: Student

## 2024-03-25 ENCOUNTER — Ambulatory Visit

## 2024-03-25 ENCOUNTER — Encounter: Payer: Self-pay | Admitting: Emergency Medicine

## 2024-03-25 ENCOUNTER — Encounter: Payer: Self-pay | Admitting: Student

## 2024-03-25 VITALS — BP 126/84 | Ht 69.0 in | Wt 397.8 lb

## 2024-03-25 DIAGNOSIS — Z79899 Other long term (current) drug therapy: Secondary | ICD-10-CM

## 2024-03-25 DIAGNOSIS — R079 Chest pain, unspecified: Secondary | ICD-10-CM

## 2024-03-25 DIAGNOSIS — R0609 Other forms of dyspnea: Secondary | ICD-10-CM | POA: Diagnosis not present

## 2024-03-25 DIAGNOSIS — I5032 Chronic diastolic (congestive) heart failure: Secondary | ICD-10-CM

## 2024-03-25 DIAGNOSIS — M549 Dorsalgia, unspecified: Secondary | ICD-10-CM | POA: Diagnosis not present

## 2024-03-25 DIAGNOSIS — I10 Essential (primary) hypertension: Secondary | ICD-10-CM | POA: Diagnosis not present

## 2024-03-25 DIAGNOSIS — Z5321 Procedure and treatment not carried out due to patient leaving prior to being seen by health care provider: Secondary | ICD-10-CM | POA: Diagnosis not present

## 2024-03-25 DIAGNOSIS — M25561 Pain in right knee: Secondary | ICD-10-CM | POA: Diagnosis not present

## 2024-03-25 DIAGNOSIS — Y92410 Unspecified street and highway as the place of occurrence of the external cause: Secondary | ICD-10-CM | POA: Insufficient documentation

## 2024-03-25 DIAGNOSIS — R002 Palpitations: Secondary | ICD-10-CM

## 2024-03-25 DIAGNOSIS — M25511 Pain in right shoulder: Secondary | ICD-10-CM | POA: Diagnosis not present

## 2024-03-25 DIAGNOSIS — I471 Supraventricular tachycardia, unspecified: Secondary | ICD-10-CM

## 2024-03-25 MED ORDER — POTASSIUM CHLORIDE CRYS ER 10 MEQ PO TBCR: 10.0000 meq | EXTENDED_RELEASE_TABLET | Freq: Two times a day (BID) | ORAL | 3 refills | Status: AC

## 2024-03-25 MED ORDER — DILTIAZEM HCL ER COATED BEADS 180 MG PO CP24: 360.0000 mg | ORAL_CAPSULE | Freq: Every morning | ORAL | 3 refills | Status: AC

## 2024-03-25 MED ORDER — ROSUVASTATIN CALCIUM 20 MG PO TABS: 20.0000 mg | ORAL_TABLET | Freq: Every day | ORAL | 3 refills | Status: DC

## 2024-03-25 MED ORDER — PROPRANOLOL HCL 80 MG PO TABS: 80.0000 mg | ORAL_TABLET | Freq: Three times a day (TID) | ORAL | 3 refills | Status: AC

## 2024-03-25 MED ORDER — FUROSEMIDE 20 MG PO TABS: 20.0000 mg | ORAL_TABLET | Freq: Every day | ORAL | 2 refills | Status: DC

## 2024-03-25 NOTE — Patient Instructions (Signed)
 Medication Instructions:   Decrease Diltiazem  to 180 MG twice daily.  Decrease Lasix  to 20 MG daily with an extra 20 MG daily as needed.   *If you need a refill on your cardiac medications before your next appointment, please call your pharmacy*  Lab Work: Your provider would like for you to have following labs drawn today CBC, CMP, Lipid Panel and Direct LDL.   If you have labs (blood work) drawn today and your tests are completely normal, you will receive your results only by: MyChart Message (if you have MyChart) OR A paper copy in the mail If you have any lab test that is abnormal or we need to change your treatment, we will call you to review the results.  Testing/Procedures: Your physician has requested that you have an echocardiogram. Echocardiography is a painless test that uses sound waves to create images of your heart. It provides your doctor with information about the size and shape of your heart and how well your heart's chambers and valves are working.   You may receive an ultrasound enhancing agent through an IV if needed to better visualize your heart during the echo. This procedure takes approximately one hour.  There are no restrictions for this procedure.  This will take place at [address] [facility], [place] [address]  Please note: We ask at that you not bring children with you during ultrasound (echo/ vascular) testing. Due to room size and safety concerns, children are not allowed in the ultrasound rooms during exams. Our front office staff cannot provide observation of children in our lobby area while testing is being conducted. An adult accompanying a patient to their appointment will only be allowed in the ultrasound room at the discretion of the ultrasound technician under special circumstances. We apologize for any inconvenience.   ZIO XT- Long Term Monitor Instructions  Your physician has requested you wear a ZIO patch monitor for 14  days.  This is a single patch monitor. [employer] supplies one patch monitor per enrollment. Additional stickers are not available. Please do not apply patch if you will be having a Nuclear Stress Test,  Echocardiogram, Cardiac CT, MRI, or Chest Xray during the period you would be wearing the  monitor. The patch cannot be worn during these tests. You cannot remove and re-apply the  ZIO XT patch monitor.  Your ZIO patch monitor will be mailed 3 day USPS to your address on file. It may take 3-5 days  to receive your monitor after you have been enrolled.  Once you have received your monitor, please review the enclosed instructions. Your monitor  has already been registered assigning a specific monitor serial # to you.  Billing and Patient Assistance Program Information  We have supplied [employer] with any of your insurance information on file for billing purposes. [employer] offers a sliding scale Patient Assistance Program for patients that do not have  insurance, or whose insurance does not completely cover the cost of the ZIO monitor.  You must apply for the Patient Assistance Program to qualify for this discounted rate.  To apply, please call [employer] at [phone], select option 4, select option 2, ask to apply for  Patient Assistance Program. Sanna Crystal will ask your household income, and how many people  are in your household. They will quote your out-of-pocket cost based on that information.  [employer] will also be able to set up a 13-month, interest-free payment plan if needed.  Applying the monitor  Shave hair from upper left chest.  Hold abrader disc by orange tab. Rub abrader in 40 strokes over the upper left chest as  indicated in your monitor instructions.  Clean area with 4 enclosed alcohol pads. Let dry.  Apply patch as indicated in monitor instructions. Patch will be placed under collarbone on left  side of chest with arrow pointing upward.  Rub patch adhesive wings for 2 minutes.  Remove white label marked "1". Remove the white  label marked "2". Rub patch adhesive wings for 2 additional minutes.  While looking in a mirror, press and release button in center of patch. A small green light will  flash 3-4 times. This will be your only indicator that the monitor has been turned on.  Do not shower for the first 24 hours. You may shower after the first 24 hours.  Press the button if you feel a symptom. You will hear a small click. Record Date, Time and  Symptom in the Patient Logbook.  When you are ready to remove the patch, follow instructions on the last 2 pages of Patient  Logbook. Stick patch monitor onto the last page of Patient Logbook.  Place Patient Logbook in the blue and white box. Use locking tab on box and tape box closed  securely. The blue and white box has prepaid postage on it. Please place it in the mailbox as  soon as possible. Your physician should have your test results approximately 7 days after the  monitor has been mailed back to [facility].  Call [facility] Customer Care at [phone] if you have questions regarding  your ZIO XT patch monitor. Call them immediately if you see an orange light blinking on your  monitor.  If your monitor falls off in less than 4 days, contact our Monitor department at [phone].  If your monitor becomes loose or falls off after 4 days call [employer] at [phone] for  suggestions on securing your monitor   Follow-Up: At [facility], you and your health needs are our priority.  As part of our continuing mission to provide you with exceptional heart care, our providers are all part of one team.  This team includes your primary Cardiologist (physician) and Advanced Practice Providers or APPs (Physician Assistants and Nurse Practitioners) who all work together to provide you with the care you need, when you need it.  Your next appointment:   6 - 8 week(s)  Provider:   Morey Ar, NP     We recommend signing up for the patient portal called "MyChart".  Sign up information is provided on this After Visit Summary.  MyChart is used to connect with patients for Virtual Visits (Telemedicine).  Patients are able to view lab/test results, encounter notes, upcoming appointments, etc.  Non-urgent messages can be sent to your provider as well.   To learn more about what you can do with MyChart, go to [email or web address].
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
## 2024-03-26 ENCOUNTER — Emergency Department

## 2024-03-26 ENCOUNTER — Telehealth

## 2024-03-26 ENCOUNTER — Emergency Department
Admission: EM | Admit: 2024-03-26 | Discharge: 2024-03-26 | Discharge status: Against Medical Advice | Attending: Emergency Medicine | Admitting: Emergency Medicine

## 2024-03-26 DIAGNOSIS — R079 Chest pain, unspecified: Secondary | ICD-10-CM | POA: Diagnosis not present

## 2024-03-26 LAB — CBC
Hematocrit: 39.9 % (ref 34.0–46.6)
Hemoglobin: 12.6 g/dL (ref 11.1–15.9)
MCH: 26.4 pg — ABNORMAL LOW (ref 26.6–33.0)
MCHC: 31.6 g/dL (ref 31.5–35.7)
MCV: 84 fL (ref 79–97)
Platelets: 160 10*3/uL (ref 150–450)
RBC: 4.77 x10E6/uL (ref 3.77–5.28)
RDW: 13.3 % (ref 11.7–15.4)
WBC: 4.3 10*3/uL (ref 3.4–10.8)

## 2024-03-26 LAB — COMPREHENSIVE METABOLIC PANEL WITH GFR
ALT: 6 IU/L (ref 0–32)
AST: 12 IU/L (ref 0–40)
Albumin: 4.2 g/dL (ref 3.9–4.9)
Alkaline Phosphatase: 90 IU/L (ref 44–121)
BUN/Creatinine Ratio: 13 (ref 9–23)
BUN: 8 mg/dL (ref 6–20)
Bilirubin Total: <0.2 mg/dL (ref 0.0–1.2)
CO2: 25 mmol/L (ref 20–29)
Calcium: 9.5 mg/dL (ref 8.7–10.2)
Chloride: 103 mmol/L (ref 96–106)
Creatinine, Ser: 0.62 mg/dL (ref 0.57–1.00)
Globulin, Total: 2.5 g/dL (ref 1.5–4.5)
Glucose: 85 mg/dL (ref 70–99)
Potassium: 4.4 mmol/L (ref 3.5–5.2)
Sodium: 144 mmol/L (ref 134–144)
Total Protein: 6.7 g/dL (ref 6.0–8.5)
eGFR: 119 mL/min/{1.73_m2} (ref >59)

## 2024-03-26 LAB — LIPID PANEL
Chol/HDL Ratio: 3 ratio (ref 0.0–4.4)
Cholesterol, Total: 233 mg/dL — ABNORMAL HIGH (ref 100–199)
HDL: 78 mg/dL (ref >39)
LDL Chol Calc (NIH): 138 mg/dL — ABNORMAL HIGH (ref 0–99)
Triglycerides: 100 mg/dL (ref 0–149)
VLDL Cholesterol Cal: 17 mg/dL (ref 5–40)

## 2024-03-26 LAB — POC URINE PREG, ED: Preg Test, Ur: NEGATIVE

## 2024-03-26 LAB — LDL CHOLESTEROL, DIRECT: LDL Direct: 140 mg/dL — ABNORMAL HIGH (ref 0–99)

## 2024-03-27 MED ORDER — FUROSEMIDE 20 MG PO TABS: 20.0000 mg | ORAL_TABLET | Freq: Every day | ORAL | 3 refills | Status: AC

## 2024-03-27 MED ORDER — ROSUVASTATIN CALCIUM 40 MG PO TABS: 40.0000 mg | ORAL_TABLET | Freq: Every day | ORAL | 3 refills | Status: AC
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
## 2024-04-03 ENCOUNTER — Ambulatory Visit: Admitting: Dermatology

## 2024-05-06 ENCOUNTER — Other Ambulatory Visit

## 2024-05-14 NOTE — Progress Notes (Deleted)
 Cardiology Clinic Note   Date: 05/14/2024 ID: Andrea Miller, DOB 08/10/1986, MRN [id number]  Primary Cardiologist:  Constancia Delton, MD  Chief Complaint   Andrea Miller is a 38 y.o. female who presents to the clinic today for ***  Patient Profile   Andrea Miller is followed by *** for the history outlined below.       Past medical history significant for: Chronic HFpEF. Echo 09/16/2018: EF 55 to 60%.  Borderline LVH.  No RWMA.  Normal diastolic parameters.  Mild RVH.*** Echo 05/15/2024:*** Palpitations/SVT. 14-day ZIO*** Hypertension. Hyperlipidemia. Lipid panel 03/25/2024: LDL 140, HDL 78, TG 100, total 233. PE. OSA. Asthma. T2DM. Fibromyalgia. Lymphedema.  In summary, patient was first evaluated by Dr. Junnie Miller on 07/22/2021 to establish care for SVT.  She had previously been worked up for syncopal episode.  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  She had previously been followed by [facility] for cardiac care.  She reported lower extremity edema managed with Lasix .  She was pursuing bariatric surgery which was unfortunately delayed secondary to development of PE while on contraceptives.  Her last echo was in October 2019 and showed normal LV function as detailed above.  No medication changes were made at the time of her visit.  She was not seen in follow-up.  Patient contacted the office on 02/21/2024 with complaints of palpitations, shortness of breath, lower extremity edema.  She was instructed to present to the ED for further evaluation.   Patient was last seen in the office by me on 03/25/2024 for evaluation of increased palpitations and shortness of breath.  Patient reported chest pain, shortness of breath, palpitations, presyncope, fatigue that had been continuous for several years.  She reported being out of diltiazem  1 to 2 months.  She stated she felt palpitations were under better control with  lower dose of diltiazem  twice a day instead of a full dose once a day.  Chest pain described as sharp in the center of her chest and moving to the left with and without exertion lasting minutes to hours and coming and going sometimes over several days.  Pain sometimes occurred with palpitations and was relieved with the use of propranolol .  She reported experiencing heart racing at night and in the early morning as well as runs of SVT throughout the day.  She was managing chronic lower extremity edema with Lasix .  She reported when she has prolonged palpitation she cannot walk more than a few steps without feeling dyspneic and presyncopal.  Decision was made to defer ischemic evaluation in favor of evaluating palpitations and dyspnea.  14-day ZIO was ordered but does not appear to have been completed***.  Echo demonstrated***     History of Present Illness    Today, patient ***  Chronic HFpEF Echo 05/15/2024 demonstrated***.  Patient reports chronic lower extremity edema she manages with lymphedema pumps and Lasix . Volume is difficult to ascertain secondary to body habitus. She has trace edema bilaterally today.  Euvolemic and well compensated on exam.*** - Continue propranolol , Lasix .***   Palpitations/PSVT Patient reports increased palpitations with associated chest pain, shortness of breath, fatigue, and presyncope. She does not know how high her HR gets when she is having palpitations.  Patient*** - Continue propranolol , diltiazem . - Decrease diltiazem  to 180 mg bid.    Chest pain Patient reports chest pain with and without exertion and occurring mainly when she has palpitations. Pain is described as  sharp starting in the center of chest and radiating to the left. Pain is relieved with propranolol .*** -Will defer ischemic evaluation at this time.***   Hypertension BP today***. Continue diltiazem , propranolol .   Hyperlipidemia ***  ROS: All other systems reviewed and are otherwise  negative except as noted in History of Present Illness.  EKGs/Labs Reviewed        03/25/2024: ALT 6; AST 12; BUN 8; Creatinine, Ser 0.62; Potassium 4.4; Sodium 144   03/25/2024: Hemoglobin 12.6; WBC 4.3   No results found for requested labs within last 365 days.   No results found for requested labs within last 365 days.  ***  Risk Assessment/Calculations    {Does this patient have ATRIAL FIBRILLATION?:[phone]} No BP recorded.  {Refresh Note OR Click here to enter BP  :1}***        Physical Exam    VS:  There were no vitals taken for this visit. , BMI There is no height or weight on file to calculate BMI.  GEN: Well nourished, well developed, in no acute distress. Neck: No JVD or carotid bruits. Cardiac: *** RRR. *** No murmur. No rubs or gallops.   Respiratory:  Respirations regular and unlabored. Clear to auscultation without rales, wheezing or rhonchi. GI: Soft, nontender, nondistended. Extremities: Radials/DP/PT 2+ and equal bilaterally. No clubbing or cyanosis. No edema ***  Skin: Warm and dry, no rash. Neuro: Strength intact.  Assessment & Plan   ***  Disposition: ***     {Are you ordering a CV Procedure (e.g. stress test, cath, DCCV, TEE, etc)?   Press F2        :[id number]}   Signed, Lonell Rives. Mishka Stegemann, DNP, NP-C

## 2024-05-15 ENCOUNTER — Ambulatory Visit

## 2024-05-19 ENCOUNTER — Ambulatory Visit: Attending: Student | Admitting: Student

## 2024-05-27 ENCOUNTER — Ambulatory Visit: Attending: Student

## 2024-06-19 ENCOUNTER — Ambulatory Visit: Attending: Student

## 2024-07-31 ENCOUNTER — Other Ambulatory Visit: Payer: Self-pay | Admitting: Nurse Practitioner

## 2024-07-31 DIAGNOSIS — M545 Low back pain, unspecified: Secondary | ICD-10-CM

## 2024-07-31 DIAGNOSIS — M25512 Pain in left shoulder: Secondary | ICD-10-CM

## 2024-07-31 DIAGNOSIS — M25571 Pain in right ankle and joints of right foot: Secondary | ICD-10-CM

## 2024-08-02 ENCOUNTER — Encounter: Payer: Self-pay | Admitting: Oncology

## 2024-08-02 ENCOUNTER — Ambulatory Visit
Admission: RE | Admit: 2024-08-02 | Discharge: 2024-08-02 | Disposition: A | Discharge status: Home / Self Care | Source: Ambulatory Visit | Attending: Nurse Practitioner | Admitting: Nurse Practitioner

## 2024-08-02 DIAGNOSIS — M545 Low back pain, unspecified: Secondary | ICD-10-CM

## 2024-08-02 DIAGNOSIS — M25512 Pain in left shoulder: Secondary | ICD-10-CM

## 2024-08-02 DIAGNOSIS — M25571 Pain in right ankle and joints of right foot: Secondary | ICD-10-CM

## 2024-08-11 ENCOUNTER — Other Ambulatory Visit: Payer: Self-pay | Admitting: Nurse Practitioner

## 2024-08-11 DIAGNOSIS — M25512 Pain in left shoulder: Secondary | ICD-10-CM

## 2024-08-12 ENCOUNTER — Encounter: Payer: Self-pay | Admitting: Oncology

## 2024-08-19 ENCOUNTER — Encounter: Payer: Self-pay | Admitting: Oncology

## 2024-08-19 ENCOUNTER — Inpatient Hospital Stay: Payer: Medicare HMO | Attending: Oncology

## 2024-08-19 ENCOUNTER — Inpatient Hospital Stay: Payer: Medicare HMO | Admitting: Oncology

## 2024-09-02 NOTE — Progress Notes (Signed)
 Assessment & Plan:  1. Asthma without status asthmaticus without complication, unspecified asthma severity, unspecified whether persistent (Primary) - Ambulatory Referral for DME  2. Heart palpitations - Ambulatory referral to Cardiology   Patient Instructions  1.  We are setting you up with new cardiologist to continue follow-up on your palpitations.  2.  We will schedule breathing tests as you have not had them since 2018 this will be to evaluate your increasing shortness of breath.  3.  You have had refills on your Symbicort , montelukast , albuterol  (nebulizer and inhaler).  He also have received spacer for use with your inhalers.  4.  Continue Xarelto  as directed by Dr. Rudell.  As we discussed initially it looks like you had a high lupus anticoagulant however, follow-up test showed that this was not the case.  It appears that your blood clot occurred because of increased weight use of birth control pills.  This can be a common issue.  Continue Xarelto  as directed.  5.  We will schedule you for our sleep clinic to continue to follow  your sleep apnea.  6.  We will see you back in 3 months time.  Please note: late entry documentation due to logistical difficulties during COVID-19 pandemic. This note is filed for information purposes only, and is not intended to be used for billing, nor does it represent the full scope/nature of the visit in question. Please see any associated scanned media linked to date of encounter for additional pertinent information.  Subjective:    HPI: Andrea Miller is a 38 y.o. female presenting to the pulmonology clinic on 09/23/2019 with report of: Follow-up (F/u for Asthma. She reports increased SOB over last couple months. Reports deep cough and increased wheezing. Using symbicort  daily and using nebulizer rarely. Needs new nebulizer machine. )     Outpatient Encounter Medications as of 09/23/2019  Medication Sig   albuterol  (PROVENTIL ) (2.5  MG/3ML) 0.083% nebulizer solution Take 3 mLs (2.5 mg total) by nebulization every 4 (four) hours as needed for wheezing or shortness of breath.   Calcium -Magnesium  500-250 MG TABS Take 1 tablet by mouth 2 (two) times daily.    lactulose  (CHRONULAC ) 10 GM/15ML solution Take 30 g by mouth daily as needed for mild constipation.   linaclotide  (LINZESS ) 290 MCG CAPS capsule Take 290 mcg by mouth daily before breakfast.   Topiramate  ER (TROKENDI  XR) 200 MG CP24 Take 1 capsule by mouth daily.    [DISCONTINUED] albuterol  (PROVENTIL ) (2.5 MG/3ML) 0.083% nebulizer solution Take 2.5 mg by nebulization every 4 (four) hours as needed for wheezing or shortness of breath.   [DISCONTINUED] budesonide -formoterol  (SYMBICORT ) 160-4.5 MCG/ACT inhaler Inhale 2 puffs into the lungs 2 (two) times daily.   [DISCONTINUED] budesonide -formoterol  (SYMBICORT ) 160-4.5 MCG/ACT inhaler Inhale 2 puffs into the lungs 2 (two) times daily.   [DISCONTINUED] clonazePAM  (KLONOPIN ) 1 MG tablet Take 1 tablet by mouth 2 (two) times a day.   [DISCONTINUED] clonazePAM  (KLONOPIN ) 2 MG tablet Take 2 mg by mouth 3 (three) times daily.    [DISCONTINUED] cyanocobalamin  (,VITAMIN B-12,) 1000 MCG/ML injection Inject 1,000 mcg into the muscle every 30 (thirty) days.   [DISCONTINUED] Dexlansoprazole 30 MG capsule Take 30 mg by mouth daily.   [DISCONTINUED] diclofenac  sodium (VOLTAREN ) 1 % GEL Apply 4 g topically daily as needed (for pain).   [DISCONTINUED] diltiazem  (CARDIZEM  CD) 360 MG 24 hr capsule Take 360 mg by mouth every morning.   [DISCONTINUED] escitalopram  (LEXAPRO ) 20 MG tablet Take 20 mg by  mouth daily.   [DISCONTINUED] fluticasone  (FLONASE ) 50 MCG/ACT nasal spray Place 2 sprays into both nostrils daily as needed for allergies.    [DISCONTINUED] folic acid  (FOLVITE ) 1 MG tablet Take 1 mg by mouth daily.    [DISCONTINUED] furosemide  (LASIX ) 20 MG tablet Take 40 mg by mouth daily.   [DISCONTINUED] gabapentin  (NEURONTIN ) 400 MG capsule Take  400 mg by mouth 3 (three) times daily.   [DISCONTINUED] HYDROcodone -acetaminophen  (NORCO/VICODIN) 5-325 MG tablet Take 1 tablet by mouth every 6 (six) hours as needed for moderate pain.   [DISCONTINUED] hydroxypropyl methylcellulose / hypromellose (ISOPTO TEARS / GONIOVISC) 2.5 % ophthalmic solution Place 1 drop into both eyes 3 (three) times daily as needed for dry eyes.   [DISCONTINUED] ibuprofen  (ADVIL ,MOTRIN ) 600 MG tablet Take 600 mg by mouth every 8 (eight) hours as needed for mild pain or moderate pain.    [DISCONTINUED] Iron -Vitamin C  65-125 MG TABS Take 1 tablet by mouth 2 (two) times daily.   [DISCONTINUED] ketoconazole  (NIZORAL ) 2 % shampoo Apply 1 application topically every 30 (thirty) days.   [DISCONTINUED] lamoTRIgine  (LAMICTAL ) 100 MG tablet Take 200 mg by mouth 2 (two) times daily.    [DISCONTINUED] lamoTRIgine  (LAMICTAL ) 200 MG tablet Take 1 tablet by mouth 2 (two) times a day.   [DISCONTINUED] magic mouthwash w/lidocaine  SOLN Take 5 mLs by mouth 4 (four) times daily.   [DISCONTINUED] montelukast  (SINGULAIR ) 10 MG tablet Take 1 tablet (10 mg total) by mouth at bedtime.   [DISCONTINUED] montelukast  (SINGULAIR ) 10 MG tablet Take 1 tablet (10 mg total) by mouth at bedtime.   [DISCONTINUED] naproxen  (NAPROSYN ) 500 MG tablet Take 1 tablet (500 mg total) by mouth 2 (two) times daily with a meal.   [DISCONTINUED] Potassium Chloride  ER 20 MEQ TBCR Take 20 mEq by mouth 2 (two) times daily.  (Patient not taking: Reported on 04/14/2022)   [DISCONTINUED] propranolol  (INDERAL ) 40 MG tablet Take 2 tablets (80 mg total) by mouth 3 (three) times daily. (Patient not taking: Reported on 07/22/2021)   [DISCONTINUED] risperiDONE  (RISPERDAL ) 3 MG tablet Take 3 mg by mouth at bedtime.    [DISCONTINUED] rivaroxaban  (XARELTO ) 20 MG TABS tablet Take 20 mg by mouth every morning.   [DISCONTINUED] rivaroxaban  (XARELTO ) 20 MG TABS tablet Take 1 tablet (20 mg total) by mouth daily with supper.   [DISCONTINUED]  rosuvastatin  (CRESTOR ) 20 MG tablet Take 20 mg by mouth daily.   [DISCONTINUED] tiZANidine  (ZANAFLEX ) 4 MG tablet Take 4 mg by mouth 3 (three) times daily.    [DISCONTINUED] vitamin C  (VITAMIN C ) 250 MG tablet Take 1 tablet (250 mg total) by mouth 2 (two) times daily.   [DISCONTINUED] Albuterol  Sulfate 108 (90 Base) MCG/ACT AEPB Inhale 2 puffs into the lungs every 6 (six) hours as needed.   No facility-administered encounter medications on file as of 09/23/2019.      Objective:   Vitals:   09/23/19 1146  BP: 124/88  Pulse: 80  Temp: 98.2 F (36.8 C)  Height: 5' 10 (1.778 m)  Weight: (!) 357 lb 12.8 oz (162.3 kg)  SpO2: 97%  TempSrc: Temporal  BMI (Calculated): 51.34     Physical exam documentation is limited by delayed entry of information.

## 2024-09-07 ENCOUNTER — Telehealth: Admitting: Family

## 2024-09-07 ENCOUNTER — Telehealth

## 2024-09-07 DIAGNOSIS — R197 Diarrhea, unspecified: Secondary | ICD-10-CM

## 2024-09-07 DIAGNOSIS — J069 Acute upper respiratory infection, unspecified: Secondary | ICD-10-CM

## 2024-09-07 DIAGNOSIS — Z20822 Contact with and (suspected) exposure to covid-19: Secondary | ICD-10-CM

## 2024-09-07 DIAGNOSIS — J45909 Unspecified asthma, uncomplicated: Secondary | ICD-10-CM | POA: Diagnosis not present

## 2024-09-07 MED ORDER — METHYLPREDNISOLONE 4 MG PO TBPK: ORAL_TABLET | ORAL | 0 refills | Status: AC

## 2024-09-07 MED ORDER — BENZONATATE 100 MG PO CAPS: 100.0000 mg | ORAL_CAPSULE | Freq: Three times a day (TID) | ORAL | 0 refills | Status: AC | PRN

## 2024-09-07 MED ORDER — ALBUTEROL SULFATE HFA 108 (90 BASE) MCG/ACT IN AERS: 2.0000 | INHALATION_SPRAY | Freq: Four times a day (QID) | RESPIRATORY_TRACT | 0 refills | Status: AC | PRN

## 2024-09-07 MED ORDER — PROMETHAZINE-DM 6.25-15 MG/5ML PO SYRP: 5.0000 mL | ORAL_SOLUTION | Freq: Three times a day (TID) | ORAL | 0 refills | Status: AC | PRN

## 2024-09-07 MED ORDER — ONDANSETRON HCL 4 MG PO TABS: 4.0000 mg | ORAL_TABLET | Freq: Three times a day (TID) | ORAL | 0 refills | Status: AC | PRN

## 2024-09-07 NOTE — Progress Notes (Unsigned)
No showed for appointment

## 2024-09-07 NOTE — Progress Notes (Signed)
 Virtual Visit Consent   Andrea Miller, you are scheduled for a virtual visit with a [facility] provider today. Just as with appointments in the office, your consent must be obtained to participate. Your consent will be active for this visit and any virtual visit you may have with one of our providers in the next 365 days. If you have a MyChart account, a copy of this consent can be sent to you electronically.  As this is a virtual visit, video technology does not allow for your provider to perform a traditional examination. This may limit your provider's ability to fully assess your condition. If your provider identifies any concerns that need to be evaluated in person or the need to arrange testing (such as labs, EKG, etc.), we will make arrangements to do so. Although advances in technology are sophisticated, we cannot ensure that it will always work on either your end or our end. If the connection with a video visit is poor, the visit may have to be switched to a telephone visit. With either a video or telephone visit, we are not always able to ensure that we have a secure connection.  By engaging in this virtual visit, you consent to the provision of healthcare and authorize for your insurance to be billed (if applicable) for the services provided during this visit. Depending on your insurance coverage, you may receive a charge related to this service.  I need to obtain your verbal consent now. Are you willing to proceed with your visit today? Andrea Miller has provided verbal consent on 09/07/2024 for a virtual visit (video or telephone). Bari Learn, FNP  Date: 09/07/2024 7:32 PM   Virtual Visit via Video Note   I, Bari Learn, connected with  Andrea Miller  ([id number], 06-09-1986) on 09/07/24 at  7:30 PM EDT by a video-enabled telemedicine application and verified that I am speaking with the correct person using two identifiers.  Location: Patient: Virtual Visit Location  Patient: Home Provider: Virtual Visit Location Provider: Home Office   I discussed the limitations of evaluation and management by telemedicine and the availability of in person appointments. The patient expressed understanding and agreed to proceed.    History of Present Illness: Andrea Miller is a 38 y.o. who identifies as a female who was assigned female at birth, and is being seen today for URI symptoms that started  two days ago. She is taking care of her niece who was diagnosed with COVID.   HPI: URI  This is a new problem. The current episode started in the past 7 days. The problem has been unchanged. Maximum temperature: low grade. Associated symptoms include congestion, coughing, headaches, joint pain, a sore throat and wheezing. Pertinent negatives include no ear pain, rhinorrhea or sinus pain. Associated symptoms comments: diarrhea. Treatments tried: imoidum. The treatment provided mild relief.    Problems:  Patient Active Problem List   Diagnosis Date Noted   Andrea Miller 10/27/2020   Generalized anxiety disorder with panic attacks 10/27/2020   Heart failure with preserved ejection fraction (HCC) 10/27/2020   History of pulmonary embolism 10/27/2020   Low back pain 12/31/2019   Chronic anticoagulation 05/07/2019   Iron  deficiency anemia 10/15/2018   B12 deficiency 10/13/2018   Symptomatic anemia 10/11/2018   Multiple subsegmental pulmonary emboli without acute cor pulmonale (HCC) 09/15/2018   Morbid obesity with BMI of 50.0-59.9, adult (HCC) 09/15/2018   Chronic diastolic heart failure (HCC) 07/01/2018   Lymphedema 07/01/2018   Chronic  cholecystitis    Edema 02/13/2018   Diabetes mellitus type 2, uncomplicated (HCC) 02/13/2018   Hypertension 11/23/2017   Asthma exacerbation 04/14/2017   Asthma without status asthmaticus 01/16/2017   Bipolar affective (HCC) 01/16/2017   Coronary artery disease 01/16/2017   Syncope 12/12/2016   Fibromyalgia 04/27/2014   OSA  (obstructive sleep apnea) 04/27/2014   Seizure-like activity (HCC) 04/27/2014   Migraine without status migrainosus, not intractable 04/27/2014   Obesity 04/15/2014   Sleep disorder 04/15/2014   Neck pain 04/15/2014   Headache 04/15/2014   Restless legs syndrome 04/15/2014   Supraventricular tachycardia 07/13/2009    Allergies:  Allergies  Allergen Reactions   Contrast Media [Iodinated Contrast Media] Shortness Of Breath    Pt reports that every time she receives contrast she can't breathe for a short time. At time of arrival to room after CT scan, she is breathing normally and VS WNL.  Pt states that she thought not breathing after contrast for a little while was normal. When asked if allergic or has any reaction to IV contrast patient stated no. LP    Garlic Anaphylaxis   Prednisone  Swelling    Tongue swelling   Amoxicillin  Rash   Corticosteroids Palpitations   Penicillins Other (See Comments) and Rash    Has patient had a PCN reaction causing immediate rash, facial/tongue/throat swelling, SOB or lightheadedness with hypotension:Yes  Has patient had a PCN reaction causing severe rash involving mucus membranes or skin necrosis:No  Has patient had a PCN reaction that required hospitalization:No  Has patient had a PCN reaction occurring within the last 10 years:Yes.  If all of the above answers are NO, then may proceed with Cephalosporin use.  Patient does not remember  Has patient had a PCN reaction causing immediate rash, facial/tongue/throat swelling, SOB or lightheadedness with hypotension:Yes, Has patient had a PCN reaction causing severe rash involving mucus membranes or skin necrosis:No, Has patient had a PCN reaction that required hospitalization:No, Has patient had a PCN reaction occurring within the last 10 years:Yes., If all of the above answers are NO, then may proceed with Cephalosporin use., , , Patient does not remember, Patient does not remember    Medications:  Current Outpatient Medications:    albuterol  (VENTOLIN  HFA) 108 (90 Base) MCG/ACT inhaler, Inhale 2 puffs into the lungs every 6 (six) hours as needed for wheezing or shortness of breath., Disp: 8 g, Rfl: 0   benzonatate  (TESSALON  PERLES) 100 MG capsule, Take 1 capsule (100 mg total) by mouth 3 (three) times daily as needed., Disp: 20 capsule, Rfl: 0   methylPREDNISolone  (MEDROL  DOSEPAK) 4 MG TBPK tablet, Use as directed, Disp: 21 tablet, Rfl: 0   ondansetron  (ZOFRAN ) 4 MG tablet, Take 1 tablet (4 mg total) by mouth every 8 (eight) hours as needed for nausea or vomiting., Disp: 20 tablet, Rfl: 0   promethazine -dextromethorphan (PROMETHAZINE -DM) 6.25-15 MG/5ML syrup, Take 5 mLs by mouth 3 (three) times daily as needed for cough., Disp: 118 mL, Rfl: 0   Adapalene  0.3 % gel, APPLY 1 APPLICATION. TOPICALLY AT BEDTIME., Disp: 45 g, Rfl: 0   albuterol  (PROVENTIL ) (2.5 MG/3ML) 0.083% nebulizer solution, Take 3 mLs (2.5 mg total) by nebulization every 4 (four) hours as needed for wheezing or shortness of breath., Disp: 360 mL, Rfl: 0   Albuterol  Sulfate (PROAIR  RESPICLICK) 108 (90 Base) MCG/ACT AEPB, Inhale 2 puffs into the lungs every 6 (six) hours as needed., Disp: 1 each, Rfl: 0   amphetamine -dextroamphetamine  (ADDERALL XR) 15 MG 24  hr capsule, Patient says she is taking 25 mg daily now, Disp: , Rfl:    ARIPiprazole (ABILIFY) 20 MG tablet, Take 1 tablet by mouth daily., Disp: , Rfl:    budesonide -formoterol  (SYMBICORT ) 160-4.5 MCG/ACT inhaler, Inhale 2 puffs into the lungs in the morning and at bedtime., Disp: 1 each, Rfl: 6   busPIRone (BUSPAR) 15 MG tablet, , Disp: , Rfl:    Calcium -Magnesium  500-250 MG TABS, Take 1 tablet by mouth 2 (two) times daily. , Disp: , Rfl:    cyanocobalamin  (VITAMIN B12) 1000 MCG/ML injection, INJECT 1 ML (1,000 MCG TOTAL) INTO THE MUSCLE EVERY 30 (THIRTY) DAYS FOR 12 DOSES., Disp: 3 mL, Rfl: 3   diltiazem  (CARDIZEM  CD) 180 MG 24 hr capsule, Take 2 capsules  (360 mg total) by mouth every morning., Disp: 180 capsule, Rfl: 3   fluticasone  (FLONASE ) 50 MCG/ACT nasal spray, Place 2 sprays into both nostrils daily as needed for allergies., Disp: 16 g, Rfl: 1   folic acid  (FOLVITE ) 1 MG tablet, Take 1 mg by mouth daily., Disp: , Rfl:    furosemide  (LASIX ) 20 MG tablet, Take 1 tablet (20 mg total) by mouth daily for 360 doses. May take an extra tablet ( 20 MG) daily as needed., Disp: 120 tablet, Rfl: 3   gabapentin  (NEURONTIN ) 800 MG tablet, Take 800 mg by mouth 3 (three) times daily., Disp: , Rfl:    hydrocortisone  2.5 % cream, APPLY TO ECZEMA ON FACE 1-2 TIMES A DAY AS NEEDED UNTIL RASH IMPROVED., Disp: 28 g, Rfl: 0   ipratropium (ATROVENT ) 0.06 % nasal spray, Place 2 sprays into both nostrils 4 (four) times daily., Disp: 15 mL, Rfl: 12   ketoconazole  (NIZORAL ) 2 % cream, APPLY 1-2 TIMES A DAY TO ITCHY RASH FACE, SCALP, UNDER BREAST UNTIL CLEAR, THEN AS NEEDED FOR FLARES, Disp: 60 g, Rfl: 0   [employer] PEN NEEDLES 31G X 6 MM MISC, SMARTSIG:pre-filled pen syringe injection Daily, Disp: , Rfl:    lactulose  (CHRONULAC ) 10 GM/15ML solution, Take 30 g by mouth daily as needed for mild constipation., Disp: , Rfl: 5   linaclotide  (LINZESS ) 290 MCG CAPS capsule, Take 290 mcg by mouth daily before breakfast., Disp: , Rfl:    LORazepam  (ATIVAN ) 1 MG tablet, Take 1 tablet by mouth 2 (two) times daily., Disp: , Rfl:    Lurasidone HCl (LATUDA) 60 MG TABS, Take 1 tablet by mouth in the morning., Disp: , Rfl:    meclizine  (ANTIVERT ) 25 MG tablet, , Disp: , Rfl:    medroxyPROGESTERone (PROVERA) 5 MG tablet, Take 5 mg by mouth daily., Disp: , Rfl:    mometasone  (ELOCON ) 0.1 % cream, APPLY TO MORE SEVERE AREAS ECZEMA 1-2 TIMES AS DIRECTED. MAY USE ON FACE UP TO 1 WEEK AT A TIME. AVOID GROIN, UNDERARMS., Disp: 45 g, Rfl: 3   montelukast  (SINGULAIR ) 10 MG tablet, Take 1 tablet (10 mg total) by mouth at bedtime., Disp: 90 tablet, Rfl: 1   olopatadine (PATANOL) 0.1 % ophthalmic  solution, 1 drop 2 (two) times daily., Disp: , Rfl:    ondansetron  (ZOFRAN -ODT) 8 MG disintegrating tablet, Take 1 tablet (8 mg total) by mouth every 8 (eight) hours as needed for nausea or vomiting., Disp: 20 tablet, Rfl: 0   OZEMPIC, 2 MG/DOSE, 8 MG/3ML SOPN, , Disp: , Rfl:    pimecrolimus  (ELIDEL ) 1 % cream, APPLY TOPICALLY TO AREAS ON FACE, BODY, BODY FOLDS., Disp: 100 g, Rfl: 0   potassium chloride  (KLOR-CON  M) 10 MEQ tablet,  Take 1 tablet (10 mEq total) by mouth 2 (two) times daily., Disp: 180 tablet, Rfl: 3   Potassium Chloride  ER 20 MEQ TBCR, Take 20 mEq by mouth 2 (two) times daily., Disp: 180 tablet, Rfl: 3   prazosin (MINIPRESS) 2 MG capsule, Take 4 mg by mouth at bedtime., Disp: , Rfl:    propranolol  (INDERAL ) 80 MG tablet, Take 1 tablet (80 mg total) by mouth 3 (three) times daily., Disp: 270 tablet, Rfl: 3   rivaroxaban  (XARELTO ) 20 MG TABS tablet, TAKE 1 TABLET BY MOUTH DAILY WITH SUPPER., Disp: 90 tablet, Rfl: 0   rosuvastatin  (CRESTOR ) 40 MG tablet, Take 1 tablet (40 mg total) by mouth daily., Disp: 90 tablet, Rfl: 3   SPIRIVA  RESPIMAT 1.25 MCG/ACT AERS, INHALE 2 PUFFS INTO THE LUNGS DAILY., Disp: 4 g, Rfl: 0   SYRINGE-NEEDLE, DISP, 3 ML (B-D 3CC LUER-LOK SYR 25GX1) 25G X 1 3 ML MISC, USE THE NEEDLE FOR M INJECTION ONCE A MONTH., Disp: 6 each, Rfl: 1   Topiramate  ER (TROKENDI  XR) 200 MG CP24, Take 1 capsule by mouth daily. , Disp: , Rfl:    tretinoin  (RETIN-A ) 0.025 % cream, FOR ACNE APPLY TO FACE NIGHTLY AS TOLERATED., Disp: 45 g, Rfl: 0   venlafaxine XR (EFFEXOR-XR) 150 MG 24 hr capsule, Take 150 mg by mouth daily., Disp: , Rfl:    VICTOZA 18 MG/3ML SOPN, Inject 1.2 mg into the skin daily., Disp: , Rfl:  No current facility-administered medications for this visit.  Facility-Administered Medications Ordered in Other Visits:    iron  sucrose (VENOFER ) injection 200 mg, 200 mg, Intravenous, Once, Andrea Annah BROCKS, MD  Observations/Objective: Patient is well-developed,  well-nourished in no acute distress.  Resting comfortably  at home.  Head is normocephalic, atraumatic.  No labored breathing.  Speech is clear and coherent with logical content.  Patient is alert and oriented at baseline.  Dry nonproductive cough  Assessment and Plan: 1. Exposure to COVID-19 virus (Primary)  2. Upper respiratory tract infection, unspecified type - ondansetron  (ZOFRAN ) 4 MG tablet; Take 1 tablet (4 mg total) by mouth every 8 (eight) hours as needed for nausea or vomiting.  Dispense: 20 tablet; Refill: 0 - promethazine -dextromethorphan (PROMETHAZINE -DM) 6.25-15 MG/5ML syrup; Take 5 mLs by mouth 3 (three) times daily as needed for cough.  Dispense: 118 mL; Refill: 0 - benzonatate  (TESSALON  PERLES) 100 MG capsule; Take 1 capsule (100 mg total) by mouth 3 (three) times daily as needed.  Dispense: 20 capsule; Refill: 0  3. Diarrhea, unspecified type  4. Asthma without status asthmaticus without complication, unspecified asthma severity, unspecified whether persistent - albuterol  (VENTOLIN  HFA) 108 (90 Base) MCG/ACT inhaler; Inhale 2 puffs into the lungs every 6 (six) hours as needed for wheezing or shortness of breath.  Dispense: 8 g; Refill: 0 - methylPREDNISolone  (MEDROL  DOSEPAK) 4 MG TBPK tablet; Use as directed  Dispense: 21 tablet; Refill: 0  Rest, force fluids, tylenol  as needed,  report any worsening symptoms such as increased shortness of breath, swelling, or continued high fevers. Continue imodium, zofran  prn.    Follow Up Instructions: I discussed the assessment and treatment plan with the patient. The patient was provided an opportunity to ask questions and all were answered. The patient agreed with the plan and demonstrated an understanding of the instructions.  A copy of instructions were sent to the patient via MyChart unless otherwise noted below.     The patient was advised to call back or seek an in-person evaluation if the  symptoms worsen or if the  condition fails to improve as anticipated.    Bari Learn, FNP

## 2024-09-07 NOTE — Patient Instructions (Signed)
 COVID-19: What to Know COVID-19 is an infection caused by a virus called SARS-CoV-2. This type of virus is called a coronavirus. People with COVID-19 may: Have few to no symptoms. Have mild to moderate symptoms that affect their lungs and breathing. Get very sick. What are the causes?  COVID-19 is caused by a virus. This virus may be in the air as droplets or on surfaces. It can spread from an infected person when they cough, sneeze, speak, sing, or breathe. You may become infected if: You breathe in the infected droplets in the air. You touch an object that has the virus on it. What increases the risk? You are at risk of getting COVID-19 if you have been around someone with the infection. You may be more likely to get very sick if: You are 36 years old or older. You have certain medical conditions, such as: Heart disease. Diabetes. Long-term respiratory disease. Cancer. Pregnancy. You are immunocompromised. This means your body can't fight infections easily. You have a disability that makes it hard for you to move around, you have trouble moving, or you can't move at all. What are the signs or symptoms? People may have different symptoms from COVID-19. The symptoms can also be mild to very bad. They often show up in 5-6 days after being infected. But, they can take up to 14 days to appear. Common symptoms are: Cough. Feeling tired. New loss of taste or smell. Fever. Less common symptoms are: Sore throat. Headache. Body or muscle aches. Diarrhea. A skin rash or fingers or toes that are a different color than usual. Red or irritated eyes. Sometimes, COVID-19 does not cause symptoms. How is this diagnosed? COVID-19 can be diagnosed with tests done in the lab or at home. Fluid from your nose, mouth, or lungs will be used to check for the virus. How is this treated? Treatment for COVID-19 depends on how sick you are. Mild symptoms can be treated at home with rest, fluids, and  over-the-counter medicines. very bad symptoms may be treated in a hospital intensive care unit (ICU). If you have symptoms and are at risk of getting very sick, you may be given a medicine that fights viruses. This medicine is called an antiviral. How is this prevented? To protect yourself from COVID-19: Know your risk factors. Get vaccinated. If your body can't fight infections easily, talk to your provider about treatment to help prevent COVID-19. Stay at least about 3 feet (1 meter) away from other people. Wear mask that fits well when: You can't stay at a distance from people. You're in a place with not a lot of air flow. Try to be in open spaces with good air flow when you are in public. Wash your hands often or use an alcohol-based hand sanitizer. Cover your nose and mouth when you cough or sneeze. If you think you have COVID-19 or have been around someone who has it, stay home and away from other people as told by your provider or health officials. Where to find more information To learn more: Go to [employer] Click Health Topics. Type COVID-19 in the search box. Go to [employer].br Click Health Topics. Then click All Topics. Type COVID-19 in the search box. Get help right away if: You have trouble breathing or get short of breath. You have pain or pressure in your chest. You're feeling confused. These symptoms may be an emergency. Get help right away. Call 911. Do not wait to see if the symptoms will go away.  Do not drive yourself to the hospital. This information is not intended to replace advice given to you by your health care provider. Make sure you discuss any questions you have with your health care provider. Document Revised: 08/16/2023 Document Reviewed: 08/08/2023 Elsevier Patient Education  2025 [employer].

## 2024-09-09 ENCOUNTER — Telehealth: Admitting: Family Medicine

## 2024-09-09 DIAGNOSIS — Z20822 Contact with and (suspected) exposure to covid-19: Secondary | ICD-10-CM

## 2024-09-09 DIAGNOSIS — R197 Diarrhea, unspecified: Secondary | ICD-10-CM

## 2024-09-09 DIAGNOSIS — J069 Acute upper respiratory infection, unspecified: Secondary | ICD-10-CM

## 2024-09-09 NOTE — Progress Notes (Signed)
 [place]   On going symptoms- not improving after 2 days of treatment from our group. Advised she needs in person follow up since she does not feel like she is improving.   Patient acknowledged agreement and understanding of the plan.

## 2024-09-17 ENCOUNTER — Encounter: Payer: Self-pay | Admitting: Student

## 2024-09-23 ENCOUNTER — Other Ambulatory Visit

## 2024-10-01 ENCOUNTER — Telehealth

## 2024-10-02 ENCOUNTER — Telehealth

## 2024-10-06 ENCOUNTER — Telehealth: Payer: Self-pay | Admitting: Oncology

## 2024-10-06 NOTE — Telephone Encounter (Signed)
 Received secure chat saying that pt wanted to r/s appt - called pt to r/s, but no answer - no vm set up - [facility]

## 2024-11-04 ENCOUNTER — Encounter: Payer: Self-pay | Admitting: Cardiology

## 2024-11-04 NOTE — Telephone Encounter (Signed)
 Called patient to schedule office visit - unable to leave message

## 2024-11-12 ENCOUNTER — Telehealth: Payer: Self-pay | Admitting: Oncology

## 2024-11-12 ENCOUNTER — Other Ambulatory Visit: Payer: Self-pay

## 2024-11-12 DIAGNOSIS — D500 Iron deficiency anemia secondary to blood loss (chronic): Secondary | ICD-10-CM

## 2024-11-12 DIAGNOSIS — E538 Deficiency of other specified B group vitamins: Secondary | ICD-10-CM

## 2024-11-12 NOTE — Telephone Encounter (Signed)
 Pt called to reschedule an appt for labs and to see Dr. Melanee. I called back and we rescheduled for 12/16/2023 at 11:15. She confirmed this time would work for her.

## 2024-11-14 NOTE — Progress Notes (Deleted)
 "  Cardiology Clinic Note   Date: 11/14/2024 ID: Andrea Miller, DOB 11/29/1985, MRN [id number]  Primary Cardiologist:  Redell Cave, MD  Chief Complaint   Andrea Miller is a 38 y.o. female who presents to the clinic today for ***  Patient Profile   Andrea Miller is followed by *** for the history outlined below.       Past medical history significant for: Chronic HFpEF. Echo 09/16/2018: EF 55 to 60%.  Borderline LVH.  No RWMA.  Normal diastolic parameters.  Mild RVH. Palpitations/SVT. Hypertension. Hyperlipidemia. Lipid panel 03/25/2024: LDL 138, HDL 78, TG 100, total 233. PE. OSA. Asthma. T2DM. Fibromyalgia. Lymphedema.  In summary, patient was first evaluated by Dr. Cave on 07/22/2021 to establish care for SVT.  She had previously been worked up for syncopal episode.  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  She had previously been followed by [facility] for cardiac care.  She reported lower extremity edema managed with Lasix .  She was pursuing bariatric surgery which was unfortunately delayed secondary to development of PE while on contraceptives.  Her last echo was in October 2019 and showed normal LV function as detailed above.  No medication changes were made at the time of her visit.  She was not seen in follow-up.  Patient contacted the office on 02/21/2024 with complaints of palpitations, shortness of breath, lower extremity edema.  She was instructed to present to the ED for further evaluation.   Patient was last seen in the office by me on 03/25/2024 for evaluation of increased palpitations and shortness of breath.  Patient reported chest pain, shortness of breath, palpitations, presyncope, fatigue that had been continuous for several years.  She reported being out of diltiazem  1 to 2 months.  She stated she felt palpitations were under better control with lower dose of diltiazem  twice a day  instead of a full dose once a day.  Chest pain described as sharp in the center of her chest and moving to the left with and without exertion lasting minutes to hours and coming and going sometimes over several days.  Pain sometimes occurred with palpitations and was relieved with the use of propranolol .  She reported experiencing heart racing at night and in the early morning as well as runs of SVT throughout the day.  She was managing chronic lower extremity edema with Lasix .  She reported when she has prolonged palpitation she cannot walk more than a few steps without feeling dyspneic and presyncopal.  Decision was made to defer ischemic evaluation in favor of evaluating palpitations and dyspnea.  14-day ZIO and echo were ordered but does not appear to have been completed***.       History of Present Illness    Today, patient ***  Chronic HFpEF Echo October 2019 demonstrated EF 55 to 60%, borderline LVH, normal diastolic parameters, mild RVH.  Patient reports chronic lower extremity edema she manages with lymphedema pumps and Lasix . Volume is difficult to ascertain secondary to body habitus. She has trace edema bilaterally today.  Euvolemic and well compensated on exam.*** - Continue propranolol , Lasix .***   Palpitations/PSVT Patient reports increased palpitations with associated chest pain, shortness of breath, fatigue, and presyncope. She does not know how high her HR gets when she is having palpitations.  Patient*** - Continue propranolol , diltiazem . - Decrease diltiazem  to 180 mg bid.    Chest pain Patient reports chest pain with and without exertion and  occurring mainly when she has palpitations. Pain is described as sharp starting in the center of chest and radiating to the left. Pain is relieved with propranolol .*** -Will defer ischemic evaluation at this time.***   Hypertension BP today***. Continue diltiazem , propranolol .   Hyperlipidemia ***  ROS: All other systems reviewed  and are otherwise negative except as noted in History of Present Illness.  EKGs/Labs Reviewed        03/25/2024: ALT 6; AST 12; BUN 8; Creatinine, Ser 0.62; Potassium 4.4; Sodium 144   03/25/2024: Hemoglobin 12.6; WBC 4.3   No results found for requested labs within last 365 days.   No results found for requested labs within last 365 days.  ***  Risk Assessment/Calculations    {Does this patient have ATRIAL FIBRILLATION?:[id number]} No BP recorded.  {Refresh Note OR Click here to enter BP  :1}***        Physical Exam    VS:  There were no vitals taken for this visit. , BMI There is no height or weight on file to calculate BMI.  GEN: Well nourished, well developed, in no acute distress. Neck: No JVD or carotid bruits. Cardiac: *** RRR. *** No murmur. No rubs or gallops.   Respiratory:  Respirations regular and unlabored. Clear to auscultation without rales, wheezing or rhonchi. GI: Soft, nontender, nondistended. Extremities: Radials/DP/PT 2+ and equal bilaterally. No clubbing or cyanosis. No edema ***  Skin: Warm and dry, no rash. Neuro: Strength intact.  Assessment & Plan   ***  Disposition: ***     {Are you ordering a CV Procedure (e.g. stress test, cath, DCCV, TEE, etc)?   Press F2        :[id number]}   Signed, Barnie HERO. Shaneece Stockburger, DNP, NP-C  "

## 2024-11-24 ENCOUNTER — Ambulatory Visit: Admitting: Student

## 2024-11-26 ENCOUNTER — Encounter: Payer: Self-pay | Admitting: Oncology

## 2024-11-26 NOTE — Progress Notes (Deleted)
 "  Cardiology Clinic Note   Date: 11/26/2024 ID: Andrea Miller, DOB December 27, 1985, MRN [id number]  Primary Cardiologist:  Andrea Cave, MD  Chief Complaint   Andrea Miller is a 38 y.o. female who presents to the clinic today for ***  Patient Profile   Andrea Miller is followed by *** for the history outlined below.       Past medical history significant for: Chronic HFpEF. Echo 09/16/2018: EF 55 to 60%.  Borderline LVH.  No RWMA.  Normal diastolic parameters.  Mild RVH. Palpitations/SVT. Hypertension. Hyperlipidemia. Lipid panel 03/25/2024: LDL 138, HDL 78, TG 100, total 233. PE. OSA. Asthma. T2DM. Fibromyalgia. Lymphedema.  In summary, patient was first evaluated by Dr. Cave on 07/22/2021 to establish care for SVT.  She had previously been worked up for syncopal episode.  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  She had previously been followed by [facility] for cardiac care.  She reported lower extremity edema managed with Lasix .  She was pursuing bariatric surgery which was unfortunately delayed secondary to development of PE while on contraceptives.  Her last echo was in October 2019 and showed normal LV function as detailed above.  No medication changes were made at the time of her visit.  She was not seen in follow-up.  Patient contacted the office on 02/21/2024 with complaints of palpitations, shortness of breath, lower extremity edema.  She was instructed to present to the ED for further evaluation.   Patient was last seen in the office by me on 03/25/2024 for evaluation of increased palpitations and shortness of breath.  Patient reported chest pain, shortness of breath, palpitations, presyncope, fatigue that had been continuous for several years.  She reported being out of diltiazem  1 to 2 months.  She stated she felt palpitations were under better control with lower dose of diltiazem  twice a day  instead of a full dose once a day.  Chest pain described as sharp in the center of her chest and moving to the left with and without exertion lasting minutes to hours and coming and going sometimes over several days.  Pain sometimes occurred with palpitations and was relieved with the use of propranolol .  She reported experiencing heart racing at night and in the early morning as well as runs of SVT throughout the day.  She was managing chronic lower extremity edema with Lasix .  She reported when she has prolonged palpitation she cannot walk more than a few steps without feeling dyspneic and presyncopal.  Decision was made to defer ischemic evaluation in favor of evaluating palpitations and dyspnea.  14-day ZIO and echo were ordered but does not appear to have been completed***.       History of Present Illness    Today, patient ***  Chronic HFpEF Echo October 2019 demonstrated EF 55 to 60%, borderline LVH, normal diastolic parameters, mild RVH.  Patient reports chronic lower extremity edema she manages with lymphedema pumps and Lasix . Volume is difficult to ascertain secondary to body habitus. She has trace edema bilaterally today.  Euvolemic and well compensated on exam.*** - Continue propranolol , Lasix .***   Palpitations/PSVT Patient reports increased palpitations with associated chest pain, shortness of breath, fatigue, and presyncope. She does not know how high her HR gets when she is having palpitations.  Patient*** - Continue propranolol , diltiazem . - Decrease diltiazem  to 180 mg bid.    Chest pain Patient reports chest pain with and without exertion and  occurring mainly when she has palpitations. Pain is described as sharp starting in the center of chest and radiating to the left. Pain is relieved with propranolol .*** -Will defer ischemic evaluation at this time.***   Hypertension BP today***. Continue diltiazem , propranolol .   Hyperlipidemia ***  ROS: All other systems reviewed  and are otherwise negative except as noted in History of Present Illness.  EKGs/Labs Reviewed        03/25/2024: ALT 6; AST 12; BUN 8; Creatinine, Ser 0.62; Potassium 4.4; Sodium 144   03/25/2024: Hemoglobin 12.6; WBC 4.3   No results found for requested labs within last 365 days.   No results found for requested labs within last 365 days.  ***  Risk Assessment/Calculations    {Does this patient have ATRIAL FIBRILLATION?[phone]} No BP recorded.  {Refresh Note OR Click here to enter BP  :1}***        Physical Exam    VS:  There were no vitals taken for this visit. , BMI There is no height or weight on file to calculate BMI.  GEN: Well nourished, well developed, in no acute distress. Neck: No JVD or carotid bruits. Cardiac: *** RRR. *** No murmur. No rubs or gallops.   Respiratory:  Respirations regular and unlabored. Clear to auscultation without rales, wheezing or rhonchi. GI: Soft, nontender, nondistended. Extremities: Radials/DP/PT 2+ and equal bilaterally. No clubbing or cyanosis. No edema ***  Skin: Warm and dry, no rash. Neuro: Strength intact.  Assessment & Plan   ***  Disposition: ***     {Are you ordering a CV Procedure (e.g. stress test, cath, DCCV, TEE, etc)?   Press F2        :[id number]}   Signed, Barnie HERO. Genia Perin, DNP, NP-C  "

## 2024-12-01 ENCOUNTER — Ambulatory Visit: Attending: Student | Admitting: Student

## 2024-12-15 ENCOUNTER — Inpatient Hospital Stay: Attending: Oncology

## 2024-12-15 ENCOUNTER — Inpatient Hospital Stay: Admitting: Oncology

## 2024-12-15 ENCOUNTER — Encounter: Payer: Self-pay | Admitting: Oncology

## 2025-02-12 ENCOUNTER — Ambulatory Visit: Admitting: Internal Medicine
